# Patient Record
Sex: Male | Born: 1940 | Race: White | Hispanic: No | Marital: Married | Smoking: Never smoker
Health system: Southern US, Community
[De-identification: ages and names within clinical notes are randomized; demographics above are authoritative.]

## PROBLEM LIST (undated history)

## (undated) DIAGNOSIS — I619 Nontraumatic intracerebral hemorrhage, unspecified: Secondary | ICD-10-CM

## (undated) DIAGNOSIS — I639 Cerebral infarction, unspecified: Secondary | ICD-10-CM

## (undated) DIAGNOSIS — F329 Major depressive disorder, single episode, unspecified: Secondary | ICD-10-CM

## (undated) DIAGNOSIS — E119 Type 2 diabetes mellitus without complications: Secondary | ICD-10-CM

## (undated) DIAGNOSIS — F32A Depression, unspecified: Secondary | ICD-10-CM

## (undated) DIAGNOSIS — I10 Essential (primary) hypertension: Secondary | ICD-10-CM

## (undated) DIAGNOSIS — C801 Malignant (primary) neoplasm, unspecified: Secondary | ICD-10-CM

## (undated) DIAGNOSIS — E7800 Pure hypercholesterolemia, unspecified: Secondary | ICD-10-CM

## (undated) HISTORY — PX: PROSTATECTOMY: SHX69

## (undated) HISTORY — DX: Cerebral infarction, unspecified: I63.9

## (undated) HISTORY — PX: COLON SURGERY: SHX602

## (undated) HISTORY — PX: EYE SURGERY: SHX253

---
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
## 1970-02-22 HISTORY — PX: OTHER SURGICAL HISTORY: SHX169

## 1998-12-25 ENCOUNTER — Encounter (INDEPENDENT_AMBULATORY_CARE_PROVIDER_SITE_OTHER): Payer: Self-pay | Admitting: *Deleted

## 1999-01-14 ENCOUNTER — Encounter: Payer: Self-pay | Admitting: Family Medicine

## 1999-01-14 ENCOUNTER — Ambulatory Visit (HOSPITAL_COMMUNITY): Admission: RE | Admit: 1999-01-14 | Discharge: 1999-01-14 | Payer: Self-pay | Admitting: Family Medicine

## 2000-09-03 ENCOUNTER — Emergency Department (HOSPITAL_COMMUNITY): Admission: EM | Admit: 2000-09-03 | Discharge: 2000-09-03 | Payer: Self-pay

## 2002-03-29 ENCOUNTER — Encounter: Payer: Self-pay | Admitting: Internal Medicine

## 2009-06-18 ENCOUNTER — Inpatient Hospital Stay (HOSPITAL_COMMUNITY): Admission: EM | Admit: 2009-06-18 | Discharge: 2009-06-24 | Payer: Self-pay | Admitting: Internal Medicine

## 2009-06-18 ENCOUNTER — Encounter (INDEPENDENT_AMBULATORY_CARE_PROVIDER_SITE_OTHER): Payer: Self-pay | Admitting: *Deleted

## 2009-06-18 ENCOUNTER — Encounter: Payer: Self-pay | Admitting: Emergency Medicine

## 2009-06-18 ENCOUNTER — Ambulatory Visit: Payer: Self-pay | Admitting: Diagnostic Radiology

## 2009-06-23 ENCOUNTER — Encounter (INDEPENDENT_AMBULATORY_CARE_PROVIDER_SITE_OTHER): Payer: Self-pay | Admitting: *Deleted

## 2009-06-24 ENCOUNTER — Encounter (INDEPENDENT_AMBULATORY_CARE_PROVIDER_SITE_OTHER): Payer: Self-pay | Admitting: *Deleted

## 2009-07-09 ENCOUNTER — Encounter (INDEPENDENT_AMBULATORY_CARE_PROVIDER_SITE_OTHER): Payer: Self-pay | Admitting: *Deleted

## 2009-07-09 ENCOUNTER — Encounter: Admission: RE | Admit: 2009-07-09 | Discharge: 2009-07-09 | Payer: Self-pay | Admitting: Surgery

## 2009-07-11 ENCOUNTER — Encounter: Payer: Self-pay | Admitting: Internal Medicine

## 2009-07-28 ENCOUNTER — Telehealth: Payer: Self-pay | Admitting: Internal Medicine

## 2009-07-29 DIAGNOSIS — Z860100 Personal history of colon polyps, unspecified: Secondary | ICD-10-CM | POA: Insufficient documentation

## 2009-07-29 DIAGNOSIS — I10 Essential (primary) hypertension: Secondary | ICD-10-CM | POA: Insufficient documentation

## 2009-07-29 DIAGNOSIS — E785 Hyperlipidemia, unspecified: Secondary | ICD-10-CM | POA: Insufficient documentation

## 2009-07-29 DIAGNOSIS — J90 Pleural effusion, not elsewhere classified: Secondary | ICD-10-CM | POA: Insufficient documentation

## 2009-07-29 DIAGNOSIS — R17 Unspecified jaundice: Secondary | ICD-10-CM | POA: Insufficient documentation

## 2009-07-29 DIAGNOSIS — K5732 Diverticulitis of large intestine without perforation or abscess without bleeding: Secondary | ICD-10-CM | POA: Insufficient documentation

## 2009-07-30 ENCOUNTER — Ambulatory Visit: Payer: Self-pay | Admitting: Internal Medicine

## 2009-08-01 ENCOUNTER — Ambulatory Visit: Payer: Self-pay | Admitting: Internal Medicine

## 2009-08-04 ENCOUNTER — Encounter: Payer: Self-pay | Admitting: Internal Medicine

## 2009-08-04 ENCOUNTER — Telehealth: Payer: Self-pay | Admitting: Internal Medicine

## 2009-08-08 ENCOUNTER — Ambulatory Visit: Payer: Self-pay | Admitting: Cardiology

## 2009-08-29 ENCOUNTER — Encounter: Payer: Self-pay | Admitting: Internal Medicine

## 2010-03-26 NOTE — Progress Notes (Signed)
Summary: CT Scheduled  Phone Note Outgoing Call   Call placed by: Laureen Ochs LPN,  August 04, 2009 10:10 AM Call placed to: Patient Summary of Call: Per Dr.Kamren Heintzelman/colonoscopy report on 08-01-09, pt. needs a CT. CT scheduled at [facility] CT on 08-06-09 at 9am. Message left for patient to callback.  Initial call taken by: Laureen Ochs LPN,  August 04, 2009 10:23 AM  Follow-up for Phone Call        Above appt. information reviewed w/pt. by phone, he has the instruction sheet and contrast that was given to him after colonoscopy on 08-01-09. All instructions reviewed, no questions at this time. Pt. instructed to call back as needed.  Follow-up by: Laureen Ochs LPN,  August 04, 2009 4:19 PM     Appended Document: CT Scheduled Pt's CT has been rescheduled to 08-08-09 at 9:30am, updated w/Mrs.Wiegel by phone. Pt. instructed to call back as needed.

## 2010-03-26 NOTE — Letter (Signed)
Summary: [facility]   Imported By: Lester [facility] 09/12/2009 08:10:23  _____________________________________________________________________  External Attachment:    Type:   Image     Comment:   External Document

## 2010-03-26 NOTE — Assessment & Plan Note (Signed)
Summary: ABD. PAIN/BLACK STOOLS               DEBORAH   History of Present Illness Visit Type: new patient  Primary GI MD: Lina Sar MD Primary Provider: Marjory Lies, MD  Requesting Provider: na Chief Complaint: Right side abd pain, black tarry stools History of Present Illness:   70 yo Wmale with hx of recent acute abd. pain and inflammatory ptocess in the RLQ extending to the periduodenal area. He was hospitalized from4/27-06/24/2009  and was D/C'ed on Augmentin 875 mg by mouth two times a day x 11 days. Suspected diagnosis appendicitis, colitis, ? diverticulitis .On his last colonoscopy in 2005 no diverticuli were mentioned. He is seing Dr Daphine Deutscher in follow up and Dr Daphine Deutscher suggested to proceed with colonoscopy to evaluate the resolcing inflammation. He will also need a repeat CT scan of the abdomen at some point.   GI Review of Systems    Reports abdominal pain.     Location of  Abdominal pain: right side.    Denies acid reflux, belching, bloating, chest pain, dysphagia with liquids, dysphagia with solids, heartburn, loss of appetite, nausea, vomiting, vomiting blood, weight loss, and  weight gain.      Reports black tarry stools.     Denies anal fissure, change in bowel habit, constipation, diarrhea, diverticulosis, fecal incontinence, heme positive stool, hemorrhoids, irritable bowel syndrome, jaundice, light color stool, liver problems, rectal bleeding, and  rectal pain. Visit Type:  new patient  Referring Provider:  na Primary Care Provider:  Marjory Lies, MD   Chief Complaint:  Right side abd pain and black tarry stools.  History of Present Illness: This is a 70 year old male who comes today at the request of Dr Luretha Murphy for consideration of colonoscopy. Patient was hospitalized in late April 2011 for right lower quadrant abdominal pain. It was episodic and colicky in nature. He had a poor appetite; and, there was no relation to food. He also had some vomiting. A CT scan  of the abdomen and pelvis completed on the day of admission showed acute inflammation in the ascending colon region with several extraluminal gas bubbles. It was thought to be most likely related to diverticulitis with a perforated tic. In addition, there was thickening of the second and third portion of the duodenum which was thought to be due to duodenitis or peptic ulcer disease and related to diverticulitis. Labwork completed at the time of admission showed a lactic acid of 1.7. His INR esd 1.2, ssodium was 135, potassium 3.7, chloride 96, bicarb 28, glucose 184, BUN of 17, creatinine of 0.8, bilirubin 4.4 and the rest of LFTs within normal limits.  His lipase was 30. His white count was 16.2. His hemoglobin was 14.9. Patient's platelet count was 184 and his ANC was 14.6. Patient was started on IV Zosyn. A follow up CT scan completed on 06/23/09 showed a continued inflammatory process within the right lower quadrant. It also showed increasing extraluminal gas and fluid collections compatible with developing abscesses. There was questionable extravasated contrast lateral to the cecum and ascending colon. There was also decreasing wall thickness of the duodenum and a small right pleural effusion. Patient was put on oral Augmentin which he was on for a total of 14 days. Another repeat CT scan completed on 07/09/09 after discharge from the hospital showed iterval improvement in the inflammatory process within the right lower quadrant. There were still a few extraluminal air bubbles present within the right lower  quadrant. Patient comes today in follow up and is now doing better with the exception of some residual abdominal discomfort.    Current Medications (verified): 1)  Pravastatin Sodium 20 Mg Tabs (Pravastatin Sodium) .... Take 1 Tablet By Mouth Once A Day  Allergies (verified): No Known Drug Allergies  Past History:  Past Medical History: Reviewed history from 07/29/2009 and no changes  required. Current Problems:  Hx of EFFUSION, PLEURAL (ICD-511.9) HYPERLIPIDEMIA (ICD-272.4) HYPERBILIRUBINEMIA (ICD-782.4) DIVERTICULITIS, COLON (ICD-562.11) HYPERTENSION (ICD-401.9) COLONIC POLYPS, HYPERPLASTIC, HX OF (ICD-V12.72)    Past Surgical History: Reviewed history from 07/29/2009 and no changes required. Left wrist surgery Nasal Surgery  Family History: Reviewed history from 07/29/2009 and no changes required. No FH of Colon Cancer: Family History of Diabetes:   Social History: Self Employed Married One child Alcohol Use - no Illicit Drug Use - no Patient has never smoked.  Daily Caffeine Use: 3 daily  Smoking Status:  never  Review of Systems  The patient denies allergy/sinus, anemia, anxiety-new, arthritis/joint pain, back pain, blood in urine, breast changes/lumps, change in vision, confusion, cough, coughing up blood, depression-new, fainting, fatigue, fever, headaches-new, hearing problems, heart murmur, heart rhythm changes, itching, menstrual pain, muscle pains/cramps, night sweats, nosebleeds, pregnancy symptoms, shortness of breath, skin rash, sleeping problems, sore throat, swelling of feet/legs, swollen lymph glands, thirst - excessive , urination - excessive , urination changes/pain, urine leakage, vision changes, and voice change.         Pertinent positive and negative review of systems were noted in the above HPI. All other ROS was otherwise negative.   Vital Signs:  Patient profile:   70 year old male Height:      72 inches Weight:      240 pounds BMI:     32.67 BSA:     2.30 Pulse rate:   76 / minute Pulse rhythm:   regular BP sitting:   142 / 86  (left arm) Cuff size:   regular  Vitals Entered By: Ok Anis CMA (July 30, 2009 9:14 AM)  Physical Exam  General:  aler,t oriented and overweight. Eyes:  nonicteric. Neck:  jugular veins not distended. Lungs:  Clear throughout to auscultation. Heart:  Regular rate and rhythm; no murmurs,  rubs,  or bruits. Abdomen:  protuberant abdomen with normoactive bowel sounds. Small umbilical hernia. No tenderness. No fullness. No fluid wave. Liver edge not felt. Extremities:  2+ edema bilaterally. Skin:  Intact without significant lesions or rashes. Psych:  Alert and cooperative. Normal mood and affect.   Impression & Recommendations:  Problem # 1:  DIVERTICULITIS, COLON (ICD-562.11) Patient had an acute abdominal infection of unclear etiology; appendicitis versus diverticulitis. There is also the possibility that the infection was related to right-sided colitis. There was an extension of inflammation to the periduodenal area. Transient jaundice might have been due to metabolic causes but gallbladder disease needs to be ruled out as well. His gallbladder appeared contracted on a CT scan. He is currently asymptomatic and off antibiotics. As per Dr. Daphine Deutscher, we will proceed with a screening colonoscopy to assess the right colon. We will also repeat his liver function tests today as well as his CBC. We will consider an upper abdominal ultrasound or HIDA scan for further evaluation as well.  Problem # 2:  HYPERBILIRUBINEMIA (ICD-782.4) We will repeat LFTs today.  Problem # 3:  COLONIC POLYPS, HYPERPLASTIC, HX OF (ICD-V12.72) A followup colonoscopy has been scheduled. Orders: Colonoscopy (Colon)  Patient Instructions: 1)  scheduled colonoscopy  with MiraLax prep. 2)  High-fiber diet. 3)  CBC and metabolic panel today. 4)  Consider upper abdominal ultrasound and HIDA scan. 5)  Copy sent to : Dr M.Martin, Dr Erven Colla 6)  The medication list was reviewed and reconciled.  All changed / newly prescribed medications were explained.  A complete medication list was provided to the patient / caregiver. Prescriptions: DULCOLAX 5 MG  TBEC (BISACODYL) Day before procedure take 2 at 3pm and 2 at 8pm.  #4 x 0   Entered by:   Lamona Curl CMA (AAMA)   Authorized by:   Hart Carwin MD    Signed by:   Lamona Curl CMA (AAMA) on 07/30/2009   Method used:   Electronically to        [facility]  [place] [address] (retail)       [address]       [place], [place]  [address]       Ph: [phone] or [phone]       Fax: [phone]   RxID:   [id number] REGLAN 10 MG  TABS (METOCLOPRAMIDE HCL) As per prep instructions.  #2 x 0   Entered by:   Lamona Curl CMA (AAMA)   Authorized by:   Hart Carwin MD   Signed by:   Lamona Curl CMA (AAMA) on 07/30/2009   Method used:   [facility]ronically to        [facility]  [place] [address] (retail)       [address]       [place], [place]  [address]       Ph: [phone] or [phone]       Fax: [phone]   RxID:   [id number] MIRALAX   POWD (POLYETHYLENE GLYCOL 3350) As per prep  instructions.  #255gm x 0   Entered by:   Lamona Curl CMA (AAMA)   Authorized by:   Hart Carwin MD   Signed by:   Lamona Curl CMA (AAMA) on 07/30/2009   Method used:   [facility]ronically to        [facility]  [place] [address] (retail[facility]       [address]       [place], [place]  [address]       Ph: [phone] or [phone]       Fax: [phone]   RxID:   [id number]

## 2010-03-26 NOTE — Discharge Summary (Signed)
Summary: Acute Diverticulitis with Abscess    NAME:  Justin [name], Justin [name]                 ACCOUNT NO.:  [id number]      MEDICAL RECORD NO.:  [id number]          PATIENT TYPE:  INP      LOCATION:  1525                         FACILITY:  [facility]      PHYSICIAN:  Marinda Elk, M.D.DATE OF BIRTH:  October 07, 1940      DATE OF ADMISSION:  06/18/2009   DATE OF DISCHARGE:  06/24/2009                                  DISCHARGE SUMMARY      PRIMARY CARE DOCTOR:  Dr. Marjory Lies at [facility].      SURGEON:  Dr. Daphine Deutscher.      DISCHARGE DIAGNOSES:   1. Acute diverticulitis with abscess.   2. Hypertension.   3. Hyperbilirubinemia.      DISCHARGE MEDICATIONS:   1. Augmentin 875 mg 1 tablet b.i.d. for 11 more days.   2. Pravastatin 20 mg p.o. daily.      PROCEDURES PERFORMED:   1. On Jun 23, 2009, CT scan of the abdomen showed continued       inflammatory process in the right lower quadrant, increasing       intraluminal gas and fluid collection, compatible with developing       abscess.   2. CT scan of the abdomen and pelvis on June 18, 2009, showed acute       inflammatory ascending colon region that is most likely       diverticulitis.      BRIEF ADMITTING HISTORY AND PHYSICAL:  This is a 70 year old male with   past medical history of hyperlipidemia, who presented to the ER   complaining of pain.  The patient stated that he has had abdominal pain   for the last 4 days, which started Sunday in the right lower quadrant   that is episodic in nature and colicky.  He has had a poor appetite.  He   has only eaten and drank minimally for the past 4 days.  He did throw up   a total of three times, but that has gotten better.  He has had loose   stools for the past 2 days.  He was started yesterday on antibiotic by   his primary care physician and before that he was not on any antibiotic.   In addition, the patient states that his urine has become orange.  So he   comes to the ED.   The ED consultants will further evaluate him.      PHYSICAL EXAMINATION:  VITAL SIGNS:  Blood pressure 160/70, pulse of 99,   respiration 18.  He was saturating 97% on room air.   HEENT:  No pallor.  Dry mucous membranes.  He has cleared jaundice.  No   discharge from his eyes or ears.  Tongue is midline and dry.   CHEST:  Good air movement.  Clear to auscultation.   CARDIOVASCULAR:  Regular rate and rhythm with a positive S1-S2.  No   murmurs, rubs or gallops.   LUNGS:  He has good air movement and  clear to auscultation.   ABDOMEN:  Positive bowel sounds.  He is mildly tender in the right lower   quadrant, right upper quadrant and left upper quadrant.  No guarding or   rigidity.  NEUROLOGICAL:  Complete nonfocal.      LABORATORY DATA:  On admission, showed lactic acid of 1.7.  His INR is   1.2.  His sodium was 135, potassium 3.7, chloride 96, bicarb 28, glucose   of 184, BUN of 17, creatinine of 0.8, bilirubin 4.4 and the rest of LFTs   within normal.  Lipase of 30.  His white count was 16.2.  His hemoglobin   was 14.9.  His platelet count 184.  His ANC is 14.6.  His UA showed 0-2   red blood cells and white blood cells.  CT scan results above.      HOSPITAL COURSE:   1. Acute diverticulitis with developing abscess.  He was admitted to       the floor.  He was started on IV antibiotic Zosyn.  Surgery saw       him.  As the patient was improving with IV antibiotics and       hydration, they recommended to follow closely.  A repeat CT scan       was done with results as above.  He will continue antibiotics for       11 more days.  Surgery recommended to follow up with him as an       outpatient.  The patient was eating and drinking and walking       around, feeling better.  So he was discharged in stable condition.   2. Hypertension, currently slightly high.  The patient will have to       follow-up with his primary care doctor.   3. Hyperbilirubinemia.  This is probably secondary to  vomiting and not       eating.  This decreased with IV hydration and no treatment.      DISPOSITION:  The patient will follow-up with his primary care doctor in   2 weeks.  Here, we will check his blood pressure and start blood   pressure medication if needed.  He will also follow up with Dr. Daphine Deutscher   on Jul 04, 2009, at 1:50 p.m. to follow-up on this episode.      VITALS ON DAY OF DISCHARGE:  Temperature 98, pulse of 89, respiration   20, blood pressure 162/82.  He was saturating 98% on room air.      LABORATORY DATA ON DISCHARGE:  Acute hepatitis panel was negative.               Marinda Elk, M.D.            AF/MEDQ  D:  06/24/2009  T:  06/24/2009  Job:  [id number]      cc:   Marjory Lies, M.D.   Fax: [phone]      Daphine Deutscher, MD      Electronically Signed by Lambert Keto M.D. on 06/24/2009 02:50:59 PM

## 2010-03-26 NOTE — Letter (Signed)
Summary: [facility] Surgery   Imported By: Sherian Rein 07/30/2009 09:24:02  _____________________________________________________________________  External Attachment:    Type:   Image     Comment:   External Document

## 2010-03-26 NOTE — Procedures (Signed)
Summary: Colonoscopy  Patient: Justin [name] Note: All result statuses are Final unless otherwise noted.  Tests: (1) Colonoscopy (COL)   COL Colonoscopy           DONE     [facility]     [address]     [place], [place]  [address]           COLONOSCOPY PROCEDURE REPORT           PATIENT:  Justin [name], Justin [name]  MR#:  [id number]     BIRTHDATE:  Nov 28, 1940, 68 yrs. old  GENDER:  male     ENDOSCOPIST:  Hedwig Morton. Juanda Chance, MD     REF. BY:  Luretha Murphy, M.D.     PROCEDURE DATE:  08/01/2009     PROCEDURE:  Colonoscopy 41324     ASA CLASS:  Class II     INDICATIONS:  s/p acute inflammatory process in RLQ, CT scan with     question of perforated appendix, diverticulum or colitis, s/p IV     and oral antibiotics     last colon 2005 was normal     MEDICATIONS:   Versed 6 mg, Fentanyl 75 mcg           DESCRIPTION OF PROCEDURE:   After the risks benefits and     alternatives of the procedure were thoroughly explained, informed     consent was obtained.  Digital rectal exam was performed and     revealed no rectal masses.   The LB [id number] endoscope     was introduced through the anus and advanced to the ileum, without     limitations.  The quality of the prep was good, using MiraLax.     The instrument was then slowly withdrawn as the colon was fully     examined.     <<PROCEDUREIMAGES>>           FINDINGS:  Nodular mucosa was found in the cecum. small area of     nodularity distal to the ileocecal valve appears fiable and bleeds     easily when biopsies, it consists of granulation tissue,     the ileocecal valve and TI are normal, With standard forceps,     biopsy was obtained and sent to pathology (see image8 and     image10).  This was otherwise a normal examination of the colon     (see image11, image7, image6, image5, image4, image3, image1, and     image2). no diverticuli left or right colon   Retroflexed views in     the rectum revealed no abnormalities.    The scope  was then     withdrawn from the patient and the procedure completed.           COMPLICATIONS:  None     ENDOSCOPIC IMPRESSION:     1) Nodular mucosa in the cecum     2) Otherwise normal examination     probable site of perforation or abcess just distal from the     ileocecal valve, healed ulcerated mucosa, no evidence of colitis,     Ileocecal valve and TI are normal as well as an appendiceal     opening     RECOMMENDATIONS:     1) Await biopsy results     follow up CT scan of the abdomen     follow up with Dr Daphine Deutscher     REPEAT EXAM:  In 10 year(s) for.  ______________________________     Hedwig Morton. Juanda Chance, MD           CC:           n.     eSIGNED:   Hedwig Morton. Shunte Senseney at 08/01/2009 10:06 AM           Page 2 of 3   [place], [place], [id number]  Note: An exclamation mark (!) indicates a result that was not dispersed into the flowsheet. Document Creation Date: 08/01/2009 10:07 AM _______________________________________________________________________  (1) Order result status: Final Collection or observation date-time: 08/01/2009 09:50 Requested date-time:  Receipt date-time:  Reported date-time:  Referring Physician:   Ordering Physician: Lina Sar [phone]) Specimen Source:  Source: Launa Grill Order Number: [id number] Lab site:   Appended Document: Colonoscopy     Procedures Next Due Date:    Colonoscopy: 07/2019

## 2010-03-26 NOTE — Letter (Signed)
Summary: Patient Notice- Polyp Results  [facility]  [address] [place], [place] [address]   Phone: [phone]  Fax: [phone]        August 04, 2009 MRN: [id number]    Justin [name] [address] [place], [place]  [address]    Dear [name],  I am pleased to inform you that the colon polyp(s) removed during your recent colonoscopy was (were) found to be benign (no cancer detected) upon pathologic examination.The polyp consisted of inflamed tissue consistent with a healing process.  I recommend you have a repeat colonoscopy examination in 10_ years to look for recurrent polyps, as having colon polyps increases your risk for having recurrent polyps or even colon cancer in the future.  Should you develop new or worsening symptoms of abdominal pain, bowel habit changes or bleeding from the rectum or bowels, please schedule an evaluation with either your primary care physician or with me.  Additional information/recommendations:  __ No further action with gastroenterology is needed at this time. Please      follow-up with your primary care physician for your other healthcare      needs.  __ Please call [phone] to schedule a return visit to review your      situation.  _x_ Please keep your follow-up visit with Dr Daphine Deutscher Please  keep Your appointment for CT scan. __ Continue treatment plan as outlined the day of your exam.  Please call us if you are having persistent problems or have questions about your condition that have not been fully answered at this time.  Sincerely,  Hart Carwin MD  This letter has been electronically signed by your physician.  Appended Document: Patient Notice- Polyp Results letter mailed 6.16.11

## 2010-03-26 NOTE — Letter (Signed)
Summary: [facility] Instructions  [facility]  [address] [place], [place] [address]   Phone: [phone]  Fax: [phone]       Justin [name]    Dec 04, 1940    MRN: [id number]       Procedure Day /Date: 08/01/09 Friday     Arrival Time: 8:00 am     Procedure Time: 9:00 am     Location of Procedure:                    _x_  [facility] (4th Floor)  PREPARATION FOR COLONOSCOPY WITH MIRALAX  Starting 5 days prior to your procedure (today) do not eat nuts, seeds, popcorn, corn, beans, peas,  salads, or any raw vegetables.  Do not take any fiber supplements (e.g. Metamucil, Citrucel, and Benefiber). ____________________________________________________________________________________________________   THE DAY BEFORE YOUR PROCEDURE         DATE: 07/31/09 DAY: Thursday  1   Drink clear liquids the entire day-NO SOLID FOOD  2   Do not drink anything colored red or purple.  Avoid juices with pulp.  No orange juice.  3   Drink at least 64 oz. (8 glasses) of fluid/clear liquids during the day to prevent dehydration and help the prep work efficiently.  CLEAR LIQUIDS INCLUDE: Water Jello Ice Popsicles Tea (sugar ok, no milk/cream) Powdered fruit flavored drinks Coffee (sugar ok, no milk/cream) Gatorade Juice: apple, white grape, white cranberry  Lemonade Clear bullion, consomm, broth Carbonated beverages (any kind) Strained chicken noodle soup Hard Candy  4   Mix the entire bottle of Miralax with 64 oz. of Gatorade/Powerade in the morning and put in the refrigerator to chill.  5   At 3:00 pm take 2 Dulcolax/Bisacodyl tablets.  6   At 4:30 pm take one Reglan/Metoclopramide tablet.  7  Starting at 5:00 pm drink one 8 oz glass of the Miralax mixture every 15-20 minutes until you have finished drinking the entire 64 oz.  You should finish drinking prep around 7:30 or 8:00 pm.  8   If you are nauseated, you may take the 2nd Reglan/Metoclopramide tablet at  6:30 pm.        9    At 8:00 pm take 2 more DULCOLAX/Bisacodyl tablets.         THE DAY OF YOUR PROCEDURE      DATE:  08/01/09 DAY: Friday  You may drink clear liquids until 7:00 am  (2 HOURS BEFORE PROCEDURE).   MEDICATION INSTRUCTIONS  Unless otherwise instructed, you should take regular prescription medications with a small sip of water as early as possible the morning of your procedure.       OTHER INSTRUCTIONS  You will need a responsible adult at least 68 years of age to accompany you and drive you home.   This person must remain in the waiting room during your procedure.  Wear loose fitting clothing that is easily removed.  Leave jewelry and other valuables at home.  However, you may wish to bring a book to read or an iPod/MP3 player to listen to music as you wait for your procedure to start.  Remove all body piercing jewelry and leave at home.  Total time from sign-in until discharge is approximately 2-3 hours.  You should go home directly after your procedure and rest.  You can resume normal activities the day after your procedure.  The day of your procedure you should not:   Drive   Make legal  decisions   Operate machinery   Drink alcohol   Return to work  You will receive specific instructions about eating, activities and medications before you leave.   The above instructions have been reviewed and explained to me by   _______________________    I fully understand and can verbalize these instructions _____________________________ Date 07/30/09

## 2010-03-26 NOTE — Progress Notes (Signed)
Summary: Appt. Scheduled  Phone Note Outgoing Call   Call placed by: Laureen Ochs LPN,  July 28, 1608 10:14 AM Call placed to: Patient Summary of Call: Per Dr.Mat Martin's request/Dr.Lakendrick Paradis, pt. needs a colonoscopy. Message left for patient to callback.  Initial call taken by: Laureen Ochs LPN,  July 29, 9602 10:15 AM  Follow-up for Phone Call        Pt. was at [facility] last month for black stools/abd. pain.  He will see Dr.Briane Birden on 07-30-09 at 8:45am. Follow-up by: Laureen Ochs LPN,  July 30, 5407 8:13 AM

## 2010-03-26 NOTE — Procedures (Signed)
Summary: COLONOSCOPY   Colonoscopy  Procedure date:  03/29/2002  Findings:      Location:  [facility].    Patient Name: Justin [name], Justin [name] MRN:  Procedure Procedures: Colonoscopy CPT: 21308.    with Hot Biopsy(s)CPT: Z451292.  Personnel: Endoscopist: Dora L. Juanda Chance, MD.  Referred By: Corinna Capra, MD.  Exam Location: Exam performed in Outpatient Clinic. Outpatient  Patient Consent: Procedure, Alternatives, Risks and Benefits discussed, consent obtained, from patient. Consent was obtained by the RN.  Indications  Average Risk Screening Routine.  History  Pre-Exam Physical: Performed Mar 29, 2002. Cardio-pulmonary exam, Rectal exam, HEENT exam , Abdominal exam, Extremity exam, Neurological exam, Mental status exam WNL.  Exam Exam: Extent of exam reached: Cecum, extent intended: Cecum.  The cecum was identified by appendiceal orifice and IC valve. Colon retroflexion performed. Images taken. ASA Classification: I. Tolerance: good.  Monitoring: Pulse and BP monitoring, Oximetry used. Supplemental O2 given.  Sedation Meds: Patient assessed and found to be appropriate for moderate (conscious) sedation. Fentanyl 100 mcg. given IV. Versed 9 mg. given IV.  Findings POLYP: Sigmoid Colon, Maximum size: 4 mm. diminutive, sessile polyp. Distance from Anus 45 cm. Procedure:  hot biopsy, removed, retrieved, Polyp sent to pathology. ICD9: Colon Polyps: 211.3.   Assessment Abnormal examination, see findings above.  Diagnoses: 211.3: Colon Polyps.   Comments: s/p polypectomy Events  Unplanned Interventions: No intervention was required.  Unplanned Events: There were no complications. Plans  Post Exam Instructions: No aspirin or non-steroidal containing medications: 2 weeks.  Medication Plan: Await pathology.  Patient Education: Patient given standard instructions for: Yearly hemoccult testing recommended. Patient instructed to get routine colonoscopy  every 5 years.  Disposition: After procedure patient sent to recovery. After recovery patient sent home.       CC: Kipp Brood A.Burnette,MD  This report was created from the original endoscopy report, which was reviewed and signed by the above listed endoscopist.   Appended Document: COLONOSCOPY Path report : Hyperplastic polyp

## 2010-04-06 ENCOUNTER — Ambulatory Visit (HOSPITAL_BASED_OUTPATIENT_CLINIC_OR_DEPARTMENT_OTHER)
Admission: RE | Admit: 2010-04-06 | Discharge: 2010-04-06 | Disposition: A | Discharge status: Home / Self Care | Payer: Medicare Other | Source: Ambulatory Visit | Attending: Urology | Admitting: Urology

## 2010-04-06 DIAGNOSIS — C61 Malignant neoplasm of prostate: Secondary | ICD-10-CM | POA: Insufficient documentation

## 2010-04-06 DIAGNOSIS — Z01810 Encounter for preprocedural cardiovascular examination: Secondary | ICD-10-CM | POA: Insufficient documentation

## 2010-04-06 DIAGNOSIS — N210 Calculus in bladder: Secondary | ICD-10-CM | POA: Insufficient documentation

## 2010-04-06 DIAGNOSIS — N35919 Unspecified urethral stricture, male, unspecified site: Secondary | ICD-10-CM | POA: Insufficient documentation

## 2010-04-06 DIAGNOSIS — Z01812 Encounter for preprocedural laboratory examination: Secondary | ICD-10-CM | POA: Insufficient documentation

## 2010-04-06 LAB — POCT HEMOGLOBIN-HEMACUE: Hemoglobin: 16.5 g/dL (ref 13.0–17.0)

## 2010-04-13 NOTE — Op Note (Signed)
  NAMEJALEEL, ALLEN NO.:  [id number]  MEDICAL RECORD NO.:  [id number]          PATIENT TYPE:  LOCATION:                                 FACILITY:  PHYSICIAN:  Valetta Fuller, M.D.  DATE OF BIRTH:  03-10-1940  DATE OF PROCEDURE: DATE OF DISCHARGE:                              OPERATIVE REPORT   PREOPERATIVE DIAGNOSES: 1. Urethral stricture disease. 2. Prostate cancer.  POSTOPERATIVE DIAGNOSES: 1. Urethral stricture disease. 2. Prostate cancer. 3. Bladder calculus.  PROCEDURE PERFORMED:  Cystoscopy, balloon dilation of urethral stricture, and removal of bladder stone.  SURGEON:  Valetta Fuller, MD  ANESTHESIA:  General.  INDICATIONS:  Mr. Harker is 70 years of age.  He has a remote history of urethral stricture disease after developing near urinary retention.  At that time, he was felt to have a very tight but short bulbar urethral stricture which was dilated in the office with good results.  The patient recently had an elevated PSA.  He was complaining of increased voiding difficulties.  He underwent a biopsy which unfortunately did reveal adenocarcinoma of the prostate.  The patient subsequently developed near urinary retention and required minimal urethral dilation in the office with Foley catheter placement.  He presents now for reassessment along with more formal dilation of his bulbar urethral stricture and assessment of his prostate and bladder.  TECHNIQUE AND FINDINGS:  The patient was brought to the operating room. His Foley catheter was removed.  He has been taking oral antibiotics starting several days before the procedure.  He had successful induction of general anesthesia, was placed in lithotomy position and prepped and draped in the usual manner.  Appropriate surgical time-out was performed.  Cystoscopy revealed narrowing in his bulbar urethra.  This was approximately 2 cm distal to his external urethral sphincter.  This area  appeared to be quite short in length.  With fluoroscopic guidance, we placed a guidewire through this area to the bladder.  A 24-French fascia dilating balloon was then inflated to 16 atmospheres of pressure for approximately 5 minutes.  The balloon was then removed.  Cystoscopy then revealed good resolution of the area of narrowing and easily allow for passes of the cystoscope.  The prostatic urethra was relatively short measuring 3.5 cm with some mild trilobar hyperplasia but not much in a way of visual obstruction.  The bladder showed moderate trabecular change and approximately a 5-6 mm stone which was removed.  We felt the patient did not require repeat Foley catheter insertion and lidocaine jelly was instilled and he was brought to recovery room in stable condition.     Valetta Fuller, M.D.     DSG/MEDQ  D:  04/06/2010  T:  04/06/2010  Job:  [id number]  Electronically Signed by Barron Alvine M.D. on 04/13/2010 12:02:42 PM

## 2010-05-12 LAB — DIFFERENTIAL
Basophils Absolute: 0 10*3/uL (ref 0.0–0.1)
Basophils Relative: 0 % (ref 0–1)
Eosinophils Absolute: 0 10*3/uL (ref 0.0–0.7)
Eosinophils Relative: 0 % (ref 0–5)
Lymphocytes Relative: 5 % — ABNORMAL LOW (ref 12–46)
Lymphs Abs: 0.8 10*3/uL (ref 0.7–4.0)
Monocytes Absolute: 0.8 10*3/uL (ref 0.1–1.0)
Monocytes Relative: 5 % (ref 3–12)
Neutro Abs: 14.6 10*3/uL — ABNORMAL HIGH (ref 1.7–7.7)
Neutrophils Relative %: 90 % — ABNORMAL HIGH (ref 43–77)

## 2010-05-12 LAB — CBC
HCT: 37 % — ABNORMAL LOW (ref 39.0–52.0)
HCT: 39.3 % (ref 39.0–52.0)
HCT: 40.4 % (ref 39.0–52.0)
HCT: 40.8 % (ref 39.0–52.0)
HCT: 43.5 % (ref 39.0–52.0)
Hemoglobin: 12.7 g/dL — ABNORMAL LOW (ref 13.0–17.0)
Hemoglobin: 13.3 g/dL (ref 13.0–17.0)
Hemoglobin: 13.7 g/dL (ref 13.0–17.0)
Hemoglobin: 14 g/dL (ref 13.0–17.0)
Hemoglobin: 14.9 g/dL (ref 13.0–17.0)
MCHC: 34.2 g/dL (ref 30.0–36.0)
MCHC: 33.9 g/dL (ref 30.0–36.0)
MCHC: 34 g/dL (ref 30.0–36.0)
MCHC: 34.4 g/dL (ref 30.0–36.0)
MCHC: 34.2 g/dL (ref 30.0–36.0)
MCV: 85 fL (ref 78.0–100.0)
MCV: 84.8 fL (ref 78.0–100.0)
MCV: 85.1 fL (ref 78.0–100.0)
MCV: 85.4 fL (ref 78.0–100.0)
MCV: 83.9 fL (ref 78.0–100.0)
Platelets: 300 10*3/uL (ref 150–400)
Platelets: 206 10*3/uL (ref 150–400)
Platelets: 240 10*3/uL (ref 150–400)
Platelets: 356 10*3/uL (ref 150–400)
Platelets: 184 10*3/uL (ref 150–400)
RBC: 4.36 MIL/uL (ref 4.22–5.81)
RBC: 4.6 MIL/uL (ref 4.22–5.81)
RBC: 4.77 MIL/uL (ref 4.22–5.81)
RBC: 4.79 MIL/uL (ref 4.22–5.81)
RBC: 5.19 MIL/uL (ref 4.22–5.81)
RDW: 12.8 % (ref 11.5–15.5)
RDW: 12.8 % (ref 11.5–15.5)
RDW: 12.9 % (ref 11.5–15.5)
RDW: 13.4 % (ref 11.5–15.5)
RDW: 12.3 % (ref 11.5–15.5)
WBC: 14.9 10*3/uL — ABNORMAL HIGH (ref 4.0–10.5)
WBC: 12.7 10*3/uL — ABNORMAL HIGH (ref 4.0–10.5)
WBC: 14.2 10*3/uL — ABNORMAL HIGH (ref 4.0–10.5)
WBC: 17.7 10*3/uL — ABNORMAL HIGH (ref 4.0–10.5)
WBC: 16.2 10*3/uL — ABNORMAL HIGH (ref 4.0–10.5)

## 2010-05-12 LAB — GLUCOSE, CAPILLARY
Glucose-Capillary: 104 mg/dL — ABNORMAL HIGH (ref 70–99)
Glucose-Capillary: 110 mg/dL — ABNORMAL HIGH (ref 70–99)
Glucose-Capillary: 117 mg/dL — ABNORMAL HIGH (ref 70–99)
Glucose-Capillary: 119 mg/dL — ABNORMAL HIGH (ref 70–99)
Glucose-Capillary: 120 mg/dL — ABNORMAL HIGH (ref 70–99)
Glucose-Capillary: 136 mg/dL — ABNORMAL HIGH (ref 70–99)
Glucose-Capillary: 145 mg/dL — ABNORMAL HIGH (ref 70–99)
Glucose-Capillary: 152 mg/dL — ABNORMAL HIGH (ref 70–99)
Glucose-Capillary: 160 mg/dL — ABNORMAL HIGH (ref 70–99)
Glucose-Capillary: 89 mg/dL (ref 70–99)
Glucose-Capillary: 92 mg/dL (ref 70–99)
Glucose-Capillary: 92 mg/dL (ref 70–99)
Glucose-Capillary: 99 mg/dL (ref 70–99)
Glucose-Capillary: 103 mg/dL — ABNORMAL HIGH (ref 70–99)
Glucose-Capillary: 106 mg/dL — ABNORMAL HIGH (ref 70–99)
Glucose-Capillary: 107 mg/dL — ABNORMAL HIGH (ref 70–99)
Glucose-Capillary: 110 mg/dL — ABNORMAL HIGH (ref 70–99)
Glucose-Capillary: 111 mg/dL — ABNORMAL HIGH (ref 70–99)
Glucose-Capillary: 112 mg/dL — ABNORMAL HIGH (ref 70–99)
Glucose-Capillary: 113 mg/dL — ABNORMAL HIGH (ref 70–99)
Glucose-Capillary: 114 mg/dL — ABNORMAL HIGH (ref 70–99)
Glucose-Capillary: 118 mg/dL — ABNORMAL HIGH (ref 70–99)
Glucose-Capillary: 119 mg/dL — ABNORMAL HIGH (ref 70–99)
Glucose-Capillary: 124 mg/dL — ABNORMAL HIGH (ref 70–99)
Glucose-Capillary: 156 mg/dL — ABNORMAL HIGH (ref 70–99)
Glucose-Capillary: 159 mg/dL — ABNORMAL HIGH (ref 70–99)
Glucose-Capillary: 162 mg/dL — ABNORMAL HIGH (ref 70–99)
Glucose-Capillary: 81 mg/dL (ref 70–99)
Glucose-Capillary: 83 mg/dL (ref 70–99)
Glucose-Capillary: 90 mg/dL (ref 70–99)
Glucose-Capillary: 93 mg/dL (ref 70–99)
Glucose-Capillary: 95 mg/dL (ref 70–99)

## 2010-05-12 LAB — HEPATITIS PANEL, ACUTE
HCV Ab: NEGATIVE
Hep A IgM: NEGATIVE
Hep B C IgM: NEGATIVE
Hepatitis B Surface Ag: NEGATIVE

## 2010-05-12 LAB — CULTURE, BLOOD (ROUTINE X 2)
Culture: NO GROWTH
Culture: NO GROWTH

## 2010-05-12 LAB — URINE CULTURE
Colony Count: NO GROWTH
Colony Count: NO GROWTH
Culture: NO GROWTH
Culture: NO GROWTH
Special Requests: NEGATIVE

## 2010-05-12 LAB — LACTIC ACID, PLASMA: Lactic Acid, Venous: 1.7 mmol/L (ref 0.5–2.2)

## 2010-05-12 LAB — URINALYSIS, ROUTINE W REFLEX MICROSCOPIC
Glucose, UA: NEGATIVE mg/dL
Ketones, ur: 15 mg/dL — AB
Nitrite: NEGATIVE
Protein, ur: 30 mg/dL — AB
Specific Gravity, Urine: 1.028 (ref 1.005–1.030)
Urobilinogen, UA: 4 mg/dL — ABNORMAL HIGH (ref 0.0–1.0)
pH: 6 (ref 5.0–8.0)

## 2010-05-12 LAB — COMPREHENSIVE METABOLIC PANEL
ALT: 34 U/L (ref 0–53)
ALT: 36 U/L (ref 0–53)
AST: 30 U/L (ref 0–37)
AST: 26 U/L (ref 0–37)
Albumin: 2.5 g/dL — ABNORMAL LOW (ref 3.5–5.2)
Albumin: 3.5 g/dL (ref 3.5–5.2)
Alkaline Phosphatase: 75 U/L (ref 39–117)
Alkaline Phosphatase: 90 U/L (ref 39–117)
BUN: 20 mg/dL (ref 6–23)
BUN: 17 mg/dL (ref 6–23)
CO2: 28 mEq/L (ref 19–32)
CO2: 28 mEq/L (ref 19–32)
Calcium: 8 mg/dL — ABNORMAL LOW (ref 8.4–10.5)
Calcium: 8.5 mg/dL (ref 8.4–10.5)
Chloride: 106 mEq/L (ref 96–112)
Chloride: 96 mEq/L (ref 96–112)
Creatinine, Ser: 0.79 mg/dL (ref 0.4–1.5)
Creatinine, Ser: 0.8 mg/dL (ref 0.4–1.5)
GFR calc Af Amer: >60 mL/min (ref >60)
GFR calc Af Amer: >60 mL/min (ref >60)
GFR calc non Af Amer: >60 mL/min (ref >60)
GFR calc non Af Amer: >60 mL/min (ref >60)
Glucose, Bld: 125 mg/dL — ABNORMAL HIGH (ref 70–99)
Glucose, Bld: 184 mg/dL — ABNORMAL HIGH (ref 70–99)
Potassium: 4.3 mEq/L (ref 3.5–5.1)
Potassium: 3.7 mEq/L (ref 3.5–5.1)
Sodium: 140 mEq/L (ref 135–145)
Sodium: 135 mEq/L (ref 135–145)
Total Bilirubin: 0.9 mg/dL (ref 0.3–1.2)
Total Bilirubin: 4.4 mg/dL — ABNORMAL HIGH (ref 0.3–1.2)
Total Protein: 6.3 g/dL (ref 6.0–8.3)
Total Protein: 7 g/dL (ref 6.0–8.3)

## 2010-05-12 LAB — STOOL CULTURE

## 2010-05-12 LAB — BASIC METABOLIC PANEL
BUN: 14 mg/dL (ref 6–23)
BUN: 16 mg/dL (ref 6–23)
CO2: 28 mEq/L (ref 19–32)
CO2: 29 mEq/L (ref 19–32)
Calcium: 8.1 mg/dL — ABNORMAL LOW (ref 8.4–10.5)
Calcium: 8.1 mg/dL — ABNORMAL LOW (ref 8.4–10.5)
Chloride: 101 mEq/L (ref 96–112)
Chloride: 105 mEq/L (ref 96–112)
Creatinine, Ser: 0.78 mg/dL (ref 0.4–1.5)
Creatinine, Ser: 0.81 mg/dL (ref 0.4–1.5)
GFR calc Af Amer: >60 mL/min (ref >60)
GFR calc Af Amer: >60 mL/min (ref >60)
GFR calc non Af Amer: >60 mL/min (ref >60)
GFR calc non Af Amer: >60 mL/min (ref >60)
Glucose, Bld: 112 mg/dL — ABNORMAL HIGH (ref 70–99)
Glucose, Bld: 148 mg/dL — ABNORMAL HIGH (ref 70–99)
Potassium: 3.6 mEq/L (ref 3.5–5.1)
Potassium: 3.8 mEq/L (ref 3.5–5.1)
Sodium: 135 mEq/L (ref 135–145)
Sodium: 139 mEq/L (ref 135–145)

## 2010-05-12 LAB — CLOSTRIDIUM DIFFICILE EIA
C difficile Toxins A+B, EIA: NEGATIVE
C difficile Toxins A+B, EIA: NEGATIVE

## 2010-05-12 LAB — OVA AND PARASITE EXAMINATION: Ova and parasites: NONE SEEN

## 2010-05-12 LAB — HEMOGLOBIN A1C
Hgb A1c MFr Bld: 6 % — ABNORMAL HIGH (ref <5.7)
Mean Plasma Glucose: 126 mg/dL — ABNORMAL HIGH (ref <117)

## 2010-05-12 LAB — URINE MICROSCOPIC-ADD ON

## 2010-05-12 LAB — LIPASE, BLOOD: Lipase: 30 U/L (ref 23–300)

## 2010-05-12 LAB — TSH: TSH: 5.221 u[IU]/mL — ABNORMAL HIGH (ref 0.350–4.500)

## 2010-05-12 LAB — PROTIME-INR
INR: 1.21 (ref 0.00–1.49)
Prothrombin Time: 15.2 seconds (ref 11.6–15.2)

## 2010-05-22 ENCOUNTER — Ambulatory Visit (HOSPITAL_COMMUNITY)
Admission: RE | Admit: 2010-05-22 | Discharge: 2010-05-22 | Disposition: A | Discharge status: Home / Self Care | Payer: Medicare Other | Source: Ambulatory Visit | Attending: Urology | Admitting: Urology

## 2010-05-22 ENCOUNTER — Encounter (HOSPITAL_COMMUNITY): Payer: Medicare Other

## 2010-05-22 ENCOUNTER — Other Ambulatory Visit: Payer: Self-pay | Admitting: Urology

## 2010-05-22 ENCOUNTER — Other Ambulatory Visit (HOSPITAL_COMMUNITY): Payer: Self-pay | Admitting: Urology

## 2010-05-22 ENCOUNTER — Other Ambulatory Visit (HOSPITAL_COMMUNITY): Payer: PRIVATE HEALTH INSURANCE

## 2010-05-22 DIAGNOSIS — Z01812 Encounter for preprocedural laboratory examination: Secondary | ICD-10-CM | POA: Insufficient documentation

## 2010-05-22 DIAGNOSIS — Z01811 Encounter for preprocedural respiratory examination: Secondary | ICD-10-CM

## 2010-05-22 DIAGNOSIS — Z01818 Encounter for other preprocedural examination: Secondary | ICD-10-CM | POA: Insufficient documentation

## 2010-05-22 DIAGNOSIS — Z01810 Encounter for preprocedural cardiovascular examination: Secondary | ICD-10-CM | POA: Insufficient documentation

## 2010-05-22 LAB — BASIC METABOLIC PANEL
BUN: 13 mg/dL (ref 6–23)
CO2: 30 mEq/L (ref 19–32)
Calcium: 9.1 mg/dL (ref 8.4–10.5)
Chloride: 104 mEq/L (ref 96–112)
Creatinine, Ser: 0.85 mg/dL (ref 0.4–1.5)
GFR calc Af Amer: >60 mL/min (ref >60)
GFR calc non Af Amer: >60 mL/min (ref >60)
Glucose, Bld: 120 mg/dL — ABNORMAL HIGH (ref 70–99)
Potassium: 4.4 mEq/L (ref 3.5–5.1)
Sodium: 141 mEq/L (ref 135–145)

## 2010-05-22 LAB — SURGICAL PCR SCREEN
MRSA, PCR: NEGATIVE
Staphylococcus aureus: NEGATIVE

## 2010-05-22 LAB — CBC
HCT: 45.6 % (ref 39.0–52.0)
Hemoglobin: 15.3 g/dL (ref 13.0–17.0)
MCH: 27.9 pg (ref 26.0–34.0)
MCHC: 33.6 g/dL (ref 30.0–36.0)
MCV: 83.2 fL (ref 78.0–100.0)
Platelets: 193 10*3/uL (ref 150–400)
RBC: 5.48 MIL/uL (ref 4.22–5.81)
RDW: 12.9 % (ref 11.5–15.5)
WBC: 9.3 10*3/uL (ref 4.0–10.5)

## 2010-06-01 ENCOUNTER — Inpatient Hospital Stay (HOSPITAL_COMMUNITY)
Admission: RE | Admit: 2010-06-01 | Discharge: 2010-06-02 | DRG: 708 | Disposition: A | Discharge status: Home / Self Care | Payer: Medicare Other | Source: Ambulatory Visit | Attending: Urology | Admitting: Urology

## 2010-06-01 ENCOUNTER — Other Ambulatory Visit: Payer: Self-pay | Admitting: Urology

## 2010-06-01 DIAGNOSIS — C61 Malignant neoplasm of prostate: Principal | ICD-10-CM | POA: Diagnosis present

## 2010-06-01 DIAGNOSIS — Z01812 Encounter for preprocedural laboratory examination: Secondary | ICD-10-CM

## 2010-06-01 DIAGNOSIS — N35919 Unspecified urethral stricture, male, unspecified site: Secondary | ICD-10-CM | POA: Diagnosis present

## 2010-06-01 DIAGNOSIS — I10 Essential (primary) hypertension: Secondary | ICD-10-CM | POA: Diagnosis present

## 2010-06-01 DIAGNOSIS — Z01811 Encounter for preprocedural respiratory examination: Secondary | ICD-10-CM

## 2010-06-01 DIAGNOSIS — K429 Umbilical hernia without obstruction or gangrene: Secondary | ICD-10-CM | POA: Diagnosis present

## 2010-06-01 DIAGNOSIS — Z79899 Other long term (current) drug therapy: Secondary | ICD-10-CM

## 2010-06-01 LAB — BASIC METABOLIC PANEL: Glucose, Bld: 148 mg/dL — ABNORMAL HIGH (ref 70–99)

## 2010-06-01 LAB — CBC
HCT: 47.2 % (ref 39.0–52.0)
Hemoglobin: 16.1 g/dL (ref 13.0–17.0)
MCH: 28.4 pg (ref 26.0–34.0)
MCHC: 34.1 g/dL (ref 30.0–36.0)
MCV: 83.2 fL (ref 78.0–100.0)
Platelets: ADEQUATE 10*3/uL (ref 150–400)
RBC: 5.67 MIL/uL (ref 4.22–5.81)
RDW: 12.9 % (ref 11.5–15.5)
WBC: 19.1 10*3/uL — ABNORMAL HIGH (ref 4.0–10.5)

## 2010-06-01 LAB — TYPE AND SCREEN
ABO/RH(D): A POS
Antibody Screen: NEGATIVE

## 2010-06-01 LAB — ABO/RH: ABO/RH(D): A POS

## 2010-06-02 LAB — DIFFERENTIAL
Basophils Absolute: 0 10*3/uL (ref 0.0–0.1)
Basophils Relative: 0 % (ref 0–1)
Eosinophils Absolute: 0.1 10*3/uL (ref 0.0–0.7)
Eosinophils Relative: 1 % (ref 0–5)
Lymphocytes Relative: 26 % (ref 12–46)
Lymphs Abs: 1.9 10*3/uL (ref 0.7–4.0)
Monocytes Absolute: 0.6 10*3/uL (ref 0.1–1.0)
Monocytes Relative: 9 % (ref 3–12)
Neutro Abs: 4.8 10*3/uL (ref 1.7–7.7)
Neutrophils Relative %: 64 % (ref 43–77)

## 2010-06-02 LAB — BASIC METABOLIC PANEL
BUN: 8 mg/dL (ref 6–23)
CO2: 26 mEq/L (ref 19–32)
Calcium: 7.9 mg/dL — ABNORMAL LOW (ref 8.4–10.5)
Chloride: 105 mEq/L (ref 96–112)
Creatinine, Ser: 0.75 mg/dL (ref 0.4–1.5)
GFR calc Af Amer: >60 mL/min (ref >60)
GFR calc non Af Amer: >60 mL/min (ref >60)
Glucose, Bld: 150 mg/dL — ABNORMAL HIGH (ref 70–99)
Potassium: 4.4 mEq/L (ref 3.5–5.1)
Sodium: 136 mEq/L (ref 135–145)

## 2010-06-02 LAB — CBC
HCT: 38.8 % — ABNORMAL LOW (ref 39.0–52.0)
Hemoglobin: 12.9 g/dL — ABNORMAL LOW (ref 13.0–17.0)
MCH: 27.8 pg (ref 26.0–34.0)
MCHC: 33.2 g/dL (ref 30.0–36.0)
MCV: 83.6 fL (ref 78.0–100.0)
Platelets: 37 10*3/uL — ABNORMAL LOW (ref 150–400)
RBC: 4.64 MIL/uL (ref 4.22–5.81)
RDW: 13.1 % (ref 11.5–15.5)
WBC: 7.4 10*3/uL (ref 4.0–10.5)

## 2010-06-17 NOTE — Op Note (Signed)
[name], [name]                 ACCOUNT NO.:  [id number]  MEDICAL RECORD NO.:  [id number]           PATIENT TYPE:  O  LOCATION:  1403                         FACILITY:  [facility]  PHYSICIAN:  Valetta Fuller, M.D.  DATE OF BIRTH:  [birth year]  DATE OF PROCEDURE: DATE OF DISCHARGE:                              OPERATIVE REPORT   PREOPERATIVE DIAGNOSES: 1. Adenocarcinoma of the prostate. 2. Umbilical hernia. 3. Urethral stricture disease.  POSTOPERATIVE DIAGNOSES: 1. Adenocarcinoma of the prostate. 2. Umbilical hernia. 3. Urethral stricture disease.  PROCEDURES PERFORMED: 1. Robotic-assisted laparoscopic radical retropubic prostatectomy. 2. Repair of umbilical hernia.  SURGEON:  Valetta Fuller, M.D.  FIRST ASSISTANT:  Delia Chimes, NP  ANESTHESIA:  General endotracheal.  INDICATIONS:  [name] is 70 years of age.  The patient had a mildly elevated PSA of just over 5.  Rectal exam was unremarkable.  Ultrasound was performed which showed 4/6 positive biopsies on the right and 1/6 on the left.  The patient had Gleason 3+3=6 cancer in all biopsies.  The patient also had a long-standing history of bulbar urethral stricture disease and underwent a balloon dilation within the past several weeks. He has had improvement in his urinary stream, but some slowing since the procedure.  The patient also has a symptomatic umbilical hernia.  The patient underwent extensive consultation with regard to treatment options for his prostate cancer.  He elected to proceed with the surgical approach, appearing to understand the advantages as well as potential disadvantages and risks associated with this procedure.  He also requested concurrent umbilical hernia repair which we felt we could do safely at the completion of his procedure through an incision made for his camera port and subsequent prostate removal.  TECHNIQUE AND FINDINGS:  The patient was brought to the operating room. He did  have placement of PAS compression boots and received perioperative Unasyn.  He had already performed a mechanical bowel prep. The patient had successful induction of general endotracheal anesthesia, was placed in the mid lithotomy position.  All extremities were carefully padded and he was secured to the operative table.  The patient was placed in steep Trendelenburg position and then prepped and draped in the usual manner.  A 16-French Foley catheter was inserted without difficulty on the operative field.  Camera port incision was chosen just to the left of the umbilicus in a periumbilical manner to allow for subsequent umbilical hernia repair.  A standard open Hasson technique was utilized and placement of the 12 mm trocar and initial abdominal insufflation occurred without incident.  The pelvis was endoscoped. There did not appear to be any bowel within the umbilical hernia, but some fat and potentially some small amount of omentum.  All other trocars were placed with direct visual guidance which included a 12 mm and 5 mm assist port and three 8 mm trocars for the robotic instrumentation.  After trocar placement, the surgical cart was docked.  The patient's bladder was filled and the space of Retzius developed with electrocautery scissors as well as blunt dissecting technique. Superficial fat which was fairly pronounced overlying the bladder  neck and endopelvic fascia was taken with hot electrocautery scissors and removed from the abdominal cavity.  Endopelvic fascia was then incised from base to apex, isolating the apex of the prostate and sweeping off levator musculature.  Dorsal venous complex was then isolated and stapled with an ETS stapling device with good hemostasis.  The Foley balloon catheter was used to identify the anterior bladder neck which was then transected with electrocautery scissors.  Indigo carmine was given after entry into the bladder and we were well away from  the ureteral orifices.  There did not appear to be a middle lobe component. The posterior bladder neck was then transected and dissection carried down to the adnexal structures.  Vas deferens and seminal vesicles were individually dissected free, retracted anteriorly, and the posterior plane between the rectum and prostate was then established with blunt dissecting technique.  Attention was then turned towards bilateral nerve spare.  Superficial fascia on the anterior aspect of the prostate was incised and then swept posterolaterally until grooves were established between the prostatic capsule and the neurovascular bundle on both sides.  With the prostate retracted anteriorly, the pedicles of the prostate were taken down with an EnSeal device.  The urethra was isolated, transected with electrocautery scissors, and the Foley catheter was then retracted allowing for complete transection of the urethra.  Rectourethralis muscle was incised sharply and the prostate specimen was removed from the hemipelvis.  Bladder irrigation along with rectal insufflation was then performed and there was no evidence of rectal injury.  Hemostasis remained quite good without any active bleeding.  We elected not to proceed with pelvic lymph node dissection due to a low risk estimated at 1% to 2% for pelvic metastatic adenopathy.  The bladder neck required no reconstruction and again the ureteral orifices were identified.  The posterior bladder neck and posterior urethra were then reapproximated with an interrupted Vicryl suture.  Anastomosis was then completed with a double-armed 3-0 Monocryl suture in a 360-degree running manner with new placement of Foley catheter without incident and insufflation of the bladder and irrigation which revealed no leakage.  A pelvic drain was placed through one of the robotic trocars and secured to the skin and positioned over the vesical anastomosis.  The prostate specimen was  placed in an Endopouch retrieval bag.  The 12 mm trocar site was closed with Vicryl suture with the aid of a suture passer under direct visual guidance.  All trocars were taken down with direct visual guidance and no bleeding was noted.  The camera port incision was extended to allow for removal of specimen. A 3-4 cm umbilical hernia sac was identified.  The sac was completely dissected free.  Palpably, this appeared to contain some fat, but had no bowel.  The hernia sac was opened and the walls which contained some adherent fat were excised.  The ostium of the ventral/umbilical hernia was then opened and then incorporated into the fascial closure which was done with a series of figure-of-8 #1 PDS sutures.  The incision along with all other trocar sites were infiltrated with Marcaine and closed with clips.  The bladder continued to irrigate clear.  Estimated blood loss was 150 cc, and the patient was brought to recovery room in stable condition having had no obvious complications.     Valetta Fuller, M.D.     DSG/MEDQ  D:  06/01/2010  T:  06/02/2010  Job:  [id number]  Electronically Signed by Barron Alvine M.D. on 06/17/2010 11:22:25 AM

## 2010-06-17 NOTE — Discharge Summary (Signed)
NAMEMASTER, TOUCHET                 ACCOUNT NO.:  [id number]  MEDICAL RECORD NO.:  [id number]           PATIENT TYPE:  O  LOCATION:  1403                         FACILITY:  [facility]  PHYSICIAN:  Valetta Fuller, M.D.  DATE OF BIRTH:  02-23-1940  DATE OF ADMISSION:  06/01/2010 DATE OF DISCHARGE:  06/02/2010                              DISCHARGE SUMMARY   ADMISSION DIAGNOSES: 1. Adenocarcinoma of prostate. 2. Umbilical hernia. 3. Urethral stricture disease.  DISCHARGE DIAGNOSES: 1. Adenocarcinoma of prostate. 2. Umbilical hernia. 3. Urethral stricture disease.  HISTORY AND PHYSICAL:  For full details, please see admission history and physical.  Briefly, [name] is a 70 year old gentleman who was found to have clinically localized adenocarcinoma of the prostate. After careful consideration regarding management options for treatment, he did elect to proceed with surgical therapy and a robotic-assisted laparoscopic radical prostatectomy.  During physical exam, he was noted to have umbilical hernia.  After discussing with Dr. Isabel Caprice, Dr. Isabel Caprice suggested repair of umbilical hernia during procedure for removal of prostate.  HOSPITAL COURSE:  On June 01, 2010, he was taken to the operating room where he underwent the above named procedures which he tolerated well and without complications.  Postoperatively, he was able to be transferred to a regular hospital room following recovery from Anesthesia.  He was found to hemodynamically stable as noted by a postoperative hemoglobin of 16.1.  There was some suggestion that this was an inaccurate sample due to fibrin clot on smear.  On postoperative day #1, he was found to be hemodynamically stable as noted by hemoglobin of 12.9.  He was found to have excellent urine output with minimal output from his pelvic drain.  Therefore on postoperative day #1, his pelvic drain was removed.  He was able to begin clear liquid diet and ambulation  postoperatively.  On the afternoon of postoperative day #1, he was ambulating without difficulty, tolerating a clear liquid diet, and his pain was well managed.  He was therefore felt stable for discharge home as he had met all discharge criteria.  DISPOSITION:  Home.  DISCHARGE MEDICATIONS:  He was instructed to resume all home medications including his benazepril.  In addition, he was provided a prescription for Vicodin to take as needed for pain and told to use Colace as a stool softener.  He was also given a prescription for Cipro to begin 2 days prior to return visit for removal of Foley catheter.  DISCHARGE INSTRUCTIONS:  He was instructed to be ambulatory but specifically told to refrain from any heavy lifting, strenuous activity or driving.  He was instructed on routine Foley catheter care and told to gradually advance his diet over the course of the next few days.  FOLLOWUP:  He will follow up in 10 to 14 days for removal of Foley catheter and skin staple as well as further postoperative evaluation.     Delia Chimes, NP   ______________________________ Valetta Fuller, M.D.    MA/MEDQ  D:  06/02/2010  T:  06/03/2010  Job:  [id number]  Electronically Signed by Delia Chimes NP on 06/09/2010 11:33:01 AM  Electronically Signed by Barron Alvine M.D. on 06/17/2010 11:22:17 AM

## 2011-02-28 IMAGING — CT CT ABDOMEN W/O CM
2 of 4 series · 17 of 46 positions shown, 19 images · non-contrast
Comparison: 06/18/2009

CLINICAL DATA: Low grade fever, right upper quadrant pain.

CT ABDOMEN WITHOUT CONTRAST
TECHNIQUE: Multidetector CT imaging of the abdomen was performed
following the standard protocol without IV contrast.

[Series 2: rtn ap without · axial · non-contrast · 0.90mm/px · z∈[-336,+18]mm · 14 of 79 slices shown, 16 images]
[im 4/79  soft-tissue]
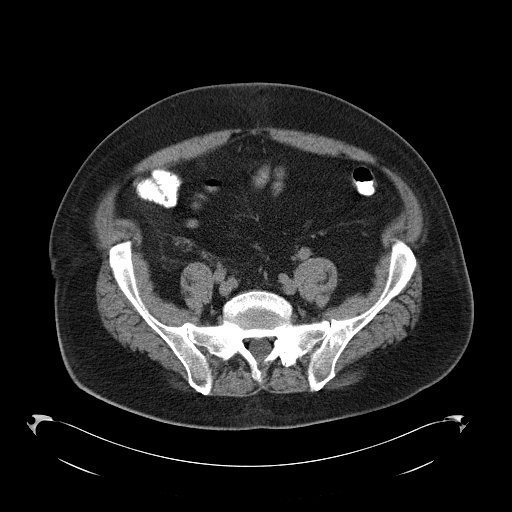
[im 4/79  bone]
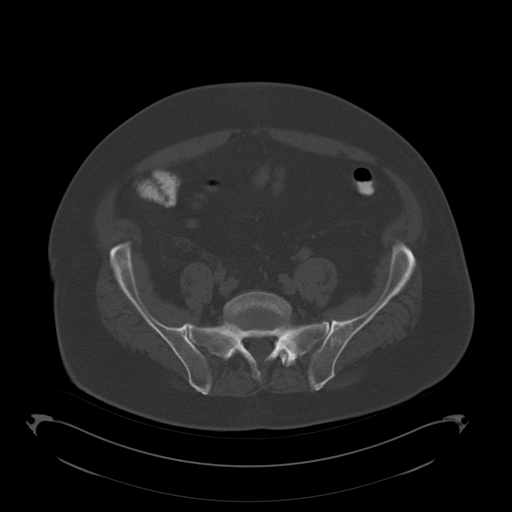
[im 11/79  soft-tissue]
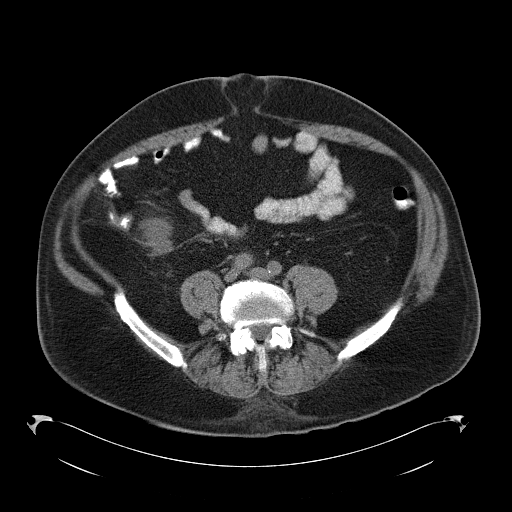
[im 14/79  soft-tissue]
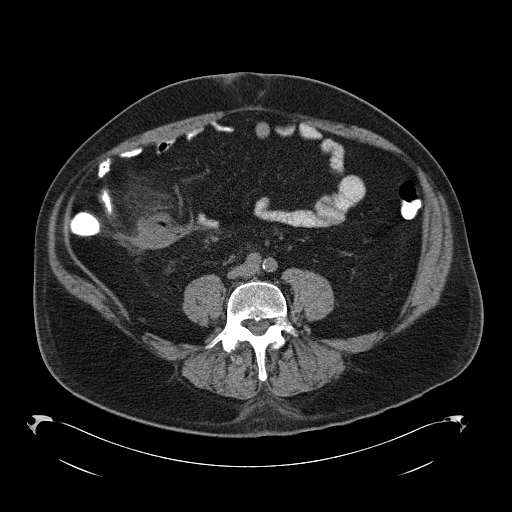
[im 21/79  soft-tissue]
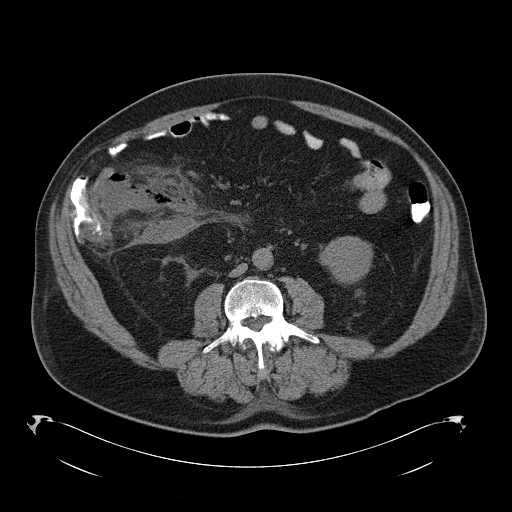
[im 28/79  soft-tissue]
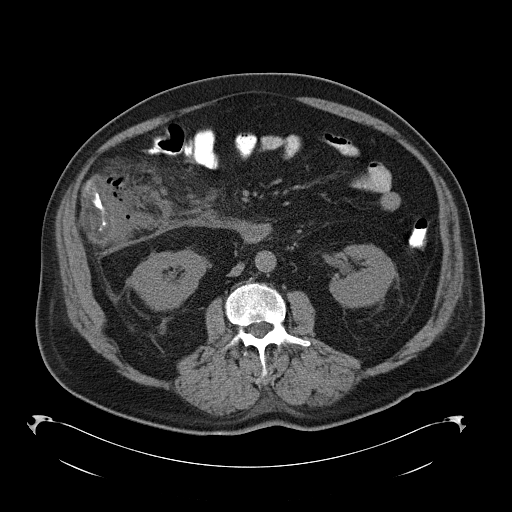
[im 31/79  soft-tissue]
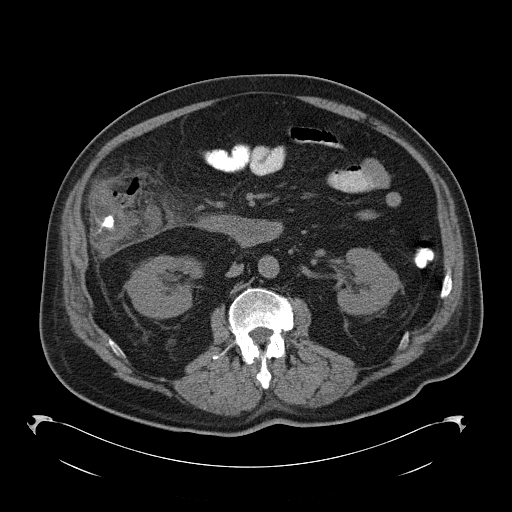
[im 38/79  soft-tissue]
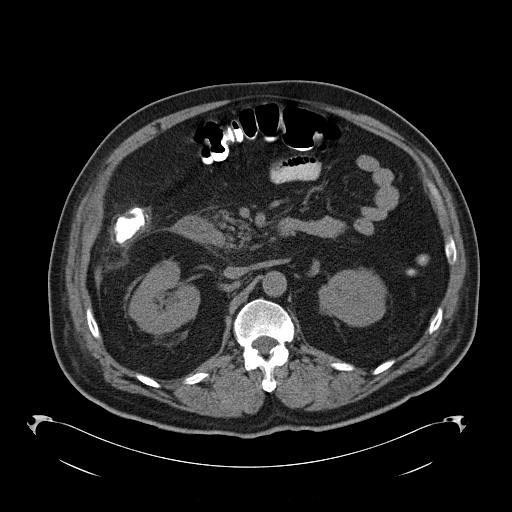
[im 41/79  soft-tissue]
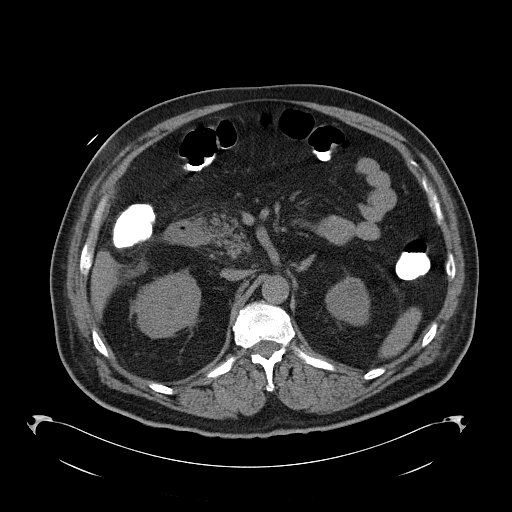
[im 48/79  soft-tissue]
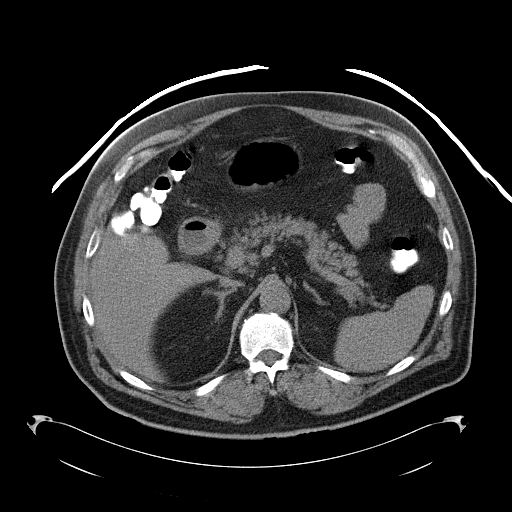
[im 48/79  bone]
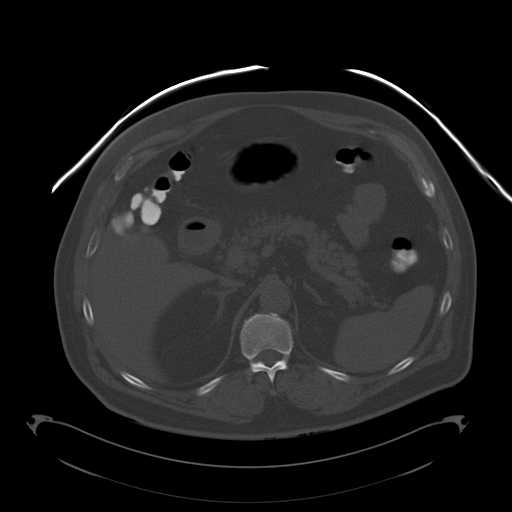
[im 51/79  soft-tissue]
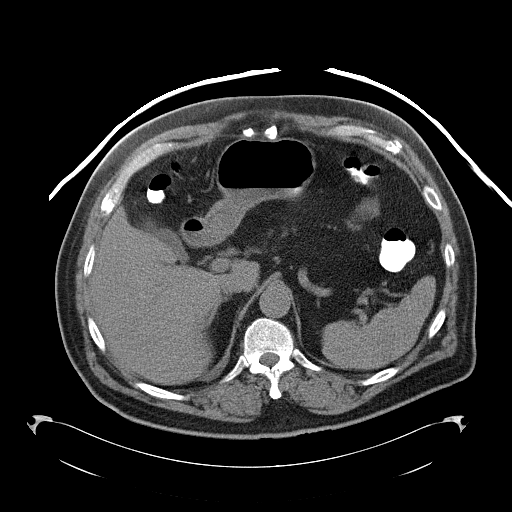
[im 58/79  soft-tissue]
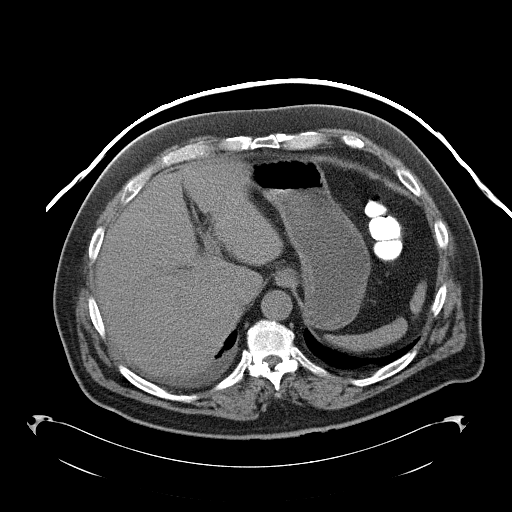
[im 65/79  soft-tissue]
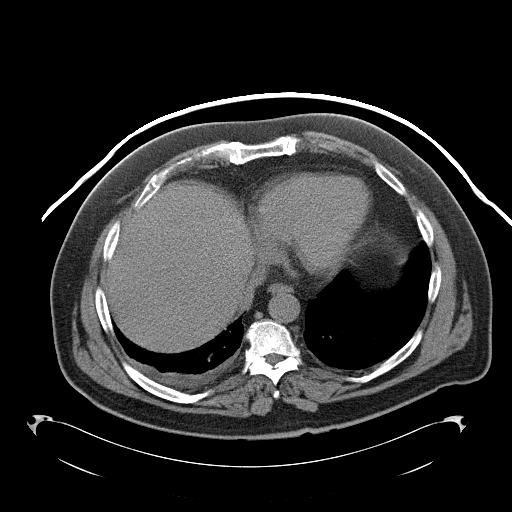
[im 68/79  soft-tissue]
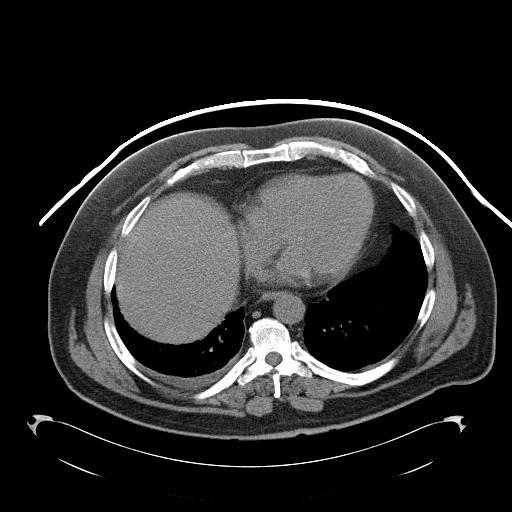
[im 75/79  soft-tissue]
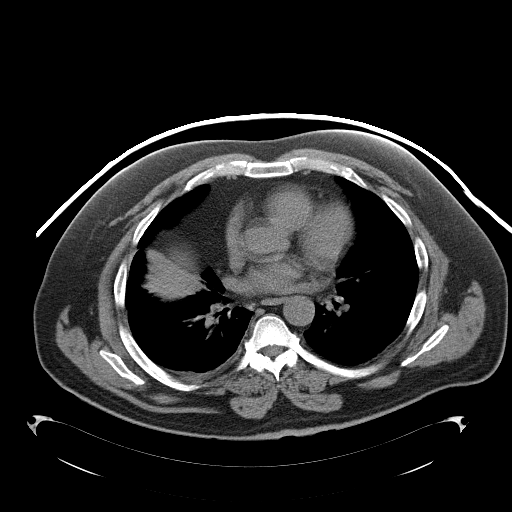

[Series 602: coronal images · coronal · 0.90mm/px · 3 of 109 slices shown]
[im 37/109  soft-tissue]
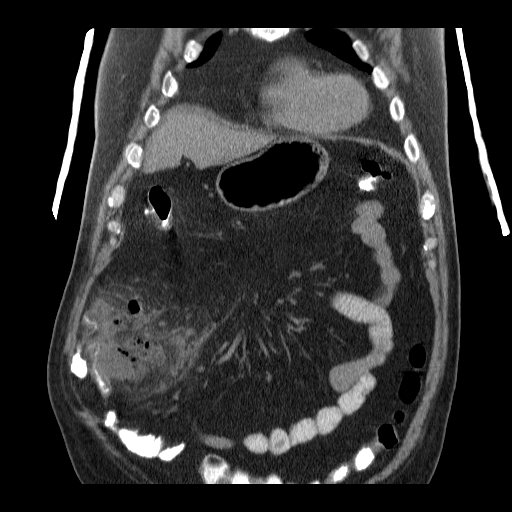
[im 49/109  soft-tissue]
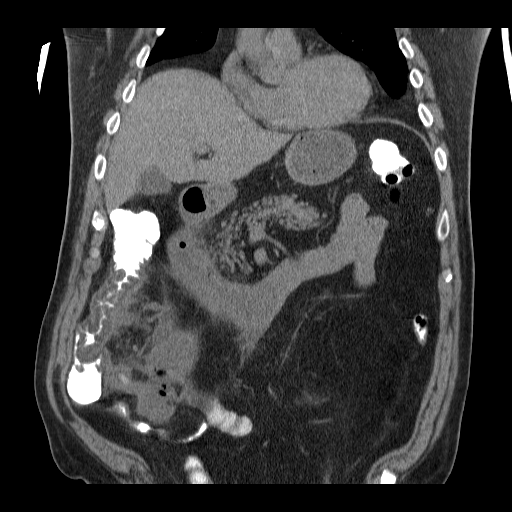
[im 61/109  soft-tissue]
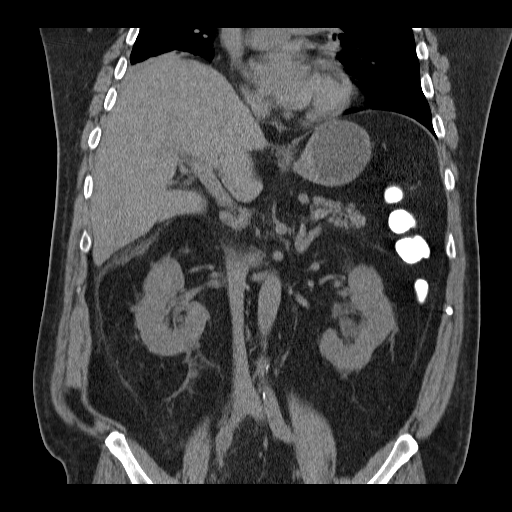

[17 of 46 positions shown; findings below may reference images not displayed]

FINDINGS: There is continued extraluminal gas and fluid
collections within the right lower quadrant.  Increasing fluid
collections compatible with developing abscesses in the right lower
quadrant.  There is contrast present within the colon and terminal
ileum.  There is question of a small amount of extraluminal
contrast lateral to the cecum/ascending colon on images 54-58.  The
largest developing collection in the right lower quadrant measures
approximately 8.7 x 3.3 cm.  Other smaller adjacent fluid
collections are noted.  Enlarged right lower quadrant mesenteric
lymph nodes.

Previously seen thickening of the second and third portions of the
duodenum are less pronounced on today's study.  Liver, pancreas,
adrenals, spleen are unremarkable.  There is mild bilateral
perinephric stranding.  No hydronephrosis.  Small nonobstructing
bilateral renal calculi present.

Gallbladder is contracted.  Small bowel is grossly unremarkable.

Small right pleural effusion.  There is right base atelectasis.
IMPRESSION: Continued inflammatory process within the right lower quadrant.
Increasing extraluminal gas and fluid collections compatible with
developing abscesses.  There is question of extravasated contrast
lateral to the cecum and ascending colon.

Decreasing wall thickness of the duodenum.

Small right pleural effusion.

## 2013-04-22 ENCOUNTER — Observation Stay (HOSPITAL_BASED_OUTPATIENT_CLINIC_OR_DEPARTMENT_OTHER)
Admission: EM | Admit: 2013-04-22 | Discharge: 2013-04-23 | Disposition: A | Discharge status: Home / Self Care | Payer: Medicare Other | Attending: Neurosurgery | Admitting: Neurosurgery

## 2013-04-22 ENCOUNTER — Emergency Department (HOSPITAL_BASED_OUTPATIENT_CLINIC_OR_DEPARTMENT_OTHER): Payer: Medicare Other

## 2013-04-22 ENCOUNTER — Encounter (HOSPITAL_BASED_OUTPATIENT_CLINIC_OR_DEPARTMENT_OTHER): Payer: Self-pay | Admitting: Emergency Medicine

## 2013-04-22 DIAGNOSIS — S064XAA Epidural hemorrhage with loss of consciousness status unknown, initial encounter: Secondary | ICD-10-CM

## 2013-04-22 DIAGNOSIS — S0990XA Unspecified injury of head, initial encounter: Secondary | ICD-10-CM | POA: Diagnosis present

## 2013-04-22 DIAGNOSIS — S0101XA Laceration without foreign body of scalp, initial encounter: Secondary | ICD-10-CM

## 2013-04-22 DIAGNOSIS — I10 Essential (primary) hypertension: Secondary | ICD-10-CM | POA: Insufficient documentation

## 2013-04-22 DIAGNOSIS — S0100XA Unspecified open wound of scalp, initial encounter: Secondary | ICD-10-CM | POA: Insufficient documentation

## 2013-04-22 DIAGNOSIS — S069X9A Unspecified intracranial injury with loss of consciousness of unspecified duration, initial encounter: Principal | ICD-10-CM | POA: Insufficient documentation

## 2013-04-22 DIAGNOSIS — W010XXA Fall on same level from slipping, tripping and stumbling without subsequent striking against object, initial encounter: Secondary | ICD-10-CM | POA: Insufficient documentation

## 2013-04-22 DIAGNOSIS — Z7982 Long term (current) use of aspirin: Secondary | ICD-10-CM | POA: Insufficient documentation

## 2013-04-22 DIAGNOSIS — S065XAA Traumatic subdural hemorrhage with loss of consciousness status unknown, initial encounter: Secondary | ICD-10-CM

## 2013-04-22 DIAGNOSIS — S064X9A Epidural hemorrhage with loss of consciousness of unspecified duration, initial encounter: Secondary | ICD-10-CM

## 2013-04-22 DIAGNOSIS — E119 Type 2 diabetes mellitus without complications: Secondary | ICD-10-CM | POA: Insufficient documentation

## 2013-04-22 DIAGNOSIS — S065X9A Traumatic subdural hemorrhage with loss of consciousness of unspecified duration, initial encounter: Secondary | ICD-10-CM

## 2013-04-22 DIAGNOSIS — E7800 Pure hypercholesterolemia, unspecified: Secondary | ICD-10-CM | POA: Insufficient documentation

## 2013-04-22 HISTORY — DX: Pure hypercholesterolemia, unspecified: E78.00

## 2013-04-22 HISTORY — DX: Type 2 diabetes mellitus without complications: E11.9

## 2013-04-22 HISTORY — DX: Malignant (primary) neoplasm, unspecified: C80.1

## 2013-04-22 HISTORY — DX: Essential (primary) hypertension: I10

## 2013-04-22 HISTORY — DX: Unspecified injury of head, initial encounter: S09.90XA

## 2013-04-22 LAB — COMPREHENSIVE METABOLIC PANEL
ALT: 25 U/L (ref 0–53)
AST: 24 U/L (ref 0–37)
Albumin: 4.1 g/dL (ref 3.5–5.2)
Alkaline Phosphatase: 62 U/L (ref 39–117)
BUN: 16 mg/dL (ref 6–23)
CO2: 28 mEq/L (ref 19–32)
Calcium: 9.8 mg/dL (ref 8.4–10.5)
Chloride: 97 mEq/L (ref 96–112)
Creatinine, Ser: 0.8 mg/dL (ref 0.50–1.35)
GFR calc Af Amer: >90 mL/min (ref >90)
GFR calc non Af Amer: 88 mL/min — ABNORMAL LOW (ref >90)
Glucose, Bld: 218 mg/dL — ABNORMAL HIGH (ref 70–99)
Potassium: 5.1 mEq/L (ref 3.7–5.3)
Sodium: 139 mEq/L (ref 137–147)
Total Bilirubin: 1.1 mg/dL (ref 0.3–1.2)
Total Protein: 7.6 g/dL (ref 6.0–8.3)

## 2013-04-22 LAB — MRSA PCR SCREENING: MRSA by PCR: NEGATIVE

## 2013-04-22 LAB — CBC WITH DIFFERENTIAL/PLATELET
Basophils Absolute: 0 10*3/uL (ref 0.0–0.1)
Basophils Relative: 0 % (ref 0–1)
Eosinophils Absolute: 0.2 10*3/uL (ref 0.0–0.7)
Eosinophils Relative: 1 % (ref 0–5)
HCT: 46.7 % (ref 39.0–52.0)
Hemoglobin: 16.1 g/dL (ref 13.0–17.0)
Lymphocytes Relative: 15 % (ref 12–46)
Lymphs Abs: 1.9 10*3/uL (ref 0.7–4.0)
MCH: 27.7 pg (ref 26.0–34.0)
MCHC: 34.5 g/dL (ref 30.0–36.0)
MCV: 80.2 fL (ref 78.0–100.0)
Monocytes Absolute: 0.8 10*3/uL (ref 0.1–1.0)
Monocytes Relative: 6 % (ref 3–12)
Neutro Abs: 9.9 10*3/uL — ABNORMAL HIGH (ref 1.7–7.7)
Neutrophils Relative %: 77 % (ref 43–77)
Platelets: 221 10*3/uL (ref 150–400)
RBC: 5.82 MIL/uL — ABNORMAL HIGH (ref 4.22–5.81)
RDW: 13.1 % (ref 11.5–15.5)
WBC: 12.8 10*3/uL — ABNORMAL HIGH (ref 4.0–10.5)

## 2013-04-22 LAB — PROTIME-INR
INR: 1.03 (ref 0.00–1.49)
Prothrombin Time: 13.3 seconds (ref 11.6–15.2)

## 2013-04-22 MED ORDER — HYDROCHLOROTHIAZIDE 25 MG PO TABS: 25.0000 mg | ORAL_TABLET | Freq: Every day | ORAL | Status: DC

## 2013-04-22 MED ORDER — METFORMIN HCL 500 MG PO TABS
500.0000 mg | ORAL_TABLET | Freq: Every day | ORAL | Status: DC
  Filled 2013-04-22: qty 1

## 2013-04-22 MED ORDER — METFORMIN HCL ER 500 MG PO TB24
500.0000 mg | ORAL_TABLET | Freq: Every day | ORAL | Status: DC
  Administered 2013-04-23: 500 mg via ORAL
  Filled 2013-04-22 (×2): qty 1

## 2013-04-22 MED ORDER — ONDANSETRON HCL 4 MG PO TABS: 4.0000 mg | ORAL_TABLET | Freq: Four times a day (QID) | ORAL | Status: DC | PRN

## 2013-04-22 MED ORDER — ACETAMINOPHEN 325 MG PO TABS
325.0000 mg | ORAL_TABLET | ORAL | Status: DC | PRN
  Administered 2013-04-22 – 2013-04-23 (×2): 650 mg via ORAL
  Filled 2013-04-22 (×2): qty 2

## 2013-04-22 MED ORDER — HYDROCODONE-ACETAMINOPHEN 5-325 MG PO TABS
1.0000 | ORAL_TABLET | ORAL | Status: DC | PRN
  Administered 2013-04-22: 2 via ORAL
  Filled 2013-04-22: qty 2

## 2013-04-22 MED ORDER — ONDANSETRON HCL 4 MG/2ML IJ SOLN: 4.0000 mg | Freq: Four times a day (QID) | INTRAMUSCULAR | Status: DC | PRN

## 2013-04-22 NOTE — ED Notes (Signed)
Original bed assignment changed, waiting for bed assignment.

## 2013-04-22 NOTE — ED Notes (Signed)
1133 placed called to care link rep Sam stated that he would re page Neurosurgeon.

## 2013-04-22 NOTE — ED Notes (Signed)
Patient here with large posterior scalp laceration. Patient slipped on ice falling backwards striking head. Positive loc. No nausea, no blurred vision, alert and oriented. headache

## 2013-04-22 NOTE — ED Notes (Signed)
Patient returned from CT

## 2013-04-22 NOTE — ED Notes (Signed)
Bed request placed  at [id number] awaiting response. [employer]

## 2013-04-22 NOTE — ED Notes (Signed)
Pt to be admitted to 3M14.  Waiting for bed to be cleaned.

## 2013-04-22 NOTE — ED Notes (Addendum)
Called bed placement, bed is ready.  Attempted to call report, nurse at lunch and will return call when she returns.

## 2013-04-22 NOTE — ED Notes (Signed)
Called bed placement spoke with [facility], she stated that pt will be going to unit 4M awaiting the transfer of current pt there out and then room has to be cleaned and that this process can roughly take up to an hour or more. [employer]

## 2013-04-22 NOTE — ED Provider Notes (Signed)
CSN: [id number]     Arrival date & time 04/22/13  5170 History   First MD Initiated Contact with Patient 04/22/13 [phone]     Chief Complaint  Patient presents with  . Head Injury   HPI Pt was walking on his drive way and slipped on ice, hitting the back of his head. He believes he loss consciousness, woke to pool of blood beneath him. He was able to get up and walk back into house without discomfort. He denies nausea or vomit. He is hungry. Patient reports no dizziness or chest pain prior to fall. Fall was un witnessed. He remained continent of urine and BM during event. No history of seizures. He takes 81 mg ASA daily. No blood disorders. He is a diabetic and takes metformin daily. Wife reports he was oriented and alert when he returned to house. He complains of a headache only. He does not have mud on bilateral knees from fall, but reports they are not bothering him. Denies hip pain.   Past Medical History  Diagnosis Date  . Diabetes mellitus without complication   . Hypertension   . High cholesterol    History reviewed. No pertinent past surgical history. No family history on file. History  Substance Use Topics  . Smoking status: Never Smoker   . Smokeless tobacco: Not on file  . Alcohol Use: Not on file    Review of Systems  Constitutional: Negative for diaphoresis and appetite change.  HENT: Negative for drooling, ear discharge, ear pain, facial swelling, nosebleeds, rhinorrhea, tinnitus and trouble swallowing.   Eyes: Negative for pain, discharge and visual disturbance.  Respiratory: Negative for chest tightness and shortness of breath.   Cardiovascular: Negative for chest pain and palpitations.  Gastrointestinal: Negative for nausea, vomiting and abdominal pain.  Musculoskeletal: Negative for arthralgias, back pain, gait problem, joint swelling, myalgias, neck pain and neck stiffness.  Neurological: Positive for headaches. Negative for dizziness, tremors, seizures, weakness,  light-headedness and numbness.    Allergies  Review of patient's allergies indicates no known allergies.  Home Medications   Current Outpatient Rx  Name  Route  Sig  Dispense  Refill  . aspirin 81 MG tablet   Oral   Take 81 mg by mouth daily.         . hydrochlorothiazide (HYDRODIURIL) 25 MG tablet   Oral   Take 25 mg by mouth daily.         . metFORMIN (GLUCOPHAGE) 500 MG tablet   Oral   Take by mouth 2 (two) times daily with a meal.          BP 162/84  Pulse 73  Temp(Src) 98.5 F (36.9 C) (Oral)  Resp 18  SpO2 98% Physical Exam BP 162/84  Pulse 73  Temp(Src) 98.5 F (36.9 C) (Oral)  Resp 18  SpO2 98% Gen: NAD. Lying in bed.  HEENT:[place]. Laceration to posterior headBilateral TM visualized and normal in appearance. Bilateral eyes without injections or icterus. MMM. Bilateral nares without drainage, blood or trauma. CV: RRR, no murmur aprreciated Chest: CTAB, no wheeze or crackles Abd: Soft. NTND. BS present. No HSM.  Ext: No erythema. No edema. No swelling, bruising, lacerations or other signs of trauma. Not TTP bilateral hip over greater trochanter or bilateral knee over medial/lateral and tibial tuberosity. Negative anterior and posterior drawer signs.  Msk: 5/5 strength bilateral UE/LE.  Skin: No lacerations, other than 6.5 cm horizontal laceration over crown of scalp.  Neuro:  PERLA. EOMi. Alert. Cranial Nerves  Grossly intact.   ED Course  Procedures (including critical care time) Labs Review Labs Reviewed  CBC WITH DIFFERENTIAL - Abnormal; Notable for the following:    WBC 12.8 (*)    RBC 5.82 (*)    Neutro Abs 9.9 (*)    All other components within normal limits  COMPREHENSIVE METABOLIC PANEL - Abnormal; Notable for the following:    Glucose, Bld 218 (*)    GFR calc non Af Amer 88 (*)    All other components within normal limits  PROTIME-INR   Imaging Review Dg Chest 2 View  04/22/2013   CLINICAL DATA:  Fall on ice with subdural hematoma.   Hypertension.  EXAM: CHEST  2 VIEW  COMPARISON:  05/22/2010  FINDINGS: Midline trachea. Borderline cardiomegaly. Mediastinal contours otherwise within normal limits. No pleural effusion or pneumothorax. Clear lungs.  IMPRESSION: No acute cardiopulmonary disease.   Electronically Signed   By: Abigail Miyamoto M.D.   On: 04/22/2013 11:44   Ct Head Wo Contrast  04/22/2013   CLINICAL DATA:  Blunt trauma and laceration post fall.  Headache.  EXAM: CT HEAD WITHOUT CONTRAST  TECHNIQUE: Contiguous axial images were obtained from the base of the skull through the vertex without intravenous contrast.  COMPARISON:  None.  FINDINGS: Left tentorial subdural hematoma. There is a small anterior left parafalcine subarachnoid hemorrhage and a right posterior parietal subarachnoid hemorrhage. None results in any significant mass effect. There is no midline shift. No hydrocephalus. No focal parenchymal edema. There is mild diffuse parenchymal atrophy. There are symmetric patchy areas of hypoattenuation in deep and periventricular white matter. Ventricles and sulci are symmetric. No mass. Acute infarct may be inapparent on non contrast CT. Bone windows reveal no displaced fracture. There is right parietal scalp hematoma.  IMPRESSION: 1. Left tentorial subdural hematoma. 2. Right posterior parietal and anterior left parafalcine subarachnoid hemorrhages. 3. No mass effect,   midline shift,  herniation, or hydrocephalus. Critical Value/emergent results were called by telephone at the time of interpretation on 04/22/2013 at 11:05 AM to Dr. Leonard Schwartz , who verbally acknowledged these results. 4. Atrophy and nonspecific white matter changes, probably chronic.   Electronically Signed   By: Arne Cleveland M.D.   On: 04/22/2013 11:06     EKG Interpretation None      MDM   Final diagnoses:  Epidural hematoma  Subdural hematoma  Scalp laceration   Patient neuro exam normal. 6.5 cm laceration to scalp repaired with 4-0 prolene,  running stitch and re-dressed with pressure dressing. Patient was transferred to [facility] under Neurology via EMS. CT findings consistent with left tentorial subdural hematoma, right posterior parietal and anterior left parafalcine subarachnoid hemorrhages. . CXR,  PT/INR, CBC and CMP obtained prior to transfer.     Ma Hillock, DO 04/22/13 1316

## 2013-04-22 NOTE — H&P (Signed)
Subjective: Patient is a 73 y.o. male who was transferred from med center [facility] emergency room at the request of Dr. Leonard Schwartz, the emergency room physician. He is admitted for treatment of a closed head injury. Patient explains that he was going out to his wood stove and slipped on the ice falling backward striking the back of his head. He believes he suffered loss of consciousness. When he came to he went back into the house and subsequently was transferred to med center [facility] ER for evaluation. Dr. Audie Pinto obtained today CT the brain without contrast it showed a small amount of blood at the anterior edge of the falx in the left interhemispheric fissure, as well as a small amount of blood layering on the left tentorium. Neurosurgical consultation and transfer was requested, the patient was transferred to the neurosurgical intensive care unit at [facility].  Symptomatically the patient is headaches, nausea, vomiting, seizures, weakness, or other neurologic complaints. He does describe a history of treated hypertension, hypercholesterolemia, prostate cancer, and diverticulitis. He is status post prostatectomy by Dr. Rana Snare, status post partial colectomy, and status post a left eye cataract extraction and lens implant. It denies allergies to medications her medications include hydrochlorothiazide unsure of dosage, metformin 500 mg every morning, a cholesterol pill unsure of dosage, and aspirin 81 mg daily.   Patient Active Problem List   Diagnosis Date Noted  . Closed head injury 04/22/2013  . HYPERLIPIDEMIA 07/29/2009  . HYPERTENSION 07/29/2009  . EFFUSION, PLEURAL 07/29/2009  . DIVERTICULITIS, COLON 07/29/2009  . 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 COLONIC POLYPS, HYPERPLASTIC, HX OF 07/29/2009   Past Medical History  Diagnosis Date  . Diabetes mellitus without complication   . Hypertension   . High cholesterol   . Cancer     prostate    Past Surgical History   Procedure Laterality Date  . Colon surgery      partial  . Eye surgery      lens implant and cateract- Left  . Prostatectomy      Prescriptions prior to admission  Medication Sig Dispense Refill  . aspirin 81 MG tablet Take 81 mg by mouth daily.      . hydrochlorothiazide (HYDRODIURIL) 25 MG tablet Take 25 mg by mouth daily.      . metFORMIN (GLUCOPHAGE) 500 MG tablet Take by mouth 2 (two) times daily with a meal.       No Known Allergies  History  Substance Use Topics  . Smoking status: Never Smoker   . Smokeless tobacco: Not on file  . Alcohol Use: Not on file    History reviewed. No pertinent family history.   Review of Systems A comprehensive review of systems was negative.  Objective: Vital signs in last 24 hours: Temp:  [98.5 F (36.9 C)] 98.5 F (36.9 C) (03/01 0942) Pulse Rate:  [73-83] 83 (03/01 1318) Resp:  [18] 18 (03/01 1318) BP: (157-162)/(80-95) 157/95 mmHg (03/01 1549) SpO2:  [98 %-100 %] 100 % (03/01 1318) Weight:  [108.3 kg (238 lb 12.1 oz)] 108.3 kg (238 lb 12.1 oz) (03/01 1549)  EXAM: Patient well-developed well-nourished white male in no acute distress. External examination shows a well sutured laceration in the occiput. Lungs are clear to auscultation , the patient has symmetrical respiratory excursion. Heart has a regular rate and rhythm normal S1 and S2 no murmur.   Abdomen is soft nontender nondistended bowel sounds are present. Extremity examination shows no clubbing cyanosis or edema. Mental status  examination: Patient is awake alert, fully oriented, speech is fluent, he has good comprehension, he follows commands. Cranial nerve examination: Pupils are equal, round, and reactive to light, and approximately 3 mm bilaterally. EOMI. Facial movement symmetrical. Facial sensation intact. Hearing present bilaterally. Palatal movement symmetrical. Shoulder shrug symmetrical. Tongue midline. Motor examination: 5/5 strength in the upper and lower extremities.  No drift of the upper extremities. Sensory examination: Intact to pinprick through the upper and lower extremities. Reflex examination: Symmetrical in the upper and lower extremities. Toes downgoing. Gait and stance: Not tested due to the nature the patient's condition.   Data Review:CBC    Component Value Date/Time   WBC 12.8* 04/22/2013 1125   RBC 5.82* 04/22/2013 1125   HGB 16.1 04/22/2013 1125   HCT 46.7 04/22/2013 1125   PLT 221 04/22/2013 1125   MCV 80.2 04/22/2013 1125   MCH 27.7 04/22/2013 1125   MCHC 34.5 04/22/2013 1125   RDW 13.1 04/22/2013 1125   LYMPHSABS 1.9 04/22/2013 1125   MONOABS 0.8 04/22/2013 1125   EOSABS 0.2 04/22/2013 1125   BASOSABS 0.0 04/22/2013 1125                          BMET    Component Value Date/Time   NA 139 04/22/2013 1125   K 5.1 04/22/2013 1125   CL 97 04/22/2013 1125   CO2 28 04/22/2013 1125   GLUCOSE 218* 04/22/2013 1125   BUN 16 04/22/2013 1125   CREATININE 0.80 04/22/2013 1125   CALCIUM 9.8 04/22/2013 1125   GFRNONAA 88* 04/22/2013 1125   GFRAA >90 04/22/2013 1125     Assessment/Plan: Patient with a closed head injury, with a brief loss of consciousness less than one hour, Glasgow Coma Scale 15/15. Occipital scalp laceration sutured at the med center [facility] ER. CT of the brain without contrast shows small amount of anterior interhemispheric blood on the left side, adjacent to the anterior inferior aspect of the falx, and a small amount of blood layering on the left tentorium.  Patient will be monitored for 24 hours, repeat CT the brain without contrast will be obtained tomorrow morning, and if the patient remains neurologically stable, and taking well by mouth, we will be able to discharge him to home.  For now we'll only ordered his metformin, since the details regarding the remainder of his medications are unclear. He'll be ordered a carb modified diet.  Hosie Spangle, MD 04/22/2013 4:02 PM

## 2013-04-23 ENCOUNTER — Observation Stay (HOSPITAL_COMMUNITY): Payer: Medicare Other

## 2013-04-23 LAB — GLUCOSE, CAPILLARY
Glucose-Capillary: 183 mg/dL — ABNORMAL HIGH (ref 70–99)
Glucose-Capillary: 184 mg/dL — ABNORMAL HIGH (ref 70–99)

## 2013-04-23 MED ORDER — ASPIRIN EC 81 MG PO TBEC: 81.0000 mg | DELAYED_RELEASE_TABLET | Freq: Every day | ORAL | Status: DC

## 2013-04-23 NOTE — Progress Notes (Signed)
AVS reviewed with patient & wife and signed.  Pt discharged to home via wheelchair.

## 2013-04-23 NOTE — Discharge Summary (Signed)
Physician Discharge Summary  Patient ID: Justin [name] MRN: [id number] DOB/AGE: 10-15-1940 73 y.o.  Admit date: 04/22/2013 Discharge date: 04/23/2013  Admission Diagnoses:  Closed head injury, with loss of consciousness less than one hour, with traumatic subarachnoid hemorrhage and scalp laceration  Discharge Diagnoses:  Closed head injury, with loss of consciousness less than one hour, with traumatic subarachnoid hemorrhage and scalp laceration  Active Problems:   Closed head injury  Discharged Condition: good  Hospital Course: Patient was admitted in transfer from med center [facility] after sustaining a head injury yesterday morning. He has remained neurologically stable. Repeat CT scan of the brain without contrast this morning showed no change in the small amount of traumatic subarachnoid hemorrhage. He is up and ambulating in the halls. He is awake, alert, fully oriented. Speech is fluent. He has good comprehension. Strength is intact in the upper and lower extremities. He is eating a carb modified diet, and denies nausea or vomiting. He does have some mild headache, but it responds well to Tylenol.  His scalp laceration, which was sutured at med center [facility], is clean dry, and healing well. He has been instructed not to consume alcohol for the next 2 weeks, not to drive for the next week, to avoid heavy and strenuous activities for 2 weeks, and to return for followup with me in 2 weeks, with a CT the brain without contrast on the day the appointment. I've instructed in to resume his home medications, other than his aspirin 81 mg, which is to resume in 10 days. Instructions were given to both the patient and his wife, and I did have an opportunity discuss his condition and CT scan results with both of them together.  Discharge Exam: Blood pressure 143/76, pulse 67, temperature 98.2 F (36.8 C), temperature source Oral, resp. rate 14, height 6' (1.829 m), weight 108.3 kg (238 lb 12.1 oz),  SpO2 98.00%.  Disposition: Home     Medication List         aspirin EC 81 MG tablet  Take 1 tablet (81 mg total) by mouth daily.  Start taking on:  05/03/2013     atorvastatin 80 MG tablet  Commonly known as:  LIPITOR  Take 40 mg by mouth daily.     glipiZIDE 5 MG tablet  Commonly known as:  GLUCOTROL  Take 2.5 mg by mouth daily before breakfast.     hydrochlorothiazide 25 MG tablet  Commonly known as:  HYDRODIURIL  Take 12.5 mg by mouth daily.     losartan 100 MG tablet  Commonly known as:  COZAAR  Take 100 mg by mouth daily.     metFORMIN 500 MG 24 hr tablet  Commonly known as:  GLUCOPHAGE-XR  Take 500 mg by mouth daily with breakfast.     polyvinyl alcohol 1.4 % ophthalmic solution  Commonly known as:  LIQUIFILM TEARS  Place 1 drop into both eyes 4 (four) times daily as needed for dry eyes.         Signed: Hosie Spangle, MD 04/23/2013, 12:36 PM

## 2013-04-23 NOTE — Discharge Instructions (Addendum)
Concussion, Adult °A concussion, or closed-head injury, is a brain injury caused by a direct blow to the head or by a quick and sudden movement (jolt) of the head or neck. Concussions are usually not life-threatening. Even so, the effects of a concussion can be serious. If you have had a concussion before, you are more likely to experience concussion-like symptoms after a direct blow to the head.  °CAUSES  °· Direct blow to the head, such as from running into another player during a soccer game, being hit in a fight, or hitting your head on a hard surface. °· A jolt of the head or neck that causes the brain to move back and forth inside the skull, such as in a car crash. °SIGNS AND SYMPTOMS  °The signs of a concussion can be hard to notice. Early on, they may be missed by you, family members, and health care providers. You may look fine but act or feel differently. °Symptoms are usually temporary, but they may last for days, weeks, or even longer. Some symptoms may appear right away while others may not show up for hours or days. Every head injury is different. Symptoms include:  °· Mild to moderate headaches that will not go away. °· A feeling of pressure inside your head.  °· Having more trouble than usual:   °· Learning or remembering things you have heard. °· Answering questions.  °· Paying attention or concentrating.   °· Organizing daily tasks.   °· Making decisions and solving problems.   °· Slowness in thinking, acting or reacting, speaking, or reading.   °· Getting lost or being easily confused.   °· Feeling tired all the time or lacking energy (fatigued).   °· Feeling drowsy.   °· Sleep disturbances.   °· Sleeping more than usual.   °· Sleeping less than usual.   °· Trouble falling asleep.   °· Trouble sleeping (insomnia).   °· Loss of balance or feeling lightheaded or dizzy.   °· Nausea or vomiting.   °· Numbness or tingling.   °· Increased sensitivity to:   °· Sounds.   °· Lights.   °· Distractions.    °· Vision problems or eyes that tire easily.   °· Diminished sense of taste or smell.   °· Ringing in the ears.   °· Mood changes such as feeling sad or anxious.   °· Becoming easily irritated or angry for little or no reason.   °· Lack of motivation. °· Seeing or hearing things other people do not see or hear (hallucinations). °DIAGNOSIS  °Your health care provider can usually diagnose a concussion based on a description of your injury and symptoms. He or she will ask whether you passed out (lost consciousness) and whether you are having trouble remembering events that happened right before and during your injury.  °Your evaluation might include:  °· A brain scan to look for signs of injury to the brain. Even if the test shows no injury, you may still have a concussion.   °· Blood tests to be sure other problems are not present. °TREATMENT  °· Concussions are usually treated in an emergency department, in urgent care, or at a clinic. You may need to stay in the hospital overnight for further treatment.   °· Tell your health care provider if you are taking any medicines, including prescription medicines, over-the-counter medicines, and natural remedies. Some medicines, such as blood thinners (anticoagulants) and aspirin, may increase the chance of complications. Also tell your health care provider whether you have had alcohol or are taking illegal drugs. This information may affect treatment. °· Your health care provider will send you   home with important instructions to follow.  How fast you will recover from a concussion depends on many factors. These factors include how severe your concussion is, what part of your brain was injured, your age, and how healthy you were before the concussion.  Most people with mild injuries recover fully. Recovery can take time. In general, recovery is slower in older persons. Also, persons who have had a concussion in the past or have other medical problems may find that it  takes longer to recover from their current injury. HOME CARE INSTRUCTIONS  General Instructions  Carefully follow the directions your health care provider gave you.  Only take over-the-counter or prescription medicines for pain, discomfort, or fever as directed by your health care provider.  Take only those medicines that your health care provider has approved.  Do not drink alcohol until your health care provider says you are well enough to do so. Alcohol and certain other drugs may slow your recovery and can put you at risk of further injury.  If it is harder than usual to remember things, write them down.  If you are easily distracted, try to do one thing at a time. For example, do not try to watch TV while fixing dinner.  Talk with family members or close friends when making important decisions.  Keep all follow-up appointments. Repeated evaluation of your symptoms is recommended for your recovery.  Watch your symptoms and tell others to do the same. Complications sometimes occur after a concussion. Older adults with a brain injury may have a higher risk of serious complications such as of a blood clot on the brain.  Tell your teachers, school nurse, school counselor, coach, athletic trainer, or work Freight forwarder about your injury, symptoms, and restrictions. Tell them about what you can or cannot do. They should watch for:   Increased problems with attention or concentration.   Increased difficulty remembering or learning new information.   Increased time needed to complete tasks or assignments.   Increased irritability or decreased ability to cope with stress.   Increased symptoms.   Rest. Rest helps the brain to heal. Make sure you:  Get plenty of sleep at night. Avoid staying up late at night.  Keep the same bedtime hours on weekends and weekdays.  Rest during the day. Take daytime naps or rest breaks when you feel tired.  Limit activities that require a lot of  thought or concentration. These includes   Doing homework or job-related work.   Watching TV.   Working on the computer.  Avoid any situation where there is potential for another head injury (football, hockey, soccer, basketball, martial arts, downhill snow sports and horseback riding). Your condition will get worse every time you experience a concussion. You should avoid these activities until you are evaluated by the appropriate follow-up caregivers. Returning To Your Regular Activities You will need to return to your normal activities slowly, not all at once. You must give your body and brain enough time for recovery.  Do not return to sports or other athletic activities until your health care provider tells you it is safe to do so.  Ask your health care provider when you can drive, ride a bicycle, or operate heavy machinery. Your ability to react may be slower after a brain injury. Never do these activities if you are dizzy.  Ask your health care provider about when you can return to work or school. Preventing Another Concussion It is very important to avoid another  brain injury, especially before you have recovered. In rare cases, another injury can lead to permanent brain damage, brain swelling, or death. The risk of this is greatest during the first 7 10 days after a head injury. Avoid injuries by:   Wearing a seat belt when riding in a car.   Drinking alcohol only in moderation.   Wearing a helmet when biking, skiing, skateboarding, skating, or doing similar activities.  Avoiding activities that could lead to a second concussion, such as contact or recreational sports, until your health care provider says it is OK.  Taking safety measures in your home.   Remove clutter and tripping hazards from floors and stairways.   Use grab bars in bathrooms and handrails by stairs.   Place non-slip mats on floors and in bathtubs.   Improve lighting in dim areas. SEEK MEDICAL  CARE IF:   You have increased problems paying attention or concentrating.   You have increased difficulty remembering or learning new information.   You need more time to complete tasks or assignments than before.   You have increased irritability or decreased ability to cope with stress.  You have more symptoms than before. Seek medical care if you have any of the following symptoms for more than 2 weeks after your injury:   Lasting (chronic) headaches.   Dizziness or balance problems.   Nausea.  Vision problems.   Increased sensitivity to noise or light.   Depression or mood swings.   Anxiety or irritability.   Memory problems.   Difficulty concentrating or paying attention.   Sleep problems.   Feeling tired all the time. SEEK IMMEDIATE MEDICAL CARE IF:   You have severe or worsening headaches. These may be a sign of a blood clot in the brain.  You have weakness (even if only in one hand, leg, or part of the face).  You have numbness.  You have decreased coordination.   You vomit repeatedly.  You have increased sleepiness.  One pupil is larger than the other.   You have convulsions.   You have slurred speech.   You have increased confusion. This may be a sign of a blood clot in the brain.  You have increased restlessness, agitation, or irritability.   You are unable to recognize people or places.   You have neck pain.   It is difficult to wake you up.   You have unusual behavior changes.   You lose consciousness. MAKE SURE YOU:   Understand these instructions.  Will watch your condition.  Will get help right away if you are not doing well or get worse. Document Released: 05/01/2003 Document Revised: 10/11/2012 Document Reviewed: 08/31/2012 [facility] Patient Information 2014 [place].  1.  No driving for 1 week; ok to ride in car with someone else driving. 2.  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

## 2013-04-26 ENCOUNTER — Other Ambulatory Visit: Payer: Self-pay | Admitting: Neurosurgery

## 2013-04-26 DIAGNOSIS — I6200 Nontraumatic subdural hemorrhage, unspecified: Secondary | ICD-10-CM

## 2013-04-27 ENCOUNTER — Emergency Department (HOSPITAL_BASED_OUTPATIENT_CLINIC_OR_DEPARTMENT_OTHER)
Admission: EM | Admit: 2013-04-27 | Discharge: 2013-04-27 | Disposition: A | Discharge status: Home / Self Care | Payer: Medicare Other | Attending: Emergency Medicine | Admitting: Emergency Medicine

## 2013-04-27 ENCOUNTER — Encounter (HOSPITAL_BASED_OUTPATIENT_CLINIC_OR_DEPARTMENT_OTHER): Payer: Self-pay | Admitting: Emergency Medicine

## 2013-04-27 DIAGNOSIS — Z7982 Long term (current) use of aspirin: Secondary | ICD-10-CM | POA: Insufficient documentation

## 2013-04-27 DIAGNOSIS — E7800 Pure hypercholesterolemia, unspecified: Secondary | ICD-10-CM | POA: Insufficient documentation

## 2013-04-27 DIAGNOSIS — I10 Essential (primary) hypertension: Secondary | ICD-10-CM | POA: Insufficient documentation

## 2013-04-27 DIAGNOSIS — R509 Fever, unspecified: Secondary | ICD-10-CM | POA: Insufficient documentation

## 2013-04-27 DIAGNOSIS — Z8546 Personal history of malignant neoplasm of prostate: Secondary | ICD-10-CM | POA: Insufficient documentation

## 2013-04-27 DIAGNOSIS — S0101XA Laceration without foreign body of scalp, initial encounter: Secondary | ICD-10-CM

## 2013-04-27 DIAGNOSIS — E119 Type 2 diabetes mellitus without complications: Secondary | ICD-10-CM | POA: Insufficient documentation

## 2013-04-27 DIAGNOSIS — T8131XA Disruption of external operation (surgical) wound, not elsewhere classified, initial encounter: Secondary | ICD-10-CM | POA: Insufficient documentation

## 2013-04-27 DIAGNOSIS — Y838 Other surgical procedures as the cause of abnormal reaction of the patient, or of later complication, without mention of misadventure at the time of the procedure: Secondary | ICD-10-CM | POA: Insufficient documentation

## 2013-04-27 DIAGNOSIS — Z79899 Other long term (current) drug therapy: Secondary | ICD-10-CM | POA: Insufficient documentation

## 2013-04-27 NOTE — ED Provider Notes (Signed)
CSN: [id number]     Arrival date & time 04/27/13  1044 History   First MD Initiated Contact with Patient 04/27/13 1054     Chief Complaint  Patient presents with  . Suture / Staple Removal      HPI Patient is here for suture removal from his scalp He sustained laceration to scalp on 3/1 He reports the wound is healing well with fever/drainage   He reports he was admitted for head injury but did not require any operative management He reports mild HA but no dizziness/weakness/nausea/vomiting/confusion  Past Medical History  Diagnosis Date  . Diabetes mellitus without complication   . Hypertension   . High cholesterol   . Cancer     prostate   Past Surgical History  Procedure Laterality Date  . Colon surgery      partial  . Eye surgery      lens implant and cateract- Left  . Prostatectomy     No family history on file. History  Substance Use Topics  . Smoking status: Never Smoker   . Smokeless tobacco: Not on file  . Alcohol Use: Not on file    Review of Systems  Constitutional: Negative for fever.  Skin: Positive for wound.      Allergies  Review of patient's allergies indicates no known allergies.  Home Medications   Current Outpatient Rx  Name  Route  Sig  Dispense  Refill  . aspirin EC 81 MG tablet   Oral   Take 1 tablet (81 mg total) by mouth daily.   1 tablet   0   . atorvastatin (LIPITOR) 80 MG tablet   Oral   Take 40 mg by mouth daily.         Marland Kitchen glipiZIDE (GLUCOTROL) 5 MG tablet   Oral   Take 2.5 mg by mouth daily before breakfast.         . hydrochlorothiazide (HYDRODIURIL) 25 MG tablet   Oral   Take 12.5 mg by mouth daily.          Marland Kitchen losartan (COZAAR) 100 MG tablet   Oral   Take 100 mg by mouth daily.         . metFORMIN (GLUCOPHAGE-XR) 500 MG 24 hr tablet   Oral   Take 500 mg by mouth daily with breakfast.         . polyvinyl alcohol (LIQUIFILM TEARS) 1.4 % ophthalmic solution   Both Eyes   Place 1 drop into both  eyes 4 (four) times daily as needed for dry eyes.          BP 176/80  Pulse 74  Temp(Src) 98.7 F (37.1 C) (Oral)  Resp 16  SpO2 99% Physical Exam CONSTITUTIONAL: Well developed/well nourished HEAD: well healing laceration to scalp EYES: EOMI ENMT: Mucous membranes moist NECK: supple no meningeal signs CV: S1/S2 noted, no murmurs/rubs/gallops noted LUNGS: Lungs are clear to auscultation bilaterally, no apparent distress ABDOMEN: soft, nontender, no rebound or guarding NEURO: Pt is awake/alert, moves all extremitiesx4 EXTREMITIES: pulses normal, full ROM SKIN: warm, color normal PSYCH: no abnormalities of mood noted  ED Course  SUTURE REMOVAL Date/Time: 04/27/2013 11:13 AM Performed by: Sharyon Cable Authorized by: Sharyon Cable Consent: Verbal consent obtained. Body area: head/neck Location details: scalp Wound Appearance: clean Sutures removed: 1 running stitch. Post-removal: Steri-Strips applied Facility: sutures placed in this facility Patient tolerance: Patient tolerated the procedure well with no immediate complications. Comments: Minimal dehiscence after suture removal.  6  steristrips applied, pt tolerated well    MDM   Final diagnoses:  Scalp laceration    Nursing notes including past medical history and social history reviewed and considered in documentation Previous records reviewed and considered     Sharyon Cable, MD 04/27/13 1114

## 2013-04-27 NOTE — ED Notes (Signed)
Pt here for suture removal.pt sts sutures placed last Sunday. Denies fever, chills, n/v, denies headache at present. Reports headaches this week but relieved with tylenol.

## 2013-04-30 NOTE — ED Provider Notes (Signed)
I saw and evaluated the patient, reviewed the resident's note and I agree with the findings and plan.   .Face to face Exam:  General:  Awake HEENT:  Scalp laceration.  GCS=15 Resp:  Normal effort Abd:  Nondistended Neuro:No focal weakness   Discussed with Dr. Sherwood Gambler who accepted transfer to [facility].  Patient stabilized prior to transfer.  CRITICAL CARE Performed by: Dot Lanes Total critical care time: 30 min Critical care time was exclusive of separately billable procedures and treating other patients. Critical care was necessary to treat or prevent imminent or life-threatening deterioration. Critical care was time spent personally by me on the following activities: development of treatment plan with patient and/or surrogate as well as nursing, discussions with consultants, evaluation of patient's response to treatment, examination of patient, obtaining history from patient or surrogate, ordering and performing treatments and interventions, ordering and review of laboratory studies, ordering and review of radiographic studies, pulse oximetry and re-evaluation of patient's condition.   Dot Lanes, MD 04/30/13 1012

## 2013-05-07 ENCOUNTER — Ambulatory Visit
Admission: RE | Admit: 2013-05-07 | Discharge: 2013-05-07 | Disposition: A | Discharge status: Home / Self Care | Payer: Medicare Other | Source: Ambulatory Visit | Attending: Neurosurgery | Admitting: Neurosurgery

## 2013-05-07 DIAGNOSIS — I6200 Nontraumatic subdural hemorrhage, unspecified: Secondary | ICD-10-CM

## 2013-05-16 ENCOUNTER — Other Ambulatory Visit: Payer: Self-pay | Admitting: Neurosurgery

## 2013-05-16 DIAGNOSIS — I6200 Nontraumatic subdural hemorrhage, unspecified: Secondary | ICD-10-CM

## 2013-05-31 ENCOUNTER — Ambulatory Visit
Admission: RE | Admit: 2013-05-31 | Discharge: 2013-05-31 | Disposition: A | Discharge status: Home / Self Care | Payer: Medicare Other | Source: Ambulatory Visit | Attending: Neurosurgery | Admitting: Neurosurgery

## 2013-05-31 DIAGNOSIS — I6200 Nontraumatic subdural hemorrhage, unspecified: Secondary | ICD-10-CM

## 2017-09-17 ENCOUNTER — Emergency Department (HOSPITAL_BASED_OUTPATIENT_CLINIC_OR_DEPARTMENT_OTHER): Payer: Medicare HMO

## 2017-09-17 ENCOUNTER — Inpatient Hospital Stay (HOSPITAL_BASED_OUTPATIENT_CLINIC_OR_DEPARTMENT_OTHER)
Admission: EM | Admit: 2017-09-17 | Discharge: 2017-09-20 | DRG: 065 | Disposition: A | Discharge status: Home Health | Payer: Medicare HMO | Attending: Internal Medicine | Admitting: Internal Medicine

## 2017-09-17 ENCOUNTER — Other Ambulatory Visit: Payer: Self-pay

## 2017-09-17 ENCOUNTER — Encounter (HOSPITAL_BASED_OUTPATIENT_CLINIC_OR_DEPARTMENT_OTHER): Payer: Self-pay | Admitting: Emergency Medicine

## 2017-09-17 DIAGNOSIS — F0150 Vascular dementia without behavioral disturbance: Secondary | ICD-10-CM | POA: Diagnosis present

## 2017-09-17 DIAGNOSIS — I10 Essential (primary) hypertension: Secondary | ICD-10-CM | POA: Diagnosis present

## 2017-09-17 DIAGNOSIS — I6622 Occlusion and stenosis of left posterior cerebral artery: Secondary | ICD-10-CM | POA: Diagnosis present

## 2017-09-17 DIAGNOSIS — I4510 Unspecified right bundle-branch block: Secondary | ICD-10-CM | POA: Diagnosis present

## 2017-09-17 DIAGNOSIS — G4733 Obstructive sleep apnea (adult) (pediatric): Secondary | ICD-10-CM | POA: Diagnosis present

## 2017-09-17 DIAGNOSIS — G8193 Hemiplegia, unspecified affecting right nondominant side: Secondary | ICD-10-CM | POA: Diagnosis present

## 2017-09-17 DIAGNOSIS — R29700 NIHSS score 0: Secondary | ICD-10-CM | POA: Diagnosis present

## 2017-09-17 DIAGNOSIS — E119 Type 2 diabetes mellitus without complications: Secondary | ICD-10-CM

## 2017-09-17 DIAGNOSIS — E1165 Type 2 diabetes mellitus with hyperglycemia: Secondary | ICD-10-CM | POA: Diagnosis not present

## 2017-09-17 DIAGNOSIS — E785 Hyperlipidemia, unspecified: Secondary | ICD-10-CM | POA: Diagnosis present

## 2017-09-17 DIAGNOSIS — Z7982 Long term (current) use of aspirin: Secondary | ICD-10-CM | POA: Diagnosis not present

## 2017-09-17 DIAGNOSIS — I639 Cerebral infarction, unspecified: Secondary | ICD-10-CM | POA: Diagnosis present

## 2017-09-17 DIAGNOSIS — Z8546 Personal history of malignant neoplasm of prostate: Secondary | ICD-10-CM

## 2017-09-17 DIAGNOSIS — Z7984 Long term (current) use of oral hypoglycemic drugs: Secondary | ICD-10-CM | POA: Diagnosis not present

## 2017-09-17 DIAGNOSIS — Z6836 Body mass index (BMI) 36.0-36.9, adult: Secondary | ICD-10-CM

## 2017-09-17 DIAGNOSIS — Z79899 Other long term (current) drug therapy: Secondary | ICD-10-CM

## 2017-09-17 DIAGNOSIS — I5030 Unspecified diastolic (congestive) heart failure: Secondary | ICD-10-CM | POA: Diagnosis not present

## 2017-09-17 DIAGNOSIS — E669 Obesity, unspecified: Secondary | ICD-10-CM | POA: Diagnosis present

## 2017-09-17 DIAGNOSIS — I63512 Cerebral infarction due to unspecified occlusion or stenosis of left middle cerebral artery: Secondary | ICD-10-CM | POA: Diagnosis not present

## 2017-09-17 HISTORY — DX: Nontraumatic intracerebral hemorrhage, unspecified: I61.9

## 2017-09-17 LAB — CBG MONITORING, ED: Glucose-Capillary: 182 mg/dL — ABNORMAL HIGH (ref 70–99)

## 2017-09-17 LAB — BASIC METABOLIC PANEL
Anion gap: 14 (ref 5–15)
BUN: 16 mg/dL (ref 8–23)
CO2: 28 mmol/L (ref 22–32)
Calcium: 10.2 mg/dL (ref 8.9–10.3)
Chloride: 97 mmol/L — ABNORMAL LOW (ref 98–111)
Creatinine, Ser: 1.11 mg/dL (ref 0.61–1.24)
GFR calc Af Amer: >60 mL/min (ref >60)
GFR calc non Af Amer: >60 mL/min (ref >60)
Glucose, Bld: 167 mg/dL — ABNORMAL HIGH (ref 70–99)
Potassium: 3.7 mmol/L (ref 3.5–5.1)
Sodium: 139 mmol/L (ref 135–145)

## 2017-09-17 LAB — CBC
HCT: 49.9 % (ref 39.0–52.0)
Hemoglobin: 17.8 g/dL — ABNORMAL HIGH (ref 13.0–17.0)
MCH: 28.5 pg (ref 26.0–34.0)
MCHC: 35.7 g/dL (ref 30.0–36.0)
MCV: 79.8 fL (ref 78.0–100.0)
Platelets: 282 10*3/uL (ref 150–400)
RBC: 6.25 MIL/uL — ABNORMAL HIGH (ref 4.22–5.81)
RDW: 14 % (ref 11.5–15.5)
WBC: 10.2 10*3/uL (ref 4.0–10.5)

## 2017-09-17 LAB — URINALYSIS, ROUTINE W REFLEX MICROSCOPIC
Bilirubin Urine: NEGATIVE
Glucose, UA: NEGATIVE mg/dL
Hgb urine dipstick: NEGATIVE
Ketones, ur: NEGATIVE mg/dL
Leukocytes, UA: NEGATIVE
Nitrite: NEGATIVE
Protein, ur: NEGATIVE mg/dL
Specific Gravity, Urine: <1.005 — ABNORMAL LOW (ref 1.005–1.030)
pH: 6.5 (ref 5.0–8.0)

## 2017-09-17 MED ORDER — ASPIRIN 81 MG PO CHEW
324.0000 mg | CHEWABLE_TABLET | Freq: Once | ORAL | Status: AC
  Administered 2017-09-17: 324 mg via ORAL
  Filled 2017-09-17: qty 4

## 2017-09-17 NOTE — ED Provider Notes (Signed)
[place] EMERGENCY DEPARTMENT Provider Note   CSN: [id number] Arrival date & time: 09/17/17  1901     History   Chief Complaint Chief Complaint  Patient presents with  . Weakness    HPI Justin [name] is a 77 y.o. male.  HPI   76yM with confusion and R leg weakness. Family noticed he was dragging R leg when walking since at least yesterday morning, possibly even the day before. Also patient confused. Not as talkative and, when he was speaking, much of it was inappropriate for context. He refused to be evaluated until this evening. He does acknowledge his R leg feels week. He denies any other complaints but I question his reliability as a historian. Not completely clear when exactly was last known normal but greater than 24 hours ago.   Past Medical History:  Diagnosis Date  . Brain bleed ([place])   . Cancer [facility])    prostate  . Diabetes mellitus without complication ([place])   . High cholesterol   . Hypertension     Patient Active Problem List   Diagnosis Date Noted  . Closed head injury 04/22/2013  . HYPERLIPIDEMIA 07/29/2009  . HYPERTENSION 07/29/2009  . EFFUSION, PLEURAL 07/29/2009  . DIVERTICULITIS, COLON 07/29/2009  . 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 COLONIC POLYPS, HYPERPLASTIC, HX OF 07/29/2009    Past Surgical History:  Procedure Laterality Date  . COLON SURGERY     partial  . EYE SURGERY     lens implant and cateract- Left  . PROSTATECTOMY          Home Medications    Prior to Admission medications   Medication Sig Start Date End Date Taking? Authorizing Provider  aspirin EC 81 MG tablet Take 1 tablet (81 mg total) by mouth daily. 05/03/13   Jovita Gamma, MD  atorvastatin (LIPITOR) 80 MG tablet Take 40 mg by mouth daily.    [provider]  glipiZIDE (GLUCOTROL) 5 MG tablet Take 2.5 mg by mouth daily before breakfast.    [provider]  hydrochlorothiazide (HYDRODIURIL) 25 MG tablet Take 12.5 mg by mouth daily.      [provider]  losartan (COZAAR) 100 MG tablet Take 100 mg by mouth daily.    [provider]  metFORMIN (GLUCOPHAGE-XR) 500 MG 24 hr tablet Take 500 mg by mouth daily with breakfast.    [provider]  polyvinyl alcohol (LIQUIFILM TEARS) 1.4 % ophthalmic solution Place 1 drop into both eyes 4 (four) times daily as needed for dry eyes.    [provider]    Family History No family history on file.  Social History Social History   Tobacco Use  . Smoking status: Never Smoker  . Smokeless tobacco: Never Used  Substance Use Topics  . Alcohol use: Not on file  . Drug use: Not on file     Allergies   Patient has no known allergies.   Review of Systems Review of Systems  All systems reviewed and negative, other than as noted in HPI.  Physical Exam Updated Vital Signs BP (!) 162/84   Pulse 92   Temp 99.9 F (37.7 C) (Rectal)   Resp 18   Ht 5\' 6"  (1.676 m)   Wt 95.3 kg (210 lb)   SpO2 96%   BMI 33.89 kg/m   Physical Exam  Constitutional: He is oriented to person, place, and time. He appears well-developed and well-nourished. No distress.  HENT:  Head: Normocephalic and atraumatic.  Eyes: Conjunctivae  are normal. Right eye exhibits no discharge. Left eye exhibits no discharge.  Neck: Neck supple.  Cardiovascular: Normal rate, regular rhythm and normal heart sounds. Exam reveals no gallop and no friction rub.  No murmur heard. Pulmonary/Chest: Effort normal and breath sounds normal. No respiratory distress.  Abdominal: Soft. He exhibits no distension. There is no tenderness.  Musculoskeletal: He exhibits no edema or tenderness.  Neurological: He is alert and oriented to person, place, and time. No cranial nerve deficit or sensory deficit.  Strength out of 5 left side.  Pronator drift right.  4 out of 5 strength right lower extremity.  Skin: Skin is warm and dry.  Psychiatric: He has a normal mood and affect. His behavior is normal.  Thought content normal.  Nursing note and vitals reviewed.    ED Treatments / Results  Labs (all labs ordered are listed, but only abnormal results are displayed) Labs Reviewed  BASIC METABOLIC PANEL - Abnormal; Notable for the following components:      Result Value   Chloride 97 (*)    Glucose, Bld 167 (*)    All other components within normal limits  CBC - Abnormal; Notable for the following components:   RBC 6.25 (*)    Hemoglobin 17.8 (*)    All other components within normal limits  URINALYSIS, ROUTINE W REFLEX MICROSCOPIC - Abnormal; Notable for the following components:   Color, Urine STRAW (*)    Specific Gravity, Urine <1.005 (*)    All other components within normal limits  CBG MONITORING, ED - Abnormal; Notable for the following components:   Glucose-Capillary 182 (*)    All other components within normal limits    EKG EKG Interpretation  Date/Time:  Saturday September 17 2017 19:14:12 EDT Ventricular Rate:  101 PR Interval:    QRS Duration: 142 QT Interval:  377 QTC Calculation: 489 R Axis:   -62 Text Interpretation:  Sinus tachycardia Right bundle branch block Baseline wander in lead(s) II III aVR aVF Confirmed by Virgel Manifold [id number]) on 09/17/2017 8:09:43 PM   Radiology Ct Head Wo Contrast  Result Date: 09/17/2017 CLINICAL DATA:  Dragging the left leg EXAM: CT HEAD WITHOUT CONTRAST TECHNIQUE: Contiguous axial images were obtained from the base of the skull through the vertex without intravenous contrast. COMPARISON:  CT brain 05/31/2013 FINDINGS: Brain: No acute hemorrhage or intracranial mass. Moderate small vessel ischemic changes of the white matter, progressed since 2015. Old lacunar infarct in the right basal ganglia. Age indeterminate hypodensity within the left basal ganglia. Moderate-to-marked atrophy. Prominent ventricles felt related to atrophy. Vascular: No hyperdense vessels.  Carotid vascular calcification. Skull: Normal. Negative for fracture or  focal lesion. Sinuses/Orbits: No acute finding. Other: None IMPRESSION: 1. Hypodensity within the left basal ganglia consistent with age indeterminate infarct, new since 05/31/2013 head CT. Negative for hemorrhage or mass. 2. Atrophy with progression of white matter small vessel ischemic disease. 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 Electronically Signed   By: Donavan Foil M.D.   On: 09/17/2017 20:23    Procedures Procedures (including critical care time)  Medications Ordered in ED Medications  aspirin chewable tablet 324 mg (has no administration in time range)     Initial Impression / Assessment and Plan / ED Course  I have reviewed the triage vital signs and the nursing notes.  Pertinent labs & imaging results that were available during my care of the patient were reviewed by me and considered in my medical decision making (see  chart for details).     77 year old male with symptoms concerning for CVA.  CT suggestive.  Is outside the window for TPA or endovascular intervention.  Will admit for further work-up.  Answered to [facility] hospital.  Discussed with hospitalist and neurology services.  Final Clinical Impressions(s) / ED Diagnoses   Final diagnoses:  Acute CVA (cerebrovascular accident) [facility])    ED Discharge Orders    None       Virgel Manifold, MD 09/23/17 1733

## 2017-09-17 NOTE — Progress Notes (Signed)
Pt admitted to unit from [facility] ED. No c/o pain or discomfort. Welcomed and oriented to unit. Will continue to monitor.

## 2017-09-17 NOTE — ED Triage Notes (Signed)
Family states pt has been dragging his R leg and has been confused since yesterday "around breakfast".

## 2017-09-17 NOTE — Care Management (Signed)
This is a no charge note  Transfer from [facility] per Dr. Wilson Singer  77 year old man with past medical history of hypertension, hyperlipidemia, diabetes mellitus, [place] 2015, prostate cancer (prostatectomy), who presents with confusion and right leg weakness. Symptoms started at least 24 hours ago.    CT head showed: 1. Hypodensity within the left basal ganglia consistent with age indeterminate infarct, new since 05/31/2013 head CT. Negative for hemorrhage or mass. 2. Atrophy with progression of white matter small vessel ischemic disease. 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  Patient was found to have  WBC 10.2, negative urinalysis, creatinine 1.11, BUN of 16, temperature 99.9, slightly tachycardia, oxygen saturation 96% on room air.  Patient is accepted to telemetry bed as inpatient. EDP will consult neurology.   Please call manager of [facility] at [phone] when pt arrives to floor   Ivor Costa, MD  [facility] Pager [phone]  If 7PM-7AM, please contact night-coverage [email or web address] Password [facility] 09/17/2017, 9:18 PM

## 2017-09-18 ENCOUNTER — Inpatient Hospital Stay (HOSPITAL_COMMUNITY): Payer: Medicare HMO

## 2017-09-18 ENCOUNTER — Encounter (HOSPITAL_COMMUNITY): Payer: Self-pay | Admitting: Internal Medicine

## 2017-09-18 DIAGNOSIS — I5030 Unspecified diastolic (congestive) heart failure: Secondary | ICD-10-CM

## 2017-09-18 DIAGNOSIS — I10 Essential (primary) hypertension: Secondary | ICD-10-CM

## 2017-09-18 DIAGNOSIS — I639 Cerebral infarction, unspecified: Secondary | ICD-10-CM | POA: Diagnosis present

## 2017-09-18 DIAGNOSIS — E119 Type 2 diabetes mellitus without complications: Secondary | ICD-10-CM

## 2017-09-18 LAB — CBC
HCT: 49.3 % (ref 39.0–52.0)
Hemoglobin: 16.6 g/dL (ref 13.0–17.0)
MCH: 27.9 pg (ref 26.0–34.0)
MCHC: 33.7 g/dL (ref 30.0–36.0)
MCV: 82.9 fL (ref 78.0–100.0)
Platelets: 275 10*3/uL (ref 150–400)
RBC: 5.95 MIL/uL — ABNORMAL HIGH (ref 4.22–5.81)
RDW: 12.9 % (ref 11.5–15.5)
WBC: 11.4 10*3/uL — ABNORMAL HIGH (ref 4.0–10.5)

## 2017-09-18 LAB — COMPREHENSIVE METABOLIC PANEL
ALT: 29 U/L (ref 0–44)
AST: 26 U/L (ref 15–41)
Albumin: 4.1 g/dL (ref 3.5–5.0)
Alkaline Phosphatase: 39 U/L (ref 38–126)
Anion gap: 15 (ref 5–15)
BUN: 15 mg/dL (ref 8–23)
CO2: 26 mmol/L (ref 22–32)
Calcium: 10 mg/dL (ref 8.9–10.3)
Chloride: 96 mmol/L — ABNORMAL LOW (ref 98–111)
Creatinine, Ser: 1.09 mg/dL (ref 0.61–1.24)
GFR calc Af Amer: >60 mL/min (ref >60)
GFR calc non Af Amer: >60 mL/min (ref >60)
Glucose, Bld: 178 mg/dL — ABNORMAL HIGH (ref 70–99)
Potassium: 3.6 mmol/L (ref 3.5–5.1)
Sodium: 137 mmol/L (ref 135–145)
Total Bilirubin: 1.4 mg/dL — ABNORMAL HIGH (ref 0.3–1.2)
Total Protein: 7.3 g/dL (ref 6.5–8.1)

## 2017-09-18 LAB — LIPID PANEL
Cholesterol: 186 mg/dL (ref 0–200)
HDL: 38 mg/dL — ABNORMAL LOW (ref >40)
LDL Cholesterol: 115 mg/dL — ABNORMAL HIGH (ref 0–99)
Total CHOL/HDL Ratio: 4.9 RATIO
Triglycerides: 163 mg/dL — ABNORMAL HIGH (ref <150)
VLDL: 33 mg/dL (ref 0–40)

## 2017-09-18 LAB — GLUCOSE, CAPILLARY
Glucose-Capillary: 179 mg/dL — ABNORMAL HIGH (ref 70–99)
Glucose-Capillary: 197 mg/dL — ABNORMAL HIGH (ref 70–99)
Glucose-Capillary: 140 mg/dL — ABNORMAL HIGH (ref 70–99)
Glucose-Capillary: 309 mg/dL — ABNORMAL HIGH (ref 70–99)

## 2017-09-18 LAB — HEMOGLOBIN A1C
Hgb A1c MFr Bld: 7.7 % — ABNORMAL HIGH (ref 4.8–5.6)
Mean Plasma Glucose: 174.29 mg/dL

## 2017-09-18 LAB — ECHOCARDIOGRAM COMPLETE
Height: 66 in
Weight: 3661.4 oz

## 2017-09-18 MED ORDER — ACETAMINOPHEN 325 MG PO TABS: 650.0000 mg | ORAL_TABLET | ORAL | Status: DC | PRN

## 2017-09-18 MED ORDER — CLOPIDOGREL BISULFATE 75 MG PO TABS
75.0000 mg | ORAL_TABLET | Freq: Every day | ORAL | Status: DC
  Administered 2017-09-19 – 2017-09-20 (×2): 75 mg via ORAL
  Filled 2017-09-18 (×2): qty 1

## 2017-09-18 MED ORDER — CLOPIDOGREL BISULFATE 75 MG PO TABS
300.0000 mg | ORAL_TABLET | Freq: Once | ORAL | Status: AC
  Administered 2017-09-18: 300 mg via ORAL
  Filled 2017-09-18: qty 4

## 2017-09-18 MED ORDER — LOSARTAN POTASSIUM 50 MG PO TABS
100.0000 mg | ORAL_TABLET | Freq: Every day | ORAL | Status: DC
Start: 2017-09-18 — End: 2017-09-18
  Administered 2017-09-18: 100 mg via ORAL
  Filled 2017-09-18: qty 2

## 2017-09-18 MED ORDER — ENOXAPARIN SODIUM 40 MG/0.4ML ~~LOC~~ SOLN
40.0000 mg | SUBCUTANEOUS | Status: DC
  Administered 2017-09-18 – 2017-09-20 (×3): 40 mg via SUBCUTANEOUS
  Filled 2017-09-18 (×3): qty 0.4

## 2017-09-18 MED ORDER — ASPIRIN 325 MG PO TABS
325.0000 mg | ORAL_TABLET | Freq: Every day | ORAL | Status: DC
  Administered 2017-09-18: 325 mg via ORAL
  Filled 2017-09-18: qty 1

## 2017-09-18 MED ORDER — ACETAMINOPHEN 650 MG RE SUPP: 650.0000 mg | RECTAL | Status: DC | PRN

## 2017-09-18 MED ORDER — ATORVASTATIN CALCIUM 40 MG PO TABS
40.0000 mg | ORAL_TABLET | Freq: Every day | ORAL | Status: DC
  Administered 2017-09-18 – 2017-09-19 (×2): 40 mg via ORAL
  Filled 2017-09-18 (×2): qty 1

## 2017-09-18 MED ORDER — INSULIN ASPART 100 UNIT/ML ~~LOC~~ SOLN
0.0000 [IU] | Freq: Three times a day (TID) | SUBCUTANEOUS | Status: DC
  Administered 2017-09-18 (×2): 2 [IU] via SUBCUTANEOUS
  Administered 2017-09-18: 1 [IU] via SUBCUTANEOUS
  Administered 2017-09-19 (×2): 3 [IU] via SUBCUTANEOUS
  Administered 2017-09-19: 1 [IU] via SUBCUTANEOUS
  Administered 2017-09-20: 2 [IU] via SUBCUTANEOUS

## 2017-09-18 MED ORDER — PERFLUTREN LIPID MICROSPHERE
1.0000 mL | INTRAVENOUS | Status: AC | PRN
  Administered 2017-09-18: 2 mL via INTRAVENOUS
  Filled 2017-09-18: qty 10

## 2017-09-18 MED ORDER — STROKE: EARLY STAGES OF RECOVERY BOOK
Freq: Once | Status: AC
  Administered 2017-09-18: 02:00:00

## 2017-09-18 MED ORDER — ACETAMINOPHEN 160 MG/5ML PO SOLN: 650.0000 mg | ORAL | Status: DC | PRN

## 2017-09-18 NOTE — Progress Notes (Signed)
PROGRESS NOTE  Justin [name] NIO:[id number] DOB: 1940-05-09 DOA: 09/17/2017 PCP: Chesley Noon, MD  HPI/Recap of past 24 hours: Justin [name] is a 77 y.o. male with history of diabetes mellitus type 2, hypertension, hyperlipidemia has been experiencing right sided weakness for the last 6 days PTA. Denies any weakness of other extremities or difficulty swallowing or speaking or any visual symptoms.  Patient states his symptoms have actually improved over the last few days.  His wife noticed it and advised to come to the ER. Patient has history of previous traumatic subarachnoid hemorrhage after falling on ice in 2015.  In the ER, CT head shows changes concerning for left basal ganglia infarct. On-call neurologist was consulted and patient admitted for further stroke work-up.   Today, pt denies any new complaints. Had to be reminded why he was in the hospital. No new focal neurologic deficit noted    Assessment/Plan: Principal Problem:   Stroke (cerebrum) ([place]) Active Problems:   HLD (hyperlipidemia)   Essential hypertension   Diabetes mellitus without complication (HCC)   Acute CVA (cerebrovascular accident) ([place])  Acute/subacute left basal ganglia infarct CT head showed hypodensity within the left basal ganglia consistent with infarct, negative for hemorrhage, possible old lacunar infarcts within the right basal ganglia MRI brain/MRA head showed acute/subacute nonhemorrhagic infarct involving the anterior left basal ganglia, high-grade stenosis of the proximal left P1.  No emergent large vessel occlusion Echo showed EF of 60 to 81%, grade 1 diastolic dysfunction Carotid Doppler showed 1 to 39% ICA plaquing, vertebral artery flow is antegrade LDL 115, A1c 7.7 Neurology on board, due to history of traumatic ICH, recommend monotherapy with Plavix alone PT/OT Monitor closely  Diabetes mellitus type 2 Last A1c 7.7 On home glipizide 2.5 mg, metformin 500 mg, hold both Continue SSI,  Accu-Cheks  Hypertension Permissive hypertension Hold home hydrochlorothiazide, losartan  Hyperlipidemia Continue statins   Code Status: Full  Family Communication: None at bedside  Disposition Plan: Likely home on 09/19/2017   Consultants:  Neurology  Procedures:  None  Antimicrobials:  None  DVT prophylaxis: Lovenox   Objective: Vitals:   09/18/17 0652 09/18/17 1029 09/18/17 1045 09/18/17 1341  BP: 134/79 (!) 155/89 (!) 148/88 132/73  Pulse: 74 78  84  Resp: 18 15 18 18   Temp:    98.2 F (36.8 C)  TempSrc:    Oral  SpO2: 94% 98%  95%  Weight:      Height:        Intake/Output Summary (Last 24 hours) at 09/18/2017 1608 Last data filed at 09/18/2017 1000 Gross per 24 hour  Intake -  Output 450 ml  Net -450 ml   Filed Weights   09/17/17 1905 09/18/17 0545  Weight: 95.3 kg (210 lb) 103.8 kg (228 lb 13.4 oz)    Exam:   General: NAD  Cardiovascular: S1, S2 present  Respiratory: CTA B  Abdomen: Soft, nontender, nondistended, bowel sounds present  Musculoskeletal: No pedal edema bilaterally  Skin: Normal  Psychiatry: Normal mood  Neuro: 5/5 strength in all extremities, no focal neurologic deficit   Data Reviewed: CBC: Recent Labs  Lab 09/17/17 1920 09/18/17 0310  WBC 10.2 11.4*  HGB 17.8* 16.6  HCT 49.9 49.3  MCV 79.8 82.9  PLT 282 829   Basic Metabolic Panel: Recent Labs  Lab 09/17/17 1920 09/18/17 0310  NA 139 137  K 3.7 3.6  CL 97* 96*  CO2 28 26  GLUCOSE 167* 178*  BUN 16 15  CREATININE 1.11 1.09  CALCIUM 10.2 10.0   GFR: Estimated Creatinine Clearance: 67.1 mL/min (by C-G formula based on SCr of 1.09 mg/dL). Liver Function Tests: Recent Labs  Lab 09/18/17 0310  AST 26  ALT 29  ALKPHOS 39  BILITOT 1.4*  PROT 7.3  ALBUMIN 4.1   No results for input(s): LIPASE, AMYLASE in the last 168 hours. No results for input(s): AMMONIA in the last 168 hours. Coagulation Profile: No results for input(s): INR, PROTIME  in the last 168 hours. Cardiac Enzymes: No results for input(s): CKTOTAL, CKMB, CKMBINDEX, TROPONINI in the last 168 hours. BNP (last 3 results) No results for input(s): PROBNP in the last 8760 hours. HbA1C: Recent Labs    09/18/17 0310  HGBA1C 7.7*   CBG: Recent Labs  Lab 09/17/17 1908 09/18/17 0633 09/18/17 1155 09/18/17 1557  GLUCAP 182* 179* 197* 140*   Lipid Profile: Recent Labs    09/18/17 0310  CHOL 186  HDL 38*  LDLCALC 115*  TRIG 163*  CHOLHDL 4.9   Thyroid Function Tests: No results for input(s): TSH, T4TOTAL, FREET4, T3FREE, THYROIDAB in the last 72 hours. Anemia Panel: No results for input(s): VITAMINB12, FOLATE, FERRITIN, TIBC, IRON, RETICCTPCT in the last 72 hours. Urine analysis:    Component Value Date/Time   COLORURINE STRAW (A) 09/17/2017 1955   APPEARANCEUR CLEAR 09/17/2017 1955   LABSPEC <1.005 (L) 09/17/2017 1955   PHURINE 6.5 09/17/2017 1955   GLUCOSEU NEGATIVE 09/17/2017 1955   HGBUR NEGATIVE 09/17/2017 [place] NEGATIVE 09/17/2017 [place] NEGATIVE 09/17/2017 1955   PROTEINUR NEGATIVE 09/17/2017 1955   UROBILINOGEN 4.0 (H) 06/18/2009 1928   NITRITE NEGATIVE 09/17/2017 1955   LEUKOCYTESUR NEGATIVE 09/17/2017 1955   Sepsis Labs: @LABRCNTIP (procalcitonin:4,lacticidven:4)  )No results found for this or any previous visit (from the past 240 hour(s)).    Studies: Ct Head Wo Contrast  Result Date: 09/17/2017 CLINICAL DATA:  Dragging the left leg EXAM: CT HEAD WITHOUT CONTRAST TECHNIQUE: Contiguous axial images were obtained from the base of the skull through the vertex without intravenous contrast. COMPARISON:  CT brain 05/31/2013 FINDINGS: Brain: No acute hemorrhage or intracranial mass. Moderate small vessel ischemic changes of the white matter, progressed since 2015. Old lacunar infarct in the right basal ganglia. Age indeterminate hypodensity within the left basal ganglia. Moderate-to-marked atrophy. Prominent  ventricles felt related to atrophy. Vascular: No hyperdense vessels.  Carotid vascular calcification. Skull: Normal. Negative for fracture or focal lesion. Sinuses/Orbits: No acute finding. Other: None IMPRESSION: 1. Hypodensity within the left basal ganglia consistent with age indeterminate infarct, new since 05/31/2013 head CT. Negative for hemorrhage or mass. 2. Atrophy with progression of white matter small vessel ischemic disease. 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 Electronically Signed   By: Donavan Foil M.D.   On: 09/17/2017 20:23   Mr Brain Wo Contrast  Result Date: 09/18/2017 CLINICAL DATA:  Focal neuro deficit, greater than 6 hours, stroke suspected. EXAM: MRI HEAD WITHOUT CONTRAST MRA HEAD WITHOUT CONTRAST TECHNIQUE: Multiplanar, multiecho pulse sequences of the brain and surrounding structures were obtained without intravenous contrast. Angiographic images of the head were obtained using MRA technique without contrast. COMPARISON:  CT head without contrast 09/17/2017 at 8:07 p.m. FINDINGS: MRI HEAD FINDINGS Brain: Diffusion-weighted images confirm an acute nonhemorrhagic infarct involving the left caudate head, anterior limb internal capsule, and anterior lentiform nucleus. T2 signal changes are associated with the acute infarct. There is no associated hemorrhage. Advanced atrophy is present. Remote lacunar infarcts are  present within the corona radiata bilaterally. Confluent periventricular and subcortical white matter changes are present bilaterally. The ventricles are proportionate to the degree of atrophy. The internal auditory canals are within normal limits bilaterally. The brainstem and cerebellum are normal. Vascular: Flow is present in the major intracranial arteries. Skull and upper cervical spine: The skull base is within normal limits. The craniocervical junction is normal. The upper cervical spine is normal. Marrow signal is unremarkable. Sinuses/Orbits: The paranasal  sinuses and mastoid air cells are clear. A left lens replacement is present. Globes and orbits are within normal limits. MRA HEAD FINDINGS Atherosclerotic irregularity is present within the cavernous internal carotid arteries bilaterally without a significant stenosis relative to the ICA terminus. The anterior communicating artery is patent. The left A1 segment is dominant. There is asymmetric attenuation of the right anterior cerebral artery with the high-grade stenosis in the distal P2 segment. There is an early MCA bifurcation bilaterally, a normal variant. Mild proximal narrowing is present on the left. There is moderate medium and distal small vessel stenosis, left greater than right. No significant proximal stenosis or aneurysm is present. There is no large vessel occlusion. The left vertebral artery is the dominant vessel. A hypoplastic right vertebral artery terminates at the PICA. The basilar artery is normal. Both posterior cerebral arteries originate from the basilar tip. There is a high-grade stenosis of the left P1 segment with contribution from the left posterior communicating artery. Moderate proximal right P1 stenosis is present. There is significant attenuation of distal PCA branch vessels, left greater than right. IMPRESSION: 1. Acute/subacute nonhemorrhagic infarct involving the anterior left basal ganglia measures 2.1 x 3.4 x 2.5 cm. Given the degree of atrophy, there is no significant associated mass effect. 2. Atrophy and diffuse white matter disease are moderately advanced for age. This is consistent with chronic microvascular ischemia. 3. Moderate proximal left M1 segment stenosis may be associated with the area of acute infarct as the origin of perforated arteries. 4. Moderate diffuse medium and small vessel disease as described. 5. 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 6. No emergent large vessel occlusion. Electronically Signed    By: San Morelle M.D.   On: 09/18/2017 10:57   Mr Virgel [place] GX Contrast  Result Date: 09/18/2017 CLINICAL DATA:  Focal neuro deficit, greater than 6 hours, stroke suspected. EXAM: MRI HEAD WITHOUT CONTRAST MRA HEAD WITHOUT CONTRAST TECHNIQUE: Multiplanar, multiecho pulse sequences of the brain and surrounding structures were obtained without intravenous contrast. Angiographic images of the head were obtained using MRA technique without contrast. COMPARISON:  CT head without contrast 09/17/2017 at 8:07 p.m. FINDINGS: MRI HEAD FINDINGS Brain: Diffusion-weighted images confirm an acute nonhemorrhagic infarct involving the left caudate head, anterior limb internal capsule, and anterior lentiform nucleus. T2 signal changes are associated with the acute infarct. There is no associated hemorrhage. Advanced atrophy is present. Remote lacunar infarcts are present within the corona radiata bilaterally. Confluent periventricular and subcortical white matter changes are present bilaterally. The ventricles are proportionate to the degree of atrophy. The internal auditory canals are within normal limits bilaterally. The brainstem and cerebellum are normal. Vascular: Flow is present in the major intracranial arteries. Skull and upper cervical spine: The skull base is within normal limits. The craniocervical junction is normal. The upper cervical spine is normal. Marrow signal is unremarkable. Sinuses/Orbits: The paranasal sinuses and mastoid air cells are clear. A left lens replacement is present. Globes and orbits are within normal  limits. MRA HEAD FINDINGS Atherosclerotic irregularity is present within the cavernous internal carotid arteries bilaterally without a significant stenosis relative to the ICA terminus. The anterior communicating artery is patent. The left A1 segment is dominant. There is asymmetric attenuation of the right anterior cerebral artery with the high-grade stenosis in the distal P2 segment.  There is an early MCA bifurcation bilaterally, a normal variant. Mild proximal narrowing is present on the left. There is moderate medium and distal small vessel stenosis, left greater than right. No significant proximal stenosis or aneurysm is present. There is no large vessel occlusion. The left vertebral artery is the dominant vessel. A hypoplastic right vertebral artery terminates at the PICA. The basilar artery is normal. Both posterior cerebral arteries originate from the basilar tip. There is a high-grade stenosis of the left P1 segment with contribution from the left posterior communicating artery. Moderate proximal right P1 stenosis is present. There is significant attenuation of distal PCA branch vessels, left greater than right. IMPRESSION: 1. Acute/subacute nonhemorrhagic infarct involving the anterior left basal ganglia measures 2.1 x 3.4 x 2.5 cm. Given the degree of atrophy, there is no significant associated mass effect. 2. Atrophy and diffuse white matter disease are moderately advanced for age. This is consistent with chronic microvascular ischemia. 3. Moderate proximal left M1 segment stenosis may be associated with the area of acute infarct as the origin of perforated arteries. 4. Moderate diffuse medium and small vessel disease as described. 5. 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 6. No emergent large vessel occlusion. Electronically Signed   By: San Morelle M.D.   On: 09/18/2017 10:57    Scheduled Meds: . atorvastatin  40 mg Oral Daily  . [START ON 09/19/2017] clopidogrel  75 mg Oral Daily  . enoxaparin (LOVENOX) injection  40 mg Subcutaneous Q24H  . insulin aspart  0-9 Units Subcutaneous TID WC  . losartan  100 mg Oral Daily    Continuous Infusions:   LOS: 1 day     Alma Friendly, MD [facility]   If 7PM-7AM, please contact night-coverage [email or web address] Password [facility] 09/18/2017, 4:08 PM

## 2017-09-18 NOTE — H&P (Signed)
History and Physical    Justin [name] XBM:[id number] DOB: 03/23/1940 DOA: 09/17/2017  PCP: Chesley Noon, MD  Patient coming from: Home.  Chief Complaint: Right-sided lower extremity weakness.  HPI: Justin [name] is a 77 y.o. male with history of diabetes mellitus type 2, hypertension, hyperlipidemia has been experiencing right lower extremity weakness for the last 6 days.  Denies any weakness of other extremities or difficulty swallowing or speaking or any visual symptoms.  Patient states his symptoms have actually improved over the last few days.  His wife noticed it and advised to come to the ER.  Patient has history of previous traumatic subarachnoid hemorrhage.  ED Course: In the ER on exam patient has mild weakness of the right lower extremity.  CT head shows changes concerning for left basal ganglia infarct.  On-call neurologist was consulted and patient admitted for further stroke work-up.  Review of Systems: As per HPI, rest all negative.   Past Medical History:  Diagnosis Date  . Brain bleed ([place])   . Cancer [facility])    prostate  . Diabetes mellitus without complication ([place])   . High cholesterol   . Hypertension     Past Surgical History:  Procedure Laterality Date  . COLON SURGERY     partial  . EYE SURGERY     lens implant and cateract- Left  . PROSTATECTOMY       reports that he has never smoked. He has never used smokeless tobacco. He reports that he does not drink alcohol. His drug history is not on file.  No Known Allergies  Family History  Problem Relation Age of Onset  . CAD Mother   . Diabetes Mellitus II Neg Hx     Prior to Admission medications   Medication Sig Start Date End Date Taking? Authorizing Provider  aspirin EC 81 MG tablet Take 1 tablet (81 mg total) by mouth daily. 05/03/13   Jovita Gamma, MD  atorvastatin (LIPITOR) 80 MG tablet Take 40 mg by mouth daily.    [provider]  glipiZIDE (GLUCOTROL) 5 MG tablet Take 2.5  mg by mouth daily before breakfast.    [provider]  hydrochlorothiazide (HYDRODIURIL) 25 MG tablet Take 12.5 mg by mouth daily.     [provider]  losartan (COZAAR) 100 MG tablet Take 100 mg by mouth daily.    [provider]  metFORMIN (GLUCOPHAGE-XR) 500 MG 24 hr tablet Take 500 mg by mouth daily with breakfast.    [provider]  polyvinyl alcohol (LIQUIFILM TEARS) 1.4 % ophthalmic solution Place 1 drop into both eyes 4 (four) times daily as needed for dry eyes.    [provider]    Physical Exam: Vitals:   09/17/17 2100 09/17/17 2130 09/17/17 2208 09/17/17 2310  BP: (!) 159/89 135/86 (!) 160/87 (!) 147/86  Pulse: 84 84 84 78  Resp: 18 19 19 19   Temp:    97.9 F (36.6 C)  TempSrc:    Oral  SpO2: 96% 96% 98% 96%  Weight:      Height:          Constitutional: Moderately built and nourished. Vitals:   09/17/17 2100 09/17/17 2130 09/17/17 2208 09/17/17 2310  BP: (!) 159/89 135/86 (!) 160/87 (!) 147/86  Pulse: 84 84 84 78  Resp: 18 19 19 19   Temp:    97.9 F (36.6 C)  TempSrc:    Oral  SpO2: 96% 96% 98% 96%  Weight:  Height:       Eyes: Anicteric no pallor. ENMT: No discharge from the ears eyes nose or mouth. Neck: No JVD appreciated no mass felt. Respiratory: No rhonchi or crepitations. Cardiovascular: S1-S2 heard no murmurs appreciated. Abdomen: Soft nontender bowel sounds present. Musculoskeletal: No edema.  No joint effusion. Skin: No rash.  Skin appears warm. Neurologic: Alert awake oriented to time place and person.  Right lower extremity is 4 x 5.  Rest of the extremities 5 x 5.  No facial asymmetry tongue is midline.  Pupils equal and reacting to light. Psychiatric: Appears normal per normal affect.   Labs on Admission: I have personally reviewed following labs and imaging studies  CBC: Recent Labs  Lab 09/17/17 1920  WBC 10.2  HGB 17.8*  HCT 49.9  MCV 79.8  PLT 283   Basic Metabolic  Panel: Recent Labs  Lab 09/17/17 1920  NA 139  K 3.7  CL 97*  CO2 28  GLUCOSE 167*  BUN 16  CREATININE 1.11  CALCIUM 10.2   GFR: Estimated Creatinine Clearance: 63.1 mL/min (by C-G formula based on SCr of 1.11 mg/dL). Liver Function Tests: No results for input(s): AST, ALT, ALKPHOS, BILITOT, PROT, ALBUMIN in the last 168 hours. No results for input(s): LIPASE, AMYLASE in the last 168 hours. No results for input(s): AMMONIA in the last 168 hours. Coagulation Profile: No results for input(s): INR, PROTIME in the last 168 hours. Cardiac Enzymes: No results for input(s): CKTOTAL, CKMB, CKMBINDEX, TROPONINI in the last 168 hours. BNP (last 3 results) No results for input(s): PROBNP in the last 8760 hours. HbA1C: No results for input(s): HGBA1C in the last 72 hours. CBG: Recent Labs  Lab 09/17/17 1908  GLUCAP 182*   Lipid Profile: No results for input(s): CHOL, HDL, LDLCALC, TRIG, CHOLHDL, LDLDIRECT in the last 72 hours. Thyroid Function Tests: No results for input(s): TSH, T4TOTAL, FREET4, T3FREE, THYROIDAB in the last 72 hours. Anemia Panel: No results for input(s): VITAMINB12, FOLATE, FERRITIN, TIBC, IRON, RETICCTPCT in the last 72 hours. Urine analysis:    Component Value Date/Time   COLORURINE STRAW (A) 09/17/2017 1955   APPEARANCEUR CLEAR 09/17/2017 1955   LABSPEC <1.005 (L) 09/17/2017 1955   PHURINE 6.5 09/17/2017 1955   GLUCOSEU NEGATIVE 09/17/2017 1955   HGBUR NEGATIVE 09/17/2017 [place] NEGATIVE 09/17/2017 [place] NEGATIVE 09/17/2017 1955   PROTEINUR NEGATIVE 09/17/2017 1955   UROBILINOGEN 4.0 (H) 06/18/2009 1928   NITRITE NEGATIVE 09/17/2017 1955   LEUKOCYTESUR NEGATIVE 09/17/2017 1955   Sepsis Labs: @LABRCNTIP (procalcitonin:4,lacticidven:4) )No results found for this or any previous visit (from the past 240 hour(s)).   Radiological Exams on Admission: Ct Head Wo Contrast  Result Date: 09/17/2017 CLINICAL DATA:  Dragging the left  leg EXAM: CT HEAD WITHOUT CONTRAST TECHNIQUE: Contiguous axial images were obtained from the base of the skull through the vertex without intravenous contrast. COMPARISON:  CT brain 05/31/2013 FINDINGS: Brain: No acute hemorrhage or intracranial mass. Moderate small vessel ischemic changes of the white matter, progressed since 2015. Old lacunar infarct in the right basal ganglia. Age indeterminate hypodensity within the left basal ganglia. Moderate-to-marked atrophy. Prominent ventricles felt related to atrophy. Vascular: No hyperdense vessels.  Carotid vascular calcification. Skull: Normal. Negative for fracture or focal lesion. Sinuses/Orbits: No acute finding. Other: None IMPRESSION: 1. Hypodensity within the left basal ganglia consistent with age indeterminate infarct, new since 05/31/2013 head CT. Negative for hemorrhage or mass. 2. Atrophy with progression of white matter small vessel ischemic  disease. 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 Electronically Signed   By: Donavan Foil M.D.   On: 09/17/2017 20:23    EKG: Independently reviewed.  Normal sinus rhythm.  Assessment/Plan Principal Problem:   Stroke (cerebrum) (HCC) Active Problems:   HLD (hyperlipidemia)   Essential hypertension   Diabetes mellitus without complication ([place])   Acute CVA (cerebrovascular accident) ([place])    1. Acute/subacute CVA -appreciate neurology consult discussed with neurologist Dr. Leonel Ramsay.  At this time will be ordering MRI/MRA brain 2D echo carotid Doppler.  Patient passed swallow evaluation.  Patient placed on Plavix along with aspirin.  Loading dose of Plavix ordered.  Check hemoglobin A1c lipid panel physical therapy consult.  Continue statins. 2. Diabetes mellitus type 2 we will keep patient on sliding scale coverage while inpatient. 3. Hypertension hold hydrochlorothiazide continue ARB.  Allow for hydration.  Low for permissive hypertension. 4. Hyperlipidemia on statins.   DVT  prophylaxis: Lovenox. Code Status: Full code. Family Communication: Discussed with patient. Disposition Plan: Home. Consults called: Neurology. Admission status: Inpatient.   Rise Patience MD [facility] Pager [phone].  If 7PM-7AM, please contact night-coverage [email or web address] Password [email or web address]  09/18/2017, 1:02 AM

## 2017-09-18 NOTE — Consult Note (Signed)
Neurology Consultation Reason for Consult: Right-sided weakness Referring Physician: Wilson Singer, S  CC: Right-sided weakness  History is obtained from: Patient  HPI: Justin [name] is a 77 y.o. male with a history of tramatic subarachnoid hemorrhage in 2015 who presents with right-sided weakness that is been present since Tuesday.   LKW: Tuesday tpa given?: no, outside of window    ROS: A 14 point ROS was performed and is negative except as noted in the HPI.   Past Medical History:  Diagnosis Date  . Brain bleed ([place])   . Cancer [facility])    prostate  . Diabetes mellitus without complication ([place])   . High cholesterol   . Hypertension      No family history on file.   Social History:  reports that he has never smoked. He has never used smokeless tobacco. His alcohol and drug histories are not on file.   Exam: Current vital signs: BP (!) 147/86 (BP Location: Right Arm)   Pulse 78   Temp 97.9 F (36.6 C) (Oral)   Resp 19   Ht 5\' 6"  (1.676 m)   Wt 95.3 kg (210 lb)   SpO2 96%   BMI 33.89 kg/m  Vital signs in last 24 hours: Temp:  [97.9 F (36.6 C)-99.9 F (37.7 C)] 97.9 F (36.6 C) (07/27 2310) Pulse Rate:  [78-102] 78 (07/27 2310) Resp:  [18-23] 19 (07/27 2310) BP: (135-162)/(84-91) 147/86 (07/27 2310) SpO2:  [96 %-100 %] 96 % (07/27 2310) Weight:  [95.3 kg (210 lb)] 95.3 kg (210 lb) (07/27 1905)   Physical Exam  Constitutional: Appears well-developed and well-nourished.  Psych: Affect appropriate to situation Eyes: No scleral injection HENT: No OP obstrucion Head: Normocephalic.  Cardiovascular: Normal rate and regular rhythm.  Respiratory: Effort normal, non-labored breathing GI: Soft.  No distension. There is no tenderness.  Skin: WDI  Neuro: Mental Status: Patient is awake, alert, oriented to person, place, month, gives year is 1997 Patient is able to give a clear and coherent history. No signs of aphasia or neglect World backwards is  "D-L-O-W" Cranial Nerves: II: Visual Fields are full. Pupils are equal, round, and reactive to light.   III,IV, VI: EOMI without ptosis or diploplia.  V: Facial sensation is symmetric to temperature VII: Facial movement with very mild lag on right face with smile VIII: hearing is intact to voice X: Uvula elevates symmetrically XI: Shoulder shrug is symmetric. XII: tongue is midline without atrophy or fasciculations.  Motor: Tone is normal. Bulk is normal. 5/5 strength was present in bilateral upper extremities, though there may be a very mild pronator drift on the right this is not definite.  He has 4+/5 strength of the right leg, 5/5 left leg Sensory: Sensation is symmetric to light touch and temperature in the arms and legs. Cerebellar: FNF and HKS are intact bilaterally  I have reviewed labs in epic and the results pertinent to this consultation are: BMP-unremarkable  I have reviewed the images obtained: CT head-atrophy  Impression: 77 year old male with a history of hypertension, hyperlipidemia, cancer, diabetes. right-sided weakness since Tuesday. I suspect he had a small subcortical infarct  Recommendations: - HgbA1c, fasting lipid panel - MRI, MRA  of the brain without contrast - Frequent neuro checks - Echocardiogram - Carotid dopplers - Prophylactic therapy-Antiplatelet med: Aspirin - dose 325mg  PO or 300mg  PR as well as Plavix 75 mg daily following 200 mg loading - Risk factor modification - Telemetry monitoring - PT consult, OT consult, Speech consult -  Stroke team to follow    Roland Rack, MD [facility] [phone]  If 7pm- 7am, please page neurology on call as listed in [place].

## 2017-09-18 NOTE — Progress Notes (Signed)
PT Cancellation Note  Patient Details Name: Justin [name] MRN: [id number] DOB: 25-Jul-1940   Cancelled Treatment:    Reason Eval/Treat Not Completed: Patient at procedure or test/unavailable. Will follow-up for PT evaluation as schedule permits.  Mabeline Caras, PT, DPT Acute Rehab Services  Pager: [place] 09/18/2017, 8:23 AM

## 2017-09-18 NOTE — Progress Notes (Signed)
VASCULAR LAB PRELIMINARY  PRELIMINARY  PRELIMINARY  PRELIMINARY  Carotid duplex completed.    Preliminary report:  1-39% ICA plaquing.  Vertebral artery flow is antegrade.   Adnan Vanvoorhis, RVT 09/18/2017, 8:45 AM

## 2017-09-18 NOTE — Progress Notes (Signed)
STROKE TEAM PROGRESS NOTE   HISTORY OF PRESENT ILLNESS (per record) Justin [name] is a 77 y.o. male with a history of tramatic subarachnoid hemorrhage in 2015 who presents with right-sided weakness that is been present since Tuesday.   LKW: Tuesday tpa given?: no, outside of window     SUBJECTIVE (INTERVAL HISTORY) Right arm weakness.  No other issues.  MRI - acute left caudate/putamen ischemic infarct.; global atrophy.  MRA - essentially negative, except left P1 stenosis.  CDUS, TTE, A1c pending.  LDL 115, TG 163.  He was taking ASA daily when this happened, but admits to 1-2 days per week of non-compliance.  He has had a previous ICH after falling on ice.      OBJECTIVE Temp:  [97.6 F (36.4 C)-99.9 F (37.7 C)] 97.8 F (36.6 C) (07/28 0452) Pulse Rate:  [74-102] 78 (07/28 1029) Cardiac Rhythm: Normal sinus rhythm (07/28 0800) Resp:  [13-23] 18 (07/28 1045) BP: (118-162)/(77-91) 148/88 (07/28 1045) SpO2:  [91 %-100 %] 98 % (07/28 1029) Weight:  [210 lb (95.3 kg)-228 lb 13.4 oz (103.8 kg)] 228 lb 13.4 oz (103.8 kg) (07/28 0545)  CBC:  Recent Labs  Lab 09/17/17 1920 09/18/17 0310  WBC 10.2 11.4*  HGB 17.8* 16.6  HCT 49.9 49.3  MCV 79.8 82.9  PLT 282 831    Basic Metabolic Panel:  Recent Labs  Lab 09/17/17 1920 09/18/17 0310  NA 139 137  K 3.7 3.6  CL 97* 96*  CO2 28 26  GLUCOSE 167* 178*  BUN 16 15  CREATININE 1.11 1.09  CALCIUM 10.2 10.0    Lipid Panel:     Component Value Date/Time   CHOL 186 09/18/2017 0310   TRIG 163 (H) 09/18/2017 0310   HDL 38 (L) 09/18/2017 0310   CHOLHDL 4.9 09/18/2017 0310   VLDL 33 09/18/2017 0310   LDLCALC 115 (H) 09/18/2017 0310   HgbA1c:  Lab Results  Component Value Date   HGBA1C 7.7 (H) 09/18/2017   Urine Drug Screen: No results found for: LABOPIA, COCAINSCRNUR, LABBENZ, AMPHETMU, THCU, LABBARB  Alcohol Level No results found for: ETH  IMAGING   Ct Head Wo Contrast 09/17/2017 IMPRESSION:  1.  Hypodensity within the left basal ganglia consistent with age indeterminate infarct, new since 05/31/2013 head CT. Negative for hemorrhage or mass.  2. Atrophy with progression of white matter small vessel ischemic disease. 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    Mr Justin [name] Head Wo Contrast 09/18/2017 IMPRESSION:  1. Acute/subacute nonhemorrhagic infarct involving the anterior left basal ganglia measures 2.1 x 3.4 x 2.5 cm. Given the degree of atrophy, there is no significant associated mass effect.  2. Atrophy and diffuse white matter disease are moderately advanced for age. This is consistent with chronic microvascular ischemia.  3. Moderate proximal left M1 segment stenosis may be associated with the area of acute infarct as the origin of perforated arteries.  4. Moderate diffuse medium and small vessel disease as described.  5. 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  6. No emergent large vessel occlusion.      Transthoracic Echocardiogram - pending 00/00/00    Bilateral Carotid Dopplers - pending 00/00/00     PHYSICAL EXAM Vitals:   09/18/17 0545 09/18/17 0652 09/18/17 1029 09/18/17 1045  BP:  134/79 (!) 155/89 (!) 148/88  Pulse:  74 78   Resp:  18 15 18   Temp:      TempSrc:  SpO2:  94% 98%   Weight: 228 lb 13.4 oz (103.8 kg)     Height:         Awake, alert, fully oriented. Language - fluent. C/N/R -intact. Face symmetrical.  Tongue midline.  PERL.  EOMI. Right pronator drift, but otherwise 5/5 BUE and BLE. Sensation - intact. Coord - FTN intact bil.           ASSESSMENT/PLAN [name] is a 77 y.o. male with history of hypertension, hyperlipidemia, diabetes mellitus, prostate cancer, and previous cerebral hemorrhage presenting with right-sided weakness. He did not receive IV t-PA due to late presentation.  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  CT head - Hypodensity within the left basal ganglia consistent with age indeterminate infarct,  MRI head - Acute/subacute nonhemorrhagic infarct involving the anterior left basal ganglia measures 2.1 x 3.4 x 2.5 cm.  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   Carotid Doppler - pending  2D Echo - pending  LDL - 115  HgbA1c - 7.7  VTE prophylaxis -Lovenox Diet Order           Diet heart healthy/carb modified Room service appropriate? Yes; Fluid consistency: Thin  Diet effective now          aspirin 81 mg daily prior to admission, now on aspirin 325 mg daily and clopidogrel 75 mg daily  Patient counseled to be compliant with his antithrombotic medications  Ongoing aggressive stroke risk factor management  Therapy recommendations:  pending  Disposition:  Pending  Hypertension  Stable . Permissive hypertension (OK if < 220/120) but gradually normalize in 5-7 days . Long-term BP goal normotensive  Hyperlipidemia  Lipid lowering medication PTA: Lipitor 40 mg daily  LDL 115, goal < 70  Current lipid lowering medication: Lipitor 40 mg daily  Continue statin at discharge  Diabetes  HgbA1c 7.7, goal < 7.0  Uncontrolled  Other Stroke Risk Factors  Advanced age  Obesity, Body mass index is 36.94 kg/m., recommend weight loss, diet and exercise as appropriate   Hx stroke/TIA (by imaging)   Other Active Problems  Previous traumatic cerebral hemorrhage (may consider changing aspirin to 81 mg daily along with Plavix )      Hospital day # 1  Acute striatal ischemic infarct with minimal residual of right hand weakness.  There might be some cognitive impairment based on my interactions with him.  He is on dual antiplatelet therapy, but with prior ICH  It might be higher risk of ICH even though it was post-traumatic ICH related.  I will switch him to  Plavix monotherapy.  Awaiting further studies.    Rogue Jury, MS, MD  To contact Stroke Continuity provider, please refer to [email or web address] After hours, contact General Neurology

## 2017-09-18 NOTE — Progress Notes (Signed)
  Echocardiogram 2D Echocardiogram has been performed.  Jennette Dubin 09/18/2017, 9:23 AM

## 2017-09-18 NOTE — Evaluation (Addendum)
Physical Therapy Evaluation Patient Details Name: Justin [name] MRN: [id number] DOB: [birth year] Today's Date: 09/18/2017   History of Present Illness  Pt is a 77 y.o. male admitted with c/o RLE weakness. 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 PMH includes HTN, DM2, HLD, traumatic SDH (2015).     Clinical Impression  Pt presents with an overall decrease in functional mobility secondary to above. PTA, pt indep and mows lawns; lives with wife available for 24/7 support. Today, pt ambulating with intermittent min guard for balance. Educ on stroke symptoms, fall risk reduction, and energy conservation. Pt would benefit from continued acute PT services to maximize functional mobility and independence prior to d/c home.     Follow Up Recommendations No PT follow up;Supervision for mobility/OOB    Equipment Recommendations  None recommended by PT    Recommendations for Other Services       Precautions / Restrictions Precautions Precautions: Fall Restrictions Weight Bearing Restrictions: No      Mobility  Bed Mobility Overal bed mobility: Needs Assistance Bed Mobility: Supine to Sit     Supine to sit: Supervision     General bed mobility comments: Heavy reliance on BUE support  Transfers Overall transfer level: Needs assistance Equipment used: Rolling walker (2 wheeled);None Transfers: Sit to/from Stand Sit to Stand: Min guard            Ambulation/Gait Ambulation/Gait assistance: Min guard Gait Distance (Feet): 300 Feet Assistive device: Rolling walker (2 wheeled);None Gait Pattern/deviations: Step-through pattern;Decreased stride length;Trunk flexed Gait velocity: Decreased Gait velocity interpretation: <1.8 ft/sec, indicate of risk for recurrent falls General Gait Details: Initial ambulation with RW due to instability. Able to ambulate further with no DME and min guard for  balance  Stairs            Wheelchair Mobility    Modified Rankin (Stroke Patients Only) Modified Rankin (Stroke Patients Only) Pre-Morbid Rankin Score: No symptoms Modified Rankin: No significant disability     Balance Overall balance assessment: Needs assistance   Sitting balance-Leahy Scale: Fair       Standing balance-Leahy Scale: Fair                               Pertinent Vitals/Pain Pain Assessment: No/denies pain    Home Living Family/patient expects to be discharged to:: Private residence Living Arrangements: Spouse/significant other;Children Available Help at Discharge: Family Type of Home: House Home Access: Stairs to enter Entrance Stairs-Rails: Right Entrance Stairs-Number of Steps: 2 Home Layout: One level Home Equipment: None      Prior Function Level of Independence: Independent         Comments: Works Land        Extremity/Trunk Assessment   Upper Extremity Assessment Upper Extremity Assessment: Overall WFL for tasks assessed    Lower Extremity Assessment Lower Extremity Assessment: RLE deficits/detail RLE Deficits / Details: Grossly 4/5 throughout    Cervical / Trunk Assessment Cervical / Trunk Assessment: Kyphotic  Communication   Communication: HOH  Cognition Arousal/Alertness: Awake/alert Behavior During Therapy: WFL for tasks assessed/performed Overall Cognitive Status: Impaired/Different from baseline Area of Impairment: Attention;Problem solving                   Current Attention Level: Selective         Problem Solving: Slow processing;Requires verbal cues  General Comments General comments (skin integrity, edema, etc.): Wife and son present    Exercises     Assessment/Plan    PT Assessment Patient needs continued PT services  PT Problem List Decreased strength;Decreased activity tolerance;Decreased balance       PT Treatment Interventions  DME instruction;Gait training;Stair training;Functional mobility training;Therapeutic activities;Therapeutic exercise;Balance training;Patient/family education    PT Goals (Current goals can be found in the Care Plan section)  Acute Rehab PT Goals 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 : A Little Difficulty sitting down on and standing up from a chair with arms (e.g., wheelchair, bedside commode, etc,.)?: A Little Help needed moving to and from a bed to chair (including a wheelchair)?: A Little Help needed walking in hospital room?: A Little Help needed climbing 3-5 steps with a railing? : A Little 6 Click Score: 19    End of Session Equipment Utilized During Treatment: Gait belt Activity Tolerance: Patient tolerated treatment well Patient left: in chair;with call bell/phone within reach;with family/visitor present Nurse Communication: Mobility status PT Visit Diagnosis: Other abnormalities of gait and mobility (R26.89)    Time: 1610-9604 PT Time Calculation (min) (ACUTE ONLY): 27 min   Charges:   PT Evaluation $PT Eval Moderate Complexity: 1 Mod PT Treatments $Gait Training: 8-22 mins        Mabeline Caras, PT, DPT Acute Rehab Services  Pager: [place] 09/18/2017, 12:58 PM

## 2017-09-18 NOTE — Progress Notes (Signed)
OT Cancellation Note  Patient Details Name: Justin [name] MRN: [id number] DOB: 1940/12/15   Cancelled Treatment:    Reason Eval/Treat Not Completed: Patient at procedure or test/ unavailable(MRI)  [name] 09/18/2017, 11:47 AM  [name] OTR/L [phone]

## 2017-09-18 NOTE — Evaluation (Signed)
Occupational Therapy Evaluation Patient Details Name: Justin [name] MRN: [id number] DOB: [birth year] Today's Date: 09/18/2017    History of Present Illness Pt is a 77 y.o. male admitted with c/o RLE weakness. 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 PMH includes HTN, DM2, HLD, traumatic SDH (2015).    Clinical Impression   PTA Pt independent in ADL and mobility. Pt is currently min guard for transfers, min guard for standing balance at sink for grooming activities. Able to don socks min guard EOB. Pt will benefit from skilled OT in the acute setting to maximize safety and independence in ADL and functional transfers. Next session to practice tub transfer and finalize DME needs. Wife able to provide 24 hour support, and they also have son who is available to assist.    Follow Up Recommendations  No OT follow up;Supervision/Assistance - 24 hour(initially)    Equipment Recommendations  3 in 1 bedside commode    Recommendations for Other Services       Precautions / Restrictions Precautions Precautions: Fall Restrictions Weight Bearing Restrictions: No      Mobility Bed Mobility Overal bed mobility: Needs Assistance Bed Mobility: Supine to Sit     Supine to sit: Supervision     General bed mobility comments: Pt sitting OOB in recliner at beginning and end of session  Transfers Overall transfer level: Needs assistance Equipment used: Rolling walker (2 wheeled) Transfers: Sit to/from Stand Sit to Stand: Min guard         General transfer comment: vc for safe hand placement with RW    Balance Overall balance assessment: Needs assistance Sitting-balance support: No upper extremity supported;Feet supported Sitting balance-Leahy Scale: Fair Sitting balance - Comments: close min guard during donning socks   Standing balance support: Bilateral upper extremity supported;No upper extremity  supported;During functional activity Standing balance-Leahy Scale: Fair                             ADL either performed or assessed with clinical judgement   ADL Overall ADL's : Needs assistance/impaired Eating/Feeding: Modified independent   Grooming: Min guard;Oral care;Wash/dry face;Standing Grooming Details (indicate cue type and reason): leans heavily against sink to manage standing balance - able to manage all fine motor tasks Upper Body Bathing: Supervision/ safety;Sitting   Lower Body Bathing: Min guard;Sitting/lateral leans   Upper Body Dressing : Set up;Sitting   Lower Body Dressing: Min guard;Sitting/lateral leans Lower Body Dressing Details (indicate cue type and reason): able to don socks with min guard - wife also willing to assist Toilet Transfer: Min guard;Ambulation;RW   Toileting- Water quality scientist and Hygiene: Min guard;Sit to/from stand Toileting - Clothing Manipulation Details (indicate cue type and reason): use of grab bars for safety with transfers     Functional mobility during ADLs: Min guard;Rolling walker       Vision Patient Visual Report: No change from baseline       Perception     Praxis      Pertinent Vitals/Pain Pain Assessment: No/denies pain     Hand Dominance     Extremity/Trunk Assessment Upper Extremity Assessment Upper Extremity Assessment: Overall WFL for tasks assessed   Lower Extremity Assessment Lower Extremity Assessment: Defer to PT evaluation RLE Deficits / Details: Grossly 4/5 throughout   Cervical / Trunk Assessment Cervical / Trunk Assessment: Kyphotic   Communication Communication Communication: HOH   Cognition Arousal/Alertness: Awake/alert  Behavior During Therapy: WFL for tasks assessed/performed Overall Cognitive Status: Impaired/Different from baseline Area of Impairment: Attention;Problem solving                   Current Attention Level: Selective         Problem  Solving: Slow processing;Requires verbal cues     General Comments  wife and son present    Exercises     Shoulder Instructions      Home Living Family/patient expects to be discharged to:: Private residence Living Arrangements: Spouse/significant other;Children Available Help at Discharge: Family Type of Home: House Home Access: Stairs to enter Technical brewer of Steps: 2 Entrance Stairs-Rails: Right Home Layout: One level     Bathroom Shower/Tub: Teacher, early years/pre: Standard     Home Equipment: None          Prior Functioning/Environment Level of Independence: Independent        Comments: Works Lawyer Problem List: Decreased strength;Decreased activity tolerance;Impaired balance (sitting and/or standing);Decreased knowledge of use of DME or AE      OT Treatment/Interventions: Self-care/ADL training;DME and/or AE instruction;Therapeutic activities;Patient/family education;Balance training    OT Goals(Current goals can be found in the care plan section) Acute Rehab OT Goals 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 OT Goal Formulation: With patient/family Time For Goal Achievement: 10/02/17 Potential to Achieve Goals: Good ADL Goals Pt Will Perform Grooming: with modified independence;standing Pt Will Transfer to Toilet: with modified independence;ambulating;regular height toilet Pt Will Perform Toileting - Clothing Manipulation and hygiene: with modified independence;sit to/from stand Pt Will Perform Tub/Shower Transfer: Tub transfer;with supervision;ambulating;3 in 1  OT Frequency: Min 2X/week   Barriers to D/C:            Co-evaluation              AM-PAC PT "6 Clicks" Daily Activity     Outcome Measure Help from another person eating meals?: None Help from another person taking care of personal grooming?: A Little Help from another person toileting, which includes using toliet, bedpan, or urinal?:  A Little Help from another person bathing (including washing, rinsing, drying)?: A Little Help from another person to put on and taking off regular upper body clothing?: None Help from another person to put on and taking off regular lower body clothing?: A Little 6 Click Score: 20   End of Session Equipment Utilized During Treatment: Gait belt;Rolling walker Nurse Communication: Mobility status  Activity Tolerance: Patient tolerated treatment well Patient left: in chair;with call bell/phone within reach;with family/visitor present  OT Visit Diagnosis: Unsteadiness on feet (R26.81);Other abnormalities of gait and mobility (R26.89)                Time: 5993-5701 OT Time Calculation (min): 21 min Charges:  OT General Charges $OT Visit: 1 Visit OT Evaluation $OT Eval Moderate Complexity: 1 Mod [name] OTR/L [place] 09/18/2017, 2:59 PM

## 2017-09-19 DIAGNOSIS — E785 Hyperlipidemia, unspecified: Secondary | ICD-10-CM

## 2017-09-19 DIAGNOSIS — I63512 Cerebral infarction due to unspecified occlusion or stenosis of left middle cerebral artery: Secondary | ICD-10-CM

## 2017-09-19 LAB — CBC WITH DIFFERENTIAL/PLATELET
Abs Immature Granulocytes: 0.1 10*3/uL (ref 0.0–0.1)
Basophils Absolute: 0.1 10*3/uL (ref 0.0–0.1)
Basophils Relative: 1 %
Eosinophils Absolute: 0.3 10*3/uL (ref 0.0–0.7)
Eosinophils Relative: 3 %
HCT: 50.9 % (ref 39.0–52.0)
Hemoglobin: 16.8 g/dL (ref 13.0–17.0)
Immature Granulocytes: 1 %
Lymphocytes Relative: 30 %
Lymphs Abs: 3.1 10*3/uL (ref 0.7–4.0)
MCH: 27.6 pg (ref 26.0–34.0)
MCHC: 33 g/dL (ref 30.0–36.0)
MCV: 83.6 fL (ref 78.0–100.0)
Monocytes Absolute: 1 10*3/uL (ref 0.1–1.0)
Monocytes Relative: 9 %
Neutro Abs: 5.9 10*3/uL (ref 1.7–7.7)
Neutrophils Relative %: 56 %
Platelets: 256 10*3/uL (ref 150–400)
RBC: 6.09 MIL/uL — ABNORMAL HIGH (ref 4.22–5.81)
RDW: 12.6 % (ref 11.5–15.5)
WBC: 10.4 10*3/uL (ref 4.0–10.5)

## 2017-09-19 LAB — BASIC METABOLIC PANEL
Anion gap: 13 (ref 5–15)
BUN: 24 mg/dL — ABNORMAL HIGH (ref 8–23)
CO2: 27 mmol/L (ref 22–32)
Calcium: 9.6 mg/dL (ref 8.9–10.3)
Chloride: 99 mmol/L (ref 98–111)
Creatinine, Ser: 1.07 mg/dL (ref 0.61–1.24)
GFR calc Af Amer: >60 mL/min (ref >60)
GFR calc non Af Amer: >60 mL/min (ref >60)
Glucose, Bld: 173 mg/dL — ABNORMAL HIGH (ref 70–99)
Potassium: 3.6 mmol/L (ref 3.5–5.1)
Sodium: 139 mmol/L (ref 135–145)

## 2017-09-19 LAB — GLUCOSE, CAPILLARY
Glucose-Capillary: 204 mg/dL — ABNORMAL HIGH (ref 70–99)
Glucose-Capillary: 239 mg/dL — ABNORMAL HIGH (ref 70–99)
Glucose-Capillary: 130 mg/dL — ABNORMAL HIGH (ref 70–99)
Glucose-Capillary: 165 mg/dL — ABNORMAL HIGH (ref 70–99)

## 2017-09-19 MED ORDER — INSULIN GLARGINE 100 UNIT/ML ~~LOC~~ SOLN
5.0000 [IU] | Freq: Every day | SUBCUTANEOUS | Status: DC
  Administered 2017-09-19 – 2017-09-20 (×2): 5 [IU] via SUBCUTANEOUS
  Filled 2017-09-19 (×2): qty 0.05

## 2017-09-19 MED ORDER — ASPIRIN EC 325 MG PO TBEC
325.0000 mg | DELAYED_RELEASE_TABLET | Freq: Every day | ORAL | Status: DC
  Administered 2017-09-19 – 2017-09-20 (×2): 325 mg via ORAL
  Filled 2017-09-19 (×2): qty 1

## 2017-09-19 MED ORDER — ATORVASTATIN CALCIUM 80 MG PO TABS: 80.0000 mg | ORAL_TABLET | Freq: Every day | ORAL | 0 refills | Status: DC

## 2017-09-19 MED ORDER — CLOPIDOGREL BISULFATE 75 MG PO TABS: 75.0000 mg | ORAL_TABLET | Freq: Every day | ORAL | 0 refills | Status: DC

## 2017-09-19 MED ORDER — ASPIRIN 325 MG PO TBEC: 325.0000 mg | DELAYED_RELEASE_TABLET | Freq: Every day | ORAL | 0 refills | Status: DC

## 2017-09-19 MED ORDER — ATORVASTATIN CALCIUM 80 MG PO TABS
80.0000 mg | ORAL_TABLET | Freq: Every day | ORAL | Status: DC
  Administered 2017-09-20: 80 mg via ORAL
  Filled 2017-09-19: qty 1

## 2017-09-19 NOTE — Discharge Summary (Addendum)
Discharge Summary  Justin [name] CWC:[id number] DOB: 1940-11-13  PCP: Justin Noon, MD  Admit date: 09/17/2017 Discharge date: 09/20/2017  Time spent: 40 mins  Recommendations for Outpatient Follow-up:  1. PCP 2. Neurology  Discharge Diagnoses:  Active Hospital Problems   Diagnosis Date Noted  . 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 Acute CVA (cerebrovascular accident) ([place]) 09/18/2017  . Diabetes mellitus without complication ([place]) 76/16/0737  . Essential hypertension 07/29/2009  . HLD (hyperlipidemia) 07/29/2009    Resolved Hospital Problems  No resolved problems to display.    Discharge Condition: Stable   Diet recommendation: Heart healthy  Vitals:   09/19/17 0818 09/19/17 1208  BP: (!) 150/84 (!) 150/80  Pulse: 81 80  Resp: 17 (!) 21  Temp: 98.3 F (36.8 C) 98.5 F (36.9 C)  SpO2: 96% 95%    History of present illness:  Justin Vanwagner Mooreis a 76 y.o.malewithhistory of diabetes mellitus type 2, hypertension, hyperlipidemia has been experiencing right sided weakness for the last 6 days PTA. Denies any weakness of other extremities or difficulty swallowing or speaking or any visual symptoms. Patient states his symptoms have actually improved over the last few days. His wife noticed it and advised to come to the ER. Patient has history of previous traumatic subarachnoid hemorrhage after falling on ice in 2015.  In the ER, CT head shows changes concerning for left basal ganglia infarct. On-call neurologist was consulted and patient admitted for further stroke work-up.   Discussed extensively with the wife, pt noted to have some cognitive impairment which has worsened over time, ??likely due to recent CVA as well as ??vascular dementia. Wife also noted pt sleeping frequently during the day (??OSA). Advised sleep study as an outpt. Neurologist also discussed overall medical plan with myself and patients wife. Pt will be discharged with [facility] PT/OT/SLP. 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 Wife verbalized understanding.  Patient has no new complaints. Wife unable to take patient yesterday. Plan to d/c this am.   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  No emergent large vessel occlusion Echo showed EF of 60 to 10%, grade 1 diastolic dysfunction Carotid Doppler showed 1 to 39% ICA plaquing, vertebral artery flow is antegrade LDL 115, A1c 7.7 Neurology on board, rec ASA 325mg , plavix 75 mg daily for 3 months, then plavix alone Sleep study as an outpt to screen pt for OSA HH SLP/PT/OT Follow up with PCP, neurology  Diabetes mellitus type 2 Last A1c 7.7 Continue home glipizide 2.5 mg, metformin 500 mg PCP to follow up  Hypertension Continue hydrochlorothiazide, losartan  Hyperlipidemia Continue statins     Procedures:  None  Consultations:  Neurology  Discharge Exam: BP (!) 150/80 (BP Location: Right Arm)   Pulse 80   Temp 98.5 F (36.9 C) (Oral)   Resp (!) 21   Ht 5\' 6"  (1.676 m)   Wt 103.8 kg (228  lb 13.4 oz)   SpO2 95%   BMI 36.94 kg/m   General: NAD  Cardiovascular: S1, S2 present Respiratory: CTAB  Discharge Instructions You were cared for by a hospitalist during your hospital stay. If you have any questions about your discharge medications or  the care you received while you were in the hospital after you are discharged, you can call the unit and asked to speak with the hospitalist on call if the hospitalist that took care of you is not available. Once you are discharged, your primary care physician will handle any further medical issues. Please note that NO REFILLS for any discharge medications will be authorized once you are discharged, as it is imperative that you return to your primary care physician (or establish a relationship with a primary care physician if you do not have one) for your aftercare needs so that they can reassess your need for medications and monitor your lab values.   Allergies as of 09/19/2017   No Known Allergies     Medication List    TAKE these medications   aspirin 325 MG EC tablet Take 1 tablet (325 mg total) by mouth daily. Start taking on:  09/20/2017 What changed:    medication strength  how much to take   atorvastatin 80 MG tablet Commonly known as:  LIPITOR Take 1 tablet (80 mg total) by mouth daily. What changed:  how much to take   clopidogrel 75 MG tablet Commonly known as:  PLAVIX Take 1 tablet (75 mg total) by mouth daily. Start taking on:  09/20/2017   [employer] VITAMIN B12 1000 MCG tablet Generic drug:  cyanocobalamin Take 12 Units by mouth daily.   fenofibrate 160 MG tablet Take 160 mg by mouth daily.   furosemide 20 MG tablet Commonly known as:  LASIX Take 20 mg by mouth daily.   glipiZIDE 5 MG tablet Commonly known as:  GLUCOTROL Take 5 mg by mouth daily before breakfast.   hydrochlorothiazide 25 MG tablet Commonly known as:  HYDRODIURIL Take 25 mg by mouth daily.   losartan 100 MG tablet Commonly known as:  COZAAR Take 100 mg by mouth daily.   metFORMIN 500 MG 24 hr tablet Commonly known as:  GLUCOPHAGE-XR Take 1,000 mg by mouth 2 (two) times daily.   OZEMPIC 0.25 or 0.5 MG/DOSE Sopn Generic drug:  Semaglutide Inject 0.25 mg into the skin once a week.     polyvinyl alcohol 1.4 % ophthalmic solution Commonly known as:  LIQUIFILM TEARS Place 1 drop into both eyes 4 (four) times daily as needed for dry eyes.   VITAMIN D-1000 MAX ST 1000 units tablet Generic drug:  Cholecalciferol Take 1,000 Units by mouth daily.            Durable Medical Equipment  (From admission, onward)        Start     Ordered   09/19/17 1357  For home use only DME 3 n 1  Once     09/19/17 1356     No Known Allergies Follow-up Information    Justin Noon, MD. Schedule an appointment as soon as possible for a visit in 1 week(s).   Specialty:  Family Medicine Contact information: [place] [address] [phone]        Neurology. Schedule an appointment as soon as possible for a visit in 1 week(s).   Why:  Make appointment with your neurologist in 1 week  The results of significant diagnostics from this hospitalization (including imaging, microbiology, ancillary and laboratory) are listed below for reference.    Significant Diagnostic Studies: Ct Head Wo Contrast  Result Date: 09/17/2017 CLINICAL DATA:  Dragging the left leg EXAM: CT HEAD WITHOUT CONTRAST TECHNIQUE: Contiguous axial images were obtained from the base of the skull through the vertex without intravenous contrast. COMPARISON:  CT brain 05/31/2013 FINDINGS: Brain: No acute hemorrhage or intracranial mass. Moderate small vessel ischemic changes of the white matter, progressed since 2015. Old lacunar infarct in the right basal ganglia. Age indeterminate hypodensity within the left basal ganglia. Moderate-to-marked atrophy. Prominent ventricles felt related to atrophy. Vascular: No hyperdense vessels.  Carotid vascular calcification. Skull: Normal. Negative for fracture or focal lesion. Sinuses/Orbits: No acute finding. Other: None IMPRESSION: 1. Hypodensity within the left basal ganglia consistent with age indeterminate infarct, new since 05/31/2013 head  CT. Negative for hemorrhage or mass. 2. Atrophy with progression of white matter small vessel ischemic disease. 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 Electronically Signed   By: Donavan Foil M.D.   On: 09/17/2017 20:23   Mr Brain Wo Contrast  Result Date: 09/18/2017 CLINICAL DATA:  Focal neuro deficit, greater than 6 hours, stroke suspected. EXAM: MRI HEAD WITHOUT CONTRAST MRA HEAD WITHOUT CONTRAST TECHNIQUE: Multiplanar, multiecho pulse sequences of the brain and surrounding structures were obtained without intravenous contrast. Angiographic images of the head were obtained using MRA technique without contrast. COMPARISON:  CT head without contrast 09/17/2017 at 8:07 p.m. FINDINGS: MRI HEAD FINDINGS Brain: Diffusion-weighted images confirm an acute nonhemorrhagic infarct involving the left caudate head, anterior limb internal capsule, and anterior lentiform nucleus. T2 signal changes are associated with the acute infarct. There is no associated hemorrhage. Advanced atrophy is present. Remote lacunar infarcts are present within the corona radiata bilaterally. Confluent periventricular and subcortical white matter changes are present bilaterally. The ventricles are proportionate to the degree of atrophy. The internal auditory canals are within normal limits bilaterally. The brainstem and cerebellum are normal. Vascular: Flow is present in the major intracranial arteries. Skull and upper cervical spine: The skull base is within normal limits. The craniocervical junction is normal. The upper cervical spine is normal. Marrow signal is unremarkable. Sinuses/Orbits: The paranasal sinuses and mastoid air cells are clear. A left lens replacement is present. Globes and orbits are within normal limits. MRA HEAD FINDINGS Atherosclerotic irregularity is present within the cavernous internal carotid arteries bilaterally without a significant stenosis relative to the ICA terminus. The anterior communicating  artery is patent. The left A1 segment is dominant. There is asymmetric attenuation of the right anterior cerebral artery with the high-grade stenosis in the distal P2 segment. There is an early MCA bifurcation bilaterally, a normal variant. Mild proximal narrowing is present on the left. There is moderate medium and distal small vessel stenosis, left greater than right. No significant proximal stenosis or aneurysm is present. There is no large vessel occlusion. The left vertebral artery is the dominant vessel. A hypoplastic right vertebral artery terminates at the PICA. The basilar artery is normal. Both posterior cerebral arteries originate from the basilar tip. There is a high-grade stenosis of the left P1 segment with contribution from the left posterior communicating artery. Moderate proximal right P1 stenosis is present. There is significant attenuation of distal PCA branch vessels, left greater than right. IMPRESSION: 1. Acute/subacute nonhemorrhagic infarct involving the anterior left basal ganglia measures 2.1 x 3.4 x 2.5 cm. Given the degree of atrophy, there is  no significant associated mass effect. 2. Atrophy and diffuse white matter disease are moderately advanced for age. This is consistent with chronic microvascular ischemia. 3. Moderate proximal left M1 segment stenosis may be associated with the area of acute infarct as the origin of perforated arteries. 4. Moderate diffuse medium and small vessel disease as described. 5. 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 6. No emergent large vessel occlusion. Electronically Signed   By: San Morelle M.D.   On: 09/18/2017 10:57   Mr Virgel [place] OI Contrast  Result Date: 09/18/2017 CLINICAL DATA:  Focal neuro deficit, greater than 6 hours, stroke suspected. EXAM: MRI HEAD WITHOUT CONTRAST MRA HEAD WITHOUT CONTRAST TECHNIQUE: Multiplanar, multiecho pulse sequences of the brain and surrounding  structures were obtained without intravenous contrast. Angiographic images of the head were obtained using MRA technique without contrast. COMPARISON:  CT head without contrast 09/17/2017 at 8:07 p.m. FINDINGS: MRI HEAD FINDINGS Brain: Diffusion-weighted images confirm an acute nonhemorrhagic infarct involving the left caudate head, anterior limb internal capsule, and anterior lentiform nucleus. T2 signal changes are associated with the acute infarct. There is no associated hemorrhage. Advanced atrophy is present. Remote lacunar infarcts are present within the corona radiata bilaterally. Confluent periventricular and subcortical white matter changes are present bilaterally. The ventricles are proportionate to the degree of atrophy. The internal auditory canals are within normal limits bilaterally. The brainstem and cerebellum are normal. Vascular: Flow is present in the major intracranial arteries. Skull and upper cervical spine: The skull base is within normal limits. The craniocervical junction is normal. The upper cervical spine is normal. Marrow signal is unremarkable. Sinuses/Orbits: The paranasal sinuses and mastoid air cells are clear. A left lens replacement is present. Globes and orbits are within normal limits. MRA HEAD FINDINGS Atherosclerotic irregularity is present within the cavernous internal carotid arteries bilaterally without a significant stenosis relative to the ICA terminus. The anterior communicating artery is patent. The left A1 segment is dominant. There is asymmetric attenuation of the right anterior cerebral artery with the high-grade stenosis in the distal P2 segment. There is an early MCA bifurcation bilaterally, a normal variant. Mild proximal narrowing is present on the left. There is moderate medium and distal small vessel stenosis, left greater than right. No significant proximal stenosis or aneurysm is present. There is no large vessel occlusion. The left vertebral artery is the  dominant vessel. A hypoplastic right vertebral artery terminates at the PICA. The basilar artery is normal. Both posterior cerebral arteries originate from the basilar tip. There is a high-grade stenosis of the left P1 segment with contribution from the left posterior communicating artery. Moderate proximal right P1 stenosis is present. There is significant attenuation of distal PCA branch vessels, left greater than right. IMPRESSION: 1. Acute/subacute nonhemorrhagic infarct involving the anterior left basal ganglia measures 2.1 x 3.4 x 2.5 cm. Given the degree of atrophy, there is no significant associated mass effect. 2. Atrophy and diffuse white matter disease are moderately advanced for age. This is consistent with chronic microvascular ischemia. 3. Moderate proximal left M1 segment stenosis may be associated with the area of acute infarct as the origin of perforated arteries. 4. Moderate diffuse medium and small vessel disease as described. 5. 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 6. No emergent large vessel occlusion. Electronically Signed   By: San Morelle M.D.   On: 09/18/2017 10:57    Microbiology: No results found  for this or any previous visit (from the past 240 hour(s)).   Labs: Basic Metabolic Panel: Recent Labs  Lab 09/17/17 1920 09/18/17 0310 09/19/17 0706  NA 139 137 139  K 3.7 3.6 3.6  CL 97* 96* 99  CO2 28 26 27   GLUCOSE 167* 178* 173*  BUN 16 15 24*  CREATININE 1.11 1.09 1.07  CALCIUM 10.2 10.0 9.6   Liver Function Tests: Recent Labs  Lab 09/18/17 0310  AST 26  ALT 29  ALKPHOS 39  BILITOT 1.4*  PROT 7.3  ALBUMIN 4.1   No results for input(s): LIPASE, AMYLASE in the last 168 hours. No results for input(s): AMMONIA in the last 168 hours. CBC: Recent Labs  Lab 09/17/17 1920 09/18/17 0310 09/19/17 0706  WBC 10.2 11.4* 10.4  NEUTROABS  --   --  5.9  HGB 17.8* 16.6 16.8  HCT 49.9 49.3 50.9   MCV 79.8 82.9 83.6  PLT 282 275 256   Cardiac Enzymes: No results for input(s): CKTOTAL, CKMB, CKMBINDEX, TROPONINI in the last 168 hours. BNP: BNP (last 3 results) No results for input(s): BNP in the last 8760 hours.  ProBNP (last 3 results) No results for input(s): PROBNP in the last 8760 hours.  CBG: Recent Labs  Lab 09/18/17 1155 09/18/17 1557 09/18/17 2115 09/19/17 0638 09/19/17 1141  GLUCAP 197* 140* 309* 204* 239*       Signed:  Alma Friendly, MD [facility] 09/19/2017, 4:06 PM

## 2017-09-19 NOTE — Care Management Note (Signed)
Case Management Note  Patient Details  Name: Justin [name] MRN: [id number] Date of Birth: 07-23-1940  Subjective/Objective:     Pt admitted with CVA. He is from home with spouse.                Action/Plan: No f/u per PT/OT.  Pts wife is able to provide supervision at home.  Pt with orders for 3 in 1. James with [facility] DME notified and will deliver to the room. Wife to provide transport home.   Expected Discharge Date:                  Expected Discharge Plan:  Home/Self Care  In-House Referral:     Discharge planning Services  CM Consult, Medication Assistance  Post Acute Care Choice:    Choice offered to:  Patient, Spouse  DME Arranged:  3-N-1 DME Agency:  [place]:    [place]:     Status of Service:  Completed, signed off  If discussed at [place] of Stay Meetings, dates discussed:    Additional Comments:  Pollie Friar, RN 09/19/2017, 3:00 PM

## 2017-09-19 NOTE — Progress Notes (Addendum)
Physical Therapy Treatment Patient Details Name: Justin [name] MRN: [id number] DOB: [birth year] Today's Date: 09/19/2017    History of Present Illness Pt is a 77 y.o. male admitted with c/o RLE weakness. 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 PMH includes HTN, DM2, HLD, traumatic SDH (2015).     PT Comments    Patient is making progress toward PT goals. Pt presents with impaired balance and cognition and will benefit from HHPT for further skilled PT services to maximize independence and safety with mobility.   Follow Up Recommendations  HHPT;24 hour supervision/assistance      Equipment Recommendations  None recommended by PT    Recommendations for Other Services       Precautions / Restrictions Precautions Precautions: Fall Restrictions Weight Bearing Restrictions: No    Mobility  Bed Mobility Overal bed mobility: Modified Independent Bed Mobility: Supine to Sit           General bed mobility comments: increased time and effort  Transfers Overall transfer level: Needs assistance Equipment used: None Transfers: Sit to/from Stand Sit to Stand: Supervision         General transfer comment: supervision for safety  Ambulation/Gait Ambulation/Gait assistance: Min guard Gait Distance (Feet): 300 Feet Assistive device: None Gait Pattern/deviations: Step-through pattern;Decreased stride length;Wide base of support(lateral sway) Gait velocity: Decreased   General Gait Details: min guard for safety but no physical assistance required; pt with unsteady gait but no overt LOB; cues for increased bilat step lengths   Stairs Stairs: Yes Stairs assistance: Min guard Stair Management: One rail Right;Alternating pattern;Step to pattern;Forwards Number of Stairs: 2 General stair comments: cues for sequencing and safety   Wheelchair Mobility    Modified Rankin (Stroke Patients  Only) Modified Rankin (Stroke Patients Only) Pre-Morbid Rankin Score: No symptoms Modified Rankin: Slight disability     Balance Overall balance assessment: Needs assistance Sitting-balance support: No upper extremity supported;Feet supported Sitting balance-Leahy Scale: Fair     Standing balance support: No upper extremity supported;During functional activity Standing balance-Leahy Scale: Fair                              Cognition Arousal/Alertness: Awake/alert Behavior During Therapy: WFL for tasks assessed/performed Overall Cognitive Status: No family/caregiver present to determine baseline cognitive functioning Area of Impairment: Problem solving;Attention                   Current Attention Level: Selective         Problem Solving: Slow processing;Requires verbal cues General Comments: pt easily distracted by environment      Exercises      General Comments        Pertinent Vitals/Pain Pain Assessment: No/denies pain    Home Living                      Prior Function            PT Goals (current goals can now be found in the care plan section) Acute Rehab PT Goals PT Goal Formulation: With patient Time For Goal Achievement: 10/02/17 Potential to Achieve Goals: Good Progress towards PT goals: Progressing toward goals    Frequency    Min 4X/week      PT Plan Current plan remains appropriate    Co-evaluation              AM-PAC  PT "6 Clicks" Daily Activity  Outcome Measure  Difficulty turning over in bed (including adjusting bedclothes, sheets and blankets)?: None Difficulty moving from lying on back to sitting on the side of the bed? : A Little Difficulty sitting down on and standing up from a chair with arms (e.g., wheelchair, bedside commode, etc,.)?: A Little Help needed moving to and from a bed to chair (including a wheelchair)?: A Little Help needed walking in hospital room?: A Little Help needed  climbing 3-5 steps with a railing? : A Little 6 Click Score: 19    End of Session Equipment Utilized During Treatment: Gait belt Activity Tolerance: Patient tolerated treatment well Patient left: in chair;with call bell/phone within reach Nurse Communication: Mobility status PT Visit Diagnosis: Other abnormalities of gait and mobility (R26.89)     Time: 4975-3005 PT Time Calculation (min) (ACUTE ONLY): 18 min  Charges:  $Gait Training: 8-22 mins                     Earney Navy, PTA Pager: [phone]     Darliss Cheney 09/19/2017, 9:14 AM

## 2017-09-19 NOTE — Progress Notes (Signed)
Occupational Therapy Treatment Patient Details Name: Justin [name] MRN: [id number] DOB: 04-11-1940 Today's Date: 09/19/2017    History of present illness Pt is a 77 y.o. male admitted with c/o RLE weakness. 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 PMH includes HTN, DM2, HLD, traumatic SDH (2015).    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 PLEASE ADDRESS DRIVING! Pt demonstrates cognitive deficits that make patient unsafe to drive at this time from OT standpoint. Pt demonstrates balance deficits with adls.    Follow Up Recommendations  Home health OT    Equipment Recommendations  3 in 1 bedside commode    Recommendations for Other Services      Precautions / Restrictions Precautions Precautions: Fall       Mobility Bed Mobility Overal bed mobility: Needs Assistance Bed Mobility: Supine to Sit     Supine to sit: Mod assist     General bed mobility comments: bed flat and requires wife to pull him up into sitting. when asked by OT 'what happen there" he states "she lost her balance thats why she got closer" and wife did get closer but due to patient pulling on her  Transfers Overall transfer level: Needs assistance   Transfers: Sit to/from Stand Sit to Stand: Min guard         General transfer comment: pt furniture walking in session    Balance Overall balance assessment: Needs assistance           Standing balance-Leahy Scale: Fair                             ADL either performed or assessed with clinical judgement   ADL Overall ADL's : Needs assistance/impaired                                 Tub/ Shower Transfer: Walk-in shower;Minimal assistance;Grab bars Clinical cytogeneticist Details (indicate cue type and reason): pt requires use of grab bars and needs steady (A) to step over Functional mobility during  ADLs: Min guard General ADL Comments: pt furniture walking to the bathroom with heavy dependence on environment. Pt making comments that the wife appears embarrassed by. Pts wife in near tears during session stating "i dont know what to do when he is mean to me. he is bigger than me" wife reports that all "mean to me" has been verbal only . OT offers to allow him to be verbal as long as he is safe then it is just words     Vision       Perception     Praxis      Cognition Arousal/Alertness: Awake/alert Behavior During Therapy: WFL for tasks assessed/performed Overall Cognitive Status: Impaired/Different from baseline Area of Impairment: Problem solving;Attention;Memory;Safety/judgement;Awareness                   Current Attention Level: Selective Memory: Decreased recall of precautions;Decreased short-term memory   Safety/Judgement: Decreased awareness of safety;Decreased awareness of deficits Awareness: Intellectual Problem Solving: Slow processing;Requires verbal cues;Requires tactile cues;Decreased initiation;Difficulty sequencing General Comments: pt unable to verbalize what meal was delivered ( breakfast dinner vs supper) pt requires additional cues from wife. Pt unable to verbalize how to give therapist 13 dollars . pt states "8 ones" then states "  10 and 5 there is fifteen" pt seems shocked to learn that he can not drive        Exercises     Shoulder Instructions       General Comments wife expressed concerns. 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 OT Goal Formulation: With patient/family Time For Goal Achievement: 10/02/17 Potential to Achieve Goals: Good ADL Goals Pt Will Perform Grooming: with modified independence;standing Pt Will Transfer to Toilet: with modified independence;ambulating;regular height toilet Pt Will Perform Toileting - Clothing Manipulation and hygiene: with modified independence;sit to/from stand Pt Will Perform Tub/Shower Transfer: with supervision;ambulating;3 in 1;Shower transfer  Plan Discharge plan needs to be updated    Co-evaluation                 AM-PAC PT "6 Clicks" Daily Activity     Outcome Measure   Help from another person eating meals?: None Help from another person taking care of personal grooming?: A Little Help from another person toileting, which includes using toliet, bedpan, or urinal?: A Little Help from another person bathing (including washing, rinsing, drying)?: A Little Help from another person to put on and taking off regular upper body clothing?: None Help from another person to put on and taking off regular lower body clothing?: A Little 6 Click Score: 20    End of Session Equipment Utilized During Treatment: Gait belt;Rolling walker  OT Visit Diagnosis: Unsteadiness on feet (R26.81);Other abnormalities of gait and mobility (R26.89)   Activity Tolerance Patient tolerated treatment well   Patient Left with call bell/phone within reach;with family/visitor present;in bed;with bed alarm set   Nurse Communication Mobility status;Precautions        Time: 9528-4132 OT Time Calculation (min): 19 min  Charges: OT General Charges $OT Visit: 1 Visit OT Treatments $Self Care/Home Management : 8-22 mins   Jeri Modena   OTR/L Pager: [phone] Office: [phone] .    Parke Poisson B 09/19/2017, 3:14 PM

## 2017-09-19 NOTE — Care Management Note (Signed)
Case Management Note  Patient Details  Name: Justin [name] MRN: [id number] Date of Birth: [birth year]  Subjective/Objective:                    Action/Plan: Pt with orders for [facility] services. CM met with the patient and his spouse and provide choice. They selected [facility]. Cory with [facility] notified and accepted the referral. Wife to provide transportation home.   Expected Discharge Date:  09/19/17               Expected Discharge Plan:  Home/Self Care  In-House Referral:     Discharge planning Services  CM Consult, Medication Assistance  Post Acute Care Choice:    Choice offered to:  Patient, Spouse  DME Arranged:  3-N-1 DME Agency:  [place]:  PT, OT, Speech Therapy HH Agency:  [facility]  Status of Service:  Completed, signed off  If discussed at [place] of Stay Meetings, dates discussed:    Additional Comments:  Pollie Friar, RN 09/19/2017, 4:22 PM

## 2017-09-19 NOTE — Progress Notes (Signed)
RN went to D/C pt. Wife reports that she is unable to take husband home because she has a doctors appointment in the morning; and pt cant be left alone. RN page notified MD Horris Latino).

## 2017-09-19 NOTE — Progress Notes (Signed)
Inpatient Diabetes Program Recommendations  AACE/ADA: New Consensus Statement on Inpatient Glycemic Control (2019)  Target Ranges:  Prepandial:   less than 140 mg/dL      Peak postprandial:   less than 180 mg/dL (1-2 hours)      Critically ill patients:  140 - 180 mg/dL   Results for JAG, LENZ (MRN [id number]) as of 09/19/2017 10:56  Ref. Range 09/18/2017 06:33 09/18/2017 11:55 09/18/2017 15:57 09/18/2017 21:15 09/19/2017 06:38  Glucose-Capillary Latest Ref Range: 70 - 99 mg/dL 179 (H) 197 (H) 140 (H) 309 (H) 204 (H)   Results for JERRIE, GULLO (MRN [id number]) as of 09/19/2017 10:56  Ref. Range 09/18/2017 03:10  Hemoglobin A1C Latest Ref Range: 4.8 - 5.6 % 7.7 (H)   Review of Glycemic Control  Diabetes history: DM2 Outpatient Diabetes medications: Glipizide 5 mg QAM, Metformin 1000 mg BID, Ozempic 0.25 mg Qweek Current orders for Inpatient glycemic control: Novolog 0-9 units TID with meals  Inpatient Diabetes Program Recommendations: Insulin - Basal: Please consider ordering Lantus 5 units daily. Correction (SSI): Please consider adding Novolog 0-5 units QHS for bedtime correction scale.  Thanks, Barnie Alderman, RN, MSN, CDE Diabetes Coordinator Inpatient Diabetes Program [phone] (Team Pager from 8am to 5pm)

## 2017-09-19 NOTE — Progress Notes (Signed)
STROKE TEAM PROGRESS NOTE   HISTORY OF PRESENT ILLNESS (per record) Justin [name] is a 77 y.o. male with a history of tramatic subarachnoid hemorrhage in 2015 who presents with right-sided weakness that is been present since Tuesday.   LKW: Tuesday tpa given?: no, outside of window     SUBJECTIVE (INTERVAL HISTORY) Wife at bedside.  Patient lying in bed, mildly sleepy.  Right-sided weakness much improved.  PT/OT recommend home health.  OBJECTIVE Temp:  [98.2 F (36.8 C)-98.5 F (36.9 C)] 98.3 F (36.8 C) (07/29 0818) Pulse Rate:  [78-93] 81 (07/29 0818) Cardiac Rhythm: Normal sinus rhythm;Bundle branch block (07/29 0700) Resp:  [15-23] 17 (07/29 0818) BP: (130-155)/(73-118) 150/84 (07/29 0818) SpO2:  [91 %-98 %] 96 % (07/29 0818)  CBC:  Recent Labs  Lab 09/18/17 0310 09/19/17 0706  WBC 11.4* 10.4  NEUTROABS  --  5.9  HGB 16.6 16.8  HCT 49.3 50.9  MCV 82.9 83.6  PLT 275 734    Basic Metabolic Panel:  Recent Labs  Lab 09/18/17 0310 09/19/17 0706  NA 137 139  K 3.6 3.6  CL 96* 99  CO2 26 27  GLUCOSE 178* 173*  BUN 15 24*  CREATININE 1.09 1.07  CALCIUM 10.0 9.6    Lipid Panel:     Component Value Date/Time   CHOL 186 09/18/2017 0310   TRIG 163 (H) 09/18/2017 0310   HDL 38 (L) 09/18/2017 0310   CHOLHDL 4.9 09/18/2017 0310   VLDL 33 09/18/2017 0310   LDLCALC 115 (H) 09/18/2017 0310   HgbA1c:  Lab Results  Component Value Date   HGBA1C 7.7 (H) 09/18/2017   Urine Drug Screen: No results found for: LABOPIA, COCAINSCRNUR, LABBENZ, AMPHETMU, THCU, LABBARB  Alcohol Level No results found for: ETH  IMAGING   Ct Head Wo Contrast 09/17/2017 IMPRESSION:  1. Hypodensity within the left basal ganglia consistent with age indeterminate infarct, new since 05/31/2013 head CT. Negative for hemorrhage or mass.  2. Atrophy with progression of white matter small vessel ischemic disease. 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    Mr Justin [name] Head Wo  Contrast 09/18/2017 IMPRESSION:  1. Acute/subacute nonhemorrhagic infarct involving the anterior left basal ganglia measures 2.1 x 3.4 x 2.5 cm. Given the degree of atrophy, there is no significant associated mass effect.  2. Atrophy and diffuse white matter disease are moderately advanced for age. This is consistent with chronic microvascular ischemia.  3. Moderate proximal left M1 segment stenosis may be associated with the area of acute infarct as the origin of perforated arteries.  4. Moderate diffuse medium and small vessel disease as described.  5. 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  6. No emergent large vessel occlusion.    Transthoracic Echocardiogram  09/18/2017 Study Conclusions - Left ventricle: The cavity size was normal. Wall thickness was   increased in a pattern of mild LVH. Systolic function was normal.   The estimated ejection fraction was in the range of 60% to 65%.   Wall motion was normal; there were no regional wall motion   abnormalities. Doppler parameters are consistent with abnormal   left ventricular relaxation (grade 1 diastolic dysfunction). The   E/e&' ratio is <8, suggesting normal LV filling pressure. - Aortic valve: Sclerosis without stenosis. There was trivial   regurgitation. - Mitral valve: Calcified annulus. Mildly thickened leaflets .   There was trivial regurgitation. - Left atrium: The atrium was normal in size. -  Inferior vena cava: The vessel was normal in size. The   respirophasic diameter changes were in the normal range (>= 50%),   consistent with normal central venous pressure. Impressions: - LVEF 60-65%, mild LVH, normal wall motion, grade 1 DD, normal LV   filling pressure, trivial AI, MR, normal LA size, normal IVC.   Bilateral Carotid Dopplers  09/18/2017 Preliminary report:  1-39% ICA plaquing.  Vertebral artery flow is antegrade.        PHYSICAL EXAM Vitals:    09/18/17 2053 09/19/17 0054 09/19/17 0454 09/19/17 0818  BP: (!) 142/84 (!) 136/92 (!) 131/118 (!) 150/84  Pulse: 89 93 80 81  Resp: (!) 23 16 16 17   Temp: 98.4 F (36.9 C) 98.2 F (36.8 C)  98.3 F (36.8 C)  TempSrc: Oral Oral  Oral  SpO2: 91% 92% 92% 96%  Weight:      Height:         Awake, alert, oriented to place and people, not orientated to time. Language - fluent. C/N/R -intact. Face symmetrical.  Tongue midline.  PERL.  EOMI. Right pronator drift resolved, otherwise 5/5 BUE and BLE. Sensation - intact. Coord - FTN intact bil.   ASSESSMENT/PLAN [name] is a 77 y.o. male with history of hypertension, hyperlipidemia, diabetes mellitus, prostate cancer, and previous cerebral hemorrhage presenting with right-sided weakness. He did not receive IV t-PA due to late presentation.  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  CT head - Hypodensity within the left basal ganglia consistent with age indeterminate infarct,  MRI head - Acute/subacute nonhemorrhagic infarct involving the anterior left basal ganglia measures 2.1 x 3.4 x 2.5 cm.  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   Carotid Doppler - Preliminary report:  1-39% ICA plaquing.  Vertebral artery flow is antegrade.   2D Echo - LVEF 60-65%, No cardiac source of emboli identified.  LDL - 115  HgbA1c - 7.7  VTE prophylaxis -Lovenox Diet Order           Diet heart healthy/carb modified Room service appropriate? Yes; Fluid consistency: Thin  Diet effective now          aspirin 81 mg daily prior to admission, now on aspirin 325 mg daily and clopidogrel 75 mg daily  Patient counseled to be compliant with his antithrombotic medications  Ongoing aggressive stroke risk factor management  Therapy recommendations:  HH PT ; No OT f/u  Disposition:   Pending  Hypertension  Stable . Permissive hypertension (OK if < 220/120) but gradually normalize in 5-7 days . Long-term BP goal normotensive  Hyperlipidemia  Lipid lowering medication PTA: Lipitor 40 mg daily  LDL 115, goal < 70  Current lipid lowering medication: Lipitor 40 mg daily  Continue statin at discharge  Diabetes  HgbA1c 7.7, goal < 7.0  Uncontrolled  Other Stroke Risk Factors  Advanced age  Obesity, Body mass index is 36.94 kg/m., recommend weight loss, diet and exercise as appropriate   Hx stroke/TIA (by imaging)   Other Active Problems  Previous traumatic cerebral hemorrhage (may consider changing aspirin to 81 mg daily along with Plavix )    Hospital day # 2  ATTENDING NOTE: I reviewed above note and agree with the assessment and plan. I have made any additions or clarifications directly to the above note. Pt was seen and examined.   77 year old male with history of  diabetes, hypertension, hyperlipidemia, prostate cancer, obesity, traumatic ICH in 2015 admitted for right-sided weakness.  CT and MRI showed left caudate and BG infarct, as well as old right BG/CR infarct.  MRI showed right P1/P2 stenosis, bilateral siphon and left M1 moderate stenosis.  EF 60 to 65% carotid Doppler negative.  LDL 115, A1c 7.7.  Patient stroke most likely due to large and small vessel atherosclerosis including left M1 moderate stenosis.  Recommend aspirin 325 and Plavix 75 DAPT for 3 months and then Plavix alone.  Increase Lipitor from 40-80 due to LDL not under control.  A1c still high, emphasized on stroke risk factor modification.  PT/OT recommend home health PT/OT.  Further recommendation from stroke service.  Neurology will sign off. Please call with questions. Pt will follow up with stroke clinic NP at [facility] in about 4 weeks. Thanks for the consult.   Rosalin Hawking, MD PhD Stroke Neurology 09/19/2017 9:51 PM       To contact Stroke Continuity provider, please  refer to [email or web address] After hours, contact General Neurology

## 2017-09-19 NOTE — Plan of Care (Signed)
  Problem: Activity: Goal: Risk for activity intolerance will decrease Outcome: Progressing   

## 2017-09-19 NOTE — Evaluation (Signed)
Speech Language Pathology Evaluation Patient Details Name: Justin [name] MRN: [id number] DOB: November 15, 1940 Today's Date: 09/19/2017 Time: 8786-7672 SLP Time Calculation (min) (ACUTE ONLY): 17 min  Problem List:  Patient Active Problem List   Diagnosis Date Noted  . Acute CVA (cerebrovascular accident) ([place]) 09/18/2017  . Stroke (cerebrum) ([place]) 09/17/2017  . Diabetes mellitus without complication ([place]) 09/47/0962  . Closed head injury 04/22/2013  . HLD (hyperlipidemia) 07/29/2009  . Essential hypertension 07/29/2009  . EFFUSION, PLEURAL 07/29/2009  . DIVERTICULITIS, COLON 07/29/2009  . 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 COLONIC POLYPS, HYPERPLASTIC, HX OF 07/29/2009   Past Medical History:  Past Medical History:  Diagnosis Date  . Brain bleed ([place])   . Cancer [facility])    prostate  . Diabetes mellitus without complication ([place])   . High cholesterol   . Hypertension    Past Surgical History:  Past Surgical History:  Procedure Laterality Date  . COLON SURGERY     partial  . EYE SURGERY     lens implant and cateract- Left  . PROSTATECTOMY     HPI:  Justin Esty Mooreis a 77 y.o.malewithhistory of diabetes mellitus type 2, hypertension, hyperlipidemia has been experiencing rightsidedweakness for the last 6 daysPTA.Denies any weakness of other extremities or difficulty swallowing or speaking or any visual symptoms. Patient states his symptoms have actually improved over the last few days. His wife noticed it and advised to come to the ER. Patient has history of previous traumatic subarachnoid hemorrhageafter falling on icein 2015. CT head shows changes concerning for left basal ganglia infarct with MRI revealing Acute/subacute nonhemorrhagic infarct involving the anterior left basal ganglia measures 2.1 x 3.4 x 2.5 cm. Given the degree of atrophy, there is no significant associated mass effect as well as atrophy and diffuse white matter disease are moderately advanced for age.  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 Pt with several off-topic inappropriate comments that were insulting of SLP. Wife was very embarressed and shocked by behavior. Despite Max A information/education pt is not able to demonstrate understanding of information/deficits identified during evaluation. Wife present during evaluation and states that his baseline was functional. Wife is fearful of taking him home with the above mentioned deficits. ST recommend HHST to target the deficits and 24 hour supervision within home. Wife voiced understanding but remains concerned about pt safety in light of these cognitive deficits. Petra Kuba of cognitive deficits provided to MD as well.     SLP Assessment  SLP Recommendation/Assessment: Patient needs continued Speech Lanaguage Pathology Services SLP Visit Diagnosis: Cognitive communication deficit (R41.841)    Follow Up Recommendations  Home health SLP    Frequency and Duration min 2x/week  2 weeks      SLP Evaluation Cognition  Overall Cognitive Status: Impaired/Different from baseline Arousal/Alertness: Awake/alert Orientation Level: Oriented to person;Oriented to place;Disoriented to time;Disoriented to situation Attention: Selective;Sustained Sustained Attention: Impaired Sustained Attention Impairment: Verbal basic;Functional basic Selective Attention: Impaired Selective Attention Impairment:  Verbal basic;Functional basic Memory: Impaired Memory Impairment: Storage deficit;Retrieval deficit;Decreased recall of new information;Decreased short  term memory Decreased Short Term Memory: Verbal basic;Functional basic Awareness: Impaired Awareness Impairment: Intellectual impairment Problem Solving: Impaired Problem Solving Impairment: Verbal basic;Functional basic Executive Function: (all impaired by lower level deficits) Safety/Judgment: Impaired Comments: no awareness of deficits, 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       Comprehension  Auditory Comprehension Overall Auditory Comprehension: Impaired(d/t cognitive deficits, increased processing times) Visual Recognition/Discrimination Discrimination: Not tested Reading Comprehension Reading Status: Not tested    Expression Expression Primary Mode of Expression: Verbal Verbal Expression Overall Verbal Expression: Appears within functional limits for tasks assessed   Oral / Motor  Oral Motor/Sensory Function Overall Oral Motor/Sensory Function: Within functional limits Motor Speech Overall Motor Speech: Appears within functional limits for tasks assessed   GO                    [name] 09/19/2017, 11:48 AM

## 2017-09-20 LAB — BASIC METABOLIC PANEL
Anion gap: 9 (ref 5–15)
BUN: 23 mg/dL (ref 8–23)
CO2: 29 mmol/L (ref 22–32)
Calcium: 9.4 mg/dL (ref 8.9–10.3)
Chloride: 101 mmol/L (ref 98–111)
Creatinine, Ser: 0.96 mg/dL (ref 0.61–1.24)
GFR calc Af Amer: >60 mL/min (ref >60)
GFR calc non Af Amer: >60 mL/min (ref >60)
Glucose, Bld: 147 mg/dL — ABNORMAL HIGH (ref 70–99)
Potassium: 3.4 mmol/L — ABNORMAL LOW (ref 3.5–5.1)
Sodium: 139 mmol/L (ref 135–145)

## 2017-09-20 LAB — GLUCOSE, CAPILLARY: Glucose-Capillary: 158 mg/dL — ABNORMAL HIGH (ref 70–99)

## 2017-09-20 NOTE — Progress Notes (Signed)
Occupational Therapy Treatment Patient Details Name: Justin [name] MRN: [id number] DOB: May 24, 1940 Today's Date: 09/20/2017    History of present illness Pt is a 77 y.o. male admitted with c/o RLE weakness. 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 PMH includes HTN, DM2, HLD, traumatic SDH (2015).    OT comments  Pt demonstrates severe memory deficits with "blessed test" . Pt reports the years as "22 something of 81" . Pt and wife educated on the need for adl task and medications. Pt does report "no" when asked if he should drive. Pt educated that he should not drive at this time per MD recommendations. Pt is high risk for injury to himself or others if driving based on cognition testing. Wife states "now there has to be two of me" OT asking "what do you mean?" wife reports "I already do so much and I have to do it all."    Follow Up Recommendations  Home health OT    Equipment Recommendations  3 in 1 bedside commode    Recommendations for Other Services      Precautions / Restrictions Precautions Precautions: Fall Restrictions Weight Bearing Restrictions: No       Mobility Bed Mobility               General bed mobility comments: pt OOB in chair upon arrival   Transfers Overall transfer level: Needs assistance   Transfers: Sit to/from Stand Sit to Stand: Min guard         General transfer comment: pt furniture walking in session    Balance Overall balance assessment: Needs assistance           Standing balance-Leahy Scale: Fair Standing balance comment: requires environmental supports. recommend use of RW                           ADL either performed or assessed with clinical judgement   ADL Overall ADL's : Needs assistance/impaired                 Upper Body Dressing : Set up   Lower Body Dressing: Set up Lower Body Dressing Details (indicate cue type and  reason): pt don shoes and socks. pt was unable to button pants and wife unable to (A)  due to poor fit.              Functional mobility during ADLs: Minimal assistance General ADL Comments: pt noted to have a R lean and favor R LE during transfer with PTA Kellyn back to room . pt with a limp to L LE      Vision       Perception     Praxis      Cognition Arousal/Alertness: Awake/alert Behavior During Therapy: WFL for tasks assessed/performed Overall Cognitive Status: Impaired/Different from baseline Area of Impairment: Problem solving;Awareness;Safety/judgement                   Current Attention Level: Selective Memory: Decreased recall of precautions;Decreased short-term memory   Safety/Judgement: Decreased awareness of safety;Decreased awareness of deficits Awareness: Intellectual Problem Solving: Slow processing;Decreased initiation General Comments: pt provided the "Blessed test with a score of 26 out 28 showing severe impairments in memory" Pt was unable to recall information provided to him in 1 second space and recall it back to therapist. Pt reports the year as 2079.  Exercises     Shoulder Instructions       General Comments      Pertinent Vitals/ Pain       Pain Assessment: No/denies pain  Home Living                                          Prior Functioning/Environment              Frequency  Min 2X/week        Progress Toward Goals  OT Goals(current goals can now be found in the care plan section)  Progress towards OT goals: Progressing toward goals  Acute Rehab OT Goals 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 OT Goal Formulation: With patient/family Time For Goal Achievement: 10/02/17 Potential to Achieve Goals: Good ADL Goals Pt Will Perform Grooming: with modified independence;standing Pt Will Transfer to Toilet: with modified independence;ambulating;regular height toilet Pt Will Perform  Toileting - Clothing Manipulation and hygiene: with modified independence;sit to/from stand Pt Will Perform Tub/Shower Transfer: with supervision;ambulating;3 in 1;Shower transfer  Plan Discharge plan remains appropriate    Co-evaluation                 AM-PAC PT "6 Clicks" Daily Activity     Outcome Measure   Help from another person eating meals?: None Help from another person taking care of personal grooming?: A Little Help from another person toileting, which includes using toliet, bedpan, or urinal?: A Little Help from another person bathing (including washing, rinsing, drying)?: A Little Help from another person to put on and taking off regular upper body clothing?: None Help from another person to put on and taking off regular lower body clothing?: A Little 6 Click Score: 20    End of Session Equipment Utilized During Treatment: Gait belt  OT Visit Diagnosis: Unsteadiness on feet (R26.81);Other abnormalities of gait and mobility (R26.89)   Activity Tolerance Patient tolerated treatment well   Patient Left in chair;with call bell/phone within reach;with family/visitor present   Nurse Communication Mobility status;Precautions        Time: 7824-2353 OT Time Calculation (min): 19 min  Charges: OT General Charges $OT Visit: 1 Visit OT Treatments $Cognitive Skills Development: 8-22 mins   Jeri Modena   OTR/L Pager: [phone] Office: [phone] .    Parke Poisson B 09/20/2017, 11:14 AM

## 2017-09-20 NOTE — Progress Notes (Signed)
Physical Therapy Treatment Patient Details Name: Justin [name] MRN: [id number] DOB: 12/10/1940 Today's Date: 09/20/2017    History of Present Illness Pt is a 77 y.o. male admitted with c/o RLE weakness. 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 PMH includes HTN, DM2, HLD, traumatic SDH (2015).     PT Comments    Patient continues to demonstrate impaired balance and cognition. Pt will benefit from further skilled PT services to maximize independence and safety with mobility.    Follow Up Recommendations  Home health PT;Supervision/Assistance - 24 hour     Equipment Recommendations  Rolling walker with 5" wheels    Recommendations for Other Services       Precautions / Restrictions Precautions Precautions: Fall    Mobility  Bed Mobility               General bed mobility comments: pt OOB in chair upon arrival   Transfers Overall transfer level: Needs assistance   Transfers: Sit to/from Stand Sit to Stand: Min guard         General transfer comment: min guard for safety  Ambulation/Gait Ambulation/Gait assistance: Min guard Gait Distance (Feet): 200 Feet Assistive device: None Gait Pattern/deviations: Step-through pattern;Decreased step length - right;Decreased stance time - left;Decreased stride length Gait velocity: Decreased   General Gait Details: cues for increased stride length and cadence; pt easily distracted by environment and needs redirection to task   Stairs Stairs: Yes Stairs assistance: Min guard Stair Management: One rail Right;Alternating pattern;Forwards Number of Stairs: 2 General stair comments: cues for safety   Wheelchair Mobility    Modified Rankin (Stroke Patients Only)       Balance Overall balance assessment: Needs assistance Sitting-balance support: No upper extremity supported;Feet supported Sitting balance-Leahy Scale: Fair       Standing  balance-Leahy Scale: Fair Standing balance comment: requires environmental supports. recommend use of RW                            Cognition Arousal/Alertness: Awake/alert Behavior During Therapy: WFL for tasks assessed/performed Overall Cognitive Status: Impaired/Different from baseline Area of Impairment: Problem solving;Awareness;Safety/judgement                   Current Attention Level: Selective Memory: Decreased recall of precautions;Decreased short-term memory   Safety/Judgement: Decreased awareness of safety;Decreased awareness of deficits Awareness: Intellectual Problem Solving: Slow processing;Decreased initiation General Comments: pt provided the "Blessed test with a score of 26 out 28 showing severe impairments in memory" Pt was unable to recall information provided to him in 1 second space and recall it back to therapist. Pt reports the year as 2079.       Exercises      General Comments General comments (skin integrity, edema, etc.): wife present      Pertinent Vitals/Pain Pain Assessment: No/denies pain    Home Living                      Prior Function            PT Goals (current goals can now be found in the care plan section) Acute Rehab PT Goals 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 Progress towards PT goals: Progressing toward goals    Frequency    Min 4X/week      PT Plan Current plan remains appropriate    Co-evaluation  AM-PAC PT "6 Clicks" Daily Activity  Outcome Measure  Difficulty turning over in bed (including adjusting bedclothes, sheets and blankets)?: None Difficulty moving from lying on back to sitting on the side of the bed? : A Little Difficulty sitting down on and standing up from a chair with arms (e.g., wheelchair, bedside commode, etc,.)?: A Little Help needed moving to and from a bed to chair (including a wheelchair)?: A Little Help needed walking in hospital  room?: A Little Help needed climbing 3-5 steps with a railing? : A Little 6 Click Score: 19    End of Session Equipment Utilized During Treatment: Gait belt Activity Tolerance: Patient tolerated treatment well Patient left: in chair;with call bell/phone within reach;with family/visitor present;Other (comment)(OT present) Nurse Communication: Mobility status PT Visit Diagnosis: Other abnormalities of gait and mobility (R26.89)     Time: 1035-1050 PT Time Calculation (min) (ACUTE ONLY): 15 min  Charges:  $Gait Training: 8-22 mins                     Earney Navy, PTA Pager: [phone]     Darliss Cheney 09/20/2017, 1:54 PM

## 2017-09-20 NOTE — Progress Notes (Signed)
NURSING PROGRESS NOTE  Justin [name] [id number] Discharge Data: 09/20/2017 10:55 AM Attending Provider: Alma Friendly, MD VZC:HYIFOY, Rebeca Alert, MD     Mardene Celeste to be D/C'd Home per MD order.  Discussed with the patient the After Visit Summary and all questions fully answered. All IV's discontinued with no bleeding noted. All belongings returned to patient for patient to take home.   Last Vital Signs:  Blood pressure 140/71, pulse 70, temperature 98.4 F (36.9 C), temperature source Oral, resp. rate 16, height 5\' 6"  (1.676 m), weight 103.8 kg (228 lb 13.4 oz), SpO2 97 %.  Discharge Medication List Allergies as of 09/20/2017   No Known Allergies     Medication List    TAKE these medications   aspirin 325 MG EC tablet Take 1 tablet (325 mg total) by mouth daily. What changed:    medication strength  how much to take   atorvastatin 80 MG tablet Commonly known as:  LIPITOR Take 1 tablet (80 mg total) by mouth daily. What changed:  how much to take   clopidogrel 75 MG tablet Commonly known as:  PLAVIX Take 1 tablet (75 mg total) by mouth daily.   [facility] VITAMIN B12 1000 MCG tablet Generic drug:  cyanocobalamin Take 12 Units by mouth daily.   fenofibrate 160 MG tablet Take 160 mg by mouth daily.   furosemide 20 MG tablet Commonly known as:  LASIX Take 20 mg by mouth daily.   glipiZIDE 5 MG tablet Commonly known as:  GLUCOTROL Take 5 mg by mouth daily before breakfast.   hydrochlorothiazide 25 MG tablet Commonly known as:  HYDRODIURIL Take 25 mg by mouth daily.   losartan 100 MG tablet Commonly known as:  COZAAR Take 100 mg by mouth daily.   metFORMIN 500 MG 24 hr tablet Commonly known as:  GLUCOPHAGE-XR Take 1,000 mg by mouth 2 (two) times daily.   OZEMPIC 0.25 or 0.5 MG/DOSE Sopn Generic drug:  Semaglutide Inject 0.25 mg into the skin once a week.   polyvinyl alcohol 1.4 % ophthalmic solution Commonly known as:  LIQUIFILM TEARS Place 1 drop  into both eyes 4 (four) times daily as needed for dry eyes.   VITAMIN D-1000 MAX ST 1000 units tablet Generic drug:  Cholecalciferol Take 1,000 Units by mouth daily.            Durable Medical Equipment  (From admission, onward)        Start     Ordered   09/19/17 1357  For home use only DME 3 n 1  Once     09/19/17 1356

## 2017-10-14 ENCOUNTER — Other Ambulatory Visit (INDEPENDENT_AMBULATORY_CARE_PROVIDER_SITE_OTHER): Payer: Self-pay | Admitting: Orthopaedic Surgery

## 2017-10-19 NOTE — Progress Notes (Signed)
[facility] [address] [place]. [place] [address] [phone]       OFFICE FOLLOW UP NOTE  Mr. [name] Date of Birth:  May 12, 1940 Medical Record Number:  [id number]   Reason for Referral:  hospital stroke follow up  CHIEF COMPLAINT:  Chief Complaint  Patient presents with  . New Patient (Initial Visit)    hospital f/u stroke; no worsening of symptom, on aspirin  . 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 History obtained from patient, wife and chart review. Reviewed all radiology images and labs personally.  Justin [name] is a 77 y.o. male with history of hypertension, hyperlipidemia, diabetes mellitus, prostate cancer, and previous cerebral hemorrhage who presented with right-sided weakness. He did not receive IV t-PA due to late presentation.  CT head reviewed and showed hypodensity within the left BG consistent with age-indeterminate infarct.  MRI head reviewed and showed acute/subacute nonhemorrhagic infarct involving the anterior left basal ganglia.  MRA head showed high-grade stenosis of the proximal left P1 segment with a prominent posterior communicating artery.  Carotid Doppler showed bilateral ICA stenosis of 1 to 39%.  2D echo showed an EF of 60 to 65% without cardiac source of embolus.  LDL 115 and recommend increase of Lipitor 40 mg daily to Lipitor 80 mg daily.  A1c 7.7 and recommended tight glycemic control with close PCP follow-up.  HTN stable during admission and recommended long-term BP goal normotensive range.  Patient was on aspirin 81 mg PTA and recommended aspirin 325 mg and Plavix for 3 months and then Plavix alone due to large and small vessel atherosclerosis including left M1 moderate stenosis.  Patient was discharged home  with recommendations of home health PT.  Patient is being seen today for hospital follow-up and is accompanied by his wife.  He no longer has right-sided deficits and they have completely resolved but still has complaints of cognitive issues.  He does continue to work with home PT/OT/ST.  He continues taking both aspirin and Plavix without side effects of bleeding or bruising.  Continues to take Lipitor without side effects myalgias.  Blood pressure today 142/83 but per patient typically 120s/80s.  He also monitors glucose levels at home which have been stable.  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  He denies doing additional activities such as mind exercises or continued speech therapies for continued cognitive concerns.  Wife states that he mainly sits and watches TV most of the day.  Highly encouraged and educated patient on importance of mind exercises and continued activity for improved cognition and prevent deconditioning with possible worsening right-sided weakness.  It was discussed that patient does snore at night and does experience daytime fatigue where it is easy for him to nap during the day despite adequate sleep at night prior. Denies new or worsening stroke/TIA symptoms.   ROS:   14 system review of systems performed and negative with exception of cough, snoring, memory loss, sleepiness and runny nose  PMH:  Past Medical History:  Diagnosis Date  . Brain bleed ([place])   . Cancer [facility])    prostate  . Diabetes mellitus without complication ([place])   .  High cholesterol   . Hypertension     PSH:  Past Surgical History:  Procedure Laterality Date  . arm & hand surgery Left 1972   skill saw accident  . COLON SURGERY     partial  . EYE SURGERY     lens implant and cateract- Left  . PROSTATECTOMY      Social History:  Social History   Socioeconomic History  . Marital status: Married    Spouse name: Not on file  .  Number of children: 1  . Years of education: Not on file  . Highest education level: High school graduate  Occupational History  . Not on file  Social Needs  . Financial resource strain: Not on file  . Food insecurity:    Worry: Not on file    Inability: Not on file  . Transportation needs:    Medical: Not on file    Non-medical: Not on file  Tobacco Use  . Smoking status: Never Smoker  . Smokeless tobacco: Never Used  Substance and Sexual Activity  . Alcohol use: Never    Frequency: Never  . Drug use: Never  . Sexual activity: Not on file  Lifestyle  . Physical activity:    Days per week: Not on file    Minutes per session: Not on file  . Stress: Not on file  Relationships  . Social connections:    Talks on phone: Not on file    Gets together: Not on file    Attends religious service: Not on file    Active member of club or organization: Not on file    Attends meetings of clubs or organizations: Not on file    Relationship status: Not on file  . Intimate partner violence:    Fear of current or ex partner: Not on file    Emotionally abused: Not on file    Physically abused: Not on file    Forced sexual activity: Not on file  Other Topics Concern  . Not on file  Social History Narrative   Lives at home with his wife   Right handed   Caffeine: soft drinks 2-3 cups daily    Family History:  Family History  Problem Relation Age of Onset  . CAD Mother   . Diabetes Mother   . Emphysema Father   . Diabetes Sister   . Diabetes Brother   . Diabetes Sister   . Diabetes Brother   . Diabetes Mellitus II Neg Hx     Medications:   Current Outpatient Medications on File Prior to Visit  Medication Sig Dispense Refill  . aspirin EC 325 MG EC tablet Take 1 tablet (325 mg total) by mouth daily. 30 tablet 0  . Cholecalciferol (VITAMIN D-1000 MAX ST) 1000 units tablet Take 1,000 Units by mouth daily.    . cyanocobalamin ([employer] VITAMIN B12) 1000 MCG tablet Take 1,000 mcg by  mouth daily.     . fenofibrate 160 MG tablet Take 160 mg by mouth daily.    . furosemide (LASIX) 20 MG tablet Take 20 mg by mouth daily.    Marland Kitchen glipiZIDE (GLUCOTROL XL) 10 MG 24 hr tablet Take 10 mg by mouth daily with breakfast.    . hydrochlorothiazide (HYDRODIURIL) 25 MG tablet Take 25 mg by mouth daily.     Marland Kitchen losartan (COZAAR) 100 MG tablet Take 100 mg by mouth daily.    . metFORMIN (GLUCOPHAGE-XR) 500 MG 24 hr tablet Take 1,000 mg by mouth  2 (two) times daily.     . polyvinyl alcohol (LIQUIFILM TEARS) 1.4 % ophthalmic solution Place 1 drop into both eyes 4 (four) times daily as needed for dry eyes.    . Semaglutide (OZEMPIC) 0.25 or 0.5 MG/DOSE SOPN Inject 0.25 mg into the skin once a week.     No current facility-administered medications on file prior to visit.     Allergies:  No Known Allergies   Physical Exam  Vitals:   10/20/17 1330  BP: (!) 142/83  Pulse: 78  Weight: 225 lb (102.1 kg)  Height: 5\' 10"  (1.778 m)   Body mass index is 32.28 kg/m. No exam data present  General: Obese pleasant elderly Caucasian male, seated, in no evident distress Head: head normocephalic and atraumatic.   Neck: supple with no carotid or supraclavicular bruits Cardiovascular: regular rate and rhythm, no murmurs Musculoskeletal: no deformity Skin:  no rash/petichiae Vascular:  Normal pulses all extremities  Neurologic Exam Mental Status: Awake and fully alert. Oriented to place and time. Remote memory intact. Attention span, concentration and fund of knowledge appropriate. Mood and affect appropriate.  Recall 2/3, clock drawing 4/4, animal naming 10 and slight difficulty with serial additions. Cranial Nerves: Fundoscopic exam reveals sharp disc margins. Pupils equal, briskly reactive to light. Extraocular movements full without nystagmus. Visual fields full to confrontation. Hearing intact. Facial sensation intact. Face, tongue, palate moves normally and symmetrically.  Motor: Normal bulk and  tone. Normal strength in all tested extremity muscles. Sensory.: intact to touch , pinprick , position and vibratory sensation.  Coordination: Rapid alternating movements normal in all extremities. Finger-to-nose and heel-to-shin performed accurately bilaterally. Gait and Station: Arises from chair without difficulty. Stance is normal. Gait demonstrates normal stride length and balance . Able to heel, toe and tandem walk without difficulty.  Reflexes: 1+ and symmetric. Toes downgoing.    NIHSS  0 Modified Rankin  2   Diagnostic Data (Labs, Imaging, Testing)  Ct Head Wo Contrast 09/17/2017 IMPRESSION:  1. Hypodensity within the left basal ganglia consistent with age indeterminate infarct, new since 05/31/2013 head CT. Negative for hemorrhage or mass.  2. Atrophy with progression of white matter small vessel ischemic disease. 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   Mr Brain wo contrast Mr Virgel Paling Wo Contrast 09/18/2017 IMPRESSION:  1. Acute/subacute nonhemorrhagic infarct involving the anterior left basal ganglia measures 2.1 x 3.4 x 2.5 cm. Given the degree of atrophy, there is no significant associated mass effect.  2. Atrophy and diffuse white matter disease are moderately advanced for age. This is consistent with chronic microvascular ischemia.  3. Moderate proximal left M1 segment stenosis may be associated with the area of acute infarct as the origin of perforated arteries.  4. Moderate diffuse medium and small vessel disease as described.  5. 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  6. No emergent large vessel occlusion.    Transthoracic Echocardiogram  09/18/2017 Study Conclusions - Left ventricle: The cavity size was normal. Wall thickness was increased in a pattern of mild LVH. Systolic function was normal. The estimated ejection fraction was in the range of 60% to 65%. Wall motion was  normal; there were no regional wall motion abnormalities. Doppler parameters are consistent with abnormal left ventricular relaxation (grade 1 diastolic dysfunction). The E/e&' ratio is <8, suggesting normal LV filling pressure. - Aortic valve: Sclerosis without stenosis. There was trivial regurgitation. - Mitral valve: Calcified annulus. Mildly thickened leaflets .  There was trivial regurgitation. - Left atrium: The atrium was normal in size. - Inferior vena cava: The vessel was normal in size. The respirophasic diameter changes were in the normal range (>= 50%), consistent with normal central venous pressure. Impressions: - LVEF 60-65%, mild LVH, normal wall motion, grade 1 DD, normal LV filling pressure, trivial AI, MR, normal LA size, normal IVC.   Bilateral Carotid Dopplers  09/18/2017 Preliminary report:1-39% ICA plaquing. Vertebral artery flow is antegrade.    ASSESSMENT: Justin [name] is a 77 y.o. 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 Vascular risk factors include DM, HTN, HLD and obesity.  Patient returns today for hospital follow-up and has been stable from a stroke standpoint with only mild residual cognitive deficits.    PLAN: -Continue aspirin 325 mg daily and clopidogrel 75 mg daily  and lipitor 80mg   for secondary stroke prevention - refills provided but request PCP to continue to prescribe in future -Advised to stop aspirin and continue Plavix only after 12/19/2017 as 17-month DAPT completed at this time -F/u with PCP regarding your HLD, HTN and DM management -referral placed for [facility] for OSA work up -continue current therapies of home PT/OT/ST -advised wife that if additional ST therapies are needed for continued cognitive deficits to advise [place] so orders can be placed -Highly encouraged mind exercises such as crossword games, word search, card games, reading  and other exercises provided by ST. -continue to monitor BP at home -advised to continue to stay active and maintain a healthy diet and promote weight loss -Maintain strict control of hypertension with blood pressure goal below 130/90, diabetes with hemoglobin A1c goal below 6.5% and cholesterol with LDL cholesterol (bad cholesterol) goal below 70 mg/dL. I also advised the patient to eat a healthy diet with plenty of whole grains, cereals, fruits and vegetables, exercise regularly and maintain ideal body weight.  Follow up in 3 months or call earlier if needed   Greater than 50% of time during this 25 minute visit was spent on counseling,explanation of diagnosis of left basal ganglia infarct, reviewing risk factor management of DM, HTN and HLD, planning of further management, discussion with patient and family and coordination of care    Venancio Poisson, AGNP-BC  [facility] [address] [place], [place] [address]  Phone [phone] Fax [phone] Note: This document was prepared with digital dictation and possible smart phrase technology. Any transcriptional errors that result from this process are unintentional.

## 2017-10-20 ENCOUNTER — Encounter: Payer: Self-pay | Admitting: Adult Health

## 2017-10-20 ENCOUNTER — Ambulatory Visit (INDEPENDENT_AMBULATORY_CARE_PROVIDER_SITE_OTHER): Payer: Medicare HMO | Admitting: Adult Health

## 2017-10-20 VITALS — BP 142/83 | HR 78 | Ht 70.0 in | Wt 225.0 lb

## 2017-10-20 DIAGNOSIS — E785 Hyperlipidemia, unspecified: Secondary | ICD-10-CM | POA: Diagnosis not present

## 2017-10-20 DIAGNOSIS — I63512 Cerebral infarction due to unspecified occlusion or stenosis of left middle cerebral artery: Secondary | ICD-10-CM | POA: Diagnosis not present

## 2017-10-20 DIAGNOSIS — I10 Essential (primary) hypertension: Secondary | ICD-10-CM

## 2017-10-20 DIAGNOSIS — E119 Type 2 diabetes mellitus without complications: Secondary | ICD-10-CM | POA: Diagnosis not present

## 2017-10-20 MED ORDER — ATORVASTATIN CALCIUM 80 MG PO TABS: 80.0000 mg | ORAL_TABLET | Freq: Every day | ORAL | 4 refills | Status: DC

## 2017-10-20 MED ORDER — CLOPIDOGREL BISULFATE 75 MG PO TABS: 75.0000 mg | ORAL_TABLET | Freq: Every day | ORAL | 4 refills | Status: DC

## 2017-10-20 NOTE — Patient Instructions (Addendum)
Continue aspirin 325 mg daily and clopidogrel 75 mg daily  and lipitor 80mg   for secondary stroke prevention - refills provided - request PCP to continue to prescribe in the future  Stop aspirin end of October and continue plavix only   Continue to follow up with PCP regarding cholesterol, blood pressure and diabetes management   Do mind exercises such as suduku, cross word, word search, card games and reading - [place]!!   Continue speech, occupation and physical therapy  Referral placed for sleep apnea work up - you will be called to schedule appointment   Continue to monitor blood pressure at home  Maintain strict control of hypertension with blood pressure goal below 130/90, diabetes with hemoglobin A1c goal below 6.5% and cholesterol with LDL cholesterol (bad cholesterol) goal below 70 mg/dL. I also advised the patient to eat a healthy diet with plenty of whole grains, cereals, fruits and vegetables, exercise regularly and maintain ideal body weight.  Followup in the future with me in 3 months or call earlier if needed       Thank you for coming to see [place] at [facility]. I hope we have been able to provide you high quality care today.  You may receive a patient satisfaction survey over the next few weeks. We would appreciate your feedback and comments so that we may continue to improve ourselves and the health of our patients.

## 2017-10-21 NOTE — Progress Notes (Signed)
I agree with the above plan 

## 2017-11-09 ENCOUNTER — Encounter: Payer: Self-pay | Admitting: Neurology

## 2017-11-09 ENCOUNTER — Ambulatory Visit (INDEPENDENT_AMBULATORY_CARE_PROVIDER_SITE_OTHER): Payer: Medicare HMO | Admitting: Neurology

## 2017-11-09 VITALS — BP 144/79 | HR 81 | Ht 70.0 in | Wt 226.0 lb

## 2017-11-09 DIAGNOSIS — E669 Obesity, unspecified: Secondary | ICD-10-CM | POA: Diagnosis not present

## 2017-11-09 DIAGNOSIS — R0683 Snoring: Secondary | ICD-10-CM | POA: Diagnosis not present

## 2017-11-09 DIAGNOSIS — Z8673 Personal history of transient ischemic attack (TIA), and cerebral infarction without residual deficits: Secondary | ICD-10-CM

## 2017-11-09 DIAGNOSIS — M7989 Other specified soft tissue disorders: Secondary | ICD-10-CM

## 2017-11-09 DIAGNOSIS — R400 Somnolence: Secondary | ICD-10-CM

## 2017-11-09 NOTE — Patient Instructions (Signed)

## 2017-11-09 NOTE — Progress Notes (Signed)
Subjective:    Patient ID: Justin [name] is a 77 y.o. male.  HPI     Star Age, MD, PhD [facility] [address], [address] [place], [place] [address]  Dear Janett Billow,   I saw your patient, [name], upon your kind request, in my sleep clinic today for initial consultation of her sleep disorder, concern for underlying obstructive sleep apnea. The patient is accompanied by his wife today. As you know, [name] is a 77 year old right-handed gentleman with an underlying medical history of hypertension, prostate cancer, diabetes, remote history of hand and arm injury due to saw accident, hyperlipidemia, left basal ganglia stroke in July 2019, and obesity, who reports snoring and excessive daytime somnolence. I reviewed your office note from 10/20/2017. His Epworth sleepiness score is 24 out of 24, fatigue score is 36 out of 63. He is retired, lives with his wife, they have 1 child. He is a nonsmoker and does not utilize alcohol, drinks caffeine in the form of diet soda. He is trying to hydrate well. He is minimally verbal, not very forthcoming with information but is answering questions appropriately. His bedtime is around 11 PM by his report, his wife adds that he falls asleep in his recliner in the family room on most evenings and may not go to bed at all or eventually go to bed in the early morning hours. He has nocturia once per average night. Some answers he only gives by hand gestures. He denies morning headaches, rise time between 6 and 7 AM typically. He is retired from [employer]. He never smoked, does not report a family history of sleep apnea, has been trying to lose weight, and the past 2 years he has lost about 40 pounds. He has never had a tonsillectomy. His wife has noted pauses in his breathing while he is asleep. He has finished outpatient therapy but may be restarting occupational therapy and speech therapy.  His Past Medical History Is Significant  For: Past Medical History:  Diagnosis Date  . Brain bleed ([place])   . Cancer [facility])    prostate  . Diabetes mellitus without complication ([place])   . High cholesterol   . Hypertension     His Past Surgical History Is Significant For: Past Surgical History:  Procedure Laterality Date  . arm & hand surgery Left 1972   skill saw accident  . COLON SURGERY     partial  . EYE SURGERY     lens implant and cateract- Left  . PROSTATECTOMY      His Family History Is Significant For: Family History  Problem Relation Age of Onset  . CAD Mother   . Diabetes Mother   . Emphysema Father   . Diabetes Sister   . Diabetes Brother   . Diabetes Sister   . Diabetes Brother   . Diabetes Mellitus II Neg Hx     His Social History Is Significant For: Social History   Socioeconomic History  . Marital status: Married    Spouse name: Not on file  . Number of children: 1  . Years of education: Not on file  . Highest education level: High school graduate  Occupational History  . Not on file  Social Needs  . Financial resource strain: Not on file  . Food insecurity:    Worry: Not on file    Inability: Not on file  . Transportation needs:    Medical: Not on file  Non-medical: Not on file  Tobacco Use  . Smoking status: Never Smoker  . Smokeless tobacco: Never Used  Substance and Sexual Activity  . Alcohol use: Never    Frequency: Never  . Drug use: Never  . Sexual activity: Not on file  Lifestyle  . Physical activity:    Days per week: Not on file    Minutes per session: Not on file  . Stress: Not on file  Relationships  . Social connections:    Talks on phone: Not on file    Gets together: Not on file    Attends religious service: Not on file    Active member of club or organization: Not on file    Attends meetings of clubs or organizations: Not on file    Relationship status: Not on file  Other Topics Concern  . Not on file  Social History Narrative   Lives at home with  his wife   Right handed   Caffeine: soft drinks 2-3 cups daily    His Allergies Are:  No Known Allergies:   His Current Medications Are:  Outpatient Encounter Medications as of 11/09/2017  Medication Sig  . aspirin EC 325 MG EC tablet Take 1 tablet (325 mg total) by mouth daily.  Marland Kitchen atorvastatin (LIPITOR) 80 MG tablet Take 1 tablet (80 mg total) by mouth daily.  . Cholecalciferol (VITAMIN D-1000 MAX ST) 1000 units tablet Take 1,000 Units by mouth daily.  . clopidogrel (PLAVIX) 75 MG tablet Take 1 tablet (75 mg total) by mouth daily.  . cyanocobalamin ([employer] VITAMIN B12) 1000 MCG tablet Take 1,000 mcg by mouth daily.   . furosemide (LASIX) 20 MG tablet Take 20 mg by mouth daily.  Marland Kitchen glipiZIDE (GLUCOTROL XL) 10 MG 24 hr tablet Take 10 mg by mouth daily with breakfast.  . hydrochlorothiazide (HYDRODIURIL) 25 MG tablet Take 25 mg by mouth daily.   Marland Kitchen losartan (COZAAR) 100 MG tablet Take 100 mg by mouth daily.  . metFORMIN (GLUCOPHAGE-XR) 500 MG 24 hr tablet Take 1,000 mg by mouth 2 (two) times daily.   . polyvinyl alcohol (LIQUIFILM TEARS) 1.4 % ophthalmic solution Place 1 drop into both eyes 4 (four) times daily as needed for dry eyes.  . Semaglutide (OZEMPIC) 0.25 or 0.5 MG/DOSE SOPN Inject 0.25 mg into the skin once a week.  . fenofibrate 160 MG tablet Take 160 mg by mouth daily.   No facility-administered encounter medications on file as of 11/09/2017.   :  Review of Systems:  Out of a complete 14 point review of systems, all are reviewed and negative with the exception of these symptoms as listed below: Review of Systems  Neurological:       Pt presents today to discuss his sleep. Pt has never had a sleep study but does endorse snoring.  Epworth Sleepiness Scale 0= would never doze 1= slight chance of dozing 2= moderate chance of dozing 3= high chance of dozing  Sitting and reading: 3 Watching TV: 3 Sitting inactive in a public place (ex. Theater or meeting): 3 As a passenger in  a car for an hour without a break: 3 Lying down to rest in the afternoon: 3 Sitting and talking to someone: 3 Sitting quietly after lunch (no alcohol): 3 In a car, while stopped in traffic: 3 Total: 24     Objective:  Neurological Exam  Physical Exam Physical Examination:   Vitals:   11/09/17 0837  BP: (!) 144/79  Pulse: 81  General Examination: The patient is a very pleasant 77 y.o. male in no acute distress. He appears well-developed and well-nourished and well groomed.   HEENT: Normocephalic, atraumatic, pupils are equal, round and reactive to light and accommodation. Extraocular tracking is good without limitation to gaze excursion or nystagmus noted. Normal smooth pursuit is noted. Hearing appear slightly impaired. Face is symmetric, no obvious facial asymmetry. Speech is not dysarthric but scant. He has no tremor in the head and neck area. Airway examination reveals mild mouth dryness, a few missing teeth, moderate airway crowding secondary to smaller airway entry and redundant soft palate, larger appearing uvula, tonsils are on the small side, Mallampati is class III. Neck circumference is 18-3/8 inches. He has a mild overbite. No carotid bruits.  Chest: Clear to auscultation without wheezing, rhonchi or crackles noted.  Heart: S1+S2+0, regular and normal without murmurs, rubs or gallops noted.   Abdomen: Soft, non-tender and non-distended with normal bowel sounds appreciated on auscultation.  Extremities: There is 2+ pitting edema in the distal lower extremities bilaterally.   Skin: Warm and dry without trophic changes noted.  Musculoskeletal: exam reveals no obvious joint deformities, tenderness or joint swelling or erythema.   Neurologically:  Mental status: The patient is awake, alert and oriented in all 4 spheres. His immediate and remote memory, attention, language skills and fund of knowledge are appropriate. There is no evidence of aphasia, agnosia, apraxia or  anomia. Mood is normal and affect is normal.  Cranial nerves II - XII are as described above under HEENT exam. In addition: shoulder shrug is normal with equal shoulder height noted. Motor exam: Normal bulk, strength and tone is noted , minimal right hip flexor weakness noted. There is no drift, tremor or rebound. Romberg is negative. Fine motor skills and coordination: grossly intact with normal finger taps, normal hand movements, normal rapid alternating patting, normal foot taps and normal foot agility.  Cerebellar testing: No dysmetria or intention tremor. There is no truncal or gait ataxia.  Sensory exam: intact to light touch.  Gait, station and balance: He stands easily. No veering to one side is noted. No leaning to one side is noted. Posture is age-appropriate and stance is narrow based. Gait shows normal stride length and normal pace. No problems turning are noted.   Assessment and Plan:  In summary, TASHON CAPP is a very pleasant 77 y.o.-year old male with an underlying medical history of hypertension, prostate cancer, diabetes, remote history of hand and arm injury due to saw accident, hyperlipidemia, left basal ganglia stroke in July 2019, and obesity, whose history and physical exam are indeed concerning for obstructive sleep apnea (OSA). I had a long chat with the patient and his wife about my findings and the diagnosis of OSA, its prognosis and treatment options. We talked about medical treatments, surgical interventions and non-pharmacological approaches. I explained in particular the risks and ramifications of untreated moderate to severe OSA, especially with respect to developing cardiovascular disease down the Road, including congestive heart failure, difficult to treat hypertension, cardiac arrhythmias, or stroke. Even type 2 diabetes has, in part, been linked to untreated OSA. Symptoms of untreated OSA include daytime sleepiness, memory problems, mood irritability and mood disorder  such as depression and anxiety, lack of energy, as well as recurrent headaches, especially morning headaches. We talked about trying to maintain a healthy lifestyle in general, as well as the importance of weight control. I encouraged the patient to eat healthy, exercise daily and keep well  hydrated, to keep a scheduled bedtime and wake time routine, to not skip any meals and eat healthy snacks in between meals. I advised the patient not to drive when feeling sleepy. I recommended the following at this time: sleep study with potential positive airway pressure titration. (We will score hypopneas at 4%).  We talked about the importance of secondary stroke prevention. I explained the sleep test procedure to the patient and also outlined possible surgical and non-surgical treatment options of OSA, including the use of a custom-made dental device (which would require a referral to a specialist dentist or oral surgeon), upper airway surgical options, such as pillar implants, radiofrequency surgery, tongue base surgery, and UPPP (which would involve a referral to an ENT surgeon). Rarely, jaw surgery such as mandibular advancement may be considered.  I also explained the CPAP treatment option to the patient, who indicated that he would be willing to try CPAP if the need arises. I explained the importance of being compliant with PAP treatment, not only for insurance purposes but primarily to improve His symptoms, and for the patient's long term health benefit, including to reduce His cardiovascular risks. I answered all their questions today and the patient and his wife were in agreement. I would like to see him back after the sleep study is completed and encouraged them to call with any interim questions, concerns, problems or updates.   Thank you very much for allowing me to participate in the care of this nice patient. If I can be of any further assistance to you please do not hesitate to talk to  me.  Sincerely,   Star Age, MD, PhD

## 2017-11-11 ENCOUNTER — Ambulatory Visit: Payer: Medicare HMO | Admitting: Physical Therapy

## 2017-11-23 ENCOUNTER — Ambulatory Visit: Payer: Medicare HMO

## 2017-11-23 ENCOUNTER — Ambulatory Visit: Payer: Medicare HMO | Admitting: Physical Therapy

## 2017-11-28 ENCOUNTER — Telehealth: Payer: Self-pay | Admitting: Neurology

## 2017-11-28 NOTE — Telephone Encounter (Signed)
We have attempted to call the patient 2 times to schedule sleep study. Patient has been unavailable at the phone numbers we have on file and has not returned our calls. At this point we will send a letter asking pt to please contact the sleep lab to schedule their sleep study. If patient calls back we will schedule them for their sleep study. ° °

## 2017-12-07 ENCOUNTER — Other Ambulatory Visit: Payer: Self-pay

## 2017-12-07 ENCOUNTER — Emergency Department (HOSPITAL_BASED_OUTPATIENT_CLINIC_OR_DEPARTMENT_OTHER): Payer: Medicare HMO

## 2017-12-07 ENCOUNTER — Emergency Department (HOSPITAL_BASED_OUTPATIENT_CLINIC_OR_DEPARTMENT_OTHER)
Admission: EM | Admit: 2017-12-07 | Discharge: 2017-12-07 | Disposition: A | Discharge status: Home / Self Care | Payer: Medicare HMO | Attending: Emergency Medicine | Admitting: Emergency Medicine

## 2017-12-07 ENCOUNTER — Encounter (HOSPITAL_BASED_OUTPATIENT_CLINIC_OR_DEPARTMENT_OTHER): Payer: Self-pay | Admitting: *Deleted

## 2017-12-07 DIAGNOSIS — Z7982 Long term (current) use of aspirin: Secondary | ICD-10-CM | POA: Insufficient documentation

## 2017-12-07 DIAGNOSIS — E119 Type 2 diabetes mellitus without complications: Secondary | ICD-10-CM | POA: Insufficient documentation

## 2017-12-07 DIAGNOSIS — M25561 Pain in right knee: Secondary | ICD-10-CM | POA: Insufficient documentation

## 2017-12-07 DIAGNOSIS — Z79899 Other long term (current) drug therapy: Secondary | ICD-10-CM | POA: Insufficient documentation

## 2017-12-07 DIAGNOSIS — R531 Weakness: Secondary | ICD-10-CM | POA: Diagnosis not present

## 2017-12-07 DIAGNOSIS — I10 Essential (primary) hypertension: Secondary | ICD-10-CM | POA: Diagnosis not present

## 2017-12-07 DIAGNOSIS — Z7984 Long term (current) use of oral hypoglycemic drugs: Secondary | ICD-10-CM | POA: Diagnosis not present

## 2017-12-07 LAB — COMPREHENSIVE METABOLIC PANEL
ALT: 24 U/L (ref 0–44)
AST: 22 U/L (ref 15–41)
Albumin: 4.3 g/dL (ref 3.5–5.0)
Alkaline Phosphatase: 41 U/L (ref 38–126)
Anion gap: 12 (ref 5–15)
BUN: 16 mg/dL (ref 8–23)
CO2: 28 mmol/L (ref 22–32)
Calcium: 9.7 mg/dL (ref 8.9–10.3)
Chloride: 98 mmol/L (ref 98–111)
Creatinine, Ser: 0.82 mg/dL (ref 0.61–1.24)
GFR calc Af Amer: >60 mL/min (ref >60)
GFR calc non Af Amer: >60 mL/min (ref >60)
Glucose, Bld: 157 mg/dL — ABNORMAL HIGH (ref 70–99)
Potassium: 3.5 mmol/L (ref 3.5–5.1)
Sodium: 138 mmol/L (ref 135–145)
Total Bilirubin: 1.6 mg/dL — ABNORMAL HIGH (ref 0.3–1.2)
Total Protein: 7.9 g/dL (ref 6.5–8.1)

## 2017-12-07 LAB — CBC WITH DIFFERENTIAL/PLATELET
Abs Immature Granulocytes: 0.05 10*3/uL (ref 0.00–0.07)
Basophils Absolute: 0.1 10*3/uL (ref 0.0–0.1)
Basophils Relative: 1 %
Eosinophils Absolute: 0.1 10*3/uL (ref 0.0–0.5)
Eosinophils Relative: 1 %
HCT: 50.1 % (ref 39.0–52.0)
Hemoglobin: 16.7 g/dL (ref 13.0–17.0)
Immature Granulocytes: 1 %
Lymphocytes Relative: 24 %
Lymphs Abs: 2.6 10*3/uL (ref 0.7–4.0)
MCH: 28.1 pg (ref 26.0–34.0)
MCHC: 33.3 g/dL (ref 30.0–36.0)
MCV: 84.2 fL (ref 80.0–100.0)
Monocytes Absolute: 0.9 10*3/uL (ref 0.1–1.0)
Monocytes Relative: 8 %
Neutro Abs: 7.3 10*3/uL (ref 1.7–7.7)
Neutrophils Relative %: 65 %
Platelets: 298 10*3/uL (ref 150–400)
RBC: 5.95 MIL/uL — ABNORMAL HIGH (ref 4.22–5.81)
RDW: 13.2 % (ref 11.5–15.5)
WBC: 11.1 10*3/uL — ABNORMAL HIGH (ref 4.0–10.5)
nRBC: 0 % (ref 0.0–0.2)

## 2017-12-07 LAB — URINALYSIS, ROUTINE W REFLEX MICROSCOPIC
Bilirubin Urine: NEGATIVE
Glucose, UA: 100 mg/dL — AB
Hgb urine dipstick: NEGATIVE
Ketones, ur: NEGATIVE mg/dL
Leukocytes, UA: NEGATIVE
Nitrite: NEGATIVE
Protein, ur: NEGATIVE mg/dL
Specific Gravity, Urine: >1.03 — ABNORMAL HIGH (ref 1.005–1.030)
pH: 6 (ref 5.0–8.0)

## 2017-12-07 MED ORDER — LACTATED RINGERS IV BOLUS
1000.0000 mL | Freq: Once | INTRAVENOUS | Status: AC
  Administered 2017-12-07: 1000 mL via INTRAVENOUS

## 2017-12-07 NOTE — ED Provider Notes (Signed)
[facility] EMERGENCY DEPARTMENT Provider Note   CSN: [id number] Arrival date & time: 12/07/17  1434     History   Chief Complaint Chief Complaint  Patient presents with  . Weakness    HPI Justin [name] is a 77 y.o. male.  Patient with history of stroke who presents the ED after mechanical fall.  Patient with right knee pain.  Patient states that his right lower leg gave out on him while walking this morning.  Patient denies any has had.  Has had right-sided weakness for last several months since his stroke.  Has a walker that he ambulates with intermittently.  Has had difficulty with walking since the fall this morning.  He denies any chest pain, shortness of breath, urinary symptoms.  Denies any infectious symptoms.  Denies hitting his head or any loss of consciousness.  The history is provided by the patient.  Fall  This is a new problem. The current episode started 6 to 12 hours ago. The problem has been resolved. Pertinent negatives include no chest pain, no abdominal pain, no headaches and no shortness of breath. Nothing aggravates the symptoms. Nothing relieves the symptoms. He has tried nothing for the symptoms. The treatment provided no relief.    Past Medical History:  Diagnosis Date  . Brain bleed ([place])   . Cancer [facility])    prostate  . Diabetes mellitus without complication ([place])   . High cholesterol   . Hypertension     Patient Active Problem List   Diagnosis Date Noted  . Acute CVA (cerebrovascular accident) ([place]) 09/18/2017  . Stroke (cerebrum) ([place]) 09/17/2017  . Diabetes mellitus without complication ([place]) 40/97/3532  . Closed head injury 04/22/2013  . HLD (hyperlipidemia) 07/29/2009  . Essential hypertension 07/29/2009  . EFFUSION, PLEURAL 07/29/2009  . DIVERTICULITIS, COLON 07/29/2009  . 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 COLONIC POLYPS, HYPERPLASTIC, HX OF 07/29/2009    Past Surgical History:  Procedure Laterality Date  . arm & hand  surgery Left 1972   skill saw accident  . COLON SURGERY     partial  . EYE SURGERY     lens implant and cateract- Left  . PROSTATECTOMY          Home Medications    Prior to Admission medications   Medication Sig Start Date End Date Taking? Authorizing Provider  aspirin EC 325 MG EC tablet Take 1 tablet (325 mg total) by mouth daily. 09/20/17   Alma Friendly, MD  atorvastatin (LIPITOR) 80 MG tablet Take 1 tablet (80 mg total) by mouth daily. 10/20/17   Venancio Poisson, NP  Cholecalciferol (VITAMIN D-1000 MAX ST) 1000 units tablet Take 1,000 Units by mouth daily.    [provider]  clopidogrel (PLAVIX) 75 MG tablet Take 1 tablet (75 mg total) by mouth daily. 10/20/17   Venancio Poisson, NP  cyanocobalamin (CVS VITAMIN B12) 1000 MCG tablet Take 1,000 mcg by mouth daily.     [provider]  fenofibrate 160 MG tablet Take 160 mg by mouth daily. 10/08/16 10/20/17  [provider]  furosemide (LASIX) 20 MG tablet Take 20 mg by mouth daily. 10/04/16 11/12/17  [provider]  glipiZIDE (GLUCOTROL XL) 10 MG 24 hr tablet Take 10 mg by mouth daily with breakfast.    [provider]  hydrochlorothiazide (HYDRODIURIL) 25 MG tablet Take 25 mg by mouth daily.     [provider]  losartan (COZAAR) 100 MG tablet Take 100 mg by mouth daily.  [provider]  metFORMIN (GLUCOPHAGE-XR) 500 MG 24 hr tablet Take 1,000 mg by mouth 2 (two) times daily.     [provider]  polyvinyl alcohol (LIQUIFILM TEARS) 1.4 % ophthalmic solution Place 1 drop into both eyes 4 (four) times daily as needed for dry eyes.    [provider]  Semaglutide (OZEMPIC) 0.25 or 0.5 MG/DOSE SOPN Inject 0.25 mg into the skin once a week.    [provider]    Family History Family History  Problem Relation Age of Onset  . CAD Mother   . Diabetes Mother   . Emphysema Father   . Diabetes Sister   . Diabetes Brother   . Diabetes  Sister   . Diabetes Brother   . Diabetes Mellitus II Neg Hx     Social History Social History   Tobacco Use  . Smoking status: Never Smoker  . Smokeless tobacco: Never Used  Substance Use Topics  . Alcohol use: Never    Frequency: Never  . Drug use: Never     Allergies   Patient has no known allergies.   Review of Systems Review of Systems  Constitutional: Negative for chills and fever.  HENT: Negative for ear pain and sore throat.   Eyes: Negative for pain and visual disturbance.  Respiratory: Negative for cough and shortness of breath.   Cardiovascular: Negative for chest pain and palpitations.  Gastrointestinal: Negative for abdominal pain and vomiting.  Genitourinary: Negative for dysuria and hematuria.  Musculoskeletal: Positive for arthralgias and gait problem. Negative for back pain.  Skin: Negative for color change and rash.  Neurological: Negative for seizures, syncope and headaches.  All other systems reviewed and are negative.    Physical Exam Updated Vital Signs  ED Triage Vitals  Enc Vitals Group     BP 12/07/17 1438 (!) 156/89     Pulse Rate 12/07/17 1438 91     Resp 12/07/17 1438 18     Temp 12/07/17 1438 98.3 F (36.8 C)     Temp Source 12/07/17 1438 Oral     SpO2 12/07/17 1438 99 %     Weight 12/07/17 1439 225 lb (102.1 kg)     Height 12/07/17 1439 5\' 11"  (1.803 m)     Head Circumference --      Peak Flow --      Pain Score 12/07/17 1438 0     Pain Loc --      Pain Edu? --      Excl. in [place]? --    \  Physical Exam  Constitutional: He is oriented to person, place, and time. He appears well-developed and well-nourished.  HENT:  Head: Normocephalic and atraumatic.  Eyes: Pupils are equal, round, and reactive to light. Conjunctivae and EOM are normal.  Neck: Normal range of motion. Neck supple.  Cardiovascular: Normal rate, regular rhythm, normal heart sounds and intact distal pulses.  No murmur heard. Pulmonary/Chest: Effort normal and  breath sounds normal. No respiratory distress.  Abdominal: Soft. There is no tenderness.  Musculoskeletal: Normal range of motion. He exhibits tenderness (TTP to right knee). He exhibits no edema.  Neurological: He is alert and oriented to person, place, and time. No cranial nerve deficit or sensory deficit. He exhibits normal muscle tone. Coordination normal.  5+ out of 5 strength throughout, normal sensation throughout, no drift, normal finger-to-nose finger.  Skin: Skin is warm and dry.  Psychiatric: He has a normal mood and affect.  Nursing note and  vitals reviewed.    ED Treatments / Results  Labs (all labs ordered are listed, but only abnormal results are displayed) Labs Reviewed  CBC WITH DIFFERENTIAL/PLATELET - Abnormal; Notable for the following components:      Result Value   WBC 11.1 (*)    RBC 5.95 (*)    All other components within normal limits  COMPREHENSIVE METABOLIC PANEL - Abnormal; Notable for the following components:   Glucose, Bld 157 (*)    Total Bilirubin 1.6 (*)    All other components within normal limits  URINALYSIS, ROUTINE W REFLEX MICROSCOPIC - Abnormal; Notable for the following components:   Color, Urine AMBER (*)    Specific Gravity, Urine >1.030 (*)    Glucose, UA 100 (*)    All other components within normal limits    EKG EKG Interpretation  Date/Time:  Wednesday December 07 2017 14:48:20 EDT Ventricular Rate:  91 PR Interval:    QRS Duration: 144 QT Interval:  386 QTC Calculation: 475 R Axis:   -12 Text Interpretation:  Sinus rhythm Right bundle branch block Baseline wander in lead(s) V4 Confirmed by Lennice Sites [phone]) on 12/07/2017 2:54:24 PM   Radiology Ct Head Wo Contrast  Result Date: 12/07/2017 CLINICAL DATA:  [place] 7 hours ago, RIGHT leg weakness, history prostate cancer, diabetes mellitus, hypertension, prior intracranial hemorrhage and stroke EXAM: CT HEAD WITHOUT CONTRAST TECHNIQUE: Contiguous axial images were obtained  from the base of the skull through the vertex without intravenous contrast. Sagittal and coronal MPR images reconstructed from axial data set. COMPARISON:  09/17/2017 FINDINGS: Brain: Generalized atrophy. Normal ventricular morphology. No midline shift or mass effect. Small vessel chronic ischemic changes of deep cerebral white matter. Old lacunar infarct RIGHT basal ganglia. Large old LEFT basal ganglia infarct. No intracranial hemorrhage, mass lesion, evidence of acute infarction, or extra-axial fluid collection. Vascular: Atherosclerotic calcification within internal carotid arteries bilaterally at skull base Skull: Intact Sinuses/Orbits: Clear Other: N/A IMPRESSION: Atrophy with small vessel chronic ischemic changes of deep cerebral white matter. Old LEFT dorsal ganglia infarct and RIGHT basal ganglia lacunar infarct. No acute intracranial abnormalities. Electronically Signed   By: Lavonia Dana M.D.   On: 12/07/2017 16:08   Dg Knee Complete 4 Views Right  Result Date: 12/07/2017 CLINICAL DATA:  Right medial knee pain, no acute trauma EXAM: RIGHT KNEE - COMPLETE 4+ VIEW COMPARISON:  None. FINDINGS: There is bicompartmental degenerative joint disease of the right knee mainly involving the medial and patellofemoral compartments. The lateral compartment is relatively well preserved. No fracture is seen and no joint effusion is noted. There may be small loose bodies in the joint space. IMPRESSION: 1. 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 2. Cannot exclude small loose bodies in the joint space. Electronically Signed   By: Ivar Drape M.D.   On: 12/07/2017 16:00    Procedures Procedures (including critical care time)  Medications Ordered in ED Medications  lactated ringers bolus 1,000 mL ( Intravenous Stopped 12/07/17 1922)     Initial Impression / Assessment and Plan / ED Course  I have reviewed the triage vital  signs and the nursing notes.  Pertinent labs & imaging results that were available during my care of the patient were reviewed by me and considered in my medical decision making (see chart for details).     Justin [name] is a 76 year old male with history of stroke on Plavix who presents the ED after  mechanical fall.  Patient with unremarkable vitals.  No fever.  Patient states that his right leg gave out on him today while he was walking.  Had a recent stroke and has right-sided deficits at baseline.  Typically ambulates with a walker but was not using it today.  Patient denies hitting his head.  Fall happened earlier this morning.  Patient has normal neurological exam.  Some tenderness around his right knee.  No obvious laxity of his knee.  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  EKG performed in triage is unremarkable.  Patient with no chest pain.  No syncope symptoms.  Basic blood work was obtained that showed no significant leukocytosis, anemia, electrolyte abnormality.  Urinalysis was unremarkable.  Right knee x-ray shows arthritic changes.  Possibly bone spurs.  Head CT was performed that was overall unremarkable.  Patient is able to bear weight without much issues.  Recommend that he follow-up with his primary care doctor.  May need more physical therapy.  Recommend possible follow-up with orthopedics to discuss possible intervention.  Possible knee sprain.  Discharged form ED in good condition.  Told to return to ED if symptoms worsen.  This chart was dictated using voice recognition software.  Despite best efforts to proofread,  errors can occur which can change the documentation meaning.   Final Clinical Impressions(s) / ED Diagnoses   Final diagnoses:  Acute pain of right knee    ED Discharge Orders    None       Lennice Sites, DO 12/07/17 2027

## 2017-12-07 NOTE — ED Notes (Signed)
At bedside to perform stroke swallow screen- pt drinking water family provided after EDP voiced he was okay to have something to drink. Pt denies coughing, choking or difficulties swallowing.

## 2017-12-07 NOTE — ED Triage Notes (Signed)
Pt c/o fall x 7 hrs ago with right leg weakness. HX stroke

## 2017-12-15 ENCOUNTER — Encounter: Payer: Self-pay | Admitting: Physical Therapy

## 2017-12-15 ENCOUNTER — Ambulatory Visit: Payer: Medicare HMO | Attending: Physician Assistant | Admitting: Physical Therapy

## 2017-12-15 ENCOUNTER — Ambulatory Visit: Payer: Medicare HMO | Admitting: Speech Pathology

## 2017-12-15 ENCOUNTER — Other Ambulatory Visit: Payer: Self-pay

## 2017-12-15 DIAGNOSIS — R2681 Unsteadiness on feet: Secondary | ICD-10-CM | POA: Diagnosis present

## 2017-12-15 DIAGNOSIS — R4701 Aphasia: Secondary | ICD-10-CM | POA: Diagnosis present

## 2017-12-15 DIAGNOSIS — M6281 Muscle weakness (generalized): Secondary | ICD-10-CM | POA: Insufficient documentation

## 2017-12-15 DIAGNOSIS — R41841 Cognitive communication deficit: Secondary | ICD-10-CM

## 2017-12-15 DIAGNOSIS — R2689 Other abnormalities of gait and mobility: Secondary | ICD-10-CM | POA: Insufficient documentation

## 2017-12-15 NOTE — Therapy (Signed)
[facility] [address] [place], [place], [address] Phone: [phone]   Fax:  [phone]  Physical Therapy Evaluation  Patient Details  Name: Justin [name] MRN: [id number] Date of Birth: 10/11/1940 Referring Provider (PT): Roe Coombs   Encounter Date: 12/15/2017  PT End of Session - 12/15/17 0923    Visit Number  1    Number of Visits  18    Date for PT Re-Evaluation  03/15/18    Authorization Type  [employer] Medicare-will need 10th visit progress note    Authorization Time Period  Medicare cert period:  67/34/19-3/79/02    PT Start Time  0820    PT Stop Time  0910    PT Time Calculation (min)  50 min    Activity Tolerance  Patient tolerated treatment well    Behavior During Therapy  [facility] for tasks assessed/performed       Past Medical History:  Diagnosis Date  . Brain bleed ([place])   . Cancer [facility])    prostate  . Diabetes mellitus without complication ([place])   . High cholesterol   . Hypertension     Past Surgical History:  Procedure Laterality Date  . arm & hand surgery Left 1972   skill saw accident  . COLON SURGERY     partial  . EYE SURGERY     lens implant and cateract- Left  . PROSTATECTOMY      There were no vitals filed for this visit.   Subjective Assessment - 12/15/17 0826    Subjective  Pt reports having HHPT after having stroke at home.  He reports pain in R hip, R knee, R ankle and back.  R knee gives out faster than anything else.  Not using an assistive device.  Pt was in ED last week due to R knee pain, after a fall.  He reports has had 3 falls since the CVA, due to R leg giving way.    Patient is accompained by:  Family member   wife, [name]   Patient Stated Goals  Pt's goal for therapy is to be able to work around the house more (with less fatigue).  To improve walking and stop dragging R foot.    Currently in Pain?  No/denies   No pain at eval        [facility] PT Assessment -  12/15/17 0833      Assessment   Medical Diagnosis  CVA    Referring Provider (PT)  Roe Coombs    Onset Date/Surgical Date  09/17/17   CVA; MD order 11/08/17   Hand Dominance  Right      Precautions   Precautions  Fall   No driving     Balance Screen   Has the patient fallen in the past 6 months  Yes    How many times?  3   since CVA   Has the patient had a decrease in activity level because of a fear of falling?   Yes    Is the patient reluctant to leave their home because of a fear of falling?   Yes      [place]  Private residence    Living Arrangements  Spouse/significant other    Available Help at Discharge  Family    Type of [place] to enter    Entrance Stairs-Number of Steps  2    Entrance Stairs-Rails  Left    Home Layout  One level    Home Equipment  None      Prior Function   Level of Independence  Independent    Vocation  Retired    Leisure  Enjoyed Haematologist, had a Manufacturing systems engineer business; Location manager      Observation/Other Assessments   Focus on Therapeutic Outcomes (FOTO)   Staff did not capture- pt info not input by front office staff      Tone   Assessment Location  Right Lower Extremity      ROM / Strength   AROM / PROM / Strength  Strength      Strength   Overall Strength  Deficits    Strength Assessment Site  Hip;Knee;Ankle    Right/Left Hip  Right;Left    Right Hip Flexion  4/5    Left Hip Flexion  5/5    Right/Left Knee  Right;Left    Right Knee Flexion  5/5    Right Knee Extension  4/5    Left Knee Flexion  5/5    Left Knee Extension  5/5    Right/Left Ankle  Right;Left    Right Ankle Dorsiflexion  4/5    Left Ankle Dorsiflexion  4/5      Transfers   Transfers  Sit to Stand;Stand to Sit    Sit to Stand  6: Modified independent (Device/Increase time);With upper extremity assist;Without upper extremity assist;From chair/3-in-1    Five time sit to stand comments   22.65    Stand to Sit  6:  Modified independent (Device/Increase time);With upper extremity assist;Without upper extremity assist;To chair/3-in-1      Ambulation/Gait   Ambulation/Gait  Yes    Ambulation/Gait Assistance  5: Supervision    Ambulation Distance (Feet)  200 Feet    Assistive device  None    Gait Pattern  Step-through pattern;Decreased step length - right;Decreased dorsiflexion - right;Trendelenburg;Lateral trunk lean to right;Poor foot clearance - right    Ambulation Surface  Level;Indoor    Gait velocity  14.03 sec = 2.34 ft/sec      Standardized Balance Assessment   Standardized Balance Assessment  Timed Up and Go Test;Dynamic Gait Index;Berg Balance Test      Berg Balance Test   Sit to Stand  Able to stand without using hands and stabilize independently    Standing Unsupported  Able to stand safely 2 minutes    Sitting with Back Unsupported but Feet Supported on Floor or Stool  Able to sit safely and securely 2 minutes    Stand to Sit  Sits safely with minimal use of hands    Transfers  Able to transfer safely, minor use of hands    Standing Unsupported with Eyes Closed  Able to stand 10 seconds with supervision    Standing Ubsupported with Feet Together  Able to place feet together independently and stand 1 minute safely    From Standing, Reach Forward with Outstretched Arm  Can reach forward >12 cm safely (5")    From Standing Position, Pick up Object from Floor  Able to pick up shoe, needs supervision    From Standing Position, Turn to Look Behind Over each Shoulder  Looks behind from both sides and weight shifts well    Turn 360 Degrees  Able to turn 360 degrees safely but slowly    Standing Unsupported, Alternately Place Feet on Step/Stool  Able to stand independently and safely and complete 8 steps in 20 seconds  Standing Unsupported, One Foot in Front  Able to plae foot ahead of the other independently and hold 30 seconds    Standing on One Leg  Tries to lift leg/unable to hold 3 seconds  but remains standing independently    Total Score  47    Berg comment:  Scores <45/56 indicate increased fall ris      Dynamic Gait Index   Level Surface  Moderate Impairment   9.75   Change in Gait Speed  Mild Impairment    Gait with Horizontal Head Turns  Mild Impairment    Gait with Vertical Head Turns  Mild Impairment    Gait and Pivot Turn  Mild Impairment    Step Over Obstacle  Mild Impairment    Step Around Obstacles  Mild Impairment    Steps  Mild Impairment    Total Score  15    DGI comment:  Scores <19/24 indicate increased fall risk      Timed Up and Go Test   Normal TUG (seconds)  16.75   18.37   TUG Comments  Scores >13.5 seconds indicates increased fall risk      RLE Tone   RLE Tone  Mild                Objective measurements completed on examination: See above findings.              PT Education - 12/15/17 0921    Education Details  Educated in tests, measures, fall risk per TUG and DGI; discussed importance of performance of HEP and therapy recommendations at home, to bring HEP from HHPT to next therapy session; cues for increased effort for swing through/heelstrike on RLE with gait.    Person(s) Educated  Patient;Spouse    Methods  Explanation;Demonstration    Comprehension  Verbalized understanding;Returned demonstration;Verbal cues required       PT Short Term Goals - 12/15/17 1159      PT SHORT TERM GOAL #1   Title  Pt will perform HEP with wife's supervision for improved strength, balance, gait.  TARGET 01/20/18    Time  5    Period  Weeks    Status  New    Target Date  01/20/18      PT SHORT TERM GOAL #2   Title  Pt will improve 5x sit<>stand to less than or equal to 18 seconds for improved transfer efficiency and safety.    Time  5    Period  Weeks    Status  New    Target Date  01/20/18      PT SHORT TERM GOAL #3   Title  Pt will improve TUG score to less than or equal to 13.5 seconds for decreased fall risk.    Time   5    Period  Weeks    Status  New    Target Date  01/20/18      PT SHORT TERM GOAL #4   Title  Pt will improve DGI score to at least 18/24 for decreased fall risk.    Time  5    Period  Weeks    Status  New    Target Date  01/20/18      PT SHORT TERM GOAL #5   Title  Pt/wife will verbalize understanding of fall prevention in home environment.    Time  5    Period  Weeks    Status  New    Target Date  01/20/18        PT Long Term Goals - 12/15/17 1203      PT LONG TERM GOAL #1   Title  Pt will be independent with progression of HEP for improved strength, balance, gait.  TARGET 02/17/18    Time  9    Period  Weeks    Status  New    Target Date  02/17/18      PT LONG TERM GOAL #2   Title  Pt will improve gait velocity to at least 2.62 ft/sec for decreased fall risk, improved gait efficiency.    Time  9    Period  Weeks    Status  New    Target Date  02/17/18      PT LONG TERM GOAL #3   Title  Pt will improve DGI 20/24 for decreased fall risk.    Time  9    Period  Weeks    Status  New    Target Date  02/17/18      PT LONG TERM GOAL #4   Title  Pt will ambulate at least 1000 ft, indoors and outdoor surfaces, independently for improved outdoor gait efficiency and safety.    Time  9    Period  Weeks    Status  New    Target Date  02/17/18      PT LONG TERM GOAL #5   Title  Pt will perform floor>stand transfer with UE support, modified independently, for improved fall recovery.    Time  9    Period  Weeks    Status  New    Target Date  02/17/18             Plan - 12/15/17 0924    Clinical Impression Statement  Pt is a 77 year old male who presents to OP PT status post CVA with R sided weakness in July 2019.  He has had HHPT, which ended approximately 1 month ago.  He presents to PT today with decreased RLE strength, decreased balance, decreased timing and coordination with gait, hx of 3 falls since CVA, difficulty (per report) getting up from floor after  falls, fall risk per DGI and TUG scores.  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  He will benefit from skilled PT to address the above stated deficits to decrease fall risk and improve functional mobility.    History and Personal Factors relevant to plan of care:  PMH >3 co-morbidities, independent PTA, decreased balance, strength, gait    Clinical Presentation  Evolving    Clinical Presentation due to:  fall risk per DGI and TUG, 3 falls since CVA    Clinical Decision Making  Moderate    Rehab Potential  Good    PT Frequency  2x / week    PT Duration  Other (comment)   9 weeks   PT Treatment/Interventions  ADLs/Self Care Home Management;DME Instruction;Therapeutic exercise;Balance training;Therapeutic activities;Functional mobility training;Stair training;Gait training;Neuromuscular re-education;Patient/family education    PT Next Visit Plan  Initiate HEP (for R hip strengthening, RLE strengthening, standing balance/gait activities); gait for improved R foot clearance/heelstrike    Consulted and Agree with Plan of Care  Patient;Family member/caregiver       Patient will benefit from skilled therapeutic intervention in order to improve the following deficits and impairments:  Abnormal gait, Decreased activity tolerance, Decreased balance, Decreased coordination, Decreased mobility, Decreased strength, Difficulty walking  Visit Diagnosis: Other abnormalities  of gait and mobility  Muscle weakness (generalized)  Unsteadiness on feet     Problem List Patient Active Problem List   Diagnosis Date Noted  . Acute CVA (cerebrovascular accident) ([place]) 09/18/2017  . 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 Diabetes mellitus without complication ([place]) 88/32/5498  . Closed head injury 04/22/2013  . HLD (hyperlipidemia) 07/29/2009  . Essential hypertension 07/29/2009  . EFFUSION, PLEURAL 07/29/2009  . DIVERTICULITIS, COLON 07/29/2009  . HYPERBILIRUBINEMIA  07/29/2009  . COLONIC POLYPS, HYPERPLASTIC, HX OF 07/29/2009    Denielle Bayard W. 12/15/2017, 12:14 PM  Frazier Butt., PT   [facility] [address] [place], [place], [address] Phone: [phone]   Fax:  [phone]  Name: Justin [name] MRN: [id number] Date of Birth: Oct 23, 1940

## 2017-12-15 NOTE — Therapy (Signed)
[facility] [address] [place], [place], [address] Phone: [phone]   Fax:  [phone]  Speech Language Pathology Evaluation  Patient Details  Name: Justin [name] MRN: [id number] Date of Birth: 07/06/1940 Referring Provider (SLP): Aura Dials, [place]   Encounter Date: 12/15/2017  End of Session - 12/15/17 1017    Visit Number  1    Number of Visits  17    Date for SLP Re-Evaluation  03/15/18    SLP Start Time  0930    SLP Stop Time   5366    SLP Time Calculation (min)  49 min    Activity Tolerance  Patient tolerated treatment well       Past Medical History:  Diagnosis Date  . Brain bleed ([place])   . Cancer [facility])    prostate  . Diabetes mellitus without complication ([place])   . High cholesterol   . Hypertension     Past Surgical History:  Procedure Laterality Date  . arm & hand surgery Left 1972   skill saw accident  . COLON SURGERY     partial  . EYE SURGERY     lens implant and cateract- Left  . PROSTATECTOMY      There were no vitals filed for this visit.  Subjective Assessment - 12/15/17 1111    Subjective  "I had PT this morning and they worked me hard"    Patient is accompained by:  Family member   wife   Currently in Pain?  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 He has had reported cognitive impairment since this acute event.     Behavioral/Cognition  Pleasant and cooperative    Mobility Status  Ambulated without assistive device to ST eval      Balance Screen   Has the patient fallen in the past 6  months  --   See PT eval     Prior Functional Status   Cognitive/Linguistic Baseline  Within functional limits   Prior to CVA reportedly Pt had no deficits   Type of Home  House     Lives With  Spouse    Education  12th grade    Vocation  Retired      Associate Professor   Overall Cognitive Status  Impaired/Different from baseline    Area of Impairment  Attention;Memory;Safety/judgement;Awareness;Problem solving    Current Attention Level  Sustained    Attention Comments  Impulsivity negatively impacts pt's accuracy    Memory  Decreased short-term memory    Memory Comments  achieved 11/18 on Story Retelling; answered basic y/n questions pertaining to story with 50% accuracy    Safety/Judgement  Decreased awareness of safety;Decreased awareness of deficits    Safety and Judgement Comments  Pt often answered "no difficulty" with correction by wife: "Yes he has problems with that"    Awareness  Intellectual    Awareness Comments  Poor awareness of impairments    Problem Solving  Slow processing;Requires verbal  cues   Impulsive   Attention  Sustained    Sustained Attention  Impaired    Sustained Attention Impairment  Verbal basic    Memory  Impaired    Memory Impairment  Decreased recall of new information;Decreased short term memory    Decreased Short Term Memory  Verbal basic    Awareness  Impaired    Awareness Impairment  Intellectual impairment   No awareness of impairment    Problem Solving  Impaired    Problem Solving Impairment  Verbal basic    Executive Function  Reasoning;Organizing;Decision Making;Self Monitoring;Self Correcting    Reasoning  Impaired    Reasoning Impairment  Verbal basic    Organizing  Impaired    Organizing Impairment  Verbal basic    Decision Making  Impaired    Decision Making Impairment  Verbal basic    Self Monitoring  Impaired    Self Monitoring Impairment  Verbal basic    Self Correcting  Impaired    Self Correcting Impairment  Verbal basic       Auditory Comprehension   Overall Auditory Comprehension  Impaired   secondary to attention impulsivity vs linguistic     Visual Recognition/Discrimination   Discrimination  Not tested      Reading Comprehension   Reading Status  Not tested   will assess further     Expression   Primary Mode of Expression  Verbal      Verbal Expression   Overall Verbal Expression  Appears within functional limits for tasks assessed    Initiation  No impairment    Level of Generative/Spontaneous Verbalization  Conversation      Written Expression   Dominant Hand  Right      Oral Motor/Sensory Function   Overall Oral Motor/Sensory Function  Appears within functional limits for tasks assessed      Standardized Assessments   Standardized Assessments   Cognitive Linguistic Quick Test   Impairment (Mild) scored in: Memory, Language, Visuospatial     Cognitive Linguistic Quick Test (Ages 18-69)   Attention  WNL    Memory  Mild    Executive Function  WNL    Language  Mild    Visuospatial Skills  Mild    Severity Rating Total  17    Composite Severity Rating  14.6                      SLP Education - 12/15/17 1257    Education Details  Reviewed deficit areas and course of ST    Person(s) Educated  Patient;Spouse    Methods  Explanation    Comprehension  Verbalized understanding       SLP Short Term Goals - 12/15/17 1207      SLP SHORT TERM GOAL #1   Title  Complete objective reading comprehension assessment within first 3 ST therapy sessions     Time  4    Period  Weeks    Status  New    Target Date  --      SLP SHORT TERM GOAL #2   Title  Pt will tell SLP 2 deficits over 3 sessions    Time  4    Period  Weeks    Status  New    Target Date  --      SLP SHORT TERM GOAL #3   Title  Pt will demo sustained attention with a simple cognitive linguistic therapy task for 20 minutes with occasional  min A back to task over 3 sessions    Time  4    Period  Weeks    Status   New    Target Date  --      SLP SHORT TERM GOAL #4   Title  Will establish memory system and will bring to therapy 3 sessions    Time  4    Period  Weeks    Status  New    Target Date  --       SLP Long Term Goals - 12/15/17 1219      SLP LONG TERM GOAL #1   Title  Pt will demo emergent awareness with mod complex task by identifying 70% of errors over 3 sessions with min occasional A     Time  8    Period  Weeks    Status  New    Target Date  --      SLP LONG TERM GOAL #2   Title  Pt will demo sustained/selective attention necessary for 40 minutes therapy tasks over 5 sessions     Time  8    Period  Weeks    Status  New    Target Date  --      SLP LONG TERM GOAL #3   Title  Pt will demo adequate alternating attention between two mod complex cognitive tasks to achieve 80% success on each task over two sessions     Time  8    Period  Weeks    Status  New    Target Date  --      SLP LONG TERM GOAL #4   Title  Pt will demo use of memory system outside of therapy over 4 therapy sessions per patient/spouse report    Time  8    Period  Weeks    Status  New       Plan - 12/15/17 1200    Clinical Impression Statement  Pt was assessed by the CLQT and presents today with mild/moderate cognitive impairments in memory, sustained attention, awareness and executive functioning. Pt also demonstrates generalized conversation lacking specific information; he reports "I want to be able to talk normal and not have to go around the tree to get to my point". Furthermore, Wife reports Pt has difficulty with reading comprehension (even with a menu when out to eat) Plan to further assess reading comprehension. Skilled ST is recommended to address the above cognitive deficits to facilitate independence, functional safety and decrease carryover burden and improve Pt's overall QOL.    Speech Therapy Frequency  2x / week    Duration  --   8 weeks or 17 visits   Treatment/Interventions  Language  facilitation;Environmental controls;SLP instruction and feedback;Compensatory techniques;Cognitive reorganization;Functional tasks;Patient/family education;Compensatory strategies;Multimodal communcation approach    Potential to Achieve Goals  Good    Potential Considerations  Other (comment)   poor awareness of deficits   Consulted and Agree with Plan of Care  Patient;Family member/caregiver       Patient will benefit from skilled therapeutic intervention in order to improve the following deficits and impairments:   Cognitive communication deficit  Aphasia    Problem List Patient Active Problem List   Diagnosis Date Noted  . Acute CVA (cerebrovascular accident) ([place]) 09/18/2017  . 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 Diabetes mellitus without complication ([place]) 73/71/0626  . Closed head injury 04/22/2013  . HLD (hyperlipidemia) 07/29/2009  . Essential hypertension 07/29/2009  . EFFUSION, PLEURAL  07/29/2009  . DIVERTICULITIS, COLON 07/29/2009  . 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 COLONIC POLYPS, HYPERPLASTIC, HX OF 07/29/2009   Janathan Bribiesca H. Roddie Mc, CCC-SLP Speech Language Pathologist  Wende Bushy 12/15/2017, 1:17 PM  [facility] [address] [place], [place], [address] Phone: [phone]   Fax:  [phone]  Name: MAXIMILIEN HAYASHI MRN: [id number] Date of Birth: 14-Aug-1940

## 2017-12-20 ENCOUNTER — Ambulatory Visit: Payer: Medicare HMO

## 2017-12-20 ENCOUNTER — Ambulatory Visit: Payer: Medicare HMO | Admitting: Physical Therapy

## 2017-12-20 DIAGNOSIS — R2689 Other abnormalities of gait and mobility: Secondary | ICD-10-CM

## 2017-12-20 DIAGNOSIS — R4701 Aphasia: Secondary | ICD-10-CM

## 2017-12-20 DIAGNOSIS — M6281 Muscle weakness (generalized): Secondary | ICD-10-CM

## 2017-12-20 DIAGNOSIS — R41841 Cognitive communication deficit: Secondary | ICD-10-CM

## 2017-12-20 DIAGNOSIS — R2681 Unsteadiness on feet: Secondary | ICD-10-CM

## 2017-12-20 NOTE — Patient Instructions (Signed)
HEP created today and he showed good understanding and return demonstration. Access Code: [email or web address]  URL: [email or web address]  Date: 12/20/2017  Prepared by: Elsie Ra   Exercises  Supine Bridge - 10 reps - 3 sets - 2x daily - 6x weekly  Sidelying Hip Abduction - 10 reps - 1-3 sets - 2x daily - 6x weekly  Seated Long Arc Quad - 10 reps - 2-3 sets - 2x daily - 6x weekly  Sit to Stand without Arm Support - 10 reps - 1 sets - 2x daily - 6x weekly  Step Up - 10 reps - 3 sets - 2x daily - 6x weekly  Lateral Step Up - 10 reps - 3 sets - 2x daily - 6x weekly  Side Stepping with Counter Support - 3-5 reps - 1 sets - 2x daily - 6x weekly

## 2017-12-20 NOTE — Therapy (Signed)
[facility] [address] [place], [place], [address] Phone: [phone]   Fax:  [phone]  Speech Language Pathology Treatment  Patient Details  Name: Justin [name] MRN: [id number] Date of Birth: [birth year] Referring Provider (SLP): Aura Dials, [place]   Encounter Date: 12/20/2017  End of Session - 12/20/17 1105    Visit Number  2    Number of Visits  17    Date for SLP Re-Evaluation  03/15/18    SLP Start Time  1018    SLP Stop Time   1100    SLP Time Calculation (min)  42 min    Activity Tolerance  Patient tolerated treatment well       Past Medical History:  Diagnosis Date  . Brain bleed ([place])   . Cancer [facility])    prostate  . Diabetes mellitus without complication ([place])   . High cholesterol   . Hypertension     Past Surgical History:  Procedure Laterality Date  . arm & hand surgery Left 1972   skill saw accident  . COLON SURGERY     partial  . EYE SURGERY     lens implant and cateract- Left  . PROSTATECTOMY      There were no vitals filed for this visit.  Subjective Assessment - 12/20/17 1042    Subjective  Pt greeted SLP upon entering ST room    Currently in Pain?  No/denies            ADULT SLP TREATMENT - 12/20/17 1047      General Information   Behavior/Cognition  Alert;Cooperative;Pleasant mood      Treatment Provided   Treatment provided  Cognitive-Linquistic      Cognitive-Linquistic Treatment   Treatment focused on  Aphasia    Skilled Treatment  SLP adminstered the Reading Comprenension Battery for Aphasia. Pt success at word level was notably better than at sentence and paragraph level. Goal/s added.       Assessment / Recommendations / Plan   Plan  Goals updated;Continue with current plan of care      Progression Toward Goals   Progression toward goals  Progressing toward goals       SLP Education - 12/20/17 1104    Education Details  adding goal/s for reading  comprehension    Person(s) Educated  Patient    Methods  Explanation    Comprehension  Verbalized understanding;Need further instruction       SLP Short Term Goals - 12/20/17 1412      SLP SHORT TERM GOAL #1   Title  Complete objective reading comprehension assessment within first 3 ST therapy sessions     Time  4    Period  Weeks    Status  On-going      SLP SHORT TERM GOAL #2   Title  Pt will tell SLP 2 deficits over 3 sessions    Time  4    Period  Weeks    Status  On-going      SLP SHORT TERM GOAL #3   Title  Pt will demo sustained attention with a simple cognitive linguistic therapy task for 20 minutes with occasional min A back to task over 3 sessions    Time  4    Period  Weeks    Status  On-going      SLP SHORT TERM GOAL #4   Title  Will establish memory system and will bring to therapy 3  sessions    Time  4    Period  Weeks    Status  On-going      SLP SHORT TERM GOAL #5   Title  pt will indicate understanding of written sentence level material 90% success over three sessions    Time  4    Period  Weeks    Status  New       SLP Long Term Goals - 12/20/17 1415      SLP LONG TERM GOAL #1   Title  Pt will demo emergent awareness with mod complex task by identifying 70% of errors over 3 sessions with min occasional A     Time  8    Period  Weeks    Status  On-going      SLP LONG TERM GOAL #2   Title  Pt will demo sustained/selective attention necessary for 40 minutes therapy tasks over 5 sessions     Time  8    Period  Weeks    Status  On-going      SLP LONG TERM GOAL #3   Title  Pt will demo adequate alternating attention between two mod complex cognitive tasks to achieve 80% success on each task over two sessions     Time  8    Period  Weeks    Status  On-going      SLP LONG TERM GOAL #4   Title  Pt will demo use of memory system outside of therapy over 4 therapy sessions per patient/spouse report    Time  8    Period  Weeks    Status  On-going       SLP LONG TERM GOAL #5   Title  Pt will demo understanding of functional written langugae (ariticles, multi-sentence texts, etc) with 100% sucess with copmensations    Time  8    Period  Weeks    Status  New       Plan - 12/20/17 1410    Clinical Impression Statement  Pt was assessed today using Reading Comprehenstion Battery for Aphasia and noted incr'd difficulty with stimuli > word length. Possible rare instance of impulsivity and/or inattention affecting pt's responses. SLP repeated directions moreso than usual for this patient in effort to keep pt responses valid. Skilled ST is cont'd recommended to address the above cognitive deficits and reading comprehension to facilitate independence, functional safety and decrease carryover burden and improve Pt's overall QOL.    Speech Therapy Frequency  2x / week    Duration  --   8 weeks or 17 visits   Treatment/Interventions  Language facilitation;Environmental controls;SLP instruction and feedback;Compensatory techniques;Cognitive reorganization;Functional tasks;Patient/family education;Compensatory strategies;Multimodal communcation approach    Potential to Achieve Goals  Good    Potential Considerations  Other (comment)   poor awareness of deficits   Consulted and Agree with Plan of Care  Patient;Family member/caregiver       Patient will benefit from skilled therapeutic intervention in order to improve the following deficits and impairments:   Aphasia  Cognitive communication deficit    Problem List Patient Active Problem List   Diagnosis Date Noted  . Acute CVA (cerebrovascular accident) ([place]) 09/18/2017  . 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 Diabetes mellitus without complication ([place]) 16/11/9602  . Closed head injury 04/22/2013  . HLD (hyperlipidemia) 07/29/2009  . Essential hypertension 07/29/2009  . EFFUSION, PLEURAL 07/29/2009  . DIVERTICULITIS, COLON 07/29/2009  . 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 COLONIC POLYPS,  HYPERPLASTIC, HX  OF 07/29/2009    [facility] ,[place], [place]  12/20/2017, 2:24 PM  [facility] [address] [place], [place], [address] Phone: [phone]   Fax:  [phone]   Name: Justin [name] MRN: [id number] Date of Birth: 1940/06/23

## 2017-12-20 NOTE — Therapy (Signed)
[facility] [address] [place], [place], [address] Phone: [phone]   Fax:  [phone]  Physical Therapy Treatment  Patient Details  Name: Justin [name] MRN: [id number] Date of Birth: 03/10/1940 Referring Provider (PT): Roe Coombs   Encounter Date: 12/20/2017  PT End of Session - 12/20/17 1019    Visit Number  2    Number of Visits  18    Date for PT Re-Evaluation  03/15/18    Authorization Type  [employer] Medicare-will need 10th visit progress note    Authorization Time Period  Medicare cert period:  16/01/09-05/14/53    PT Start Time  0935    PT Stop Time  1015    PT Time Calculation (min)  40 min    Activity Tolerance  Patient tolerated treatment well    Behavior During Therapy  [facility] for tasks assessed/performed       Past Medical History:  Diagnosis Date  . Brain bleed ([place])   . Cancer [facility])    prostate  . Diabetes mellitus without complication ([place])   . High cholesterol   . Hypertension     Past Surgical History:  Procedure Laterality Date  . arm & hand surgery Left 1972   skill saw accident  . COLON SURGERY     partial  . EYE SURGERY     lens implant and cateract- Left  . PROSTATECTOMY      There were no vitals filed for this visit.  Subjective Assessment - 12/20/17 1015    Subjective  Pt reports no complaints today, he denies any pain.    Patient Stated Goals  Pt's goal for therapy is to be able to work around the house more (with less fatigue).  To improve walking and stop dragging R foot.    Currently in Pain?  No/denies                       [facility] Adult PT Treatment/Exercise - 12/20/17 0001      High Level Balance   High Level Balance Activities  Side stepping;Backward walking;Tandem walking    High Level Balance Comments  in // bars down/back X 3 ea      Exercises   Exercises  Knee/Hip      Knee/Hip Exercises: Aerobic   Nustep  L5 X 5 min       Knee/Hip  Exercises: Standing   Other Standing Knee Exercises  heel toe raises X 15 ea, cues for posture and inc height with toe raises      Exercises for LE strength performed today Supine Bridge - 10 reps, cues to keep pelvis level Sidelying Hip Abduction - 10 reps  Cues for technique to not move into flexion Seated Long Arc Quad - 20 reps bilat  Sit to Stand without Arm Support - 2 sets of 5  Step Up - Rt 15 reps  Lateral Step Up - Rt 15 reps           PT Education - 12/20/17 1018    Education Details  hep    Person(s) Educated  Patient    Methods  Explanation;Demonstration;Verbal cues;Handout    Comprehension  Verbalized understanding;Returned demonstration       PT Short Term Goals - 12/15/17 1159      PT SHORT TERM GOAL #1   Title  Pt will perform HEP with wife's supervision for improved strength, balance, gait.  TARGET 01/20/18  Time  5    Period  Weeks    Status  New    Target Date  01/20/18      PT SHORT TERM GOAL #2   Title  Pt will improve 5x sit<>stand to less than or equal to 18 seconds for improved transfer efficiency and safety.    Time  5    Period  Weeks    Status  New    Target Date  01/20/18      PT SHORT TERM GOAL #3   Title  Pt will improve TUG score to less than or equal to 13.5 seconds for decreased fall risk.    Time  5    Period  Weeks    Status  New    Target Date  01/20/18      PT SHORT TERM GOAL #4   Title  Pt will improve DGI score to at least 18/24 for decreased fall risk.    Time  5    Period  Weeks    Status  New    Target Date  01/20/18      PT SHORT TERM GOAL #5   Title  Pt/wife will verbalize understanding of fall prevention in home environment.    Time  5    Period  Weeks    Status  New    Target Date  01/20/18        PT Long Term Goals - 12/15/17 1203      PT LONG TERM GOAL #1   Title  Pt will be independent with progression of HEP for improved strength, balance, gait.  TARGET 02/17/18    Time  9    Period  Weeks     Status  New    Target Date  02/17/18      PT LONG TERM GOAL #2   Title  Pt will improve gait velocity to at least 2.62 ft/sec for decreased fall risk, improved gait efficiency.    Time  9    Period  Weeks    Status  New    Target Date  02/17/18      PT LONG TERM GOAL #3   Title  Pt will improve DGI 20/24 for decreased fall risk.    Time  9    Period  Weeks    Status  New    Target Date  02/17/18      PT LONG TERM GOAL #4   Title  Pt will ambulate at least 1000 ft, indoors and outdoor surfaces, independently for improved outdoor gait efficiency and safety.    Time  9    Period  Weeks    Status  New    Target Date  02/17/18      PT LONG TERM GOAL #5   Title  Pt will perform floor>stand transfer with UE support, modified independently, for improved fall recovery.    Time  9    Period  Weeks    Status  New    Target Date  02/17/18            Plan - 12/20/17 1019    Clinical Impression Statement  Session focused on Rt LE strengthening and dynamic balance today. He was issued HEP to incorporate these at home and he showed good return demonstration with these today. He was overall supervsion with balance training in // bars and he is able to catch himself when he needs to but had overall good performance with balance  today. He does require 3 short rest breaks during sesison due to fatique. PT will continue to progress strength, balance, and endurace as able.     Rehab Potential  Good    PT Frequency  2x / week    PT Duration  Other (comment)    PT Treatment/Interventions  ADLs/Self Care Home Management;DME Instruction;Therapeutic exercise;Balance training;Therapeutic activities;Functional mobility training;Stair training;Gait training;Neuromuscular re-education;Patient/family education    PT Next Visit Plan   R hip strengthening, RLE strengthening, standing balance/gait activities); gait for improved R foot clearance/heelstrike    Consulted and Agree with Plan of Care   Patient       Patient will benefit from skilled therapeutic intervention in order to improve the following deficits and impairments:  Abnormal gait, Decreased activity tolerance, Decreased balance, Decreased coordination, Decreased mobility, Decreased strength, Difficulty walking  Visit Diagnosis: Other abnormalities of gait and mobility  Muscle weakness (generalized)  Unsteadiness on feet     Problem List Patient Active Problem List   Diagnosis Date Noted  . Acute CVA (cerebrovascular accident) ([place]) 09/18/2017  . 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 Diabetes mellitus without complication ([place]) 29/19/1660  . Closed head injury 04/22/2013  . HLD (hyperlipidemia) 07/29/2009  . Essential hypertension 07/29/2009  . EFFUSION, PLEURAL 07/29/2009  . DIVERTICULITIS, COLON 07/29/2009  . 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 COLONIC POLYPS, HYPERPLASTIC, HX OF 07/29/2009    Debbe Odea 12/20/2017, 10:23 AM  [facility] [address] [place], [place], [address] Phone: [phone]   Fax:  [phone]  Name: Justin [name] MRN: [id number] Date of Birth: 1940-04-13

## 2017-12-23 ENCOUNTER — Ambulatory Visit: Payer: Medicare HMO | Admitting: Speech Pathology

## 2017-12-23 ENCOUNTER — Ambulatory Visit: Payer: Medicare HMO | Attending: Physician Assistant

## 2017-12-23 ENCOUNTER — Encounter: Payer: Self-pay | Admitting: Speech Pathology

## 2017-12-23 DIAGNOSIS — R4701 Aphasia: Secondary | ICD-10-CM | POA: Insufficient documentation

## 2017-12-23 DIAGNOSIS — R2689 Other abnormalities of gait and mobility: Secondary | ICD-10-CM | POA: Diagnosis present

## 2017-12-23 DIAGNOSIS — M6281 Muscle weakness (generalized): Secondary | ICD-10-CM | POA: Diagnosis present

## 2017-12-23 DIAGNOSIS — R2681 Unsteadiness on feet: Secondary | ICD-10-CM | POA: Diagnosis present

## 2017-12-23 DIAGNOSIS — R41841 Cognitive communication deficit: Secondary | ICD-10-CM

## 2017-12-23 NOTE — Therapy (Signed)
[facility] [address] [place], [place], [address] Phone: [phone]   Fax:  [phone]  Physical Therapy Treatment  Patient Details  Name: Justin [name] MRN: [id number] Date of Birth: [birth year] Referring Provider (PT): Roe Coombs   Encounter Date: 12/23/2017  PT End of Session - 12/23/17 0940    Visit Number  3    Number of Visits  18    Date for PT Re-Evaluation  03/15/18    Authorization Type  [employer] Medicare-will need 10th visit progress note    Authorization Time Period  Medicare cert period:  09/98/33-10/17/03    PT Start Time  0930    PT Stop Time  1016    PT Time Calculation (min)  46 min    Activity Tolerance  Patient tolerated treatment well    Behavior During Therapy  [facility] for tasks assessed/performed       Past Medical History:  Diagnosis Date  . Brain bleed ([place])   . Cancer [facility])    prostate  . Diabetes mellitus without complication ([place])   . High cholesterol   . Hypertension     Past Surgical History:  Procedure Laterality Date  . arm & hand surgery Left 1972   skill saw accident  . COLON SURGERY     partial  . EYE SURGERY     lens implant and cateract- Left  . PROSTATECTOMY      There were no vitals filed for this visit.  Subjective Assessment - 12/23/17 0937    Subjective  HEP is going well, wife states pt isn't doing them as often as he should. No falls to report.     Patient is accompained by:  Family member   [name])   Patient Stated Goals  Pt's goal for therapy is to be able to work around the house more (with less fatigue).  To improve walking and stop dragging R foot.    Currently in Pain?  No/denies        [facility] Adult PT Treatment/Exercise - 12/23/17 0941      Ambulation/Gait   Ambulation/Gait  Yes    Ambulation/Gait Assistance  5: Supervision    Ambulation/Gait Assistance Details  Ambulating in hallway scanning, head turns/nods, stop and turn, change in speed  with no LOB noted, minimal deviation from path.     Ambulation Distance (Feet)  600 Feet    Assistive device  None    Gait Pattern  Step-through pattern;Decreased step length - right;Decreased dorsiflexion - right;Trendelenburg;Lateral trunk lean to right;Poor foot clearance - right    Ambulation Surface  Level;Indoor      High Level Balance   High Level Balance Activities  Side stepping;Backward walking;Marching forwards;Marching backwards;Tandem walking;Negotiating over obstacles    High Level Balance Comments  at counter on blue mat with intermittent SUE use, including toe tap to cones, side stepping over cones as well. x5 laps each, mod cues for proper technique.       Knee/Hip Exercises: Standing   Other Standing Knee Exercises  At counter for support performing BLE strengthening of Hip abduction, hip extension, marches, heel raises, toe raises with 2# ankle weight 10reps/2sets with VC's for proper form, posture and slow controlled descent.         PT Short Term Goals - 12/15/17 1159      PT SHORT TERM GOAL #1   Title  Pt will perform HEP with wife's supervision for improved strength, balance, gait.  TARGET  01/20/18    Time  5    Period  Weeks    Status  New    Target Date  01/20/18      PT SHORT TERM GOAL #2   Title  Pt will improve 5x sit<>stand to less than or equal to 18 seconds for improved transfer efficiency and safety.    Time  5    Period  Weeks    Status  New    Target Date  01/20/18      PT SHORT TERM GOAL #3   Title  Pt will improve TUG score to less than or equal to 13.5 seconds for decreased fall risk.    Time  5    Period  Weeks    Status  New    Target Date  01/20/18      PT SHORT TERM GOAL #4   Title  Pt will improve DGI score to at least 18/24 for decreased fall risk.    Time  5    Period  Weeks    Status  New    Target Date  01/20/18      PT SHORT TERM GOAL #5   Title  Pt/wife will verbalize understanding of fall prevention in home environment.     Time  5    Period  Weeks    Status  New    Target Date  01/20/18        PT Long Term Goals - 12/15/17 1203      PT LONG TERM GOAL #1   Title  Pt will be independent with progression of HEP for improved strength, balance, gait.  TARGET 02/17/18    Time  9    Period  Weeks    Status  New    Target Date  02/17/18      PT LONG TERM GOAL #2   Title  Pt will improve gait velocity to at least 2.62 ft/sec for decreased fall risk, improved gait efficiency.    Time  9    Period  Weeks    Status  New    Target Date  02/17/18      PT LONG TERM GOAL #3   Title  Pt will improve DGI 20/24 for decreased fall risk.    Time  9    Period  Weeks    Status  New    Target Date  02/17/18      PT LONG TERM GOAL #4   Title  Pt will ambulate at least 1000 ft, indoors and outdoor surfaces, independently for improved outdoor gait efficiency and safety.    Time  9    Period  Weeks    Status  New    Target Date  02/17/18      PT LONG TERM GOAL #5   Title  Pt will perform floor>stand transfer with UE support, modified independently, for improved fall recovery.    Time  9    Period  Weeks    Status  New    Target Date  02/17/18        Plan - 12/23/17 1105    Clinical Impression Statement  Todays skilled session focused on gait training with no AD while scanning, performing head movements and sudden change in speed and direction, high level balance on compliant surfaces with decreased UE reliance, and BLE strengtheining in standing. Pt is making steady progress and should benefit from continued PT sessions to progress towards unmet goals.  Rehab Potential  Good    PT Frequency  2x / week    PT Duration  Other (comment)    PT Treatment/Interventions  ADLs/Self Care Home Management;DME Instruction;Therapeutic exercise;Balance training;Therapeutic activities;Functional mobility training;Stair training;Gait training;Neuromuscular re-education;Patient/family education    PT Next Visit Plan   Continue R hip strengthening, RLE strengthening, standing balance/gait activities); gait for improved R foot clearance/heelstrike, gait with obstacles/hurdles/compliant surfaces.     PT Home Exercise Plan  Access Code: [email or web address]     Consulted and Agree with Plan of Care  Patient       Patient will benefit from skilled therapeutic intervention in order to improve the following deficits and impairments:  Abnormal gait, Decreased activity tolerance, Decreased balance, Decreased coordination, Decreased mobility, Decreased strength, Difficulty walking  Visit Diagnosis: Other abnormalities of gait and mobility  Muscle weakness (generalized)  Unsteadiness on feet     Problem List Patient Active Problem List   Diagnosis Date Noted  . Acute CVA (cerebrovascular accident) ([place]) 09/18/2017  . 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 Diabetes mellitus without complication ([place]) 58/10/9831  . Closed head injury 04/22/2013  . HLD (hyperlipidemia) 07/29/2009  . Essential hypertension 07/29/2009  . EFFUSION, PLEURAL 07/29/2009  . DIVERTICULITIS, COLON 07/29/2009  . 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 COLONIC POLYPS, HYPERPLASTIC, HX OF 07/29/2009   Chassity Felts, PTA  Chassity A Felts 12/23/2017, 11:19 AM  [facility] [address] [place], [place], [address] Phone: [phone]   Fax:  [phone]  Name: Justin [name] MRN: [id number] Date of Birth: October 20, 1940

## 2017-12-23 NOTE — Therapy (Signed)
[facility] [address] [place], [place], [address] Phone: [phone]   Fax:  [phone]  Speech Language Pathology Treatment  Patient Details  Name: Justin [name] MRN: [id number] Date of Birth: 03-May-1940 Referring Provider (SLP): Aura Dials, [place]   Encounter Date: 12/23/2017  End of Session - 12/23/17 1211    Visit Number  3    Number of Visits  17    Date for SLP Re-Evaluation  03/15/18    SLP Start Time  1016    SLP Stop Time   1058    SLP Time Calculation (min)  42 min    Activity Tolerance  Patient tolerated treatment well       Past Medical History:  Diagnosis Date  . Brain bleed ([place])   . Cancer [facility])    prostate  . Diabetes mellitus without complication ([place])   . High cholesterol   . Hypertension     Past Surgical History:  Procedure Laterality Date  . arm & hand surgery Left 1972   skill saw accident  . COLON SURGERY     partial  . EYE SURGERY     lens implant and cateract- Left  . PROSTATECTOMY      There were no vitals filed for this visit.  Subjective Assessment - 12/23/17 1007    Subjective  "I can't remember what"     Currently in Pain?  No/denies            ADULT SLP TREATMENT - 12/23/17 1203      General Information   Behavior/Cognition  Alert;Cooperative;Pleasant mood      Treatment Provided   Treatment provided  Cognitive-Linquistic      Cognitive-Linquistic Treatment   Treatment focused on  Cognition    Skilled Treatment  Pt forgetting his am meds, forgetting to check his blood sugar in the am and has been inconsistent with prior HHPT exercises. Trained pt and spouse in use of notebook for ST/PT , using a daily check list for PT and ST exercises daily. Pt, spouse and ST generated environmental compensations of keeping med box out on the counter where pt gets his breakfast and putting blood sugar log in sight on the fridge. Also instructed pt  to use a calendar  to cross off the days and put in all appts, lunch/dinner dates, birthdays etc. Spouse is aware she will have to assist with this.       Assessment / Recommendations / Plan   Plan  Continue with current plan of care      Progression Toward Goals   Progression toward goals  Progressing toward goals       SLP Education - 12/23/17 1207    Education Details  daily cognitive activities; memory notebook; external aids for med management, blood sugar and daily schedule    Person(s) Educated  Patient    Methods  Explanation    Comprehension  Verbalized understanding;Need further instruction;Verbal cues required       SLP Short Term Goals - 12/23/17 1211      SLP SHORT TERM GOAL #1   Title  Complete objective reading comprehension assessment within first 3 ST therapy sessions     Time  4    Period  Weeks    Status  On-going      SLP SHORT TERM GOAL #2   Title  Pt will tell SLP 2 deficits over 3 sessions    Time  4  Period  Weeks    Status  On-going      SLP SHORT TERM GOAL #3   Title  Pt will demo sustained attention with a simple cognitive linguistic therapy task for 20 minutes with occasional min A back to task over 3 sessions    Time  4    Period  Weeks    Status  On-going      SLP SHORT TERM GOAL #4   Title  Will establish memory system and will bring to therapy 3 sessions    Time  4    Period  Weeks    Status  On-going      SLP SHORT TERM GOAL #5   Title  pt will indicate understanding of written sentence level material 90% success over three sessions    Time  4    Period  Weeks    Status  New       SLP Long Term Goals - 12/23/17 1211      SLP LONG TERM GOAL #1   Title  Pt will demo emergent awareness with mod complex task by identifying 70% of errors over 3 sessions with min occasional A     Time  8    Period  Weeks    Status  On-going      SLP LONG TERM GOAL #2   Title  Pt will demo sustained/selective attention necessary for 40 minutes therapy tasks over 5  sessions     Time  8    Period  Weeks    Status  On-going      SLP LONG TERM GOAL #3   Title  Pt will demo adequate alternating attention between two mod complex cognitive tasks to achieve 80% success on each task over two sessions     Time  8    Period  Weeks    Status  On-going      SLP LONG TERM GOAL #4   Title  Pt will demo use of memory system outside of therapy over 4 therapy sessions per patient/spouse report    Time  8    Period  Weeks    Status  On-going      SLP LONG TERM GOAL #5   Title  Pt will demo understanding of functional written langugae (ariticles, multi-sentence texts, etc) with 100% sucess with copmensations    Time  8    Period  Weeks    Status  New       Plan - 12/23/17 1208    Clinical Impression Statement  Initiated external aids for medication management, HEP completion, and schedule management. Pt to bring in memory notebook with excercise check list when HEP for PT and cognitive activity for ST complete and witnessed by spouse. (Spouse stated concern that pt would put check even if he didn't complete the exercise. Instructed pt to bring back notebook to each session. Continue skilled ST to maximize cognition, langauge and carryover of compensations for cognitive impairments to improve pt safety and reduce caregiver burden    Speech Therapy Frequency  2x / week    Duration  --   8 weeks or 17 vists   Treatment/Interventions  Language facilitation;Environmental controls;SLP instruction and feedback;Compensatory techniques;Cognitive reorganization;Functional tasks;Patient/family education;Compensatory strategies;Multimodal communcation approach    Potential to Achieve Goals  Good    Potential Considerations  --   poor awareness of deficits   SLP Home Exercise Plan  daily cognitive activity;     Consulted and Agree  with Plan of Care  Patient;Family member/caregiver       Patient will benefit from skilled therapeutic intervention in order to improve the  following deficits and impairments:   Cognitive communication deficit    Problem List Patient Active Problem List   Diagnosis Date Noted  . Acute CVA (cerebrovascular accident) ([place]) 09/18/2017  . 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 Diabetes mellitus without complication ([place]) 47/01/5270  . Closed head injury 04/22/2013  . HLD (hyperlipidemia) 07/29/2009  . Essential hypertension 07/29/2009  . EFFUSION, PLEURAL 07/29/2009  . DIVERTICULITIS, COLON 07/29/2009  . 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 COLONIC POLYPS, HYPERPLASTIC, HX OF 07/29/2009    Carolynne Schuchard, Annye Rusk MS, CCC-SLP 12/23/2017, 12:12 PM  [facility] [address] [place], [place], [address] Phone: [phone]   Fax:  [phone]   Name: Justin [name] MRN: [id number] Date of Birth: 18-Feb-1941

## 2017-12-23 NOTE — Patient Instructions (Signed)
  3 Ring binder with sections for PT and ST - bring it back each session  Put you weekly check list in the binder - check off when you complete your exercises that Justin [name] has witnessed  Put blood sugar log on the fridge   Put calendar on the fridge after you have filled in other appointments, birthdays, dinners, lunches etc   Cognitive Activities you can do at home:   - [place] (easy level)  - [place] saw puzzles

## 2017-12-28 ENCOUNTER — Ambulatory Visit: Payer: Medicare HMO | Admitting: Physical Therapy

## 2017-12-28 ENCOUNTER — Ambulatory Visit: Payer: Medicare HMO

## 2017-12-30 ENCOUNTER — Ambulatory Visit: Payer: Medicare HMO | Admitting: Physical Therapy

## 2017-12-30 ENCOUNTER — Ambulatory Visit: Payer: Medicare HMO

## 2017-12-30 ENCOUNTER — Encounter: Payer: Self-pay | Admitting: Physical Therapy

## 2017-12-30 DIAGNOSIS — R2689 Other abnormalities of gait and mobility: Secondary | ICD-10-CM

## 2017-12-30 DIAGNOSIS — R41841 Cognitive communication deficit: Secondary | ICD-10-CM

## 2017-12-30 DIAGNOSIS — R2681 Unsteadiness on feet: Secondary | ICD-10-CM

## 2017-12-30 DIAGNOSIS — R4701 Aphasia: Secondary | ICD-10-CM

## 2017-12-30 DIAGNOSIS — M6281 Muscle weakness (generalized): Secondary | ICD-10-CM

## 2017-12-30 NOTE — Patient Instructions (Addendum)
  If your Metformin continues to give you diarrhea, contact your doctor and tell him. He may ask you to continue, as this side effects tends to resolve quickly in most patients.    Please complete the assigned speech therapy homework prior to your next session and return it to the speech therapist at your next visit.

## 2017-12-30 NOTE — Therapy (Signed)
[facility] [address] [place], [place], [address] Phone: [phone]   Fax:  [phone]  Speech Language Pathology Treatment  Patient Details  Name: Justin [name] MRN: [id number] Date of Birth: 10/23/1940 Referring Provider (SLP): Aura Dials, [place]   Encounter Date: 12/30/2017  End of Session - 12/30/17 1227    Visit Number  4  (Pended)     Number of Visits  69  (Pended)     Date for SLP Re-Evaluation  03/15/18  (Pended)     SLP Start Time  3  (Pended)        Past Medical History:  Diagnosis Date  . Brain bleed ([place])   . Cancer [facility])    prostate  . Diabetes mellitus without complication ([place])   . High cholesterol   . Hypertension     Past Surgical History:  Procedure Laterality Date  . arm & hand surgery Left 1972   skill saw accident  . COLON SURGERY     partial  . EYE SURGERY     lens implant and cateract- Left  . PROSTATECTOMY      There were no vitals filed for this visit.  Subjective Assessment - 12/30/17 1214    Subjective  "I didn't take it because sometimes it gives me diarrhea." (reason why pt didn't take metformin). Pt plans to take it later.    Currently in Pain?  No/denies            ADULT SLP TREATMENT - 12/30/17 1215      General Information   Behavior/Cognition  Alert;Cooperative;Pleasant mood      Treatment Provided   Treatment provided  Cognitive-Linquistic      Cognitive-Linquistic Treatment   Treatment focused on  Cognition    Skilled Treatment  Pt did not take his Metformin this AM due to reporting it gives him diarrhea. SLP asked pt why he needed to take it and he said he would take it when he returned home today. SLP told pt if he cont'd to have diarrhea with that med he should contact his MD and see if there is a med without that side effect. SLP shared with pt that side effect was reported as one that resolves as pt cont to take med. With simple calendar  and orientation tasks today pt req'd mod-max cues usually. Visual cues were of greatest assistance to pt. SLP shared with pt's wife re: metformin information in a handout and highlighted this information with wife. She stated "all he wants to do is watch TV." SLP cautioned against just watching TV at this time and encouraged more brain-exercising tasks. Pt stated, "Homework?!?" when SLP brought handouts in for pt to work on at home.       Assessment / Recommendations / Plan   Plan  Continue with current plan of care      Progression Toward Goals   Progression toward goals  Progressing toward goals       SLP Education - 12/30/17 1223    Education Details  MEtformin and side effects - diarrhea is one that resolves over time    Person(s) Educated  Patient    Methods  Explanation;Handout    Comprehension  Verbalized understanding;Need further instruction       SLP Short Term Goals - 12/30/17 1435      SLP SHORT TERM GOAL #1   Title  Complete objective reading comprehension assessment within first 3 ST therapy sessions  Time  3    Period  Weeks    Status  On-going      SLP SHORT TERM GOAL #2   Title  Pt will tell SLP 2 deficits over 3 sessions    Time  3    Period  Weeks    Status  On-going      SLP SHORT TERM GOAL #3   Title  Pt will demo sustained attention with a simple cognitive linguistic therapy task for 20 minutes with occasional min A back to task over 3 sessions    Time  3    Period  Weeks    Status  On-going      SLP SHORT TERM GOAL #4   Title  Will establish memory system and will bring to therapy 3 sessions    Time  3    Period  Weeks    Status  On-going      SLP SHORT TERM GOAL #5   Title  pt will indicate understanding of written sentence level material 90% success over three sessions    Time  3    Period  Weeks    Status  New       SLP Long Term Goals - 12/30/17 1436      SLP LONG TERM GOAL #1   Title  Pt will demo emergent awareness with mod  complex task by identifying 70% of errors over 3 sessions with min occasional A     Time  7    Period  Weeks    Status  On-going      SLP LONG TERM GOAL #2   Title  Pt will demo sustained/selective attention necessary for 40 minutes therapy tasks over 5 sessions     Time  7    Period  Weeks    Status  On-going      SLP LONG TERM GOAL #3   Title  Pt will demo adequate alternating attention between two mod complex cognitive tasks to achieve 80% success on each task over two sessions     Time  7    Period  Weeks    Status  On-going      SLP LONG TERM GOAL #4   Title  Pt will demo use of memory system outside of therapy over 4 therapy sessions per patient/spouse report    Time  7    Period  Weeks    Status  On-going      SLP LONG TERM GOAL #5   Title  Pt will demo understanding of functional written langugae (ariticles, multi-sentence texts, etc) with 100% sucess with copmensations    Time  7    Period  Weeks    Status  New       Plan - 12/30/17 1433    Clinical Impression Statement  Pt did not have notebook for today's session, so he did not bring excercise check list for PT and  ST. Pt's PT said pt told her he did not do any exercises at home. Continue skilled ST to maximize cognition, langauge and carryover of compensations for cognitive impairments to improve pt safety and reduce caregiver burden. If pt continues with low participation rate at home for therapy tasks, d/c may be considered.    Speech Therapy Frequency  2x / week    Duration  --   8 weeks or 17 vists   Treatment/Interventions  Language facilitation;Environmental controls;SLP instruction and feedback;Compensatory techniques;Cognitive reorganization;Functional tasks;Patient/family education;Compensatory strategies;Multimodal communcation approach  Potential to Achieve Goals  Good    Potential Considerations  --   poor awareness of deficits   SLP Home Exercise Plan  daily cognitive activity;     Consulted and  Agree with Plan of Care  Patient;Family member/caregiver       Patient will benefit from skilled therapeutic intervention in order to improve the following deficits and impairments:   Cognitive communication deficit  Aphasia    Problem List Patient Active Problem List   Diagnosis Date Noted  . Acute CVA (cerebrovascular accident) ([place]) 09/18/2017  . 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 Diabetes mellitus without complication ([place]) 48/02/6551  . Closed head injury 04/22/2013  . HLD (hyperlipidemia) 07/29/2009  . Essential hypertension 07/29/2009  . EFFUSION, PLEURAL 07/29/2009  . DIVERTICULITIS, COLON 07/29/2009  . 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 COLONIC POLYPS, HYPERPLASTIC, HX OF 07/29/2009    [facility] ,MS, CCC-SLP  12/30/2017, 2:37 PM  [facility] [address] [place], [place], [address] Phone: [phone]   Fax:  [phone]   Name: Justin [name] MRN: [id number] Date of Birth: Sep 21, 1940

## 2017-12-30 NOTE — Therapy (Signed)
[facility] [address] [place], [place], [address] Phone: [phone]   Fax:  [phone]  Physical Therapy Treatment  Patient Details  Name: Justin [name] MRN: [id number] Date of Birth: [birth year] Referring Provider (PT): Roe Coombs   Encounter Date: 12/30/2017  PT End of Session - 12/30/17 1100    Visit Number  4    Number of Visits  18    Date for PT Re-Evaluation  03/15/18    Authorization Type  [employer] Medicare-will need 10th visit progress note    Authorization Time Period  Medicare cert period:  38/18/29-9/37/16    PT Start Time  1057    PT Stop Time  1140    PT Time Calculation (min)  43 min    Equipment Utilized During Treatment  --   min guard throughout for safety   Activity Tolerance  Patient tolerated treatment well    Behavior During Therapy  [facility] for tasks assessed/performed       Past Medical History:  Diagnosis Date  . Brain bleed ([place])   . Cancer [facility])    prostate  . Diabetes mellitus without complication ([place])   . High cholesterol   . Hypertension     Past Surgical History:  Procedure Laterality Date  . arm & hand surgery Left 1972   skill saw accident  . COLON SURGERY     partial  . EYE SURGERY     lens implant and cateract- Left  . PROSTATECTOMY      There were no vitals filed for this visit.  Subjective Assessment - 12/30/17 1059    Subjective  feeling well; no falls; limited compliance with HEP    Patient is accompained by:  Family member   wife - stayed in waiting room   Patient Stated Goals  Pt's goal for therapy is to be able to work around the house more (with less fatigue).  To improve walking and stop dragging R foot.    Currently in Pain?  No/denies    Multiple Pain Sites  No                            Balance Exercises - 12/30/17 1302      Balance Exercises: Standing   Standing Eyes Opened  Narrow base of support (BOS);Head turns;2  reps;30 secs   horizontal and vertical   Standing Eyes Closed  Narrow base of support (BOS);2 reps;30 secs    Step Ups  Forward;Lateral;6 inch;UE support 1;UE support 2   forward and lateral with AirEx on ground    Tandem Gait  Forward;Upper extremity support   6 trials at counter   Retro Gait  --   6 trials at counter - 1 UE support   Sidestepping  --   6 trials at counter  -UE support   Other Standing Exercises  mini squat at counter 2 x 10; Standing on blue disc with 3 forward cone taps x 5 each LE;           PT Short Term Goals - 12/15/17 1159      PT SHORT TERM GOAL #1   Title  Pt will perform HEP with wife's supervision for improved strength, balance, gait.  TARGET 01/20/18    Time  5    Period  Weeks    Status  New    Target Date  01/20/18      PT SHORT  TERM GOAL #2   Title  Pt will improve 5x sit<>stand to less than or equal to 18 seconds for improved transfer efficiency and safety.    Time  5    Period  Weeks    Status  New    Target Date  01/20/18      PT SHORT TERM GOAL #3   Title  Pt will improve TUG score to less than or equal to 13.5 seconds for decreased fall risk.    Time  5    Period  Weeks    Status  New    Target Date  01/20/18      PT SHORT TERM GOAL #4   Title  Pt will improve DGI score to at least 18/24 for decreased fall risk.    Time  5    Period  Weeks    Status  New    Target Date  01/20/18      PT SHORT TERM GOAL #5   Title  Pt/wife will verbalize understanding of fall prevention in home environment.    Time  5    Period  Weeks    Status  New    Target Date  01/20/18        PT Long Term Goals - 12/15/17 1203      PT LONG TERM GOAL #1   Title  Pt will be independent with progression of HEP for improved strength, balance, gait.  TARGET 02/17/18    Time  9    Period  Weeks    Status  New    Target Date  02/17/18      PT LONG TERM GOAL #2   Title  Pt will improve gait velocity to at least 2.62 ft/sec for decreased fall risk,  improved gait efficiency.    Time  9    Period  Weeks    Status  New    Target Date  02/17/18      PT LONG TERM GOAL #3   Title  Pt will improve DGI 20/24 for decreased fall risk.    Time  9    Period  Weeks    Status  New    Target Date  02/17/18      PT LONG TERM GOAL #4   Title  Pt will ambulate at least 1000 ft, indoors and outdoor surfaces, independently for improved outdoor gait efficiency and safety.    Time  9    Period  Weeks    Status  New    Target Date  02/17/18      PT LONG TERM GOAL #5   Title  Pt will perform floor>stand transfer with UE support, modified independently, for improved fall recovery.    Time  9    Period  Weeks    Status  New    Target Date  02/17/18            Plan - 12/30/17 1300    Clinical Impression Statement  Patient reporting limited compliance with HEP thus far - emphasis on need fr compliance for full benefit and carryover. Session spent on balance activities with use of 1 UE counter and min guard from PT throughout for safety. Did not update HEP this visit as patient has not demonstrated ability to perform what has already been given on a consistent basis. Making good progress.     Rehab Potential  Good    PT Frequency  2x / week    PT  Duration  Other (comment)    PT Treatment/Interventions  ADLs/Self Care Home Management;DME Instruction;Therapeutic exercise;Balance training;Therapeutic activities;Functional mobility training;Stair training;Gait training;Neuromuscular re-education;Patient/family education    PT Next Visit Plan  Continue R hip strengthening, RLE strengthening, standing balance/gait activities); gait for improved R foot clearance/heelstrike, gait with obstacles/hurdles/compliant surfaces.     PT Home Exercise Plan  Access Code: [email or web address]     Consulted and Agree with Plan of Care  Patient       Patient will benefit from skilled therapeutic intervention in order to improve the following deficits and impairments:   Abnormal gait, Decreased activity tolerance, Decreased balance, Decreased coordination, Decreased mobility, Decreased strength, Difficulty walking  Visit Diagnosis: Other abnormalities of gait and mobility  Muscle weakness (generalized)  Unsteadiness on feet     Problem List Patient Active Problem List   Diagnosis Date Noted  . Acute CVA (cerebrovascular accident) ([place]) 09/18/2017  . 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 Diabetes mellitus without complication ([place]) 37/16/9678  . Closed head injury 04/22/2013  . HLD (hyperlipidemia) 07/29/2009  . Essential hypertension 07/29/2009  . EFFUSION, PLEURAL 07/29/2009  . DIVERTICULITIS, COLON 07/29/2009  . 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 COLONIC POLYPS, HYPERPLASTIC, HX OF 07/29/2009     Lanney Gins, PT, DPT Supplemental Physical Therapist 12/30/17 1:10 PM Pager: [phone] Office: [facility] [address] [place], [place], [address] Phone: [phone]   Fax:  [phone]  Name: SHEDRICK SARLI MRN: [id number] Date of Birth: 1941-02-01

## 2018-01-03 ENCOUNTER — Ambulatory Visit: Payer: Medicare HMO | Admitting: Speech Pathology

## 2018-01-03 ENCOUNTER — Telehealth: Payer: Self-pay | Admitting: Adult Health

## 2018-01-03 ENCOUNTER — Ambulatory Visit: Payer: Medicare HMO

## 2018-01-03 ENCOUNTER — Encounter: Payer: Self-pay | Admitting: Speech Pathology

## 2018-01-03 DIAGNOSIS — R2689 Other abnormalities of gait and mobility: Secondary | ICD-10-CM | POA: Diagnosis not present

## 2018-01-03 DIAGNOSIS — M6281 Muscle weakness (generalized): Secondary | ICD-10-CM

## 2018-01-03 DIAGNOSIS — R2681 Unsteadiness on feet: Secondary | ICD-10-CM

## 2018-01-03 DIAGNOSIS — R4701 Aphasia: Secondary | ICD-10-CM

## 2018-01-03 DIAGNOSIS — R41841 Cognitive communication deficit: Secondary | ICD-10-CM

## 2018-01-03 NOTE — Telephone Encounter (Signed)
Noted! Thank you

## 2018-01-03 NOTE — Telephone Encounter (Signed)
Pt.'s wife came in wanting to inform you before pt's appt. on 11/13 that pt has been very verbally violent, refuses to take his meds and will not do his exercises. All he wants to do is sit in front of the T.V. Pt.s wife would also like to be advised on how to deal with these incidents. Thank you.

## 2018-01-03 NOTE — Therapy (Signed)
[facility] [address] [place], [place], [address] Phone: [phone]   Fax:  [phone]  Speech Language Pathology Treatment  Patient Details  Name: RAYMIR FROMMELT MRN: [id number] Date of Birth: [birth year] Referring Provider (SLP): Aura Dials, [place]   Encounter Date: 01/03/2018  End of Session - 01/03/18 1210    Visit Number  4    Number of Visits  17    Date for SLP Re-Evaluation  03/15/18    SLP Start Time  1104    SLP Stop Time   1145    SLP Time Calculation (min)  41 min    Activity Tolerance  Patient tolerated treatment well       Past Medical History:  Diagnosis Date  . Brain bleed ([place])   . Cancer [facility])    prostate  . Diabetes mellitus without complication ([place])   . High cholesterol   . Hypertension     Past Surgical History:  Procedure Laterality Date  . arm & hand surgery Left 1972   skill saw accident  . COLON SURGERY     partial  . EYE SURGERY     lens implant and cateract- Left  . PROSTATECTOMY      There were no vitals filed for this visit.  Subjective Assessment - 01/03/18 1158    Subjective  "I walked out without it" re: [facility]            ADULT SLP TREATMENT - 01/03/18 1134      General Information   Behavior/Cognition  Alert;Cooperative;Pleasant mood      Treatment Provided   Treatment provided  Cognitive-Linquistic      Pain Assessment   Pain Assessment  No/denies pain      Cognitive-Linquistic Treatment   Treatment focused on  Cognition;Aphasia    Skilled Treatment  Pt arrives alone today. Hereports he is taking meds with pill organizer and using calendar , crossing off days for orientation, schedule and to cross check for correct day on pill organizer, with supervision of family. Functional word to simple sentence reading tasks with required usual moderate verbal cues, cueing pt to read aloud for auditory input. Pt forgot his therpay notebook - Working memory and  selective attention tasks required frequent verbal and visual cues. Pt benefitted from memory strategy of repetition for simple working memory tasks.       Assessment / Recommendations / Plan   Plan  Continue with current plan of care      Progression Toward Goals   Progression toward goals  Progressing toward goals       SLP Education - 01/03/18 1208    Education Details  reading aloud does not mean you are understanding what you are reading    Person(s) Educated  Patient    Methods  Explanation    Comprehension  Verbalized understanding;Need further instruction       SLP Short Term Goals - 01/03/18 1209      SLP SHORT TERM GOAL #1   Title  Complete objective reading comprehension assessment within first 3 ST therapy sessions     Time  2    Period  Weeks    Status  Achieved      SLP SHORT TERM GOAL #2   Title  Pt will tell SLP 2 deficits over 3 sessions    Time  2    Period  Weeks    Status  On-going      SLP  SHORT TERM GOAL #3   Title  Pt will demo sustained attention with a simple cognitive linguistic therapy task for 20 minutes with occasional min A back to task over 3 sessions    Time  2    Period  Weeks    Status  On-going      SLP SHORT TERM GOAL #4   Title  Will establish memory system and will bring to therapy 3 sessions    Time  2    Period  Weeks    Status  On-going      SLP SHORT TERM GOAL #5   Title  pt will indicate understanding of written sentence level material 90% success over three sessions    Time  2    Period  Weeks    Status  On-going       SLP Long Term Goals - 01/03/18 1210      SLP LONG TERM GOAL #1   Title  Pt will demo emergent awareness with mod complex task by identifying 70% of errors over 3 sessions with min occasional A     Time  6    Period  Weeks    Status  On-going      SLP LONG TERM GOAL #2   Title  Pt will demo sustained/selective attention necessary for 40 minutes therapy tasks over 5 sessions     Time  6    Period   Weeks    Status  On-going      SLP LONG TERM GOAL #3   Title  Pt will demo adequate alternating attention between two mod complex cognitive tasks to achieve 80% success on each task over two sessions     Time  6    Period  Weeks    Status  On-going      SLP LONG TERM GOAL #4   Title  Pt will demo use of memory system outside of therapy over 4 therapy sessions per patient/spouse report    Time  6    Period  Weeks    Status  On-going      SLP LONG TERM GOAL #5   Title  Pt will demo understanding of functional written langugae (ariticles, multi-sentence texts, etc) with 100% sucess with copmensations    Time  6    Period  Weeks    Status  On-going       Plan - 01/03/18 1209    Clinical Impression Statement  Pt did not have notebook for today's session, so he did not bring excercise check list for PT and  ST. Pt's PT said pt told her he did not do any exercises at home. Continue skilled ST to maximize cognition, langauge and carryover of compensations for cognitive impairments to improve pt safety and reduce caregiver burden. If pt continues with low participation rate at home for therapy tasks, d/c may be considered.    Speech Therapy Frequency  2x / week    Treatment/Interventions  Language facilitation;Environmental controls;SLP instruction and feedback;Compensatory techniques;Cognitive reorganization;Functional tasks;Patient/family education;Compensatory strategies;Multimodal communcation approach    Potential to Achieve Goals  Good    SLP Home Exercise Plan  daily cognitive activity;     Consulted and Agree with Plan of Care  Patient;Family member/caregiver       Patient will benefit from skilled therapeutic intervention in order to improve the following deficits and impairments:   Cognitive communication deficit  Aphasia    Problem List Patient Active Problem List  Diagnosis Date Noted  . Acute CVA (cerebrovascular accident) ([place]) 09/18/2017  . 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 Diabetes mellitus without complication ([place]) 34/96/1164  . Closed head injury 04/22/2013  . HLD (hyperlipidemia) 07/29/2009  . Essential hypertension 07/29/2009  . EFFUSION, PLEURAL 07/29/2009  . DIVERTICULITIS, COLON 07/29/2009  . 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 COLONIC POLYPS, HYPERPLASTIC, HX OF 07/29/2009    Marsela Kuan, Annye Rusk MS, CCC-SLP 01/03/2018, 12:11 PM  [facility] [address] [place], [place], [address] Phone: [phone]   Fax:  [phone]   Name: DONZEL ROMACK MRN: [id number] Date of Birth: 1940-09-17

## 2018-01-03 NOTE — Therapy (Cosign Needed)
This entire session was performed under direct supervision and direction of a licensed Brewing technologist . The therapist was present and actively participating. I have personally read, edited and approve of the note as written. Chassity Felts, PTA  [facility] [address] [place], [place], [address] Phone: [phone]   Fax:  [phone]  Physical Therapy Treatment  Patient Details  Name: Justin [name] MRN: [id number] Date of Birth: 22-Dec-1940 Referring Provider (PT): Roe Coombs   Encounter Date: 01/03/2018  PT End of Session - 01/03/18 1119    Visit Number  5    Number of Visits  18    Date for PT Re-Evaluation  03/15/18    Authorization Type  [employer] Medicare-will need 10th visit progress note    Authorization Time Period  Medicare cert period:  16/38/46-6/59/93    PT Start Time  1015    PT Stop Time  1057    PT Time Calculation (min)  42 min    Equipment Utilized During Treatment  Gait belt   min guard throughout for safety   Activity Tolerance  Patient tolerated treatment well    Behavior During Therapy  [facility] for tasks assessed/performed       Past Medical History:  Diagnosis Date  . Brain bleed ([place])   . Cancer [facility])    prostate  . Diabetes mellitus without complication ([place])   . High cholesterol   . Hypertension     Past Surgical History:  Procedure Laterality Date  . arm & hand surgery Left 1972   skill saw accident  . COLON SURGERY     partial  . EYE SURGERY     lens implant and cateract- Left  . PROSTATECTOMY      There were no vitals filed for this visit.  Subjective Assessment - 01/03/18 1018    Subjective  No falls or near falls to report. Pt states he is doing his home exercises twice a day.    Patient is accompained by:  Family member    Patient Stated Goals  Pt's goal for therapy is to be able to work around the house more (with less fatigue).  To improve walking and stop  dragging R foot.    Currently in Pain?  No/denies          [facility] Adult PT Treatment/Exercise - 01/03/18 1058      Ambulation/Gait   Ambulation/Gait  Yes    Ambulation/Gait Assistance  5: Supervision    Ambulation/Gait Assistance Details  Pt ambulates around gym scanning for playing cards and reaching to retrieve with supervision and no LOB noted.      Ambulation Distance (Feet)  500 Feet    Assistive device  None    Gait Pattern  Step-through pattern;Decreased step length - right;Decreased step length - left;Decreased stride length;Poor foot clearance - right    Ambulation Surface  Level;Indoor      High Level Balance   High Level Balance Activities  Side stepping;Marching forwards;Backward walking;Negotitating around obstacles;Negotiating over obstacles    High Level Balance Comments  At counter: pt walks foward on blue mat transitioning to stepping over two small orange hurdles with occasional circumduction of leading foot; marching foward to walking backwards on blue mat; side stepping on blue mat with verbal cues to increase step length; negotiating foward and sideways over small hurdles on blue mat with intermittent UE support and verbal cue to increase foot clearance; toe taps to [place] with  verbal cues to decrease weight bearing on platform; figure 8 around cones in hallway with verbal cues to increase step length. Pt required supervision with all balance exercises.         Knee/Hip Exercises: Standing   Other Standing Knee Exercises  At counter: mini squats with supervision and verbla cues and demo for proper technique.         PT Short Term Goals - 12/15/17 1159      PT SHORT TERM GOAL #1   Title  Pt will perform HEP with wife's supervision for improved strength, balance, gait.  TARGET 01/20/18    Time  5    Period  Weeks    Status  New    Target Date  01/20/18      PT SHORT TERM GOAL #2   Title  Pt will improve 5x sit<>stand to less than or equal to 18 seconds  for improved transfer efficiency and safety.    Time  5    Period  Weeks    Status  New    Target Date  01/20/18      PT SHORT TERM GOAL #3   Title  Pt will improve TUG score to less than or equal to 13.5 seconds for decreased fall risk.    Time  5    Period  Weeks    Status  New    Target Date  01/20/18      PT SHORT TERM GOAL #4   Title  Pt will improve DGI score to at least 18/24 for decreased fall risk.    Time  5    Period  Weeks    Status  New    Target Date  01/20/18      PT SHORT TERM GOAL #5   Title  Pt/wife will verbalize understanding of fall prevention in home environment.    Time  5    Period  Weeks    Status  New    Target Date  01/20/18        PT Long Term Goals - 12/15/17 1203      PT LONG TERM GOAL #1   Title  Pt will be independent with progression of HEP for improved strength, balance, gait.  TARGET 02/17/18    Time  9    Period  Weeks    Status  New    Target Date  02/17/18      PT LONG TERM GOAL #2   Title  Pt will improve gait velocity to at least 2.62 ft/sec for decreased fall risk, improved gait efficiency.    Time  9    Period  Weeks    Status  New    Target Date  02/17/18      PT LONG TERM GOAL #3   Title  Pt will improve DGI 20/24 for decreased fall risk.    Time  9    Period  Weeks    Status  New    Target Date  02/17/18      PT LONG TERM GOAL #4   Title  Pt will ambulate at least 1000 ft, indoors and outdoor surfaces, independently for improved outdoor gait efficiency and safety.    Time  9    Period  Weeks    Status  New    Target Date  02/17/18      PT LONG TERM GOAL #5   Title  Pt will perform floor>stand transfer with UE support, modified independently,  for improved fall recovery.    Time  9    Period  Weeks    Status  New    Target Date  02/17/18            Plan - 01/03/18 1119    Clinical Impression Statement  Today's session focused on gait training with activity and dynamic balance exercises emphasizing  negotiation around and over obstacles and stabilization on compliant surfaces. Pt requires supervision with all balance exercises with intermitent UE support and occcasional circumduction of LE to clear obstacles. Pt would benefit from further PT to continue progressing toward esablished goals.     Rehab Potential  Good    PT Frequency  2x / week    PT Duration  Other (comment)    PT Treatment/Interventions  ADLs/Self Care Home Management;DME Instruction;Therapeutic exercise;Balance training;Therapeutic activities;Functional mobility training;Stair training;Gait training;Neuromuscular re-education;Patient/family education    PT Next Visit Plan  Continue R hip strengthening, RLE strengthening, standing balance/gait activities); gait for improved R foot clearance/heelstrike, gait with obstacles/hurdles/compliant surfaces.     PT Home Exercise Plan  Access Code: [email or web address]     Consulted and Agree with Plan of Care  Patient       Patient will benefit from skilled therapeutic intervention in order to improve the following deficits and impairments:  Abnormal gait, Decreased activity tolerance, Decreased balance, Decreased coordination, Decreased mobility, Decreased strength, Difficulty walking  Visit Diagnosis: Other abnormalities of gait and mobility  Muscle weakness (generalized)  Unsteadiness on feet     Problem List Patient Active Problem List   Diagnosis Date Noted  . Acute CVA (cerebrovascular accident) ([place]) 09/18/2017  . 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 Diabetes mellitus without complication ([place]) 76/19/5093  . Closed head injury 04/22/2013  . HLD (hyperlipidemia) 07/29/2009  . Essential hypertension 07/29/2009  . EFFUSION, PLEURAL 07/29/2009  . DIVERTICULITIS, COLON 07/29/2009  . 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 COLONIC POLYPS, HYPERPLASTIC, HX OF 07/29/2009   Chassity Felts, PTA  Cecile Sheerer, SPTA 01/03/2018, 11:30 AM  [facility] [address] [place], [place], [address] Phone: [phone]   Fax:  [phone]  Name: ANDREU DRUDGE MRN: [id number] Date of Birth: 1940/11/25

## 2018-01-04 ENCOUNTER — Encounter: Payer: Self-pay | Admitting: Adult Health

## 2018-01-04 ENCOUNTER — Ambulatory Visit (INDEPENDENT_AMBULATORY_CARE_PROVIDER_SITE_OTHER): Payer: Medicare HMO | Admitting: Adult Health

## 2018-01-04 VITALS — BP 135/83 | HR 74 | Ht 70.0 in | Wt 226.0 lb

## 2018-01-04 DIAGNOSIS — I63512 Cerebral infarction due to unspecified occlusion or stenosis of left middle cerebral artery: Secondary | ICD-10-CM

## 2018-01-04 DIAGNOSIS — E785 Hyperlipidemia, unspecified: Secondary | ICD-10-CM

## 2018-01-04 DIAGNOSIS — I10 Essential (primary) hypertension: Secondary | ICD-10-CM

## 2018-01-04 DIAGNOSIS — I69319 Unspecified symptoms and signs involving cognitive functions following cerebral infarction: Secondary | ICD-10-CM

## 2018-01-04 DIAGNOSIS — E119 Type 2 diabetes mellitus without complications: Secondary | ICD-10-CM

## 2018-01-04 MED ORDER — CITALOPRAM HYDROBROMIDE 10 MG PO TABS: 10.0000 mg | ORAL_TABLET | Freq: Every day | ORAL | 3 refills | Status: DC

## 2018-01-04 NOTE — Patient Instructions (Signed)
Continue clopidogrel 75 mg daily  and Lipitor  for secondary stroke prevention  Stop aspirin at this time and continue plavix only  Continue to follow up with PCP regarding cholesterol, blood pressure and diabetes management   Start Celexa 10 mg due to possible depression and anxiety - please give this about 4-6 weeks to take full affect. After this time, if we need to adjust dosage or change medication, we can talk about this. You can also continue to follow up with your primary provider if you would like regarding depression/anxiety   Schedule appointment today to undergo sleep apnea testing in our clinic  Continue therapies for continued stroke deficits  Continue to monitor blood pressure at home  Maintain strict control of hypertension with blood pressure goal below 130/90, diabetes with hemoglobin A1c goal below 6.5% and cholesterol with LDL cholesterol (bad cholesterol) goal below 70 mg/dL. I also advised the patient to eat a healthy diet with plenty of whole grains, cereals, fruits and vegetables, exercise regularly and maintain ideal body weight.  Followup in the future with me in 3 months or call earlier if needed       Thank you for coming to see [place] at [facility]. I hope we have been able to provide you high quality care today.  You may receive a patient satisfaction survey over the next few weeks. We would appreciate your feedback and comments so that we may continue to improve ourselves and the health of our patients.

## 2018-01-04 NOTE — Progress Notes (Signed)
[facility] [address] [place]. [place] [address] [phone]       OFFICE FOLLOW UP NOTE  Justin [name] Date of Birth:  September 11, 1940 Medical Record Number:  [id number]   Reason for Referral:  hospital stroke follow up  CHIEF COMPLAINT:  Chief Complaint  Patient presents with  . Follow-up    [facility] follow up room 9 pt with wife Justin [name] pt has concerns about pt not taking meds regulary refuse to do anything     HPI: Justin [name] is being seen today for initial visit in the office for left quadrant and BG infarct most likely related to large and small vessel arthrosclerosis including left M1 moderate stenosis on 09/18/2017. History obtained from patient, wife and chart review. Reviewed all radiology images and labs personally.  Justin [name] is a 77 y.o. male with history of hypertension, hyperlipidemia, diabetes mellitus, prostate cancer, and previous cerebral hemorrhage who presented with right-sided weakness. He did not receive IV t-PA due to late presentation.  CT head reviewed and showed hypodensity within the left BG consistent with age-indeterminate infarct.  MRI head reviewed and showed acute/subacute nonhemorrhagic infarct involving the anterior left basal ganglia.  MRA head showed high-grade stenosis of the proximal left P1 segment with a prominent posterior communicating artery.  Carotid Doppler showed bilateral ICA stenosis of 1 to 39%.  2D echo showed an EF of 60 to 65% without cardiac source of embolus.  LDL 115 and recommend increase of Lipitor 40 mg daily to Lipitor 80 mg daily.  A1c 7.7 and recommended tight glycemic control with close PCP follow-up.  HTN stable during admission and recommended long-term BP goal normotensive range.  Patient was on aspirin 81 mg PTA and recommended aspirin 325 mg and Plavix for 3 months and then Plavix alone due to large and small vessel atherosclerosis including left M1 moderate stenosis.  Patient was discharged home with  recommendations of home health PT.  10/20/2017 visit: Patient is being seen today for hospital follow-up and is accompanied by his wife.  He no longer has right-sided deficits and they have completely resolved but still has complaints of cognitive issues.  He does continue to work with home PT/OT/ST.  He continues taking both aspirin and Plavix without side effects of bleeding or bruising.  Continues to take Lipitor without side effects myalgias.  Blood pressure today 142/83 but per patient typically 120s/80s.  He also monitors glucose levels at home which have been stable.  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  He denies doing additional activities such as mind exercises or continued speech therapies for continued cognitive concerns.  Wife states that he mainly sits and watches TV most of the day.  Highly encouraged and educated patient on importance of mind exercises and continued activity for improved cognition and prevent deconditioning with possible worsening right-sided weakness.  It was discussed that patient does snore at night and does experience daytime fatigue where it is easy for him to nap during the day despite adequate sleep at night prior. Denies new or worsening stroke/TIA symptoms.  Interval history 01/04/2018: Patient is being seen today for follow-up visit and is accompanied by his wife.  He continues to participate in speech therapy and physical therapy at our neuro rehab clinic.  He continues to have cognitive deficits which have been stable per wife.  Wife is concerned as he has decreased  energy and lack of motivation to participate in activities.  She states that he typically will sit and watch TV during the day and does exhibit short temperament with her and irritability.  He will be undergoing neurocognitive testing on 01/12/2018.  Discussion resolving possible depression-like symptoms as patient states that he feels as though  he does not have the energy to be productive or to stay active during the day.  Patient denies any prior history of depression.  Patient was evaluated by [facility] sleep clinic Dr. Rexene [name] for possible OSA.  She did recommend sleep study but unfortunately multiple attempts have been made to schedule appointment and unable to contact them.  Patient states that if a number of calls him that he is unaware of, he will not answer phone call.  He is willing to schedule appointment for sleep study after today's appointment.  He has continued on both aspirin and Plavix despite recommendation of stopping after 12/19/2017 as at that time he completed 3 months DAPT therapy but despite continued DAPT denies any bleeding or bruising.  Continues to take atorvastatin 80 mg without side effects of myalgias.  Lipid panel obtained on 12/01/2017 with LDL 77.  Blood pressure today satisfactory 135/83.  No further concerns at this time.  Denies new or worsening stroke/TIA symptoms.     ROS:   14 system review of systems performed and negative with exception of memory loss and agitation  PMH:  Past Medical History:  Diagnosis Date  . Brain bleed ([place])   . Cancer [facility])    prostate  . Diabetes mellitus without complication ([place])   . High cholesterol   . Hypertension   . Stroke [facility])     PSH:  Past Surgical History:  Procedure Laterality Date  . arm & hand surgery Left 1972   skill saw accident  . COLON SURGERY     partial  . EYE SURGERY     lens implant and cateract- Left  . PROSTATECTOMY      Social History:  Social History   Socioeconomic History  . Marital status: Married    Spouse name: Not on file  . Number of children: 1  . Years of education: Not on file  . Highest education level: High school graduate  Occupational History  . Not on file  Social Needs  . Financial resource strain: Not on file  . Food insecurity:    Worry: Not on file    Inability: Not on file  . Transportation needs:     Medical: Not on file    Non-medical: Not on file  Tobacco Use  . Smoking status: Never Smoker  . Smokeless tobacco: Never Used  Substance and Sexual Activity  . Alcohol use: Never    Frequency: Never  . Drug use: Never  . Sexual activity: Not on file  Lifestyle  . Physical activity:    Days per week: Not on file    Minutes per session: Not on file  . Stress: Not on file  Relationships  . Social connections:    Talks on phone: Not on file    Gets together: Not on file    Attends religious service: Not on file    Active member of club or organization: Not on file    Attends meetings of clubs or organizations: Not on file    Relationship status: Not on file  . Intimate partner violence:    Fear of current or ex partner: Not on file  Emotionally abused: Not on file    Physically abused: Not on file    Forced sexual activity: Not on file  Other Topics Concern  . Not on file  Social History Narrative   Lives at home with his wife   Right handed   Caffeine: soft drinks 2-3 cups daily    Family History:  Family History  Problem Relation Age of Onset  . CAD Mother   . Diabetes Mother   . Emphysema Father   . Diabetes Sister   . Diabetes Brother   . Diabetes Sister   . Diabetes Brother   . Diabetes Mellitus II Neg Hx     Medications:   Current Outpatient Medications on File Prior to Visit  Medication Sig Dispense Refill  . atorvastatin (LIPITOR) 80 MG tablet Take 1 tablet (80 mg total) by mouth daily. 90 tablet 4  . Cholecalciferol (VITAMIN D-1000 MAX ST) 1000 units tablet Take 1,000 Units by mouth daily.    . clopidogrel (PLAVIX) 75 MG tablet Take 1 tablet (75 mg total) by mouth daily. 90 tablet 4  . cyanocobalamin ([employer] VITAMIN B12) 1000 MCG tablet Take 1,000 mcg by mouth daily.     . furosemide (LASIX) 20 MG tablet Take by mouth.    Marland Kitchen glipiZIDE (GLUCOTROL XL) 10 MG 24 hr tablet Take 10 mg by mouth daily with breakfast.    . hydrochlorothiazide (HYDRODIURIL) 25 MG  tablet Take 25 mg by mouth daily.     Marland Kitchen losartan (COZAAR) 100 MG tablet Take 100 mg by mouth daily.    . metFORMIN (GLUCOPHAGE-XR) 500 MG 24 hr tablet Take 1,000 mg by mouth 2 (two) times daily.     . polyvinyl alcohol (LIQUIFILM TEARS) 1.4 % ophthalmic solution Place 1 drop into both eyes 4 (four) times daily as needed for dry eyes.    . Semaglutide (OZEMPIC) 0.25 or 0.5 MG/DOSE SOPN Inject 0.25 mg into the skin once a week.    . vitamin B-12 (CYANOCOBALAMIN) 1000 MCG tablet Take by mouth.    . fenofibrate 160 MG tablet Take 160 mg by mouth daily.     No current facility-administered medications on file prior to visit.     Allergies:  No Known Allergies   Physical Exam  Vitals:   01/04/18 1032  BP: 135/83  Pulse: 74  Weight: 226 lb (102.5 kg)  Height: 5\' 10"  (1.778 m)   Body mass index is 32.43 kg/m. No exam data present  General: Obese pleasant elderly Caucasian male, seated, in no evident distress Head: head normocephalic and atraumatic.   Neck: supple with no carotid or supraclavicular bruits Cardiovascular: regular rate and rhythm, no murmurs Musculoskeletal: no deformity Skin:  no rash/petichiae Vascular:  Normal pulses all extremities  Neurologic Exam Mental Status: Awake and fully alert. Oriented to place and time. Remote memory intact. Attention span, concentration and fund of knowledge appropriate. Mood and affect appropriate during appointment. Cranial Nerves: Fundoscopic exam deferred. Pupils equal, briskly reactive to light. Extraocular movements full without nystagmus. Visual fields full to confrontation. Hearing intact. Facial sensation intact. Face, tongue, palate moves normally and symmetrically.  Motor: Normal bulk and tone. Normal strength in all tested extremity muscles. Sensory.: intact to touch , pinprick , position and vibratory sensation.  Coordination: Rapid alternating movements normal in all extremities. Finger-to-nose and heel-to-shin performed  accurately bilaterally. Gait and Station: Arises from chair without difficulty. Stance is normal. Gait demonstrates normal stride length and balance .  Reflexes: 1+ and symmetric. Toes  downgoing.       Diagnostic Data (Labs, Imaging, Testing)  Ct Head Wo Contrast 09/17/2017 IMPRESSION:  1. Hypodensity within the left basal ganglia consistent with age indeterminate infarct, new since 05/31/2013 head CT. Negative for hemorrhage or mass.  2. Atrophy with progression of white matter small vessel ischemic disease. 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   Mr Brain wo contrast Mr Virgel Paling Wo Contrast 09/18/2017 IMPRESSION:  1. Acute/subacute nonhemorrhagic infarct involving the anterior left basal ganglia measures 2.1 x 3.4 x 2.5 cm. Given the degree of atrophy, there is no significant associated mass effect.  2. Atrophy and diffuse white matter disease are moderately advanced for age. This is consistent with chronic microvascular ischemia.  3. Moderate proximal left M1 segment stenosis may be associated with the area of acute infarct as the origin of perforated arteries.  4. Moderate diffuse medium and small vessel disease as described.  5. 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  6. No emergent large vessel occlusion.    Transthoracic Echocardiogram  09/18/2017 Study Conclusions - Left ventricle: The cavity size was normal. Wall thickness was increased in a pattern of mild LVH. Systolic function was normal. The estimated ejection fraction was in the range of 60% to 65%. Wall motion was normal; there were no regional wall motion abnormalities. Doppler parameters are consistent with abnormal left ventricular relaxation (grade 1 diastolic dysfunction). The E/e&' ratio is <8, suggesting normal LV filling pressure. - Aortic valve: Sclerosis without stenosis. There was trivial regurgitation. - Mitral  valve: Calcified annulus. Mildly thickened leaflets . There was trivial regurgitation. - Left atrium: The atrium was normal in size. - Inferior vena cava: The vessel was normal in size. The respirophasic diameter changes were in the normal range (>= 50%), consistent with normal central venous pressure. Impressions: - LVEF 60-65%, mild LVH, normal wall motion, grade 1 DD, normal LV filling pressure, trivial AI, MR, normal LA size, normal IVC.   Bilateral Carotid Dopplers  09/18/2017 Preliminary report:1-39% ICA plaquing. Vertebral artery flow is antegrade.    ASSESSMENT: Justin [name] is a 77 y.o. 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 Vascular risk factors include DM, HTN, HLD and obesity.  Patient is being seen today for follow-up visit and does continue to have cognitive difficulties but have been stable and denies any new or worsening stroke/TIA symptoms.    PLAN: -Continue clopidogrel 75 mg daily  and lipitor 80mg   for secondary stroke prevention -Advised to stop aspirin at this time as greater than 38-month DAPT completed and continue on Plavix alone -F/u with PCP regarding your HLD, HTN and DM management -Appointment will be scheduled after today's appointment to undergo sleep study for possible OSA -Continue PT/ST for continued deficits along with undergoing neurocognitive testing on 01/12/2018 -Due to possible post stroke depression, Celexa 10 mg daily was started.  Patient and wife were advised that this may take approximately 4 to 6 weeks to take full effect.  After that time, if patient unable to tolerate or did not gain benefit on medication, we can consider increasing dose at that time -continue to monitor BP at home -advised to continue to stay active and maintain a healthy diet and promote weight loss -Maintain strict control of hypertension with blood pressure goal below 130/90,  diabetes with hemoglobin A1c goal below 6.5% and cholesterol with LDL cholesterol (bad cholesterol) goal  below 70 mg/dL. I also advised the patient to eat a healthy diet with plenty of whole grains, cereals, fruits and vegetables, exercise regularly and maintain ideal body weight.  Follow up in 3 months or call earlier if needed   Greater than 50% of time during this 25 minute visit was spent on counseling,explanation of diagnosis of left basal ganglia infarct, reviewing risk factor management of DM, HTN and HLD, planning of further management, discussion with patient and family and coordination of care    Venancio Poisson, AGNP-BC  [facility] [address] [place], [place] [address]  Phone [phone] Fax [phone] Note: This document was prepared with digital dictation and possible smart phrase technology. Any transcriptional errors that result from this process are unintentional.

## 2018-01-06 ENCOUNTER — Ambulatory Visit: Payer: Medicare HMO | Admitting: Physical Therapy

## 2018-01-06 ENCOUNTER — Encounter: Payer: Self-pay | Admitting: Physical Therapy

## 2018-01-06 ENCOUNTER — Ambulatory Visit: Payer: Medicare HMO

## 2018-01-06 DIAGNOSIS — M6281 Muscle weakness (generalized): Secondary | ICD-10-CM

## 2018-01-06 DIAGNOSIS — R2689 Other abnormalities of gait and mobility: Secondary | ICD-10-CM

## 2018-01-06 DIAGNOSIS — R41841 Cognitive communication deficit: Secondary | ICD-10-CM

## 2018-01-06 DIAGNOSIS — R2681 Unsteadiness on feet: Secondary | ICD-10-CM

## 2018-01-06 DIAGNOSIS — R4701 Aphasia: Secondary | ICD-10-CM

## 2018-01-06 NOTE — Therapy (Signed)
[name] [address] [place], [place], [address] Phone: [phone]   Fax:  [phone]  Physical Therapy Treatment  Patient Details  Name: Justin [name] MRN: [id number] Date of Birth: 1941/02/10 Referring Provider (PT): Roe Coombs   Encounter Date: 01/06/2018  PT End of Session - 01/06/18 1108    Visit Number  6    Number of Visits  18    Date for PT Re-Evaluation  03/15/18    Authorization Type  [employer] Medicare-will need 10th visit progress note    Authorization Time Period  Medicare cert period:  61/95/09-05/17/69    PT Start Time  1104    PT Stop Time  1144    PT Time Calculation (min)  40 min    Activity Tolerance  Patient tolerated treatment well    Behavior During Therapy  [facility] for tasks assessed/performed       Past Medical History:  Diagnosis Date  . Brain bleed ([place])   . Cancer [facility])    prostate  . Diabetes mellitus without complication ([place])   . High cholesterol   . Hypertension   . Stroke [facility])     Past Surgical History:  Procedure Laterality Date  . arm & hand surgery Left 1972   skill saw accident  . COLON SURGERY     partial  . EYE SURGERY     lens implant and cateract- Left  . PROSTATECTOMY      There were no vitals filed for this visit.  Subjective Assessment - 01/06/18 1107    Subjective  "nothing feels bad, nothing feels good" "today is a downhill day" - reports no falls or stumbles; unsure of HEP compliance    Patient Stated Goals  Pt's goal for therapy is to be able to work around the house more (with less fatigue).  To improve walking and stop dragging R foot.    Currently in Pain?  No/denies    Multiple Pain Sites  No                            Balance Exercises - 01/06/18 1300      Balance Exercises: Standing   Gait with Head Turns  Forward   1 lap around gym; slow speed   Tandem Gait  Forward;Foam/compliant surface;5 reps   length of red  and blue mat - no UE support; attempted retro   Retro Gait  Foam/compliant surface;5 reps   length of red and blue mat - no UE support   Sidestepping  Foam/compliant support;4 reps   length of red and blue mat - no UE support   Sit to Stand Time  STS from mat table with AirEx under feet - no UE support x 10 reps    Other Standing Exercises  karaoke gait length of red and blue mat with 1 UE support from PT and with max cueing for coordination; obstacle navigation with environment scanning for cone pickup x 12 cones - requires cueing for scanning          PT Short Term Goals - 12/15/17 1159      PT SHORT TERM GOAL #1   Title  Pt will perform HEP with wife's supervision for improved strength, balance, gait.  TARGET 01/20/18    Time  5    Period  Weeks    Status  New    Target Date  01/20/18  PT SHORT TERM GOAL #2   Title  Pt will improve 5x sit<>stand to less than or equal to 18 seconds for improved transfer efficiency and safety.    Time  5    Period  Weeks    Status  New    Target Date  01/20/18      PT SHORT TERM GOAL #3   Title  Pt will improve TUG score to less than or equal to 13.5 seconds for decreased fall risk.    Time  5    Period  Weeks    Status  New    Target Date  01/20/18      PT SHORT TERM GOAL #4   Title  Pt will improve DGI score to at least 18/24 for decreased fall risk.    Time  5    Period  Weeks    Status  New    Target Date  01/20/18      PT SHORT TERM GOAL #5   Title  Pt/wife will verbalize understanding of fall prevention in home environment.    Time  5    Period  Weeks    Status  New    Target Date  01/20/18        PT Long Term Goals - 12/15/17 1203      PT LONG TERM GOAL #1   Title  Pt will be independent with progression of HEP for improved strength, balance, gait.  TARGET 02/17/18    Time  9    Period  Weeks    Status  New    Target Date  02/17/18      PT LONG TERM GOAL #2   Title  Pt will improve gait velocity to at least  2.62 ft/sec for decreased fall risk, improved gait efficiency.    Time  9    Period  Weeks    Status  New    Target Date  02/17/18      PT LONG TERM GOAL #3   Title  Pt will improve DGI 20/24 for decreased fall risk.    Time  9    Period  Weeks    Status  New    Target Date  02/17/18      PT LONG TERM GOAL #4   Title  Pt will ambulate at least 1000 ft, indoors and outdoor surfaces, independently for improved outdoor gait efficiency and safety.    Time  9    Period  Weeks    Status  New    Target Date  02/17/18      PT LONG TERM GOAL #5   Title  Pt will perform floor>stand transfer with UE support, modified independently, for improved fall recovery.    Time  9    Period  Weeks    Status  New    Target Date  02/17/18            Plan - 01/06/18 1137    Clinical Impression Statement  Unsure of true compliance with HEP as family member does not come back into treatment with patinet. Session today continuing to focus on high level balance activities on compliant surfaces and with narrow BOS. Emphasis on scanning environment today for obstacle navigation and safety for hopeful carryover into the home and community environment.     Rehab Potential  Good    PT Frequency  2x / week    PT Duration  Other (comment)    PT Treatment/Interventions  ADLs/Self Care Home Management;DME Instruction;Therapeutic exercise;Balance training;Therapeutic activities;Functional mobility training;Stair training;Gait training;Neuromuscular re-education;Patient/family education    PT Next Visit Plan  Continue R hip strengthening, RLE strengthening, standing balance/gait activities); gait for improved R foot clearance/heelstrike, gait with obstacles/hurdles/compliant surfaces.     PT Home Exercise Plan  Access Code: [email or web address]     Consulted and Agree with Plan of Care  Patient       Patient will benefit from skilled therapeutic intervention in order to improve the following deficits and impairments:   Abnormal gait, Decreased activity tolerance, Decreased balance, Decreased coordination, Decreased mobility, Decreased strength, Difficulty walking  Visit Diagnosis: Other abnormalities of gait and mobility  Muscle weakness (generalized)  Unsteadiness on feet     Problem List Patient Active Problem List   Diagnosis Date Noted  . Acute CVA (cerebrovascular accident) ([place]) 09/18/2017  . Stroke (cerebrum) ([place]) 09/17/2017  . Diabetes mellitus without complication ([place]) 23/76/2831  . Closed head injury 04/22/2013  . HLD (hyperlipidemia) 07/29/2009  . Essential hypertension 07/29/2009  . EFFUSION, PLEURAL 07/29/2009  . DIVERTICULITIS, COLON 07/29/2009  . 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 COLONIC POLYPS, HYPERPLASTIC, HX OF 07/29/2009    Lanney Gins, PT, DPT Supplemental Physical Therapist 01/06/18 1:11 PM Pager: [phone] Office: [place] [address] [place], [place], [address] Phone: [phone]   Fax:  [phone]  Name: Justin [name] MRN: [id number] Date of Birth: [birth year]

## 2018-01-06 NOTE — Patient Instructions (Signed)
  Please complete the assigned speech therapy homework prior to your next session and return it to the speech therapist at your next visit.  

## 2018-01-06 NOTE — Therapy (Signed)
[facility] [address] [place], [place], [address] Phone: [phone]   Fax:  [phone]  Speech Language Pathology Treatment  Patient Details  Name: Justin [name] MRN: [id number] Date of Birth: May 10, 1940 Referring Provider (SLP): Aura Dials, [place]   Encounter Date: 01/06/2018  End of Session - 01/06/18 1429    Visit Number  5    Number of Visits  17    Date for SLP Re-Evaluation  03/15/18    SLP Start Time  30    SLP Stop Time   1100    SLP Time Calculation (min)  40 min    Activity Tolerance  Patient tolerated treatment well       Past Medical History:  Diagnosis Date  . Brain bleed ([place])   . Cancer [facility])    prostate  . Diabetes mellitus without complication ([place])   . High cholesterol   . Hypertension   . Stroke [facility])     Past Surgical History:  Procedure Laterality Date  . arm & hand surgery Left 1972   skill saw accident  . COLON SURGERY     partial  . EYE SURGERY     lens implant and cateract- Left  . PROSTATECTOMY      There were no vitals filed for this visit.  Subjective Assessment - 01/06/18 1035    Subjective  "I forgot (therapy notebook) at home today."    Currently in Pain?  No/denies            ADULT SLP TREATMENT - 01/06/18 1036      General Information   Behavior/Cognition  Alert;Cooperative;Pleasant mood      Treatment Provided   Treatment provided  Cognitive-Linquistic      Cognitive-Linquistic Treatment   Treatment focused on  Cognition;Aphasia    Skilled Treatment  SLP asked pt about meds, said NP took him off of aspirin (he was not supposed to still be taking), but did not tell SLP about Celexa. SLP told pt he was prescribed med for "mood and motivation" and provided name. 2 minutes later pt had substituted one for the other - told SLP she had put him ON aspirin and taken him OFF the Celexa (pt called it Celestrum). SLP corrected pt and asked 5 minutes  later and pt could not provide indication of how his meds had changed. SLP again told pt and provided "m and m" to assist in memory. 10 minutes later pt again could not provide specifics of med changes after MD visit Wednesday. SLP printed out the changes and provided to pt. 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 Reasoning, attention, awareness all demo'd as deficit areas during this task. SLP discussed session with wife afterwards and wife agreed pt requires improved memory, awareness (and thus motivation) for succes - SLP gave example of pt with med changes today - pt appeared lassez-faire regarding the significance of his deficits with memory. SLP suggested respite care for wife as she told SLP the extent of her frustration with pt at times.      Assessment / Recommendations / Plan   Plan  Continue with current plan of care      Progression Toward Goals   Progression toward goals  Not progressing toward goals (comment)       SLP Education - 01/06/18 1429    Education Details  med changes    Person(s) Educated  Patient  Methods  Explanation;Verbal cues    Comprehension  Need further instruction       SLP Short Term Goals - 01/06/18 1432      SLP SHORT TERM GOAL #1   Title  Complete objective reading comprehension assessment within first 3 ST therapy sessions     Time  2    Period  Weeks    Status  Achieved      SLP SHORT TERM GOAL #2   Title  Pt will tell SLP 2 deficits over 3 sessions    Time  2    Period  Weeks    Status  On-going      SLP SHORT TERM GOAL #3   Title  Pt will demo sustained attention with a simple cognitive linguistic therapy task for 20 minutes with occasional min A back to task over 3 sessions    Time  2    Period  Weeks    Status  On-going      SLP SHORT TERM GOAL #4   Title  Will establish memory system and will bring to therapy 3 sessions    Time  2    Period  Weeks    Status  On-going       SLP SHORT TERM GOAL #5   Title  pt will indicate understanding of written sentence level material 90% success over three sessions    Time  2    Period  Weeks    Status  On-going       SLP Long Term Goals - 01/06/18 1432      SLP LONG TERM GOAL #1   Title  Pt will demo emergent awareness with mod complex task by identifying 70% of errors over 3 sessions with min occasional A     Time  6    Period  Weeks    Status  On-going      SLP LONG TERM GOAL #2   Title  Pt will demo sustained/selective attention necessary for 40 minutes therapy tasks over 5 sessions     Time  6    Period  Weeks    Status  On-going      SLP LONG TERM GOAL #3   Title  Pt will demo adequate alternating attention between two mod complex cognitive tasks to achieve 80% success on each task over two sessions     Time  6    Period  Weeks    Status  On-going      SLP LONG TERM GOAL #4   Title  Pt will demo use of memory system outside of therapy over 4 therapy sessions per patient/spouse report    Time  6    Period  Weeks    Status  On-going      SLP LONG TERM GOAL #5   Title  Pt will demo understanding of functional written langugae (ariticles, multi-sentence texts, etc) with 100% sucess with copmensations    Time  6    Period  Weeks    Status  On-going       Plan - 01/06/18 1429    Clinical Impression Statement  Pt again without notebook for today's session, so he did not bring excercise check list for PT and  ST. PT with significant memory deficits, confirmed by wife with example of questions in car (pt did not recall simple spatial/temporal orientation after 5 minutes). Pt appeared unaffected and/or unaware of deficits today in ST. SLP told pt's wife ST  to cont for approx 2 weeks in hopes pt awareness and memory will improve. If not, putting ST on hold may be necessary. She agrees. Continue skilled ST to maximize cognition, langauge and carryover of compensations for cognitive impairments to improve pt  safety and reduce caregiver burden. If pt continues with low participation rate at home for therapy tasks, d/c may be considered.    Speech Therapy Frequency  2x / week    Treatment/Interventions  Language facilitation;Environmental controls;SLP instruction and feedback;Compensatory techniques;Cognitive reorganization;Functional tasks;Patient/family education;Compensatory strategies;Multimodal communcation approach    Potential to Achieve Goals  Good    SLP Home Exercise Plan  daily cognitive activity;     Consulted and Agree with Plan of Care  Patient;Family member/caregiver       Patient will benefit from skilled therapeutic intervention in order to improve the following deficits and impairments:   Cognitive communication deficit  Aphasia    Problem List Patient Active Problem List   Diagnosis Date Noted  . Acute CVA (cerebrovascular accident) ([place]) 09/18/2017  . 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 Diabetes mellitus without complication ([place]) 38/93/7342  . Closed head injury 04/22/2013  . HLD (hyperlipidemia) 07/29/2009  . Essential hypertension 07/29/2009  . EFFUSION, PLEURAL 07/29/2009  . DIVERTICULITIS, COLON 07/29/2009  . 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 COLONIC POLYPS, HYPERPLASTIC, HX OF 07/29/2009    [facility] ,MS, CCC-SLP  01/06/2018, 2:33 PM  [facility] [address] [place], [place], [address] Phone: [phone]   Fax:  [phone]   Name: ROHAAN DURNIL MRN: [id number] Date of Birth: 05-24-1940

## 2018-01-07 NOTE — Progress Notes (Signed)
I agree with the above plan 

## 2018-01-10 ENCOUNTER — Ambulatory Visit: Payer: Medicare HMO | Admitting: Physical Therapy

## 2018-01-10 ENCOUNTER — Ambulatory Visit: Payer: Medicare HMO

## 2018-01-10 DIAGNOSIS — R2681 Unsteadiness on feet: Secondary | ICD-10-CM

## 2018-01-10 DIAGNOSIS — R2689 Other abnormalities of gait and mobility: Secondary | ICD-10-CM | POA: Diagnosis not present

## 2018-01-10 DIAGNOSIS — R4701 Aphasia: Secondary | ICD-10-CM

## 2018-01-10 DIAGNOSIS — M6281 Muscle weakness (generalized): Secondary | ICD-10-CM

## 2018-01-10 DIAGNOSIS — R41841 Cognitive communication deficit: Secondary | ICD-10-CM

## 2018-01-10 NOTE — Patient Instructions (Signed)
  Please complete the assigned speech therapy homework prior to your next session and return it to the speech therapist at your next visit.  

## 2018-01-10 NOTE — Therapy (Signed)
[facility] [address] [place], [place], [address] Phone: [phone]   Fax:  [phone]  Speech Language Pathology Treatment  Patient Details  Name: Justin [name] MRN: [id number] Date of Birth: 23-Nov-1940 Referring Provider (SLP): Aura Dials, [place]   Encounter Date: 01/10/2018  End of Session - 01/10/18 1209    Visit Number  6    Number of Visits  17    Date for SLP Re-Evaluation  03/15/18    SLP Start Time  1105    SLP Stop Time   1145    SLP Time Calculation (min)  40 min    Activity Tolerance  Patient tolerated treatment well       Past Medical History:  Diagnosis Date  . Brain bleed ([place])   . Cancer [facility])    prostate  . Diabetes mellitus without complication ([place])   . High cholesterol   . Hypertension   . Stroke [facility])     Past Surgical History:  Procedure Laterality Date  . arm & hand surgery Left 1972   skill saw accident  . COLON SURGERY     partial  . EYE SURGERY     lens implant and cateract- Left  . PROSTATECTOMY      There were no vitals filed for this visit.  Subjective Assessment - 01/10/18 1106    Subjective  "They tell me I can't drive." (SLP asked pt why) "Because I might be thinking about something else."    Currently in Pain?  No/denies            ADULT SLP TREATMENT - 01/10/18 1106      General Information   Behavior/Cognition  Alert;Cooperative;Pleasant mood      Treatment Provided   Treatment provided  Cognitive-Linquistic      Cognitive-Linquistic Treatment   Treatment focused on  Cognition    Skilled Treatment  Pt did not express any disappointment about not driving, just stated people told him he couldn't. Pt's homework was incomplete. He req'd consistent cues for sustained attention after 20 seconds in a simple 4-5 word written fill-in task. SLP questioning cues usually needed for emergent awareness. With three choices of words as options pt req'd max  cues consistently to read the sentence to fill in and not guess - pt was putting forth little effort /was unable to put in effort into looking critically at the problem.      Assessment / Recommendations / Plan   Plan  Continue with current plan of care      Progression Toward Goals   Progression toward goals  Progressing toward goals   slightly better attention      SLP Education - 01/10/18 1209    Education Details  (slightly) better attention today    Person(s) Educated  Spouse    Methods  Explanation    Comprehension  Verbalized understanding       SLP Short Term Goals - 01/10/18 1215      SLP SHORT TERM GOAL #1   Title  Complete objective reading comprehension assessment within first 3 ST therapy sessions     Status  Achieved      SLP SHORT TERM GOAL #2   Title  Pt will tell SLP 2 deficits over 3 sessions    Time  1    Period  Weeks    Status  On-going      SLP SHORT TERM GOAL #3   Title  Pt  will demo sustained attention with a simple cognitive linguistic therapy task for 20 minutes with occasional min A back to task over 3 sessions    Time  1    Period  Weeks    Status  On-going      SLP SHORT TERM GOAL #4   Title  Will establish memory system and will bring to therapy 3 sessions    Baseline  01-10-18    Time  1    Period  Weeks    Status  On-going      SLP SHORT TERM GOAL #5   Title  pt will indicate understanding of written sentence level material 90% success over three sessions    Time  1    Period  Weeks    Status  On-going       SLP Long Term Goals - 01/10/18 1219      SLP LONG TERM GOAL #1   Title  Pt will demo emergent awareness with mod complex task by identifying 70% of errors over 3 sessions with min occasional A     Time  5    Period  Weeks    Status  On-going      SLP LONG TERM GOAL #2   Title  Pt will demo sustained/selective attention necessary for 40 minutes therapy tasks over 5 sessions     Time  5    Period  Weeks    Status   On-going      SLP LONG TERM GOAL #3   Title  Pt will demo adequate alternating attention between two mod complex cognitive tasks to achieve 80% success on each task over two sessions     Time  5    Period  Weeks    Status  On-going      SLP LONG TERM GOAL #4   Title  Pt will demo use of memory system outside of therapy over 4 therapy sessions per patient/spouse report    Time  5    Period  Weeks    Status  On-going      SLP LONG TERM GOAL #5   Title  Pt will demo understanding of functional written langugae (ariticles, multi-sentence texts, etc) with 100% sucess with copmensations    Time  5    Period  Weeks    Status  On-going       Plan - 01/10/18 1210    Clinical Impression Statement  Pt  brought notebook for today's session, with exercise sheet blank except for one square for blood pressure. Pt cont with significant memory and basic attention deficits, albeit with SLIGHT improvement on sustained attention today. Pt again appeared unaffected and/or unaware of deficits today in ST despite SLP pointing out pt difficulty with attention and abilty to solve very simple solutions. ST to cont for this week and next in hopes pt awareness and memory will improve. If not, putting ST on hold may be necessary. She agrees. Continue skilled ST to maximize cognition, langauge and carryover of compensations for cognitive impairments to improve pt safety and reduce caregiver burden. If pt continues with low participation rate at home for therapy tasks, d/c may be considered.    Speech Therapy Frequency  2x / week    Treatment/Interventions  Language facilitation;Environmental controls;SLP instruction and feedback;Compensatory techniques;Cognitive reorganization;Functional tasks;Patient/family education;Compensatory strategies;Multimodal communcation approach    Potential to Achieve Goals  Good    SLP Home Exercise Plan  daily cognitive activity;  Consulted and Agree with Plan of Care  Patient;Family  member/caregiver       Patient will benefit from skilled therapeutic intervention in order to improve the following deficits and impairments:   Cognitive communication deficit  Aphasia    Problem List Patient Active Problem List   Diagnosis Date Noted  . Acute CVA (cerebrovascular accident) ([place]) 09/18/2017  . 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 Diabetes mellitus without complication ([place]) 49/17/9150  . Closed head injury 04/22/2013  . HLD (hyperlipidemia) 07/29/2009  . Essential hypertension 07/29/2009  . EFFUSION, PLEURAL 07/29/2009  . DIVERTICULITIS, COLON 07/29/2009  . 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 COLONIC POLYPS, HYPERPLASTIC, HX OF 07/29/2009    [facility] ,MS, CCC-SLP  01/10/2018, 12:20 PM  [facility] [address] [place], [place], [address] Phone: [phone]   Fax:  [phone]   Name: Justin [name] MRN: [id number] Date of Birth: February 09, 1941

## 2018-01-10 NOTE — Therapy (Signed)
[facility] [address] [place], [place], [address] Phone: [phone]   Fax:  [phone]  Physical Therapy Treatment  Patient Details  Name: Justin [name] MRN: [id number] Date of Birth: [birth year] Referring Provider (PT): Roe Coombs   Encounter Date: 01/10/2018  PT End of Session - 01/10/18 1103    Visit Number  7    Number of Visits  18    Date for PT Re-Evaluation  03/15/18    Authorization Type  [employer] Medicare-will need 10th visit progress note    Authorization Time Period  Medicare cert period:  12/22/57-4/58/59    PT Start Time  1017    PT Stop Time  1100    PT Time Calculation (min)  43 min    Activity Tolerance  Patient tolerated treatment well    Behavior During Therapy  [facility] for tasks assessed/performed       Past Medical History:  Diagnosis Date  . Brain bleed ([place])   . Cancer [facility])    prostate  . Diabetes mellitus without complication ([place])   . High cholesterol   . Hypertension   . Stroke [facility])     Past Surgical History:  Procedure Laterality Date  . arm & hand surgery Left 1972   skill saw accident  . COLON SURGERY     partial  . EYE SURGERY     lens implant and cateract- Left  . PROSTATECTOMY      There were no vitals filed for this visit.  Subjective Assessment - 01/10/18 1058    Subjective  Pt relays no complaints today but his wife still gets on to him about dragging his Rt foot sometimes    Currently in Pain?  No/denies                       [facility] Adult PT Treatment/Exercise - 01/10/18 0001      Ambulation/Gait   Ambulation/Gait  Yes    Ambulation/Gait Assistance  5: Supervision    Ambulation Distance (Feet)  500 Feet    Gait Comments  cues for increased step length and heelstike for Rt foot, able to better correct when he is made aware.      High Level Balance   High Level Balance Comments  standing on foam with perturbations all directions, heel taps  at step without UE support X 20 bilat, H/T raises on foam with one finger tip support each arm, stepping over hurdles fwd and lateral at counter without UE support up/down X 5 with 4 hurdles      Exercises   Exercises  Knee/Hip      Knee/Hip Exercises: Stretches   Active Hamstring Stretch  Right;Left;2 reps;30 seconds      Knee/Hip Exercises: Standing   Lateral Step Up  Step Height: 6";20 reps;Right;Hand Hold: 1    Forward Step Up  Step Height: 6";20 reps;Right;Hand Hold: 1      Knee/Hip Exercises: Seated   Sit to Sand  2 sets;10 reps;without UE support               PT Short Term Goals - 12/15/17 1159      PT SHORT TERM GOAL #1   Title  Pt will perform HEP with wife's supervision for improved strength, balance, gait.  TARGET 01/20/18    Time  5    Period  Weeks    Status  New    Target Date  01/20/18  PT SHORT TERM GOAL #2   Title  Pt will improve 5x sit<>stand to less than or equal to 18 seconds for improved transfer efficiency and safety.    Time  5    Period  Weeks    Status  New    Target Date  01/20/18      PT SHORT TERM GOAL #3   Title  Pt will improve TUG score to less than or equal to 13.5 seconds for decreased fall risk.    Time  5    Period  Weeks    Status  New    Target Date  01/20/18      PT SHORT TERM GOAL #4   Title  Pt will improve DGI score to at least 18/24 for decreased fall risk.    Time  5    Period  Weeks    Status  New    Target Date  01/20/18      PT SHORT TERM GOAL #5   Title  Pt/wife will verbalize understanding of fall prevention in home environment.    Time  5    Period  Weeks    Status  New    Target Date  01/20/18        PT Long Term Goals - 12/15/17 1203      PT LONG TERM GOAL #1   Title  Pt will be independent with progression of HEP for improved strength, balance, gait.  TARGET 02/17/18    Time  9    Period  Weeks    Status  New    Target Date  02/17/18      PT LONG TERM GOAL #2   Title  Pt will improve  gait velocity to at least 2.62 ft/sec for decreased fall risk, improved gait efficiency.    Time  9    Period  Weeks    Status  New    Target Date  02/17/18      PT LONG TERM GOAL #3   Title  Pt will improve DGI 20/24 for decreased fall risk.    Time  9    Period  Weeks    Status  New    Target Date  02/17/18      PT LONG TERM GOAL #4   Title  Pt will ambulate at least 1000 ft, indoors and outdoor surfaces, independently for improved outdoor gait efficiency and safety.    Time  9    Period  Weeks    Status  New    Target Date  02/17/18      PT LONG TERM GOAL #5   Title  Pt will perform floor>stand transfer with UE support, modified independently, for improved fall recovery.    Time  9    Period  Weeks    Status  New    Target Date  02/17/18            Plan - 01/10/18 1103    Clinical Impression Statement  Pt continues to demonstrate steady progress in Rt LE stength, activity tolerance, and balance. He did well with high level balance today and was overall supervision with just one episode of min A with stepping over hurdles. He was able to progress Rt LE strengthening today with good tolerance. PT will continue to progress toward funcitonal goals    Rehab Potential  Good    PT Frequency  2x / week    PT Duration  Other (comment)  PT Treatment/Interventions  ADLs/Self Care Home Management;DME Instruction;Therapeutic exercise;Balance training;Therapeutic activities;Functional mobility training;Stair training;Gait training;Neuromuscular re-education;Patient/family education    PT Next Visit Plan  Continue R hip strengthening, RLE strengthening, standing balance/gait activities); gait for improved R foot clearance/heelstrike, gait with obstacles/hurdles/compliant surfaces.     PT Home Exercise Plan  Access Code: [email or web address]     Consulted and Agree with Plan of Care  Patient       Patient will benefit from skilled therapeutic intervention in order to improve the following  deficits and impairments:  Abnormal gait, Decreased activity tolerance, Decreased balance, Decreased coordination, Decreased mobility, Decreased strength, Difficulty walking  Visit Diagnosis: Other abnormalities of gait and mobility  Muscle weakness (generalized)  Unsteadiness on feet     Problem List Patient Active Problem List   Diagnosis Date Noted  . Acute CVA (cerebrovascular accident) ([place]) 09/18/2017  . 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 Diabetes mellitus without complication ([place]) 58/59/2924  . Closed head injury 04/22/2013  . HLD (hyperlipidemia) 07/29/2009  . Essential hypertension 07/29/2009  . EFFUSION, PLEURAL 07/29/2009  . DIVERTICULITIS, COLON 07/29/2009  . 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 COLONIC POLYPS, HYPERPLASTIC, HX OF 07/29/2009    Debbe Odea, PT,DPT 01/10/2018, 11:06 AM  [facility] [address] [place], [place], [address] Phone: [phone]   Fax:  [phone]  Name: HANI CAMPUSANO MRN: [id number] Date of Birth: [birth year]

## 2018-01-13 ENCOUNTER — Ambulatory Visit: Payer: Medicare HMO

## 2018-01-13 DIAGNOSIS — M6281 Muscle weakness (generalized): Secondary | ICD-10-CM

## 2018-01-13 DIAGNOSIS — R2681 Unsteadiness on feet: Secondary | ICD-10-CM

## 2018-01-13 DIAGNOSIS — R2689 Other abnormalities of gait and mobility: Secondary | ICD-10-CM

## 2018-01-13 DIAGNOSIS — R41841 Cognitive communication deficit: Secondary | ICD-10-CM

## 2018-01-13 DIAGNOSIS — R4701 Aphasia: Secondary | ICD-10-CM

## 2018-01-13 NOTE — Patient Instructions (Signed)

## 2018-01-13 NOTE — Patient Instructions (Signed)
  Please complete the assigned speech therapy homework prior to your next session and return it to the speech therapist at your next visit.  

## 2018-01-13 NOTE — Therapy (Signed)
[facility] [address] [place], [place], [address] Phone: [phone]   Fax:  [phone]  Speech Language Pathology Treatment  Patient Details  Name: Justin [name] MRN: [id number] Date of Birth: 12/22/1940 Referring Provider (SLP): Aura Dials, [place]   Encounter Date: 01/13/2018  End of Session - 01/13/18 1453    Visit Number  7    Number of Visits  17    Date for SLP Re-Evaluation  03/15/18    SLP Start Time  8841    SLP Stop Time   1445    SLP Time Calculation (min)  42 min    Activity Tolerance  Patient tolerated treatment well       Past Medical History:  Diagnosis Date  . Brain bleed ([place])   . Cancer [facility])    prostate  . Diabetes mellitus without complication ([place])   . High cholesterol   . Hypertension   . Stroke [facility])     Past Surgical History:  Procedure Laterality Date  . arm & hand surgery Left 1972   skill saw accident  . COLON SURGERY     partial  . EYE SURGERY     lens implant and cateract- Left  . PROSTATECTOMY      There were no vitals filed for this visit.  Subjective Assessment - 01/13/18 1407    Subjective  "Hey sassy! (to receptionist)"    Currently in Pain?  No/denies            ADULT SLP TREATMENT - 01/13/18 1408      General Information   Behavior/Cognition  Alert;Pleasant mood;Cooperative;Distractible;Requires cueing      Treatment Provided   Treatment provided  Cognitive-Linquistic      Cognitive-Linquistic Treatment   Treatment focused on  Cognition    Skilled Treatment  Pt going through his binder to show SLP homework extra time needed to find homework but pt found correct set of papers- pt completed approx 95% of it, without emergent awareness in this simple categorization task requiring alternating attention and sustained attention to look for correct category and keep stimulus word in mind. SLP req'd to provide max cues for errors, and max A for  problem solving. (Micrographia noted)  With 4-step sequences, pt found 2/2 errors with questioning cues to "look again" with simple sequences (boarding a train, boy waking up).       Assessment / Recommendations / Plan   Plan  Continue with current plan of care      Progression Toward Goals   Progression toward goals  Progressing toward goals   emergent awareness slowly improving in simple tasks        SLP Short Term Goals - 01/13/18 1506      SLP SHORT TERM GOAL #1   Title  Complete objective reading comprehension assessment within first 3 ST therapy sessions     Status  Achieved      SLP SHORT TERM GOAL #2   Title  Pt will tell SLP 2 deficits over 3 sessions    Status  Not Met      SLP SHORT TERM GOAL #3   Title  Pt will demo sustained attention with a simple cognitive linguistic therapy task for 20 minutes with occasional min A back to task over 3 sessions    Status  Not Met      SLP SHORT TERM GOAL #4   Title  Will establish memory system and will bring to  therapy 3 sessions    Status  Partially Met   2/3 sessions     SLP SHORT TERM GOAL #5   Title  pt will indicate understanding of written sentence level material 90% success over three sessions    Status  Partially Met   short sentences (</= 4 words)      SLP Long Term Goals - 01/13/18 1509      SLP LONG TERM GOAL #1   Title  Pt will demo emergent awareness with mod complex task by identifying 70% of errors over 3 sessions with min occasional A     Time  5    Period  Weeks    Status  On-going      SLP LONG TERM GOAL #2   Title  Pt will demo sustained/selective attention necessary for 40 minutes therapy tasks over 5 sessions     Time  5    Period  Weeks    Status  On-going      SLP LONG TERM GOAL #3   Title  Pt will demo adequate alternating attention between two mod complex cognitive tasks to achieve 80% success on each task over two sessions     Time  5    Period  Weeks    Status  On-going      SLP  LONG TERM GOAL #4   Title  Pt will demo use of memory system outside of therapy over 4 therapy sessions per patient/spouse report    Time  5    Period  Weeks    Status  On-going      SLP LONG TERM GOAL #5   Title  Pt will demo understanding of functional written langugae (ariticles, multi-sentence texts, etc) with 100% sucess with copmensations    Time  5    Period  Weeks    Status  On-going       Plan - 01/13/18 1453    Clinical Impression Statement  Pt  brought notebook for today's session, without blanks filled in. Pt cont with significant memory and basic attention deficits, albeit with improvement on emergent awareness in SIMPLE tasks with questioning cues. In more complex tasks (still classified as simple) pt req'd max cues for emergent awareness and for reasoning/problem solving. Pt appeared unaffected by his errors so SLP again pointed out pt difficulty with pt inabilty to solve very simple things. ST to cont for this week and next in hopes pt awareness and memory will improve. If not, putting ST on hold may be necessary. She agrees. Continue skilled ST to maximize cognition, langauge and carryover of compensations for cognitive impairments to improve pt safety and reduce caregiver burden. If pt continues with low participation rate at home for therapy tasks, d/c may be considered.    Speech Therapy Frequency  2x / week    Duration  --   8 weeks/17 visits   Treatment/Interventions  Language facilitation;Environmental controls;SLP instruction and feedback;Compensatory techniques;Cognitive reorganization;Functional tasks;Patient/family education;Compensatory strategies;Multimodal communcation approach    Potential to Achieve Goals  Good    SLP Home Exercise Plan  daily cognitive activity;     Consulted and Agree with Plan of Care  Patient;Family member/caregiver       Patient will benefit from skilled therapeutic intervention in order to improve the following deficits and impairments:    Cognitive communication deficit  Aphasia    Problem List Patient Active Problem List   Diagnosis Date Noted  . Acute CVA (cerebrovascular accident) ([place]) 09/18/2017  .  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 Diabetes mellitus without complication ([place]) 31/10/1454  . Closed head injury 04/22/2013  . HLD (hyperlipidemia) 07/29/2009  . Essential hypertension 07/29/2009  . EFFUSION, PLEURAL 07/29/2009  . DIVERTICULITIS, COLON 07/29/2009  . 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 COLONIC POLYPS, HYPERPLASTIC, HX OF 07/29/2009    [facility] ,MS, CCC-SLP  01/13/2018, 3:10 PM  [facility] [address] [place], [place], [address] Phone: [phone]   Fax:  [phone]   Name: Justin [name] MRN: [id number] Date of Birth: 05/29/1940

## 2018-01-14 NOTE — Therapy (Signed)
[facility] [address] [place], [place], [address] Phone: [phone]   Fax:  [phone]  Physical Therapy Treatment  Patient Details  Name: Justin [name] MRN: [id number] Date of Birth: August 07, 1940 Referring Provider (PT): Roe Coombs   Encounter Date: 01/13/2018  PT End of Session - 01/13/18 1451    Visit Number  8    Number of Visits  18    Date for PT Re-Evaluation  03/15/18    Authorization Type  [employer]-will need 10th visit progress note    Authorization Time Period  Medicare cert period:  32/54/98-2/64/15    PT Start Time  1449    PT Stop Time  1530    PT Time Calculation (min)  41 min    Equipment Utilized During Treatment  Gait belt    Activity Tolerance  Patient tolerated treatment well    Behavior During Therapy  [facility] for tasks assessed/performed       Past Medical History:  Diagnosis Date  . Brain bleed ([place])   . Cancer [facility])    prostate  . Diabetes mellitus without complication ([place])   . High cholesterol   . Hypertension   . Stroke [facility])     Past Surgical History:  Procedure Laterality Date  . arm & hand surgery Left 1972   skill saw accident  . COLON SURGERY     partial  . EYE SURGERY     lens implant and cateract- Left  . PROSTATECTOMY      There were no vitals filed for this visit.  Subjective Assessment - 01/13/18 1450    Subjective  No new falls, HEP is going well "when i do them all".     Patient is accompained by:  Family member    Patient Stated Goals  Pt's goal for therapy is to be able to work around the house more (with less fatigue).  To improve walking and stop dragging R foot.    Currently in Pain?  No/denies         [facility] PT Assessment - 01/14/18 0001      Transfers   Five time sit to stand comments   14.60 with no UE support required      Timed Up and Go Test   Normal TUG (seconds)  12.03    TUG Comments  No AD required.          [facility] Adult PT  Treatment/Exercise - 01/14/18 0001      Ambulation/Gait   Ambulation/Gait  --    Ambulation/Gait Assistance  --    Ambulation Distance (Feet)  --    Gait Comments  --      High Level Balance   High Level Balance Comments  --      Neuro Re-ed    Neuro Re-ed Details   Pt in parallel bars on rockerboard performing ant/post/lat weightshift/static standing with SUE support progressing to no UE support, EO progressing to EO with strong ant lean, verbal and tactile feedback given for pt correction.       Exercises   Exercises  Knee/Hip      Knee/Hip Exercises: Stretches   Active Hamstring Stretch  --      Knee/Hip Exercises: Standing   Lateral Step Up  Both;1 set;10 reps;Hand Hold: 0;Step Height: 6"    Forward Step Up  Both;1 set;10 reps;Hand Hold: 0;Step Height: 6"    Other Standing Knee Exercises  Side stepping with counter for support,  1 set/4reps.      Knee/Hip Exercises: Seated   Long Arc Quad  AROM;Strengthening;Both;1 set;10 reps    Sit to [employer]  1 set;10 reps;without UE support      Knee/Hip Exercises: Supine   Bridges  AROM;Strengthening;Both;1 set;10 reps      Knee/Hip Exercises: Sidelying   Hip ABduction  AROM;Strengthening;Both;1 set;10 reps         PT Education - 01/14/18 1206    Education Details  Fall prevention in home environment    Person(s) Educated  Patient    Methods  Explanation;Handout;Verbal cues    Comprehension  Verbalized understanding;Verbal cues required;Need further instruction       PT Short Term Goals - 01/13/18 1451      PT SHORT TERM GOAL #1   Title  Pt will perform HEP with wife's supervision for improved strength, balance, gait.  TARGET 01/20/18    Baseline  11/22: Pt able to perform HEP independently.     Time  5    Period  Weeks    Status  Achieved      PT SHORT TERM GOAL #2   Title  Pt will improve 5x sit<>stand to less than or equal to 18 seconds for improved transfer efficiency and safety.    Baseline  11/22: 14.60 secs with  no UE support required.     Time  5    Period  Weeks    Status  Achieved      PT SHORT TERM GOAL #3   Title  Pt will improve TUG score to less than or equal to 13.5 seconds for decreased fall risk.    Baseline  11/22: 12.03 secs    Time  5    Period  Weeks    Status  Achieved      PT SHORT TERM GOAL #4   Title  Pt will improve DGI score to at least 18/24 for decreased fall risk.    Time  5    Period  Weeks    Status  New      PT SHORT TERM GOAL #5   Title  Pt/wife will verbalize understanding of fall prevention in home environment.    Baseline  11/22: Pt verbalizes understanding of fall prevention in home.     Time  5    Period  Weeks    Status  Achieved        PT Long Term Goals - 12/15/17 1203      PT LONG TERM GOAL #1   Title  Pt will be independent with progression of HEP for improved strength, balance, gait.  TARGET 02/17/18    Time  9    Period  Weeks    Status  New    Target Date  02/17/18      PT LONG TERM GOAL #2   Title  Pt will improve gait velocity to at least 2.62 ft/sec for decreased fall risk, improved gait efficiency.    Time  9    Period  Weeks    Status  New    Target Date  02/17/18      PT LONG TERM GOAL #3   Title  Pt will improve DGI 20/24 for decreased fall risk.    Time  9    Period  Weeks    Status  New    Target Date  02/17/18      PT LONG TERM GOAL #4   Title  Pt will ambulate at  least 1000 ft, indoors and outdoor surfaces, independently for improved outdoor gait efficiency and safety.    Time  9    Period  Weeks    Status  New    Target Date  02/17/18      PT LONG TERM GOAL #5   Title  Pt will perform floor>stand transfer with UE support, modified independently, for improved fall recovery.    Time  9    Period  Weeks    Status  New    Target Date  02/17/18            Plan - 01/14/18 1207    Clinical Impression Statement  Todays skilled session focused on high level balance on rockerboard while decreasing UE support  required and assessing short term goals. Pt has met 4/5 goals with 1/5 ongoing showing that pt has improved and reduced fall risk. Pt should benefit from continued PT sessions to continue to progress and work towards goals.     Rehab Potential  Good    PT Frequency  2x / week    PT Duration  Other (comment)    PT Treatment/Interventions  ADLs/Self Care Home Management;DME Instruction;Therapeutic exercise;Balance training;Therapeutic activities;Functional mobility training;Stair training;Gait training;Neuromuscular re-education;Patient/family education    PT Next Visit Plan  Assess remaining STG, Continue R hip strengthening, RLE strengthening, standing balance/gait activities); gait for improved R foot clearance/heelstrike, gait with obstacles/hurdles/compliant surfaces.     PT Home Exercise Plan  Access Code: [email or web address]     Consulted and Agree with Plan of Care  Patient       Patient will benefit from skilled therapeutic intervention in order to improve the following deficits and impairments:  Abnormal gait, Decreased activity tolerance, Decreased balance, Decreased coordination, Decreased mobility, Decreased strength, Difficulty walking  Visit Diagnosis: Other abnormalities of gait and mobility  Muscle weakness (generalized)  Unsteadiness on feet     Problem List Patient Active Problem List   Diagnosis Date Noted  . Acute CVA (cerebrovascular accident) ([place]) 09/18/2017  . 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 Diabetes mellitus without complication ([place]) 81/03/5484  . Closed head injury 04/22/2013  . HLD (hyperlipidemia) 07/29/2009  . Essential hypertension 07/29/2009  . EFFUSION, PLEURAL 07/29/2009  . DIVERTICULITIS, COLON 07/29/2009  . 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 COLONIC POLYPS, HYPERPLASTIC, HX OF 07/29/2009   Justin [name], PTA  Justin [name] 01/14/2018, 12:16 PM  [facility] [address] [place], [place], [address] Phone: [phone]   Fax:  [phone]  Name: AZION CENTRELLA MRN: [id number] Date of Birth: [birth year]

## 2018-01-16 ENCOUNTER — Ambulatory Visit: Payer: Medicare HMO | Admitting: Physical Therapy

## 2018-01-16 ENCOUNTER — Encounter: Payer: Self-pay | Admitting: Physical Therapy

## 2018-01-16 ENCOUNTER — Ambulatory Visit: Payer: Medicare HMO | Admitting: Speech Pathology

## 2018-01-16 DIAGNOSIS — R2681 Unsteadiness on feet: Secondary | ICD-10-CM

## 2018-01-16 DIAGNOSIS — R2689 Other abnormalities of gait and mobility: Secondary | ICD-10-CM | POA: Diagnosis not present

## 2018-01-16 DIAGNOSIS — R4701 Aphasia: Secondary | ICD-10-CM

## 2018-01-16 DIAGNOSIS — R41841 Cognitive communication deficit: Secondary | ICD-10-CM

## 2018-01-16 DIAGNOSIS — M6281 Muscle weakness (generalized): Secondary | ICD-10-CM

## 2018-01-16 NOTE — Therapy (Signed)
[facility] [address] [place], [place], [address] Phone: [phone]   Fax:  [phone]  Speech Language Pathology Treatment  Patient Details  Name: Justin [name] MRN: [id number] Date of Birth: 05-15-1940 Referring Provider (SLP): Aura Dials, [place]   Encounter Date: 01/16/2018  End of Session - 01/16/18 1100    Visit Number  8    Number of Visits  17    Date for SLP Re-Evaluation  03/15/18    SLP Start Time  0933    SLP Stop Time   2620    SLP Time Calculation (min)  45 min    Activity Tolerance  Patient tolerated treatment well       Past Medical History:  Diagnosis Date  . Brain bleed ([place])   . Cancer [facility]    prostate  . Diabetes mellitus without complication ([place])   . High cholesterol   . Hypertension   . Stroke [facility])     Past Surgical History:  Procedure Laterality Date  . arm & hand surgery Left 1972   skill saw accident  . COLON SURGERY     partial  . EYE SURGERY     lens implant and cateract- Left  . PROSTATECTOMY      There were no vitals filed for this visit.  Subjective Assessment - 01/16/18 0933    Subjective  "Let me get my booklet."             ADULT SLP TREATMENT - 01/16/18 0933      General Information   Behavior/Cognition  Alert;Pleasant mood;Cooperative;Distractible;Requires cueing      Treatment Provided   Treatment provided  Cognitive-Linquistic      Pain Assessment   Pain Assessment  No/denies pain      Cognitive-Linquistic Treatment   Treatment focused on  Cognition    Skilled Treatment  With extended time pt located homework and showed to SLP. Pt without emergent awareness of items left blank (located with verbal cues to see if complete), and max A required to ID errors in completed work. For unscrambling sentences (3 words), pt impulsive and unaware of erasing and repeatedly writing the same incorrect response. SLP encouraged pt to state his sentence  out loud to see if it "made sense"; pt cont to verbal require cues to do so usually, and was unaware of incorrect response 50% of the time. Micrographia persists. With category fill-ins, pt awareness of errors in spelling was 20% without assistance. Cues for sustained attention required throughout session, with pt stopping himself mid-task at one point to reach over and pull a strand of hair off of SLP's sleeve.       Assessment / Recommendations / Plan   Plan  --   Therapy hold due to lack of progress     Progression Toward Goals   Progression toward goals  Not progressing toward goals (comment)   awareness remains unchanged from previous session        SLP Short Term Goals - 01/16/18 1059      SLP SHORT TERM GOAL #1   Title  Complete objective reading comprehension assessment within first 3 ST therapy sessions     Status  Achieved      SLP SHORT TERM GOAL #2   Title  Pt will tell SLP 2 deficits over 3 sessions    Status  Not Met      SLP SHORT TERM GOAL #3   Title  Pt will  demo sustained attention with a simple cognitive linguistic therapy task for 20 minutes with occasional min A back to task over 3 sessions    Status  Not Met      SLP SHORT TERM GOAL #4   Title  Will establish memory system and will bring to therapy 3 sessions    Status  Partially Met      SLP SHORT TERM GOAL #5   Title  pt will indicate understanding of written sentence level material 90% success over three sessions    Status  Partially Met       SLP Long Term Goals - 01/16/18 1100      SLP LONG TERM GOAL #1   Title  Pt will demo emergent awareness with mod complex task by identifying 70% of errors over 3 sessions with min occasional A     Time  4    Period  Weeks    Status  On-going      SLP LONG TERM GOAL #2   Title  Pt will demo sustained/selective attention necessary for 40 minutes therapy tasks over 5 sessions     Time  4    Period  Weeks    Status  On-going      SLP LONG TERM GOAL #3    Title  Pt will demo adequate alternating attention between two mod complex cognitive tasks to achieve 80% success on each task over two sessions     Time  4    Period  Weeks    Status  On-going      SLP LONG TERM GOAL #4   Title  Pt will demo use of memory system outside of therapy over 4 therapy sessions per patient/spouse report    Time  4    Period  Weeks    Status  On-going      SLP LONG TERM GOAL #5   Title  Pt will demo understanding of functional written langugae (ariticles, multi-sentence texts, etc) with 100% sucess with copmensations    Time  4    Period  Weeks    Status  On-going       Plan - 01/16/18 1143    Clinical Impression Statement  Pt cont with significant memory and basic attention deficits emergent awareness in simple tasks appears unchanged from previous session. Pt continues to appear unaffected by his errors. Pt required cues for sustained attention frequently during session today. Spoke with pt's wife who is in agreement to hold therapy ~5 weeks and reassess when pt returns for appointment on Jan. 9. Provided cognitive-linguistic tasks for home and encouraged spouse to continue using memory compensation systems with pt during this time. Continue skilled ST to maximize cognition, langauge and carryover of compensations for cognitive impairments to improve pt safety and reduce caregiver burden. If pt continues with low participation rate at home for therapy tasks, d/c may be considered.    Speech Therapy Frequency  --   therapy hold until 03/02/18   Treatment/Interventions  Language facilitation;Environmental controls;SLP instruction and feedback;Compensatory techniques;Cognitive reorganization;Functional tasks;Patient/family education;Compensatory strategies;Multimodal communcation approach    Potential to Achieve Goals  Fair    Potential Considerations  --   poor awareness of deficits   Consulted and Agree with Plan of Care  Patient;Family member/caregiver        Patient will benefit from skilled therapeutic intervention in order to improve the following deficits and impairments:   Cognitive communication deficit  Aphasia    Problem List Patient  Active Problem List   Diagnosis Date Noted  . Acute CVA (cerebrovascular accident) ([place]) 09/18/2017  . 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 Diabetes mellitus without complication ([place]) 83/23/4688  . Closed head injury 04/22/2013  . HLD (hyperlipidemia) 07/29/2009  . Essential hypertension 07/29/2009  . EFFUSION, PLEURAL 07/29/2009  . DIVERTICULITIS, COLON 07/29/2009  . 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 COLONIC POLYPS, HYPERPLASTIC, HX OF 07/29/2009   Deneise Lever, [place], CCC-SLP Speech-Language Pathologist  Aliene Altes 01/16/2018, 11:47 AM  [facility] [address] [place], [place], [address] Phone: [phone]   Fax:  [phone]   Name: Justin [name] MRN: [id number] Date of Birth: [birth year]

## 2018-01-16 NOTE — Patient Instructions (Addendum)
Access Code: [email or web address]  URL: [email or web address]  Date: 01/16/2018  Prepared by: Mady Haagensen   Exercises  Supine Bridge - 10 reps - 3 sets - 2x daily - 6x weekly  Sidelying Hip Abduction - 10 reps - 1-3 sets - 2x daily - 6x weekly  Seated Long Arc Quad - 10 reps - 2-3 sets - 2x daily - 6x weekly  Sit to Stand without Arm Support - 10 reps - 1 sets - 2x daily - 6x weekly  Step Up - 10 reps - 3 sets - 2x daily - 6x weekly  Lateral Step Up - 10 reps - 3 sets - 2x daily - 6x weekly  Side Stepping with Counter Support - 3-5 reps - 1 sets - 2x daily - 6x weekly  Added 01/16/18  March in Place - 10 reps - 2-3 sets - 1x daily - 6x weekly  Heel Toe Raises with Counter Support - 10 reps - 2-3 sets - 3 sec hold - 1x daily - 6x weekly

## 2018-01-17 ENCOUNTER — Ambulatory Visit (INDEPENDENT_AMBULATORY_CARE_PROVIDER_SITE_OTHER): Payer: Medicare HMO | Admitting: Neurology

## 2018-01-17 DIAGNOSIS — E669 Obesity, unspecified: Secondary | ICD-10-CM

## 2018-01-17 DIAGNOSIS — M7989 Other specified soft tissue disorders: Secondary | ICD-10-CM

## 2018-01-17 DIAGNOSIS — R0683 Snoring: Secondary | ICD-10-CM

## 2018-01-17 DIAGNOSIS — G4733 Obstructive sleep apnea (adult) (pediatric): Secondary | ICD-10-CM | POA: Diagnosis not present

## 2018-01-17 DIAGNOSIS — Z8673 Personal history of transient ischemic attack (TIA), and cerebral infarction without residual deficits: Secondary | ICD-10-CM

## 2018-01-17 DIAGNOSIS — G4720 Circadian rhythm sleep disorder, unspecified type: Secondary | ICD-10-CM

## 2018-01-17 DIAGNOSIS — R400 Somnolence: Secondary | ICD-10-CM

## 2018-01-17 NOTE — Therapy (Signed)
[facility] [address] [place], [place], [address] Phone: [phone]   Fax:  [phone]  Physical Therapy Treatment  Patient Details  Name: Justin [name] MRN: [id number] Date of Birth: 09-May-1940 Referring Provider (PT): Roe Coombs   Encounter Date: 01/16/2018  PT End of Session - 01/17/18 1603    Visit Number  9    Number of Visits  18    Date for PT Re-Evaluation  03/15/18    Authorization Type  [employer] Medicare-will need 10th visit progress note (PN completed at 9th visit on 01/16/18)   Authorization Time Period  Medicare cert period:  37/29/02-03/04/53    PT Start Time  0852    PT Stop Time  0931    PT Time Calculation (min)  39 min    Equipment Utilized During Treatment  Gait belt    Activity Tolerance  Patient tolerated treatment well    Behavior During Therapy  [facility] for tasks assessed/performed       Past Medical History:  Diagnosis Date  . Brain bleed ([place])   . Cancer [facility])    prostate  . Diabetes mellitus without complication ([place])   . High cholesterol   . Hypertension   . Stroke [facility])     Past Surgical History:  Procedure Laterality Date  . arm & hand surgery Left 1972   skill saw accident  . COLON SURGERY     partial  . EYE SURGERY     lens implant and cateract- Left  . PROSTATECTOMY      There were no vitals filed for this visit.  Subjective Assessment - 01/16/18 0856    Subjective  I feel like I've overall improved.  Still need to work on dragging of my R foot.    Patient is accompained by:  Family member    Patient Stated Goals  Pt's goal for therapy is to be able to work around the house more (with less fatigue).  To improve walking and stop dragging R foot.    Currently in Pain?  No/denies                       [facility] Adult PT Treatment/Exercise - 01/17/18 1552      Ambulation/Gait   Ambulation/Gait  Yes    Ambulation/Gait Assistance  5: Supervision    Ambulation/Gait Assistance Details  Occasional foot drag noted with gait, decreased foot clearance and decreased R dorsiflexion    Ambulation Distance (Feet)  100 Feet   then 200 ft x 2   Assistive device  None    Gait Pattern  Step-through pattern;Decreased step length - right;Decreased step length - left;Decreased stride length;Poor foot clearance - right    Ambulation Surface  Level;Indoor      Standardized Balance Assessment   Standardized Balance Assessment  Dynamic Gait Index      Dynamic Gait Index   Level Surface  Mild Impairment    Change in Gait Speed  Normal    Gait with Horizontal Head Turns  Mild Impairment    Gait with Vertical Head Turns  Normal    Gait and Pivot Turn  Normal    Step Over Obstacle  Mild Impairment    Step Around Obstacles  Normal    Steps  Mild Impairment    Total Score  20      Exercises   Exercises  Knee/Hip      Knee/Hip Exercises: Aerobic   Stepper  seated SciFit Stepper, Level 1.4, 4 extremities x 5 minutes, cues to keep feet fully placed on footrests, for leg strengthening       Knee/Hip Exercises: Standing   Heel Raises  Both;2 sets;10 reps   then toe raises, 2 sets x 10 reps   Hip Flexion  Stengthening;Right;Left;2 sets;10 reps    Hip Flexion Limitations  cues to raise RLE as high as LLE for improved leg clearance and strengthening    Hip Abduction  Stengthening;Right;Left;10 reps;2 sets   cues for positioning to avoid hip external rotation   Lateral Step Up  Both;1 set;10 reps;Hand Hold: 0;Step Height: 6"    Forward Step Up  Both;1 set;10 reps;Hand Hold: 0;Step Height: 6"    Other Standing Knee Exercises  Side stepping with counter for support, 1 set/4reps.   Review of exercises as part of HEP     Knee/Hip Exercises: Seated   Long Arc Quad  AROM;Strengthening;Both;1 set;10 reps    Sit to [employer]  1 set;10 reps;without UE support      Knee/Hip Exercises: Supine   Bridges  AROM;Strengthening;Both;1 set;10 reps   Cues for 3 second hold  each rep     Knee/Hip Exercises: Sidelying   Hip ABduction  AROM;Strengthening;Both;1 set;10 reps   Cues for positioning and slowed pace of exercise            PT Education - 01/17/18 1601    Education Details  Additions to HEP, POC and plans to continue towards LTGs    Person(s) Educated  Patient    Methods  Explanation;Demonstration;Handout    Comprehension  Verbalized understanding;Returned demonstration;Verbal cues required       PT Short Term Goals - 01/17/18 1256      PT SHORT TERM GOAL #1   Title  Pt will perform HEP with wife's supervision for improved strength, balance, gait.  TARGET 01/20/18    Baseline  11/22: Pt able to perform HEP independently.     Time  5    Period  Weeks    Status  Achieved      PT SHORT TERM GOAL #2   Title  Pt will improve 5x sit<>stand to less than or equal to 18 seconds for improved transfer efficiency and safety.    Baseline  11/22: 14.60 secs with no UE support required.     Time  5    Period  Weeks    Status  Achieved      PT SHORT TERM GOAL #3   Title  Pt will improve TUG score to less than or equal to 13.5 seconds for decreased fall risk.    Baseline  11/22: 12.03 secs    Time  5    Period  Weeks    Status  Achieved      PT SHORT TERM GOAL #4   Title  Pt will improve DGI score to at least 18/24 for decreased fall risk.    Baseline  01/16/18:  20/24    Time  5    Period  Weeks    Status  Achieved      PT SHORT TERM GOAL #5   Title  Pt/wife will verbalize understanding of fall prevention in home environment.    Baseline  11/22: Pt verbalizes understanding of fall prevention in home.     Time  5    Period  Weeks    Status  Achieved        PT Long Term Goals - 01/17/18 1606  PT LONG TERM GOAL #1   Title  Pt will be independent with progression of HEP for improved strength, balance, gait.  TARGET 02/17/18    Time  9    Period  Weeks    Status  On-going    Target Date  02/17/18      PT LONG TERM GOAL #2    Title  Pt will improve gait velocity to at least 2.62 ft/sec for decreased fall risk, improved gait efficiency.    Time  9    Period  Weeks    Status  On-going      PT LONG TERM GOAL #3   Title  Pt will improve DGI 20/24 for decreased fall risk.    Time  9    Period  Weeks    Status  Achieved      PT LONG TERM GOAL #4   Title  Pt will ambulate at least 1000 ft, indoors and outdoor surfaces, independently for improved outdoor gait efficiency and safety.    Time  9    Period  Weeks    Status  On-going      PT LONG TERM GOAL #5   Title  Pt will perform floor>stand transfer with UE support, modified independently, for improved fall recovery.    Time  9    Period  Weeks    Status  On-going            Plan - 01/17/18 1603    Clinical Impression Statement  Pt has met DGI goal, with DGI score 20/24, improved from 15/24 at eval.  While pt is improving with gait and mobility, he continues to demo decreased RLE strength with decreased R foot clearance and dorsiflexion with gait.  Pt will benefit from further skilled PT to work towards LTGs, addressing strength, balance, and gait.    Rehab Potential  Good    PT Frequency  2x / week    PT Duration  Other (comment)   9 weeks   PT Treatment/Interventions  ADLs/Self Care Home Management;DME Instruction;Therapeutic exercise;Balance training;Therapeutic activities;Functional mobility training;Stair training;Gait training;Neuromuscular re-education;Patient/family education    PT Next Visit Plan  Review HEP given 01/16/18, R hip strengthening with standing, RLE strengthening, standing balance/gait activities); gait for improved R foot clearance/heelstrike, gait with obstacles/hurdles/compliant surfaces.     PT Home Exercise Plan  Access Code: [email or web address]     Consulted and Agree with Plan of Care  Patient       Patient will benefit from skilled therapeutic intervention in order to improve the following deficits and impairments:  Abnormal gait,  Decreased activity tolerance, Decreased balance, Decreased coordination, Decreased mobility, Decreased strength, Difficulty walking  Visit Diagnosis: Other abnormalities of gait and mobility  Muscle weakness (generalized)  Unsteadiness on feet     Problem List Patient Active Problem List   Diagnosis Date Noted  . Acute CVA (cerebrovascular accident) ([place]) 09/18/2017  . 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 Diabetes mellitus without complication ([place]) 76/73/4193  . Closed head injury 04/22/2013  . HLD (hyperlipidemia) 07/29/2009  . Essential hypertension 07/29/2009  . EFFUSION, PLEURAL 07/29/2009  . DIVERTICULITIS, COLON 07/29/2009  . 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 COLONIC POLYPS, HYPERPLASTIC, HX OF 07/29/2009    Coleen Cardiff W. 01/17/2018, 4:07 PM Frazier Butt., PT  [facility] [address] [place], [place], [address] Phone: [phone]   Fax:  [phone]  Name: Justin [name] MRN: [id number] Date of Birth: 15-Oct-1940  Progress NOTE (performed at 9th visit)   Reporting dates:  Pt seen 12/15/17-01/16/18  Status Update:  Pt has met all 5 LTGs, improving transfers and mobility measures.  He continues to have RLE weakness, which is seen with decreased foot clearance, Trendelenburg gait on RLE.  Pt will benefit from further skilled PT to address strength, balance, transfers, and gait for improved functional mobility and decreased falls.  Plan:  Continue per POC, additional 2x/wk for 4 weeks towards LTGs for improved functional mobility and independence.   Mady Haagensen, PT 01/17/18 4:11 PM Phone: [phone] Fax: [phone]

## 2018-01-24 ENCOUNTER — Encounter: Payer: Self-pay | Admitting: Physical Therapy

## 2018-01-24 ENCOUNTER — Ambulatory Visit: Payer: Medicare HMO | Attending: Physician Assistant | Admitting: Physical Therapy

## 2018-01-24 DIAGNOSIS — R2689 Other abnormalities of gait and mobility: Secondary | ICD-10-CM | POA: Diagnosis present

## 2018-01-24 DIAGNOSIS — M6281 Muscle weakness (generalized): Secondary | ICD-10-CM | POA: Diagnosis present

## 2018-01-24 DIAGNOSIS — R2681 Unsteadiness on feet: Secondary | ICD-10-CM | POA: Insufficient documentation

## 2018-01-24 NOTE — Therapy (Signed)
[facility] [address] [place], [place], [address] Phone: [phone]   Fax:  [phone]  Physical Therapy Treatment  Patient Details  Name: Justin [name] MRN: [id number] Date of Birth: 1941-01-22 Referring Provider (PT): Roe Coombs   Encounter Date: 01/24/2018  PT End of Session - 01/24/18 2002    Visit Number  10    Number of Visits  18    Date for PT Re-Evaluation  03/15/18    Authorization Type  [employer] Medicare   PN completed at 9th visit on 01/16/18   Authorization Time Period  Medicare cert period:  02/58/52-7/78/24    PT Start Time  1148    PT Stop Time  1230    PT Time Calculation (min)  42 min    Equipment Utilized During Treatment  Gait belt    Activity Tolerance  Patient tolerated treatment well    Behavior During Therapy  [facility] for tasks assessed/performed       Past Medical History:  Diagnosis Date  . Brain bleed ([place])   . Cancer [facility])    prostate  . Diabetes mellitus without complication ([place])   . High cholesterol   . Hypertension   . Stroke [facility])     Past Surgical History:  Procedure Laterality Date  . arm & hand surgery Left 1972   skill saw accident  . COLON SURGERY     partial  . EYE SURGERY     lens implant and cateract- Left  . PROSTATECTOMY      There were no vitals filed for this visit.  Subjective Assessment - 01/24/18 1151    Subjective  No falls, no changes since last visit.  Been doing exercises.    Patient is accompained by:  Family member    Patient Stated Goals  Pt's goal for therapy is to be able to work around the house more (with less fatigue).  To improve walking and stop dragging R foot.    Currently in Pain?  No/denies                       [facility] Adult PT Treatment/Exercise - 01/24/18 0001      Ambulation/Gait   Ambulation/Gait  Yes    Ambulation/Gait Assistance  5: Supervision    Ambulation/Gait Assistance Details  Pt needs frequent  cues for increased R foot clearance, R heelstrike, as pt tends to revert to decreased R foot clearance, shuffling type pattern on RLE.  Cues (and trial of walking poles to facilitate arm swing) for increased, even step length, increased RLE stance time.    Ambulation Distance (Feet)  120 Feet   x 2, 200 ft x 2   Assistive device  None    Gait Pattern  Step-through pattern;Decreased step length - right;Decreased step length - left;Decreased stride length;Poor foot clearance - right    Ambulation Surface  Level;Indoor    Gait velocity  12.79 sec = 2.56 ft/sec   11.75 sec = 2.79 ft/sec         Balance Exercises - 01/24/18 1203      Balance Exercises: Standing   Retro Gait  Foam/compliant surface;5 reps   Forward/back walking on compliant mat surface   Marching Limitations  Forward marching along compliant surface, 4 reps   Reviewed marching in place x 10 reps, 2 sets   Heel Raises Limitations  x 10 reps    Toe Raise Limitations  x 10 reps  Other Standing Exercises  Forward step and weightshift x 10, then forward>back step and weightshift x 10, then forward step over hurdles x 10, to address heelstrike, R foot clearance.  Side step and weightshift over hurdles x 10 each with cues for increased step height and length.  Also on foam mat:  forward step downs x 10 reps, then forward>back step and weightshift x 10 reps with cues for increased foot clearance.     Forward step taps to 6", 12" step, 10 reps each leg, then forward step up/up, down/down x 10 reps each leg with bilateral UE support.  Cues for increased foot clearance to and from step.     PT Short Term Goals - 01/17/18 1256      PT SHORT TERM GOAL #1   Title  Pt will perform HEP with wife's supervision for improved strength, balance, gait.  TARGET 01/20/18    Baseline  11/22: Pt able to perform HEP independently.     Time  5    Period  Weeks    Status  Achieved      PT SHORT TERM GOAL #2   Title  Pt will improve 5x  sit<>stand to less than or equal to 18 seconds for improved transfer efficiency and safety.    Baseline  11/22: 14.60 secs with no UE support required.     Time  5    Period  Weeks    Status  Achieved      PT SHORT TERM GOAL #3   Title  Pt will improve TUG score to less than or equal to 13.5 seconds for decreased fall risk.    Baseline  11/22: 12.03 secs    Time  5    Period  Weeks    Status  Achieved      PT SHORT TERM GOAL #4   Title  Pt will improve DGI score to at least 18/24 for decreased fall risk.    Baseline  01/16/18:  20/24    Time  5    Period  Weeks    Status  Achieved      PT SHORT TERM GOAL #5   Title  Pt/wife will verbalize understanding of fall prevention in home environment.    Baseline  11/22: Pt verbalizes understanding of fall prevention in home.     Time  5    Period  Weeks    Status  Achieved        PT Long Term Goals - 01/17/18 1606      PT LONG TERM GOAL #1   Title  Pt will be independent with progression of HEP for improved strength, balance, gait.  TARGET 02/17/18    Time  9    Period  Weeks    Status  On-going    Target Date  02/17/18      PT LONG TERM GOAL #2   Title  Pt will improve gait velocity to at least 2.62 ft/sec for decreased fall risk, improved gait efficiency.    Time  9    Period  Weeks    Status  On-going      PT LONG TERM GOAL #3   Title  Pt will improve DGI 20/24 for decreased fall risk.    Time  9    Period  Weeks    Status  Achieved      PT LONG TERM GOAL #4   Title  Pt will ambulate at least 1000 ft, indoors and outdoor  surfaces, independently for improved outdoor gait efficiency and safety.    Time  9    Period  Weeks    Status  On-going      PT LONG TERM GOAL #5   Title  Pt will perform floor>stand transfer with UE support, modified independently, for improved fall recovery.    Time  9    Period  Weeks    Status  On-going            Plan - 01/24/18 2003    Clinical Impression Statement  Skilled PT  session focused on work to improve hip flexion and ankle dorsiflexion on RLE for improved step length and foot clearance with gait, working on solid and foam surfaces.  With gait, pt needs frequent cues to improve R foot clearance and step length, and little carryover noted from exercises and cues to independent gait with improved foot clearance/step length.  Pt feels this is very close to his baseline and is in agreement with completion of PT at last scheduled appointment, which is later this week.    Rehab Potential  Good    PT Frequency  2x / week    PT Duration  Other (comment)   9 weeks   PT Treatment/Interventions  ADLs/Self Care Home Management;DME Instruction;Therapeutic exercise;Balance training;Therapeutic activities;Functional mobility training;Stair training;Gait training;Neuromuscular re-education;Patient/family education    PT Next Visit Plan  Check LTGs and plan for d/c next visit    PT Home Exercise Plan  Access Code: [email or web address]     Consulted and Agree with Plan of Care  Patient       Patient will benefit from skilled therapeutic intervention in order to improve the following deficits and impairments:  Abnormal gait, Decreased activity tolerance, Decreased balance, Decreased coordination, Decreased mobility, Decreased strength, Difficulty walking  Visit Diagnosis: Other abnormalities of gait and mobility  Muscle weakness (generalized)     Problem List Patient Active Problem List   Diagnosis Date Noted  . Acute CVA (cerebrovascular accident) ([place]) 09/18/2017  . 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 Diabetes mellitus without complication ([place]) 16/11/9602  . Closed head injury 04/22/2013  . HLD (hyperlipidemia) 07/29/2009  . Essential hypertension 07/29/2009  . EFFUSION, PLEURAL 07/29/2009  . DIVERTICULITIS, COLON 07/29/2009  . 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 COLONIC POLYPS, HYPERPLASTIC, HX OF 07/29/2009    Ngina Royer W. 01/24/2018, 8:06 PM  Frazier Butt.,  PT   [facility] [address] [place], [place], [address] Phone: [phone]   Fax:  [phone]  Name: KAMILO OCH MRN: [id number] Date of Birth: [birth year]

## 2018-01-27 ENCOUNTER — Ambulatory Visit: Payer: Medicare HMO | Admitting: Physical Therapy

## 2018-01-27 ENCOUNTER — Encounter: Payer: Self-pay | Admitting: Neurology

## 2018-01-27 DIAGNOSIS — R2689 Other abnormalities of gait and mobility: Secondary | ICD-10-CM | POA: Diagnosis not present

## 2018-01-27 NOTE — Progress Notes (Signed)
Patient referred by Gertie Gowda, NP, seen by me on 11/09/17, diagnostic PSG on 01/17/18.   Please call and notify the patient that the recent sleep study showed mild to moderate obstructive sleep apnea. I recommend treatment for this in the form of CPAP. This will require a repeat sleep study for proper titration and mask fitting and correct monitoring of the oxygen saturations. Please explain to patient. I have placed an order in the chart. Thanks.  Star Age, MD, PhD [facility])

## 2018-01-27 NOTE — Addendum Note (Signed)
Addended by: Star Age on: 01/27/2018 12:41 PM   Modules accepted: Orders

## 2018-01-27 NOTE — Procedures (Signed)
PATIENT'S NAME:  Justin [name], Peed DOB:      02/12/1941      MR#:    [id number]     DATE OF RECORDING: 01/17/2018 REFERRING M.D.:  Venancio Poisson, NP Study Performed:   Baseline Polysomnogram HISTORY: 77 year old man with a history of hypertension, prostate cancer, diabetes, hyperlipidemia, left basal ganglia stroke in July 2019, and obesity, who reports snoring and excessive daytime somnolence. The patient endorsed the Epworth Sleepiness Scale at 24 points. The patient's weight 227 pounds with a height of 70 (inches), resulting in a BMI of 32.5 kg/m2. The patient's neck circumference measured 18.5 inches.  CURRENT MEDICATIONS: Aspirin, Plavix, Lasix, Glipizide, Hydrodiuril, Cozaar, Metformin, Ozempic   PROCEDURE:  This is a multichannel digital polysomnogram utilizing the Somnostar 11.2 system.  Electrodes and sensors were applied and monitored per AASM Specifications.   EEG, EOG, Chin and Limb EMG, were sampled at 200 Hz.  ECG, Snore and Nasal Pressure, Thermal Airflow, Respiratory Effort, CPAP Flow and Pressure, Oximetry was sampled at 50 Hz. Digital video and audio were recorded.      BASELINE STUDY  Lights Out was at 22:50 and Lights On at 04:59.  Total recording time (TRT) was 369 minutes, with a total sleep time (TST) of 140.5 minutes.   The patient's sleep latency was 93.5 minutes, which is delayed. REM latency was 107.5 minutes. The sleep efficiency was 38.1%, which is markedly reduced.     SLEEP ARCHITECTURE: WASO (Wake after sleep onset) was 112.5 minutes and inability to go back to sleep after 02:45 AM. There were 20 minutes in Stage N1, 29 minutes Stage N2, 66 minutes Stage N3 and 25.5 minutes in Stage REM.  The percentage of Stage N1 was 14.2%, which is increased, Stage N2 was 20.6%, Stage N3 was 47% and Stage R (REM sleep) was 18.1%, which is near normal. The arousals were noted as: 40 were spontaneous, 2 were associated with PLMs, 54 were associated with respiratory  events.  RESPIRATORY ANALYSIS:  There were a total of 19 respiratory events:  8 obstructive apneas, 0 central apneas and 0 mixed apneas with a total of 8 apneas and an apnea index (AI) of 3.4 /hour. There were 11 hypopneas with a hypopnea index of 4.7 /hour. The patient also had 42 respiratory event related arousals (RERAs).      The total APNEA/HYPOPNEA INDEX (AHI) was 8.1 /hour and the total RESPIRATORY DISTURBANCE INDEX was 26. /hour.  11 events occurred in REM sleep and 10 events in NREM. The REM AHI was 25.9 /hour, versus a non-REM AHI of 4.2. The patient spent 140.5 minutes of total sleep time in the supine position and 0 minutes in non-supine.. The supine AHI was 8.1 versus a non-supine AHI of 0.0.  OXYGEN SATURATION & C02:  The Wake baseline 02 saturation was 97%, with the lowest being 83%. Time spent below 89% saturation equaled 11 minutes.  PERIODIC LIMB MOVEMENTS:   The patient had a total of 5 Periodic Limb Movements.  The Periodic Limb Movement (PLM) index was 2.1 and the PLM Arousal index was .9/hour.  Audio and video analysis did not show any abnormal or unusual movements, behaviors, phonations or vocalizations. The patient took 1 bathroom break. Moderate snoring was noted. The EKG was in keeping with normal sinus rhythm (NSR).  Post-study, the patient indicated that sleep was worse than usual.   IMPRESSION:  1. Obstructive Sleep Apnea (OSA) 2. Dysfunctions associated with sleep stages or arousal from sleep  RECOMMENDATIONS:  1. This  study demonstrates overall mild obstructive sleep apnea, moderate in REM sleep with a total AHI of 8.1/hour, REM AHI of 25.9/hour, and O2 nadir of 83%. Given the patient's medical history, including recent stroke, and sleep related complaints, treatment with positive airway pressure is recommended; a full-night CPAP titration study is recommended to optimize therapy. Other treatment options may include avoidance of supine sleep position along with  weight loss, upper airway or jaw surgery in selected patients or the use of an oral appliance in certain patients. ENT evaluation and/or consultation with a maxillofacial surgeon or dentist may be feasible in some instances.    2. Please note that untreated obstructive sleep apnea may carry additional perioperative morbidity. Patients with significant obstructive sleep apnea (typically, in the moderate to severe degree) should receive, if possible, perioperative PAP (positive airway pressure) therapy and the surgeons and particularly the anesthesiologists should be informed of the diagnosis and the severity of the sleep disordered breathing. If feasible, the patient may be asked to bring his/her CPAP or BiPAP machine for a planned/elective surgery.  3. This study shows sleep fragmentation, a significantly reduced sleep efficiency and abnormal sleep stage percentages; these are nonspecific findings and per se do not signify an intrinsic sleep disorder or a cause for the patient's sleep-related symptoms. Causes include (but are not limited to) the first night effect of the sleep study, circadian rhythm disturbances, medication effect or an underlying mood disorder or medical problem.  4. The patient should be cautioned not to drive, work at heights, or operate dangerous or heavy equipment when tired or sleepy. Review and reiteration of good sleep hygiene measures should be pursued with any patient. 5. The patient will be seen in follow-up in the sleep clinic at [facility] for discussion of the test results, symptom and treatment compliance review, further management strategies, etc. The referring provider will be notified of the test results.  I certify that I have reviewed the entire raw data recording prior to the issuance of this report in accordance with the Standards of Accreditation of the American Academy of Sleep Medicine (AASM)  Star Age, MD, PhD Diplomat, American Board of Neurology and Sleep Medicine  (Neurology and Sleep Medicine)

## 2018-01-28 ENCOUNTER — Other Ambulatory Visit: Payer: Self-pay | Admitting: Adult Health

## 2018-01-29 ENCOUNTER — Encounter: Payer: Self-pay | Admitting: Physical Therapy

## 2018-01-29 NOTE — Therapy (Signed)
[facility] [address] [place], [place], [address] Phone: [phone]   Fax:  [phone]  Physical Therapy Treatment  Patient Details  Name: Justin [name] MRN: [id number] Date of Birth: 10/09/1940 Referring Provider (PT): Roe Coombs   Encounter Date: 01/27/2018  PT End of Session - 01/29/18 2046    Visit Number  11    Number of Visits  18    Date for PT Re-Evaluation  03/15/18    Authorization Type  [employer]   PN completed at 9th visit on 01/16/18   Authorization Time Period  Medicare cert period:  26/94/85-4/62/70    PT Start Time  0933    PT Stop Time  1015    PT Time Calculation (min)  42 min    Equipment Utilized During Treatment  Gait belt    Activity Tolerance  Patient tolerated treatment well    Behavior During Therapy  [facility] for tasks assessed/performed       Past Medical History:  Diagnosis Date  . Brain bleed ([place])   . Cancer [facility])    prostate  . Diabetes mellitus without complication ([place])   . High cholesterol   . Hypertension   . Stroke [facility])     Past Surgical History:  Procedure Laterality Date  . arm & hand surgery Left 1972   skill saw accident  . COLON SURGERY     partial  . EYE SURGERY     lens implant and cateract- Left  . PROSTATECTOMY      There were no vitals filed for this visit.  Subjective Assessment - 01/29/18 2033    Subjective  Wife present for most of the session.  Reports he is still dragging his leg quite a bit, he is not doing exercises form therapy.  Based on pt's comment last visit about being near baseline, wife reports he did not drag leg before.    Patient is accompained by:  Family member    Patient Stated Goals  Pt's goal for therapy is to be able to work around the house more (with less fatigue).  To improve walking and stop dragging R foot.    Currently in Pain?  No/denies                       [facility] Adult PT Treatment/Exercise -  01/29/18 0001      Ambulation/Gait   Ambulation/Gait  Yes    Ambulation/Gait Assistance  5: Supervision    Ambulation/Gait Assistance Details  Cues for R foot clearance with gait; gait trial with and without foot-up brace.  Gait trial with cane to help with foot clearance.    Ambulation Distance (Feet)  200 Feet   x 2, with foot up brace; then with cane x 200 ft    Assistive device  None;Straight cane    Gait Pattern  Step-through pattern;Decreased step length - right;Decreased step length - left;Decreased stride length;Poor foot clearance - right    Ambulation Surface  Level;Indoor    Gait Comments  Gait trial using foot-up brace on RLE, with pt noting improved R foot clearance (compared to when foot-up disconnected.)  With fatigue and distractions, pt does continue to need occasional cues for R foot clearance, even with foot-up brace.  Initial trial with cane, with cues for placement and sequence.  Pt gets easily distacted and off-sequence.      Self-Care   Self-Care  Other Self-Care Comments  Other Self-Care Comments   Discussed POC, including pt's goals (from subjective portion of eval).  Given wife's description that pt not performing HEP at home consistently, PT discussed benefit of consistent HEP performance, and given wife's report that he was not dragging foot at baseline; discussed continuing PT sessions for complete POC (adding at least 2 more weeks of appts).  Pt in agreement.               PT Education - 01/29/18 2045    Education Details  Importance of HEP performance conssitently, POC and discussion with pt/wife to continue POC towards LTGs; discussed foot-up brace as option for gait for foot clearance    Person(s) Educated  Patient;Spouse    Methods  Explanation;Demonstration;Verbal cues;Handout    Comprehension  Verbalized understanding;Returned demonstration;Verbal cues required       PT Short Term Goals - 01/17/18 1256      PT SHORT TERM GOAL #1   Title  Pt  will perform HEP with wife's supervision for improved strength, balance, gait.  TARGET 01/20/18    Baseline  11/22: Pt able to perform HEP independently.     Time  5    Period  Weeks    Status  Achieved      PT SHORT TERM GOAL #2   Title  Pt will improve 5x sit<>stand to less than or equal to 18 seconds for improved transfer efficiency and safety.    Baseline  11/22: 14.60 secs with no UE support required.     Time  5    Period  Weeks    Status  Achieved      PT SHORT TERM GOAL #3   Title  Pt will improve TUG score to less than or equal to 13.5 seconds for decreased fall risk.    Baseline  11/22: 12.03 secs    Time  5    Period  Weeks    Status  Achieved      PT SHORT TERM GOAL #4   Title  Pt will improve DGI score to at least 18/24 for decreased fall risk.    Baseline  01/16/18:  20/24    Time  5    Period  Weeks    Status  Achieved      PT SHORT TERM GOAL #5   Title  Pt/wife will verbalize understanding of fall prevention in home environment.    Baseline  11/22: Pt verbalizes understanding of fall prevention in home.     Time  5    Period  Weeks    Status  Achieved        PT Long Term Goals - 01/17/18 1606      PT LONG TERM GOAL #1   Title  Pt will be independent with progression of HEP for improved strength, balance, gait.  TARGET 02/17/18    Time  9    Period  Weeks    Status  On-going    Target Date  02/17/18      PT LONG TERM GOAL #2   Title  Pt will improve gait velocity to at least 2.62 ft/sec for decreased fall risk, improved gait efficiency.    Time  9    Period  Weeks    Status  On-going      PT LONG TERM GOAL #3   Title  Pt will improve DGI 20/24 for decreased fall risk.    Time  9    Period  Weeks  Status  Achieved      PT LONG TERM GOAL #4   Title  Pt will ambulate at least 1000 ft, indoors and outdoor surfaces, independently for improved outdoor gait efficiency and safety.    Time  9    Period  Weeks    Status  On-going      PT LONG  TERM GOAL #5   Title  Pt will perform floor>stand transfer with UE support, modified independently, for improved fall recovery.    Time  9    Period  Weeks    Status  On-going            Plan - 01/29/18 2048    Clinical Impression Statement  While pt and PT had previously discussed possible discharge this visit, wife joined pt during PT session and reported that pt has not been doing HEP, is continuing to get fatigued wtih household activities; also she reports that he was not dragging his feet prior to CVA.  Pt and wife agree with PT to continue POC towards LTGs to work ultimately towards pt's goals of improving walking and not dragging R foot.  During session, pt trialed foot-up brace and cane to assist with foot clearance.  Foot-up brace does seem effective at more consistent foot clearance; however, when pt is distracted or becomes fatigued, he reverts back to decreased foot clearance.  Pt will benefit from continued therapy to work towards [place], with plans to check/modify HEP and add walking program for home.    Rehab Potential  Good    PT Frequency  2x / week    PT Duration  Other (comment)   9 weeks   PT Treatment/Interventions  ADLs/Self Care Home Management;DME Instruction;Therapeutic exercise;Balance training;Therapeutic activities;Functional mobility training;Stair training;Gait training;Neuromuscular re-education;Patient/family education    PT Next Visit Plan  Review HEP and modify as needed; add walking program to HEP; discuss foot up/further gait training with foot-up and/or cane    PT Home Exercise Plan  Access Code: [email or web address]     Consulted and Agree with Plan of Care  Patient;Family member/caregiver       Patient will benefit from skilled therapeutic intervention in order to improve the following deficits and impairments:  Abnormal gait, Decreased activity tolerance, Decreased balance, Decreased coordination, Decreased mobility, Decreased strength, Difficulty walking  Visit  Diagnosis: Other abnormalities of gait and mobility     Problem List Patient Active Problem List   Diagnosis Date Noted  . Acute CVA (cerebrovascular accident) ([place]) 09/18/2017  . 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 Diabetes mellitus without complication ([place]) 83/41/9622  . Closed head injury 04/22/2013  . HLD (hyperlipidemia) 07/29/2009  . Essential hypertension 07/29/2009  . EFFUSION, PLEURAL 07/29/2009  . DIVERTICULITIS, COLON 07/29/2009  . 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 COLONIC POLYPS, HYPERPLASTIC, HX OF 07/29/2009    Loriel Diehl W. 01/29/2018, 8:57 PM  Frazier Butt., PT   [facility] [address] [place], [place], [address] Phone: [phone]   Fax:  [phone]  Name: GLENDA KUNST MRN: [id number] Date of Birth: [birth year]

## 2018-01-30 ENCOUNTER — Telehealth: Payer: Self-pay

## 2018-01-30 NOTE — Telephone Encounter (Signed)
-----   Message from Star Age, MD sent at 01/27/2018 12:41 PM EST ----- Patient referred by Gertie Gowda, NP, seen by me on 11/09/17, diagnostic PSG on 01/17/18.   Please call and notify the patient that the recent sleep study showed mild to moderate obstructive sleep apnea. I recommend treatment for this in the form of CPAP. This will require a repeat sleep study for proper titration and mask fitting and correct monitoring of the oxygen saturations. Please explain to patient. I have placed an order in the chart. Thanks.  Star Age, MD, PhD [facility])

## 2018-01-30 NOTE — Telephone Encounter (Signed)
I called pt. I advised pt that Dr. Rexene Alberts reviewed their sleep study results and found that pt has mild to moderate osa and recommends that pt be treated with a cpap. Dr. Rexene Alberts recommends that pt return for a repeat sleep study in order to properly titrate the cpap and ensure a good mask fit. Pt is agreeable to returning for a titration study. I advised pt that our sleep lab will file with pt's insurance and call pt to schedule the sleep study when we hear back from the pt's insurance regarding coverage of this sleep study. Pt verbalized understanding of results. Pt had no questions at this time but was encouraged to call back if questions arise.

## 2018-02-06 ENCOUNTER — Encounter: Payer: Self-pay | Admitting: Physical Therapy

## 2018-02-06 ENCOUNTER — Ambulatory Visit: Payer: Medicare HMO | Admitting: Physical Therapy

## 2018-02-06 DIAGNOSIS — R2689 Other abnormalities of gait and mobility: Secondary | ICD-10-CM | POA: Diagnosis not present

## 2018-02-06 DIAGNOSIS — M6281 Muscle weakness (generalized): Secondary | ICD-10-CM

## 2018-02-06 DIAGNOSIS — R2681 Unsteadiness on feet: Secondary | ICD-10-CM

## 2018-02-06 NOTE — Patient Instructions (Signed)
Access Code: [email or web address]  URL: [email or web address]  Date: 02/06/2018  Prepared by: Mady Haagensen   Exercises  Supine Bridge - 10 reps - 3 sets - 2x daily - 6x weekly  Sidelying Hip Abduction - 10 reps - 1-3 sets - 2x daily - 6x weekly  Seated Long Arc Quad - 10 reps - 2-3 sets - 2x daily - 6x weekly  Sit to Stand without Arm Support - 10 reps - 1 sets - 2x daily - 6x weekly  Step Up - 10 reps - 3 sets - 2x daily - 6x weekly  Lateral Step Up - 10 reps - 3 sets - 2x daily - 6x weekly  Side Stepping with Counter Support - 3-5 reps - 1 sets - 2x daily - 6x weekly  March in Place - 10 reps - 2-3 sets - 1x daily - 6x weekly  Heel Toe Raises with Counter Support - 10 reps - 2-3 sets - 3 sec hold - 1x daily - 6x weekly   Added 02/06/18 Single Leg Stance with Support - 3 reps - 1 sets - 10 sec hold - 1x daily - 6x weekly  Standing Tandem Balance with Counter Support - 3 reps - 1 sets - 10 sec hold - 1x daily - 6x weekly

## 2018-02-07 NOTE — Therapy (Signed)
[facility] [address] [place], [place], [address] Phone: [phone]   Fax:  [phone]  Physical Therapy Treatment  Patient Details  Name: Justin [name] MRN: [id number] Date of Birth: Jul 16, 1940 Referring Provider (PT): Roe Coombs   Encounter Date: 02/06/2018  PT End of Session - 02/07/18 0914    Visit Number  12    Number of Visits  18    Date for PT Re-Evaluation  03/15/18    Authorization Type  [employer]   PN completed at 9th visit on 01/16/18   Authorization Time Period  Medicare cert period:  13/24/40-02/23/70    PT Start Time  1018    PT Stop Time  1059    PT Time Calculation (min)  41 min    Equipment Utilized During Treatment  Gait belt    Activity Tolerance  Patient tolerated treatment well    Behavior During Therapy  [facility] for tasks assessed/performed       Past Medical History:  Diagnosis Date  . Brain bleed ([place])   . Cancer [facility])    prostate  . Diabetes mellitus without complication ([place])   . High cholesterol   . Hypertension   . Stroke [facility])     Past Surgical History:  Procedure Laterality Date  . arm & hand surgery Left 1972   skill saw accident  . COLON SURGERY     partial  . EYE SURGERY     lens implant and cateract- Left  . PROSTATECTOMY      There were no vitals filed for this visit.  Subjective Assessment - 02/06/18 1021    Subjective  No changes; had an x-ray of my back last week.  Probably to get a shot in my back, but no pain today.    Patient is accompained by:  Family member    Patient Stated Goals  Pt's goal for therapy is to be able to work around the house more (with less fatigue).  To improve walking and stop dragging R foot.    Currently in Pain?  No/denies                       [facility] Adult PT Treatment/Exercise - 02/07/18 0001      Ambulation/Gait   Ambulation/Gait  Yes    Ambulation/Gait Assistance  5: Supervision    Ambulation/Gait  Assistance Details  Cues for R foot clearance.  Pt initially improves step length, foot clearance, but reverts to R foot shuffling after approx 50 ft.  PT cues patient to stop and to start again with BIG step length.  Cues to slow pace of gait.    Ambulation Distance (Feet)  200 Feet   x 2; 50 ft x 2   Assistive device  None    Gait Pattern  Step-through pattern;Decreased step length - right;Decreased step length - left;Decreased stride length;Poor foot clearance - right    Ambulation Surface  Level;Indoor        Reviewed full HEP:  Supine Bridge - 10 reps  Sidelying Hip Abduction-10 reps  Seated Long Arc Quad -10 reps Sit to Stand without Arm Support-10 reps Step Up - 10 reps  Lateral Step Up - 10 reps  Side Stepping with Counter Support - 3-5 reps  March in Place - 10 reps  Heel Toe Raises with Counter Support - 10 reps   Pt return demo understanding, but does need cues for slowed pace of exercises,  especially bridging and hip abduction Balance Exercises - 02/06/18 1046      Balance Exercises: Standing   Tandem Stance  Eyes open;Upper extremity support 1;3 reps;10 secs    SLS  Eyes open;Solid surface;Upper extremity support 1;3 reps;10 secs    Marching Limitations  Forward marching along parallel bars 3 reps forward and back    Other Standing Exercises  Heel walking, toe walking, 3 reps each along parallel bars        PT Education - 02/07/18 0914    Education Details  Updates to HEP-see instructions    Person(s) Educated  Patient    Methods  Explanation;Demonstration;Handout    Comprehension  Verbalized understanding;Returned demonstration;Verbal cues required       PT Short Term Goals - 01/17/18 1256      PT SHORT TERM GOAL #1   Title  Pt will perform HEP with wife's supervision for improved strength, balance, gait.  TARGET 01/20/18    Baseline  11/22: Pt able to perform HEP independently.     Time  5    Period  Weeks    Status  Achieved      PT SHORT TERM GOAL #2    Title  Pt will improve 5x sit<>stand to less than or equal to 18 seconds for improved transfer efficiency and safety.    Baseline  11/22: 14.60 secs with no UE support required.     Time  5    Period  Weeks    Status  Achieved      PT SHORT TERM GOAL #3   Title  Pt will improve TUG score to less than or equal to 13.5 seconds for decreased fall risk.    Baseline  11/22: 12.03 secs    Time  5    Period  Weeks    Status  Achieved      PT SHORT TERM GOAL #4   Title  Pt will improve DGI score to at least 18/24 for decreased fall risk.    Baseline  01/16/18:  20/24    Time  5    Period  Weeks    Status  Achieved      PT SHORT TERM GOAL #5   Title  Pt/wife will verbalize understanding of fall prevention in home environment.    Baseline  11/22: Pt verbalizes understanding of fall prevention in home.     Time  5    Period  Weeks    Status  Achieved        PT Long Term Goals - 01/17/18 1606      PT LONG TERM GOAL #1   Title  Pt will be independent with progression of HEP for improved strength, balance, gait.  TARGET 02/17/18    Time  9    Period  Weeks    Status  On-going    Target Date  02/17/18      PT LONG TERM GOAL #2   Title  Pt will improve gait velocity to at least 2.62 ft/sec for decreased fall risk, improved gait efficiency.    Time  9    Period  Weeks    Status  On-going      PT LONG TERM GOAL #3   Title  Pt will improve DGI 20/24 for decreased fall risk.    Time  9    Period  Weeks    Status  Achieved      PT LONG TERM GOAL #4   Title  Pt will ambulate at least 1000 ft, indoors and outdoor surfaces, independently for improved outdoor gait efficiency and safety.    Time  9    Period  Weeks    Status  On-going      PT LONG TERM GOAL #5   Title  Pt will perform floor>stand transfer with UE support, modified independently, for improved fall recovery.    Time  9    Period  Weeks    Status  On-going            Plan - 02/07/18 0915    Clinical  Impression Statement  Thorough review of HEP-pt return demo understanding with min verbal cues for slowed performance/pacing of exercises.  Added SLS and tandem stance to HEP to address hip stability.  Gait training today again, pt needing cues for consistent foot clearance, equal and even step length with gait.    Rehab Potential  Good    PT Frequency  2x / week    PT Duration  Other (comment)   9 weeks   PT Treatment/Interventions  ADLs/Self Care Home Management;DME Instruction;Therapeutic exercise;Balance training;Therapeutic activities;Functional mobility training;Stair training;Gait training;Neuromuscular re-education;Patient/family education    PT Next Visit Plan  Review updates to HEP; add walking program to HEP; further gait training with cane and/or foot-up brace    PT Home Exercise Plan  Access Code: [email or web address]     Consulted and Agree with Plan of Care  Patient;Family member/caregiver       Patient will benefit from skilled therapeutic intervention in order to improve the following deficits and impairments:  Abnormal gait, Decreased activity tolerance, Decreased balance, Decreased coordination, Decreased mobility, Decreased strength, Difficulty walking  Visit Diagnosis: Muscle weakness (generalized)  Unsteadiness on feet  Other abnormalities of gait and mobility     Problem List Patient Active Problem List   Diagnosis Date Noted  . Acute CVA (cerebrovascular accident) ([place]) 09/18/2017  . 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 Diabetes mellitus without complication ([place]) 51/03/5850  . Closed head injury 04/22/2013  . HLD (hyperlipidemia) 07/29/2009  . Essential hypertension 07/29/2009  . EFFUSION, PLEURAL 07/29/2009  . DIVERTICULITIS, COLON 07/29/2009  . 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 COLONIC POLYPS, HYPERPLASTIC, HX OF 07/29/2009    MARRIOTT,AMY W. 02/07/2018, 9:18 AM Mady Haagensen, PT 02/07/18 9:21 AM Phone: [phone] Fax: [facility] [address] [place], [place], [address] Phone: [phone]   Fax:  [phone]  Name: Justin [name] MRN: [id number] Date of Birth: [birth year]

## 2018-02-08 ENCOUNTER — Encounter: Payer: Self-pay | Admitting: Physical Therapy

## 2018-02-08 ENCOUNTER — Ambulatory Visit: Payer: Medicare HMO | Admitting: Physical Therapy

## 2018-02-08 DIAGNOSIS — R2681 Unsteadiness on feet: Secondary | ICD-10-CM

## 2018-02-08 DIAGNOSIS — R2689 Other abnormalities of gait and mobility: Secondary | ICD-10-CM | POA: Diagnosis not present

## 2018-02-08 NOTE — Patient Instructions (Signed)
WALKING  Walking is a great form of exercise to increase your strength, endurance and overall fitness.  A walking program can help you start slowly and gradually build endurance as you go.  Everyone's ability is different, so each person's starting point will be different.  You do not have to follow them exactly.  The are just samples. You should simply find out what's right for you and stick to that program.   In the beginning, you'll start off walking 2-3 times a day for short distances.  As you get stronger, you'll be walking further at just 1-2 times per day.  A. You Can Walk For A Certain Length Of Time Each Day    Walk 4-5 minutes 3 times per day.  Increase 1-2 minutes every 3 days (3 times per day).  Work up to 15-20 minutes (1-2 times per day).   Example:   Day 1-3 4-5 minutes 3 times per day   Day 4-7 8-10 minutes 2-3 times per day   Day 13-14 15-20 minutes 1-2 times per day

## 2018-02-08 NOTE — Progress Notes (Signed)
Erroneous encounter

## 2018-02-08 NOTE — Therapy (Signed)
[facility] [address] [place], [place], [address] Phone: [phone]   Fax:  [phone]  Physical Therapy Treatment  Patient Details  Name: Justin [name] MRN: [id number] Date of Birth: 1940-05-15 Referring Provider (PT): Roe Coombs   Encounter Date: 02/08/2018  PT End of Session - 02/08/18 1130    Visit Number  13    Number of Visits  18    Date for PT Re-Evaluation  03/15/18    Authorization Type  [employer]   PN completed at 9th visit on 01/16/18   Authorization Time Period  Medicare cert period:  60/04/59-9/77/41    PT Start Time  0849    PT Stop Time  0929    PT Time Calculation (min)  40 min    Equipment Utilized During Treatment  Gait belt    Activity Tolerance  Patient tolerated treatment well    Behavior During Therapy  [facility] for tasks assessed/performed       Past Medical History:  Diagnosis Date  . Brain bleed ([place])   . Cancer [facility])    prostate  . Diabetes mellitus without complication ([place])   . High cholesterol   . Hypertension   . Stroke [facility])     Past Surgical History:  Procedure Laterality Date  . arm & hand surgery Left 1972   skill saw accident  . COLON SURGERY     partial  . EYE SURGERY     lens implant and cateract- Left  . PROSTATECTOMY      There were no vitals filed for this visit.  Subjective Assessment - 02/08/18 0854    Subjective  No changes since last visit.  Did the new exercises.    Patient is accompained by:  Family member    Patient Stated Goals  Pt's goal for therapy is to be able to work around the house more (with less fatigue).  To improve walking and stop dragging R foot.    Currently in Pain?  No/denies                       [facility] Adult PT Treatment/Exercise - 02/08/18 0001      Ambulation/Gait   Ambulation/Gait  Yes    Ambulation/Gait Assistance  5: Supervision    Ambulation/Gait Assistance Details  Gait x 4:30 to establish  baseline for walking program at home; had to stop x 2 reps for cues to increase R step length and foot clearance.    Ambulation Distance (Feet)  600 Feet   then additional 315 ft with walking poles   Assistive device  None   bilateral walking poles to facilitate arm swing   Gait Pattern  Step-through pattern;Decreased step length - right;Decreased step length - left;Decreased stride length;Poor foot clearance - right    Ambulation Surface  Level;Indoor    Pre-Gait Activities  Discussed trying gait with cane again today, and pt states he will not use a cane.  Cane not used with gait today.    Gait Comments  Discussed walking program for home          Balance Exercises - 02/08/18 0854      Balance Exercises: Standing   Tandem Stance  Eyes open;Upper extremity support 1;3 reps;10 secs    SLS  Eyes open;Solid surface;Upper extremity support 1;3 reps;10 secs    Tandem Gait  Forward;Retro;Upper extremity support;Foam/compliant surface;3 reps   progress to tandem march along balance beam  Sidestepping  Foam/compliant support;Upper extremity support;3 reps   along counter on balance beam   Other Standing Exercises  Heel walking, toe walking, 3 reps each along counter.  Standing on blue mat surface:  marching in place 2 sets x 10 reps, then forward stepping x 10 reps, then forward/back stepping x 10 reps each leg, cues for increased RLE foot clearance.   Side step and squat x 10 each direction, on balance beam       PT Education - 02/08/18 1130    Education Details  Walking program added to [employer]) Educated  Patient    Methods  Explanation;Demonstration;Handout    Comprehension  Verbalized understanding;Returned demonstration;Verbal cues required       PT Short Term Goals - 01/17/18 1256      PT SHORT TERM GOAL #1   Title  Pt will perform HEP with wife's supervision for improved strength, balance, gait.  TARGET 01/20/18    Baseline  11/22: Pt able to perform HEP  independently.     Time  5    Period  Weeks    Status  Achieved      PT SHORT TERM GOAL #2   Title  Pt will improve 5x sit<>stand to less than or equal to 18 seconds for improved transfer efficiency and safety.    Baseline  11/22: 14.60 secs with no UE support required.     Time  5    Period  Weeks    Status  Achieved      PT SHORT TERM GOAL #3   Title  Pt will improve TUG score to less than or equal to 13.5 seconds for decreased fall risk.    Baseline  11/22: 12.03 secs    Time  5    Period  Weeks    Status  Achieved      PT SHORT TERM GOAL #4   Title  Pt will improve DGI score to at least 18/24 for decreased fall risk.    Baseline  01/16/18:  20/24    Time  5    Period  Weeks    Status  Achieved      PT SHORT TERM GOAL #5   Title  Pt/wife will verbalize understanding of fall prevention in home environment.    Baseline  11/22: Pt verbalizes understanding of fall prevention in home.     Time  5    Period  Weeks    Status  Achieved        PT Long Term Goals - 01/17/18 1606      PT LONG TERM GOAL #1   Title  Pt will be independent with progression of HEP for improved strength, balance, gait.  TARGET 02/17/18    Time  9    Period  Weeks    Status  On-going    Target Date  02/17/18      PT LONG TERM GOAL #2   Title  Pt will improve gait velocity to at least 2.62 ft/sec for decreased fall risk, improved gait efficiency.    Time  9    Period  Weeks    Status  On-going      PT LONG TERM GOAL #3   Title  Pt will improve DGI 20/24 for decreased fall risk.    Time  9    Period  Weeks    Status  Achieved      PT LONG TERM GOAL #4   Title  Pt will ambulate at least 1000 ft, indoors and outdoor surfaces, independently for improved outdoor gait efficiency and safety.    Time  9    Period  Weeks    Status  On-going      PT LONG TERM GOAL #5   Title  Pt will perform floor>stand transfer with UE support, modified independently, for improved fall recovery.    Time  9     Period  Weeks    Status  On-going            Plan - 02/08/18 1131    Clinical Impression Statement  Reviewed additions to HEP and worked on additional dynamic balance and compliant surface exercises.  Also added walking program to HEP.  With exercises, pt does better job with RLE foot clearance; however, with gait, pt continues to need occasional cues for R foot clearance and step length.  Pt will beneift from additional skilled PT to work towards Learned for improved balance and mobiltiy.    Rehab Potential  Good    PT Frequency  2x / week    PT Duration  Other (comment)   9 weeks   PT Treatment/Interventions  ADLs/Self Care Home Management;DME Instruction;Therapeutic exercise;Balance training;Therapeutic activities;Functional mobility training;Stair training;Gait training;Neuromuscular re-education;Patient/family education    PT Next Visit Plan  Review walking program for HEP; work on floor to stand transfer; by week's end, will need to check LTGs (discuss with pt/wife-probable d/c?)    PT Home Exercise Plan  Access Code: [email or web address]     Consulted and Agree with Plan of Care  Patient      Patient will benefit from skilled therapeutic intervention in order to improve the following deficits and impairments:  Abnormal gait, Decreased activity tolerance, Decreased balance, Decreased coordination, Decreased mobility, Decreased strength, Difficulty walking  Visit Diagnosis: Unsteadiness on feet  Other abnormalities of gait and mobility     Problem List Patient Active Problem List   Diagnosis Date Noted  . Acute CVA (cerebrovascular accident) ([place]) 09/18/2017  . 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 Diabetes mellitus without complication ([place]) 23/30/0762  . Closed head injury 04/22/2013  . HLD (hyperlipidemia) 07/29/2009  . Essential hypertension 07/29/2009  . EFFUSION, PLEURAL 07/29/2009  . DIVERTICULITIS, COLON 07/29/2009  . 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 COLONIC POLYPS,  HYPERPLASTIC, HX OF 07/29/2009    Swathi Dauphin W. 02/08/2018, 11:34 AM  Frazier Butt., PT   [facility] [address] [place], [place], [address] Phone: [phone]   Fax:  [phone]  Name: ANDON VILLARD MRN: [id number] Date of Birth: 1941-01-28

## 2018-02-13 ENCOUNTER — Ambulatory Visit: Payer: Medicare HMO | Admitting: Physical Therapy

## 2018-02-13 ENCOUNTER — Encounter: Payer: Self-pay | Admitting: Physical Therapy

## 2018-02-13 DIAGNOSIS — M6281 Muscle weakness (generalized): Secondary | ICD-10-CM

## 2018-02-13 DIAGNOSIS — R2689 Other abnormalities of gait and mobility: Secondary | ICD-10-CM

## 2018-02-13 DIAGNOSIS — R2681 Unsteadiness on feet: Secondary | ICD-10-CM

## 2018-02-13 NOTE — Therapy (Signed)
[facility] [address] [place], [place], [address] Phone: [phone]   Fax:  [phone]  Physical Therapy Treatment  Patient Details  Name: Justin [name] MRN: [id number] Date of Birth: 21-Feb-1941 Referring Provider (PT): Roe Coombs   Encounter Date: 02/13/2018  PT End of Session - 02/13/18 1118    Visit Number  14    Number of Visits  18    Date for PT Re-Evaluation  03/15/18    Authorization Type  [employer]   PN completed at 9th visit on 01/16/18   Authorization Time Period  Medicare cert period:  16/07/37-02/28/24    PT Start Time  1018    PT Stop Time  1100    PT Time Calculation (min)  42 min    Equipment Utilized During Treatment  Gait belt    Activity Tolerance  Patient tolerated treatment well    Behavior During Therapy  [facility] for tasks assessed/performed       Past Medical History:  Diagnosis Date  . Brain bleed ([place])   . Cancer [facility])    prostate  . Diabetes mellitus without complication ([place])   . High cholesterol   . Hypertension   . Stroke [facility])     Past Surgical History:  Procedure Laterality Date  . arm & hand surgery Left 1972   skill saw accident  . COLON SURGERY     partial  . EYE SURGERY     lens implant and cateract- Left  . PROSTATECTOMY      There were no vitals filed for this visit.  Subjective Assessment - 02/13/18 1021    Subjective  Denies falls or changes since last visit.      Patient Stated Goals  Pt's goal for therapy is to be able to work around the house more (with less fatigue).  To improve walking and stop dragging R foot.    Currently in Pain?  No/denies                       [facility] Adult PT Treatment/Exercise - 02/13/18 0001      Transfers   Transfers  Sit to Stand;Stand to Sit    Sit to Stand  6: Modified independent (Device/Increase time)    Stand to Sit  6: Modified independent (Device/Increase time);With upper extremity  assist;Without upper extremity assist;To chair/3-in-1    Number of Reps  10 reps    Comments  lean against mat at times      Ambulation/Gait   Ambulation/Gait  Yes    Ambulation/Gait Assistance  5: Supervision    Ambulation Distance (Feet)  330 Feet   x 1;220 x 1   Assistive device  None    Gait Pattern  Step-through pattern;Decreased step length - right;Decreased step length - left;Decreased stride length;Poor foot clearance - right    Ambulation Surface  Level;Indoor    Gait velocity  10.47 sec= 3.13 ft/sec    Pre-Gait Activities  Discussed walking program at home  Pt reports walking outside for approx 12 minutes.  Unsure if this is accurate as pt states it is approx 600'x2      Gait Comments  Needed frequent cues for R heel strike, armswing and cadence.  Pt easily distracted and quickly reverts back to his prefered pattern.      Dynamic Gait Index   Level Surface  Mild Impairment    Change in Gait Speed  Normal  Gait with Horizontal Head Turns  Mild Impairment    Gait with Vertical Head Turns  Normal    Gait and Pivot Turn  Normal    Step Over Obstacle  Normal    Step Around Obstacles  Normal    Steps  Normal    Total Score  22      High Level Balance   High Level Balance Activities  Side stepping;Other (comment)    High Level Balance Comments  at counter-walking on heels, side stepping, standing marching-intermittent UE support of counter      Knee/Hip Exercises: Seated   Long Arc Quad  Both;1 set;10 reps    Long [employer] Limitations  cues for control and full ROM      Knee/Hip Exercises: Supine   Bridges  AROM;Strengthening;1 set;10 reps    Bridges Limitations  cues for technique and equal weight bearing      Knee/Hip Exercises: Sidelying   Hip ABduction  AROM;Strengthening;Both;1 set;10 reps    Hip ABduction Limitations  cues for technique       Floor<>stand transfers modified I with 1 UE support      PT Education - 02/13/18 1117    Education Details   Discharge next visit;continuing walking and exercises at home    Person(s) Educated  Patient    Methods  Explanation    Comprehension  Verbalized understanding       PT Short Term Goals - 01/17/18 1256      PT SHORT TERM GOAL #1   Title  Pt will perform HEP with wife's supervision for improved strength, balance, gait.  TARGET 01/20/18    Baseline  11/22: Pt able to perform HEP independently.     Time  5    Period  Weeks    Status  Achieved      PT SHORT TERM GOAL #2   Title  Pt will improve 5x sit<>stand to less than or equal to 18 seconds for improved transfer efficiency and safety.    Baseline  11/22: 14.60 secs with no UE support required.     Time  5    Period  Weeks    Status  Achieved      PT SHORT TERM GOAL #3   Title  Pt will improve TUG score to less than or equal to 13.5 seconds for decreased fall risk.    Baseline  11/22: 12.03 secs    Time  5    Period  Weeks    Status  Achieved      PT SHORT TERM GOAL #4   Title  Pt will improve DGI score to at least 18/24 for decreased fall risk.    Baseline  01/16/18:  20/24    Time  5    Period  Weeks    Status  Achieved      PT SHORT TERM GOAL #5   Title  Pt/wife will verbalize understanding of fall prevention in home environment.    Baseline  11/22: Pt verbalizes understanding of fall prevention in home.     Time  5    Period  Weeks    Status  Achieved        PT Long Term Goals - 02/13/18 1122      PT LONG TERM GOAL #1   Title  Pt will be independent with progression of HEP for improved strength, balance, gait.  TARGET 02/17/18    Time  9    Period  Weeks  Status  On-going      PT LONG TERM GOAL #2   Title  Pt will improve gait velocity to at least 2.62 ft/sec for decreased fall risk, improved gait efficiency.    Baseline  3.13 ft/sec on 02/13/18    Time  9    Period  Weeks    Status  Achieved      PT LONG TERM GOAL #3   Title  Pt will improve DGI 20/24 for decreased fall risk.    Baseline  22/24 on  02/13/18    Time  9    Period  Weeks    Status  Achieved      PT LONG TERM GOAL #4   Title  Pt will ambulate at least 1000 ft, indoors and outdoor surfaces, independently for improved outdoor gait efficiency and safety.    Time  9    Period  Weeks    Status  On-going      PT LONG TERM GOAL #5   Title  Pt will perform floor>stand transfer with UE support, modified independently, for improved fall recovery.    Baseline  met 02/13/18    Time  9    Period  Weeks    Status  Achieved            Plan - 02/13/18 1118    Clinical Impression Statement  Pt requires frequent cues to stay on task during session.  Gait improves with foot clearance and cadence with cues but quickly reverts back to his normal pattern.  Pt met LTG's 2,3 and 5.  To check remaining goals at next discharge visit.  Discharge next visit per POC and primary PT.    Rehab Potential  Good    PT Frequency  2x / week    PT Duration  Other (comment)   9 weeks   PT Treatment/Interventions  ADLs/Self Care Home Management;DME Instruction;Therapeutic exercise;Balance training;Therapeutic activities;Functional mobility training;Stair training;Gait training;Neuromuscular re-education;Patient/family education    PT Next Visit Plan  Give written walking program for HEP;gait outdoors if weather permits;finish HEP review/revise.    PT Home Exercise Plan  Access Code: [email or web address]     Consulted and Agree with Plan of Care  Patient;Family member/caregiver       Patient will benefit from skilled therapeutic intervention in order to improve the following deficits and impairments:  Abnormal gait, Decreased activity tolerance, Decreased balance, Decreased coordination, Decreased mobility, Decreased strength, Difficulty walking  Visit Diagnosis: Unsteadiness on feet  Other abnormalities of gait and mobility  Muscle weakness (generalized)     Problem List Patient Active Problem List   Diagnosis Date Noted  . Acute CVA  (cerebrovascular accident) ([place]) 09/18/2017  . 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 Diabetes mellitus without complication ([place]) 62/22/9798  . Closed head injury 04/22/2013  . HLD (hyperlipidemia) 07/29/2009  . Essential hypertension 07/29/2009  . EFFUSION, PLEURAL 07/29/2009  . DIVERTICULITIS, COLON 07/29/2009  . 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 COLONIC POLYPS, HYPERPLASTIC, HX OF 07/29/2009    Narda Bonds, [place] 02/13/18 11:25 AM Phone: [phone] Fax: [facility] [address] [place], [place], [address] Phone: [phone]   Fax:  [phone]  Name: EROL FLANAGIN MRN: [id number] Date of Birth: [birth year]

## 2018-02-14 NOTE — Progress Notes (Signed)
This encounter was created in error - please disregard.

## 2018-02-17 ENCOUNTER — Encounter: Payer: Self-pay | Admitting: Physical Therapy

## 2018-02-17 ENCOUNTER — Ambulatory Visit: Payer: Medicare HMO | Admitting: Physical Therapy

## 2018-02-17 DIAGNOSIS — R2689 Other abnormalities of gait and mobility: Secondary | ICD-10-CM

## 2018-02-17 DIAGNOSIS — M6281 Muscle weakness (generalized): Secondary | ICD-10-CM

## 2018-02-17 DIAGNOSIS — R2681 Unsteadiness on feet: Secondary | ICD-10-CM

## 2018-02-17 NOTE — Patient Instructions (Addendum)
Walking Program:  Begin walking for exercise for 15 minutes, 1 times/day, 5-6 days/week.   Progress your walking program by adding 3 minutes to your routine each week, as tolerated. Goal is to be able to walk for 30 minutes. Be sure to wear good walking shoes, walk in a safe environment and only progress to your tolerance.       Access Code: [email or web address]  URL: [email or web address]  Date: 02/17/2018  Prepared by: Nita Sells   Exercises  Supine Bridge - 10 reps - 3 sets - 2x daily - 6x weekly  Sidelying Hip Abduction - 10 reps - 1-3 sets - 2x daily - 6x weekly  Seated Long Arc Quad - 10 reps - 2-3 sets - 2x daily - 6x weekly  Sit to Stand without Arm Support - 10 reps - 1 sets - 2x daily - 6x weekly  Step Up - 10 reps - 3 sets - 2x daily - 6x weekly  Lateral Step Up - 10 reps - 3 sets - 2x daily - 6x weekly  Side Stepping with Counter Support - 3-5 reps - 1 sets - 2x daily - 6x weekly  March in Place - 10 reps - 2-3 sets - 1x daily - 6x weekly  Heel Toe Raises with Counter Support - 10 reps - 2-3 sets - 3 sec hold - 1x daily - 6x weekly  Single Leg Stance with Support - 3 reps - 1 sets - 10 sec hold - 1x daily - 6x weekly  Standing Tandem Balance with Counter Support - 3 reps - 1 sets - 10 sec hold - 1x daily - 6x weekly  Seated Ankle Eversion with Resistance - 10 reps - 2 sets - 1x daily - 7x weekly  Seated Ankle Dorsiflexion with Resistance - 10 reps - 2 sets - 1x daily - 7x weekly

## 2018-02-17 NOTE — Therapy (Signed)
[place] [address] [place], [place], [address] Phone: [phone]   Fax:  [phone]  Physical Therapy Treatment  Patient Details  Name: Justin [name] MRN: [id number] Date of Birth: 1940-11-30 Referring Provider (PT): Roe Coombs   Encounter Date: 02/17/2018  PT End of Session - 02/17/18 1329    Visit Number  15    Number of Visits  18    Date for PT Re-Evaluation  03/15/18    Authorization Type  [employer]   PN completed at 9th visit on 01/16/18   Authorization Time Period  Medicare cert period:  35/57/32-03/26/52    PT Start Time  1017    PT Stop Time  1058    PT Time Calculation (min)  41 min    Equipment Utilized During Treatment  Gait belt    Activity Tolerance  Patient tolerated treatment well    Behavior During Therapy  [facility] for tasks assessed/performed       Past Medical History:  Diagnosis Date  . Brain bleed ([place])   . Cancer [facility])    prostate  . Diabetes mellitus without complication ([place])   . High cholesterol   . Hypertension   . Stroke [facility])     Past Surgical History:  Procedure Laterality Date  . arm & hand surgery Left 1972   skill saw accident  . COLON SURGERY     partial  . EYE SURGERY     lens implant and cateract- Left  . PROSTATECTOMY      There were no vitals filed for this visit.  Subjective Assessment - 02/17/18 1321    Subjective  Denies falls or changes.  Feels ready for discharge.  Has been walking outdoors at home consistently.    Patient Stated Goals  Pt's goal for therapy is to be able to work around the house more (with less fatigue).  To improve walking and stop dragging R foot.    Currently in Pain?  No/denies       [facility] Adult PT Treatment/Exercise - 02/17/18 0001      Ambulation/Gait   Ambulation/Gait  Yes    Ambulation/Gait Assistance  7: Independent    Ambulation Distance (Feet)  600 Feet   plus   Assistive device  None    Gait Pattern   Step-through pattern;Decreased step length - right;Decreased step length - left;Decreased stride length;Poor foot clearance - right;Decreased arm swing - right;Decreased arm swing - left;Decreased dorsiflexion - right    Ambulation Surface  Level;Unlevel;Indoor;Outdoor    Gait Comments  Continues to "scuff" R foot with gait and is able to correct with cues but quickly reverts back.  Decreased awaremess.      High Level Balance   High Level Balance Activities  Other (comment)    High Level Balance Comments  single limb stance and tandem stance x 10 seconds x 5 reps each-needed intermittent UE support      Knee/Hip Exercises: Standing   Lateral Step Up  Both;1 set;15 reps;Hand Hold: 1;Step Height: 6"    Forward Step Up  Both;1 set;15 reps;Hand Hold: 1;Step Height: 6"      Ankle Exercises: Seated   Other Seated Ankle Exercises  RLE with red theraband for dorsiflexion x 10;Eversion x 10-added to HEP          Balance Exercises - 02/17/18 1325      Balance Exercises: Standing   Standing Eyes Opened  Narrow base of support (BOS);Wide (BOA);Head turns;Foam/compliant  surface;Solid surface;5 reps;10 secs    Standing Eyes Closed  Narrow base of support (BOS);Wide (BOA);Foam/compliant surface;Solid surface;5 reps;10 secs        PT Education - 02/17/18 1111    Education Details  Goals met and discharge, updated HEP and walking program    Person(s) Educated  Patient    Methods  Explanation;Demonstration;Handout    Comprehension  Verbalized understanding;Returned demonstration       PT Short Term Goals - 01/17/18 1256      PT SHORT TERM GOAL #1   Title  Pt will perform HEP with wife's supervision for improved strength, balance, gait.  TARGET 01/20/18    Baseline  11/22: Pt able to perform HEP independently.     Time  5    Period  Weeks    Status  Achieved      PT SHORT TERM GOAL #2   Title  Pt will improve 5x sit<>stand to less than or equal to 18 seconds for improved transfer  efficiency and safety.    Baseline  11/22: 14.60 secs with no UE support required.     Time  5    Period  Weeks    Status  Achieved      PT SHORT TERM GOAL #3   Title  Pt will improve TUG score to less than or equal to 13.5 seconds for decreased fall risk.    Baseline  11/22: 12.03 secs    Time  5    Period  Weeks    Status  Achieved      PT SHORT TERM GOAL #4   Title  Pt will improve DGI score to at least 18/24 for decreased fall risk.    Baseline  01/16/18:  20/24    Time  5    Period  Weeks    Status  Achieved      PT SHORT TERM GOAL #5   Title  Pt/wife will verbalize understanding of fall prevention in home environment.    Baseline  11/22: Pt verbalizes understanding of fall prevention in home.     Time  5    Period  Weeks    Status  Achieved        PT Long Term Goals - 02/17/18 1110      PT LONG TERM GOAL #1   Title  Pt will be independent with progression of HEP for improved strength, balance, gait.  TARGET 02/17/18    Time  9    Period  Weeks    Status  Achieved      PT LONG TERM GOAL #2   Title  Pt will improve gait velocity to at least 2.62 ft/sec for decreased fall risk, improved gait efficiency.    Baseline  3.13 ft/sec on 02/13/18    Time  9    Period  Weeks    Status  Achieved      PT LONG TERM GOAL #3   Title  Pt will improve DGI 20/24 for decreased fall risk.    Baseline  22/24 on 02/13/18    Time  9    Period  Weeks    Status  Achieved      PT LONG TERM GOAL #4   Title  Pt will ambulate at least 1000 ft, indoors and outdoor surfaces, independently for improved outdoor gait efficiency and safety.    Time  9    Period  Weeks    Status  Achieved  PT LONG TERM GOAL #5   Title  Pt will perform floor>stand transfer with UE support, modified independently, for improved fall recovery.    Baseline  met 02/13/18    Time  9    Period  Weeks    Status  Achieved            Plan - 02/17/18 1329    Clinical Impression Statement  Finished  checking pt's goals.  Pt met all goals.  Added R dorsiflexion and eversion to HEP and provided new handout of HEP along with walking program.  Pt agrees to discharge and states no further questions/concerns for PT at this time.  Discharger per Mady Haagensen, PT.    Rehab Potential  Good    PT Duration  --   9 weeks   PT Next Visit Plan  Discharge from PT    PT Home Exercise Plan  Access Code: [email or web address]     Consulted and Agree with Plan of Care  Patient       Patient will benefit from skilled therapeutic intervention in order to improve the following deficits and impairments:     Visit Diagnosis: Unsteadiness on feet  Other abnormalities of gait and mobility  Muscle weakness (generalized)     Problem List Patient Active Problem List   Diagnosis Date Noted  . Acute CVA (cerebrovascular accident) ([place]) 09/18/2017  . 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 Diabetes mellitus without complication ([place]) 18/55/0158  . Closed head injury 04/22/2013  . HLD (hyperlipidemia) 07/29/2009  . Essential hypertension 07/29/2009  . EFFUSION, PLEURAL 07/29/2009  . DIVERTICULITIS, COLON 07/29/2009  . 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 COLONIC POLYPS, HYPERPLASTIC, HX OF 07/29/2009    Justin [name], PTA [facility] 02/17/18 1:34 PM Phone: [phone] Fax: [place] [address] [place], [place], [address] Phone: [phone]   Fax:  [phone]  Name: Justin [name] MRN: [id number] Date of Birth: 1940/12/28

## 2018-02-21 ENCOUNTER — Encounter: Payer: Self-pay | Admitting: Physical Therapy

## 2018-02-21 NOTE — Therapy (Signed)
[place] [address] [place], [place], [address] Phone: [phone]   Fax:  [phone]  Patient Details  Name: Justin [name] MRN: [id number] Date of Birth: September 16, 1940 Referring Provider:  No ref. provider found  Encounter Date: 02/21/2018   PHYSICAL THERAPY DISCHARGE SUMMARY  Visits from Start of Care: 15  Current functional level related to goals / functional outcomes: PT Long Term Goals - 02/17/18 1110      PT LONG TERM GOAL #1   Title  Pt will be independent with progression of HEP for improved strength, balance, gait.  TARGET 02/17/18    Time  9    Period  Weeks    Status  Achieved      PT LONG TERM GOAL #2   Title  Pt will improve gait velocity to at least 2.62 ft/sec for decreased fall risk, improved gait efficiency.    Baseline  3.13 ft/sec on 02/13/18    Time  9    Period  Weeks    Status  Achieved      PT LONG TERM GOAL #3   Title  Pt will improve DGI 20/24 for decreased fall risk.    Baseline  22/24 on 02/13/18    Time  9    Period  Weeks    Status  Achieved      PT LONG TERM GOAL #4   Title  Pt will ambulate at least 1000 ft, indoors and outdoor surfaces, independently for improved outdoor gait efficiency and safety.    Time  9    Period  Weeks    Status  Achieved      PT LONG TERM GOAL #5   Title  Pt will perform floor>stand transfer with UE support, modified independently, for improved fall recovery.    Baseline  met 02/13/18    Time  9    Period  Weeks    Status  Achieved      Pt has met all LTGs.   PT Short Term Goals - 01/17/18 1256      PT SHORT TERM GOAL #1   Title  Pt will perform HEP with wife's supervision for improved strength, balance, gait.  TARGET 01/20/18    Baseline  11/22: Pt able to perform HEP independently.     Time  5    Period  Weeks    Status  Achieved      PT SHORT TERM GOAL #2   Title  Pt will improve 5x sit<>stand to less than or equal to 18 seconds  for improved transfer efficiency and safety.    Baseline  11/22: 14.60 secs with no UE support required.     Time  5    Period  Weeks    Status  Achieved      PT SHORT TERM GOAL #3   Title  Pt will improve TUG score to less than or equal to 13.5 seconds for decreased fall risk.    Baseline  11/22: 12.03 secs    Time  5    Period  Weeks    Status  Achieved      PT SHORT TERM GOAL #4   Title  Pt will improve DGI score to at least 18/24 for decreased fall risk.    Baseline  01/16/18:  20/24    Time  5    Period  Weeks    Status  Achieved      PT SHORT TERM GOAL #5  Title  Pt/wife will verbalize understanding of fall prevention in home environment.    Baseline  11/22: Pt verbalizes understanding of fall prevention in home.     Time  5    Period  Weeks    Status  Achieved      Pt has met all STGs.  Remaining deficits: High level balance, strength   Education / Equipment: Educated in fall prevention education, HEP  Plan: Patient agrees to discharge.  Patient goals were met. Patient is being discharged due to meeting the stated rehab goals.  ?????        Mady Haagensen, PT 02/21/18 7:57 AM Phone: [phone] Fax: [phone]  Frazier Butt. 02/21/2018, 7:55 AM  [facility] [address] [place], [place], [address] Phone: [phone]   Fax:  [phone]

## 2018-02-28 ENCOUNTER — Ambulatory Visit: Payer: No Typology Code available for payment source | Admitting: Neurology

## 2018-03-01 ENCOUNTER — Ambulatory Visit (INDEPENDENT_AMBULATORY_CARE_PROVIDER_SITE_OTHER): Payer: Medicare HMO | Admitting: Neurology

## 2018-03-01 DIAGNOSIS — G4733 Obstructive sleep apnea (adult) (pediatric): Secondary | ICD-10-CM

## 2018-03-01 DIAGNOSIS — G4761 Periodic limb movement disorder: Secondary | ICD-10-CM

## 2018-03-01 DIAGNOSIS — E669 Obesity, unspecified: Secondary | ICD-10-CM

## 2018-03-01 DIAGNOSIS — M7989 Other specified soft tissue disorders: Secondary | ICD-10-CM

## 2018-03-01 DIAGNOSIS — R400 Somnolence: Secondary | ICD-10-CM

## 2018-03-01 DIAGNOSIS — G4720 Circadian rhythm sleep disorder, unspecified type: Secondary | ICD-10-CM

## 2018-03-01 DIAGNOSIS — Z8673 Personal history of transient ischemic attack (TIA), and cerebral infarction without residual deficits: Secondary | ICD-10-CM

## 2018-03-06 ENCOUNTER — Telehealth: Payer: Self-pay

## 2018-03-06 NOTE — Progress Notes (Signed)
Patient referred by Gertie Gowda, NP, seen by me on 11/09/17, diagnostic PSG on 01/17/18. Patient had a CPAP titration study on 03/01/18.  Please call and inform patient that I have entered an order for treatment with positive airway pressure (PAP) treatment for obstructive sleep apnea (OSA). He did well during the latest sleep study with CPAP. We will, therefore, arrange for a machine for home use through a DME (durable medical equipment) company of His choice; and I will see the patient back in follow-up in about 10 weeks. Please also explain to the patient that I will be looking out for compliance data, which can be downloaded from the machine (stored on an SD card, that is inserted in the machine) or via remote access through a modem, that is built into the machine. At the time of the followup appointment we will discuss sleep study results and how it is going with PAP treatment at home. Please advise patient to bring His machine at the time of the first FU visit, even though this is cumbersome. Bringing the machine for every visit after that will likely not be needed, but often helps for the first visit to troubleshoot if needed. Please re-enforce the importance of compliance with treatment and the need for [place] to monitor compliance data - often an insurance requirement and actually good feedback for the patient as far as how they are doing.  Also remind patient, that any interim PAP machine or mask issues should be first addressed with the DME company, as they can often help better with technical and mask fit issues. Please ask if patient has a preference regarding DME company.  Please also make sure, the patient has a follow-up appointment with me in about 10 weeks from the setup date, thanks. May see one of our nurse practitioners if needed for proper timing of the FU appointment.  Please fax or rout report to the referring provider. Thanks,   Star Age, MD, PhD [facility])

## 2018-03-06 NOTE — Addendum Note (Signed)
Addended by: Star Age on: 03/06/2018 07:59 AM   Modules accepted: Orders

## 2018-03-06 NOTE — Telephone Encounter (Signed)
-----   Message from Star Age, MD sent at 03/06/2018  7:59 AM EST ----- Patient referred by Gertie Gowda, NP, seen by me on 11/09/17, diagnostic PSG on 01/17/18. Patient had a CPAP titration study on 03/01/18.  Please call and inform patient that I have entered an order for treatment with positive airway pressure (PAP) treatment for obstructive sleep apnea (OSA). He did well during the latest sleep study with CPAP. We will, therefore, arrange for a machine for home use through a DME (durable medical equipment) company of His choice; and I will see the patient back in follow-up in about 10 weeks. Please also explain to the patient that I will be looking out for compliance data, which can be downloaded from the machine (stored on an SD card, that is inserted in the machine) or via remote access through a modem, that is built into the machine. At the time of the followup appointment we will discuss sleep study results and how it is going with PAP treatment at home. Please advise patient to bring His machine at the time of the first FU visit, even though this is cumbersome. Bringing the machine for every visit after that will likely not be needed, but often helps for the first visit to troubleshoot if needed. Please re-enforce the importance of compliance with treatment and the need for [place] to monitor compliance data - often an insurance requirement and actually good feedback for the patient as far as how they are doing.  Also remind patient, that any interim PAP machine or mask issues should be first addressed with the DME company, as they can often help better with technical and mask fit issues. Please ask if patient has a preference regarding DME company.  Please also make sure, the patient has a follow-up appointment with me in about 10 weeks from the setup date, thanks. May see one of our nurse practitioners if needed for proper timing of the FU appointment.  Please fax or rout report to the referring provider.  Thanks,   Star Age, MD, PhD [facility])

## 2018-03-06 NOTE — Procedures (Signed)
PATIENT'S NAME:  Justin [name], Justin [name] DOB:      Dec 09, 1940      MR#:    [id number]     DATE OF RECORDING: 03/01/2018 REFERRING M.D.:  Venancio Poisson, NP Study Performed:   CPAP  Titration HISTORY:  78 year old man with a history of hypertension, prostate cancer, diabetes, hyperlipidemia, left basal ganglia stroke in July 2019, and obesity, who presents for a CPAP titration study. His baseline sleep study on 01/17/18 showed a total AHI of 8.1/hour and O2 nadir of 83%. The patient's weight 227 pounds with a height of 70 (inches), resulting in a BMI of 32.5 kg/m2.   CURRENT MEDICATIONS: Aspirin, Plavix, Lasix, Glipizide, Hydrodiuril, Cozaar, Metformin, Ozempic, Vitamin D, Vitamin B12  PROCEDURE:  This is a multichannel digital polysomnogram utilizing the SomnoStar 11.2 system.  Electrodes and sensors were applied and monitored per AASM Specifications.   EEG, EOG, Chin and Limb EMG, were sampled at 200 Hz.  ECG, Snore and Nasal Pressure, Thermal Airflow, Respiratory Effort, CPAP Flow and Pressure, Oximetry was sampled at 50 Hz. Digital video and audio were recorded.      The patient was fitted with a large N20 nasal mask. CPAP was initiated at 5.0 cmH20 with heated humidity per AASM split night standards and pressure was advanced to 11cm H20 because of hypopneas, apneas and desaturations.  At a PAP pressure of 11 cmH20, there was a reduction of the AHI to 0/hour with very brief supine REM sleep achieved and O2 nadir or 94%.   Lights Out was at 20:39 and Lights On at 05:00. Total recording time (TRT) was 501.5 minutes, with a total sleep time (TST) of 309 minutes. The patient's sleep latency was 122.5 minutes, which is delayed. REM latency was 355 minutes which is markedly delayed. The sleep efficiency was 61.6% which is reduced.   SLEEP ARCHITECTURE: WASO (Wake after sleep onset) was 49 minutes with moderate sleep fragmentation noted. There were 24.5 minutes in Stage N1, 84.5 minutes Stage N2, 197 minutes Stage  N3 and 3 minutes in Stage REM.  The percentage of Stage N1 was 7.9%, Stage N2 was 27.3%, Stage N3 was 63.8%, which is increased, and Stage R (REM sleep) was near-absent at 1.%.  RESPIRATORY ANALYSIS:  There was a total of 2 respiratory events: 0 obstructive apneas, 0 central apneas and 0 mixed apneas with a total of 0 apneas and an apnea index (AI) of 0 /hour. There were 2 hypopneas with a hypopnea index of .4/hour. The patient also had 0 respiratory event related arousals (RERAs).      The total APNEA/HYPOPNEA INDEX (AHI) was .4 /hour and the total RESPIRATORY DISTURBANCE INDEX was .4 /hour  0 events occurred in REM sleep and 2 events in NREM. The REM AHI was 0 /hour versus a non-REM AHI of .4 /hour.  The patient spent 188.5 minutes of total sleep time in the supine position and 121 minutes in non-supine. The supine AHI was 0.0, versus a non-supine AHI of 1.0.  OXYGEN SATURATION & C02:  The baseline 02 saturation was 97%, with the lowest being 94%. Time spent below 89% saturation equaled 0 minutes.  PERIODIC LIMB MOVEMENTS:  The patient had a total of 248 Periodic Limb Movements. The Periodic Limb Movement (PLM) index was 48.2 and the PLM Arousal index was 5.2 /hour.   Audio and video analysis did not show any abnormal or unusual movements, behaviors, phonations or vocalizations. The patient took 1 bathroom break. The EKG was in keeping with normal  sinus rhythm.  Post-study, the patient indicated that sleep was the same as usual.    IMPRESSION:   1. Obstructive Sleep Apnea (OSA) 2. Dysfunctions associated with sleep stages or arousal from sleep 3. Periodic Limb Movement Disorder   RECOMMENDATIONS:   1. This study demonstrates resolution of the patient's obstructive sleep apnea with CPAP therapy. I will, therefore, start the patient on home CPAP treatment at a pressure of 11 cm via large nasal mask with heated humidity. The patient should be reminded to be fully compliant with PAP therapy to  improve sleep related symptoms and decrease long term cardiovascular risks. The patient should be reminded, that it may take up to 3 months to get fully used to using PAP with all planned sleep. The earlier full compliance is achieved, the better long term compliance tends to be. Please note that untreated obstructive sleep apnea may carry additional perioperative morbidity. Patients with significant obstructive sleep apnea should receive perioperative PAP therapy and the surgeons and particularly the anesthesiologist should be informed of the diagnosis and the severity of the sleep disordered breathing. 2. Moderate PLMs (periodic limb movements of sleep) were noted during this study with minimal arousals; clinical correlation is recommended.  3. This study shows sleep fragmentation, a reduced sleep efficiency and abnormal sleep stage percentages; these are nonspecific findings and per se do not signify an intrinsic sleep disorder or a cause for the patient's sleep-related symptoms. Causes include (but are not limited to) the first night effect of the sleep study, circadian rhythm disturbances, medication effect or an underlying mood disorder or medical problem.  4. The patient should be cautioned not to drive, work at heights, or operate dangerous or heavy equipment when tired or sleepy. Review and reiteration of good sleep hygiene measures should be pursued with any patient. 5. The patient will be seen in follow-up in the sleep clinic at [facility] for discussion of the test results, symptom and treatment compliance review, further management strategies, etc. The referring provider will be notified of the test results.   I certify that I have reviewed the entire raw data recording prior to the issuance of this report in accordance with the Standards of Accreditation of the American Academy of Sleep Medicine (AASM)   Star Age, MD, PhD Diplomat, American Board of Neurology and Sleep Medicine (Neurology and  Sleep Medicine)

## 2018-03-06 NOTE — Telephone Encounter (Signed)
I called pt, spoke to pt's wife, Justin [name], per DPR. I advised pt's wife that Dr. Rexene Alberts reviewed pt's sleep study results and found that pt did well with the cpap during his latest sleep study. Dr. Rexene Alberts recommends that pt start a cpap at home. I reviewed PAP compliance expectations with the pt's wife. [name] is agreeable to pt starting a CPAP. I advised pt's wife that an order will be sent to a DME, [facility], and [facility] will call the pt within about one week after they file with the pt's insurance. [facility] will show the pt how to use the machine, fit for masks, and troubleshoot the CPAP if needed. A follow up appt was made for insurance purposes with Dr. Rexene Alberts on 05/24/18 at 11:30am. The 03/16/18 appt was cancelled because it is too soon. [name] verbalized understanding to arrive 15 minutes early and bring their CPAP. A letter with all of this information in it will be mailed to the pt as a reminder. I verified with the pt's wife that the address we have on file is correct. [name] verbalized understanding of results. [name] had no questions at this time but was encouraged to call back if questions arise. I have sent the order to [employer] and have received confirmation that they have received the order.

## 2018-03-16 ENCOUNTER — Ambulatory Visit: Payer: No Typology Code available for payment source | Admitting: Neurology

## 2018-04-06 ENCOUNTER — Ambulatory Visit (INDEPENDENT_AMBULATORY_CARE_PROVIDER_SITE_OTHER): Payer: Medicare HMO | Admitting: Adult Health

## 2018-04-06 ENCOUNTER — Encounter: Payer: Self-pay | Admitting: Adult Health

## 2018-04-06 VITALS — BP 148/78 | HR 60 | Wt 228.0 lb

## 2018-04-06 DIAGNOSIS — I63512 Cerebral infarction due to unspecified occlusion or stenosis of left middle cerebral artery: Secondary | ICD-10-CM

## 2018-04-06 DIAGNOSIS — E119 Type 2 diabetes mellitus without complications: Secondary | ICD-10-CM

## 2018-04-06 DIAGNOSIS — E785 Hyperlipidemia, unspecified: Secondary | ICD-10-CM | POA: Diagnosis not present

## 2018-04-06 DIAGNOSIS — I69319 Unspecified symptoms and signs involving cognitive functions following cerebral infarction: Secondary | ICD-10-CM

## 2018-04-06 DIAGNOSIS — I10 Essential (primary) hypertension: Secondary | ICD-10-CM

## 2018-04-06 NOTE — Patient Instructions (Signed)
Continue clopidogrel 75 mg daily  and lipitor  for secondary stroke prevention  Continue to follow up with PCP regarding cholesterol, blood pressure and diabetes management   Encourage continued physical therapy and speech therapy exercises at home - ensure you pay attention more to your leg especially when you are tired or fatigued   Continue to monitor blood pressure at home  Maintain strict control of hypertension with blood pressure goal below 130/90, diabetes with hemoglobin A1c goal below 6.5% and cholesterol with LDL cholesterol (bad cholesterol) goal below 70 mg/dL. I also advised the patient to eat a healthy diet with plenty of whole grains, cereals, fruits and vegetables, exercise regularly and maintain ideal body weight.  Followup in the future with me in 6 months or call earlier if needed       Thank you for coming to see [place] at [facility]. I hope we have been able to provide you high quality care today.  You may receive a patient satisfaction survey over the next few weeks. We would appreciate your feedback and comments so that we may continue to improve ourselves and the health of our patients.

## 2018-04-06 NOTE — Progress Notes (Signed)
[facility] [address] [place]. [place] [address] [phone]       OFFICE FOLLOW UP NOTE  Justin. Justin [name] Date of Birth:  Apr 29, 1940 Medical Record Number:  [id number]   Reason for Referral:  hospital stroke follow up  CHIEF COMPLAINT:  Chief Complaint  Patient presents with  . Follow-up    Follow up for Stroke  with [name] room 9    HPI: 04/06/18 VISIT  Justin [name] is being seen today for routine follow-up after left quadrant and BG infarct in 08/2017 and is accompanied by his wife.  He continues to have residual deficits of cognitive difficulties.  He was evaluated by neuropsychology on 01/12/2018.  Per report, it was recommended that he does not have capacity for medical or financial decision making and due to slow processing speed and impairment on tasks of executive functioning is recommended for him to abstain from driving.  It was determined as though his results were not consistent with Alzheimer's disease but more major vascular neurocognitive disorder.  He showed deficits in attention/working memory/processing speed, language, visuospatial/constructional, memory/learning, executive functioning and adaptive functioning.  Minimal level of depression symptoms.  Wife does continue to endorse difficulty at home especially as he refuses to do any type of memory exercises.  He was diagnosed with OSA and started on CPAP and does feel as though his energy has improved with ongoing compliance.  He continues on Plavix without side effects of bleeding or bruising.  Continues on atorvastatin without side effects myalgias.  Blood pressure today 148/78.  He has been experiencing lumbar region and right hip pain and recently received injection.  No further concerns at this time.  Denies new or worsening stroke/TIA symptoms.     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 History obtained from patient, wife and chart review. Reviewed all radiology images and labs personally.  Justin [name] is a 78 y.o. male with history of hypertension, hyperlipidemia, diabetes mellitus, prostate cancer, and previous cerebral hemorrhage who presented with right-sided weakness. He did not receive IV t-PA due to late presentation.  CT head reviewed and showed hypodensity within the left BG consistent with age-indeterminate infarct.  MRI head reviewed and showed acute/subacute nonhemorrhagic infarct involving the anterior left basal ganglia.  MRA head showed high-grade stenosis of the proximal left P1 segment with a prominent posterior communicating artery.  Carotid Doppler showed bilateral ICA stenosis of 1 to 39%.  2D echo showed an EF of 60 to 65% without cardiac source of embolus.  LDL 115 and recommend increase of Lipitor 40 mg daily to Lipitor 80 mg daily.  A1c 7.7 and recommended tight glycemic control with close PCP follow-up.  HTN stable during admission and recommended long-term BP goal normotensive range.  Patient was on aspirin 81 mg PTA and recommended aspirin 325 mg and Plavix for 3 months and then Plavix alone due to large and small vessel atherosclerosis including left M1 moderate stenosis.  Patient was discharged home with recommendations of home health PT.  10/20/2017 visit: Patient is being seen today for hospital follow-up and is accompanied by his wife.  He no longer has right-sided deficits and they have completely resolved but still has complaints of cognitive issues.  He does continue to work with home PT/OT/ST.  He continues taking  both aspirin and Plavix without side effects of bleeding or bruising.  Continues to take Lipitor without side effects myalgias.  Blood pressure today 142/83 but per patient typically 120s/80s.  He also monitors glucose levels at home which have been stable.  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  He denies doing additional activities such as mind exercises or continued speech therapies for continued cognitive concerns.  Wife states that he mainly sits and watches TV most of the day.  Highly encouraged and educated patient on importance of mind exercises and continued activity for improved cognition and prevent deconditioning with possible worsening right-sided weakness.  It was discussed that patient does snore at night and does experience daytime fatigue where it is easy for him to nap during the day despite adequate sleep at night prior. Denies new or worsening stroke/TIA symptoms.  01/04/2018 visit: Patient is being seen today for follow-up visit and is accompanied by his wife.  He continues to participate in speech therapy and physical therapy at our neuro rehab clinic.  He continues to have cognitive deficits which have been stable per wife.  Wife is concerned as he has decreased energy and lack of motivation to participate in activities.  She states that he typically will sit and watch TV during the day and does exhibit short temperament with her and irritability.  He will be undergoing neurocognitive testing on 01/12/2018.  Discussion resolving possible depression-like symptoms as patient states that he feels as though he does not have the energy to be productive or to stay active during the day.  Patient denies any prior history of depression.  Patient was evaluated by [facility] sleep clinic Dr. Rexene Alberts for possible OSA.  She did recommend sleep study but unfortunately multiple attempts have been made to schedule appointment and unable to contact them.  Patient states that if a number of calls him that he is unaware of, he will not answer phone call.  He is willing to schedule appointment for sleep study after today's appointment.  He has continued on both aspirin and Plavix despite recommendation of stopping  after 12/19/2017 as at that time he completed 3 months DAPT therapy but despite continued DAPT denies any bleeding or bruising.  Continues to take atorvastatin 80 mg without side effects of myalgias.  Lipid panel obtained on 12/01/2017 with LDL 77.  Blood pressure today satisfactory 135/83.  No further concerns at this time.  Denies new or worsening stroke/TIA symptoms.   ROS:   14 system review of systems performed and negative with exception of memory loss  PMH:  Past Medical History:  Diagnosis Date  . Brain bleed ([place])   . Cancer [facility])    prostate  . Diabetes mellitus without complication ([place])   . High cholesterol   . Hypertension   . Stroke [facility])     PSH:  Past Surgical History:  Procedure Laterality Date  . arm & hand surgery Left 1972   skill saw accident  . COLON SURGERY     partial  . EYE SURGERY     lens implant and cateract- Left  . PROSTATECTOMY      Social History:  Social History   Socioeconomic History  . Marital status: Married    Spouse name: Not on file  . Number of children: 1  . Years of education: Not on file  . Highest education level: High school graduate  Occupational History  . Not on file  Social Needs  . Financial resource strain: Not on file  . Food insecurity:    Worry: Not on file    Inability: Not on file  . Transportation needs:    Medical: Not on file    Non-medical: Not on file  Tobacco Use  . Smoking status: Never Smoker  . Smokeless tobacco: Never Used  Substance and Sexual Activity  . Alcohol use: Never    Frequency: Never  . Drug use: Never  . Sexual activity: Not on file  Lifestyle  . Physical activity:    Days per week: Not on file    Minutes per session: Not on file  . Stress: Not on file  Relationships  . Social connections:    Talks on phone: Not on file    Gets together: Not on file    Attends religious service: Not on file    Active member of club or organization: Not on file    Attends meetings of clubs  or organizations: Not on file    Relationship status: Not on file  . Intimate partner violence:    Fear of current or ex partner: Not on file    Emotionally abused: Not on file    Physically abused: Not on file    Forced sexual activity: Not on file  Other Topics Concern  . Not on file  Social History Narrative   Lives at home with his wife   Right handed   Caffeine: soft drinks 2-3 cups daily    Family History:  Family History  Problem Relation Age of Onset  . CAD Mother   . Diabetes Mother   . Emphysema Father   . Diabetes Sister   . Diabetes Brother   . Diabetes Sister   . Diabetes Brother   . Diabetes Mellitus II Neg Hx     Medications:   Current Outpatient Medications on File Prior to Visit  Medication Sig Dispense Refill  . atorvastatin (LIPITOR) 80 MG tablet Take 1 tablet (80 mg total) by mouth daily. 90 tablet 4  . Cholecalciferol (VITAMIN D-1000 MAX ST) 1000 units tablet Take 1,000 Units by mouth daily.    . citalopram (CELEXA) 10 MG tablet TAKE 1 TABLET BY MOUTH EVERY DAY 90 tablet 2  . clopidogrel (PLAVIX) 75 MG tablet Take 1 tablet (75 mg total) by mouth daily. 90 tablet 4  . cyanocobalamin ([facility] VITAMIN B12) 1000 MCG tablet Take 1,000 mcg by mouth daily.     . fenofibrate 160 MG tablet Take 160 mg by mouth daily.    . furosemide (LASIX) 20 MG tablet Take by mouth.    Marland Kitchen glipiZIDE (GLUCOTROL XL) 10 MG 24 hr tablet Take 10 mg by mouth daily with breakfast.    . hydrochlorothiazide (HYDRODIURIL) 25 MG tablet Take 25 mg by mouth daily.     Marland Kitchen losartan (COZAAR) 100 MG tablet Take 100 mg by mouth daily.    . metFORMIN (GLUCOPHAGE-XR) 500 MG 24 hr tablet Take 1,000 mg by mouth 2 (two) times daily.     . polyvinyl alcohol (LIQUIFILM TEARS) 1.4 % ophthalmic solution Place 1 drop into both eyes 4 (four) times daily as needed for dry eyes.    . Semaglutide (OZEMPIC) 0.25 or 0.5 MG/DOSE SOPN Inject 0.25 mg into the skin once a week.     No current facility-administered  medications on file prior to visit.     Allergies:  No Known Allergies  Physical Exam  Vitals:   04/06/18 1243  BP: (!) 148/78  Pulse: 60  Weight: 228 lb (103.4 kg)   Body mass index is 32.71 kg/m. No exam data present  General: Obese pleasant elderly Caucasian male, seated, in no evident distress Head: head normocephalic and atraumatic.   Neck: supple with no carotid or supraclavicular bruits Cardiovascular: regular rate and rhythm, no murmurs Musculoskeletal: no deformity Skin:  no rash/petichiae Vascular:  Normal pulses all extremities  Neurologic Exam Mental Status: Awake and fully alert.  Recall 1/3.  Animal naming 10 with prompting.  Disoriented to place and time.  Mood and affect appropriate during appointment and cooperative with exam. Cranial Nerves: Pupils equal, briskly reactive to light. Extraocular movements full without nystagmus. Visual fields full to confrontation. Hearing intact. Facial sensation intact. Face, tongue, palate moves normally and symmetrically.  Motor: Normal bulk and tone. Normal strength in all tested extremity muscles except for right hip flexor weakness from reported back pain Sensory.: intact to touch , pinprick , position and vibratory sensation.  Coordination: Rapid alternating movements normal in all extremities. Finger-to-nose and heel-to-shin performed accurately bilaterally. Gait and Station: Arises from chair without difficulty. Stance is normal. Gait demonstrates favoring of right leg due to back pain Reflexes: 1+ and symmetric. Toes downgoing.       Diagnostic Data (Labs, Imaging, Testing)  Ct Head Wo Contrast 09/17/2017 IMPRESSION:  1. Hypodensity within the left basal ganglia consistent with age indeterminate infarct, new since 05/31/2013 head CT. Negative for hemorrhage or mass.  2. Atrophy with progression of white matter small vessel ischemic disease. 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   Justin Brain wo  contrast Justin Virgel [name] Wo Contrast 09/18/2017 IMPRESSION:  1. Acute/subacute nonhemorrhagic infarct involving the anterior left basal ganglia measures 2.1 x 3.4 x 2.5 cm. Given the degree of atrophy, there is no significant associated mass effect.  2. Atrophy and diffuse white matter disease are moderately advanced for age. This is consistent with chronic microvascular ischemia.  3. Moderate proximal left M1 segment stenosis may be associated with the area of acute infarct as the origin of perforated arteries.  4. Moderate diffuse medium and small vessel disease as described.  5. 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  6. No emergent large vessel occlusion.    Transthoracic Echocardiogram  09/18/2017 Study Conclusions - Left ventricle: The cavity size was normal. Wall thickness was increased in a pattern of mild LVH. Systolic function was normal. The estimated ejection fraction was in the range of 60% to 65%. Wall motion was normal; there were no regional wall motion abnormalities. Doppler parameters are consistent with abnormal left ventricular relaxation (grade 1 diastolic dysfunction). The E/e&' ratio is <8, suggesting normal LV filling pressure. - Aortic valve: Sclerosis without stenosis. There was trivial regurgitation. - Mitral valve: Calcified annulus. Mildly thickened leaflets . There was trivial regurgitation. - Left atrium: The atrium was normal in size. - Inferior vena cava: The vessel was normal in size. The respirophasic diameter changes were in the normal range (>= 50%), consistent with normal central venous pressure. Impressions: - LVEF 60-65%, mild LVH, normal wall motion, grade 1 DD, normal LV filling pressure, trivial AI, Justin, normal LA size, normal IVC.   Bilateral Carotid Dopplers  09/18/2017 Preliminary report:1-39% ICA plaquing. Vertebral artery flow is antegrade.     ASSESSMENT: Justin [name] is a 78 y.o. 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 Vascular risk factors include DM, HTN, HLD and obesity.  He is being seen today for follow-up visit with residual cognitive deficits.    PLAN: -Continue clopidogrel 75 mg daily  and lipitor 80mg   for secondary stroke prevention -F/u with PCP regarding your HLD, HTN and DM management -Continue compliance with CPAP for OSA management -Highly encouraged memory exercises to assist with ongoing cognitive deficits -continue to monitor BP at home -advised to continue to stay active and maintain a healthy diet and promote weight loss -Maintain strict control of hypertension with blood pressure goal below 130/90, diabetes with hemoglobin A1c goal below 6.5% and cholesterol with LDL cholesterol (bad cholesterol) goal below 70 mg/dL. I also advised the patient to eat a healthy diet with plenty of whole grains, cereals, fruits and vegetables, exercise regularly and maintain ideal body weight.  Follow up in 6 months or call earlier if needed   Greater than 50% of time during this 25 minute visit was spent on counseling,explanation of diagnosis of left basal ganglia infarct, reviewing risk factor management of DM, HTN and HLD, planning of further management, discussion with patient and family and coordination of care    Venancio Poisson, AGNP-BC  [facility] [address] [place], [place] [address]  Phone [phone] Fax [phone] Note: This document was prepared with digital dictation and possible smart phrase technology. Any transcriptional errors that result from this process are unintentional.

## 2018-04-07 ENCOUNTER — Encounter: Payer: Self-pay | Admitting: Adult Health

## 2018-04-07 NOTE — Progress Notes (Signed)
I agree with the above plan 

## 2018-05-24 ENCOUNTER — Ambulatory Visit: Payer: Medicare HMO | Admitting: Neurology

## 2018-06-27 ENCOUNTER — Telehealth: Payer: Self-pay | Admitting: Adult Health

## 2018-06-27 NOTE — Telephone Encounter (Signed)
06-27-2018 Pt wife has called and gave verbal consent to file insurance for vv [email or web address] pt wife email is: [email or web address]  Pt understands that although there may be some limitations with this type of visit, we will take all precautions to reduce any security or privacy concerns.  Pt understands that this will be treated like an in office visit and we will file with pt's insurance, and there may be a patient responsible charge related to this service.

## 2018-06-28 NOTE — Telephone Encounter (Signed)
Due to current COVID 19 pandemic, our office is severely reducing in office visits until further notice, in order to minimize the risk to our patients and healthcare providers.   Called patient and spoke with patient's [name] to go over the [email or web address] process. She is aware of the e-mail that I have sent to her with the link for connection as well as directions. She is aware that she will receive a call from RN and front office staff.  Pt understands that although there may be some limitations with this type of visit, we will take all precautions to reduce any security or privacy concerns.  Pt understands that this will be treated like an in office visit and we will file with pt's insurance, and there may be a patient responsible charge related to this service.

## 2018-07-03 NOTE — Telephone Encounter (Signed)
I called pt. Pt's meds, allergies, and PMH were updated.  Pt will be using [email or web address] and asked that the link be texted to them.  Pt reports that his cpap is going well and that he feels better.

## 2018-07-04 ENCOUNTER — Encounter: Payer: Self-pay | Admitting: Neurology

## 2018-07-04 ENCOUNTER — Ambulatory Visit (INDEPENDENT_AMBULATORY_CARE_PROVIDER_SITE_OTHER): Payer: Medicare HMO | Admitting: Neurology

## 2018-07-04 ENCOUNTER — Other Ambulatory Visit: Payer: Self-pay

## 2018-07-04 DIAGNOSIS — Z9989 Dependence on other enabling machines and devices: Secondary | ICD-10-CM | POA: Diagnosis not present

## 2018-07-04 DIAGNOSIS — G4733 Obstructive sleep apnea (adult) (pediatric): Secondary | ICD-10-CM | POA: Diagnosis not present

## 2018-07-04 NOTE — Progress Notes (Signed)
Interim history:   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  The patient is accompanied by his wife today and joins from his cell phone at home, I am located in my office.  I first met him on 11/09/2016 at the request of Venancio Poisson, at which time he reported snoring and excessive daytime somnolence.  He was advised to proceed with sleep study testing.  He had a baseline sleep study, followed by a CPAP titration study.  His baseline sleep study from 01/17/2018 showed a decreased sleep efficiency and prolonged latency to sleep, AHI was 8.1/h, O2 nadir 83%, CPAP titration study in January 2020 showed reasonably good results with CPAP of 11 cm, sleep efficiency was somewhat improved, sleep latency delayed at 122.5 minutes.  O2 nadir was 94% for the night.  Today, 07/04/2018: Please also see below for virtual visit documentation.  I reviewed his CPAP compliance data from 05/29/2018 through 06/27/2018 which is a total of 30 days, during which time he used his machine only 14 days with percent used days greater than 4 hours at 43.3%, indicating slightly suboptimal compliance with an average usage of 7 hours and 8 minutes for days on treatment, 3 hours and 19 minutes for all days, average AHI at goal at 1.7/h, treatment pressure at 11 cm.  His best compliance was right after set up, set up date was 03/08/2018.  His compliance in the month of mid January through mid February was 96.7% for more than 4 hours.   Previously:   11/09/2017: (He) reports snoring and excessive daytime somnolence. I reviewed your office note from  10/20/2017. His Epworth sleepiness score is 24 out of 24, fatigue score is 36 out of 63. He is retired, lives with his wife, they have 1 child. He is a nonsmoker and does not utilize alcohol, drinks caffeine in the form of diet soda. He is trying to hydrate well. He is minimally verbal, not very forthcoming with information but is answering questions appropriately. His bedtime is around 11 PM by his report, his wife adds that he falls asleep in his recliner in the family room on most evenings and may not go to bed at all or eventually go to bed in the early morning hours. He has nocturia once per average night. Some answers he only gives by hand gestures. He denies morning headaches, rise time between 6 and 7 AM typically. He is retired from [employer]. He never smoked, does not report a family history of sleep apnea, has been trying to lose weight, and the past 2 years he has lost about 40 pounds. He has never had a tonsillectomy. His wife has noted pauses in his breathing while he is asleep. He has finished outpatient therapy but may be restarting occupational therapy and speech therapy. His Past Medical History Is Significant For: Past Medical History:  Diagnosis Date  . Brain bleed ([place])   . Cancer [facility])    prostate  . Diabetes mellitus without complication ([place])   . High cholesterol   . Hypertension   . Stroke [facility])     His Past Surgical History Is Significant For: Past Surgical History:  Procedure Laterality Date  . arm & hand surgery Left 1972   skill saw accident  . COLON SURGERY     partial  . EYE SURGERY     lens implant and cateract- Left  . PROSTATECTOMY      His Family History Is Significant For: Family History  Problem Relation Age of Onset  . CAD Mother   . Diabetes Mother   . Emphysema Father   . Diabetes Sister   . Diabetes Brother   . Diabetes Sister   . Diabetes Brother   . Diabetes Mellitus II Neg Hx     His Social History Is Significant For: Social History    Socioeconomic History  . Marital status: Married    Spouse name: Not on file  . Number of children: 1  . Years of education: Not on file  . Highest education level: High school graduate  Occupational History  . Not on file  Social Needs  . Financial resource strain: Not on file  . Food insecurity:    Worry: Not on file    Inability: Not on file  . Transportation needs:    Medical: Not on file    Non-medical: Not on file  Tobacco Use  . Smoking status: Never Smoker  . Smokeless tobacco: Never Used  Substance and Sexual Activity  . Alcohol use: Never    Frequency: Never  . Drug use: Never  . Sexual activity: Not on file  Lifestyle  . Physical activity:    Days per week: Not on file    Minutes per session: Not on file  . Stress: Not on file  Relationships  . Social connections:    Talks on phone: Not on file    Gets together: Not on file    Attends religious service: Not on file    Active member of club or organization: Not on file    Attends meetings of clubs or organizations: Not on file    Relationship status: Not on file  Other Topics Concern  . Not on file  Social History Narrative   Lives at home with his wife   Right handed   Caffeine: soft drinks 2-3 cups daily    His Allergies Are:  No Known Allergies:   His Current Medications Are:  Outpatient Encounter Medications as of 07/04/2018  Medication Sig  . atorvastatin (LIPITOR) 80 MG tablet Take 1 tablet (80 mg total) by mouth daily.  . Cholecalciferol (VITAMIN D-1000 MAX ST) 1000 units tablet Take 1,000 Units by mouth daily.  . citalopram (CELEXA) 10 MG tablet TAKE 1 TABLET BY MOUTH EVERY DAY  . clopidogrel (PLAVIX) 75 MG tablet Take 1 tablet (75 mg total) by mouth daily.  . cyanocobalamin ([employer]S VITAMIN B12) 1000 MCG tablet Take 1,000 mcg by mouth daily.   . fenofibrate 160 MG tablet Take 160 mg by mouth daily.  . furosemide (LASIX) 20 MG tablet Take by mouth.  Marland Kitchen glipiZIDE (GLUCOTROL XL) 10 MG 24 hr  tablet Take 10 mg by mouth daily with breakfast.  . hydrochlorothiazide (HYDRODIURIL) 25 MG tablet Take 25 mg by mouth daily.   Marland Kitchen losartan (COZAAR) 100 MG tablet Take 100 mg by mouth daily.  . metFORMIN (GLUCOPHAGE-XR) 500 MG 24 hr tablet Take 1,000 mg by mouth 2 (two) times daily.   . polyvinyl alcohol (LIQUIFILM TEARS) 1.4 % ophthalmic solution Place 1 drop into both eyes 4 (four) times daily as needed for dry eyes.  . Semaglutide (OZEMPIC) 0.25 or  0.5 MG/DOSE SOPN Inject 0.25 mg into the skin once a week.   No facility-administered encounter medications on file as of 07/04/2018.   :  Review of Systems:  Out of a complete 14 point review of systems, all are reviewed and negative with the exception of these symptoms as listed below:  Virtual Visit via Video Note on 07/04/2018:  I connected with Mr and Mrs Anderle on 07/04/18 at  1:00 PM EDT by a video enabled telemedicine application and verified that I am speaking with the correct person using two identifiers.   I discussed the limitations of evaluation and management by telemedicine and the availability of in person appointments. The patient expressed understanding and agreed to proceed.  History of Present Illness:  He reports difficulty with the machine, felt it was not working right, he had more leak and it felt it was louder than it used to be.  He went into his DME company office today and had a mask adjustment.  He will start using it again.  His wife reports that he sleeps in a separate bedroom and sometimes she can hear the leakage and his snoring.  He feels that he has slept a little better with using the CPAP and is motivated to continue with treatment.  He has no acute issues to discuss, he is quite hard of hearing and his wife is facilitating this appointment.    Observations/Objective: On examination, he is pleasant, conversant but very hard of hearing, his wife is helping with the visit.  He is in no acute distress.  Speech is  minimal, no obvious dysarthria, hypophonia or voice tremor noted.  Face is symmetric, extraocular movements well-preserved.  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  He has established treatment with CPAP, he is currently not fully compliant with it, has had some problems with the machine, making louder noises, some air leakage from the mask, had a appointment with the DME provider today and hopefully will be able to get back on track with treatment.  He indicates better sleep consolidation and restful sleep, less daytime tiredness when he is able to use his CPAP, he was quite compliant with in the first month of treatment with a compliance percentage of nearly 97% at the time.  He is encouraged to continue with full compliance with his CPAP and follow-up routinely as previously scheduled with the nurse practitioner in August 2020.  I answered all their questions today and the patient and his wife were in agreement  Follow Up Instructions:    I discussed the assessment and treatment plan with the patient. The patient was provided an opportunity to ask questions and all were answered. The patient agreed with the plan and demonstrated an understanding of the instructions.   The patient was advised to call back or seek an in-person evaluation if the symptoms worsen or if the condition fails to improve  as  anticipated.   I provided 15 minutes of non-face-to-face time during this encounter.   Star Age, MD

## 2018-07-04 NOTE — Patient Instructions (Signed)
Given verbally, during today's virtual video-based encounter, with verbal feedback received.   

## 2018-10-03 ENCOUNTER — Observation Stay (HOSPITAL_COMMUNITY): Payer: Medicare HMO

## 2018-10-03 ENCOUNTER — Inpatient Hospital Stay (HOSPITAL_COMMUNITY)
Admission: EM | Admit: 2018-10-03 | Discharge: 2018-10-04 | DRG: 066 | Disposition: A | Discharge status: Home Health | Payer: Medicare HMO | Attending: Internal Medicine | Admitting: Internal Medicine

## 2018-10-03 ENCOUNTER — Other Ambulatory Visit: Payer: Self-pay

## 2018-10-03 ENCOUNTER — Encounter (HOSPITAL_COMMUNITY): Payer: Self-pay

## 2018-10-03 ENCOUNTER — Emergency Department (HOSPITAL_COMMUNITY): Payer: Medicare HMO

## 2018-10-03 DIAGNOSIS — Z7984 Long term (current) use of oral hypoglycemic drugs: Secondary | ICD-10-CM

## 2018-10-03 DIAGNOSIS — I672 Cerebral atherosclerosis: Secondary | ICD-10-CM | POA: Diagnosis present

## 2018-10-03 DIAGNOSIS — G4733 Obstructive sleep apnea (adult) (pediatric): Secondary | ICD-10-CM | POA: Diagnosis present

## 2018-10-03 DIAGNOSIS — Z8546 Personal history of malignant neoplasm of prostate: Secondary | ICD-10-CM

## 2018-10-03 DIAGNOSIS — R471 Dysarthria and anarthria: Secondary | ICD-10-CM | POA: Diagnosis present

## 2018-10-03 DIAGNOSIS — I4510 Unspecified right bundle-branch block: Secondary | ICD-10-CM | POA: Diagnosis present

## 2018-10-03 DIAGNOSIS — Z8249 Family history of ischemic heart disease and other diseases of the circulatory system: Secondary | ICD-10-CM

## 2018-10-03 DIAGNOSIS — Z9079 Acquired absence of other genital organ(s): Secondary | ICD-10-CM

## 2018-10-03 DIAGNOSIS — R29700 NIHSS score 0: Secondary | ICD-10-CM | POA: Diagnosis present

## 2018-10-03 DIAGNOSIS — I639 Cerebral infarction, unspecified: Secondary | ICD-10-CM | POA: Diagnosis present

## 2018-10-03 DIAGNOSIS — I10 Essential (primary) hypertension: Secondary | ICD-10-CM | POA: Diagnosis not present

## 2018-10-03 DIAGNOSIS — R410 Disorientation, unspecified: Secondary | ICD-10-CM

## 2018-10-03 DIAGNOSIS — Z20828 Contact with and (suspected) exposure to other viral communicable diseases: Secondary | ICD-10-CM | POA: Diagnosis present

## 2018-10-03 DIAGNOSIS — F32A Depression, unspecified: Secondary | ICD-10-CM

## 2018-10-03 DIAGNOSIS — E1151 Type 2 diabetes mellitus with diabetic peripheral angiopathy without gangrene: Secondary | ICD-10-CM | POA: Diagnosis present

## 2018-10-03 DIAGNOSIS — I6389 Other cerebral infarction: Secondary | ICD-10-CM | POA: Diagnosis not present

## 2018-10-03 DIAGNOSIS — Z961 Presence of intraocular lens: Secondary | ICD-10-CM | POA: Diagnosis present

## 2018-10-03 DIAGNOSIS — F329 Major depressive disorder, single episode, unspecified: Secondary | ICD-10-CM

## 2018-10-03 DIAGNOSIS — Z8673 Personal history of transient ischemic attack (TIA), and cerebral infarction without residual deficits: Secondary | ICD-10-CM

## 2018-10-03 DIAGNOSIS — Z9842 Cataract extraction status, left eye: Secondary | ICD-10-CM

## 2018-10-03 DIAGNOSIS — I77810 Thoracic aortic ectasia: Secondary | ICD-10-CM | POA: Diagnosis present

## 2018-10-03 DIAGNOSIS — R4701 Aphasia: Secondary | ICD-10-CM | POA: Diagnosis present

## 2018-10-03 DIAGNOSIS — Z833 Family history of diabetes mellitus: Secondary | ICD-10-CM

## 2018-10-03 DIAGNOSIS — E785 Hyperlipidemia, unspecified: Secondary | ICD-10-CM | POA: Diagnosis present

## 2018-10-03 DIAGNOSIS — T45526A Underdosing of antithrombotic drugs, initial encounter: Secondary | ICD-10-CM | POA: Diagnosis present

## 2018-10-03 DIAGNOSIS — G9389 Other specified disorders of brain: Secondary | ICD-10-CM | POA: Diagnosis present

## 2018-10-03 DIAGNOSIS — Z825 Family history of asthma and other chronic lower respiratory diseases: Secondary | ICD-10-CM

## 2018-10-03 DIAGNOSIS — E119 Type 2 diabetes mellitus without complications: Secondary | ICD-10-CM

## 2018-10-03 DIAGNOSIS — Z79899 Other long term (current) drug therapy: Secondary | ICD-10-CM

## 2018-10-03 DIAGNOSIS — Z7902 Long term (current) use of antithrombotics/antiplatelets: Secondary | ICD-10-CM

## 2018-10-03 DIAGNOSIS — E7800 Pure hypercholesterolemia, unspecified: Secondary | ICD-10-CM | POA: Diagnosis present

## 2018-10-03 DIAGNOSIS — Y929 Unspecified place or not applicable: Secondary | ICD-10-CM

## 2018-10-03 DIAGNOSIS — R4781 Slurred speech: Secondary | ICD-10-CM

## 2018-10-03 HISTORY — DX: Depression, unspecified: F32.A

## 2018-10-03 LAB — URINALYSIS, ROUTINE W REFLEX MICROSCOPIC
Bilirubin Urine: NEGATIVE
Glucose, UA: NEGATIVE mg/dL
Hgb urine dipstick: NEGATIVE
Ketones, ur: NEGATIVE mg/dL
Leukocytes,Ua: NEGATIVE
Nitrite: NEGATIVE
Protein, ur: NEGATIVE mg/dL
Specific Gravity, Urine: 1.015 (ref 1.005–1.030)
pH: 7 (ref 5.0–8.0)

## 2018-10-03 LAB — COMPREHENSIVE METABOLIC PANEL
ALT: 16 U/L (ref 0–44)
AST: 18 U/L (ref 15–41)
Albumin: 3.9 g/dL (ref 3.5–5.0)
Alkaline Phosphatase: 32 U/L — ABNORMAL LOW (ref 38–126)
Anion gap: 12 (ref 5–15)
BUN: 17 mg/dL (ref 8–23)
CO2: 24 mmol/L (ref 22–32)
Calcium: 9.8 mg/dL (ref 8.9–10.3)
Chloride: 102 mmol/L (ref 98–111)
Creatinine, Ser: 1.13 mg/dL (ref 0.61–1.24)
GFR calc Af Amer: >60 mL/min (ref >60)
GFR calc non Af Amer: >60 mL/min (ref >60)
Glucose, Bld: 90 mg/dL (ref 70–99)
Potassium: 3.8 mmol/L (ref 3.5–5.1)
Sodium: 138 mmol/L (ref 135–145)
Total Bilirubin: 1.1 mg/dL (ref 0.3–1.2)
Total Protein: 6.6 g/dL (ref 6.5–8.1)

## 2018-10-03 LAB — I-STAT CHEM 8, ED
BUN: 18 mg/dL (ref 8–23)
Calcium, Ion: 1.13 mmol/L — ABNORMAL LOW (ref 1.15–1.40)
Chloride: 100 mmol/L (ref 98–111)
Creatinine, Ser: 1 mg/dL (ref 0.61–1.24)
Glucose, Bld: 85 mg/dL (ref 70–99)
HCT: 46 % (ref 39.0–52.0)
Hemoglobin: 15.6 g/dL (ref 13.0–17.0)
Potassium: 3.7 mmol/L (ref 3.5–5.1)
Sodium: 137 mmol/L (ref 135–145)
TCO2: 25 mmol/L (ref 22–32)

## 2018-10-03 LAB — DIFFERENTIAL
Abs Immature Granulocytes: 0.05 10*3/uL (ref 0.00–0.07)
Basophils Absolute: 0.1 10*3/uL (ref 0.0–0.1)
Basophils Relative: 1 %
Eosinophils Absolute: 0.3 10*3/uL (ref 0.0–0.5)
Eosinophils Relative: 3 %
Immature Granulocytes: 1 %
Lymphocytes Relative: 28 %
Lymphs Abs: 2.6 10*3/uL (ref 0.7–4.0)
Monocytes Absolute: 0.7 10*3/uL (ref 0.1–1.0)
Monocytes Relative: 7 %
Neutro Abs: 5.5 10*3/uL (ref 1.7–7.7)
Neutrophils Relative %: 60 %

## 2018-10-03 LAB — CBC
HCT: 46.2 % (ref 39.0–52.0)
Hemoglobin: 15.7 g/dL (ref 13.0–17.0)
MCH: 28.2 pg (ref 26.0–34.0)
MCHC: 34 g/dL (ref 30.0–36.0)
MCV: 83.1 fL (ref 80.0–100.0)
Platelets: 285 10*3/uL (ref 150–400)
RBC: 5.56 MIL/uL (ref 4.22–5.81)
RDW: 12.2 % (ref 11.5–15.5)
WBC: 9.1 10*3/uL (ref 4.0–10.5)
nRBC: 0 % (ref 0.0–0.2)

## 2018-10-03 LAB — RAPID URINE DRUG SCREEN, HOSP PERFORMED
Amphetamines: NOT DETECTED
Barbiturates: NOT DETECTED
Benzodiazepines: NOT DETECTED
Cocaine: NOT DETECTED
Opiates: NOT DETECTED
Tetrahydrocannabinol: NOT DETECTED

## 2018-10-03 LAB — PROTIME-INR
INR: 1.1 (ref 0.8–1.2)
Prothrombin Time: 14 seconds (ref 11.4–15.2)

## 2018-10-03 LAB — SARS CORONAVIRUS 2 BY RT PCR (HOSPITAL ORDER, PERFORMED IN ~~LOC~~ HOSPITAL LAB): SARS Coronavirus 2: NEGATIVE

## 2018-10-03 LAB — APTT: aPTT: 26 seconds (ref 24–36)

## 2018-10-03 LAB — CBG MONITORING, ED
Glucose-Capillary: 77 mg/dL (ref 70–99)
Glucose-Capillary: 73 mg/dL (ref 70–99)

## 2018-10-03 LAB — ETHANOL: Alcohol, Ethyl (B): <10 mg/dL (ref <10)

## 2018-10-03 MED ORDER — INSULIN ASPART 100 UNIT/ML ~~LOC~~ SOLN
0.0000 [IU] | Freq: Three times a day (TID) | SUBCUTANEOUS | Status: DC
  Administered 2018-10-04 (×2): 1 [IU] via SUBCUTANEOUS

## 2018-10-03 MED ORDER — INSULIN ASPART 100 UNIT/ML ~~LOC~~ SOLN: 0.0000 [IU] | Freq: Every day | SUBCUTANEOUS | Status: DC

## 2018-10-03 MED ORDER — ACETAMINOPHEN 325 MG PO TABS: 650.0000 mg | ORAL_TABLET | Freq: Four times a day (QID) | ORAL | Status: DC | PRN

## 2018-10-03 MED ORDER — ENOXAPARIN SODIUM 40 MG/0.4ML ~~LOC~~ SOLN
40.0000 mg | SUBCUTANEOUS | Status: DC
  Administered 2018-10-03: 40 mg via SUBCUTANEOUS
  Filled 2018-10-03: qty 0.4

## 2018-10-03 MED ORDER — ACETAMINOPHEN 650 MG RE SUPP: 650.0000 mg | Freq: Four times a day (QID) | RECTAL | Status: DC | PRN

## 2018-10-03 NOTE — ED Triage Notes (Signed)
Pt brought by [facility] per ems pt started having aphasia today. LKW 0800. Per EMS pt does not have any other neuro deficits.  Pt is A&Ox4. Vs wnl. NSR w/BBB on monitor. NIH 0

## 2018-10-03 NOTE — ED Provider Notes (Signed)
[place] EMERGENCY DEPARTMENT Provider Note   CSN: [id number] Arrival date & time: 10/03/18  1614     History   Chief Complaint Chief Complaint  Patient presents with  . Aphasia    HPI Justin [name] is a 78 y.o. male.     HPI The patient's wife reports that at about 10 AM his speech sounded thick to her.  It was not his normal.  He was having a hard time answering certain questions.  She reports he did not have any evident weakness that she could identify.  He did not have a problem with his gait or using his extremities.  She reports this is not normal for him.  She reports typically his cognitive function and speech are without deficits.  He reports he does try to minimize symptoms.  She also reports that he has been noncompliant with his medications for probably up to 3 weeks.  He has not been taking his Plavix or other medications.  She reports she took him to the family doctor about 3 weeks ago and he said he would restart his medications but he did for about a week and then quit again.  Patient is denying he has any pain.  He denies that he is aware of any symptoms. Past Medical History:  Diagnosis Date  . Brain bleed ([place])   . Cancer [facility])    prostate  . Diabetes mellitus without complication ([place])   . High cholesterol   . Hypertension   . Stroke [facility])     Patient Active Problem List   Diagnosis Date Noted  . Acute CVA (cerebrovascular accident) ([place]) 09/18/2017  . Stroke (cerebrum) ([place]) 09/17/2017  . Diabetes mellitus without complication ([place]) 79/89/2119  . Closed head injury 04/22/2013  . HLD (hyperlipidemia) 07/29/2009  . Essential hypertension 07/29/2009  . EFFUSION, PLEURAL 07/29/2009  . DIVERTICULITIS, COLON 07/29/2009  . 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 COLONIC POLYPS, HYPERPLASTIC, HX OF 07/29/2009    Past Surgical History:  Procedure Laterality Date  . arm & hand surgery Left 1972   skill saw accident  . COLON SURGERY     partial  . EYE SURGERY     lens implant and cateract- Left  . PROSTATECTOMY          Home Medications    Prior to Admission medications   Medication Sig Start Date End Date Taking? Authorizing Provider  atorvastatin (LIPITOR) 80 MG tablet Take 1 tablet (80 mg total) by mouth daily. 10/20/17   Claris Gower, NP  Cholecalciferol (VITAMIN D-1000 MAX ST) 1000 units tablet Take 1,000 Units by mouth daily.    [provider]  citalopram (CELEXA) 10 MG tablet TAKE 1 TABLET BY MOUTH EVERY DAY 01/30/18   Claris Gower, NP  clopidogrel (PLAVIX) 75 MG tablet Take 1 tablet (75 mg total) by mouth daily. 10/20/17   Claris Gower, NP  cyanocobalamin ([employer]S VITAMIN B12) 1000 MCG tablet Take 1,000 mcg by mouth daily.     [provider]  fenofibrate 160 MG tablet Take 160 mg by mouth daily. 10/08/16 04/06/18  [provider]  furosemide (LASIX) 20 MG tablet Take by mouth. 10/04/16 12/29/18  [provider]  glipiZIDE (GLUCOTROL XL) 10 MG 24 hr tablet Take 10 mg by mouth daily with breakfast.    [provider]  hydrochlorothiazide (HYDRODIURIL) 25 MG tablet Take 25 mg by mouth daily.     [provider]  losartan (COZAAR) 100 MG tablet Take 100 mg  by mouth daily.    [provider]  metFORMIN (GLUCOPHAGE-XR) 500 MG 24 hr tablet Take 1,000 mg by mouth 2 (two) times daily.     [provider]  polyvinyl alcohol (LIQUIFILM TEARS) 1.4 % ophthalmic solution Place 1 drop into both eyes 4 (four) times daily as needed for dry eyes.    [provider]  Semaglutide (OZEMPIC) 0.25 or 0.5 MG/DOSE SOPN Inject 0.25 mg into the skin once a week.    [provider]    Family History Family History  Problem Relation Age of Onset  . CAD Mother   . Diabetes Mother   . Emphysema Father   . Diabetes Sister   . Diabetes Brother   . Diabetes Sister   . Diabetes Brother   . Diabetes Mellitus II Neg Hx     Social History Social  History   Tobacco Use  . Smoking status: Never Smoker  . Smokeless tobacco: Never Used  Substance Use Topics  . Alcohol use: Never    Frequency: Never  . Drug use: Never     Allergies   Patient has no known allergies.   Review of Systems Review of Systems 10 Systems reviewed and are negative for acute change except as noted in the HPI.  Physical Exam Updated Vital Signs BP (!) 144/80   Pulse 68   Temp 98.7 F (37.1 C) (Oral)   Resp 18   Ht 6\' 4"  (1.93 m)   Wt 98.9 kg   SpO2 95%   BMI 26.54 kg/m   Physical Exam Constitutional:      Comments: Patient is alert and in no acute distress.  Nontoxic.  HENT:     Head: Normocephalic and atraumatic.     Mouth/Throat:     Mouth: Mucous membranes are moist.     Pharynx: Oropharynx is clear.  Eyes:     Extraocular Movements: Extraocular movements intact.     Conjunctiva/sclera: Conjunctivae normal.     Pupils: Pupils are equal, round, and reactive to light.  Cardiovascular:     Rate and Rhythm: Normal rate and regular rhythm.  Pulmonary:     Effort: Pulmonary effort is normal.     Breath sounds: Normal breath sounds.  Abdominal:     General: There is no distension.     Palpations: Abdomen is soft.     Tenderness: There is no abdominal tenderness. There is no guarding.  Musculoskeletal: Normal range of motion.        General: No swelling or tenderness.  Skin:    General: Skin is warm and dry.  Neurological:     Comments: Patient is alert and answering questions.  He is sometimes slow to respond.  Patient was able to accurately give the year but not the month.  He follows all commands.  There is no focal motor deficit.  He is correct in all object naming exercise.  Psychiatric:        Mood and Affect: Mood normal.      ED Treatments / Results  Labs (all labs ordered are listed, but only abnormal results are displayed) Labs Reviewed  COMPREHENSIVE METABOLIC PANEL - Abnormal; Notable for the following components:       Result Value   Alkaline Phosphatase 32 (*)    All other components within normal limits  I-STAT CHEM 8, ED - Abnormal; Notable for the following components:   Calcium, Ion 1.13 (*)    All other components within normal limits  ETHANOL  PROTIME-INR  APTT  CBC  DIFFERENTIAL  RAPID URINE DRUG SCREEN, HOSP PERFORMED  URINALYSIS, ROUTINE W REFLEX MICROSCOPIC    EKG EKG Interpretation  Date/Time:  Tuesday October 03 2018 16:18:36 EDT Ventricular Rate:  66 PR Interval:    QRS Duration: 149 QT Interval:  404 QTC Calculation: 424 R Axis:   -41 Text Interpretation:  Sinus rhythm Consider left atrial enlargement Right bundle branch block nno change Confirmed by Charlesetta Shanks [phone]) on 10/03/2018 4:24:01 PM   Radiology Ct Head Wo Contrast  Result Date: 10/03/2018 CLINICAL DATA:  Aphasia. EXAM: CT HEAD WITHOUT CONTRAST TECHNIQUE: Contiguous axial images were obtained from the base of the skull through the vertex without intravenous contrast. COMPARISON:  CT head dated December 07, 2017. FINDINGS: Brain: No evidence of acute infarction, hemorrhage, hydrocephalus, extra-axial collection or mass lesion/mass effect. Stable atrophy and chronic microvascular ischemic changes. Old bilateral basal ganglia infarcts. New small lacunar infarct in the right thalamus, likely chronic. Vascular: Atherosclerotic vascular calcification of the carotid siphons. No hyperdense vessel or unexpected calcification. Skull: Normal. Negative for fracture or focal lesion. Sinuses/Orbits: No acute finding. Other: None. IMPRESSION: 1.  No acute intracranial abnormality. 2. Small lacunar infarct in the right thalamus, new since the prior study, but likely chronic. 3. Old bilateral basal ganglia infarcts. 4. Stable atrophy and chronic microvascular ischemic changes. Electronically Signed   By: Titus Dubin M.D.   On: 10/03/2018 19:09    Procedures Procedures (including critical care time)  Medications Ordered in ED  Medications - No data to display   Initial Impression / Assessment and Plan / ED Course  I have reviewed the triage vital signs and the nursing notes.  Pertinent labs & imaging results that were available during my care of the patient were reviewed by me and considered in my medical decision making (see chart for details).  Clinical Course as of Oct 02 2104  Tue Oct 03, 2018  2010 Consult: Reviewed with Dr. Cheral Marker of neurology.  Advises that symptoms sound less like strokes and other potential mental start change etiologies.  Patient however did have abrupt change per his wife at 10 AM.  Will plan to admit for mental status change and get MRI to rule out stroke or other etiology.  Dr. Cheral Marker advises may be the sequelae of old stroke and cerebral atrophy.   [MP]  2030 Patient has been updated of plan and results.   [MP]    Clinical Course User Index [MP] Charlesetta Shanks, MD      [name] noted him to have slowed and slurred speech.  Objectively, patient seems slightly cognitively delayed.  He does interact appropriately but has some recall problems.  He however he recognizes location and he has home address.  No focal motor deficits present.  I did have a conversation his wife and she reports this is not typical for him cognitively speaking.  Neurology has been consulted.  We will plan for admission for further diagnostic evaluation of mental status\cognitive change.  Differential diagnosis includes CVA or other encephalopathic process.  Patient does not appear to have infectious etiology.  Consult: Tried hospitalist for admission.  Final Clinical Impressions(s) / ED Diagnoses   Final diagnoses:  Slurred speech    ED Discharge Orders    None       Charlesetta Shanks, MD 10/10/18 [id number]

## 2018-10-03 NOTE — ED Notes (Signed)
Patient transported to MRI 

## 2018-10-03 NOTE — ED Notes (Signed)
Please call pt's wife Tydus Sanmiguel at [phone] to give update on pt.

## 2018-10-03 NOTE — ED Notes (Signed)
Pt moved to hospital bed for comfort.

## 2018-10-03 NOTE — Consult Note (Signed)
NEURO HOSPITALIST CONSULT NOTE   Requestig physician: Dr. Johnney Killian  Reason for Consult: Confusion with speech deficit  History obtained from:   Chart   HPI:                                                                                                                                          Justin [name] is an 78 y.o. male with a PMHx of ICH, DM, prostate CA, stroke and HTN who presents with new onset confusion beginning this morning at about 10 AM. His wife noticed speech deficit, but patient states that he is unaware of any trouble speaking or understanding. Wife endorsed that the patient's speech sounded "thick" and he was having difficulty answering certain questions. She denied gait difficulty or visible weakness. At baseline his speech and cognition are normal. He has a tendency to minimize symptoms per wife. He has not been compliant with his medications for about the past 3 weeks, including his Plavix.   The patient states that he has no symptoms of any kind currently, including no headache, vision loss, speech deficit, neck pain, CP, SOB, cough, fever, incontinence, belly pain, limb weakness or limb numbness.   Past Medical History:  Diagnosis Date  . Brain bleed ([place])   . Cancer [facility])    prostate  . Diabetes mellitus without complication ([place])   . High cholesterol   . Hypertension   . Stroke [facility])     Past Surgical History:  Procedure Laterality Date  . arm & hand surgery Left 1972   skill saw accident  . COLON SURGERY     partial  . EYE SURGERY     lens implant and cateract- Left  . PROSTATECTOMY      Family History  Problem Relation Age of Onset  . CAD Mother   . Diabetes Mother   . Emphysema Father   . Diabetes Sister   . Diabetes Brother   . Diabetes Sister   . Diabetes Brother   . Diabetes Mellitus II Neg Hx               Social History:  reports that he has never smoked. He has never used smokeless tobacco. He reports that he does  not drink alcohol or use drugs.  No Known Allergies  HOME MEDICATIONS:  ROS:                                                                                                                                       As per HPI. Comprehensive ROS is otherwise negative.    Blood pressure (!) 144/80, pulse 68, temperature 98.7 F (37.1 C), temperature source Oral, resp. rate 18, height '6\' 4"'  (1.93 m), weight 98.9 kg, SpO2 95 %.   General Examination:                                                                                                       Physical Exam  HEENT-  [place]/AT   Lungs- Respirations unlabored  Extremities- No edema  Neurological Examination Mental Status: Awake and alert. Poorly oriented to time and place. Abulic/bradyphrenic with increased latencies of verbal responses to questions. Has good eye contact and mood is euthymic. Naming intact for common objects. Repetition intact. Makes one error on a 3 step directional command. Speech fluent with no errors of grammar or syntax. Mild/subtle pharyngeal dysarthria. Cranial Nerves: II:  Visual fields intact to DSS without extinction. Fixates and tracks normally. PERRL. III,IV, VI: No ptosis. EOMI with saccadic pursuits noted. No nystagmus.  V,VII: Smile symmetric. Facial temp sensation equal bilaterally VIII: hearing intact to voice IX,X: Palate elevates symmetrically  XI: Head is midline XII: midline tongue extension Motor: Right : Upper extremity   5/5    Left:     Upper extremity   5/5  Lower extremity   5/5     Lower extremity   5/5 No pronator drift.  Mild bradykinesia diffusely.  Sensory: Temp and light touch intact x 4 with no extinction to DSS.  Deep Tendon Reflexes: 2+ and symmetric brachioradialis, biceps and patellae. 0 achilles bilaterally.  Cerebellar: Action tremor bilaterally with FNF,  slightly more prominent on the left. No ataxia noted.  Gait: Deferred   Lab Results: Basic Metabolic Panel: Recent Labs  Lab 10/03/18 1623 10/03/18 1635  NA 138 137  K 3.8 3.7  CL 102 100  CO2 24  --   GLUCOSE 90 85  BUN 17 18  CREATININE 1.13 1.00  CALCIUM 9.8  --     CBC: Recent Labs  Lab 10/03/18 1623 10/03/18 1635  WBC 9.1  --   NEUTROABS 5.5  --   HGB 15.7 15.6  HCT 46.2 46.0  MCV 83.1  --   PLT 285  --     Cardiac Enzymes: No  results for input(s): CKTOTAL, CKMB, CKMBINDEX, TROPONINI in the last 168 hours.  Lipid Panel: No results for input(s): CHOL, TRIG, HDL, CHOLHDL, VLDL, LDLCALC in the last 168 hours.  Imaging: Ct Head Wo Contrast  Result Date: 10/03/2018 CLINICAL DATA:  Aphasia. EXAM: CT HEAD WITHOUT CONTRAST TECHNIQUE: Contiguous axial images were obtained from the base of the skull through the vertex without intravenous contrast. COMPARISON:  CT head dated December 07, 2017. FINDINGS: Brain: No evidence of acute infarction, hemorrhage, hydrocephalus, extra-axial collection or mass lesion/mass effect. Stable atrophy and chronic microvascular ischemic changes. Old bilateral basal ganglia infarcts. New small lacunar infarct in the right thalamus, likely chronic. Vascular: Atherosclerotic vascular calcification of the carotid siphons. No hyperdense vessel or unexpected calcification. Skull: Normal. Negative for fracture or focal lesion. Sinuses/Orbits: No acute finding. Other: None. IMPRESSION: 1.  No acute intracranial abnormality. 2. Small lacunar infarct in the right thalamus, new since the prior study, but likely chronic. 3. Old bilateral basal ganglia infarcts. 4. Stable atrophy and chronic microvascular ischemic changes. Electronically Signed   By: Titus Dubin M.D.   On: 10/03/2018 19:09    Assessment: 78 year old male with new onset of speech deficit and confusion 1. Exam shows no lateralized deficit and best localizes as diffuse cortical/subcortical  dysfunction. DDx includes cognitive fluctuation in setting of possible incipient dementia (prominent atrophy is present on CT head), subclinical or unwitnessed seizure with postictal state, and atypical presentation for stroke. His bradykinesia and action tremor suggest possible underlying Parkinsonism - of note, this can present with cognitive fluctuations.  2. CT head shows no acute intracranial abnormality. A  small lacunar infarct in the right thalamus, new since the prior study, but likely chronic is noted, in addition to old bilateral basal ganglia infarcts, stable atrophy and chronic microvascular ischemic changes. 3. Medication noncompliance.  4. AST and ALT are normal. U/A is unremarkable. UTox negative. EtOH < 10 with no clinical evidence for withdrawal.   Recommendations: 1. MRI brain with MRA head 2. TSH, RPR, ESR, ammonia, B12 level 3. EEG in AM.  Electronically signed: Dr. Kerney Elbe 10/03/2018, 8:10 PM

## 2018-10-03 NOTE — H&P (Signed)
History and Physical    Justin [name] YQM:[id number] DOB: [birth year] DOA: 10/03/2018  PCP: Chesley Noon, MD Patient coming from: Home  Chief Complaint: Slurred speech  HPI: Justin [name] is a 78 y.o. male with medical history significant of brain bleed, prostate cancer, type 2 diabetes, hypertension, hyperlipidemia, CVA in 2019 presenting to the hospital via EMS for evaluation of slurred speech which started today.  Last known well at 0800.  Per EMS, no other neuro deficits.  Patient was AAO x4.  Patient is oriented to person place only.  Appears slightly confused and slow to respond to questions.  States last night he started having "thick speech" and his wife thinks it has been getting worse.  Denies any focal weakness/numbness, headaches, or blurry vision.  He is not sure if he has had a stroke before.  He is not sure if he takes aspirin at home.  He has no other complaints.  Denies any fevers, chills, chest pain, shortness of breath, nausea, vomiting, abdominal pain, or diarrhea.  No family available at this time.    Per ED provider conversation with patient's wife: Around 4 AM his speech sounded thick to her.  He was having a hard time answering certain questions.  She did not identify any weakness.  He did not have a problem with his gait or using his extremities.  She reported that this is not normal for him.  Typically patient's cognitive function and speech are without deficits.  Wife reported that patient has been noncompliant with his medications for probably up to 3 weeks.  He has not been taking his Plavix or other medications.  ED Course: Afebrile and hemodynamically stable.  CBC unremarkable.  CMP without significant abnormalities.  INR 1.1.  Blood ethanol level negative.  UDS negative.  UA negative for infection.  Head CT showing no acute intracranial abnormality.  Small lacunar infarct in the right thalamus, new since the prior study but thought to be chronic.  Old bilateral  basal ganglia infarcts.  Stable atrophy and chronic microvascular ischemic changes. Neurology was consulted and did not feel that the symptoms were secondary to acute stroke.  Felt patient symptoms may be the sequelae of old stroke and cerebral atrophy.  Neurology recommended admission for evaluation of AMS and getting MRI to rule out stroke or other etiology.  Review of Systems:  All systems reviewed and apart from history of presenting illness, are negative.  Past Medical History:  Diagnosis Date   Brain bleed ([place])    Cancer ([place])    prostate   Diabetes mellitus without complication ([place])    High cholesterol    Hypertension    Stroke [facility])     Past Surgical History:  Procedure Laterality Date   arm & hand surgery Left 1972   skill saw accident   COLON SURGERY     partial   EYE SURGERY     lens implant and cateract- Left   PROSTATECTOMY       reports that he has never smoked. He has never used smokeless tobacco. He reports that he does not drink alcohol or use drugs.  No Known Allergies  Family History  Problem Relation Age of Onset   CAD Mother    Diabetes Mother    Emphysema Father    Diabetes Sister    Diabetes Brother    Diabetes Sister    Diabetes Brother    Diabetes Mellitus II Neg Hx     Prior to  Admission medications   Medication Sig Start Date End Date Taking? Authorizing Provider  atorvastatin (LIPITOR) 80 MG tablet Take 1 tablet (80 mg total) by mouth daily. 10/20/17   Claris Gower, NP  Cholecalciferol (VITAMIN D-1000 MAX ST) 1000 units tablet Take 1,000 Units by mouth daily.    [provider]  citalopram (CELEXA) 10 MG tablet TAKE 1 TABLET BY MOUTH EVERY DAY 01/30/18   Claris Gower, NP  clopidogrel (PLAVIX) 75 MG tablet Take 1 tablet (75 mg total) by mouth daily. 10/20/17   Claris Gower, NP  cyanocobalamin ([employer] VITAMIN B12) 1000 MCG tablet Take 1,000 mcg by mouth daily.     [provider]  fenofibrate 160 MG  tablet Take 160 mg by mouth daily. 10/08/16 04/06/18  [provider]  furosemide (LASIX) 20 MG tablet Take by mouth. 10/04/16 12/29/18  [provider]  glipiZIDE (GLUCOTROL XL) 10 MG 24 hr tablet Take 10 mg by mouth daily with breakfast.    [provider]  hydrochlorothiazide (HYDRODIURIL) 25 MG tablet Take 25 mg by mouth daily.     [provider]  losartan (COZAAR) 100 MG tablet Take 100 mg by mouth daily.    [provider]  metFORMIN (GLUCOPHAGE-XR) 500 MG 24 hr tablet Take 1,000 mg by mouth 2 (two) times daily.     [provider]  polyvinyl alcohol (LIQUIFILM TEARS) 1.4 % ophthalmic solution Place 1 drop into both eyes 4 (four) times daily as needed for dry eyes.    [provider]  Semaglutide (OZEMPIC) 0.25 or 0.5 MG/DOSE SOPN Inject 0.25 mg into the skin once a week.    [provider]    Physical Exam: Vitals:   10/04/18 0130 10/04/18 0145 10/04/18 0200 10/04/18 0215  BP: 137/72 (!) 106/58 (!) 129/52 134/62  Pulse: (!) 55 (!) 57 (!) 57 (!) 58  Resp:      Temp:      TempSrc:      SpO2: 96% 98% 97% 97%  Weight:      Height:        Physical Exam  Constitutional: He appears well-developed and well-nourished. No distress.  HENT:  Head: Normocephalic.  Mouth/Throat: Oropharynx is clear and moist.  Eyes: Pupils are equal, round, and reactive to light. EOM are normal.  Neck: Neck supple.  Cardiovascular: Normal rate, regular rhythm and intact distal pulses.  Pulmonary/Chest: Effort normal and breath sounds normal. No respiratory distress. He has no wheezes. He has no rales.  Abdominal: Soft. Bowel sounds are normal. He exhibits no distension. There is no abdominal tenderness. There is no guarding.  Musculoskeletal: Normal range of motion.  Neurological: Coordination normal.  Awake and alert Oriented to person place only Slow to respond to questions Appears slightly confused Speech slurred No facial  droop Strength 5 out of 5 in bilateral upper and lower extremities. Sensation to light touch intact throughout.  Skin: Skin is warm and dry. He is not diaphoretic.     Labs on Admission: I have personally reviewed following labs and imaging studies  CBC: Recent Labs  Lab 10/03/18 1623 10/03/18 1635  WBC 9.1  --   NEUTROABS 5.5  --   HGB 15.7 15.6  HCT 46.2 46.0  MCV 83.1  --   PLT 285  --    Basic Metabolic Panel: Recent Labs  Lab 10/03/18 1623 10/03/18 1635  NA 138 137  K 3.8 3.7  CL 102 100  CO2 24  --   GLUCOSE  90 85  BUN 17 18  CREATININE 1.13 1.00  CALCIUM 9.8  --    GFR: Estimated Creatinine Clearance: 78.4 mL/min (by C-G formula based on SCr of 1 mg/dL). Liver Function Tests: Recent Labs  Lab 10/03/18 1623  AST 18  ALT 16  ALKPHOS 32*  BILITOT 1.1  PROT 6.6  ALBUMIN 3.9   No results for input(s): LIPASE, AMYLASE in the last 168 hours. No results for input(s): AMMONIA in the last 168 hours. Coagulation Profile: Recent Labs  Lab 10/03/18 1623  INR 1.1   Cardiac Enzymes: No results for input(s): CKTOTAL, CKMB, CKMBINDEX, TROPONINI in the last 168 hours. BNP (last 3 results) No results for input(s): PROBNP in the last 8760 hours. HbA1C: No results for input(s): HGBA1C in the last 72 hours. CBG: Recent Labs  Lab 10/03/18 2141 10/03/18 2314  GLUCAP 77 73   Lipid Profile: No results for input(s): CHOL, HDL, LDLCALC, TRIG, CHOLHDL, LDLDIRECT in the last 72 hours. Thyroid Function Tests: No results for input(s): TSH, T4TOTAL, FREET4, T3FREE, THYROIDAB in the last 72 hours. Anemia Panel: No results for input(s): VITAMINB12, FOLATE, FERRITIN, TIBC, IRON, RETICCTPCT in the last 72 hours. Urine analysis:    Component Value Date/Time   COLORURINE YELLOW 10/03/2018 1800   APPEARANCEUR CLEAR 10/03/2018 1800   LABSPEC 1.015 10/03/2018 1800   PHURINE 7.0 10/03/2018 1800   GLUCOSEU NEGATIVE 10/03/2018 1800   HGBUR NEGATIVE 10/03/2018 1800    BILIRUBINUR NEGATIVE 10/03/2018 1800   KETONESUR NEGATIVE 10/03/2018 1800   PROTEINUR NEGATIVE 10/03/2018 1800   UROBILINOGEN 4.0 (H) 06/18/2009 1928   NITRITE NEGATIVE 10/03/2018 1800   LEUKOCYTESUR NEGATIVE 10/03/2018 1800    Radiological Exams on Admission: Ct Head Wo Contrast  Result Date: 10/03/2018 CLINICAL DATA:  Aphasia. EXAM: CT HEAD WITHOUT CONTRAST TECHNIQUE: Contiguous axial images were obtained from the base of the skull through the vertex without intravenous contrast. COMPARISON:  CT head dated December 07, 2017. FINDINGS: Brain: No evidence of acute infarction, hemorrhage, hydrocephalus, extra-axial collection or mass lesion/mass effect. Stable atrophy and chronic microvascular ischemic changes. Old bilateral basal ganglia infarcts. New small lacunar infarct in the right thalamus, likely chronic. Vascular: Atherosclerotic vascular calcification of the carotid siphons. No hyperdense vessel or unexpected calcification. Skull: Normal. Negative for fracture or focal lesion. Sinuses/Orbits: No acute finding. Other: None. IMPRESSION: 1.  No acute intracranial abnormality. 2. Small lacunar infarct in the right thalamus, new since the prior study, but likely chronic. 3. Old bilateral basal ganglia infarcts. 4. Stable atrophy and chronic microvascular ischemic changes. Electronically Signed   By: Titus Dubin M.D.   On: 10/03/2018 19:09   Mr Angio Head Wo Contrast  Result Date: 10/04/2018 CLINICAL DATA:  78 year old male with small acute white matter infarct in the left corona radiata on brain MRI tonight. EXAM: MRA HEAD WITHOUT CONTRAST MRA NECK WITHOUT AND WITH CONTRAST TECHNIQUE: Multiplanar, multiecho pulse sequences of the brain and surrounding structures were obtained without and with intravenous contrast. Angiographic images of the Circle of Willis were obtained using MRA technique without intravenous contrast. Angiographic images of the neck were obtained using MRA technique without  and with intravenous contrast. Carotid stenosis measurements (when applicable) are obtained utilizing NASCET criteria, using the distal internal carotid diameter as the denominator. CONTRAST:  10 milliliters Gadavist COMPARISON:  Brain MRI earlier tonight. FINDINGS: MRA NECK FINDINGS Precontrast time-of-flight imaging reveals antegrade flow in both cervical carotid and vertebral arteries. The left vertebral artery is dominant. There is a partially retropharyngeal course  of the left carotid. Antegrade flow continues to the skull base. Post-contrast neck MRA images reveal a 3 vessel arch configuration. Normal great vessel origins. Tortuous proximal right CCA. Mild irregularity and stenosis at the right ICA origin which does not appear hemodynamically significant. Otherwise negative cervical right ICA. Tortuous proximal left CCA. Mild irregularity at the left ICA origin without stenosis. Otherwise negative cervical left ICA. Tortuous proximal subclavian arteries more so the right. Non dominant right vertebral artery origin and cervical right vertebral arteries are within normal limits. Dominant left vertebral artery origin is not well visualized. The left vertebral artery is otherwise patent to the skull base without stenosis. Negative visible postcontrast ICA siphons and basilar artery. The non dominant right vertebral appears to functionally terminates in PICA. MRA HEAD FINDINGS Antegrade flow in the posterior circulation with dominant left vertebral artery supplying the basilar. The non dominant right vertebral is diminutive beyond the PICA. The left AICA appears dominant. No distal left vertebral or basilar stenosis, there is mild basilar irregularity. The vertebrobasilar junction is mildly fenestrated (normal variant). Patent AICA and SCA origins. Fetal type left PCA origin. There is mild to moderate right P1 irregularity and stenosis beyond the right PCA origin (series 1050, image 11). There is mild to moderate  bilateral PCA branch irregularity and stenosis with preserved distal flow signal. Antegrade flow in both ICA siphons. No siphon stenosis. Normal ophthalmic and left posterior communicating artery origins. Patent carotid termini. Patent MCA and ACA origins. The left A1 is mildly dominant. There is mild irregularity and stenosis at the left MCA origin. The right MCA origin is normal. Anterior communicating artery is normal. Mild right and mild-to-moderate left ACA A2 irregularity and stenosis (series 1046, image 7). The right M1 bifurcates early and is normal. There is mild right MCA branch irregularity. The left MCA also bifurcates early, and there is no proximal MCA stenosis but there is a severe stenosis of a left M2 branch best demonstrated on series 1046, image 5). IMPRESSION: 1. Neck MRA reveals mild extracranial atherosclerosis with no significant cervical carotid or vertebral artery stenosis. 2. Head MRA reveals moderate intracranial atherosclerosis. - up to moderate stenosis of the right PCA P1, both ACA A2 segments. - severe stenosis of a left MCA M2 branch. Electronically Signed   By: Genevie Ann M.D.   On: 10/04/2018 03:36   Mr Angio Neck W Wo Contrast  Result Date: 10/04/2018 CLINICAL DATA:  78 year old male with small acute white matter infarct in the left corona radiata on brain MRI tonight. EXAM: MRA HEAD WITHOUT CONTRAST MRA NECK WITHOUT AND WITH CONTRAST TECHNIQUE: Multiplanar, multiecho pulse sequences of the brain and surrounding structures were obtained without and with intravenous contrast. Angiographic images of the Circle of Willis were obtained using MRA technique without intravenous contrast. Angiographic images of the neck were obtained using MRA technique without and with intravenous contrast. Carotid stenosis measurements (when applicable) are obtained utilizing NASCET criteria, using the distal internal carotid diameter as the denominator. CONTRAST:  10 milliliters Gadavist COMPARISON:   Brain MRI earlier tonight. FINDINGS: MRA NECK FINDINGS Precontrast time-of-flight imaging reveals antegrade flow in both cervical carotid and vertebral arteries. The left vertebral artery is dominant. There is a partially retropharyngeal course of the left carotid. Antegrade flow continues to the skull base. Post-contrast neck MRA images reveal a 3 vessel arch configuration. Normal great vessel origins. Tortuous proximal right CCA. Mild irregularity and stenosis at the right ICA origin which does not appear hemodynamically significant. Otherwise negative  cervical right ICA. Tortuous proximal left CCA. Mild irregularity at the left ICA origin without stenosis. Otherwise negative cervical left ICA. Tortuous proximal subclavian arteries more so the right. Non dominant right vertebral artery origin and cervical right vertebral arteries are within normal limits. Dominant left vertebral artery origin is not well visualized. The left vertebral artery is otherwise patent to the skull base without stenosis. Negative visible postcontrast ICA siphons and basilar artery. The non dominant right vertebral appears to functionally terminates in PICA. MRA HEAD FINDINGS Antegrade flow in the posterior circulation with dominant left vertebral artery supplying the basilar. The non dominant right vertebral is diminutive beyond the PICA. The left AICA appears dominant. No distal left vertebral or basilar stenosis, there is mild basilar irregularity. The vertebrobasilar junction is mildly fenestrated (normal variant). Patent AICA and SCA origins. Fetal type left PCA origin. There is mild to moderate right P1 irregularity and stenosis beyond the right PCA origin (series 1050, image 11). There is mild to moderate bilateral PCA branch irregularity and stenosis with preserved distal flow signal. Antegrade flow in both ICA siphons. No siphon stenosis. Normal ophthalmic and left posterior communicating artery origins. Patent carotid termini.  Patent MCA and ACA origins. The left A1 is mildly dominant. There is mild irregularity and stenosis at the left MCA origin. The right MCA origin is normal. Anterior communicating artery is normal. Mild right and mild-to-moderate left ACA A2 irregularity and stenosis (series 1046, image 7). The right M1 bifurcates early and is normal. There is mild right MCA branch irregularity. The left MCA also bifurcates early, and there is no proximal MCA stenosis but there is a severe stenosis of a left M2 branch best demonstrated on series 1046, image 5). IMPRESSION: 1. Neck MRA reveals mild extracranial atherosclerosis with no significant cervical carotid or vertebral artery stenosis. 2. Head MRA reveals moderate intracranial atherosclerosis. - up to moderate stenosis of the right PCA P1, both ACA A2 segments. - severe stenosis of a left MCA M2 branch. Electronically Signed   By: Genevie Ann M.D.   On: 10/04/2018 03:36   Mr Brain Wo Contrast (neuro Protocol)  Result Date: 10/04/2018 CLINICAL DATA:  78 year old male with altered mental status, aphasia. Age indeterminate right thalamic lacune on head CT earlier today. EXAM: MRI HEAD WITHOUT CONTRAST TECHNIQUE: Multiplanar, multiecho pulse sequences of the brain and surrounding structures were obtained without intravenous contrast. COMPARISON:  Head CT 10/03/2018. Brain MRI and intracranial MRA 09/18/2017. FINDINGS: Brain: Small 6-7 millimeter focus of restricted diffusion in the left corona radiata on series 5, image 80. Background widespread cerebral white matter T2 and FLAIR hyperintensity. No associated hemorrhage or mass effect. No other No restricted diffusion or evidence of acute infarction. Expected evolution of the 2019 left basal ganglia infarct now with encephalomalacia. Chronic right basal ganglia and right thalamic lacunar infarcts. Bilateral cerebral white matter T2 and FLAIR hyperintensity has not significantly changed. Comparatively mild T2 heterogeneity in the  pons is stable. No midline shift, mass effect, evidence of mass lesion, ventriculomegaly, extra-axial collection or acute intracranial hemorrhage. Cervicomedullary junction and pituitary are within normal limits. 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 Mild hemosiderin associated with the left basal ganglia infarct. Vascular: Major intracranial vascular flow voids stable since 2019. Dominant left vertebral artery. Skull and upper cervical spine: Negative visible cervical spine. Visualized bone marrow signal is within normal limits. Sinuses/Orbits: Stable, negative. Other: Mastoids remain clear. Visible internal auditory structures appear normal.  Scalp and face soft tissues appear stable. IMPRESSION: 1. Small acute white matter infarct in the left corona radiata. No associated hemorrhage or mass effect. 2. Underlying advanced chronic small vessel disease with expected evolution of the 2019 left basal ganglia infarct. Electronically Signed   By: Genevie Ann M.D.   On: 10/04/2018 00:21    EKG: Independently reviewed.  Sinus rhythm, RBBB.  No significant change since prior tracing.  Assessment/Plan Principal Problem:   Acute CVA (cerebrovascular accident) ([place]) Active Problems:   HLD (hyperlipidemia)   Essential hypertension   Type 2 diabetes mellitus ([place])   Depression  Acute CVA Patient is here for evaluation of dysarthria and altered mental status.  Head CT negative for acute finding.  Outside TPA window.  MRI showing small acute white matter infarct in the left corona radiata.  No associated hemorrhage or mass-effect.  He has been noncompliant with his home medications including Plavix for the past 3 weeks. -Neurology following -Telemetry monitoring -Allow for permissive hypertension-treat only if systolic blood pressures are greater than 220. -MRA head and neck pending -2D echocardiogram -Hemoglobin A1c, fasting lipid panel -Resume Plavix  75 mg daily -Presumed home Lipitor 80 mg daily -Frequent neurochecks -PT, OT, speech therapy -N.p.o. until cleared by bedside swallow evaluation or formal speech evaluation  Non-insulin-dependent diabetes mellitus -Check A1c.  Sliding scale insulin and CBG checks.  Hypertension -Currently normotensive.  Allow permissive hypertension in the setting of acute CVA.  Hyperlipidemia -Continue statin  Depression -Continue home Celexa  DVT prophylaxis: Lovenox Code Status: Patient wishes to be full code Family Communication: No family available at this time. Disposition Plan: Anticipate discharge after clinical improvement. Consults called: Neurology (Dr. Cheral Marker) Admission status: It is my clinical opinion that admission to INPATIENT is reasonable and necessary in this 78 y.o. male  presenting with dysarthria and altered mental status concerning for acute CVA.  MRI showing small acute white matter infarct in the left corona radiata.  Will need stroke work-up.  Given the aforementioned, the predictability of an adverse outcome is felt to be significant. I expect that the patient will require at least 2 midnights in the hospital to treat this condition.   The medical decision making on this patient was of high complexity and the patient is at high risk for clinical deterioration, therefore this is a level 3 visit.  Shela Leff MD [facility] Pager [phone]  If 7PM-7AM, please contact night-coverage [email or web address] Password [email or web address]  10/04/2018, 3:43 AM

## 2018-10-04 ENCOUNTER — Encounter (HOSPITAL_COMMUNITY): Payer: Self-pay | Admitting: General Practice

## 2018-10-04 ENCOUNTER — Observation Stay (HOSPITAL_COMMUNITY): Payer: Medicare HMO

## 2018-10-04 ENCOUNTER — Telehealth: Payer: Self-pay | Admitting: Adult Health

## 2018-10-04 ENCOUNTER — Inpatient Hospital Stay (HOSPITAL_COMMUNITY): Payer: Medicare HMO

## 2018-10-04 DIAGNOSIS — E1159 Type 2 diabetes mellitus with other circulatory complications: Secondary | ICD-10-CM | POA: Diagnosis not present

## 2018-10-04 DIAGNOSIS — Y929 Unspecified place or not applicable: Secondary | ICD-10-CM | POA: Diagnosis not present

## 2018-10-04 DIAGNOSIS — I639 Cerebral infarction, unspecified: Secondary | ICD-10-CM | POA: Diagnosis not present

## 2018-10-04 DIAGNOSIS — R471 Dysarthria and anarthria: Secondary | ICD-10-CM | POA: Diagnosis present

## 2018-10-04 DIAGNOSIS — Z8249 Family history of ischemic heart disease and other diseases of the circulatory system: Secondary | ICD-10-CM | POA: Diagnosis not present

## 2018-10-04 DIAGNOSIS — I6389 Other cerebral infarction: Secondary | ICD-10-CM | POA: Diagnosis present

## 2018-10-04 DIAGNOSIS — G9389 Other specified disorders of brain: Secondary | ICD-10-CM | POA: Diagnosis present

## 2018-10-04 DIAGNOSIS — E119 Type 2 diabetes mellitus without complications: Secondary | ICD-10-CM

## 2018-10-04 DIAGNOSIS — E785 Hyperlipidemia, unspecified: Secondary | ICD-10-CM | POA: Diagnosis present

## 2018-10-04 DIAGNOSIS — Z961 Presence of intraocular lens: Secondary | ICD-10-CM | POA: Diagnosis present

## 2018-10-04 DIAGNOSIS — I4510 Unspecified right bundle-branch block: Secondary | ICD-10-CM | POA: Diagnosis present

## 2018-10-04 DIAGNOSIS — Z8673 Personal history of transient ischemic attack (TIA), and cerebral infarction without residual deficits: Secondary | ICD-10-CM

## 2018-10-04 DIAGNOSIS — Z9842 Cataract extraction status, left eye: Secondary | ICD-10-CM | POA: Diagnosis not present

## 2018-10-04 DIAGNOSIS — Z7902 Long term (current) use of antithrombotics/antiplatelets: Secondary | ICD-10-CM | POA: Diagnosis not present

## 2018-10-04 DIAGNOSIS — I10 Essential (primary) hypertension: Secondary | ICD-10-CM | POA: Diagnosis present

## 2018-10-04 DIAGNOSIS — Z20828 Contact with and (suspected) exposure to other viral communicable diseases: Secondary | ICD-10-CM | POA: Diagnosis present

## 2018-10-04 DIAGNOSIS — Z833 Family history of diabetes mellitus: Secondary | ICD-10-CM | POA: Diagnosis not present

## 2018-10-04 DIAGNOSIS — Z79899 Other long term (current) drug therapy: Secondary | ICD-10-CM | POA: Diagnosis not present

## 2018-10-04 DIAGNOSIS — F329 Major depressive disorder, single episode, unspecified: Secondary | ICD-10-CM

## 2018-10-04 DIAGNOSIS — I77810 Thoracic aortic ectasia: Secondary | ICD-10-CM | POA: Diagnosis present

## 2018-10-04 DIAGNOSIS — E7800 Pure hypercholesterolemia, unspecified: Secondary | ICD-10-CM | POA: Diagnosis present

## 2018-10-04 DIAGNOSIS — I672 Cerebral atherosclerosis: Secondary | ICD-10-CM | POA: Diagnosis present

## 2018-10-04 DIAGNOSIS — T45526A Underdosing of antithrombotic drugs, initial encounter: Secondary | ICD-10-CM | POA: Diagnosis present

## 2018-10-04 DIAGNOSIS — E1151 Type 2 diabetes mellitus with diabetic peripheral angiopathy without gangrene: Secondary | ICD-10-CM | POA: Diagnosis present

## 2018-10-04 DIAGNOSIS — F32A Depression, unspecified: Secondary | ICD-10-CM

## 2018-10-04 DIAGNOSIS — Z8546 Personal history of malignant neoplasm of prostate: Secondary | ICD-10-CM | POA: Diagnosis not present

## 2018-10-04 DIAGNOSIS — Z825 Family history of asthma and other chronic lower respiratory diseases: Secondary | ICD-10-CM | POA: Diagnosis not present

## 2018-10-04 DIAGNOSIS — G4733 Obstructive sleep apnea (adult) (pediatric): Secondary | ICD-10-CM | POA: Diagnosis present

## 2018-10-04 DIAGNOSIS — Z7984 Long term (current) use of oral hypoglycemic drugs: Secondary | ICD-10-CM | POA: Diagnosis not present

## 2018-10-04 LAB — CBC WITH DIFFERENTIAL/PLATELET
Abs Immature Granulocytes: 0.03 10*3/uL (ref 0.00–0.07)
Basophils Absolute: 0.1 10*3/uL (ref 0.0–0.1)
Basophils Relative: 1 %
Eosinophils Absolute: 0.3 10*3/uL (ref 0.0–0.5)
Eosinophils Relative: 4 %
HCT: 44.4 % (ref 39.0–52.0)
Hemoglobin: 14.9 g/dL (ref 13.0–17.0)
Immature Granulocytes: 0 %
Lymphocytes Relative: 29 %
Lymphs Abs: 2.1 10*3/uL (ref 0.7–4.0)
MCH: 28 pg (ref 26.0–34.0)
MCHC: 33.6 g/dL (ref 30.0–36.0)
MCV: 83.5 fL (ref 80.0–100.0)
Monocytes Absolute: 0.6 10*3/uL (ref 0.1–1.0)
Monocytes Relative: 8 %
Neutro Abs: 4.1 10*3/uL (ref 1.7–7.7)
Neutrophils Relative %: 58 %
Platelets: 245 10*3/uL (ref 150–400)
RBC: 5.32 MIL/uL (ref 4.22–5.81)
RDW: 12.1 % (ref 11.5–15.5)
WBC: 7.2 10*3/uL (ref 4.0–10.5)
nRBC: 0 % (ref 0.0–0.2)

## 2018-10-04 LAB — LIPID PANEL
Cholesterol: 108 mg/dL (ref 0–200)
HDL: 30 mg/dL — ABNORMAL LOW (ref >40)
LDL Cholesterol: 48 mg/dL (ref 0–99)
Total CHOL/HDL Ratio: 3.6 RATIO
Triglycerides: 149 mg/dL (ref <150)
VLDL: 30 mg/dL (ref 0–40)

## 2018-10-04 LAB — COMPREHENSIVE METABOLIC PANEL
ALT: 15 U/L (ref 0–44)
AST: 18 U/L (ref 15–41)
Albumin: 3.5 g/dL (ref 3.5–5.0)
Alkaline Phosphatase: 29 U/L — ABNORMAL LOW (ref 38–126)
Anion gap: 10 (ref 5–15)
BUN: 16 mg/dL (ref 8–23)
CO2: 27 mmol/L (ref 22–32)
Calcium: 8.8 mg/dL — ABNORMAL LOW (ref 8.9–10.3)
Chloride: 98 mmol/L (ref 98–111)
Creatinine, Ser: 1.02 mg/dL (ref 0.61–1.24)
GFR calc Af Amer: >60 mL/min (ref >60)
GFR calc non Af Amer: >60 mL/min (ref >60)
Glucose, Bld: 196 mg/dL — ABNORMAL HIGH (ref 70–99)
Potassium: 3.7 mmol/L (ref 3.5–5.1)
Sodium: 135 mmol/L (ref 135–145)
Total Bilirubin: 1.1 mg/dL (ref 0.3–1.2)
Total Protein: 5.8 g/dL — ABNORMAL LOW (ref 6.5–8.1)

## 2018-10-04 LAB — MAGNESIUM: Magnesium: 1.8 mg/dL (ref 1.7–2.4)

## 2018-10-04 LAB — PHOSPHORUS: Phosphorus: 2.6 mg/dL (ref 2.5–4.6)

## 2018-10-04 LAB — ECHOCARDIOGRAM COMPLETE
Height: 76 in
Weight: 3488 oz

## 2018-10-04 LAB — GLUCOSE, CAPILLARY
Glucose-Capillary: 139 mg/dL — ABNORMAL HIGH (ref 70–99)
Glucose-Capillary: 125 mg/dL — ABNORMAL HIGH (ref 70–99)
Glucose-Capillary: 140 mg/dL — ABNORMAL HIGH (ref 70–99)

## 2018-10-04 LAB — HEMOGLOBIN A1C
Hgb A1c MFr Bld: 6.9 % — ABNORMAL HIGH (ref 4.8–5.6)
Mean Plasma Glucose: 151.33 mg/dL

## 2018-10-04 MED ORDER — VITAMIN B-12 1000 MCG PO TABS
1000.0000 ug | ORAL_TABLET | Freq: Every day | ORAL | Status: DC
  Administered 2018-10-04: 1000 ug via ORAL
  Filled 2018-10-04: qty 1

## 2018-10-04 MED ORDER — ATORVASTATIN CALCIUM 80 MG PO TABS
80.0000 mg | ORAL_TABLET | Freq: Every day | ORAL | Status: DC
  Administered 2018-10-04: 80 mg via ORAL
  Filled 2018-10-04: qty 1

## 2018-10-04 MED ORDER — ASPIRIN 325 MG PO TABS: 325.0000 mg | ORAL_TABLET | Freq: Every day | ORAL | Status: DC

## 2018-10-04 MED ORDER — CLOPIDOGREL BISULFATE 75 MG PO TABS
75.0000 mg | ORAL_TABLET | Freq: Every day | ORAL | Status: DC
  Administered 2018-10-04: 75 mg via ORAL
  Filled 2018-10-04: qty 1

## 2018-10-04 MED ORDER — VITAMIN D 25 MCG (1000 UNIT) PO TABS
1000.0000 [IU] | ORAL_TABLET | Freq: Every day | ORAL | Status: DC
  Administered 2018-10-04: 1000 [IU] via ORAL
  Filled 2018-10-04: qty 1

## 2018-10-04 MED ORDER — GADOBUTROL 1 MMOL/ML IV SOLN
10.0000 mL | Freq: Once | INTRAVENOUS | Status: AC | PRN
  Administered 2018-10-04: 10 mL via INTRAVENOUS

## 2018-10-04 MED ORDER — CITALOPRAM HYDROBROMIDE 10 MG PO TABS
10.0000 mg | ORAL_TABLET | Freq: Every day | ORAL | Status: DC
  Administered 2018-10-04: 10 mg via ORAL
  Filled 2018-10-04: qty 1

## 2018-10-04 MED ORDER — ATORVASTATIN CALCIUM 40 MG PO TABS: 40.0000 mg | ORAL_TABLET | Freq: Every day | ORAL | Status: DC

## 2018-10-04 MED ORDER — ASPIRIN EC 325 MG PO TBEC
325.0000 mg | DELAYED_RELEASE_TABLET | Freq: Every day | ORAL | Status: DC
  Administered 2018-10-04: 325 mg via ORAL
  Filled 2018-10-04: qty 1

## 2018-10-04 MED ORDER — FENOFIBRATE 160 MG PO TABS
160.0000 mg | ORAL_TABLET | Freq: Every day | ORAL | Status: DC
  Administered 2018-10-04: 160 mg via ORAL
  Filled 2018-10-04: qty 1

## 2018-10-04 MED ORDER — ASPIRIN 325 MG PO TBEC: 325.0000 mg | DELAYED_RELEASE_TABLET | Freq: Every day | ORAL | 0 refills | Status: DC

## 2018-10-04 NOTE — Discharge Summary (Signed)
Physician Discharge Summary  Justin [name]:[id number] DOB: 1940-03-28 DOA: 10/03/2018  PCP: Chesley Noon, MD  Admit date: 10/03/2018 Discharge date: 10/04/2018  Admitted From: Home Disposition: Home with [place] PT/OT/SLP  Recommendations for Outpatient Follow-up:  1. Follow up with PCP in 1-2 weeks 2. Follow-up with neurology within 4 weeks 3. Please obtain CMP/CBC, Mag, Phos in one week 4. 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 Twanna Hy on 10/03/2018  Justin [name] is a 78 y.o. male with medical history significant of brain bleed, prostate cancer, type 2 diabetes, hypertension, hyperlipidemia, CVA in 2019 presenting to the hospital via EMS for evaluation of slurred speech which started today.  Last known well at 0800.  Per EMS, no other neuro deficits.  Patient was AAO x4.  Patient is oriented to person place only.  Appears slightly confused and slow to respond to questions.  States last night he started having "thick speech" and his wife thinks it has been getting worse.  Denies any focal weakness/numbness, headaches, or blurry vision.  He is not sure if he has had a stroke before.  He is not sure if he takes aspirin at home.  He has no other complaints.  Denies any fevers, chills, chest pain, shortness of breath, nausea, vomiting, abdominal pain, or diarrhea.  No family available at this time.    Per ED provider conversation with patient's wife: Around 60 AM his speech sounded thick to her.  He was having a hard time answering certain questions.  She did not identify any weakness.  He did not have a problem with his gait or using his extremities.  She reported that this is not normal for him.  Typically patient's cognitive function and speech are without  deficits.  Wife reported that patient has been noncompliant with his medications for probably up to 3 weeks.  He has not been taking his Plavix or other medications.  ED Course: Afebrile and hemodynamically stable.  CBC unremarkable.  CMP without significant abnormalities.  INR 1.1.  Blood ethanol level negative.  UDS negative.  UA negative for infection.  Head CT showing no acute intracranial abnormality.  Small lacunar infarct in the right thalamus, new since the prior study but thought to be chronic.  Old bilateral basal ganglia infarcts.  Stable atrophy and chronic microvascular ischemic changes. Neurology was consulted and did not feel that the symptoms were secondary to acute stroke.  Felt patient symptoms may be the sequelae of old stroke and cerebral atrophy.  Neurology recommended admission for evaluation of AMS and getting MRI to rule out stroke or other etiology.  **Interim History  Patient was worked up for a CVA and improved quicker than expected.  Neurology evaluated and placed the patient on aspirin 325 mg p.o. daily along with Plavix 75 mg p.o. daily.  Patient will continue a statin at discharge.  PT OT evaluated as well as SLP and they recommend home health therapies.  Patient was deemed stable for discharge after his complete work-up for CVA.  He will need to follow-up with PCP within 1 week and with neurology within 4 weeks.  Discharge Diagnoses:  Principal Problem:   Acute CVA (cerebrovascular accident) ([place]) Active Problems:   HLD (hyperlipidemia)   Essential hypertension   Type 2  diabetes mellitus ([place])   Depression  Acute CVA -Patient is here for evaluation of dysarthriaandaltered mental status. -Head CT negative for acute finding. Outside TPA window.  -MRI showing small acute white matter infarct in the left corona radiata. No associated hemorrhage or mass-effect.  -He has been noncompliant with his home medications including Plavix for the past 3  weeks. -Neurology following recommending aspirin 325 mg along with Plavix for 3 months and then just Plavix alone -Continued telemetry monitoring -Allow for permissive hypertension-treat only if systolic blood pressures are greater than 220. -MRA head and neck showed "Neck MRA reveals mild extracranial atherosclerosis with no significant cervical carotid or vertebral artery stenosis. Head MRA reveals moderate intracranial atherosclerosis. Up to moderate stenosis of the right PCA P1, both ACA A2 segments. 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  Will need to follow-up with neurology in outpatient setting within 4 weeks as patient is stable for discharge.    Non-insulin-dependent diabetes mellitus -Checked A1c and was 6.9. Sliding scale insulin and CBG checks while hospitalized. -CBGs range from 125-140 today   Hypertension -Currently normotensive. Allow permissive hypertension in the setting of acute CVA. -Resume home blood pressure medications as an outpatient slowly and have discontinued hydrochlorothiazide altogether discharge  Hyperlipidemia -Patient's lipid panel done and showed a total cholesterol/HDL ratio of 3.6, cholesterol level of 108, HDL 30, LDL of 48, triglycerides 149, VLDL 30 -Continue statin and fenofibrate at discharge  Depression -Continue home Celexa  OSA -Continue with home CPAP  Discharge Instructions  Discharge Instructions    Ambulatory referral to Neurology   Complete by: As directed    Follow up with stroke clinic NP (Jessica Vanschaick or Cecille Rubin, if both not available,  consider Zachery Dauer, or [facility]) at [facility] in about 4 weeks. Thanks.   Diet - low sodium heart healthy   Complete by: As directed    Increase activity slowly   Complete by: As directed      Allergies as of 10/04/2018   No Known Allergies     Medication List    STOP taking these medications   hydrochlorothiazide 25 MG tablet Commonly known as: HYDRODIURIL   naproxen sodium 220 MG tablet Commonly known as: ALEVE     TAKE these medications   aspirin 325 MG EC tablet Take 1 tablet (325 mg total) by mouth daily. Start taking on: October 05, 2018   atorvastatin 80 MG tablet Commonly known as: LIPITOR Take 1 tablet (80 mg total) by mouth daily.   citalopram 10 MG tablet Commonly known as: CELEXA TAKE 1 TABLET BY MOUTH EVERY DAY   clopidogrel 75 MG tablet Commonly known as: PLAVIX Take 1 tablet (75 mg total) by mouth daily.   [employer] VITAMIN B12 1000 MCG tablet Generic drug: cyanocobalamin Take 1,000 mcg by mouth daily.   fenofibrate 160 MG tablet Take 160 mg by mouth daily.   Fish Oil 1000 MG Caps Take 1,000 mg by mouth daily with breakfast.   furosemide 20 MG tablet Commonly known as: LASIX Take 20 mg by mouth daily.   glipiZIDE 10 MG tablet Commonly known as: GLUCOTROL Take 10 mg by mouth  2 (two) times daily before a meal.   losartan 100 MG tablet Commonly known as: COZAAR Take 100 mg by mouth daily.   metFORMIN 500 MG tablet Commonly known as: GLUCOPHAGE Take 1,000 mg by mouth 2 (two) times daily with a meal.   mirabegron ER 50 MG Tb24 tablet Commonly known as: MYRBETRIQ Take 50 mg by mouth daily.   Ozempic (0.25 or 0.5 MG/DOSE) 2 MG/1.5ML Sopn Generic drug: Semaglutide(0.25 or 0.5MG /DOS) Inject 0.25 mg into the skin once a week.   polyvinyl alcohol 1.4 % ophthalmic solution Commonly known as: LIQUIFILM TEARS Place 1 drop into both eyes 4 (four) times daily as needed for dry eyes.   Vitamin D-1000 Max St 25 MCG (1000 UT) tablet Generic drug:  Cholecalciferol Take 1,000 Units by mouth daily.            Durable Medical Equipment  (From admission, onward)         Start     Ordered   10/04/18 1613  For home use only DME Walker rolling  [facility])  Once    Question Answer Comment  Patient needs a walker to treat with the following condition Generalized weakness   Patient needs a walker to treat with the following condition Stroke [facility])      10/04/18 1615         Follow-up Information    Claris Gower, NP. Schedule an appointment as soon as possible for a visit in 4 week(s).   Specialty: Neurology Contact information: [address] [place] [address] [phone]        Chesley Noon, MD. Call.   Specialty: Family Medicine Why: Follow up within 1-2 weeks  Contact information: [place] [address] [phone]          No Known Allergies  Consultations:  Neurology Dr. Erlinda Hong  Procedures/Studies: Ct Head Wo Contrast  Result Date: 10/03/2018 CLINICAL DATA:  Aphasia. EXAM: CT HEAD WITHOUT CONTRAST TECHNIQUE: Contiguous axial images were obtained from the base of the skull through the vertex without intravenous contrast. COMPARISON:  CT head dated December 07, 2017. FINDINGS: Brain: No evidence of acute infarction, hemorrhage, hydrocephalus, extra-axial collection or mass lesion/mass effect. Stable atrophy and chronic microvascular ischemic changes. Old bilateral basal ganglia infarcts. New small lacunar infarct in the right thalamus, likely chronic. Vascular: Atherosclerotic vascular calcification of the carotid siphons. No hyperdense vessel or unexpected calcification. Skull: Normal. Negative for fracture or focal lesion. Sinuses/Orbits: No acute finding. Other: None. IMPRESSION: 1.  No acute intracranial abnormality. 2. Small lacunar infarct in the right thalamus, new since the prior study, but likely chronic. 3. Old bilateral basal ganglia infarcts. 4. Stable atrophy and chronic  microvascular ischemic changes. Electronically Signed   By: Titus Dubin M.D.   On: 10/03/2018 19:09   Mr Angio Head Wo Contrast  Result Date: 10/04/2018 CLINICAL DATA:  78 year old male with small acute white matter infarct in the left corona radiata on brain MRI tonight. EXAM: MRA HEAD WITHOUT CONTRAST MRA NECK WITHOUT AND WITH CONTRAST TECHNIQUE: Multiplanar, multiecho pulse sequences of the brain and surrounding structures were obtained without and with intravenous contrast. Angiographic images of the Circle of Willis were obtained using MRA technique without intravenous contrast. Angiographic images of the neck were obtained using MRA technique without and with intravenous contrast. Carotid stenosis measurements (when applicable) are obtained utilizing NASCET criteria, using the distal internal carotid diameter as the denominator. CONTRAST:  10 milliliters Gadavist COMPARISON:  Brain MRI earlier  tonight. FINDINGS: MRA NECK FINDINGS Precontrast time-of-flight imaging reveals antegrade flow in both cervical carotid and vertebral arteries. The left vertebral artery is dominant. There is a partially retropharyngeal course of the left carotid. Antegrade flow continues to the skull base. Post-contrast neck MRA images reveal a 3 vessel arch configuration. Normal great vessel origins. Tortuous proximal right CCA. Mild irregularity and stenosis at the right ICA origin which does not appear hemodynamically significant. Otherwise negative cervical right ICA. Tortuous proximal left CCA. Mild irregularity at the left ICA origin without stenosis. Otherwise negative cervical left ICA. Tortuous proximal subclavian arteries more so the right. Non dominant right vertebral artery origin and cervical right vertebral arteries are within normal limits. Dominant left vertebral artery origin is not well visualized. The left vertebral artery is otherwise patent to the skull base without stenosis. Negative visible postcontrast  ICA siphons and basilar artery. The non dominant right vertebral appears to functionally terminates in PICA. MRA HEAD FINDINGS Antegrade flow in the posterior circulation with dominant left vertebral artery supplying the basilar. The non dominant right vertebral is diminutive beyond the PICA. The left AICA appears dominant. No distal left vertebral or basilar stenosis, there is mild basilar irregularity. The vertebrobasilar junction is mildly fenestrated (normal variant). Patent AICA and SCA origins. Fetal type left PCA origin. There is mild to moderate right P1 irregularity and stenosis beyond the right PCA origin (series 1050, image 11). There is mild to moderate bilateral PCA branch irregularity and stenosis with preserved distal flow signal. Antegrade flow in both ICA siphons. No siphon stenosis. Normal ophthalmic and left posterior communicating artery origins. Patent carotid termini. Patent MCA and ACA origins. The left A1 is mildly dominant. There is mild irregularity and stenosis at the left MCA origin. The right MCA origin is normal. Anterior communicating artery is normal. Mild right and mild-to-moderate left ACA A2 irregularity and stenosis (series 1046, image 7). The right M1 bifurcates early and is normal. There is mild right MCA branch irregularity. The left MCA also bifurcates early, and there is no proximal MCA stenosis but there is a severe stenosis of a left M2 branch best demonstrated on series 1046, image 5). IMPRESSION: 1. Neck MRA reveals mild extracranial atherosclerosis with no significant cervical carotid or vertebral artery stenosis. 2. Head MRA reveals moderate intracranial atherosclerosis. - up to moderate stenosis of the right PCA P1, both ACA A2 segments. - severe stenosis of a left MCA M2 branch. Electronically Signed   By: Genevie Ann M.D.   On: 10/04/2018 03:36   Mr Angio Neck W Wo Contrast  Result Date: 10/04/2018 CLINICAL DATA:  78 year old male with small acute white matter  infarct in the left corona radiata on brain MRI tonight. EXAM: MRA HEAD WITHOUT CONTRAST MRA NECK WITHOUT AND WITH CONTRAST TECHNIQUE: Multiplanar, multiecho pulse sequences of the brain and surrounding structures were obtained without and with intravenous contrast. Angiographic images of the Circle of Willis were obtained using MRA technique without intravenous contrast. Angiographic images of the neck were obtained using MRA technique without and with intravenous contrast. Carotid stenosis measurements (when applicable) are obtained utilizing NASCET criteria, using the distal internal carotid diameter as the denominator. CONTRAST:  10 milliliters Gadavist COMPARISON:  Brain MRI earlier tonight. FINDINGS: MRA NECK FINDINGS Precontrast time-of-flight imaging reveals antegrade flow in both cervical carotid and vertebral arteries. The left vertebral artery is dominant. There is a partially retropharyngeal course of the left carotid. Antegrade flow continues to the skull base. Post-contrast neck MRA images reveal  a 3 vessel arch configuration. Normal great vessel origins. Tortuous proximal right CCA. Mild irregularity and stenosis at the right ICA origin which does not appear hemodynamically significant. Otherwise negative cervical right ICA. Tortuous proximal left CCA. Mild irregularity at the left ICA origin without stenosis. Otherwise negative cervical left ICA. Tortuous proximal subclavian arteries more so the right. Non dominant right vertebral artery origin and cervical right vertebral arteries are within normal limits. Dominant left vertebral artery origin is not well visualized. The left vertebral artery is otherwise patent to the skull base without stenosis. Negative visible postcontrast ICA siphons and basilar artery. The non dominant right vertebral appears to functionally terminates in PICA. MRA HEAD FINDINGS Antegrade flow in the posterior circulation with dominant left vertebral artery supplying the  basilar. The non dominant right vertebral is diminutive beyond the PICA. The left AICA appears dominant. No distal left vertebral or basilar stenosis, there is mild basilar irregularity. The vertebrobasilar junction is mildly fenestrated (normal variant). Patent AICA and SCA origins. Fetal type left PCA origin. There is mild to moderate right P1 irregularity and stenosis beyond the right PCA origin (series 1050, image 11). There is mild to moderate bilateral PCA branch irregularity and stenosis with preserved distal flow signal. Antegrade flow in both ICA siphons. No siphon stenosis. Normal ophthalmic and left posterior communicating artery origins. Patent carotid termini. Patent MCA and ACA origins. The left A1 is mildly dominant. There is mild irregularity and stenosis at the left MCA origin. The right MCA origin is normal. Anterior communicating artery is normal. Mild right and mild-to-moderate left ACA A2 irregularity and stenosis (series 1046, image 7). The right M1 bifurcates early and is normal. There is mild right MCA branch irregularity. The left MCA also bifurcates early, and there is no proximal MCA stenosis but there is a severe stenosis of a left M2 branch best demonstrated on series 1046, image 5). IMPRESSION: 1. Neck MRA reveals mild extracranial atherosclerosis with no significant cervical carotid or vertebral artery stenosis. 2. Head MRA reveals moderate intracranial atherosclerosis. - up to moderate stenosis of the right PCA P1, both ACA A2 segments. - severe stenosis of a left MCA M2 branch. Electronically Signed   By: Genevie Ann M.D.   On: 10/04/2018 03:36   Mr Brain Wo Contrast (neuro Protocol)  Result Date: 10/04/2018 CLINICAL DATA:  78 year old male with altered mental status, aphasia. Age indeterminate right thalamic lacune on head CT earlier today. EXAM: MRI HEAD WITHOUT CONTRAST TECHNIQUE: Multiplanar, multiecho pulse sequences of the brain and surrounding structures were obtained without  intravenous contrast. COMPARISON:  Head CT 10/03/2018. Brain MRI and intracranial MRA 09/18/2017. FINDINGS: Brain: Small 6-7 millimeter focus of restricted diffusion in the left corona radiata on series 5, image 80. Background widespread cerebral white matter T2 and FLAIR hyperintensity. No associated hemorrhage or mass effect. No other No restricted diffusion or evidence of acute infarction. Expected evolution of the 2019 left basal ganglia infarct now with encephalomalacia. Chronic right basal ganglia and right thalamic lacunar infarcts. Bilateral cerebral white matter T2 and FLAIR hyperintensity has not significantly changed. Comparatively mild T2 heterogeneity in the pons is stable. No midline shift, mass effect, evidence of mass lesion, ventriculomegaly, extra-axial collection or acute intracranial hemorrhage. Cervicomedullary junction and pituitary are within normal limits. 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 Mild hemosiderin associated with the left basal ganglia infarct. Vascular: Major intracranial vascular flow voids stable since 2019. Dominant left vertebral artery.  Skull and upper cervical spine: Negative visible cervical spine. Visualized bone marrow signal is within normal limits. Sinuses/Orbits: Stable, negative. Other: Mastoids remain clear. Visible internal auditory structures appear normal. Scalp and face soft tissues appear stable. IMPRESSION: 1. Small acute white matter infarct in the left corona radiata. No associated hemorrhage or mass effect. 2. Underlying advanced chronic small vessel disease with expected evolution of the 2019 left basal ganglia infarct. Electronically Signed   By: Genevie Ann M.D.   On: 10/04/2018 00:21   ECHOCARDIOGRAM  IMPRESSIONS    1. The left ventricle has normal systolic function with an ejection fraction of 60-65%. The cavity size was normal. Left ventricular diastolic Doppler parameters are  consistent with impaired relaxation. Elevated mean left atrial pressure.  2. The right ventricle has normal systolic function. The cavity was normal.  3. The mitral valve is abnormal. Mild thickening of the mitral valve leaflet. Mild calcification of the mitral valve leaflet.  4. The tricuspid valve is grossly normal.  5. The aorta is abnormal in size and structure.  6. There is mild dilatation of the ascending aorta measuring 38 mm.  7. Normal LV systolic function; mild diastolic dysfunction; mildly dilated ascending aorta; trace AI.  FINDINGS  Left Ventricle: The left ventricle has normal systolic function, with an ejection fraction of 60-65%. The cavity size was normal. There is no increase in left ventricular wall thickness. Left ventricular diastolic Doppler parameters are consistent with  impaired relaxation. Elevated mean left atrial pressure  Right Ventricle: The right ventricle has normal systolic function. The cavity was normal.  Left Atrium: Left atrial size was normal in size.  Right Atrium: Right atrial size was normal in size.  Interatrial Septum: No atrial level shunt detected by color flow Doppler.  Pericardium: There is no evidence of pericardial effusion.  Mitral Valve: The mitral valve is abnormal. Mild thickening of the mitral valve leaflet. Mild calcification of the mitral valve leaflet. Mitral valve regurgitation is trivial by color flow Doppler.  Tricuspid Valve: The tricuspid valve is grossly normal. Tricuspid valve regurgitation is trivial by color flow Doppler.  Aortic Valve: The aortic valve is tricuspid Mild thickening of the aortic valve. Aortic valve regurgitation is trivial by color flow Doppler. There is No stenosis of the aortic .  Pulmonic Valve: The pulmonic valve was grossly normal. Pulmonic valve regurgitation is not visualized by color flow Doppler.  Aorta: The aorta is abnormal in size and structure. There is mild dilatation of the  ascending aorta measuring 38 mm.  Venous: The inferior vena cava is normal in size with greater than 50% respiratory variability.  Additional Comments: Normal LV systolic function; mild diastolic dysfunction; mildly dilated ascending aorta; trace AI.    +--------------+--------++ LEFT VENTRICLE              +----------------+---------++ +--------------+--------++      Diastology                PLAX 2D                     +----------------+---------++ +--------------+--------++      LV e' lateral:  5.87 cm/s LVIDd:        3.43 cm       +----------------+---------++ +--------------+--------++      LV E/e' lateral:12.3      LVIDs:        2.16 cm       +----------------+---------++ +--------------+--------++      LV e' medial:  4.57 cm/s LV PW:        0.94 cm       +----------------+---------++ +--------------+--------++      LV E/e' medial: 15.8      LV IVS:       1.12 cm       +----------------+---------++ +--------------+--------++ LVOT diam:    1.90 cm  +--------------+--------++ LV SV:        33 ml    +--------------+--------++ LV SV Index:  14.27    +--------------+--------++ LVOT Area:    2.84 cm +--------------+--------++                        +--------------+--------++   +------------------+---------++ LV Volumes (MOD)            +------------------+---------++ LV area d, A2C:   35.70 cm +------------------+---------++ LV area d, A4C:   33.80 cm +------------------+---------++ LV area s, A2C:   19.10 cm +------------------+---------++ LV area s, A4C:   21.20 cm +------------------+---------++ LV major d, A2C:  8.99 cm   +------------------+---------++ LV major d, A4C:  8.52 cm   +------------------+---------++ LV major s, A2C:  7.29 cm   +------------------+---------++ LV major s, A4C:  7.80 cm   +------------------+---------++ LV vol d, MOD A2C:117.0  ml  +------------------+---------++ LV vol d, MOD A4C:108.0 ml  +------------------+---------++ LV vol s, MOD A2C:43.6 ml   +------------------+---------++ LV vol s, MOD A4C:47.8 ml   +------------------+---------++ LV SV MOD A2C:    73.4 ml   +------------------+---------++ LV SV MOD A4C:    108.0 ml  +------------------+---------++ LV SV MOD BP:     68.5 ml   +------------------+---------++  +---------------+----------++ RIGHT VENTRICLE           +---------------+----------++ RV Basal diam: 3.40 cm    +---------------+----------++ RV Mid diam:   2.34 cm    +---------------+----------++ RV S prime:    11.70 cm/s +---------------+----------++ TAPSE (M-mode):2.4 cm     +---------------+----------++  +-------------+-------++-----------++ LEFT ATRIUM         Index       +-------------+-------++-----------++ LA diam:     4.00 cm1.74 cm/m  +-------------+-------++-----------++ LA Vol (A4C):37.3 ml16.23 ml/m +-------------+-------++-----------++ +------------+---------++----------++ RIGHT ATRIUM         Index      +------------+---------++----------++ RA Area:    10.20 cm           +------------+---------++----------++ RA Volume:  19.80 ml 8.62 ml/m +------------+---------++----------++  +------------------+------------++ +--------------+--------++ AORTIC VALVE                   PULMONIC VALVE         +------------------+------------++ +--------------+--------++ AV Area (Vmax):   1.88 cm     PV Vmax:      1.13 m/s +------------------+------------++ +--------------+--------++ AV Area (Vmean):  1.53 cm     PV Peak grad: 5.1 mmHg +------------------+------------++ +--------------+--------++ AV Area (VTI):    1.64 cm     +------------------+------------++ AV Vmax:          154.00 cm/s  +------------------+------------++ AV Vmean:         118.000  cm/s +------------------+------------++ AV VTI:           0.352 m      +------------------+------------++ AV Peak Grad:     9.5 mmHg     +------------------+------------++ AV Mean Grad:     6.0 mmHg     +------------------+------------++ LVOT Vmax:        102.00 cm/s  +------------------+------------++ LVOT Vmean:  63.700 cm/s  +------------------+------------++ LVOT VTI:         0.204 m      +------------------+------------++ LVOT/AV VTI ratio:0.58         +------------------+------------++   +-------------+-------++ AORTA                +-------------+-------++ Ao Root diam:2.80 cm +-------------+-------++ Ao Asc diam: 3.80 cm +-------------+-------++  +--------------+----------++ MITRAL VALVE              +--------------+-------+ +--------------+----------++  SHUNTS                MV Area (PHT):2.79 cm    +--------------+-------+ +--------------+----------++  Systemic VTI: 0.20 m  MV PHT:       78.88 msec  +--------------+-------+ +--------------+----------++  Systemic Diam:1.90 cm MV Decel Time:272 msec    +--------------+-------+ +--------------+----------++ +--------------+-----------++ MV E velocity:72.40 cm/s  +--------------+-----------++ MV A velocity:113.00 cm/s +--------------+-----------++ MV E/A ratio: 0.64        +--------------+-----------++  Subjective: Seen and examined at bedside and he improved rapidly and quicker than expected.  Still has some dysarthria and some aphasia but overall improved.  No nausea or vomiting.  Denies chest pain colitis or dizziness.  No other concerns or plans at this time and neurology cleared patient for discharge he will need to follow-up with PCP as well as neurology in the outpatient setting patient understands agrees with the plan of care.  Discharge Exam: Vitals:   10/04/18 1156 10/04/18 1703  BP: (!) 153/74 125/74  Pulse: 60 69   Resp: 18 18  Temp: 98 F (36.7 C) 98.5 F (36.9 C)  SpO2: 98% 96%   Vitals:   10/04/18 0558 10/04/18 0756 10/04/18 1156 10/04/18 1703  BP: (!) 148/74 (!) 155/75 (!) 153/74 125/74  Pulse: 70 65 60 69  Resp: 18 18 18 18   Temp: 98.1 F (36.7 C) 98 F (36.7 C) 98 F (36.7 C) 98.5 F (36.9 C)  TempSrc: Oral Oral Oral Oral  SpO2: 98% 97% 98% 96%  Weight:      Height:       General: Pt is alert, awake, not in acute distress Cardiovascular: RRR, S1/S2 +, no rubs, no gallops Respiratory: Diminished bilaterally, no wheezing, no rhonchi Abdominal: Soft, NT, Distended due to body habiuts, bowel sounds + Extremities: no appreciable edema, no cyanosis  The results of significant diagnostics from this hospitalization (including imaging, microbiology, ancillary and laboratory) are listed below for reference.    Microbiology: Recent Results (from the past 240 hour(s))  SARS Coronavirus 2 [facility] order, Performed in [facility] hospital lab) Nasopharyngeal Nasopharyngeal Swab     Status: None   Collection Time: 10/03/18  8:50 PM   Specimen: Nasopharyngeal Swab  Result Value Ref Range Status   SARS Coronavirus 2 NEGATIVE NEGATIVE Final    Comment: (NOTE) If result is NEGATIVE SARS-CoV-2 target nucleic acids are NOT DETECTED. The SARS-CoV-2 RNA is generally detectable in upper and lower  respiratory specimens during the acute phase of infection. The lowest  concentration of SARS-CoV-2 viral copies this assay can detect is 250  copies / mL. A negative result does not preclude SARS-CoV-2 infection  and should not be used as the sole basis for treatment or other  patient management decisions.  A negative result may occur with  improper specimen collection / handling, submission of specimen other  than nasopharyngeal swab, presence of viral mutation(s) within the  areas targeted by this assay, and inadequate number of viral copies  (<250 copies /  mL). A negative result must be combined  with clinical  observations, patient history, and epidemiological information. If result is POSITIVE SARS-CoV-2 target nucleic acids are DETECTED. The SARS-CoV-2 RNA is generally detectable in upper and lower  respiratory specimens dur ing the acute phase of infection.  Positive  results are indicative of active infection with SARS-CoV-2.  Clinical  correlation with patient history and other diagnostic information is  necessary to determine patient infection status.  Positive results do  not rule out bacterial infection or co-infection with other viruses. If result is PRESUMPTIVE POSTIVE SARS-CoV-2 nucleic acids MAY BE PRESENT.   A presumptive positive result was obtained on the submitted specimen  and confirmed on repeat testing.  While 2019 novel coronavirus  (SARS-CoV-2) nucleic acids may be present in the submitted sample  additional confirmatory testing may be necessary for epidemiological  and / or clinical management purposes  to differentiate between  SARS-CoV-2 and other Sarbecovirus currently known to infect humans.  If clinically indicated additional testing with an alternate test  methodology [phone]) is advised. The SARS-CoV-2 RNA is generally  detectable in upper and lower respiratory sp ecimens during the acute  phase of infection. The expected result is Negative. Fact Sheet for Patients:  StrictlyIdeas.no Fact Sheet for Healthcare Providers: BankingDealers.co.za This test is not yet approved or cleared by the [place] FDA and has been authorized for detection and/or diagnosis of SARS-CoV-2 by FDA under an Emergency Use Authorization (EUA).  This EUA will remain in effect (meaning this test can be used) for the duration of the COVID-19 declaration under Section 564(b)(1) of the Act, 21 U.S.C. section 360bbb-3(b)(1), unless the authorization is terminated or revoked sooner. Performed at [facility], [place] [address], [place],  [address]      Labs: BNP (last 3 results) No results for input(s): BNP in the last 8760 hours. Basic Metabolic Panel: Recent Labs  Lab 10/03/18 1623 10/03/18 1635 10/04/18 0950  NA 138 137 135  K 3.8 3.7 3.7  CL 102 100 98  CO2 24  --  27  GLUCOSE 90 85 196*  BUN 17 18 16   CREATININE 1.13 1.00 1.02  CALCIUM 9.8  --  8.8*  MG  --   --  1.8  PHOS  --   --  2.6   Liver Function Tests: Recent Labs  Lab 10/03/18 1623 10/04/18 0950  AST 18 18  ALT 16 15  ALKPHOS 32* 29*  BILITOT 1.1 1.1  PROT 6.6 5.8*  ALBUMIN 3.9 3.5   No results for input(s): LIPASE, AMYLASE in the last 168 hours. No results for input(s): AMMONIA in the last 168 hours. CBC: Recent Labs  Lab 10/03/18 1623 10/03/18 1635 10/04/18 0950  WBC 9.1  --  7.2  NEUTROABS 5.5  --  4.1  HGB 15.7 15.6 14.9  HCT 46.2 46.0 44.4  MCV 83.1  --  83.5  PLT 285  --  245   Cardiac Enzymes: No results for input(s): CKTOTAL, CKMB, CKMBINDEX, TROPONINI in the last 168 hours. BNP: Invalid input(s): POCBNP CBG: Recent Labs  Lab 10/03/18 2141 10/03/18 2314 10/04/18 0614 10/04/18 1153 10/04/18 1658  GLUCAP 77 73 139* 125* 140*   D-Dimer No results for input(s): DDIMER in the last 72 hours. Hgb A1c Recent Labs    10/04/18 0401  HGBA1C 6.9*   Lipid Profile Recent Labs    10/04/18 0401  CHOL 108  HDL 30*  LDLCALC 48  TRIG 149  CHOLHDL  3.6   Thyroid function studies No results for input(s): TSH, T4TOTAL, T3FREE, THYROIDAB in the last 72 hours.  Invalid input(s): FREET3 Anemia work up No results for input(s): VITAMINB12, FOLATE, FERRITIN, TIBC, IRON, RETICCTPCT in the last 72 hours. Urinalysis    Component Value Date/Time   COLORURINE YELLOW 10/03/2018 1800   APPEARANCEUR CLEAR 10/03/2018 1800   LABSPEC 1.015 10/03/2018 1800   PHURINE 7.0 10/03/2018 1800   GLUCOSEU NEGATIVE 10/03/2018 1800   HGBUR NEGATIVE 10/03/2018 1800   BILIRUBINUR NEGATIVE 10/03/2018 1800    KETONESUR NEGATIVE 10/03/2018 1800   PROTEINUR NEGATIVE 10/03/2018 1800   UROBILINOGEN 4.0 (H) 06/18/2009 1928   NITRITE NEGATIVE 10/03/2018 1800   LEUKOCYTESUR NEGATIVE 10/03/2018 1800   Sepsis Labs Invalid input(s): PROCALCITONIN,  WBC,  LACTICIDVEN Microbiology Recent Results (from the past 240 hour(s))  SARS Coronavirus 2 [facility] order, Performed in [facility] hospital lab) Nasopharyngeal Nasopharyngeal Swab     Status: None   Collection Time: 10/03/18  8:50 PM   Specimen: Nasopharyngeal Swab  Result Value Ref Range Status   SARS Coronavirus 2 NEGATIVE NEGATIVE Final    Comment: (NOTE) If result is NEGATIVE SARS-CoV-2 target nucleic acids are NOT DETECTED. The SARS-CoV-2 RNA is generally detectable in upper and lower  respiratory specimens during the acute phase of infection. The lowest  concentration of SARS-CoV-2 viral copies this assay can detect is 250  copies / mL. A negative result does not preclude SARS-CoV-2 infection  and should not be used as the sole basis for treatment or other  patient management decisions.  A negative result may occur with  improper specimen collection / handling, submission of specimen other  than nasopharyngeal swab, presence of viral mutation(s) within the  areas targeted by this assay, and inadequate number of viral copies  (<250 copies / mL). A negative result must be combined with clinical  observations, patient history, and epidemiological information. If result is POSITIVE SARS-CoV-2 target nucleic acids are DETECTED. The SARS-CoV-2 RNA is generally detectable in upper and lower  respiratory specimens dur ing the acute phase of infection.  Positive  results are indicative of active infection with SARS-CoV-2.  Clinical  correlation with patient history and other diagnostic information is  necessary to determine patient infection status.  Positive results do  not rule out bacterial infection or co-infection with other viruses. If  result is PRESUMPTIVE POSTIVE SARS-CoV-2 nucleic acids MAY BE PRESENT.   A presumptive positive result was obtained on the submitted specimen  and confirmed on repeat testing.  While 2019 novel coronavirus  (SARS-CoV-2) nucleic acids may be present in the submitted sample  additional confirmatory testing may be necessary for epidemiological  and / or clinical management purposes  to differentiate between  SARS-CoV-2 and other Sarbecovirus currently known to infect humans.  If clinically indicated additional testing with an alternate test  methodology [phone]) is advised. The SARS-CoV-2 RNA is generally  detectable in upper and lower respiratory sp ecimens during the acute  phase of infection. The expected result is Negative. Fact Sheet for Patients:  StrictlyIdeas.no Fact Sheet for Healthcare Providers: BankingDealers.co.za This test is not yet approved or cleared by the [place] FDA and has been authorized for detection and/or diagnosis of SARS-CoV-2 by FDA under an Emergency Use Authorization (EUA).  This EUA will remain in effect (meaning this test can be used) for the duration of the COVID-19 declaration under Section 564(b)(1) of the Act, 21 U.S.C. section 360bbb-3(b)(1), unless the authorization is terminated or revoked sooner.  Performed at [facility], [place] [address], [place], [place] [address]    Time coordinating discharge: 35 minutes  SIGNED:  Kerney Elbe, DO [facility] 10/04/2018, 7:46 PM Pager is on [place]  If 7PM-7AM, please contact night-coverage [email or web address] Password [email or web address]

## 2018-10-04 NOTE — Evaluation (Signed)
Occupational Therapy Evaluation Patient Details Name: Justin [name] MRN: [id number] DOB: 1940-12-15 Today's Date: 10/04/2018    History of Present Illness 78 y.o. male with medical history significant of brain bleed, prostate cancer, type 2 diabetes, hypertension, hyperlipidemia, CVA in 2019 presenting to the hospital via EMS for evaluation of slurred speech which started 8/11. Admitted for r/o of CVA MRI revealed small acute white matter infarct in L corona radiata.    Clinical Impression   Pt PTA: living with spouse and reports independence with ADL and mobility mostly furniture walking with 2 recent fall sin 3 months. Pt currently limited by expressive difficulties and ability to problem solve and follow commands. Pt performing ADL tasks with supervisionA with minimal verbal cues for problem solving. Pt using RW for stability with minguardA in room. Pt's spouse reports mobility looks similar to home setting. Pt's sensation and vision are intact.Pt would benefit from continued OT skilled services for ADL, mobility and higher level cognitive tasks. OT following acutely.    Follow Up Recommendations  Home health OT;Supervision/Assistance - 24 hour    Equipment Recommendations  None recommended by OT    Recommendations for Other Services       Precautions / Restrictions Precautions Precautions: Fall Precaution Comments: recent fall stepping over divider in garden Restrictions Weight Bearing Restrictions: No      Mobility Bed Mobility Overal bed mobility: Needs Assistance Bed Mobility: Supine to Sit;Sit to Supine     Supine to sit: Min guard Sit to supine: Min guard   General bed mobility comments: min guard for safety, increased effort for managing LE out of/into bed  Transfers Overall transfer level: Needs assistance Equipment used: None Transfers: Sit to/from Stand Sit to Stand: Min assist;Min guard;From elevated surface         General transfer comment: Pt leaning  to R side requiring maxA for power up for first trial. Then with RW in front, pt minA for power up.    Balance Overall balance assessment: Needs assistance Sitting-balance support: Feet supported;No upper extremity supported Sitting balance-Leahy Scale: Fair     Standing balance support: During functional activity;Single extremity supported;No upper extremity supported Standing balance-Leahy Scale: Fair Standing balance comment: initially requires UE support for balance once in bathroom able to urinate standing up with no outside assist                           ADL either performed or assessed with clinical judgement   ADL Overall ADL's : At baseline                                       General ADL Comments: Pt requires supervision to minguardA for ADL, but pt able to follow commands to perform each ADL with increased time     Vision Baseline Vision/History: No visual deficits Vision Assessment?: Yes Eye Alignment: Within Functional Limits Ocular Range of Motion: Within Functional Limits Alignment/Gaze Preference: Within Defined Limits Tracking/Visual Pursuits: Able to track stimulus in all quads without difficulty     Perception     Praxis      Pertinent Vitals/Pain Pain Assessment: No/denies pain     Hand Dominance Right   Extremity/Trunk Assessment Upper Extremity Assessment Upper Extremity Assessment: Overall WFL for tasks assessed;RUE deficits/detail;LUE deficits/detail RUE Coordination: decreased fine motor LUE Coordination: decreased fine motor   Lower Extremity Assessment  Lower Extremity Assessment: Defer to PT evaluation RLE Deficits / Details: ROM WFL, strength grossly 3+/5 RLE Sensation: WNL RLE Coordination: decreased fine motor LLE Deficits / Details: ROM WFL, strength grossly 3+/5 LLE Sensation: WNL LLE Coordination: decreased fine motor   Cervical / Trunk Assessment Cervical / Trunk Assessment: Normal    Communication Communication Communication: Expressive difficulties   Cognition Arousal/Alertness: Awake/alert Behavior During Therapy: Impulsive Overall Cognitive Status: Impaired/Different from baseline Area of Impairment: Memory;Safety/judgement;Following commands;Awareness;Problem solving;Attention                   Current Attention Level: Selective Memory: Decreased short-term memory Following Commands: Follows multi-step commands inconsistently;Follows one step commands with increased time;Follows multi-step commands with increased time Safety/Judgement: Decreased awareness of safety;Decreased awareness of deficits Awareness: Emergent Problem Solving: Slow processing;Decreased initiation;Difficulty sequencing;Requires verbal cues;Requires tactile cues General Comments: Pt impulsive, but listening to cues to perform tasks   General Comments  Spouse present.    Exercises     Shoulder Instructions      Home Living Family/patient expects to be discharged to:: Private residence Living Arrangements: Spouse/significant other Available Help at Discharge: Family;Available 24 hours/day Type of Home: House Home Access: Stairs to enter [employer] of Steps: 3 Entrance Stairs-Rails: Can reach both Home Layout: One level     Bathroom Shower/Tub: Occupational psychologist: Standard Bathroom Accessibility: Yes   Home Equipment: Grab bars - tub/shower;Hand held shower head;Shower seat          Prior Functioning/Environment Level of Independence: Independent        Comments: reports ambulation without AD, however noted to reach out for objects in room to steady, reports his furniture is placed so that he can get around at home. reports use of grocery cart to steady at grocery store, independent in ADLs wife assist for most iADLS        OT Problem List: Decreased strength;Decreased activity tolerance;Impaired balance (sitting and/or  standing);Decreased coordination;Decreased cognition;Decreased safety awareness      OT Treatment/Interventions: Self-care/ADL training;Therapeutic exercise;Neuromuscular education;DME and/or AE instruction;Energy conservation;Therapeutic activities;Patient/family education;Balance training    OT Goals(Current goals can be found in the care plan section) Acute Rehab OT Goals Patient Stated Goal: go home OT Goal Formulation: With patient Time For Goal Achievement: 10/18/18 Potential to Achieve Goals: Good ADL Goals Pt Will Perform Lower Body Dressing: with modified independence;sitting/lateral leans;sit to/from stand Pt Will Transfer to Toilet: with modified independence;ambulating Additional ADL Goal #1: Pt will perform OOB ADL with supervision level assist with no verbal cues for proper technique  OT Frequency: Min 2X/week   Barriers to D/C:            Co-evaluation              AM-PAC OT "6 Clicks" Daily Activity     Outcome Measure Help from another person eating meals?: None Help from another person taking care of personal grooming?: A Little Help from another person toileting, which includes using toliet, bedpan, or urinal?: A Little Help from another person bathing (including washing, rinsing, drying)?: A Little Help from another person to put on and taking off regular upper body clothing?: A Little Help from another person to put on and taking off regular lower body clothing?: A Little 6 Click Score: 19   End of Session Equipment Utilized During Treatment: Gait belt;Rolling walker Nurse Communication: Mobility status  Activity Tolerance: Patient tolerated treatment well Patient left: in bed;with call bell/phone within reach;with bed alarm set  OT Visit Diagnosis: Unsteadiness on feet (R26.81);Muscle weakness (generalized) (M62.81);Other symptoms and signs involving cognitive function                Time: 6815-9470 OT Time Calculation (min): 25 min Charges:  OT  General Charges $OT Visit: 1 Visit OT Evaluation $OT Eval Moderate Complexity: 1 Mod OT Treatments $Self Care/Home Management : 8-22 mins  Ebony Hail Harold Hedge) Marsa Aris OTR/L Acute Rehabilitation Services Pager: [phone] Office: [place] 10/04/2018, 4:39 PM

## 2018-10-04 NOTE — Evaluation (Signed)
Physical Therapy Evaluation Patient Details Name: Justin [name] MRN: [id number] DOB: [birth year] Today's Date: 10/04/2018   History of Present Illness  78 y.o. male with medical history significant of brain bleed, prostate cancer, type 2 diabetes, hypertension, hyperlipidemia, CVA in 2019 presenting to the hospital via EMS for evaluation of slurred speech which started 8/11. Admitted for r/o of CVA MRI revealed small acute white matter infarct in L corona radiata.   Clinical Impression  PTA pt living with wife in single story home with 3 steps to enter. Pt reports independence with ambulation, however admits that furniture is placed in his house so that he can use it for support. Pt is driving and independent in ADLs, wife assists with iADLs. Pt exhibits slow processing and speech throughout session, admitting that if two people are talking at the same time he has trouble following the conversation. Pt is limited in his safe mobility by decreased safety awareness in the presence of decreased balance. PT recommending HHPT to work on balance and safety at d/c. PT will continue to follow acutely, however hopeful for d/c today.     Follow Up Recommendations Home health PT;Supervision/Assistance - 24 hour    Equipment Recommendations  Rolling walker with 5" wheels    Recommendations for Other Services       Precautions / Restrictions Precautions Precautions: Fall Precaution Comments: recent fall stepping over divider in garden Restrictions Weight Bearing Restrictions: No      Mobility  Bed Mobility Overal bed mobility: Needs Assistance Bed Mobility: Supine to Sit;Sit to Supine     Supine to sit: Min guard Sit to supine: Min guard   General bed mobility comments: min guard for safety, increased effort for managing LE out of/into bed  Transfers Overall transfer level: Needs assistance Equipment used: None Transfers: Sit to/from Stand Sit to Stand: Min assist;Min guard;From elevated  surface         General transfer comment: min guard for powerup, intially attempted from low bed surface, pt reaching out for nearby straight chair, increased bed height and able to come to standing with increased effort but requires min A for steadying, pt refused use of RW for steadying  Ambulation/Gait Ambulation/Gait assistance: Min assist Gait Distance (Feet): 20 Feet Assistive device: 1 person hand held assist Gait Pattern/deviations: Step-through pattern;Decreased step length - left;Decreased step length - right;Shuffle;Narrow base of support;Trunk flexed Gait velocity: slowed Gait velocity interpretation: <1.8 ft/sec, indicate of risk for recurrent falls General Gait Details: minA for steadying with gait <>bathroom, pt reaching out for sink to assist with steadying      Modified Rankin (Stroke Patients Only) Modified Rankin (Stroke Patients Only) Pre-Morbid Rankin Score: No symptoms Modified Rankin: No significant disability     Balance Overall balance assessment: Needs assistance Sitting-balance support: Feet supported;No upper extremity supported Sitting balance-Leahy Scale: Fair     Standing balance support: During functional activity;Single extremity supported;No upper extremity supported Standing balance-Leahy Scale: Fair Standing balance comment: initially requires UE support for balance once in bathroom able to urinate standing up with no outside assist                             Pertinent Vitals/Pain Pain Assessment: No/denies pain    Home Living Family/patient expects to be discharged to:: Private residence Living Arrangements: Spouse/significant other Available Help at Discharge: Family;Available 24 hours/day Type of Home: House Home Access: Stairs to enter Entrance Stairs-Rails: Can reach both Entrance  Stairs-Number of Steps: 3 Home Layout: One level Home Equipment: Grab bars - tub/shower;Hand held shower head;Shower seat      Prior  Function Level of Independence: Independent         Comments: reports ambulation without AD, however noted to reach out for objects in room to steady, reports his furniture is placed so that he can get around at home. reports use of grocery cart to steady at grocery store, independent in ADLs wife assist for most iADLS     Hand Dominance   Dominant Hand: Right    Extremity/Trunk Assessment   Upper Extremity Assessment Upper Extremity Assessment: Overall WFL for tasks assessed    Lower Extremity Assessment Lower Extremity Assessment: RLE deficits/detail;LLE deficits/detail RLE Deficits / Details: ROM WFL, strength grossly 3+/5 RLE Sensation: WNL RLE Coordination: decreased fine motor LLE Deficits / Details: ROM WFL, strength grossly 3+/5 LLE Sensation: WNL LLE Coordination: decreased fine motor       Communication   Communication: Expressive difficulties(speech slowed)  Cognition Arousal/Alertness: Awake/alert Behavior During Therapy: Impulsive Overall Cognitive Status: Impaired/Different from baseline Area of Impairment: Memory;Safety/judgement;Following commands;Awareness;Problem solving;Attention                   Current Attention Level: Selective Memory: Decreased short-term memory Following Commands: Follows multi-step commands inconsistently;Follows one step commands with increased time;Follows multi-step commands with increased time Safety/Judgement: Decreased awareness of safety;Decreased awareness of deficits Awareness: Emergent Problem Solving: Slow processing;Decreased initiation;Difficulty sequencing;Requires verbal cues;Requires tactile cues General Comments: overall processing slowed, speech slow, impulsive in his movements and not paying attention to bed alarm getting up without assistance      General Comments General comments (skin integrity, edema, etc.): VSS, Wife present throughout session, and clarified hx of independence     Exercises      Assessment/Plan    PT Assessment Patient needs continued PT services  PT Problem List Decreased balance;Decreased safety awareness;Decreased cognition       PT Treatment Interventions DME instruction;Gait training;Stair training;Functional mobility training;Therapeutic activities;Therapeutic exercise;Balance training;Cognitive remediation;Patient/family education    PT Goals (Current goals can be found in the Care Plan section)  Acute Rehab PT Goals Patient Stated Goal: go home PT Goal Formulation: With patient Time For Goal Achievement: 10/18/18 Potential to Achieve Goals: Good    Frequency Min 4X/week    AM-PAC PT "6 Clicks" Mobility  Outcome Measure Help needed turning from your back to your side while in a flat bed without using bedrails?: None Help needed moving from lying on your back to sitting on the side of a flat bed without using bedrails?: None Help needed moving to and from a bed to a chair (including a wheelchair)?: None Help needed standing up from a chair using your arms (e.g., wheelchair or bedside chair)?: A Little Help needed to walk in hospital room?: A Little Help needed climbing 3-5 steps with a railing? : A Little 6 Click Score: 21    End of Session   Activity Tolerance: Patient tolerated treatment well Patient left: in bed;with call bell/phone within reach;with bed alarm set;with family/visitor present Nurse Communication: Mobility status PT Visit Diagnosis: Unsteadiness on feet (R26.81);Other abnormalities of gait and mobility (R26.89);History of falling (Z91.81);Muscle weakness (generalized) (M62.81);Difficulty in walking, not elsewhere classified (R26.2)    Time: 5009-3818 PT Time Calculation (min) (ACUTE ONLY): 18 min   Charges:   PT Evaluation $PT Eval Moderate Complexity: 1 Mod          Laelyn Blumenthal B. Migdalia Dk PT, DPT  Acute Rehabilitation Services Pager [phone] Office [phone]   [place] 10/04/2018, 4:03  PM

## 2018-10-04 NOTE — Evaluation (Signed)
Speech Language Pathology Evaluation Patient Details Name: Justin [name] MRN: [id number] DOB: 06/20/1940 Today's Date: 10/04/2018 Time: 8527-7824 SLP Time Calculation (min) (ACUTE ONLY): 41 min  Problem List:  Patient Active Problem List   Diagnosis Date Noted  . Type 2 diabetes mellitus ([place]) 10/04/2018  . Depression 10/04/2018  . Acute CVA (cerebrovascular accident) ([place]) 09/18/2017  . Stroke (cerebrum) ([place]) 09/17/2017  . Diabetes mellitus without complication ([place]) 23/53/6144  . Closed head injury 04/22/2013  . HLD (hyperlipidemia) 07/29/2009  . Essential hypertension 07/29/2009  . EFFUSION, PLEURAL 07/29/2009  . DIVERTICULITIS, COLON 07/29/2009  . 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 COLONIC POLYPS, HYPERPLASTIC, HX OF 07/29/2009   Past Medical History:  Past Medical History:  Diagnosis Date  . Brain bleed ([place])   . Cancer [facility])    prostate  . Depression   . Diabetes mellitus without complication ([place])   . High cholesterol   . Hypertension   . Stroke [facility])    Past Surgical History:  Past Surgical History:  Procedure Laterality Date  . arm & hand surgery Left 1972   skill saw accident  . COLON SURGERY     partial  . EYE SURGERY     lens implant and cateract- Left  . PROSTATECTOMY     HPI:  Pt is a 78 y.o. male with medical history significant of brain bleed, prostate cancer, type 2 diabetes, hypertension, hyperlipidemia, CVA in 2019 who presented to the hospital via EMS for evaluation of slurred speech. MRI of the brain revealed small acute white matter infarct in the left corona radiata. Underlying advanced chronic small vessel disease with expected evolution of the 2019 left basal ganglia infarct.    Assessment / Plan / Recommendation Clinical Impression  Pt was seen for evaluation with his wife present. [name] reported that the pt had been demonstrating increased difficulty with memory prior to admission but his short term memory appears worse today. [name]  also cited acute motor speech deficits and difficulty with word retrieval. He demonstrated moderate dysarthria characterized by reduced articulatory precision and a reduced vocal intensity which together negatively impacted speech intelligibility at the sentence level. He also presented with mild aphasia characterized by impairments in receptive and expressive language. Receptively he was able to answer complex yes/no questions and inconsistently follow 2-step commands but he exhibited increased difficulty with auditory comprehension beyond this level (e.g., paragraph level and 3-step commands). Expressively, he communicated at the sentence level with additional processing time. Additional processing time was also needed for word retrieval during structured tasks and semantic paraphasias were intermittently noted.   With regards to cognition, pt exhibited difficulty with attention as well as memory and the impact of this on his performance during auditory comprehension tasks is considered. Skilled SLP services are clinically indicated at this time to improve the pt's motor speech and language skills.      SLP Assessment  SLP Recommendation/Assessment: Patient needs continued Speech Lanaguage Pathology Services SLP Visit Diagnosis: Aphasia (R47.01);Dysarthria and anarthria (R47.1)    Follow Up Recommendations  Home health SLP;24 hour supervision/assistance    Frequency and Duration min 2x/week  2 weeks      SLP Evaluation Cognition  Overall Cognitive Status: Impaired/Different from baseline Arousal/Alertness: Awake/alert Orientation Level: Oriented to person;Oriented to place;Oriented to situation(Oriented to month, year, day not date (stated 13th)) Attention: Focused;Sustained Focused Attention: Impaired Focused Attention Impairment: Verbal complex Sustained Attention: Impaired Sustained Attention Impairment: Verbal complex Memory: Impaired Memory Impairment: Storage deficit;Decreased recall  of new  information;Retrieval deficit(Immediate: 2/3; delayed: 1/3; with cues: 2/2) Awareness: Impaired Awareness Impairment: Intellectual impairment Problem Solving: Impaired Problem Solving Impairment: Verbal complex       Comprehension  Auditory Comprehension Overall Auditory Comprehension: Impaired Yes/No Questions: Impaired Basic Biographical Questions: (5/5) Complex Questions: (5/5) Paragraph Comprehension (via yes/no questions): (2/4) Commands: Impaired Two Step Basic Commands: (2/4) Multistep Basic Commands: (0/4) Conversation: Simple Reading Comprehension Reading Status: Not tested    Expression Expression Primary Mode of Expression: Verbal Verbal Expression Overall Verbal Expression: Impaired Initiation: Impaired Automatic Speech: Counting;Day of week;Month of year(Counting: 10/10; Days: 6/7; Months: 12/12) Level of Generative/Spontaneous Verbalization: Conversation Repetition: No impairment(5/5) Naming: Impairment Responsive: (4/5) Confrontation: Within functional limits(10/10) Convergent: (5/55) Divergent: (0/1) Pragmatics: No impairment Written Expression Dominant Hand: Right   Oral / Motor  Oral Motor/Sensory Function Overall Oral Motor/Sensory Function: Mild impairment Motor Speech Overall Motor Speech: Impaired Respiration: Impaired Level of Impairment: Sentence Phonation: Low vocal intensity Resonance: Within functional limits Articulation: Impaired Level of Impairment: Sentence Intelligibility: Intelligibility reduced Word: 75-100% accurate Phrase: 75-100% accurate Sentence: 50-74% accurate Conversation: 50-74% accurate Motor Planning: Witnin functional limits Motor Speech Errors: Aware;Consistent   Dayani Winbush I. Hardin Negus, [place], [place] Office number [phone] Pager [place] 10/04/2018, 5:51 PM

## 2018-10-04 NOTE — Progress Notes (Signed)
STROKE TEAM PROGRESS NOTE   SUBJECTIVE (INTERVAL HISTORY) No family is at the bedside.  Overall his condition is rapidly improving. Pt stated that he is doing well and speech near normal now and not confused. He admitted that he did not take plavix for several days.   OBJECTIVE Temp:  [98 F (36.7 C)-98.7 F (37.1 C)] 98 F (36.7 C) (08/12 1156) Pulse Rate:  [55-75] 60 (08/12 1156) Cardiac Rhythm: Normal sinus rhythm;Bundle branch block (08/12 0700) Resp:  [13-23] 18 (08/12 1156) BP: (92-155)/(52-97) 153/74 (08/12 1156) SpO2:  [92 %-98 %] 98 % (08/12 1156) Weight:  [98.9 kg] 98.9 kg (08/11 1623)  Recent Labs  Lab 10/03/18 2141 10/03/18 2314 10/04/18 0614 10/04/18 1153  GLUCAP 77 73 139* 125*   Recent Labs  Lab 10/03/18 1623 10/03/18 1635 10/04/18 0950  NA 138 137 135  K 3.8 3.7 3.7  CL 102 100 98  CO2 24  --  27  GLUCOSE 90 85 196*  BUN 17 18 16   CREATININE 1.13 1.00 1.02  CALCIUM 9.8  --  8.8*  MG  --   --  1.8  PHOS  --   --  2.6   Recent Labs  Lab 10/03/18 1623 10/04/18 0950  AST 18 18  ALT 16 15  ALKPHOS 32* 29*  BILITOT 1.1 1.1  PROT 6.6 5.8*  ALBUMIN 3.9 3.5   Recent Labs  Lab 10/03/18 1623 10/03/18 1635 10/04/18 0950  WBC 9.1  --  7.2  NEUTROABS 5.5  --  4.1  HGB 15.7 15.6 14.9  HCT 46.2 46.0 44.4  MCV 83.1  --  83.5  PLT 285  --  245   No results for input(s): CKTOTAL, CKMB, CKMBINDEX, TROPONINI in the last 168 hours. Recent Labs    10/03/18 1623  LABPROT 14.0  INR 1.1   Recent Labs    10/03/18 1800  COLORURINE YELLOW  LABSPEC 1.015  PHURINE 7.0  GLUCOSEU NEGATIVE  HGBUR NEGATIVE  BILIRUBINUR NEGATIVE  KETONESUR NEGATIVE  PROTEINUR NEGATIVE  NITRITE NEGATIVE  LEUKOCYTESUR NEGATIVE       Component Value Date/Time   CHOL 108 10/04/2018 0401   TRIG 149 10/04/2018 0401   HDL 30 (L) 10/04/2018 0401   CHOLHDL 3.6 10/04/2018 0401   VLDL 30 10/04/2018 0401   LDLCALC 48 10/04/2018 0401   Lab Results  Component Value Date    HGBA1C 6.9 (H) 10/04/2018      Component Value Date/Time   LABOPIA NONE DETECTED 10/03/2018 1800   COCAINSCRNUR NONE DETECTED 10/03/2018 1800   LABBENZ NONE DETECTED 10/03/2018 1800   AMPHETMU NONE DETECTED 10/03/2018 1800   THCU NONE DETECTED 10/03/2018 1800   LABBARB NONE DETECTED 10/03/2018 1800    Recent Labs  Lab 10/03/18 1623  ETH <10    I have personally reviewed the radiological images below and agree with the radiology interpretations.  Ct Head Wo Contrast  Result Date: 10/03/2018 CLINICAL DATA:  Aphasia. EXAM: CT HEAD WITHOUT CONTRAST TECHNIQUE: Contiguous axial images were obtained from the base of the skull through the vertex without intravenous contrast. COMPARISON:  CT head dated December 07, 2017. FINDINGS: Brain: No evidence of acute infarction, hemorrhage, hydrocephalus, extra-axial collection or mass lesion/mass effect. Stable atrophy and chronic microvascular ischemic changes. Old bilateral basal ganglia infarcts. New small lacunar infarct in the right thalamus, likely chronic. Vascular: Atherosclerotic vascular calcification of the carotid siphons. No hyperdense vessel or unexpected calcification. Skull: Normal. Negative for fracture or focal lesion. Sinuses/Orbits: No acute  finding. Other: None. IMPRESSION: 1.  No acute intracranial abnormality. 2. Small lacunar infarct in the right thalamus, new since the prior study, but likely chronic. 3. Old bilateral basal ganglia infarcts. 4. Stable atrophy and chronic microvascular ischemic changes. Electronically Signed   By: Titus Dubin M.D.   On: 10/03/2018 19:09   Mr Angio Head Wo Contrast  Result Date: 10/04/2018 CLINICAL DATA:  78 year old male with small acute white matter infarct in the left corona radiata on brain MRI tonight. EXAM: MRA HEAD WITHOUT CONTRAST MRA NECK WITHOUT AND WITH CONTRAST TECHNIQUE: Multiplanar, multiecho pulse sequences of the brain and surrounding structures were obtained without and with  intravenous contrast. Angiographic images of the Circle of Willis were obtained using MRA technique without intravenous contrast. Angiographic images of the neck were obtained using MRA technique without and with intravenous contrast. Carotid stenosis measurements (when applicable) are obtained utilizing NASCET criteria, using the distal internal carotid diameter as the denominator. CONTRAST:  10 milliliters Gadavist COMPARISON:  Brain MRI earlier tonight. FINDINGS: MRA NECK FINDINGS Precontrast time-of-flight imaging reveals antegrade flow in both cervical carotid and vertebral arteries. The left vertebral artery is dominant. There is a partially retropharyngeal course of the left carotid. Antegrade flow continues to the skull base. Post-contrast neck MRA images reveal a 3 vessel arch configuration. Normal great vessel origins. Tortuous proximal right CCA. Mild irregularity and stenosis at the right ICA origin which does not appear hemodynamically significant. Otherwise negative cervical right ICA. Tortuous proximal left CCA. Mild irregularity at the left ICA origin without stenosis. Otherwise negative cervical left ICA. Tortuous proximal subclavian arteries more so the right. Non dominant right vertebral artery origin and cervical right vertebral arteries are within normal limits. Dominant left vertebral artery origin is not well visualized. The left vertebral artery is otherwise patent to the skull base without stenosis. Negative visible postcontrast ICA siphons and basilar artery. The non dominant right vertebral appears to functionally terminates in PICA. MRA HEAD FINDINGS Antegrade flow in the posterior circulation with dominant left vertebral artery supplying the basilar. The non dominant right vertebral is diminutive beyond the PICA. The left AICA appears dominant. No distal left vertebral or basilar stenosis, there is mild basilar irregularity. The vertebrobasilar junction is mildly fenestrated (normal  variant). Patent AICA and SCA origins. Fetal type left PCA origin. There is mild to moderate right P1 irregularity and stenosis beyond the right PCA origin (series 1050, image 11). There is mild to moderate bilateral PCA branch irregularity and stenosis with preserved distal flow signal. Antegrade flow in both ICA siphons. No siphon stenosis. Normal ophthalmic and left posterior communicating artery origins. Patent carotid termini. Patent MCA and ACA origins. The left A1 is mildly dominant. There is mild irregularity and stenosis at the left MCA origin. The right MCA origin is normal. Anterior communicating artery is normal. Mild right and mild-to-moderate left ACA A2 irregularity and stenosis (series 1046, image 7). The right M1 bifurcates early and is normal. There is mild right MCA branch irregularity. The left MCA also bifurcates early, and there is no proximal MCA stenosis but there is a severe stenosis of a left M2 branch best demonstrated on series 1046, image 5). IMPRESSION: 1. Neck MRA reveals mild extracranial atherosclerosis with no significant cervical carotid or vertebral artery stenosis. 2. Head MRA reveals moderate intracranial atherosclerosis. - up to moderate stenosis of the right PCA P1, both ACA A2 segments. - severe stenosis of a left MCA M2 branch. Electronically Signed   By: Lemmie Evens  [place] Crane M.D.   On: 10/04/2018 03:36   Mr Angio Neck W Wo Contrast  Result Date: 10/04/2018 CLINICAL DATA:  78 year old male with small acute white matter infarct in the left corona radiata on brain MRI tonight. EXAM: MRA HEAD WITHOUT CONTRAST MRA NECK WITHOUT AND WITH CONTRAST TECHNIQUE: Multiplanar, multiecho pulse sequences of the brain and surrounding structures were obtained without and with intravenous contrast. Angiographic images of the Circle of Willis were obtained using MRA technique without intravenous contrast. Angiographic images of the neck were obtained using MRA technique without and with intravenous  contrast. Carotid stenosis measurements (when applicable) are obtained utilizing NASCET criteria, using the distal internal carotid diameter as the denominator. CONTRAST:  10 milliliters Gadavist COMPARISON:  Brain MRI earlier tonight. FINDINGS: MRA NECK FINDINGS Precontrast time-of-flight imaging reveals antegrade flow in both cervical carotid and vertebral arteries. The left vertebral artery is dominant. There is a partially retropharyngeal course of the left carotid. Antegrade flow continues to the skull base. Post-contrast neck MRA images reveal a 3 vessel arch configuration. Normal great vessel origins. Tortuous proximal right CCA. Mild irregularity and stenosis at the right ICA origin which does not appear hemodynamically significant. Otherwise negative cervical right ICA. Tortuous proximal left CCA. Mild irregularity at the left ICA origin without stenosis. Otherwise negative cervical left ICA. Tortuous proximal subclavian arteries more so the right. Non dominant right vertebral artery origin and cervical right vertebral arteries are within normal limits. Dominant left vertebral artery origin is not well visualized. The left vertebral artery is otherwise patent to the skull base without stenosis. Negative visible postcontrast ICA siphons and basilar artery. The non dominant right vertebral appears to functionally terminates in PICA. MRA HEAD FINDINGS Antegrade flow in the posterior circulation with dominant left vertebral artery supplying the basilar. The non dominant right vertebral is diminutive beyond the PICA. The left AICA appears dominant. No distal left vertebral or basilar stenosis, there is mild basilar irregularity. The vertebrobasilar junction is mildly fenestrated (normal variant). Patent AICA and SCA origins. Fetal type left PCA origin. There is mild to moderate right P1 irregularity and stenosis beyond the right PCA origin (series 1050, image 11). There is mild to moderate bilateral PCA branch  irregularity and stenosis with preserved distal flow signal. Antegrade flow in both ICA siphons. No siphon stenosis. Normal ophthalmic and left posterior communicating artery origins. Patent carotid termini. Patent MCA and ACA origins. The left A1 is mildly dominant. There is mild irregularity and stenosis at the left MCA origin. The right MCA origin is normal. Anterior communicating artery is normal. Mild right and mild-to-moderate left ACA A2 irregularity and stenosis (series 1046, image 7). The right M1 bifurcates early and is normal. There is mild right MCA branch irregularity. The left MCA also bifurcates early, and there is no proximal MCA stenosis but there is a severe stenosis of a left M2 branch best demonstrated on series 1046, image 5). IMPRESSION: 1. Neck MRA reveals mild extracranial atherosclerosis with no significant cervical carotid or vertebral artery stenosis. 2. Head MRA reveals moderate intracranial atherosclerosis. - up to moderate stenosis of the right PCA P1, both ACA A2 segments. - severe stenosis of a left MCA M2 branch. Electronically Signed   By: Genevie Ann M.D.   On: 10/04/2018 03:36   Mr Brain Wo Contrast (neuro Protocol)  Result Date: 10/04/2018 CLINICAL DATA:  78 year old male with altered mental status, aphasia. Age indeterminate right thalamic lacune on head CT earlier today. EXAM: MRI HEAD WITHOUT CONTRAST TECHNIQUE: Multiplanar,  multiecho pulse sequences of the brain and surrounding structures were obtained without intravenous contrast. COMPARISON:  Head CT 10/03/2018. Brain MRI and intracranial MRA 09/18/2017. FINDINGS: Brain: Small 6-7 millimeter focus of restricted diffusion in the left corona radiata on series 5, image 80. Background widespread cerebral white matter T2 and FLAIR hyperintensity. No associated hemorrhage or mass effect. No other No restricted diffusion or evidence of acute infarction. Expected evolution of the 2019 left basal ganglia infarct now with  encephalomalacia. Chronic right basal ganglia and right thalamic lacunar infarcts. Bilateral cerebral white matter T2 and FLAIR hyperintensity has not significantly changed. Comparatively mild T2 heterogeneity in the pons is stable. No midline shift, mass effect, evidence of mass lesion, ventriculomegaly, extra-axial collection or acute intracranial hemorrhage. Cervicomedullary junction and pituitary are within normal limits. 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 Mild hemosiderin associated with the left basal ganglia infarct. Vascular: Major intracranial vascular flow voids stable since 2019. Dominant left vertebral artery. Skull and upper cervical spine: Negative visible cervical spine. Visualized bone marrow signal is within normal limits. Sinuses/Orbits: Stable, negative. Other: Mastoids remain clear. Visible internal auditory structures appear normal. Scalp and face soft tissues appear stable. IMPRESSION: 1. Small acute white matter infarct in the left corona radiata. No associated hemorrhage or mass effect. 2. Underlying advanced chronic small vessel disease with expected evolution of the 2019 left basal ganglia infarct. Electronically Signed   By: Genevie Ann M.D.   On: 10/04/2018 00:21    PHYSICAL EXAM  Temp:  [98 F (36.7 C)-98.7 F (37.1 C)] 98 F (36.7 C) (08/12 1156) Pulse Rate:  [55-75] 60 (08/12 1156) Resp:  [13-23] 18 (08/12 1156) BP: (92-155)/(52-97) 153/74 (08/12 1156) SpO2:  [92 %-98 %] 98 % (08/12 1156) Weight:  [98.9 kg] 98.9 kg (08/11 1623)  General - Well nourished, well developed, in no apparent distress.  Ophthalmologic - fundi not visualized due to noncooperation.  Cardiovascular - Regular rate and rhythm.  Mental Status -  Level of arousal and orientation to time, place, and person were intact. Language including expression, naming, repetition, comprehension was assessed and found intact. Slight dysarthria Fund  of Knowledge was assessed and was impaired with current and past presidents.  Cranial Nerves II - XII - II - Visual field intact OU. III, IV, VI - Extraocular movements intact. V - Facial sensation intact bilaterally. VII - Facial movement intact bilaterally. VIII - Hearing & vestibular intact bilaterally. X - Palate elevates symmetrically. XI - Chin turning & shoulder shrug intact bilaterally. XII - Tongue protrusion intact.  Motor Strength - The patient's strength was normal in all extremities and pronator drift was absent.  Bulk was normal and fasciculations were absent.   Motor Tone - Muscle tone was assessed at the neck and appendages and was normal.  Reflexes - The patient's reflexes were symmetrical in all extremities and he had no pathological reflexes.  Sensory - Light touch, temperature/pinprick were assessed and were symmetrical.    Coordination - The patient had normal movements in the hands with no ataxia or dysmetria.  Tremor was absent.  Gait and Station - deferred.   ASSESSMENT/PLAN Justin [name] is a 77 y.o. male with history of hypertension, hyperlipidemia, diabetes, prostate cancer, TBI, OSA on CPAP and stroke 1 year ago admitted for confusion, slurred speech. No tPA given due to outside window.    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  Chronic left basal ganglia encephalomalacia  MRA head and neck mild to moderate stenosis of right P1, bilateral A2 and high-grade left M2 stenosis  2D Echo EF 60 to 65%  LDL 48  HgbA1c 6.9  Lovenox for VTE prophylaxis  clopidogrel 75 mg daily prior to admission, now on aspirin 325 mg daily and clopidogrel 75 mg daily.  Continue DAPT for 3 months and then Plavix alone given left M2 high-grade stenosis  Patient counseled to be compliant with his antithrombotic medications  Ongoing aggressive stroke risk factor management  Therapy  recommendations: Pending  Disposition: Pending  History of stroke  08/2017 admitted for right arm weakness.  MRI showed left BG infarct.  MRA showed left P1 high-grade stenosis with robust Pcom.  Carotid Doppler negative.  EF 60 to 65%.  LDL 115 and A1c 7.7.  Patient discharged with aspirin 325 and Plavix 75 and Lipitor 80.  Diabetes  HgbA1c 6.9 goal < 7.0  Controlled  CBG monitoring  SSI  DM education and close PCP follow up  Hypertension . Stable . Permissive hypertension (OK if <220/120) for 24-48 hours post stroke and then gradually normalized within 3-5 days.  Long term BP goal normotensive  Hyperlipidemia  Home meds: Lipitor 80 and fenofibrate  LDL 48, goal < 70  Now on Lipitor 80 and fenofibrate  Continue statin and fenofibrate at discharge  Other Stroke Risk Factors  Advanced age  Obstructive sleep apnea, on CPAP at home  Other [facility] day # 0  Neurology will sign off. Please call with questions. Pt will follow up with stroke clinic NP at [facility] in about 4 weeks. Thanks for the consult.   Rosalin Hawking, MD PhD Stroke Neurology 10/04/2018 2:06 PM    To contact Stroke Continuity provider, please refer to [email or web address] After hours, contact General Neurology

## 2018-10-04 NOTE — Telephone Encounter (Signed)
I called patient today to confirm his 8/13 appointment. I spoke with pt wife who states that pt was admitted to the hospital yesterday and is believed to have had another stroke. Pt wife that she has not received a call from a doctor or nurse from hospital since admission. I advised her that I would cancel the appt at [facility] tomorrow and I requested that she call back once patient has returned home or if she needs any assistance from our office. FYI

## 2018-10-04 NOTE — TOC Transition Note (Addendum)
Transition of Care [facility]) - CM/SW Discharge Note   Patient Details  Name: Justin [name] MRN: [id number] Date of Birth: 1940-09-25  Transition of Care [facility]) CM/SW Contact:  Pollie Friar, RN Phone Number: 10/04/2018, 4:34 PM   Clinical Narrative:    Pt discharging home. Wife states they will get a walker from the church.  Tiffany with [facility] accepted [facility] referral. Wife providing transport home.  Addendum: ST added to [facility] and Tiffany aware.    Final next level of care: Home w Home Health Services Barriers to Discharge: No Barriers Identified   Patient Goals and CMS Choice   CMS Medicare.gov Compare Post Acute Care list provided to:: Patient Represenative (must comment) Choice offered to / list presented to : Spouse  Discharge Placement                       Discharge Plan and Services   Discharge Planning Services: CM Consult Post Acute Care Choice: Home Health, Durable Medical Equipment                    HH Arranged: PT, OT [place] Agency: [facility] Date [place]: 10/04/18   Representative spoke with at [place]: [place] ([place]) Interventions     Readmission Risk Interventions No flowsheet data found.

## 2018-10-04 NOTE — Progress Notes (Signed)
SLP Cancellation Note  Patient Details Name: Justin [name] MRN: [id number] DOB: [birth year]   Cancelled treatment:          Pt passed [facility] swallow screen and nursing as well as family have denied observance of symptoms of oropharyngeal dysphagia; therefore, no formalized SLP swallow eval is needed per protocol.     Casimer Russett I. [place] Negus, [place], [place] Office number [phone] Pager [place] 10/04/2018, 5:51 PM

## 2018-10-04 NOTE — TOC Initial Note (Signed)
Transition of Care [facility]) - Initial/Assessment Note    Patient Details  Name: Justin [name] MRN: [id number] Date of Birth: Dec 31, 1940  [facility]) CM/SW Contact:    Pollie Friar, RN Phone Number: 10/04/2018, 4:33 PM  Clinical Narrative:                 Pt verifies medication noncompliance and plans to do better after d/c. Wife is able to provide needed transportation.  Expected Discharge Plan: [place] Barriers to Discharge: No Barriers Identified   Patient Goals and CMS Choice   CMS Medicare.gov Compare Post Acute Care list provided to:: Patient Represenative (must comment) Choice offered to / list presented to : Spouse  Expected Discharge Plan and Services Expected Discharge Plan: [place]   Discharge Planning Services: CM Consult Post Acute Care Choice: Home Health, Durable Medical Equipment Living arrangements for the past 2 months: Single Family Home Expected Discharge Date: 10/04/18                         HH Arranged: PT, OT [place] [facility] [employer] Date [place]: 10/04/18   Representative spoke with at [place]: [place] Arrangements/Services Living arrangements for the past 2 months: [place] with:: Spouse Patient language and need for interpreter reviewed:: Yes Do you feel safe going back to the place where you live?: Yes      Need for Family Participation in Patient Care: Yes (Comment) Care giver support system in place?: Yes (comment)(wife able to provide needed supervision) Current home services: DME(handicap shower) Criminal Activity/Legal Involvement Pertinent to Current Situation/Hospitalization: No - Comment as needed  Activities of Daily Living Home Assistive Devices/Equipment: None ADL Screening (condition at time of admission) Patient's cognitive ability adequate to safely complete daily activities?: No Is the patient  deaf or have difficulty hearing?: Yes Does the patient have difficulty seeing, even when wearing glasses/contacts?: No Does the patient have difficulty concentrating, remembering, or making decisions?: Yes Patient able to express need for assistance with ADLs?: Yes Does the patient have difficulty dressing or bathing?: No Independently performs ADLs?: Yes (appropriate for developmental age) Does the patient have difficulty walking or climbing stairs?: Yes Weakness of Legs: Both Weakness of Arms/Hands: None  Permission Sought/Granted                  Emotional Assessment Appearance:: Appears stated age Attitude/Demeanor/Rapport: Engaged Affect (typically observed): Accepting Orientation: : Oriented to Self, Oriented to Place, Oriented to  Time, Oriented to Situation   Psych Involvement: No (comment)  Admission diagnosis:  Acute CVA (cerebrovascular accident) [facility]) [I63.9] Patient Active Problem List   Diagnosis Date Noted  . Type 2 diabetes mellitus ([place]) 10/04/2018  . Depression 10/04/2018  . Acute CVA (cerebrovascular accident) ([place]) 09/18/2017  . Stroke (cerebrum) ([place]) 09/17/2017  . Diabetes mellitus without complication ([place]) 95/62/1308  . Closed head injury 04/22/2013  . HLD (hyperlipidemia) 07/29/2009  . Essential hypertension 07/29/2009  . EFFUSION, PLEURAL 07/29/2009  . DIVERTICULITIS, COLON 07/29/2009  . 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 COLONIC POLYPS, HYPERPLASTIC, HX OF 07/29/2009   PCP:  Chesley Noon, MD Pharmacy:   [id number] [place], [place] - [address] [place] [address] Phone: [phone] Fax: [place]  [address] [place] [address] Phone: [phone] Fax: [phone]  [facility] [place], [place] [address] Phone: [phone] Fax: [phone]     Social Determinants of Health (SDOH) Interventions    Readmission Risk Interventions No flowsheet data found.

## 2018-10-05 ENCOUNTER — Ambulatory Visit: Payer: Medicare HMO | Admitting: Adult Health

## 2018-10-19 ENCOUNTER — Other Ambulatory Visit: Payer: Self-pay | Admitting: *Deleted

## 2018-10-19 MED ORDER — CITALOPRAM HYDROBROMIDE 10 MG PO TABS: 10.0000 mg | ORAL_TABLET | Freq: Every day | ORAL | 0 refills | Status: DC

## 2018-11-07 ENCOUNTER — Encounter: Payer: Self-pay | Admitting: Adult Health

## 2018-11-07 ENCOUNTER — Other Ambulatory Visit: Payer: Self-pay

## 2018-11-07 ENCOUNTER — Ambulatory Visit (INDEPENDENT_AMBULATORY_CARE_PROVIDER_SITE_OTHER): Payer: Medicare HMO | Admitting: Adult Health

## 2018-11-07 VITALS — BP 139/73 | HR 60 | Temp 97.8°F | Ht 72.0 in | Wt 224.8 lb

## 2018-11-07 DIAGNOSIS — G4733 Obstructive sleep apnea (adult) (pediatric): Secondary | ICD-10-CM

## 2018-11-07 DIAGNOSIS — I10 Essential (primary) hypertension: Secondary | ICD-10-CM | POA: Diagnosis not present

## 2018-11-07 DIAGNOSIS — I63512 Cerebral infarction due to unspecified occlusion or stenosis of left middle cerebral artery: Secondary | ICD-10-CM

## 2018-11-07 DIAGNOSIS — Z9989 Dependence on other enabling machines and devices: Secondary | ICD-10-CM

## 2018-11-07 DIAGNOSIS — I69319 Unspecified symptoms and signs involving cognitive functions following cerebral infarction: Secondary | ICD-10-CM

## 2018-11-07 DIAGNOSIS — E119 Type 2 diabetes mellitus without complications: Secondary | ICD-10-CM

## 2018-11-07 DIAGNOSIS — E785 Hyperlipidemia, unspecified: Secondary | ICD-10-CM | POA: Diagnosis not present

## 2018-11-07 NOTE — Progress Notes (Signed)
[facility] [address] [place]. [place] [address] [phone]       [place]  [name] Date of Birth:  26-Jan-1941 Medical Record Number:  [id number]   Reason for Referral:  hospital stroke follow up    CHIEF COMPLAINT:  Chief Complaint  Patient presents with  . Hospitalization Follow-up    Stroke f/u. Wife present. Rm 9. No new concerns at this time.     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  History obtained from patient, wife and chart review. Reviewed all radiology images and labs personally.  [name] is a 78 y.o. male with history of hypertension, hyperlipidemia, diabetes, prostate cancer, TBI, OSA on CPAP and stroke 1 year ago  presented to [facility] ED on 10/03/2018 for confusion, slurred speech.  Dr. Erlinda Hong and stroke team consulted with stroke work-up showing left CR infarct as evidenced on MRI likely due to small vessel disease source with resultant slight dysarthria.  No tPA given due to outside window.  MRI also showed evidence of chronic left basal ganglia encephalomalacia.  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  2D echo unremarkable.  LDL 48 and A1c 6.9.  Recommended DAPT for 3 months and Plavix alone given left M2 high-grade stenosis.  Previously on Plavix but admitted noncompliance for several days prior.  HTN stable.  Controlled DM with satisfactory A1c.  HLD stable.  Other stroke risk factors include advanced age and OSA on CPAP (new diagnosis in 02/2018).  Discharged home in stable condition with recommendation of outpatient therapies.  Justin [name] is being seen today for stroke follow-up.  He was last seen in this office for stroke follow-up on 04/06/2018 for follow-up regarding prior stroke with residual cognitive deficits.  Since recent stroke, residual  deficits of ongoing cognitive deficits but wife denies worsening. No residual speech difficulties.  He has also been having ongoing difficulties with ambulation and dragging of right foot which has been present since prior stroke.  Home Speech therapy will be completed at the end of this week. He will also be completing home PT/OT soon. Per wife, noncompliance of medications for 2 months prior to recent stroke.  He does endorse compliance with current medication regimen.  He continues on aspirin 325mg  daily and Plavix without bleeding or bruising. Continues on atorvastatin without myalgias. .  Blood pressure today 139/73.  He subjectively endorses compliance with CPAP for OSA management but wife states he has not been great with ongoing compliance.  He is initially having difficulties with worsening mood and irritability but PCP increased citalopram dose from 10 mg to 20 mg with improvement.  No further concerns at this time.   ROS:   14 system review of systems performed and negative with exception of weakness, memory loss, gait difficulty  PMH:  Past Medical History:  Diagnosis Date  . Brain bleed ([place])   . Cancer [facility])    prostate  . Depression   . Diabetes mellitus without complication ([place])   . High cholesterol   . Hypertension   . Stroke [facility]     PSH:  Past Surgical History:  Procedure Laterality Date  . arm & hand surgery Left 1972   skill saw accident  . COLON SURGERY     partial  . EYE SURGERY  lens implant and cateract- Left  . PROSTATECTOMY      Social History:  Social History   Socioeconomic History  . Marital status: Married    Spouse name: Not on file  . Number of children: 1  . Years of education: Not on file  . Highest education level: High school graduate  Occupational History  . Not on file  Social Needs  . Financial resource strain: Not on file  . Food insecurity    Worry: Not on file    Inability: Not on file  . Transportation needs    Medical:  Not on file    Non-medical: Not on file  Tobacco Use  . Smoking status: Never Smoker  . Smokeless tobacco: Never Used  Substance and Sexual Activity  . Alcohol use: Never    Frequency: Never  . Drug use: Never  . Sexual activity: Not on file  Lifestyle  . Physical activity    Days per week: Not on file    Minutes per session: Not on file  . Stress: Not on file  Relationships  . Social Herbalist on phone: Not on file    Gets together: Not on file    Attends religious service: Not on file    Active member of club or organization: Not on file    Attends meetings of clubs or organizations: Not on file    Relationship status: Not on file  . Intimate partner violence    Fear of current or ex partner: Not on file    Emotionally abused: Not on file    Physically abused: Not on file    Forced sexual activity: Not on file  Other Topics Concern  . Not on file  Social History Narrative   Lives at home with his wife   Right handed   Caffeine: soft drinks 2-3 cups daily    Family History:  Family History  Problem Relation Age of Onset  . CAD Mother   . Diabetes Mother   . Emphysema Father   . Diabetes Sister   . Diabetes Brother   . Diabetes Sister   . Diabetes Brother   . Diabetes Mellitus II Neg Hx     Medications:   Current Outpatient Medications on File Prior to Visit  Medication Sig Dispense Refill  . aspirin EC 325 MG EC tablet Take 1 tablet (325 mg total) by mouth daily. 30 tablet 0  . atorvastatin (LIPITOR) 80 MG tablet Take 1 tablet (80 mg total) by mouth daily. 90 tablet 4  . Cholecalciferol (VITAMIN D-1000 MAX ST) 1000 units tablet Take 1,000 Units by mouth daily.    . citalopram (CELEXA) 10 MG tablet Take 1 tablet (10 mg total) by mouth daily. 90 tablet 0  . clopidogrel (PLAVIX) 75 MG tablet Take 1 tablet (75 mg total) by mouth daily. 90 tablet 4  . cyanocobalamin ([employer] VITAMIN B12) 1000 MCG tablet Take 1,000 mcg by mouth daily.     . furosemide  (LASIX) 20 MG tablet Take 20 mg by mouth daily.     Marland Kitchen glipiZIDE (GLUCOTROL) 10 MG tablet Take 10 mg by mouth 2 (two) times daily before a meal.    . losartan (COZAAR) 100 MG tablet Take 100 mg by mouth daily.    . metFORMIN (GLUCOPHAGE) 500 MG tablet Take 1,000 mg by mouth 2 (two) times daily with a meal.    . mirabegron ER (MYRBETRIQ) 50 MG TB24 tablet Take 50 mg by  mouth daily.    . Omega-3 Fatty Acids (FISH OIL) 1000 MG CAPS Take 1,000 mg by mouth daily with breakfast.    . polyvinyl alcohol (LIQUIFILM TEARS) 1.4 % ophthalmic solution Place 1 drop into both eyes 4 (four) times daily as needed for dry eyes.    . Semaglutide (OZEMPIC) 0.25 or 0.5 MG/DOSE SOPN Inject 0.25 mg into the skin once a week.    . fenofibrate 160 MG tablet Take 160 mg by mouth daily.     No current facility-administered medications on file prior to visit.     Allergies:  No Known Allergies   Physical Exam  Vitals:   11/07/18 1303  BP: 139/73  Pulse: 60  Temp: 97.8 F (36.6 C)  TempSrc: Oral  Weight: 224 lb 12.8 oz (102 kg)  Height: 6' (1.829 m)   Body mass index is 30.49 kg/m. No exam data present  General: well developed, well nourished,  pleasant elderly Caucasian male, seated, in no evident distress Head: head normocephalic and atraumatic.   Neck: supple with no carotid or supraclavicular bruits Cardiovascular: regular rate and rhythm, no murmurs Musculoskeletal: no deformity Skin:  no rash/petichiae Vascular:  Normal pulses all extremities   Neurologic Exam Mental Status: Awake and fully alert. Oriented to time but disoriented to location. Recent and remote memory diminished. Attention span, concentration and fund of knowledge diminished. Mood and affect appropriate.  Cranial Nerves: Fundoscopic exam reveals sharp disc margins. Pupils equal, briskly reactive to light. Extraocular movements full without nystagmus. Visual fields full to confrontation. Hearing intact. Facial sensation intact. Face,  tongue, palate moves normally and symmetrically.  Motor: Normal bulk and tone. Normal strength in all tested extremity muscles. Sensory.: intact to touch , pinprick , position and vibratory sensation.  Coordination: Rapid alternating movements normal in all extremities. Finger-to-nose performed accurately bilaterally and heel-to-shin difficulty performing but overall accurate. Gait and Station: Arises from chair without difficulty. Stance is normal. Gait demonstrates  abnormal gait with shuffling of right foot and increased difficulty with turns but able to ambulate without assistive device Reflexes: 1+ and symmetric. Toes downgoing.     NIHSS  0 Modified Rankin  2   Diagnostic Data (Labs, Imaging, Testing)   MRI left CR infarct.  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 year old male presented with slurred speech and confusion on 10/03/2018 with stroke work-up revealing left CR infarct secondary to small vessel disease source. Vascular risk factors include prior stroke, HTN, HLD, DM, OSA on CPAP and recent medication noncompliance.  He recovered well from recent stroke but has ongoing cognitive deficits and gait abnormality from prior stroke    PLAN:  1. Left CR infarct: Continue aspirin 81 mg daily and clopidogrel 75 mg daily  and atorvastatin for secondary stroke prevention.  Recommend hospital discharge 37-month DAPT therefore will continue for additional 2 months and then discontinue aspirin continue Plavix alone.  Maintain strict control of hypertension with blood pressure goal below 130/90, diabetes with hemoglobin A1c goal below 6.5% and cholesterol with LDL cholesterol (bad cholesterol) goal below 70 mg/dL.  I also advised the patient to eat a healthy diet with plenty of whole grains,  cereals, fruits and vegetables, exercise regularly with at least 30 minutes of continuous activity daily  and maintain ideal body weight. 2. Cognitive deficit: Continue home speech therapy with potential of extending sessions.  Discussion regarding outpatient therapy but patient would like to hold off at this time.  Discussion regarding importance of performing exercises at home as recommended by therapy 3. Gait abnormality: No evidence of weakness but does have dragging of right foot.  Discussion with wife potential use of assistive device for fall prevention and she will discuss this further with therapy 4. HTN: Advised to continue current treatment regimen.  Today's BP 139/73.  Advised to continue to monitor at home along with continued follow-up with PCP for management 5. HLD: Advised to continue current treatment regimen along with continued follow-up with PCP for future prescribing and monitoring of lipid panel 6. DMII: Advised to continue to monitor glucose levels at home along with continued follow-up with PCP for management and monitoring 7. OSA on CPAP: Discussion regarding importance of compliance for overall health and insurance coverage    Follow up in 3 months for stroke follow-up and OSA compliance follow-up or call earlier if needed   Greater than 50% of time during this 45 minute visit was spent on counseling, explanation of diagnosis of left CR infarct, reviewing risk factor management of prior stroke, HTN, HLD, DM and OSA on CPAP, discussion regarding importance of medication compliance, planning of further management along with potential future management, and discussion with patient and family answering all questions.    Frann Rider, AGNP-BC  [facility] [address] [place], [place] [address]  Phone [phone] Fax [phone] Note: This document was prepared with digital dictation and possible smart phrase technology. Any  transcriptional errors that result from this process are unintentional.

## 2018-11-07 NOTE — Patient Instructions (Addendum)
Continue clopidogrel 75 mg daily and aspirin  and lipitor/fenofibrate  for secondary stroke prevention  Continue to follow up with PCP regarding cholesterol, blood pressure and diabetes management   Continue to participate in home health therapy with potentially increasing number of visits.  Please let me know if interested in outpatient therapy.  Increase compliance with CPAP for OSA management  Continue to monitor blood pressure at home  Maintain strict control of hypertension with blood pressure goal below 130/90, diabetes with hemoglobin A1c goal below 6.5% and cholesterol with LDL cholesterol (bad cholesterol) goal below 70 mg/dL. I also advised the patient to eat a healthy diet with plenty of whole grains, cereals, fruits and vegetables, exercise regularly and maintain ideal body weight.  Followup in the future with me in 3 months for stroke and CPAP follow up or call earlier if needed       Thank you for coming to see [place] at [facility]. I hope we have been able to provide you high quality care today.  You may receive a patient satisfaction survey over the next few weeks. We would appreciate your feedback and comments so that we may continue to improve ourselves and the health of our patients.

## 2018-11-08 NOTE — Progress Notes (Signed)
I agree with the above plan 

## 2018-11-28 ENCOUNTER — Other Ambulatory Visit: Payer: Self-pay | Admitting: Adult Health

## 2018-12-08 ENCOUNTER — Other Ambulatory Visit: Payer: Self-pay | Admitting: Adult Health

## 2018-12-29 ENCOUNTER — Other Ambulatory Visit: Payer: Self-pay | Admitting: Adult Health

## 2019-02-07 ENCOUNTER — Other Ambulatory Visit: Payer: Self-pay

## 2019-02-07 ENCOUNTER — Encounter: Payer: Self-pay | Admitting: Adult Health

## 2019-02-07 ENCOUNTER — Ambulatory Visit (INDEPENDENT_AMBULATORY_CARE_PROVIDER_SITE_OTHER): Payer: Medicare HMO | Admitting: Adult Health

## 2019-02-07 VITALS — BP 158/78 | HR 82 | Temp 95.0°F | Ht 72.0 in | Wt 238.6 lb

## 2019-02-07 DIAGNOSIS — F329 Major depressive disorder, single episode, unspecified: Secondary | ICD-10-CM

## 2019-02-07 DIAGNOSIS — R4189 Other symptoms and signs involving cognitive functions and awareness: Secondary | ICD-10-CM

## 2019-02-07 DIAGNOSIS — Z9989 Dependence on other enabling machines and devices: Secondary | ICD-10-CM

## 2019-02-07 DIAGNOSIS — G4733 Obstructive sleep apnea (adult) (pediatric): Secondary | ICD-10-CM

## 2019-02-07 DIAGNOSIS — R4689 Other symptoms and signs involving appearance and behavior: Secondary | ICD-10-CM

## 2019-02-07 DIAGNOSIS — E119 Type 2 diabetes mellitus without complications: Secondary | ICD-10-CM

## 2019-02-07 DIAGNOSIS — E785 Hyperlipidemia, unspecified: Secondary | ICD-10-CM | POA: Diagnosis not present

## 2019-02-07 DIAGNOSIS — Z8673 Personal history of transient ischemic attack (TIA), and cerebral infarction without residual deficits: Secondary | ICD-10-CM | POA: Diagnosis not present

## 2019-02-07 DIAGNOSIS — I10 Essential (primary) hypertension: Secondary | ICD-10-CM | POA: Diagnosis not present

## 2019-02-07 MED ORDER — SERTRALINE HCL 25 MG PO TABS: 25.0000 mg | ORAL_TABLET | Freq: Every day | ORAL | 2 refills | Status: DC

## 2019-02-07 MED ORDER — CITALOPRAM HYDROBROMIDE 10 MG PO TABS: 5.0000 mg | ORAL_TABLET | Freq: Every day | ORAL | 0 refills | Status: DC

## 2019-02-07 NOTE — Patient Instructions (Addendum)
Initiate Zoloft 25 mg daily for ongoing depression.  Upon starting Zoloft, decrease Celexa dose to half a tab (10 mg) daily for 1 week and then discontinue.  Continue Zoloft dose for 3 to 4 weeks and please call our office for follow-up with PCP if increase is needed  Continue clopidogrel 75 mg daily  and Lipitor for secondary stroke prevention  Discontinue aspirin  Continue to follow up with PCP regarding cholesterol, blood pressure and diabetes management   Ongoing use of CPAP for sleep apnea management  Continue to monitor blood pressure at home  Maintain strict control of hypertension with blood pressure goal below 130/90, diabetes with hemoglobin A1c goal below 6.5% and cholesterol with LDL cholesterol (bad cholesterol) goal below 70 mg/dL. I also advised the patient to eat a healthy diet with plenty of whole grains, cereals, fruits and vegetables, exercise regularly and maintain ideal body weight.  Followup in the future with me in 6 months or call earlier if needed       Thank you for coming to see [place] at [facility]. I hope we have been able to provide you high quality care today.  You may receive a patient satisfaction survey over the next few weeks. We would appreciate your feedback and comments so that we may continue to improve ourselves and the health of our patients.

## 2019-02-07 NOTE — Progress Notes (Addendum)
[facility] [address] [place]. [place] [address] [phone]       OFFICE FOLLOW UP NOTE  [name] Date of Birth:  07-23-1940 Medical Record Number:  [id number]   [place] stroke provider: Dr. Arvin Collard [name] Reason for Referral: stroke follow up and CPAP compliance visit    CHIEF COMPLAINT:  Chief Complaint  Patient presents with  . Follow-up    TxRM, with wife. Unsure if he needs to stop the aspirin or plavix. States still taking both because they are unsure.CPAP is working well.    HPI: Stroke admission 10/03/2018: Justin [name] is a 78 y.o. male with history of hypertension, hyperlipidemia, diabetes, prostate cancer, TBI, OSA on CPAP and stroke 1 year ago  presented to [facility] ED on 10/03/2018 for confusion, slurred speech.  Dr. Erlinda Hong and stroke team consulted with stroke work-up showing left CR infarct as evidenced on MRI likely due to small vessel disease source with resultant slight dysarthria.  No tPA given due to outside window.  MRI also showed evidence of chronic left basal ganglia encephalomalacia.  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  2D echo unremarkable.  LDL 48 and A1c 6.9.  Recommended DAPT for 3 months and Plavix alone given left M2 high-grade stenosis.  Previously on Plavix but admitted noncompliance for several days prior.  HTN stable.  Controlled DM with satisfactory A1c.  HLD stable.  Other stroke risk factors include advanced age and OSA on CPAP (new diagnosis in 02/2018).  Discharged home in stable condition with recommendation of outpatient therapies.  Initial visit 11/07/2018: Justin [name] is being seen today for stroke follow-up.  He was last seen in this office for stroke follow-up on 04/06/2018 for follow-up regarding prior stroke with residual cognitive deficits.  Since recent stroke, residual deficits of ongoing cognitive deficits but wife denies worsening.  No residual speech difficulties.  He has also been having ongoing difficulties with ambulation and dragging of right foot which has been present since prior stroke.  Home Speech therapy will be completed at the end of this week. He will also be completing home PT/OT soon. Per wife, noncompliance of medications for 2 months prior to recent stroke.  He does endorse compliance with current medication regimen.  He continues on aspirin 325mg  daily and Plavix without bleeding or bruising. Continues on atorvastatin without myalgias. .  Blood pressure today 139/73.  He subjectively endorses compliance with CPAP for OSA management but wife states he has not been great with ongoing compliance.  He is initially having difficulties with worsening mood and irritability but PCP increased citalopram dose from 10 mg to 20 mg with improvement.  No further concerns at this time.  Update 02/07/2019: Justin [name] is a 78 year old male who is being seen today for stroke and CPAP follow-up. Stroke - residual deficits include cognitive impairment which has been stable without worsening.  MMSE today 21/30.  Wife does endorse increased emotional outbursts or irritability/agitation over the past couple months.  He is currently on Celexa 10 mg daily initially working well for her overall mood and irritability but wife is questioning possible change in therapy.  Continues on Plavix and aspirin despite 3 months recommendation without bleeding or bruising.  Continues on atorvastatin without myalgias.  Blood pressure today 158/78 but does monitor at home and typically lower.  Denies new or worsening stroke/TIA symptoms.  OSA on CPAP -compliance report from  01/07/2019 -02/05/2019 shows 27 out of 30 usage days for 90% compliance and 76.7% of days with usage greater than 4 hours with average usage 5 hours and 28 minutes.  Residual AHI 2.3 on a set pressure of 11 cm H2O with EPR level 2.  He has been tolerating CPAP well without difficulty.   Continues to receive supplies and follow-up with aero care as needed.  He does endorse improvement of overall fatigue and energy level.      ROS:   14 system review of systems performed and negative with exception of memory loss, depression and anxiety  PMH:  Past Medical History:  Diagnosis Date  . Brain bleed ([place])   . Cancer [facility])    prostate  . Depression   . Diabetes mellitus without complication ([place])   . High cholesterol   . Hypertension   . Stroke [facility])     PSH:  Past Surgical History:  Procedure Laterality Date  . arm & hand surgery Left 1972   skill saw accident  . COLON SURGERY     partial  . EYE SURGERY     lens implant and cateract- Left  . PROSTATECTOMY      Social History:  Social History   Socioeconomic History  . Marital status: Married    Spouse name: Not on file  . Number of children: 1  . Years of education: Not on file  . Highest education level: High school graduate  Occupational History  . Not on file  Tobacco Use  . Smoking status: Never Smoker  . Smokeless tobacco: Never Used  Substance and Sexual Activity  . Alcohol use: Never  . Drug use: Never  . Sexual activity: Not on file  Other Topics Concern  . Not on file  Social History Narrative   Lives at home with his wife   Right handed   Caffeine: soft drinks 2-3 cups daily   Social Determinants of Health   Financial Resource Strain:   . Difficulty of Paying Living Expenses: Not on file  Food Insecurity:   . Worried About Charity fundraiser in the Last Year: Not on file  . Ran Out of Food in the Last Year: Not on file  Transportation Needs:   . Lack of Transportation (Medical): Not on file  . Lack of Transportation (Non-Medical): Not on file  Physical Activity:   . Days of Exercise per Week: Not on file  . Minutes of Exercise per Session: Not on file  Stress:   . Feeling of Stress : Not on file  Social Connections:   . Frequency of Communication with Friends and Family:  Not on file  . Frequency of Social Gatherings with Friends and Family: Not on file  . Attends Religious Services: Not on file  . Active Member of Clubs or Organizations: Not on file  . Attends Archivist Meetings: Not on file  . Marital Status: Not on file  Intimate Partner Violence:   . Fear of Current or Ex-Partner: Not on file  . Emotionally Abused: Not on file  . Physically Abused: Not on file  . Sexually Abused: Not on file    Family History:  Family History  Problem Relation Age of Onset  . CAD Mother   . Diabetes Mother   . Emphysema Father   . Diabetes Sister   . Diabetes Brother   . Diabetes Sister   . Diabetes Brother   . Diabetes Mellitus II Neg Hx  Medications:   Current Outpatient Medications on File Prior to Visit  Medication Sig Dispense Refill  . atorvastatin (LIPITOR) 80 MG tablet TAKE 1 TABLET EVERY DAY 90 tablet 0  . Cholecalciferol (VITAMIN D-1000 MAX ST) 1000 units tablet Take 1,000 Units by mouth daily.    . clopidogrel (PLAVIX) 75 MG tablet Take 1 tablet (75 mg total) by mouth daily. 90 tablet 4  . cyanocobalamin ([facility] VITAMIN B12) 1000 MCG tablet Take 1,000 mcg by mouth daily.     Marland Kitchen glipiZIDE (GLUCOTROL) 10 MG tablet Take 10 mg by mouth 2 (two) times daily before a meal.    . losartan (COZAAR) 100 MG tablet Take 100 mg by mouth daily.    . metFORMIN (GLUCOPHAGE) 500 MG tablet Take 1,000 mg by mouth 2 (two) times daily with a meal.    . mirabegron ER (MYRBETRIQ) 50 MG TB24 tablet Take 50 mg by mouth daily.    . Omega-3 Fatty Acids (FISH OIL) 1000 MG CAPS Take 1,000 mg by mouth daily with breakfast.    . polyvinyl alcohol (LIQUIFILM TEARS) 1.4 % ophthalmic solution Place 1 drop into both eyes 4 (four) times daily as needed for dry eyes.    . Semaglutide (OZEMPIC) 0.25 or 0.5 MG/DOSE SOPN Inject 0.25 mg into the skin once a week.    . fenofibrate 160 MG tablet Take 160 mg by mouth daily.    . furosemide (LASIX) 20 MG tablet Take 20 mg by mouth  daily.      No current facility-administered medications on file prior to visit.    Allergies:  No Known Allergies   Physical Exam  Vitals:   02/07/19 1508  BP: (!) 158/78  Pulse: 82  Temp: (!) 95 F (35 C)  Weight: 238 lb 9.6 oz (108.2 kg)  Height: 6' (1.829 m)   Body mass index is 32.36 kg/m. No exam data present  General: well developed, well nourished,  pleasant elderly Caucasian male, seated, in no evident distress Head: head normocephalic and atraumatic.   Neck: supple with no carotid or supraclavicular bruits Cardiovascular: regular rate and rhythm, no murmurs Musculoskeletal: no deformity Skin:  no rash/petichiae Vascular:  Normal pulses all extremities   Neurologic Exam Mental Status: Awake and fully alert. Oriented to time but disoriented to location. Recent and remote memory diminished. Attention span, concentration and fund of knowledge diminished. Mood and affect appropriate.  MMSE - Mini Mental State Exam 02/07/2019  Orientation to time 3  Orientation to Place 4  Registration 3  Attention/ Calculation 1  Recall 1  Language- name 2 objects 2  Language- repeat 1  Language- follow 3 step command 3  Language- read & follow direction 1  Write a sentence 1  Copy design 1  Copy design-comments named 6 animals  Total score 21   Cranial Nerves: Pupils equal, briskly reactive to light. Extraocular movements full without nystagmus. Visual fields full to confrontation. Hearing intact. Facial sensation intact. Face, tongue, palate moves normally and symmetrically.  Motor: Normal bulk and tone. Normal strength in all tested extremity muscles. Sensory.: intact to touch , pinprick , position and vibratory sensation.  Coordination: Rapid alternating movements normal in all extremities. Finger-to-nose performed accurately bilaterally and heel-to-shin difficulty performing but overall accurate. Gait and Station: Arises from chair without difficulty. Stance is normal.  Gait demonstrates  abnormal gait with shuffling of right foot and increased difficulty with turns but able to ambulate without assistive device Reflexes: 1+ and symmetric. Toes downgoing.  ASSESSMENT: Justin [name] is a 78 y.o. year old male presented with slurred speech and confusion on 10/03/2018 with stroke work-up revealing left CR infarct secondary to small vessel disease source. Vascular risk factors include prior stroke, HTN, HLD, DM, OSA on CPAP and recent medication noncompliance.  Recovered well from recent stroke but continues to have residual deficits of cognitive impairment from prior stroke in 2019    PLAN:  1. Left CR infarct: Continue clopidogrel 75 mg daily  and atorvastatin for secondary stroke prevention.  Advised to discontinue aspirin at this time and continue Plavix alone as 91-month DAPT completed and no indication for prolonged therapy.  Maintain strict control of hypertension with blood pressure goal below 130/90, diabetes with hemoglobin A1c goal below 6.5% and cholesterol with LDL cholesterol (bad cholesterol) goal below 70 mg/dL.  I also advised the patient to eat a healthy diet with plenty of whole grains, cereals, fruits and vegetables, exercise regularly with at least 30 minutes of continuous activity daily and maintain ideal body weight. 2. Cognitive deficit: Cognitive deficit present with prior stroke in 08/2017 which has been stable without worsening 3. Depression/anxiety: Discussion regarding switching antidepressants from Celexa to Zoloft for possible improvement of emotional outbursts and irritability.  Advised to initiate Zoloft 25 mg daily and decrease Celexa dosage to half a tab daily (5 mg) for 1 week and then discontinue.  May consider increasing if needed after 3 to 4 weeks 4. HTN: Advised to continue current treatment regimen.  Advised to continue to monitor at home along with continued follow-up with PCP for management 5. HLD: Advised to continue  current treatment regimen along with continued follow-up with PCP for future prescribing and monitoring of lipid panel 6. DMII: Advised to continue to monitor glucose levels at home along with continued follow-up with PCP for management and monitoring 7. OSA on CPAP: CPAP compliance shows adequate treatment of sleep apnea with optimal residual AHI.  Discussion regarding importance of ongoing compliance for secondary stroke prevention and cardiovascular risk factors 8. Intracranial stenosis: Completed 3 months DAPT and continues on Plavix alone along with ongoing use of statin.  Discussion regarding importance of risk factor management and ongoing follow-up with PCP.     Follow-up in 6 months or call earlier if needed   Greater than 50% of time during this 25 minute visit was spent on counseling, explanation of diagnosis of left CR infarct, reviewing CPAP compliance data and discussing ongoing use of CPAP, reviewing risk factor management of prior stroke, HTN, HLD, DM and OSA on CPAP, discussion regarding importance of medication compliance, planning of further management along with potential future management, and discussion with patient and family answering all questions.    Frann Rider, AGNP-BC  [facility] [address] [place], [place] [address]  Phone [phone] Fax [phone] Note: This document was prepared with digital dictation and possible smart phrase technology. Any transcriptional errors that result from this process are unintentional.    I reviewed the above note and documentation by the Nurse Practitioner and agree with the history, exam, assessment and plan as outlined above. I was available for consultation. Star Age, MD, PhD [facility])

## 2019-02-08 NOTE — Progress Notes (Signed)
I agree with the above plan 

## 2019-04-10 ENCOUNTER — Telehealth: Payer: Self-pay

## 2019-04-10 MED ORDER — SERTRALINE HCL 25 MG PO TABS: 25.0000 mg | ORAL_TABLET | Freq: Every day | ORAL | 1 refills | Status: DC

## 2019-04-10 NOTE — Telephone Encounter (Signed)
Received a fax from [facility] where the patient is requesting a refill on sertraline (ZOLOFT) 25 MG tablet.

## 2019-04-23 ENCOUNTER — Telehealth: Payer: Self-pay | Admitting: Adult Health

## 2019-04-23 NOTE — Telephone Encounter (Signed)
I called home and could not LM, I call mobile wife, and could not LM. (n/a).

## 2019-04-23 NOTE — Telephone Encounter (Signed)
[name] called wanting to know if she can discuss the pt's memory with the RN or Provider. Please advise.

## 2019-04-24 NOTE — Telephone Encounter (Signed)
Wireless caller not available mobile.  Mailbox full (cannot LM on home #.

## 2019-05-09 ENCOUNTER — Telehealth: Payer: Self-pay | Admitting: Adult Health

## 2019-05-09 NOTE — Telephone Encounter (Signed)
I called wife and LMVM for her to return call.  Who has HCPOA and POA?

## 2019-05-09 NOTE — Telephone Encounter (Signed)
[name] Justin [name], Justin K) has called to request a letter re: pt's competency be faxed to the attention of [name](office manager)this is the office of [name] [phone] ph [phone]

## 2019-05-10 NOTE — Telephone Encounter (Signed)
We are able to provide a letter stating post stroke cognitive impairment but unable to deem incompetent to make decisions as this will need to be done through psychiatry.

## 2019-05-10 NOTE — Telephone Encounter (Signed)
Wife called back. Both she and pt were at lawyers office to update and she asked question about living will (limiting of funds in account).  Due to his medical hx strokes ahd some ST memory loss, the attorney needed to have a letter of competency for the patient.  Please advise.  Thanks

## 2019-05-10 NOTE — Telephone Encounter (Signed)
Any psych provider can deem competency

## 2019-05-14 NOTE — Telephone Encounter (Signed)
I called wife/pt LMVM for her JM/NP note relating to competency.  She is to call back if questions.

## 2019-06-24 ENCOUNTER — Emergency Department (HOSPITAL_COMMUNITY): Payer: No Typology Code available for payment source

## 2019-06-24 ENCOUNTER — Emergency Department (HOSPITAL_BASED_OUTPATIENT_CLINIC_OR_DEPARTMENT_OTHER): Payer: No Typology Code available for payment source

## 2019-06-24 ENCOUNTER — Encounter (HOSPITAL_BASED_OUTPATIENT_CLINIC_OR_DEPARTMENT_OTHER): Payer: Self-pay | Admitting: Emergency Medicine

## 2019-06-24 ENCOUNTER — Inpatient Hospital Stay (HOSPITAL_COMMUNITY): Payer: No Typology Code available for payment source

## 2019-06-24 ENCOUNTER — Other Ambulatory Visit: Payer: Self-pay

## 2019-06-24 ENCOUNTER — Inpatient Hospital Stay (HOSPITAL_BASED_OUTPATIENT_CLINIC_OR_DEPARTMENT_OTHER)
Admission: EM | Admit: 2019-06-24 | Discharge: 2019-06-28 | DRG: 065 | Disposition: A | Discharge status: Skilled Nursing Facility | Payer: No Typology Code available for payment source | Attending: Internal Medicine | Admitting: Internal Medicine

## 2019-06-24 DIAGNOSIS — Z8673 Personal history of transient ischemic attack (TIA), and cerebral infarction without residual deficits: Secondary | ICD-10-CM

## 2019-06-24 DIAGNOSIS — I6389 Other cerebral infarction: Secondary | ICD-10-CM | POA: Diagnosis not present

## 2019-06-24 DIAGNOSIS — R531 Weakness: Secondary | ICD-10-CM | POA: Diagnosis not present

## 2019-06-24 DIAGNOSIS — I679 Cerebrovascular disease, unspecified: Secondary | ICD-10-CM | POA: Diagnosis not present

## 2019-06-24 DIAGNOSIS — G4733 Obstructive sleep apnea (adult) (pediatric): Secondary | ICD-10-CM | POA: Diagnosis present

## 2019-06-24 DIAGNOSIS — M7989 Other specified soft tissue disorders: Secondary | ICD-10-CM | POA: Diagnosis present

## 2019-06-24 DIAGNOSIS — Z825 Family history of asthma and other chronic lower respiratory diseases: Secondary | ICD-10-CM

## 2019-06-24 DIAGNOSIS — I10 Essential (primary) hypertension: Secondary | ICD-10-CM | POA: Diagnosis present

## 2019-06-24 DIAGNOSIS — I63511 Cerebral infarction due to unspecified occlusion or stenosis of right middle cerebral artery: Principal | ICD-10-CM | POA: Diagnosis present

## 2019-06-24 DIAGNOSIS — E1151 Type 2 diabetes mellitus with diabetic peripheral angiopathy without gangrene: Secondary | ICD-10-CM | POA: Diagnosis present

## 2019-06-24 DIAGNOSIS — Z20822 Contact with and (suspected) exposure to covid-19: Secondary | ICD-10-CM | POA: Diagnosis present

## 2019-06-24 DIAGNOSIS — Z833 Family history of diabetes mellitus: Secondary | ICD-10-CM | POA: Diagnosis not present

## 2019-06-24 DIAGNOSIS — N179 Acute kidney failure, unspecified: Secondary | ICD-10-CM | POA: Diagnosis present

## 2019-06-24 DIAGNOSIS — R269 Unspecified abnormalities of gait and mobility: Secondary | ICD-10-CM | POA: Diagnosis not present

## 2019-06-24 DIAGNOSIS — Z9181 History of falling: Secondary | ICD-10-CM | POA: Diagnosis not present

## 2019-06-24 DIAGNOSIS — E785 Hyperlipidemia, unspecified: Secondary | ICD-10-CM | POA: Diagnosis present

## 2019-06-24 DIAGNOSIS — E119 Type 2 diabetes mellitus without complications: Secondary | ICD-10-CM | POA: Diagnosis present

## 2019-06-24 DIAGNOSIS — Z8249 Family history of ischemic heart disease and other diseases of the circulatory system: Secondary | ICD-10-CM

## 2019-06-24 DIAGNOSIS — R609 Edema, unspecified: Secondary | ICD-10-CM | POA: Diagnosis present

## 2019-06-24 DIAGNOSIS — R262 Difficulty in walking, not elsewhere classified: Secondary | ICD-10-CM | POA: Diagnosis not present

## 2019-06-24 DIAGNOSIS — I639 Cerebral infarction, unspecified: Secondary | ICD-10-CM | POA: Diagnosis present

## 2019-06-24 DIAGNOSIS — Z7984 Long term (current) use of oral hypoglycemic drugs: Secondary | ICD-10-CM

## 2019-06-24 DIAGNOSIS — Z7902 Long term (current) use of antithrombotics/antiplatelets: Secondary | ICD-10-CM | POA: Diagnosis not present

## 2019-06-24 DIAGNOSIS — Z9079 Acquired absence of other genital organ(s): Secondary | ICD-10-CM | POA: Diagnosis not present

## 2019-06-24 DIAGNOSIS — E7800 Pure hypercholesterolemia, unspecified: Secondary | ICD-10-CM | POA: Diagnosis present

## 2019-06-24 DIAGNOSIS — Z7982 Long term (current) use of aspirin: Secondary | ICD-10-CM | POA: Diagnosis not present

## 2019-06-24 DIAGNOSIS — I160 Hypertensive urgency: Secondary | ICD-10-CM | POA: Diagnosis not present

## 2019-06-24 DIAGNOSIS — Z79899 Other long term (current) drug therapy: Secondary | ICD-10-CM | POA: Diagnosis not present

## 2019-06-24 DIAGNOSIS — I63311 Cerebral infarction due to thrombosis of right middle cerebral artery: Secondary | ICD-10-CM | POA: Diagnosis not present

## 2019-06-24 DIAGNOSIS — Z8546 Personal history of malignant neoplasm of prostate: Secondary | ICD-10-CM | POA: Diagnosis not present

## 2019-06-24 DIAGNOSIS — F331 Major depressive disorder, recurrent, moderate: Secondary | ICD-10-CM | POA: Diagnosis not present

## 2019-06-24 DIAGNOSIS — R296 Repeated falls: Secondary | ICD-10-CM | POA: Diagnosis present

## 2019-06-24 DIAGNOSIS — E1165 Type 2 diabetes mellitus with hyperglycemia: Secondary | ICD-10-CM | POA: Diagnosis present

## 2019-06-24 DIAGNOSIS — I69351 Hemiplegia and hemiparesis following cerebral infarction affecting right dominant side: Secondary | ICD-10-CM | POA: Diagnosis not present

## 2019-06-24 DIAGNOSIS — E1169 Type 2 diabetes mellitus with other specified complication: Secondary | ICD-10-CM | POA: Diagnosis not present

## 2019-06-24 DIAGNOSIS — E669 Obesity, unspecified: Secondary | ICD-10-CM | POA: Diagnosis not present

## 2019-06-24 LAB — RAPID URINE DRUG SCREEN, HOSP PERFORMED
Amphetamines: NOT DETECTED
Barbiturates: NOT DETECTED
Benzodiazepines: NOT DETECTED
Cocaine: NOT DETECTED
Opiates: NOT DETECTED
Tetrahydrocannabinol: NOT DETECTED

## 2019-06-24 LAB — COMPREHENSIVE METABOLIC PANEL
ALT: 21 U/L (ref 0–44)
AST: 24 U/L (ref 15–41)
Albumin: 4 g/dL (ref 3.5–5.0)
Alkaline Phosphatase: 33 U/L — ABNORMAL LOW (ref 38–126)
Anion gap: 10 (ref 5–15)
BUN: 17 mg/dL (ref 8–23)
CO2: 27 mmol/L (ref 22–32)
Calcium: 8.9 mg/dL (ref 8.9–10.3)
Chloride: 101 mmol/L (ref 98–111)
Creatinine, Ser: 0.93 mg/dL (ref 0.61–1.24)
GFR calc Af Amer: >60 mL/min (ref >60)
GFR calc non Af Amer: >60 mL/min (ref >60)
Glucose, Bld: 158 mg/dL — ABNORMAL HIGH (ref 70–99)
Potassium: 3.8 mmol/L (ref 3.5–5.1)
Sodium: 138 mmol/L (ref 135–145)
Total Bilirubin: 0.7 mg/dL (ref 0.3–1.2)
Total Protein: 6.8 g/dL (ref 6.5–8.1)

## 2019-06-24 LAB — DIFFERENTIAL
Abs Immature Granulocytes: 0.03 10*3/uL (ref 0.00–0.07)
Basophils Absolute: 0 10*3/uL (ref 0.0–0.1)
Basophils Relative: 1 %
Eosinophils Absolute: 0.1 10*3/uL (ref 0.0–0.5)
Eosinophils Relative: 2 %
Immature Granulocytes: 0 %
Lymphocytes Relative: 21 %
Lymphs Abs: 1.5 10*3/uL (ref 0.7–4.0)
Monocytes Absolute: 0.5 10*3/uL (ref 0.1–1.0)
Monocytes Relative: 6 %
Neutro Abs: 5.1 10*3/uL (ref 1.7–7.7)
Neutrophils Relative %: 70 %

## 2019-06-24 LAB — PROTIME-INR
INR: 1.1 (ref 0.8–1.2)
Prothrombin Time: 13.6 seconds (ref 11.4–15.2)

## 2019-06-24 LAB — URINALYSIS, ROUTINE W REFLEX MICROSCOPIC
Bilirubin Urine: NEGATIVE
Glucose, UA: 250 mg/dL — AB
Hgb urine dipstick: NEGATIVE
Ketones, ur: NEGATIVE mg/dL
Leukocytes,Ua: NEGATIVE
Nitrite: NEGATIVE
Protein, ur: NEGATIVE mg/dL
Specific Gravity, Urine: 1.025 (ref 1.005–1.030)
pH: 6.5 (ref 5.0–8.0)

## 2019-06-24 LAB — CBC
HCT: 44.5 % (ref 39.0–52.0)
Hemoglobin: 14.8 g/dL (ref 13.0–17.0)
MCH: 27.5 pg (ref 26.0–34.0)
MCHC: 33.3 g/dL (ref 30.0–36.0)
MCV: 82.7 fL (ref 80.0–100.0)
Platelets: 245 10*3/uL (ref 150–400)
RBC: 5.38 MIL/uL (ref 4.22–5.81)
RDW: 12.8 % (ref 11.5–15.5)
WBC: 7.3 10*3/uL (ref 4.0–10.5)
nRBC: 0 % (ref 0.0–0.2)

## 2019-06-24 LAB — RESPIRATORY PANEL BY RT PCR (FLU A&B, COVID)
Influenza A by PCR: NEGATIVE
Influenza B by PCR: NEGATIVE
SARS Coronavirus 2 by RT PCR: NEGATIVE

## 2019-06-24 LAB — ETHANOL: Alcohol, Ethyl (B): <10 mg/dL (ref <10)

## 2019-06-24 LAB — APTT: aPTT: 26 seconds (ref 24–36)

## 2019-06-24 LAB — CBG MONITORING, ED: Glucose-Capillary: 82 mg/dL (ref 70–99)

## 2019-06-24 MED ORDER — ASPIRIN 325 MG PO TABS
325.0000 mg | ORAL_TABLET | Freq: Every day | ORAL | Status: DC
  Administered 2019-06-24: 22:00:00 325 mg via ORAL
  Filled 2019-06-24: qty 1

## 2019-06-24 MED ORDER — SENNOSIDES-DOCUSATE SODIUM 8.6-50 MG PO TABS: 1.0000 | ORAL_TABLET | Freq: Every evening | ORAL | Status: DC | PRN

## 2019-06-24 MED ORDER — INSULIN ASPART 100 UNIT/ML ~~LOC~~ SOLN
0.0000 [IU] | Freq: Three times a day (TID) | SUBCUTANEOUS | Status: DC
  Administered 2019-06-25 – 2019-06-26 (×2): 2 [IU] via SUBCUTANEOUS
  Administered 2019-06-26: 1 [IU] via SUBCUTANEOUS
  Administered 2019-06-27 – 2019-06-28 (×3): 2 [IU] via SUBCUTANEOUS
  Administered 2019-06-28: 3 [IU] via SUBCUTANEOUS

## 2019-06-24 MED ORDER — ENOXAPARIN SODIUM 40 MG/0.4ML ~~LOC~~ SOLN
40.0000 mg | SUBCUTANEOUS | Status: DC
  Administered 2019-06-24: 40 mg via SUBCUTANEOUS
  Filled 2019-06-24: qty 0.4

## 2019-06-24 MED ORDER — ACETAMINOPHEN 325 MG PO TABS: 650.0000 mg | ORAL_TABLET | ORAL | Status: DC | PRN

## 2019-06-24 MED ORDER — ACETAMINOPHEN 160 MG/5ML PO SOLN: 650.0000 mg | ORAL | Status: DC | PRN

## 2019-06-24 MED ORDER — SODIUM CHLORIDE 0.9 % IV BOLUS
500.0000 mL | Freq: Once | INTRAVENOUS | Status: AC
  Administered 2019-06-24: 500 mL via INTRAVENOUS

## 2019-06-24 MED ORDER — SODIUM CHLORIDE 0.9 % IV SOLN: 100.0000 mL/h | INTRAVENOUS | Status: DC

## 2019-06-24 MED ORDER — FUROSEMIDE 20 MG PO TABS
20.0000 mg | ORAL_TABLET | Freq: Every day | ORAL | Status: DC
  Administered 2019-06-24 – 2019-06-28 (×5): 20 mg via ORAL
  Filled 2019-06-24 (×5): qty 1

## 2019-06-24 MED ORDER — ACETAMINOPHEN 650 MG RE SUPP: 650.0000 mg | RECTAL | Status: DC | PRN

## 2019-06-24 MED ORDER — ATORVASTATIN CALCIUM 80 MG PO TABS
80.0000 mg | ORAL_TABLET | Freq: Every day | ORAL | Status: DC
  Administered 2019-06-24 – 2019-06-28 (×5): 80 mg via ORAL
  Filled 2019-06-24 (×5): qty 1

## 2019-06-24 MED ORDER — ASPIRIN 300 MG RE SUPP: 300.0000 mg | Freq: Every day | RECTAL | Status: DC

## 2019-06-24 MED ORDER — CLOPIDOGREL BISULFATE 75 MG PO TABS
75.0000 mg | ORAL_TABLET | Freq: Every day | ORAL | Status: DC
  Administered 2019-06-24 – 2019-06-28 (×5): 75 mg via ORAL
  Filled 2019-06-24 (×5): qty 1

## 2019-06-24 MED ORDER — IOHEXOL 350 MG/ML SOLN
75.0000 mL | Freq: Once | INTRAVENOUS | Status: AC | PRN
  Administered 2019-06-24: 75 mL via INTRAVENOUS

## 2019-06-24 MED ORDER — STROKE: EARLY STAGES OF RECOVERY BOOK: Freq: Once | Status: AC

## 2019-06-24 NOTE — ED Notes (Signed)
PT reports he fell 5 times in the past week.

## 2019-06-24 NOTE — ED Notes (Signed)
Pt transported to CT ?

## 2019-06-24 NOTE — ED Provider Notes (Addendum)
Patient seen by Dr Gilford Raid at [facility] - see her note from today, and pt sent here for MRI, as not available there.   Patient w multiple falls in past week. Pt states mechanical falls, trips, no faintness or dizziness prior to fall. Pt denies head injury or headache. Pt denies neck or back pain or injury. Denies numbness/weakness.   No fever/chills. No cp or discomfort. No sob or unusual doe. No nvd. No gu c/o.   CTLS spine, non tender, aligned, no step off.  Chest cta. abd soft nt.   Motor intact bil, stre 5/5. sens grossly intact bil.   Earlier imaging and lab results reviewed. MRI ordered.   MRI resulted - c/w 2 areas of small, acute cva. Medicine service consulted for admission.  Discussed w [facility] - they will admit, request neurology consult.   Neurology consulted-  Discussed w Dr Leonel Ramsay - he will see in consult.   Recheck pt, no change from earlier. Discussed mri and plan.       Lajean Saver, MD 06/24/19 2039

## 2019-06-24 NOTE — ED Triage Notes (Addendum)
Pt has had multiple falls in the last few days. Pt wife is struggling to care for him since the last 2 weeks, he has been unable to walk safely. Case management and additional testing has been requested by wife due to acute change in condition. States left hip pain/bruising and knee pain. Pt is on blood thinners.

## 2019-06-24 NOTE — ED Notes (Signed)
Report attempted x 3. Once to Network engineer, twice to charge phone with no answer. [facility] on the way to [facility] to transfer to [facility] ED

## 2019-06-24 NOTE — ED Notes (Signed)
PT transported to MRI

## 2019-06-24 NOTE — ED Notes (Signed)
PT transferred from [facility] for MRI. PT NIH score 2 . Pt was brought to [place]  By Wife because of falls. Wife reported PT had a base line of speech slow to find words.

## 2019-06-24 NOTE — Hospital Course (Addendum)
Admitted 06/24/2019  Allergies: Patient has no known allergies. Pertinent Hx: Stroke, DM2, Depression, HTN, HLD  79 y.o. male p/w recurrent falls, LLE and unsteadiness. Sent from [facility] MRI, concerning for stroke.   * Acute stroke: LLE weakness, unsteadiness and falls since 2 days ago. On Plavix at home. MRI w 2 small acute infarcts at R MCA territory. No deficit on arrival. Neuro consulted.  *HTN: Holding home losartan for permissive HTN.  Lipitor, Plavix, citalopram, fenofibrate, Lasix, glipizide, losartan, Metformin, Ozempic, sertraline  Consults: Neuro  Meds: ASA, Plavix, Lipitor, SSI, Lasix, VTE ppx: Lovenox IVF: None diet: NPO until passes swallow eval.

## 2019-06-24 NOTE — Consult Note (Signed)
Neurology Consultation Reason for Consult: Stroke Referring Physician: Spinal, K  CC: Falls  History is obtained from: Patient  HPI: Justin [name] is a 79 y.o. male who reports an increased number of falls over the past few weeks.  He has a history of strokes, and drags his right foot some, but was not having any falls until the past few weeks.  Especially over the past week, he notes worsening problems with falling.  He denies numbness, weakness, vertigo, or any other symptoms.   LKW: Unclear, likely at least a week ago tpa given?: no, outside of window   ROS: A 14 point ROS was performed and is negative except as noted in the HPI.   Past Medical History:  Diagnosis Date  . Brain bleed ([place])   . Cancer [facility])    prostate  . Depression   . Diabetes mellitus without complication ([place])   . High cholesterol   . Hypertension   . Stroke [facility])      Family History  Problem Relation Age of Onset  . CAD Mother   . Diabetes Mother   . Emphysema Father   . Diabetes Sister   . Diabetes Brother   . Diabetes Sister   . Diabetes Brother   . Diabetes Mellitus II Neg Hx      Social History:  reports that he has never smoked. He has never used smokeless tobacco. He reports that he does not drink alcohol or use drugs.   Exam: Current vital signs: BP (!) 179/90   Pulse 69   Temp 98.2 F (36.8 C) (Oral)   Resp 16   SpO2 97%  Vital signs in last 24 hours: Temp:  [98.2 F (36.8 C)] 98.2 F (36.8 C) (05/02 1016) Pulse Rate:  [69-77] 69 (05/02 2045) Resp:  [16-21] 16 (05/02 2045) BP: (155-179)/(77-129) 179/90 (05/02 2045) SpO2:  [94 %-100 %] 97 % (05/02 2045)   Physical Exam  Constitutional: Appears well-developed and well-nourished.  Psych: Affect appropriate to situation Eyes: No scleral injection HENT: No OP obstrucion MSK: no joint deformities.  Cardiovascular: Normal rate and regular rhythm.  Respiratory: Effort normal, non-labored breathing GI: Soft.  No  distension. There is no tenderness.  Skin: WDI  Neuro: Mental Status: Patient is awake, alert, oriented to person, place, month, year, and situation. Patient is able to give a clear and coherent history. No signs of aphasia or neglect Cranial Nerves: II: Visual Fields are full. Pupils are equal, round, and reactive to light.   III,IV, VI: EOMI without ptosis or diploplia.  V: Facial sensation is symmetric to temperature VII: Facial movement is symmetric.  VIII: hearing is intact to voice X: Uvula elevates symmetrically XI: Shoulder shrug is symmetric. XII: tongue is midline without atrophy or fasciculations.  Motor: Tone is normal. Bulk is normal. 5/5 strength was present in all four extremities.  Sensory: Sensation is symmetric to light touch and temperature in the arms and legs. Cerebellar: He has mild intentional tremor bilaterally.    I have reviewed labs in epic and the results pertinent to this consultation are: CMP-unremarkable CBC-negative  I have reviewed the images obtained: MRI brain to subcortical infarcts on the right  Impression: 79 year old male with a history of multiple strokes, hypertension, hyperlipidemia, diabetes who presents with recurrent infarcts most consistent with concurrent small vessel disease.  He had been on dual antiplatelet therapy for several months, but is currently on monotherapy.  He is being admitted for PT evaluation and risk factor  modification.  Recommendations: - HgbA1c, fasting lipid panel - Frequent neuro checks - Echocardiogram - Carotid dopplers - Prophylactic therapy-Antiplatelet med: Aspirin -81 mg daily, Plavix 75 mg daily - Risk factor modification - Telemetry monitoring - PT consult, OT consult, Speech consult - Stroke team to follow   Roland Rack, MD [facility] [phone]  If 7pm- 7am, please page neurology on call as listed in [place].

## 2019-06-24 NOTE — H&P (Signed)
Date: 06/24/2019               Patient Name:  Justin [name] MRN: [id number]  DOB: 12-02-1940 Age / Sex: 79 y.o., male   PCP: Chesley Noon, MD         Medical Service: Internal Medicine Teaching Service         Attending Physician: Dr. Sid Falcon, MD    First Contact: Dr. Jose Persia  Pager: [phone]  Second Contact: Dr. Lonia Skinner  Pager: [phone]       After Hours (After 5p/  First Contact Pager: [phone]  weekends / holidays): Second Contact Pager: [phone]   Chief Complaint: falls at home  History of Present Illness:  Justin [name] is a 79 year old M with significant PMH of strokes (left basal ganglia in 08/2017 and left corona radiata in 09/2018) with residual deficits of cognitive impairment, hypertension, type II diabetes, hyperlipidemia, OSA on CPAP, and prostate cancer s/p prostatectomy in 2012, who initially presented to [facility] for recurrent falls over the last four days.   Pt states he gradually began having unsteadiness and falls at home on Thursday 4/29. Pt attributed his imbalance to LLE weakness and L knee + hip pain. Says the fire department had to come to his house 4 times during that period to help him up. Endorses falls were mechanical and trips because of L leg weakness. Denies head trauma, headache, numbness or sensory change. No dysarthria, dizziness, vision changes, loss of consciousness, chest pain, palpitations, shortness of breath, or nausea/vomiting. Pt endorses being on a blood thinner, but does not know a name. Says his wife manages all his medications. Was seen by his PCP 2 weeks ago, and notes his diabetes and hypertension were well controlled at that time.   Initially, pt was brought to [facility] for inability to walk without assistance and altered mental status. CT head showed no acute changes or infarcts. Pt was transferred to the [facility] ED for MRI given high suspicion for recurrent stroke. At [facility], MRI  resulted with 2 small acute infarcts. IMTS was called for admission and neurology was consulted.  Meds:  Current Meds  Medication Sig  . atorvastatin (LIPITOR) 80 MG tablet TAKE 1 TABLET EVERY DAY (Patient taking differently: Take 80 mg by mouth daily. )  . Cholecalciferol (VITAMIN D-1000 MAX ST) 1000 units tablet Take 1,000 Units by mouth daily.  . citalopram (CELEXA) 10 MG tablet Take 0.5 tablets (5 mg total) by mouth daily for 8 days. (Patient taking differently: Take 20 mg by mouth in the morning and at bedtime. )  . clopidogrel (PLAVIX) 75 MG tablet Take 1 tablet (75 mg total) by mouth daily.  . cyanocobalamin ([employer] VITAMIN B12) 1000 MCG tablet Take 1,000 mcg by mouth daily.   . fenofibrate 160 MG tablet Take 160 mg by mouth daily.  . furosemide (LASIX) 20 MG tablet Take 20 mg by mouth daily.   Marland Kitchen glipiZIDE (GLUCOTROL) 10 MG tablet Take 10 mg by mouth daily.   Marland Kitchen losartan (COZAAR) 100 MG tablet Take 100 mg by mouth daily.  . metFORMIN (GLUCOPHAGE) 500 MG tablet Take 1,000 mg by mouth 2 (two) times daily with a meal.  . mirabegron ER (MYRBETRIQ) 50 MG TB24 tablet Take 50 mg by mouth daily.  . Omega-3 Fatty Acids (FISH OIL) 1000 MG CAPS Take 1,000 mg by mouth daily with breakfast.  . polyvinyl alcohol (LIQUIFILM TEARS) 1.4 %  ophthalmic solution Place 1 drop into both eyes 4 (four) times daily as needed for dry eyes.  Marland Kitchen PRESCRIPTION MEDICATION Inject 3-13 Units into the skin daily. Takes 13 units in the morning and 3 units at night. Patient not sure about the name of insulin.  . Semaglutide (OZEMPIC) 0.25 or 0.5 MG/DOSE SOPN Inject 0.25 mg into the skin once a week.  . sertraline (ZOLOFT) 25 MG tablet Take 1 tablet (25 mg total) by mouth daily.   Allergies: Allergies as of 06/24/2019  . (No Known Allergies)   Past Medical History:  Diagnosis Date  . Brain bleed ([place])   . Cancer [facility])    prostate  . Depression   . Diabetes mellitus without complication ([place])   . High cholesterol   .  Hypertension   . Stroke [facility])    Family History:  Multiple family members with diabetes and hypertension including mother, 2 sisters, and brother. No family history of strokes.   Social History:  Pt lives at home in [place] with his wife. Ambulates with a cane and occasionally a walker. His wife, [name], manages his medications using a pill box, and the pt takes them on his own. Pt states he is able to perform his own ADLs, and wife helps with other IADLs.  Never smoker. Denies EtOH or other substance use.  Review of Systems: Review of Systems  Constitutional: Negative for chills, fever and malaise/fatigue.  HENT: Negative.   Eyes: Negative for blurred vision and double vision.  Respiratory: Negative for cough and shortness of breath.   Cardiovascular: Negative for chest pain and palpitations.  Gastrointestinal: Negative for abdominal pain, nausea and vomiting.  Musculoskeletal: Positive for falls and joint pain (L knee and hip). Negative for back pain and myalgias.  Skin: Negative.   Neurological: Positive for focal weakness (LLE). Negative for dizziness, sensory change, speech change and headaches.  Psychiatric/Behavioral: Positive for memory loss.   Physical Exam: Blood pressure (!) 170/89, pulse 70, temperature 98.2 F (36.8 C), temperature source Oral, resp. rate 17, SpO2 94 %. Physical Exam Vitals and nursing note reviewed.  Constitutional:      General: He is not in acute distress.    Appearance: Normal appearance. He is obese. He is not ill-appearing.  HENT:     Head: Normocephalic and atraumatic.     Mouth/Throat:     Mouth: Mucous membranes are moist.     Pharynx: No oropharyngeal exudate.  Eyes:     Extraocular Movements: Extraocular movements intact.     Conjunctiva/sclera: Conjunctivae normal.     Pupils: Pupils are equal, round, and reactive to light.  Cardiovascular:     Rate and Rhythm: Normal rate and regular rhythm.     Heart sounds: Normal heart sounds.  No murmur. No friction rub. No gallop.   Pulmonary:     Effort: Pulmonary effort is normal. No respiratory distress.     Breath sounds: Normal breath sounds. No wheezing, rhonchi or rales.  Abdominal:     General: Bowel sounds are normal.     Palpations: Abdomen is soft.     Tenderness: There is no abdominal tenderness.     Comments: Distension secondary to body habitus  Musculoskeletal:        General: Tenderness (L greater trochanter ) present. No swelling, deformity or signs of injury. Normal range of motion.     Cervical back: Normal range of motion and neck supple.     Comments: Bilaterally lower extremity pitting edema, trace  on L and +1 on R (pt states this is stable).  Skin:    General: Skin is warm and dry.     Comments: No obvious bruising over L hip or knee. Small linear abrasion over L knee.   Neurological:     Mental Status: He is alert.     Comments: Mental Status: Patient is awake, alert, oriented x2 (person and time) No signs of aphasia or neglect Cranial Nerves: II: Pupils equal, round, and reactive to light.  III,IV, VI: EOMI without ptosis or diploplia.  V: Facial sensation is symmetric tolight touch VII: Facial movement is symmetric.  VIII: Hearing is intact to voice X: Uvula elevates symmetrically XI: Shoulder shrug is symmetric. XII: Tongue is midline without atrophy or fasciculations.  Motor: Good effort thorughout, 5/5 bilateral UE, 5/5 bilateral LE Sensory: Sensation is grossly intact in bilateral UEs & LEs Cerebellar: Finger-Nose and Heel-Shin intact bilaterally   Psychiatric:        Speech: Speech normal.        Cognition and Memory: Memory is impaired.    EKG: personally reviewed my interpretation is sinus rhythm, right bundle branch block, stable from prior in 09/2018  CXR: personally reviewed my interpretation is no cardiomegaly, focal consolidations, pleural effusions, or pneumothorax. Cardiomediastinal silhouette stable from prior in  04/2013.  MRI Brain IMPRESSION: 1. There are 2 small acute infarcts within the right MCA territory, at the centrum semiovale and posterior corona radiata. No acute hemorrhage or mass effect. 2. Findings of advanced chronic ischemic microangiopathy and widespread atrophy greater than expected for age.  Assessment & Plan by Problem: Active Problems:   Stroke [facility])  Mr. Kuznia is a 79 year old M with significant PMH of strokes with residual deficits of cognitive impairment, hypertension, type II diabetes, hyperlipidemia, OSA on CPAP, and prostate cancer s/p prostatectomy in 2012, who initially presented for recurrent falls and was found to have 2 acute R MCA infarcts.  Stroke Last known normal on Thursday 4/29. MRI with 2 acute infarcts in the right MCA territory at the centrum semiovale and posterior corona radiata. No focal deficits on exam - full strength and sensation. Possible impaired memory though unclear how deviated from baseline. Pt with history of prior strokes, initially left basal ganglia in 08/2017 attributed to large/small vessel arthrosclerotic disease and then left corona radiata in 09/2018 thought to be secondary to medication non-adherence. Last seen by [facility] in Dec 2020, pt only with residual deficits of cognitive impairment at that time. Neuro recommended continuing clopidogrel 75mg  alone at that visit. However, PCP note from 2 weeks ago notes ASA 81mg  in assessment & plan though ASA 325mg  on med list in PCP note.  - both prior strokes attributed to vessel arthrosclerotic disease - unclear stroke etiology at this time - clopidogrel failure vs medication non adherence vs small vessel disease vs other  Neurology consulted, appreciate recommendations. - ASA 325mg  + clopidogrel 75mg  - atorvastatin 80mg  - echo - carotid ultrasound - SLP/PT/OT - tele - frequent neuro checks  Type II diabetes Last A1c 7.2 on 4/15. On semaglutide weekly, glipizide 10mg  BID, linagliptin  5mg  daily, and metformin 1000mg  BID.  - SSI  HTN BP well controlled at last PCP visit 125/74. On losartan 100mg , amlodipine 5mg , furosemide 20mg , and HCTZ 25mg  daily. Blood pressure elevated on admission to 160s/80s. - permissive HTN - holding home medications  Diet - NPO Fluids - none DVT ppx - lovenox 40mg  subQ daily CODE STATUS - FULL CODE  Dispo: Admit patient to Inpatient with expected length of stay greater than 2 midnights.  Signed: Ladona Horns, MD 06/24/2019, 9:00 PM  Pager: [phone]

## 2019-06-24 NOTE — ED Provider Notes (Signed)
[facility] EMERGENCY DEPARTMENT Provider Note   CSN: [id number] Arrival date & time: 06/24/19  0946     History Chief Complaint  Patient presents with   Fall   Hip Pain   Knee Pain    Justin [name] is a 79 y.o. male.  Pt presents to the ED today with a possible stroke.  Pt has a hx of stroke, but had been able to walk without any assistance.  His wife said he's fallen 5 times since Friday.  His ms has been off.  She also said it looks like his brain is not telling him how to walk.  The pt has been having to use a walker this weekend.  [name] said they had to call EMS out several times to get him off the floor.  She does not think he hit his head.  He is on Plavix.  He was c/o left hip and left knee pain after falling.  History is mainly obtained by pt's wife as he is unable to tell me what happened.        Past Medical History:  Diagnosis Date   Brain bleed ([place])    Cancer ([place])    prostate   Depression    Diabetes mellitus without complication ([place])    High cholesterol    Hypertension    Stroke [facility])     Patient Active Problem List   Diagnosis Date Noted   Type 2 diabetes mellitus ([place]) 10/04/2018   Depression 10/04/2018   Acute CVA (cerebrovascular accident) ([place]) 09/18/2017   Stroke (cerebrum) ([place]) 09/17/2017   Diabetes mellitus without complication ([place]) [id number]   Closed head injury 04/22/2013   HLD (hyperlipidemia) 07/29/2009   Essential hypertension 07/29/2009   EFFUSION, PLEURAL 07/29/2009   DIVERTICULITIS, COLON 07/29/2009   HYPERBILIRUBINEMIA 07/29/2009   COLONIC POLYPS, HYPERPLASTIC, HX OF 07/29/2009    Past Surgical History:  Procedure Laterality Date   arm & hand surgery Left 1972   skill saw accident   COLON SURGERY     partial   EYE SURGERY     lens implant and cateract- Left   PROSTATECTOMY         Family History  Problem Relation Age of Onset   CAD Mother    Diabetes Mother    Emphysema  Father    Diabetes Sister    Diabetes Brother    Diabetes Sister    Diabetes Brother    Diabetes Mellitus II Neg Hx     Social History   Tobacco Use   Smoking status: Never Smoker   Smokeless tobacco: Never Used  Substance Use Topics   Alcohol use: Never   Drug use: Never    Home Medications Prior to Admission medications   Medication Sig Start Date End Date Taking? Authorizing Provider  atorvastatin (LIPITOR) 80 MG tablet TAKE 1 TABLET EVERY DAY 12/11/18   Frann Rider, NP  Cholecalciferol (VITAMIN D-1000 MAX ST) 1000 units tablet Take 1,000 Units by mouth daily.    [provider]  citalopram (CELEXA) 10 MG tablet Take 0.5 tablets (5 mg total) by mouth daily for 8 days. 02/07/19 02/15/19  Frann Rider, NP  clopidogrel (PLAVIX) 75 MG tablet Take 1 tablet (75 mg total) by mouth daily. 10/20/17   Frann Rider, NP  cyanocobalamin (CVS VITAMIN B12) 1000 MCG tablet Take 1,000 mcg by mouth daily.     [provider]  fenofibrate 160 MG tablet Take 160 mg by mouth daily. 10/08/16 10/03/18  [provider]  furosemide (LASIX) 20 MG tablet Take 20 mg by mouth daily.  10/04/16 12/29/18  [provider]  glipiZIDE (GLUCOTROL) 10 MG tablet Take 10 mg by mouth 2 (two) times daily before a meal.    [provider]  losartan (COZAAR) 100 MG tablet Take 100 mg by mouth daily.    [provider]  metFORMIN (GLUCOPHAGE) 500 MG tablet Take 1,000 mg by mouth 2 (two) times daily with a meal.    [provider]  mirabegron ER (MYRBETRIQ) 50 MG TB24 tablet Take 50 mg by mouth daily.    [provider]  Omega-3 Fatty Acids (FISH OIL) 1000 MG CAPS Take 1,000 mg by mouth daily with breakfast.    [provider]  polyvinyl alcohol (LIQUIFILM TEARS) 1.4 % ophthalmic solution Place 1 drop into both eyes 4 (four) times daily as needed for dry eyes.    [provider]  Semaglutide (OZEMPIC) 0.25 or 0.5 MG/DOSE  SOPN Inject 0.25 mg into the skin once a week.    [provider]  sertraline (ZOLOFT) 25 MG tablet Take 1 tablet (25 mg total) by mouth daily. 04/10/19   Frann Rider, NP    Allergies    Patient has no known allergies.  Review of Systems   Review of Systems  Musculoskeletal:       Left knee/left hip pain  Neurological: Positive for weakness.  All other systems reviewed and are negative.   Physical Exam Updated Vital Signs BP (!) 162/87 (BP Location: Right Arm)    Pulse 72    Temp 98.2 F (36.8 C) (Oral)    Resp 16    SpO2 100%   Physical Exam Vitals and nursing note reviewed.  Constitutional:      Appearance: Normal appearance.  HENT:     Head: Normocephalic and atraumatic.     Right Ear: External ear normal.     Left Ear: External ear normal.     Nose: Nose normal.     Mouth/Throat:     Mouth: Mucous membranes are moist.     Pharynx: Oropharynx is clear.  Eyes:     Extraocular Movements: Extraocular movements intact.     Conjunctiva/sclera: Conjunctivae normal.     Pupils: Pupils are equal, round, and reactive to light.  Cardiovascular:     Rate and Rhythm: Normal rate and regular rhythm.     Pulses: Normal pulses.     Heart sounds: Normal heart sounds.  Pulmonary:     Effort: Pulmonary effort is normal.     Breath sounds: Normal breath sounds.  Abdominal:     General: Abdomen is flat. Bowel sounds are normal.     Palpations: Abdomen is soft.  Musculoskeletal:        General: Normal range of motion.     Cervical back: Normal range of motion and neck supple.       Legs:  Skin:    General: Skin is warm.     Capillary Refill: Capillary refill takes less than 2 seconds.  Neurological:     Mental Status: He is alert. He is disoriented and confused.     Comments: Oriented to person and place.  Not to time.    Psychiatric:        Mood and Affect: Mood normal.        Behavior: Behavior normal.        Thought Content: Thought content normal.  Judgment: Judgment normal.     ED Results / Procedures / Treatments   Labs (all labs ordered are listed, but only abnormal results are displayed) Labs Reviewed  COMPREHENSIVE METABOLIC PANEL - Abnormal; Notable for the following components:      Result Value   Glucose, Bld 158 (*)    Alkaline Phosphatase 33 (*)    All other components within normal limits  URINALYSIS, ROUTINE W REFLEX MICROSCOPIC - Abnormal; Notable for the following components:   Glucose, UA 250 (*)    All other components within normal limits  ETHANOL  PROTIME-INR  APTT  CBC  DIFFERENTIAL  RAPID URINE DRUG SCREEN, HOSP PERFORMED    EKG EKG Interpretation  Date/Time:  Sunday Jun 24 2019 10:57:23 EDT Ventricular Rate:  71 PR Interval:    QRS Duration: 154 QT Interval:  418 QTC Calculation: 455 R Axis:   -55 Text Interpretation: Sinus rhythm Right bundle branch block Inferior infarct, old No significant change since last tracing Confirmed by Isla Pence [phone]) on 06/24/2019 11:31:35 AM   Radiology DG Chest 2 View  Result Date: 06/24/2019 CLINICAL DATA:  Multiple falls EXAM: CHEST - 2 VIEW COMPARISON:  04/22/2013 chest radiograph. FINDINGS: Stable cardiomediastinal silhouette with normal heart size. No pneumothorax. No pleural effusion. Lungs appear clear, with no acute consolidative airspace disease and no pulmonary edema. No displaced fractures in the visualized chest. IMPRESSION: No active cardiopulmonary disease. Electronically Signed   By: Ilona Sorrel M.D.   On: 06/24/2019 10:55   CT HEAD WO CONTRAST  Result Date: 06/24/2019 CLINICAL DATA:  Multiple falls in past few days, unable to walk safely, LEFT hip pain and bruising, knee pain, on blood thinners, question stroke, history prostate cancer, hypertension, diabetes mellitus, stroke EXAM: CT HEAD WITHOUT CONTRAST TECHNIQUE: Contiguous axial images were obtained from the base of the skull through the vertex without intravenous contrast. Sagittal and  coronal MPR images reconstructed from axial data set. COMPARISON:  10/03/2018 FINDINGS: Brain: Generalized atrophy. Ex vacuo dilatation of the LEFT lateral ventricle. No midline shift or mass effect. Small vessel chronic ischemic changes of deep cerebral white matter. Old infarct anterior LEFT basal ganglia and periventricular white matter. Small old RIGHT basal ganglia and RIGHT thalamic infarcts. No intracranial hemorrhage, mass lesion, or evidence of acute infarction. No extra-axial fluid collections. Vascular: No hyperdense vessels. Mild atherosclerotic calcification of internal carotid arteries bilaterally at skull base Skull: Intact Sinuses/Orbits: Clear Other: N/A IMPRESSION: Atrophy with small vessel chronic ischemic changes of deep cerebral white matter. Old BILATERAL basal ganglia and RIGHT thalamic lacunar infarcts as well as old LEFT frontal periventricular white matter infarct. No acute intracranial abnormalities. Electronically Signed   By: Lavonia Dana M.D.   On: 06/24/2019 10:49   DG Knee Complete 4 Views Left  Result Date: 06/24/2019 CLINICAL DATA:  Multiple falls. Left knee pain. EXAM: LEFT KNEE - COMPLETE 4+ VIEW COMPARISON:  None. FINDINGS: No fracture, joint effusion or dislocation. No focal osseous lesions. Mild tricompartmental osteoarthritis, most prominent in the patellofemoral compartment. No radiopaque foreign bodies. IMPRESSION: No left knee fracture, joint effusion or dislocation. Mild tricompartmental left knee osteoarthritis, most prominent in the patellofemoral compartment. Electronically Signed   By: Ilona Sorrel M.D.   On: 06/24/2019 10:59   DG Hip Unilat W or Wo Pelvis 2-3 Views Left  Result Date: 06/24/2019 CLINICAL DATA:  Multiple falls, left hip pain EXAM: DG HIP (WITH OR WITHOUT PELVIS) 2-3V LEFT COMPARISON:  None. FINDINGS: No left hip fracture or dislocation. No  pelvic fracture or diastasis. No focal osseous lesions. Degenerative changes in the visualized lower lumbar  spine. No radiopaque foreign bodies. No significant left hip arthropathy. IMPRESSION: No fracture. No left hip malalignment. Electronically Signed   By: Ilona Sorrel M.D.   On: 06/24/2019 10:58    Procedures Procedures (including critical care time)  Medications Ordered in ED Medications  sodium chloride 0.9 % bolus 500 mL (0 mLs Intravenous Stopped 06/24/19 1240)    Followed by  0.9 %  sodium chloride infusion (has no administration in time range)    ED Course  I have reviewed the triage vital signs and the nursing notes.  Pertinent labs & imaging results that were available during my care of the patient were reviewed by me and considered in my medical decision making (see chart for details).    MDM Rules/Calculators/A&P                      Labs unremarkable.  CT shows nothing acute.  Due to concern for stroke, pt will need a MRI.  This is currently unavailable at [facility].  Pt d/w Dr. Gustavus Messing for transfer to [facility].   Final Clinical Impression(s) / ED Diagnoses Final diagnoses:  Ambulatory dysfunction    Rx / DC Orders ED Discharge Orders    None       Isla Pence, MD 06/24/19 1246

## 2019-06-25 ENCOUNTER — Inpatient Hospital Stay (HOSPITAL_COMMUNITY): Payer: No Typology Code available for payment source

## 2019-06-25 ENCOUNTER — Inpatient Hospital Stay (HOSPITAL_COMMUNITY)
Admit: 2019-06-25 | Discharge: 2019-06-25 | Disposition: A | Discharge status: Home / Self Care | Payer: No Typology Code available for payment source | Attending: Internal Medicine | Admitting: Internal Medicine

## 2019-06-25 DIAGNOSIS — I639 Cerebral infarction, unspecified: Secondary | ICD-10-CM

## 2019-06-25 DIAGNOSIS — I6389 Other cerebral infarction: Secondary | ICD-10-CM

## 2019-06-25 DIAGNOSIS — E7800 Pure hypercholesterolemia, unspecified: Secondary | ICD-10-CM

## 2019-06-25 DIAGNOSIS — I160 Hypertensive urgency: Secondary | ICD-10-CM

## 2019-06-25 DIAGNOSIS — R609 Edema, unspecified: Secondary | ICD-10-CM | POA: Diagnosis not present

## 2019-06-25 DIAGNOSIS — Z8673 Personal history of transient ischemic attack (TIA), and cerebral infarction without residual deficits: Secondary | ICD-10-CM

## 2019-06-25 DIAGNOSIS — I63311 Cerebral infarction due to thrombosis of right middle cerebral artery: Secondary | ICD-10-CM

## 2019-06-25 DIAGNOSIS — I679 Cerebrovascular disease, unspecified: Secondary | ICD-10-CM

## 2019-06-25 DIAGNOSIS — R262 Difficulty in walking, not elsewhere classified: Secondary | ICD-10-CM

## 2019-06-25 LAB — GLUCOSE, CAPILLARY
Glucose-Capillary: 86 mg/dL (ref 70–99)
Glucose-Capillary: 188 mg/dL — ABNORMAL HIGH (ref 70–99)
Glucose-Capillary: 168 mg/dL — ABNORMAL HIGH (ref 70–99)

## 2019-06-25 LAB — BASIC METABOLIC PANEL
Anion gap: 6 (ref 5–15)
BUN: 11 mg/dL (ref 8–23)
CO2: 26 mmol/L (ref 22–32)
Calcium: 8.5 mg/dL — ABNORMAL LOW (ref 8.9–10.3)
Chloride: 106 mmol/L (ref 98–111)
Creatinine, Ser: 0.97 mg/dL (ref 0.61–1.24)
GFR calc Af Amer: >60 mL/min (ref >60)
GFR calc non Af Amer: >60 mL/min (ref >60)
Glucose, Bld: 166 mg/dL — ABNORMAL HIGH (ref 70–99)
Potassium: 3.6 mmol/L (ref 3.5–5.1)
Sodium: 138 mmol/L (ref 135–145)

## 2019-06-25 LAB — HEMOGLOBIN A1C
Hgb A1c MFr Bld: 7.2 % — ABNORMAL HIGH (ref 4.8–5.6)
Mean Plasma Glucose: 159.94 mg/dL

## 2019-06-25 LAB — LIPID PANEL
Cholesterol: 103 mg/dL (ref 0–200)
HDL: 28 mg/dL — ABNORMAL LOW (ref >40)
LDL Cholesterol: 48 mg/dL (ref 0–99)
Total CHOL/HDL Ratio: 3.7 RATIO
Triglycerides: 136 mg/dL (ref <150)
VLDL: 27 mg/dL (ref 0–40)

## 2019-06-25 LAB — CBG MONITORING, ED: Glucose-Capillary: 91 mg/dL (ref 70–99)

## 2019-06-25 LAB — ECHOCARDIOGRAM COMPLETE

## 2019-06-25 LAB — CBC
HCT: 43.2 % (ref 39.0–52.0)
Hemoglobin: 14.3 g/dL (ref 13.0–17.0)
MCH: 27.4 pg (ref 26.0–34.0)
MCHC: 33.1 g/dL (ref 30.0–36.0)
MCV: 82.8 fL (ref 80.0–100.0)
Platelets: 217 10*3/uL (ref 150–400)
RBC: 5.22 MIL/uL (ref 4.22–5.81)
RDW: 12.5 % (ref 11.5–15.5)
WBC: 6.7 10*3/uL (ref 4.0–10.5)
nRBC: 0 % (ref 0.0–0.2)

## 2019-06-25 MED ORDER — LOSARTAN POTASSIUM 50 MG PO TABS
100.0000 mg | ORAL_TABLET | Freq: Every day | ORAL | Status: DC
  Administered 2019-06-25 – 2019-06-28 (×4): 100 mg via ORAL
  Filled 2019-06-25 (×4): qty 2

## 2019-06-25 MED ORDER — ASPIRIN EC 81 MG PO TBEC: 81.0000 mg | DELAYED_RELEASE_TABLET | Freq: Every day | ORAL | Status: DC

## 2019-06-25 MED ORDER — AMLODIPINE BESYLATE 5 MG PO TABS
5.0000 mg | ORAL_TABLET | Freq: Every day | ORAL | Status: DC
  Administered 2019-06-25 – 2019-06-28 (×4): 5 mg via ORAL
  Filled 2019-06-25 (×4): qty 1

## 2019-06-25 MED ORDER — PERFLUTREN LIPID MICROSPHERE
1.0000 mL | INTRAVENOUS | Status: AC | PRN
  Administered 2019-06-25: 12:00:00 2 mL via INTRAVENOUS
  Filled 2019-06-25: qty 10

## 2019-06-25 MED ORDER — ASPIRIN EC 325 MG PO TBEC
325.0000 mg | DELAYED_RELEASE_TABLET | Freq: Every day | ORAL | Status: DC
  Administered 2019-06-25 – 2019-06-27 (×3): 325 mg via ORAL
  Filled 2019-06-25 (×3): qty 1

## 2019-06-25 MED ORDER — HYDROCHLOROTHIAZIDE 25 MG PO TABS
25.0000 mg | ORAL_TABLET | Freq: Every day | ORAL | Status: DC
  Administered 2019-06-25 – 2019-06-28 (×4): 25 mg via ORAL
  Filled 2019-06-25 (×4): qty 1

## 2019-06-25 NOTE — Consult Note (Signed)
Physical Medicine and Rehabilitation Consult   Reason for Consult: Functional decline due to stroke/history of multiple strokes.  Referring Physician: Dr. Daryll Drown   HPI: Justin [name] is a 79 y.o. 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 He was admitted on 06/24/19 with multiple falls in the past week requiring walker, speech difficulty and weakness.  MRI brain done revealing 2 small acute infracts in R-MCA territory and advanced chronic ischemic changes with widespread atrophy advanced for age.  CTA head neck was negative for high grade occlusion or stenosis but multifocal areas of atherosclerotic irregularity. Carotid dopplers was negative for ICA stenosis. BLE dopplers negative for DVT. 2 D echo showed EF 70-75%, hyperdynamic function with grade I diastolic dysfunction and no shunt. Dr. Erlinda Hong felt that stroke was due to small vessel disease but recommended 30 day event monitor to rule out A fib. Therapy evaluations completed yesterday revealing balance deficits with right lateral lean, impaired cognition with decreased activity tolerance. CIR recommended due to functional decline.    Pt reports LBM yesterday but still feels constipated Has condom catheter currently-  Denies feeling tired or being in pain.    Review of Systems  Constitutional: Negative for chills and fever.  HENT: Negative for hearing loss.   Eyes: Negative for blurred vision.  Respiratory: Negative for cough and shortness of breath.   Cardiovascular: Negative for chest pain and palpitations.  Gastrointestinal: Negative for heartburn and nausea.  Musculoskeletal: Positive for back pain and joint pain.  Skin: Negative for rash.  Neurological: Positive for speech change, focal weakness and weakness. Negative for dizziness and headaches.  Psychiatric/Behavioral: Positive for memory loss.  All other  systems reviewed and are negative.   Past Medical History:  Diagnosis Date  . Brain bleed ([place])   . Cancer [facility])    prostate  . 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 Depression   . Diabetes mellitus without complication ([place])   . High cholesterol   . Hypertension   . Stroke [facility])     Past Surgical History:  Procedure Laterality Date  . arm & hand surgery Left 1972   skill saw accident  . COLON SURGERY     partial  . EYE SURGERY     lens implant and cateract- Left  . PROSTATECTOMY      Family History  Problem Relation Age of Onset  . CAD Mother   . Diabetes Mother   . Emphysema Father   . Diabetes Sister   . Diabetes Brother   . Diabetes Sister   . Diabetes Brother   . Diabetes Mellitus II Neg Hx     Social History:  Married. 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 Has been very sedentary for years-- watches TV most of the day.   Was independent with cane PTA. He reports that he has never smoked. He has never used smokeless tobacco. He reports that he does not drink alcohol or use drugs.    Allergies: No Known Allergies    Medications Prior to Admission  Medication Sig Dispense Refill  . atorvastatin (LIPITOR) 80 MG tablet TAKE 1 TABLET EVERY DAY (Patient taking differently: Take 80 mg by mouth daily. ) 90 tablet 0  . Cholecalciferol (VITAMIN D-1000 MAX ST) 1000 units tablet Take 1,000 Units by mouth daily.    . citalopram (CELEXA)  10 MG tablet Take 0.5 tablets (5 mg total) by mouth daily for 8 days. (Patient taking differently: Take 20 mg by mouth in the morning and at bedtime. ) 4 tablet 0  . clopidogrel (PLAVIX) 75 MG tablet Take 1 tablet (75 mg total) by mouth daily. 90 tablet 4  . cyanocobalamin ([employer] VITAMIN B12) 1000 MCG tablet Take 1,000 mcg by mouth daily.     . fenofibrate 160 MG tablet Take 160 mg by mouth daily.    . furosemide (LASIX) 20 MG tablet Take 20 mg by mouth daily.     Marland Kitchen glipiZIDE (GLUCOTROL) 10 MG tablet Take  10 mg by mouth daily.     Marland Kitchen losartan (COZAAR) 100 MG tablet Take 100 mg by mouth daily.    . metFORMIN (GLUCOPHAGE) 500 MG tablet Take 1,000 mg by mouth 2 (two) times daily with a meal.    . mirabegron ER (MYRBETRIQ) 50 MG TB24 tablet Take 50 mg by mouth daily.    . Omega-3 Fatty Acids (FISH OIL) 1000 MG CAPS Take 1,000 mg by mouth daily with breakfast.    . polyvinyl alcohol (LIQUIFILM TEARS) 1.4 % ophthalmic solution Place 1 drop into both eyes 4 (four) times daily as needed for dry eyes.    Marland Kitchen PRESCRIPTION MEDICATION Inject 3-13 Units into the skin daily. Takes 13 units in the morning and 3 units at night. Patient not sure about the name of insulin.    . Semaglutide (OZEMPIC) 0.25 or 0.5 MG/DOSE SOPN Inject 0.25 mg into the skin once a week.    . sertraline (ZOLOFT) 25 MG tablet Take 1 tablet (25 mg total) by mouth daily. 90 tablet 1    Home: Home Living Family/patient expects to be discharged to:: Private residence Living Arrangements: Spouse/significant other Available Help at Discharge: Family, Available PRN/intermittently Type of Home: House Home Access: Stairs to enter Technical brewer of Steps: 3 Entrance Stairs-Rails: Right Home Layout: One level Bathroom Shower/Tub: Multimedia programmer: Handicapped height Bathroom Accessibility: Yes Home Equipment: Engineer, manufacturing held shower head, Shower seat, Radio producer - quad, Environmental consultant - 2 wheels Additional Comments: Pt reports he lives with wife who is able to assist some   Functional History: Prior Function Level of Independence: Needs assistance Gait / Transfers Assistance Needed: Pts wife reports he just started using RW more since falling. Has had difficulty getting up out of a chair the past few days.  ADL's / Homemaking Assistance Needed: Pt reports he is able to perform ADLs mod I.  wife assists with IADLs and with medication management  Functional Status:  Mobility: Bed Mobility Overal bed mobility: Needs Assistance Bed  Mobility: Supine to Sit, Sit to Supine Supine to sit: Mod assist Sit to supine: Mod assist General bed mobility comments: Mod A for trunk assist and LE assist. REquired assist to scoot R hip towards EOB. Pt with R lateral lean in sitting and holding to bed rails.  Transfers Overall transfer level: Needs assistance Equipment used: Rolling walker (2 wheeled) Transfers: Sit to/from Stand Sit to Stand: Max assist General transfer comment: Max A to perform partial stand. Pt feet sliding forward and pt with posterior lean. Unable to come to full upright posture.       ADL: ADL Overall ADL's : Needs assistance/impaired Eating/Feeding: Set up, Bed level Grooming: Wash/dry hands, Wash/dry face, Oral care, Brushing hair, Minimal assistance, Bed level Upper Body Bathing: Sitting, Moderate assistance Lower Body Bathing: Maximal assistance, Bed level Upper Body Dressing : Maximal assistance, Sitting  Lower Body Dressing: Total assistance, Bed level Toilet Transfer: Total assistance Toilet Transfer Details (indicate cue type and reason): unable to safely perform  Toileting- Clothing Manipulation and Hygiene: Total assistance, Bed level Toileting - Clothing Manipulation Details (indicate cue type and reason): per RN, pt was in diaper and saturated with urine this am, and required total A for peri care and clean up  Functional mobility during ADLs: Maximal assistance, Moderate assistance  Cognition: Cognition Overall Cognitive Status: Impaired/Different from baseline Orientation Level: Oriented X4 Cognition Arousal/Alertness: Awake/alert Behavior During Therapy: WFL for tasks assessed/performed Overall Cognitive Status: Impaired/Different from baseline Area of Impairment: Problem solving, Following commands Following Commands: Follows one step commands with increased time Problem Solving: Slow processing, Decreased initiation, Difficulty sequencing, Requires verbal cues, Requires tactile  cues General Comments: Short Blessed Test administered.  He scored 15/28 which is indicative of significant cognitive deficit.  He demonstrated deficits with attention, organization, sequencing, and memory    Blood pressure 133/71, pulse 76, temperature 97.9 F (36.6 C), temperature source Oral, resp. rate 19, SpO2 99 %. Physical Exam  Nursing note and vitals reviewed. Constitutional: He appears well-developed and well-nourished.  Pt is an elderly male who appears stated age, sitting up in bedside chair, eating last of lunch at 12:45 pm, NAD No one else in room  HENT:  Head: Normocephalic and atraumatic.  Nose: Nose normal.  Mouth/Throat: Oropharynx is clear and moist. No oropharyngeal exudate.  Facial sensation intact B/L No facial droop seen Tongue midline  Eyes:  EOMI B/L No nystagmus   Neck: No tracheal deviation present.  Cardiovascular:  RRR- no JVD  Respiratory: No stridor.  CTA B/L- slightly coarse, but no W/R/R  GI:  Soft, NT, slightly distended, (+)BS  Genitourinary:    Genitourinary Comments: Condom catheter intact   Musculoskeletal:     Cervical back: Normal range of motion and neck supple.     Comments: RUE- biceps 4/5, triceps 4/5, WE/grip 4/5, finger abd 4-/5 LUE_ 5-/5 RLE- HF 2+/5, KE 4-/5, KF 4-/5, DF and PF 4/5 LLE- HF 3+/5, KE 4+/5, KF 4+/5, DF and PF 5-/5   Neurological: He is alert.  Oriented to self and situation--unable to state place. Has delayed processing with cognitive deficits--needed max cues from wife to answer basic questions.  Flat affect with expressive>receptive deficits. LUE weakness noted.   Was able to answer questions, but no spontaneous speech,  sensaiton intact to light touch in all 4 extremities, except stocking to mid calves B/L  Skin: Skin is warm and dry.  R elbow skinned slightly- nickel sized- dressing on it B/L IVs in arms- look OK- no infiltrate.   Psychiatric:  Vague, a little confused, Ox2; pleasant; low energy     Results for orders placed or performed during the hospital encounter of 06/24/19 (from the past 24 hour(s))  Hemoglobin A1c     Status: Abnormal   Collection Time: 06/25/19  9:52 AM  Result Value Ref Range   Hgb A1c MFr Bld 7.2 (H) 4.8 - 5.6 %   Mean Plasma Glucose 159.94 mg/dL  Glucose, capillary     Status: None   Collection Time: 06/25/19  1:24 PM  Result Value Ref Range   Glucose-Capillary 86 70 - 99 mg/dL   Comment 1 Notify RN    Comment 2 Document in Chart   Glucose, capillary     Status: Abnormal   Collection Time: 06/25/19  4:10 PM  Result Value Ref Range   Glucose-Capillary 188 (H) 70 -  99 mg/dL   Comment 1 Notify RN    Comment 2 Document in Chart   Glucose, capillary     Status: Abnormal   Collection Time: 06/25/19  9:25 PM  Result Value Ref Range   Glucose-Capillary 168 (H) 70 - 99 mg/dL   Comment 1 Notify RN    Comment 2 Document in Chart   CBC     Status: None   Collection Time: 06/26/19  2:31 AM  Result Value Ref Range   WBC 9.1 4.0 - 10.5 K/uL   RBC 5.39 4.22 - 5.81 MIL/uL   Hemoglobin 15.2 13.0 - 17.0 g/dL   HCT 45.1 39.0 - 52.0 %   MCV 83.7 80.0 - 100.0 fL   MCH 28.2 26.0 - 34.0 pg   MCHC 33.7 30.0 - 36.0 g/dL   RDW 12.7 11.5 - 15.5 %   Platelets 252 150 - 400 K/uL   nRBC 0.0 0.0 - 0.2 %  Basic metabolic panel     Status: Abnormal   Collection Time: 06/26/19  2:31 AM  Result Value Ref Range   Sodium 138 135 - 145 mmol/L   Potassium 3.7 3.5 - 5.1 mmol/L   Chloride 102 98 - 111 mmol/L   CO2 26 22 - 32 mmol/L   Glucose, Bld 128 (H) 70 - 99 mg/dL   BUN 15 8 - 23 mg/dL   Creatinine, Ser 1.28 (H) 0.61 - 1.24 mg/dL   Calcium 8.9 8.9 - 10.3 mg/dL   GFR calc non Af Amer 54 (L) >60 mL/min   GFR calc Af Amer >60 >60 mL/min   Anion gap 10 5 - 15  Glucose, capillary     Status: Abnormal   Collection Time: 06/26/19  6:05 AM  Result Value Ref Range   Glucose-Capillary 125 (H) 70 - 99 mg/dL   Comment 1 Notify RN    Comment 2 Document in Chart    CT  ANGIO HEAD W OR WO CONTRAST  Result Date: 06/24/2019 CLINICAL DATA:  Stroke follow-up EXAM: CT ANGIOGRAPHY HEAD TECHNIQUE: Multidetector CT imaging of the head was performed using the standard protocol during bolus administration of intravenous contrast. Multiplanar CT image reconstructions and MIPs were obtained to evaluate the vascular anatomy. CONTRAST:  32mL OMNIPAQUE IOHEXOL 350 MG/ML SOLN COMPARISON:  None. FINDINGS: POSTERIOR CIRCULATION: --Vertebral arteries: Normal V4 segments. --Posterior inferior cerebellar arteries (PICA): Patent origins from the vertebral arteries. --Anterior inferior cerebellar arteries (AICA): Patent origins from the basilar artery. --Basilar artery: Normal. --Superior cerebellar arteries: Normal. --Posterior cerebral arteries: Multifocal irregularity without occlusion. ANTERIOR CIRCULATION: --Intracranial internal carotid arteries: Atherosclerotic calcification without high-grade stenosis. --Anterior cerebral arteries (ACA): Patent with multiple areas of atherosclerotic irregularity. --Middle cerebral arteries (MCA): Patent multifocal atherosclerotic irregularity. Venous sinuses: As permitted by contrast timing, patent. Anatomic variants: Left P-comm IMPRESSION: 1. No intracranial arterial occlusion or high-grade stenosis. 2. Multifocal atherosclerotic irregularity of the intracranial arteries. Electronically Signed   By: Ulyses Jarred M.D.   On: 06/24/2019 21:28   DG Chest 2 View  Result Date: 06/24/2019 CLINICAL DATA:  Multiple falls EXAM: CHEST - 2 VIEW COMPARISON:  04/22/2013 chest radiograph. FINDINGS: Stable cardiomediastinal silhouette with normal heart size. No pneumothorax. No pleural effusion. Lungs appear clear, with no acute consolidative airspace disease and no pulmonary edema. No displaced fractures in the visualized chest. IMPRESSION: No active cardiopulmonary disease. Electronically Signed   By: Ilona Sorrel M.D.   On: 06/24/2019 10:55   CT HEAD WO  CONTRAST  Result Date: 06/24/2019  CLINICAL DATA:  Multiple falls in past few days, unable to walk safely, LEFT hip pain and bruising, knee pain, on blood thinners, question stroke, history prostate cancer, hypertension, diabetes mellitus, stroke EXAM: CT HEAD WITHOUT CONTRAST TECHNIQUE: Contiguous axial images were obtained from the base of the skull through the vertex without intravenous contrast. Sagittal and coronal MPR images reconstructed from axial data set. COMPARISON:  10/03/2018 FINDINGS: Brain: Generalized atrophy. Ex vacuo dilatation of the LEFT lateral ventricle. No midline shift or mass effect. Small vessel chronic ischemic changes of deep cerebral white matter. Old infarct anterior LEFT basal ganglia and periventricular white matter. Small old RIGHT basal ganglia and RIGHT thalamic infarcts. No intracranial hemorrhage, mass lesion, or evidence of acute infarction. No extra-axial fluid collections. Vascular: No hyperdense vessels. Mild atherosclerotic calcification of internal carotid arteries bilaterally at skull base Skull: Intact Sinuses/Orbits: Clear Other: N/A IMPRESSION: Atrophy with small vessel chronic ischemic changes of deep cerebral white matter. Old BILATERAL basal ganglia and RIGHT thalamic lacunar infarcts as well as old LEFT frontal periventricular white matter infarct. No acute intracranial abnormalities. Electronically Signed   By: Lavonia Dana M.D.   On: 06/24/2019 10:49   MR BRAIN WO CONTRAST  Result Date: 06/24/2019 CLINICAL DATA:  Suspected stroke. Multiple recent falls. History of prostate carcinoma. EXAM: MRI HEAD WITHOUT CONTRAST TECHNIQUE: Multiplanar, multiecho pulse sequences of the brain and surrounding structures were obtained without intravenous contrast. COMPARISON:  None. FINDINGS: Brain: Multifocal abnormal diffusion restriction within the right MCA territory, at the centrum semiovale and posterior corona radiata. No contralateral diffusion abnormality. No acute  hemorrhage. There are multiple old small vessel infarcts and white matter changes of advanced chronic ischemic microangiopathy. There is widespread atrophy greater than expected for age. The midline structures are normal. There are some areas of magnetic susceptibility effect associated with the older infarcts consistent with remote hemorrhage. Vascular: Normal flow voids Skull and upper cervical spine: Normal marrow signal Sinuses/Orbits: Paranasal sinuses and mastoids are clear. Normal orbits. Other: None IMPRESSION: 1. There are 2 small acute infarcts within the right MCA territory, at the centrum semiovale and posterior corona radiata. No acute hemorrhage or mass effect. 2. Findings of advanced chronic ischemic microangiopathy and widespread atrophy greater than expected for age. Electronically Signed   By: Ulyses Jarred M.D.   On: 06/24/2019 19:44   DG Knee Complete 4 Views Left  Result Date: 06/24/2019 CLINICAL DATA:  Multiple falls. Left knee pain. EXAM: LEFT KNEE - COMPLETE 4+ VIEW COMPARISON:  None. FINDINGS: No fracture, joint effusion or dislocation. No focal osseous lesions. Mild tricompartmental osteoarthritis, most prominent in the patellofemoral compartment. No radiopaque foreign bodies. IMPRESSION: No left knee fracture, joint effusion or dislocation. Mild tricompartmental left knee osteoarthritis, most prominent in the patellofemoral compartment. Electronically Signed   By: Ilona Sorrel M.D.   On: 06/24/2019 10:59   ECHOCARDIOGRAM COMPLETE  Result Date: 06/25/2019    ECHOCARDIOGRAM REPORT   Patient Name:   Justin [name] Date of Exam: 06/25/2019 Medical Rec #:  [id number]     Height:       72.0 in Accession #:    [id number]    Weight:       238.6 lb Date of Birth:  November 17, 1940    BSA:          2.296 m Patient Age:    81 years      BP:           162/76 mmHg Patient Gender: M  HR:           60 bpm. Exam Location:  Inpatient Procedure: 2D Echo and Intracardiac Opacification Agent  Indications:    Stroke 434.91 / I163.9  History:        Patient has prior history of Echocardiogram examinations, most                 recent 10/04/2018. Risk Factors:Hypertension, Diabetes and                 Dyslipidemia. History of cancer.  Sonographer:    Darlina Sicilian RDCS Referring Phys: [place]  1. Left ventricular ejection fraction, by estimation, is 70 to 75%. The left ventricle has hyperdynamic function. The left ventricle has no regional wall motion abnormalities. Left ventricular diastolic parameters are consistent with Grade I diastolic dysfunction (impaired relaxation).  2. Right ventricular systolic function is normal. The right ventricular size is normal.  3. The mitral valve is normal in structure. No evidence of mitral valve regurgitation. No evidence of mitral stenosis.  4. The aortic valve is normal in structure. Aortic valve regurgitation is not visualized. No aortic stenosis is present.  5. The inferior vena cava is normal in size with greater than 50% respiratory variability, suggesting right atrial pressure of 3 mmHg. Conclusion(s)/Recommendation(s): No intracardiac source of embolism detected on this transthoracic study. A transesophageal echocardiogram is recommended to exclude cardiac source of embolism if clinically indicated. FINDINGS  Left Ventricle: Left ventricular ejection fraction, by estimation, is 70 to 75%. The left ventricle has hyperdynamic function. The left ventricle has no regional wall motion abnormalities. Definity contrast agent was given IV to delineate the left ventricular endocardial borders. The left ventricular internal cavity size was normal in size. There is no left ventricular hypertrophy. Left ventricular diastolic parameters are consistent with Grade I diastolic dysfunction (impaired relaxation). Right Ventricle: The right ventricular size is normal. No increase in right ventricular wall thickness. Right ventricular systolic function is  normal. Left Atrium: Left atrial size was normal in size. Right Atrium: Right atrial size was normal in size. Pericardium: There is no evidence of pericardial effusion. Mitral Valve: The mitral valve is normal in structure. Normal mobility of the mitral valve leaflets. No evidence of mitral valve regurgitation. No evidence of mitral valve stenosis. Tricuspid Valve: The tricuspid valve is normal in structure. Tricuspid valve regurgitation is not demonstrated. No evidence of tricuspid stenosis. Aortic Valve: The aortic valve is normal in structure. Aortic valve regurgitation is not visualized. No aortic stenosis is present. Pulmonic Valve: The pulmonic valve was normal in structure. Pulmonic valve regurgitation is not visualized. No evidence of pulmonic stenosis. Aorta: The aortic root is normal in size and structure. Venous: The inferior vena cava is normal in size with greater than 50% respiratory variability, suggesting right atrial pressure of 3 mmHg. IAS/Shunts: No atrial level shunt detected by color flow Doppler.  LEFT VENTRICLE PLAX 2D LVIDd:         4.78 cm      Diastology LVIDs:         2.91 cm      LV e' lateral:   3.26 cm/s LV PW:         0.86 cm      LV E/e' lateral: 17.5 LV IVS:        1.14 cm      LV e' medial:    4.68 cm/s LVOT diam:     1.70 cm  LV E/e' medial:  12.2 LV SV:         33 LV SV Index:   14 LVOT Area:     2.27 cm  LV Volumes (MOD) LV vol d, MOD A2C: 117.0 ml LV vol d, MOD A4C: 109.0 ml LV vol s, MOD A2C: 42.3 ml LV vol s, MOD A4C: 31.3 ml LV SV MOD A2C:     74.7 ml LV SV MOD A4C:     109.0 ml LV SV MOD BP:      79.0 ml RIGHT VENTRICLE RV S prime:     11.20 cm/s TAPSE (M-mode): 1.6 cm LEFT ATRIUM             Index LA diam:        2.70 cm 1.18 cm/m LA Vol (A2C):   42.1 ml 18.33 ml/m LA Vol (A4C):   45.4 ml 19.77 ml/m LA Biplane Vol: 43.6 ml 18.99 ml/m  AORTIC VALVE LVOT Vmax:   82.90 cm/s LVOT Vmean:  46.200 cm/s LVOT VTI:    0.146 m  AORTA Ao Root diam: 3.30 cm MITRAL VALVE MV Area  (PHT): 2.95 cm    SHUNTS MV Decel Time: 257 msec    Systemic VTI:  0.15 m MV E velocity: 57.00 cm/s  Systemic Diam: 1.70 cm MV A velocity: 66.40 cm/s MV E/A ratio:  0.86 Candee Furbish MD Electronically signed by Candee Furbish MD Signature Date/Time: 06/25/2019/12:59:19 PM    Final    DG Hip Unilat W or Wo Pelvis 2-3 Views Left  Result Date: 06/24/2019 CLINICAL DATA:  Multiple falls, left hip pain EXAM: DG HIP (WITH OR WITHOUT PELVIS) 2-3V LEFT COMPARISON:  None. FINDINGS: No left hip fracture or dislocation. No pelvic fracture or diastasis. No focal osseous lesions. Degenerative changes in the visualized lower lumbar spine. No radiopaque foreign bodies. No significant left hip arthropathy. IMPRESSION: No fracture. No left hip malalignment. Electronically Signed   By: Ilona Sorrel M.D.   On: 06/24/2019 10:58   VAS US CAROTID (at [facility] and [facility] only)  Result Date: 06/26/2019 Carotid Arterial Duplex Study Indications:       Weakness and unsteadiness and falls at home. Risk Factors:      Hypertension, Diabetes, prior CVA. Comparison Study:  Carotid study on 09/18/2017 - 1-39% stenosis. Performing Technologist: Oda Cogan RDMS, RVT  Examination Guidelines: A complete evaluation includes B-mode imaging, spectral Doppler, color Doppler, and power Doppler as needed of all accessible portions of each vessel. Bilateral testing is considered an integral part of a complete examination. Limited examinations for reoccurring indications may be performed as noted.  Right Carotid Findings: +----------+--------+--------+--------+------------------+------------------+           PSV cm/sEDV cm/sStenosisPlaque DescriptionComments           +----------+--------+--------+--------+------------------+------------------+ CCA Prox  58      14                                                   +----------+--------+--------+--------+------------------+------------------+ CCA Distal56      14                                 intimal thickening +----------+--------+--------+--------+------------------+------------------+ ICA Prox  59      17      1-39%  intimal thickening +----------+--------+--------+--------+------------------+------------------+ ICA Distal56      21                                                   +----------+--------+--------+--------+------------------+------------------+ ECA       112     19                                                   +----------+--------+--------+--------+------------------+------------------+ +----------+--------+-------+----------------+-------------------+           PSV cm/sEDV cmsDescribe        Arm Pressure (mmHG) +----------+--------+-------+----------------+-------------------+ [id number]             Multiphasic, WNL                    +----------+--------+-------+----------------+-------------------+ +---------+--------+--+--------+--+---------+ VertebralPSV cm/s56EDV cm/s16Antegrade +---------+--------+--+--------+--+---------+  Left Carotid Findings: +----------+--------+--------+--------+------------------+------------------+           PSV cm/sEDV cm/sStenosisPlaque DescriptionComments           +----------+--------+--------+--------+------------------+------------------+ CCA Prox  97      21                                                   +----------+--------+--------+--------+------------------+------------------+ CCA Distal72      17                                intimal thickening +----------+--------+--------+--------+------------------+------------------+ ICA Prox  86      20      1-39%   calcific                             +----------+--------+--------+--------+------------------+------------------+ ICA Distal53      18                                                   +----------+--------+--------+--------+------------------+------------------+ ECA       87      11                                                    +----------+--------+--------+--------+------------------+------------------+ +----------+--------+--------+----------------+-------------------+           PSV cm/sEDV cm/sDescribe        Arm Pressure (mmHG) +----------+--------+--------+----------------+-------------------+ Subclavian110             Multiphasic, WNL                    +----------+--------+--------+----------------+-------------------+ +---------+--------+--+--------+--+---------+ VertebralPSV cm/s41EDV cm/s15Antegrade +---------+--------+--+--------+--+---------+   Summary: Right Carotid: Velocities in the right ICA are consistent with a 1-39% stenosis. Left Carotid: Velocities in the left ICA are consistent with a 1-39% stenosis. Vertebrals:  Bilateral vertebral arteries demonstrate antegrade flow. Subclavians:  Normal flow hemodynamics were seen in bilateral subclavian              arteries. *See table(s) above for measurements and observations.  Electronically signed by Antony Contras MD on 06/26/2019 at 8:52:19 AM.    Final    VAS [place] LOWER EXTREMITY VENOUS (DVT)  Result Date: 06/25/2019  Lower Venous DVTStudy Indications: Edema. Other Indications: LLE weakness and left knee and hip pain. Performing Technologist: Oda Cogan RDMS, RVT  Examination Guidelines: A complete evaluation includes B-mode imaging, spectral Doppler, color Doppler, and power Doppler as needed of all accessible portions of each vessel. Bilateral testing is considered an integral part of a complete examination. Limited examinations for reoccurring indications may be performed as noted. The reflux portion of the exam is performed with the patient in reverse Trendelenburg.  +---------+---------------+---------+-----------+----------+--------------+ RIGHT    CompressibilityPhasicitySpontaneityPropertiesThrombus Aging +---------+---------------+---------+-----------+----------+--------------+ CFV       Full           Yes      Yes                                 +---------+---------------+---------+-----------+----------+--------------+ SFJ      Full                                                        +---------+---------------+---------+-----------+----------+--------------+ FV Prox  Full                                                        +---------+---------------+---------+-----------+----------+--------------+ FV Mid   Full                                                        +---------+---------------+---------+-----------+----------+--------------+ FV DistalFull                                                        +---------+---------------+---------+-----------+----------+--------------+ PFV      Full                                                        +---------+---------------+---------+-----------+----------+--------------+ POP      Full           Yes      Yes                                 +---------+---------------+---------+-----------+----------+--------------+ PTV      Full                                                        +---------+---------------+---------+-----------+----------+--------------+  PERO     Full                                                        +---------+---------------+---------+-----------+----------+--------------+   +----+---------------+---------+-----------+----------+--------------+ LEFTCompressibilityPhasicitySpontaneityPropertiesThrombus Aging +----+---------------+---------+-----------+----------+--------------+ CFV Full           Yes      Yes                                 +----+---------------+---------+-----------+----------+--------------+ SFJ Full                                                        +----+---------------+---------+-----------+----------+--------------+     Summary: RIGHT: - There is no evidence of deep vein thrombosis in the lower extremity.   - No cystic structure found in the popliteal fossa.  LEFT: - No evidence of common femoral vein obstruction.  *See table(s) above for measurements and observations. Electronically signed by Ruta Hinds MD on 06/25/2019 at 3:44:56 PM.    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 Does the need for close, 24 hr/day medical supervision in concert with the patient's rehab needs make it unreasonable for this patient to be served in a less intensive setting? Potentially 2. 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 Due to bladder management, bowel management, safety, skin/wound care, disease management, medication administration, pain management and patient education, does the patient require 24 hr/day rehab nursing? Yes 4. Does the patient require coordinated care of a physician, rehab nurse, therapy disciplines of PT, OT and SLP to address physical and functional deficits in the context of the above medical diagnosis(es)? Yes Addressing deficits in the following areas: balance, endurance, locomotion, strength, transferring, bathing, dressing, feeding, grooming, toileting, cognition, speech and language 5. Can the patient actively participate in an intensive therapy program of at least 3 hrs of therapy per day at least 5 days per week? Yes 6. The potential for patient to make measurable gains while on inpatient rehab is fair 7. Anticipated functional outcomes upon discharge from inpatient rehab are supervision and min assist  with PT, supervision and min assist with OT, supervision and min assist with SLP. 8. Estimated rehab length of stay to reach the above functional goals is: 7-10 days 9. Anticipated discharge destination: Home 10. Overall Rehab/Functional Prognosis: fair  RECOMMENDATIONS: This patient's condition is appropriate  for continued rehabilitative care in the following setting: CIR Patient has agreed to participate in recommended program. Potentially Note that insurance prior authorization may be required for reimbursement for recommended care.  Comment:  1. Patient cognitively is unable ot decide, I think if appropriate for CIR- but he did know CIR was "upstairs". 2. Wife stated per PA pt is low level at home- mainly sits in recliner and watching TV- he is participating well with therapy, but Cognitive issues are impairing his ability to participate somewhat- will need SLP, but knowing has previous TBI based on chart.  3. Suggest  working on constipation- increase bowel meds- suggest senokot 4. Pt is an appropriate candidate for short term CIR to get back closer to baseline- wife said would take him home.  5. Thank you for this consult- will have admission coordinators see pt.       Bary Leriche, PA-C 06/26/2019   I have personally performed a face to face diagnostic evaluation of this patient and formulated the key components of the plan.  Additionally, I have personally reviewed laboratory data, imaging studies, as well as relevant notes and concur with the physician assistant's documentation above.

## 2019-06-25 NOTE — Progress Notes (Signed)
  Echocardiogram 2D Echocardiogram with definity has been performed.  Darlina Sicilian M 06/25/2019, 11:46 AM

## 2019-06-25 NOTE — Evaluation (Signed)
Physical Therapy Evaluation Patient Details Name: Justin [name] MRN: [id number] DOB: Apr 10, 1940 Today's Date: 06/25/2019   History of Present Illness  This 79 y.o. male admitted with increased number of falls over the past few weeks.  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  PMH includes:  HTN, DM, depression, CA, h/o brain bleed   Clinical Impression  Pt admitted secondary to problem above with deficits below. Pt with posterior and R lateral lean in sitting. Attempted to stand, however, unable to achieve full upright secondary to posterior lean. Required mod to max A for all mobility. Pt has had multiple falls at home. Feel pt would benefit from CIR level therapies at d/c to increase independence and safety with functional mobility. Will continue to follow acutely.     Follow Up Recommendations CIR    Equipment Recommendations  Other (comment)(TBD)    Recommendations for Other Services Rehab consult     Precautions / Restrictions Precautions Precautions: Fall Precaution Comments: posterior  and R lateral lean and h/o falls Restrictions Weight Bearing Restrictions: No      Mobility  Bed Mobility Overal bed mobility: Needs Assistance Bed Mobility: Supine to Sit;Sit to Supine     Supine to sit: Mod assist Sit to supine: Mod assist   General bed mobility comments: Mod A for trunk assist and LE assist. REquired assist to scoot R hip towards EOB. Pt with R lateral lean in sitting and holding to bed rails.   Transfers Overall transfer level: Needs assistance Equipment used: Rolling walker (2 wheeled) Transfers: Sit to/from Stand Sit to Stand: Max assist         General transfer comment: Max A to perform partial stand. Pt feet sliding forward and pt with posterior lean. Unable to come to  full upright posture.   Ambulation/Gait                Stairs            Wheelchair Mobility    Modified Rankin (Stroke Patients Only) Modified Rankin (Stroke Patients Only) Pre-Morbid Rankin Score: No symptoms Modified Rankin: Moderately severe disability     Balance Overall balance assessment: Needs assistance Sitting-balance support: Feet unsupported;Bilateral upper extremity supported Sitting balance-Leahy Scale: Poor Sitting balance - Comments: Reliant on BUE support; R lateral and posterior lean.  Postural control: Posterior lean;Right lateral lean Standing balance support: Bilateral upper extremity supported Standing balance-Leahy Scale: Zero Standing balance comment: Unable to come up to full upright. Posterior lean in standing                             Pertinent Vitals/Pain Pain Assessment: No/denies pain    Home Living Family/patient expects to be discharged to:: Private residence Living Arrangements: Spouse/significant other Available Help at Discharge: Family;Available PRN/intermittently Type of Home: House Home Access: Stairs to enter Entrance Stairs-Rails: Right Entrance Stairs-Number of Steps: 3 Home Layout: One level Home Equipment: Hand held shower head;Shower seat;Cane - quad;Walker - 2 wheels      Prior Function Level of Independence: Needs assistance   Gait / Transfers Assistance Needed: Pts wife reports he just started using RW more since falling. Has had difficulty getting up out of a chair the past few days.   ADL's / Homemaking Assistance Needed: Pt reports he is  able to perform ADLs mod I.  wife assists with IADLs and with medication management         Hand Dominance   Dominant Hand: Right    Extremity/Trunk Assessment   Upper Extremity Assessment Upper Extremity Assessment: Defer to OT evaluation    Lower Extremity Assessment Lower Extremity Assessment: Generalized weakness    Cervical / Trunk  Assessment Cervical / Trunk Assessment: Normal  Communication   Communication: Expressive difficulties(mild dysarthria and low volume)  Cognition Arousal/Alertness: Awake/alert Behavior During Therapy: WFL for tasks assessed/performed Overall Cognitive Status: Impaired/Different from baseline Area of Impairment: Problem solving;Following commands                       Following Commands: Follows one step commands with increased time     Problem Solving: Slow processing;Decreased initiation;Difficulty sequencing;Requires verbal cues;Requires tactile cues        General Comments General comments (skin integrity, edema, etc.): [name] present during session    Exercises     Assessment/Plan    PT Assessment Patient needs continued PT services  PT Problem List Decreased strength;Decreased balance;Decreased mobility;Decreased knowledge of use of DME;Decreased cognition;Decreased safety awareness;Decreased knowledge of precautions       PT Treatment Interventions Stair training;Gait training;DME instruction;Functional mobility training;Therapeutic activities;Therapeutic exercise;Balance training;Patient/family education;Cognitive remediation;Neuromuscular re-education    PT Goals (Current goals can be found in the Care Plan section)  Acute Rehab PT Goals Patient Stated Goal: to not fall  PT Goal Formulation: With patient Time For Goal Achievement: 07/09/19 Potential to Achieve Goals: Good    Frequency Min 4X/week   Barriers to discharge        Co-evaluation               AM-PAC PT "6 Clicks" Mobility  Outcome Measure Help needed turning from your back to your side while in a flat bed without using bedrails?: A Lot Help needed moving from lying on your back to sitting on the side of a flat bed without using bedrails?: A Lot Help needed moving to and from a bed to a chair (including a wheelchair)?: Total Help needed standing up from a chair using your arms  (e.g., wheelchair or bedside chair)?: Total Help needed to walk in hospital room?: Total Help needed climbing 3-5 steps with a railing? : Total 6 Click Score: 8    End of Session Equipment Utilized During Treatment: Gait belt Activity Tolerance: Patient tolerated treatment well Patient left: in bed;with call bell/phone within reach;with bed alarm set;with family/visitor present Nurse Communication: Mobility status PT Visit Diagnosis: Unsteadiness on feet (R26.81);History of falling (Z91.81);Repeated falls (R29.6);Muscle weakness (generalized) (M62.81)    Time: [id number] PT Time Calculation (min) (ACUTE ONLY): 27 min   Charges:   PT Evaluation $PT Eval Moderate Complexity: 1 Mod PT Treatments $Therapeutic Activity: 8-22 mins        Lou Miner, DPT  Acute Rehabilitation Services  Pager: [phone] Office: [phone]   Rudean Hitt 06/25/2019, 6:12 PM

## 2019-06-25 NOTE — ED Notes (Signed)
Pt saturated in urine. Full linen change and condom cath applied

## 2019-06-25 NOTE — Progress Notes (Signed)
STROKE TEAM PROGRESS NOTE   INTERVAL HISTORY No family is at the bedside.  Pt lying in bed comfortably. He denies arm leg numbness or weakness, but he did endorse he had multiple falls recently.  MRI showed right semiovale and MCA/ACA border zone 2 separate infarcts.  Carotid Doppler unremarkable.  No DVT.  EF 70 to 75%.  BP stable.  A1c 7.2.  Recommended 30-day CardioNet monitoring and patient is in agreement.  He denies any heart palpitation or racing heart.  Vitals:   06/25/19 0930 06/25/19 1225 06/25/19 1322 06/25/19 1721  BP: (!) 162/76 (!) 153/81 (!) 162/85 (!) 142/78  Pulse: 60 68 63 70  Resp: 16 16 19 16   Temp:  98.6 F (37 C) 98.2 F (36.8 C) 98.2 F (36.8 C)  TempSrc:  Oral Oral Oral  SpO2: 100% 100% 100% 98%    CBC:  Recent Labs  Lab 06/24/19 1130 06/25/19 0415  WBC 7.3 6.7  NEUTROABS 5.1  --   HGB 14.8 14.3  HCT 44.5 43.2  MCV 82.7 82.8  PLT 245 [id number]    Basic Metabolic Panel:  Recent Labs  Lab 06/24/19 1130 06/25/19 0415  NA 138 138  K 3.8 3.6  CL 101 106  CO2 27 26  GLUCOSE 158* 166*  BUN 17 11  CREATININE 0.93 0.97  CALCIUM 8.9 8.5*   Lipid Panel:     Component Value Date/Time   CHOL 103 06/25/2019 0415   TRIG 136 06/25/2019 0415   HDL 28 (L) 06/25/2019 0415   CHOLHDL 3.7 06/25/2019 0415   VLDL 27 06/25/2019 0415   LDLCALC 48 06/25/2019 0415   HgbA1c:  Lab Results  Component Value Date   HGBA1C 7.2 (H) 06/25/2019   Urine Drug Screen:     Component Value Date/Time   LABOPIA NONE DETECTED 06/24/2019 1131   COCAINSCRNUR NONE DETECTED 06/24/2019 1131   LABBENZ NONE DETECTED 06/24/2019 1131   AMPHETMU NONE DETECTED 06/24/2019 1131   THCU NONE DETECTED 06/24/2019 1131   LABBARB NONE DETECTED 06/24/2019 1131    Alcohol Level     Component Value Date/Time   ETH <10 06/24/2019 1130    IMAGING past 24 hours CT ANGIO HEAD W OR WO CONTRAST  Result Date: 06/24/2019 CLINICAL DATA:  Stroke follow-up EXAM: CT ANGIOGRAPHY HEAD TECHNIQUE:  Multidetector CT imaging of the head was performed using the standard protocol during bolus administration of intravenous contrast. Multiplanar CT image reconstructions and MIPs were obtained to evaluate the vascular anatomy. CONTRAST:  65mL OMNIPAQUE IOHEXOL 350 MG/ML SOLN COMPARISON:  None. FINDINGS: POSTERIOR CIRCULATION: --Vertebral arteries: Normal V4 segments. --Posterior inferior cerebellar arteries (PICA): Patent origins from the vertebral arteries. --Anterior inferior cerebellar arteries (AICA): Patent origins from the basilar artery. --Basilar artery: Normal. --Superior cerebellar arteries: Normal. --Posterior cerebral arteries: Multifocal irregularity without occlusion. ANTERIOR CIRCULATION: --Intracranial internal carotid arteries: Atherosclerotic calcification without high-grade stenosis. --Anterior cerebral arteries (ACA): Patent with multiple areas of atherosclerotic irregularity. --Middle cerebral arteries (MCA): Patent multifocal atherosclerotic irregularity. Venous sinuses: As permitted by contrast timing, patent. Anatomic variants: Left P-comm IMPRESSION: 1. No intracranial arterial occlusion or high-grade stenosis. 2. Multifocal atherosclerotic irregularity of the intracranial arteries. Electronically Signed   By: Ulyses Jarred M.D.   On: 06/24/2019 21:28   MR BRAIN WO CONTRAST  Result Date: 06/24/2019 CLINICAL DATA:  Suspected stroke. Multiple recent falls. History of prostate carcinoma. EXAM: MRI HEAD WITHOUT CONTRAST TECHNIQUE: Multiplanar, multiecho pulse sequences of the brain and surrounding structures were obtained without intravenous contrast. COMPARISON:  None. FINDINGS: Brain: Multifocal abnormal diffusion restriction within the right MCA territory, at the centrum semiovale and posterior corona radiata. No contralateral diffusion abnormality. No acute hemorrhage. There are multiple old small vessel infarcts and white matter changes of advanced chronic ischemic microangiopathy. There  is widespread atrophy greater than expected for age. The midline structures are normal. There are some areas of magnetic susceptibility effect associated with the older infarcts consistent with remote hemorrhage. Vascular: Normal flow voids Skull and upper cervical spine: Normal marrow signal Sinuses/Orbits: Paranasal sinuses and mastoids are clear. Normal orbits. Other: None IMPRESSION: 1. There are 2 small acute infarcts within the right MCA territory, at the centrum semiovale and posterior corona radiata. No acute hemorrhage or mass effect. 2. Findings of advanced chronic ischemic microangiopathy and widespread atrophy greater than expected for age. Electronically Signed   By: Ulyses Jarred M.D.   On: 06/24/2019 19:44   ECHOCARDIOGRAM COMPLETE  Result Date: 06/25/2019    ECHOCARDIOGRAM REPORT   Patient Name:   Justin [name] Date of Exam: 06/25/2019 Medical Rec #:  [id number]     Height:       72.0 in Accession #:    [id number]    Weight:       238.6 lb Date of Birth:  19-Jan-1941    BSA:          2.296 m Patient Age:    79 years      BP:           162/76 mmHg Patient Gender: M             HR:           60 bpm. Exam Location:  Inpatient Procedure: 2D Echo and Intracardiac Opacification Agent Indications:    Stroke 434.91 / I163.9  History:        Patient has prior history of Echocardiogram examinations, most                 recent 10/04/2018. Risk Factors:Hypertension, Diabetes and                 Dyslipidemia. History of cancer.  Sonographer:    Darlina Sicilian RDCS Referring Phys: [place]  1. Left ventricular ejection fraction, by estimation, is 70 to 75%. The left ventricle has hyperdynamic function. The left ventricle has no regional wall motion abnormalities. Left ventricular diastolic parameters are consistent with Grade I diastolic dysfunction (impaired relaxation).  2. Right ventricular systolic function is normal. The right ventricular size is normal.  3. The mitral valve is normal in  structure. No evidence of mitral valve regurgitation. No evidence of mitral stenosis.  4. The aortic valve is normal in structure. Aortic valve regurgitation is not visualized. No aortic stenosis is present.  5. The inferior vena cava is normal in size with greater than 50% respiratory variability, suggesting right atrial pressure of 3 mmHg. Conclusion(s)/Recommendation(s): No intracardiac source of embolism detected on this transthoracic study. A transesophageal echocardiogram is recommended to exclude cardiac source of embolism if clinically indicated. FINDINGS  Left Ventricle: Left ventricular ejection fraction, by estimation, is 70 to 75%. The left ventricle has hyperdynamic function. The left ventricle has no regional wall motion abnormalities. Definity contrast agent was given IV to delineate the left ventricular endocardial borders. The left ventricular internal cavity size was normal in size. There is no left ventricular hypertrophy. Left ventricular diastolic parameters are consistent with Grade I diastolic dysfunction (impaired relaxation). Right Ventricle: The right ventricular  size is normal. No increase in right ventricular wall thickness. Right ventricular systolic function is normal. Left Atrium: Left atrial size was normal in size. Right Atrium: Right atrial size was normal in size. Pericardium: There is no evidence of pericardial effusion. Mitral Valve: The mitral valve is normal in structure. Normal mobility of the mitral valve leaflets. No evidence of mitral valve regurgitation. No evidence of mitral valve stenosis. Tricuspid Valve: The tricuspid valve is normal in structure. Tricuspid valve regurgitation is not demonstrated. No evidence of tricuspid stenosis. Aortic Valve: The aortic valve is normal in structure. Aortic valve regurgitation is not visualized. No aortic stenosis is present. Pulmonic Valve: The pulmonic valve was normal in structure. Pulmonic valve regurgitation is not visualized.  No evidence of pulmonic stenosis. Aorta: The aortic root is normal in size and structure. Venous: The inferior vena cava is normal in size with greater than 50% respiratory variability, suggesting right atrial pressure of 3 mmHg. IAS/Shunts: No atrial level shunt detected by color flow Doppler.  LEFT VENTRICLE PLAX 2D LVIDd:         4.78 cm      Diastology LVIDs:         2.91 cm      LV e' lateral:   3.26 cm/s LV PW:         0.86 cm      LV E/e' lateral: 17.5 LV IVS:        1.14 cm      LV e' medial:    4.68 cm/s LVOT diam:     1.70 cm      LV E/e' medial:  12.2 LV SV:         33 LV SV Index:   14 LVOT Area:     2.27 cm  LV Volumes (MOD) LV vol d, MOD A2C: 117.0 ml LV vol d, MOD A4C: 109.0 ml LV vol s, MOD A2C: 42.3 ml LV vol s, MOD A4C: 31.3 ml LV SV MOD A2C:     74.7 ml LV SV MOD A4C:     109.0 ml LV SV MOD BP:      79.0 ml RIGHT VENTRICLE RV S prime:     11.20 cm/s TAPSE (M-mode): 1.6 cm LEFT ATRIUM             Index LA diam:        2.70 cm 1.18 cm/m LA Vol (A2C):   42.1 ml 18.33 ml/m LA Vol (A4C):   45.4 ml 19.77 ml/m LA Biplane Vol: 43.6 ml 18.99 ml/m  AORTIC VALVE LVOT Vmax:   82.90 cm/s LVOT Vmean:  46.200 cm/s LVOT VTI:    0.146 m  AORTA Ao Root diam: 3.30 cm MITRAL VALVE MV Area (PHT): 2.95 cm    SHUNTS MV Decel Time: 257 msec    Systemic VTI:  0.15 m MV E velocity: 57.00 cm/s  Systemic Diam: 1.70 cm MV A velocity: 66.40 cm/s MV E/A ratio:  0.86 Candee Furbish MD Electronically signed by Candee Furbish MD Signature Date/Time: 06/25/2019/12:59:19 PM    Final    VAS US CAROTID (at [facility] and [facility] only)  Result Date: 06/25/2019 Carotid Arterial Duplex Study Indications:       Weakness and unsteadiness and falls at home. Risk Factors:      Hypertension, Diabetes, prior CVA. Comparison Study:  Carotid study on 09/18/2017 - 1-39% stenosis. Performing Technologist: Oda Cogan RDMS, RVT  Examination Guidelines: A complete evaluation includes B-mode imaging, spectral Doppler, color Doppler, and  power Doppler as  needed of all accessible portions of each vessel. Bilateral testing is considered an integral part of a complete examination. Limited examinations for reoccurring indications may be performed as noted.  Right Carotid Findings: +----------+--------+--------+--------+------------------+------------------+           PSV cm/sEDV cm/sStenosisPlaque DescriptionComments           +----------+--------+--------+--------+------------------+------------------+ CCA Prox  58      14                                                   +----------+--------+--------+--------+------------------+------------------+ CCA Distal56      14                                intimal thickening +----------+--------+--------+--------+------------------+------------------+ ICA Prox  59      17      1-39%                     intimal thickening +----------+--------+--------+--------+------------------+------------------+ ICA Distal56      21                                                   +----------+--------+--------+--------+------------------+------------------+ ECA       112     19                                                   +----------+--------+--------+--------+------------------+------------------+ +----------+--------+-------+----------------+-------------------+           PSV cm/sEDV cmsDescribe        Arm Pressure (mmHG) +----------+--------+-------+----------------+-------------------+ [id number]             Multiphasic, WNL                    +----------+--------+-------+----------------+-------------------+ +---------+--------+--+--------+--+---------+ VertebralPSV cm/s56EDV cm/s16Antegrade +---------+--------+--+--------+--+---------+  Left Carotid Findings: +----------+--------+--------+--------+------------------+------------------+           PSV cm/sEDV cm/sStenosisPlaque DescriptionComments            +----------+--------+--------+--------+------------------+------------------+ CCA Prox  97      21                                                   +----------+--------+--------+--------+------------------+------------------+ CCA Distal72      17                                intimal thickening +----------+--------+--------+--------+------------------+------------------+ ICA Prox  86      20      1-39%   calcific                             +----------+--------+--------+--------+------------------+------------------+ ICA Distal53      18                                                   +----------+--------+--------+--------+------------------+------------------+  ECA       87      11                                                   +----------+--------+--------+--------+------------------+------------------+ +----------+--------+--------+----------------+-------------------+           PSV cm/sEDV cm/sDescribe        Arm Pressure (mmHG) +----------+--------+--------+----------------+-------------------+ Subclavian110             Multiphasic, WNL                    +----------+--------+--------+----------------+-------------------+ +---------+--------+--+--------+--+---------+ VertebralPSV cm/s41EDV cm/s15Antegrade +---------+--------+--+--------+--+---------+   Summary: Right Carotid: Velocities in the right ICA are consistent with a 1-39% stenosis. Left Carotid: Velocities in the left ICA are consistent with a 1-39% stenosis. Vertebrals:  Bilateral vertebral arteries demonstrate antegrade flow. Subclavians: Normal flow hemodynamics were seen in bilateral subclavian              arteries. *See table(s) above for measurements and observations.     Preliminary    VAS [place] LOWER EXTREMITY VENOUS (DVT)  Result Date: 06/25/2019  Lower Venous DVTStudy Indications: Edema. Other Indications: LLE weakness and left knee and hip pain. Performing Technologist: Oda Cogan RDMS, RVT  Examination Guidelines: A complete evaluation includes B-mode imaging, spectral Doppler, color Doppler, and power Doppler as needed of all accessible portions of each vessel. Bilateral testing is considered an integral part of a complete examination. Limited examinations for reoccurring indications may be performed as noted. The reflux portion of the exam is performed with the patient in reverse Trendelenburg.  +---------+---------------+---------+-----------+----------+--------------+ RIGHT    CompressibilityPhasicitySpontaneityPropertiesThrombus Aging +---------+---------------+---------+-----------+----------+--------------+ CFV      Full           Yes      Yes                                 +---------+---------------+---------+-----------+----------+--------------+ SFJ      Full                                                        +---------+---------------+---------+-----------+----------+--------------+ FV Prox  Full                                                        +---------+---------------+---------+-----------+----------+--------------+ FV Mid   Full                                                        +---------+---------------+---------+-----------+----------+--------------+ FV DistalFull                                                        +---------+---------------+---------+-----------+----------+--------------+  PFV      Full                                                        +---------+---------------+---------+-----------+----------+--------------+ POP      Full           Yes      Yes                                 +---------+---------------+---------+-----------+----------+--------------+ PTV      Full                                                        +---------+---------------+---------+-----------+----------+--------------+ PERO     Full                                                         +---------+---------------+---------+-----------+----------+--------------+   +----+---------------+---------+-----------+----------+--------------+ LEFTCompressibilityPhasicitySpontaneityPropertiesThrombus Aging +----+---------------+---------+-----------+----------+--------------+ CFV Full           Yes      Yes                                 +----+---------------+---------+-----------+----------+--------------+ SFJ Full                                                        +----+---------------+---------+-----------+----------+--------------+     Summary: RIGHT: - There is no evidence of deep vein thrombosis in the lower extremity.  - No cystic structure found in the popliteal fossa.  LEFT: - No evidence of common femoral vein obstruction.  *See table(s) above for measurements and observations. Electronically signed by Ruta Hinds MD on 06/25/2019 at 3:44:56 PM.    Final     PHYSICAL EXAM  Temp:  [98 F (36.7 C)-98.6 F (37 C)] 98.2 F (36.8 C) (05/03 1721) Pulse Rate:  [60-77] 70 (05/03 1721) Resp:  [16-21] 16 (05/03 1721) BP: (142-179)/(76-90) 142/78 (05/03 1721) SpO2:  [94 %-100 %] 98 % (05/03 1721)  General - Well nourished, well developed, in no apparent distress.  Ophthalmologic - fundi not visualized due to noncooperation.  Cardiovascular - Regular rhythm and rate.  Mental Status -  Level of arousal and orientation to time, place, and person were intact. Language including expression, naming, repetition, comprehension was assessed and found intact.  Cranial Nerves II - XII - II - Visual field intact OU. III, IV, VI - Extraocular movements intact. V - Facial sensation intact bilaterally. VII - Facial movement intact bilaterally. VIII - Hearing & vestibular intact bilaterally. X - Palate elevates symmetrically. XI - Chin turning & shoulder shrug intact bilaterally. XII - Tongue protrusion intact.  Motor Strength - The patient's strength was  symmetrical in all extremities and  pronator drift was absent.  Bulk was normal and fasciculations were absent.   Motor Tone - Muscle tone was assessed at the neck and appendages and was normal.  Reflexes - The patient's reflexes were symmetrical in all extremities and he had no pathological reflexes.  Sensory - Light touch, temperature/pinprick were assessed and were symmetrical.    Coordination - The patient had normal movements in the hands with no ataxia or dysmetria.  Tremor was absent.  Gait and Station - deferred.   ASSESSMENT/PLAN [name] is a 79 y.o. male with history of HTN, HLD, DB and prior stroke w/ R foot weakness with increasing number of falls past few weeks.   Stroke:  2 small R semiovale and MCA/ACA border zone small infarcts likely secondary to small vessel disease source.  CT head No acute abnormality. Small vessel disease. Atrophy. Old B basal ganglia and R thalamic lacunes and L frontal periventricular white matter infarct.     MRI  2 small R MCA territory infarcts (centrum semiovale and posterior corona radiata). Small vessel disease. Atrophy.   CTA head multifocal vascular stenosis including basal artery, right A2, left P1, and left M2  Carotid Doppler  B ICA 1-39% stenosis, VAs antegrade   LE doppler neg DVT  2D Echo EF 70 to 75%  Recommend 30-day cardiac event monitoring to rule out A. fib given 2 separate stroke locations  LDL 48  HgbA1c 7.2  SCDs for DVT prophylaxis.   clopidogrel 75 mg daily prior to admission (recently on DAPT), now on aspirin 325 mg daily and clopidogrel 75 mg daily.  Continue DAPT for 3 months and then Plavix alone given intracranial stenosis.  Therapy recommendations:  CIR  Disposition:  pending   Hx stroke/TIA  09/2018 -admitted for confusion and slurry speech, MRI showed left corona radiata infarct likely due to small vessel disease source in setting of intracranial atherosclerosis (L M2 high-grade stenosis).   MRA head and neck showed right P1, bilateral A2, left M2 stenosis.  EF 60 to 65%.  LDL 48 and A1c 6.9.  Put on DAPT x 3 mos then plavix alone.  Also Lipitor 80.  08/2017 admitted for right arm weakness.  MRI showed left BG infarct.  MRA showed left P1 high-grade stenosis with robust Pcom.  Carotid Doppler negative.  EF 60 to 65%.  LDL 115 and A1c 7.7.  Patient discharged with aspirin 325 and Plavix 75 and Lipitor 80.  Hypertension  Stable . Permissive hypertension (OK if < 220/120) but gradually normalize in 5-7 days . Long-term BP goal normotensive  Hyperlipidemia  Home meds:  lipitor 80 and fish oil, lipitor resumed in hospital  LDL 48, goal < 70  Continue statin at discharge  Diabetes type II Uncontrolled  HgbA1c 7.2, goal < 7.0  CBG monitoring  SSI  Close PCP follow-up  Other Stroke Risk Factors  Advanced age  Obesity, There is no height or weight on file to calculate BMI., recommend weight loss, diet and exercise as appropriate   Other Active Problems  Unilateral LE edema. BLE doppler neg DVT  Hospital day # 1  Neurology will sign off. Please call with questions. Pt will follow up with stroke clinic NP at [facility] on 08/08/2019. Thanks for the consult.  Rosalin Hawking, MD PhD Stroke Neurology 06/25/2019 5:48 PM  To contact Stroke Continuity provider, please refer to [email or web address] After hours, contact General Neurology

## 2019-06-25 NOTE — Progress Notes (Addendum)
PT Cancellation Note  Patient Details Name: Justin [name] MRN: [id number] DOB: September 09, 1940   Cancelled Treatment:    Reason Eval/Treat Not Completed: Patient at procedure or test/unavailable  Will follow up as schedule allows.   Lou Miner, DPT  Acute Rehabilitation Services  Pager: [phone] Office: [phone]    Rudean Hitt 06/25/2019, 10:58 AM

## 2019-06-25 NOTE — Progress Notes (Addendum)
Left lower extremity venous and carotid duplex completed. Refer to [facility] under chart review to view preliminary results.   06/25/2019  10:35 AM Noriko Macari, Bonnye Fava

## 2019-06-25 NOTE — Evaluation (Signed)
Occupational Therapy Evaluation  Patient Details Name: Justin [name] MRN: [id number] DOB: [birth year] Today's Date: 06/25/2019    History of present illness This 79 y.o. male admitted with increased number of falls over the past few weeks.  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  PMH includes:  HTN, DM, depression, CA, h/o brain bleed    OT comments  Pt admitted with above. He demonstrates the below listed deficits and will benefit from continued OT to maximize safety and independence with BADLs.  Pt presents to OT with impaired balance, impaired cognition, decreased activity tolerance.  He requires set up - total A for ADLs, and mod A to sit at EOB.  Unable to attempt transfers safely from ED stretcher due to heavy lean.  He reports he lives with wife, and is able to perform ADLs and ambulation with AD at mod I level, and wife assists with IADLs.  He will need post acute rehab, and would benefit from a CIR consult.  Will follow.     Follow Up Recommendations  CIR;Supervision/Assistance - 24 hour    Equipment Recommendations  None recommended by OT    Recommendations for Other Services Rehab consult    Precautions / Restrictions Precautions Precautions: Fall Precaution Comments: heavy posterior lean, and h/o falls        Mobility Bed Mobility Overal bed mobility: Needs Assistance Bed Mobility: Supine to Sit;Sit to Supine     Supine to sit: Mod assist Sit to supine: Mod assist   General bed mobility comments: assist to lift and lower trunk and assist for balance   Transfers                 General transfer comment: unable to safely attempt     Balance Overall balance assessment: Needs assistance Sitting-balance support: Feet unsupported;Bilateral upper extremity supported Sitting  balance-Leahy Scale: Poor Sitting balance - Comments: reliant on bil. UE support and heavy posterior lean        Standing balance comment: unable to attempt safely with +1 assist from ED stretcher                            ADL either performed or assessed with clinical judgement   ADL Overall ADL's : Needs assistance/impaired Eating/Feeding: Set up;Bed level   Grooming: Wash/dry hands;Wash/dry face;Oral care;Brushing hair;Minimal assistance;Bed level   Upper Body Bathing: Sitting;Moderate assistance   Lower Body Bathing: Maximal assistance;Bed level   Upper Body Dressing : Maximal assistance;Sitting   Lower Body Dressing: Total assistance;Bed level   Toilet Transfer: Total assistance Toilet Transfer Details (indicate cue type and reason): unable to safely perform  Toileting- Clothing Manipulation and Hygiene: Total assistance;Bed level Toileting - Clothing Manipulation Details (indicate cue type and reason): per RN, pt was in diaper and saturated with urine this am, and required total A for peri care and clean up      Functional mobility during ADLs: Maximal assistance;Moderate assistance       Vision Baseline Vision/History: Wears glasses Wears Glasses: Reading only Patient Visual Report: Blurring of vision Additional Comments: TBA.  Pt too distracted to participate in formal assessment in ED    Perception     Praxis      Cognition Arousal/Alertness: Awake/alert Behavior During Therapy: [facility] for tasks assessed/performed Overall  Cognitive Status: No family/caregiver present to determine baseline cognitive functioning                                 General Comments: Short Blessed Test administered.  He scored 15/28 which is indicative of significant cognitive deficit.  He demonstrated deficits with attention, organization, sequencing, and memory         Exercises     Shoulder Instructions       General Comments      Pertinent  Vitals/ Pain       Pain Assessment: No/denies pain  Home Living Family/patient expects to be discharged to:: Private residence   Available Help at Discharge: Family;Available PRN/intermittently Type of Home: House Home Access: Stairs to enter [employer] of Steps: 3 Entrance Stairs-Rails: Can reach both Home Layout: One level     Bathroom Shower/Tub: Occupational psychologist: Handicapped height Bathroom Accessibility: Yes   Home Equipment: Hand held shower head;Shower seat;Cane - quad;Walker - 2 wheels   Additional Comments: Pt reports he lives with wife who is able to assist some       Prior Functioning/Environment Level of Independence: Needs assistance  Gait / Transfers Assistance Needed: Pt reports he ambulates with quad cane in his home and has h/o falls.  Uses RW when outside  ADL's / Homemaking Assistance Needed: Pt reports he is able to perform ADLs mod I.  wife assists with IADLs and with medication management        Frequency  Min 2X/week        Progress Toward Goals  OT Goals(current goals can now be found in the care plan section)     Acute Rehab OT Goals Patient Stated Goal: to not fall  OT Goal Formulation: With patient Time For Goal Achievement: 07/09/19 Potential to Achieve Goals: Good  Plan      Co-evaluation                 AM-PAC OT "6 Clicks" Daily Activity     Outcome Measure   Help from another person eating meals?: A Little Help from another person taking care of personal grooming?: A Little Help from another person toileting, which includes using toliet, bedpan, or urinal?: Total Help from another person bathing (including washing, rinsing, drying)?: A Lot Help from another person to put on and taking off regular upper body clothing?: A Lot Help from another person to put on and taking off regular lower body clothing?: Total 6 Click Score: 12    End of Session    OT Visit Diagnosis: Unsteadiness on feet  (R26.81);Muscle weakness (generalized) (M62.81);Cognitive communication deficit (R41.841) Symptoms and signs involving cognitive functions: Cerebral infarction   Activity Tolerance Patient tolerated treatment well   Patient Left in bed   Nurse Communication Mobility status        Time: [id number] OT Time Calculation (min): 22 min  Charges: OT General Charges $OT Visit: 1 Visit OT Evaluation $OT Eval Moderate Complexity: 1 Mod  Nilsa Nutting., OTR/L Acute Rehabilitation Services Pager [phone] Office [phone]    Lucille Passy M 06/25/2019, 11:16 AM

## 2019-06-25 NOTE — Progress Notes (Signed)
Subjective:   [name] denies any acute complaints this morning and reports he was able to sleep some despite being in the hallway. Denies CP, SOB, dizziness headache, weakness.   Objective:  Vital signs in last 24 hours: Vitals:   06/24/19 2015 06/24/19 2030 06/24/19 2045 06/25/19 0314  BP: (!) 158/88 (!) 170/89 (!) 179/90 (!) 155/77  Pulse: 77 70 69 61  Resp: (!) 21 17 16 16   Temp:    98 F (36.7 C)  TempSrc:      SpO2: 96% 94% 97% 98%    Physical Exam Vitals and nursing note reviewed.  Constitutional:      General: He is not in acute distress.    Appearance: He is obese. He is not toxic-appearing.  HENT:     Head: Normocephalic and atraumatic.  Eyes:     Conjunctiva/sclera: Conjunctivae normal.  Cardiovascular:     Rate and Rhythm: Normal rate and regular rhythm.     Heart sounds: No murmur.  Pulmonary:     Effort: Pulmonary effort is normal. No respiratory distress.  Abdominal:     General: Bowel sounds are normal. There is no distension.     Palpations: Abdomen is soft.     Tenderness: There is no abdominal tenderness. There is no guarding.  Musculoskeletal:     Comments: Right calf larger in size than left with increased pitting edema. 2+ on the right. 1+ on the left  Skin:    General: Skin is warm and dry.  Neurological:     General: No focal deficit present.     Mental Status: He is alert.     Cranial Nerves: Cranial nerves are intact. No cranial nerve deficit, dysarthria or facial asymmetry.     Motor: No weakness (5/5 strength in bilateral upper and lower extremities).  Psychiatric:        Mood and Affect: Mood normal.        Behavior: Behavior normal.    Assessment/Plan:  Active Problems:   Stroke [facility])  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  # Acute Right MCA Infarct  Most likely secondary to small vessel disease, as MRI is notable for severe chronic ischemic microangiography. Patient states he has been compliant with Plavix. Differential also includes embolic infarct given multiple locations but no known A. Fib. TTE is pending.  LDL and diabetes are well controlled.   - Neurology following; appreciate their recommendations  - Out of window for permissive hypertension - Aspirin 81 mg daily - Plavix 75 mg daily - TTE  - Bilateral carotid dopplers  - PT/OT/SLP evaluation   # Unilateral lower extremity swelling Right calf increased in size with worsened pitting edema. Will evaluate for DVT.   - Right venous doppler - Continue home Lasix 20 mg daily   # Type 2 Diabetes Mellitus A1c of 7.2 indicating fairly well controlled diabetes. Home regimen includes Linagliptin, Metformin and Glipizide.   - SSI   # Hyperlipidemia  Total cholesterol of 103 and LDL of 48 indicating well controlled HDL.   - Continue Atorvastatin 80 mg daily   # Hypertension Improving BP this AM compared to admission but continues to be elevated. Will resume home regimen.  - Amlodipine  5 mg daily - HCTZ 25 mg daily - Losartan 100 mg daily     Dispo: Anticipated discharge in approximately 0-1 day(s).   Dr. Jose Persia Internal Medicine PGY-1  Pager: [phone] 06/25/2019, 7:59 AM

## 2019-06-26 ENCOUNTER — Encounter (HOSPITAL_COMMUNITY): Payer: Self-pay | Admitting: Internal Medicine

## 2019-06-26 DIAGNOSIS — I639 Cerebral infarction, unspecified: Secondary | ICD-10-CM | POA: Diagnosis not present

## 2019-06-26 LAB — BASIC METABOLIC PANEL
Anion gap: 10 (ref 5–15)
BUN: 15 mg/dL (ref 8–23)
CO2: 26 mmol/L (ref 22–32)
Calcium: 8.9 mg/dL (ref 8.9–10.3)
Chloride: 102 mmol/L (ref 98–111)
Creatinine, Ser: 1.28 mg/dL — ABNORMAL HIGH (ref 0.61–1.24)
GFR calc Af Amer: >60 mL/min (ref >60)
GFR calc non Af Amer: 54 mL/min — ABNORMAL LOW (ref >60)
Glucose, Bld: 128 mg/dL — ABNORMAL HIGH (ref 70–99)
Potassium: 3.7 mmol/L (ref 3.5–5.1)
Sodium: 138 mmol/L (ref 135–145)

## 2019-06-26 LAB — CBC
HCT: 45.1 % (ref 39.0–52.0)
Hemoglobin: 15.2 g/dL (ref 13.0–17.0)
MCH: 28.2 pg (ref 26.0–34.0)
MCHC: 33.7 g/dL (ref 30.0–36.0)
MCV: 83.7 fL (ref 80.0–100.0)
Platelets: 252 10*3/uL (ref 150–400)
RBC: 5.39 MIL/uL (ref 4.22–5.81)
RDW: 12.7 % (ref 11.5–15.5)
WBC: 9.1 10*3/uL (ref 4.0–10.5)
nRBC: 0 % (ref 0.0–0.2)

## 2019-06-26 LAB — GLUCOSE, CAPILLARY
Glucose-Capillary: 125 mg/dL — ABNORMAL HIGH (ref 70–99)
Glucose-Capillary: 171 mg/dL — ABNORMAL HIGH (ref 70–99)
Glucose-Capillary: 128 mg/dL — ABNORMAL HIGH (ref 70–99)
Glucose-Capillary: 131 mg/dL — ABNORMAL HIGH (ref 70–99)

## 2019-06-26 MED ORDER — LACTATED RINGERS IV BOLUS
500.0000 mL | Freq: Once | INTRAVENOUS | Status: AC
  Administered 2019-06-26: 500 mL via INTRAVENOUS

## 2019-06-26 NOTE — Evaluation (Signed)
Speech Language Pathology Evaluation Patient Details Name: Justin [name] MRN: [id number] DOB: Jun 09, 1940 Today's Date: 06/26/2019 Time: 0900-0930 SLP Time Calculation (min) (ACUTE ONLY): 30 min  Problem List:  Patient Active Problem List   Diagnosis Date Noted  . Ambulatory dysfunction   . Stroke ([place]) 06/24/2019  . Type 2 diabetes mellitus ([place]) 10/04/2018  . Depression 10/04/2018  . Acute CVA (cerebrovascular accident) ([place]) 09/18/2017  . Stroke (cerebrum) ([place]) 09/17/2017  . Diabetes mellitus without complication ([place]) [id number]  . Closed head injury 04/22/2013  . HLD (hyperlipidemia) 07/29/2009  . Essential hypertension 07/29/2009  . EFFUSION, PLEURAL 07/29/2009  . DIVERTICULITIS, COLON 07/29/2009  . 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 COLONIC POLYPS, HYPERPLASTIC, HX OF 07/29/2009   Past Medical History:  Past Medical History:  Diagnosis Date  . Brain bleed ([place])   . Cancer [facility])    prostate  . 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 Depression   . Diabetes mellitus without complication ([place])   . High cholesterol   . Hypertension   . Stroke [facility])    Past Surgical History:  Past Surgical History:  Procedure Laterality Date  . arm & hand surgery Left 1972   skill saw accident  . COLON SURGERY     partial  . EYE SURGERY     lens implant and cateract- Left  . 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   Assessment / Plan / Recommendation Clinical Impression   Justin [name] was seen for a cognitive-linguistic evaluation in setting of acute R MCA infarcts. Pt does have PMH including multiple CVAs in 2019 and 2020 with residual cognitive impairment. Wife reports feeling  that he is presently at baseline for cognition with ongoing impairments in word finding and memory. Pt was assessed with the SLUMS (see details below) and demonstrates a moderate-severe cognitive impairment c/b deficits in attention, memory, visual construction, orientation. Of note, orientation error for year was "2001" instead of "2021" which may have been a phonemic error versus orientation error. Pt does not handle any IADLs at baseline (no driving, cooking, med mgmt, finances, etc.) No further ST indicated at the acute level, however, pt may benefit from compensatory strategies/training at next level of care to increase independence/improve QOL.    [place] Mental Status Examination with results indicated below: Orientation: 1/3 Coding:3 trials to learn Word Fluency: 2/3 Recall: 3/5 Attention: 0/2 Visual processing: 4/6 Paragraph Memory: 3/8 Total: 13/30; consistent with moderate-severe neurocognitive disorder   Follow Up Recommendations    Inpatient Rehab   Frequency and Duration   No further therapy at acute level; may benefit from cognitive strategies at next level of care        SLP Evaluation Cognition  Overall Cognitive Status: History of cognitive impairments - at baseline Arousal/Alertness: Awake/alert Orientation Level: Oriented to person;Oriented to place;Disoriented to time Attention: Focused;Sustained Focused Attention: Impaired Focused Attention Impairment: Functional basic;Verbal basic;Verbal complex;Functional complex Sustained Attention: Impaired Sustained Attention Impairment: Functional basic;Functional complex;Verbal complex;Verbal basic Memory: Impaired Memory Impairment: Storage deficit;Retrieval deficit;Decreased recall of new information;Decreased short term memory Decreased Short Term Memory: Verbal basic;Verbal complex;Functional basic;Functional complex Awareness: Impaired Awareness Impairment: Intellectual  impairment Problem Solving: Impaired Problem Solving Impairment: Verbal basic;Verbal complex;Functional basic;Functional complex Executive Function: Reasoning Reasoning: Impaired Safety/Judgment: Impaired [employer] [employer] Scales of Cognitive Functioning: Purposeful/appropriate       Comprehension  Auditory Comprehension Overall Auditory Comprehension: Appears within functional limits for tasks assessed    Expression Verbal Expression Overall Verbal Expression: Appears within functional limits for tasks assessed(anomia baseline) Written Expression Dominant Hand: Right Written Expression: Within Functional Limits   Oral / Motor  Oral Motor/Sensory Function Overall Oral Motor/Sensory Function: Within functional limits   GO           Charnell Peplinski P. Oneta Sigman, M.S., CCC-SLP Speech-Language Pathologist Acute Rehabilitation Services Pager: [place] 06/26/2019, 10:45 AM

## 2019-06-26 NOTE — Progress Notes (Signed)
Physical Therapy Treatment Patient Details Name: Justin [name] MRN: [id number] DOB: [birth year] Today's Date: 06/26/2019    History of Present Illness This 79 y.o. male admitted with increased number of falls over the past few weeks.  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  PMH includes:  HTN, DM, depression, CA, h/o brain bleed     PT Comments    Pt very motivated and making good progress.  Demonstrated improved balance with cues, practice, and facilitation.  Pt able to ambulate 30' with RW but required min A, cues, and close chair follow due to L knee buckling.  L LE demonstrates 5/5 strength throughout when tested, but does demonstrate decreased functional strength when standing/walking. Pt does appear to have cognitive involvement and required repeated cues for safety.  Pt is excellent rehab candidate - continue to recommend CIR.    Follow Up Recommendations  CIR     Equipment Recommendations  Rolling walker with 5" wheels;3in1 (PT);Other (comment)(to be further assessed next venue)    Recommendations for Other Services Rehab consult     Precautions / Restrictions Precautions Precautions: Fall Restrictions Weight Bearing Restrictions: No    Mobility  Bed Mobility Overal bed mobility: Needs Assistance Bed Mobility: Supine to Sit     Supine to sit: Mod assist     General bed mobility comments: Cues for sequence and mod A for trunk assist  Transfers Overall transfer level: Needs assistance Equipment used: Rolling walker (2 wheeled) Transfers: Sit to/from Stand Sit to Stand: Min assist;Min guard         General transfer comment: Repeated cues for safe hand placement with sitting and standing.  Performed sit to stand x 5 during treatment and x 5 for exercise with L LE favored.   Initially with min A and cues for posture and progressing to min guard.  Ambulation/Gait Ambulation/Gait assistance: Min assist Gait Distance (Feet): 30 Feet(3', 15', then 30') Assistive device: Rolling walker (2 wheeled) Gait Pattern/deviations: Step-to pattern;Decreased stance time - left;Shuffle Gait velocity: decreased   General Gait Details: verbal cues as well as assist with weight shifting to increase R LE step length and stance on left; tendency to posterior lean that improved with cues and time; chair follow - had 1 episode of L knee buckling but recovered   Stairs             Wheelchair Mobility    Modified Rankin (Stroke Patients Only) Modified Rankin (Stroke Patients Only) Pre-Morbid Rankin Score: No symptoms Modified Rankin: Moderately severe disability     Balance Overall balance assessment: Needs assistance Sitting-balance support: Single extremity supported;Feet supported Sitting balance-Leahy Scale: Fair Sitting balance - Comments: Tending to lean R - could correct with verbal and visual cues; wife reports normally leans R; could maintain with SBA   Standing balance support: Bilateral upper extremity supported Standing balance-Leahy Scale: Fair Standing balance comment: initially with posterior lean and requiring mod A; given visual and tactile cues and pt able to progress to min guard without posterior lean                            Cognition Arousal/Alertness: Awake/alert Behavior During Therapy: WFL for tasks assessed/performed Overall Cognitive Status: Impaired/Different from baseline Area of Impairment: Problem solving  [place] Levels of Cognitive Functioning [place] Scales of Cognitive Functioning: Purposeful/appropriate             Problem Solving: Slow processing;Decreased initiation;Difficulty sequencing;Requires verbal cues;Requires tactile cues General Comments: Pt would laugh at times but  limited talking (wife reports this has been his normal amount of talking for a couple of months)      Exercises Other Exercises Other Exercises: sit to stand with R LE slightly forward to focus on L LE x 5 with increased time and cues    General Comments General comments (skin integrity, edema, etc.): [name] present      Pertinent Vitals/Pain Pain Assessment: No/denies pain    Home Living     Available Help at Discharge: Family;Available PRN/intermittently Type of Home: House              Prior Function            PT Goals (current goals can now be found in the care plan section) Acute Rehab PT Goals Patient Stated Goal: to not fall  PT Goal Formulation: With patient/family Time For Goal Achievement: 07/09/19 Potential to Achieve Goals: Good Progress towards PT goals: Progressing toward goals    Frequency    Min 4X/week      PT Plan Current plan remains appropriate    Co-evaluation              AM-PAC PT "6 Clicks" Mobility   Outcome Measure  Help needed turning from your back to your side while in a flat bed without using bedrails?: A Lot Help needed moving from lying on your back to sitting on the side of a flat bed without using bedrails?: A Lot Help needed moving to and from a bed to a chair (including a wheelchair)?: A Little Help needed standing up from a chair using your arms (e.g., wheelchair or bedside chair)?: A Little Help needed to walk in hospital room?: A Little Help needed climbing 3-5 steps with a railing? : Total 6 Click Score: 14    End of Session Equipment Utilized During Treatment: Gait belt Activity Tolerance: Patient tolerated treatment well Patient left: with chair alarm set;in chair;with call bell/phone within reach;with family/visitor present Nurse Communication: Mobility status PT Visit Diagnosis: Unsteadiness on feet (R26.81);History of falling (Z91.81);Repeated falls (R29.6);Muscle weakness (generalized) (M62.81)      Time: 1000-1031 PT Time Calculation (min) (ACUTE ONLY): 31 min  Charges:  $Gait Training: 8-22 mins $Therapeutic Activity: 8-22 mins                     Maggie Font, PT Acute Rehab Services Pager [phone] [facility] [phone] [facility] [phone]    Justin [name] 06/26/2019, 11:23 AM

## 2019-06-26 NOTE — Progress Notes (Signed)
   Subjective:   Justin [name] states he is feeling well today. He was able to work with PT and walk down the hallway to the bathroom, but then his knees gave out. He is having some left knee pain today. He denies dizziness, chest pain, SOB.   Justin [name] and his wife updated at bedside on plan for CIR consultation. They are both in agreement.   Objective:  Vital signs in last 24 hours: Vitals:   06/25/19 1721 06/25/19 1948 06/25/19 2334 06/26/19 0319  BP: (!) 142/78 132/74 138/74 135/66  Pulse: 70 74 71 61  Resp: 16 18 18 18   Temp: 98.2 F (36.8 C) 98 F (36.7 C) 98.1 F (36.7 C) 97.9 F (36.6 C)  TempSrc: Oral Oral Oral Oral  SpO2: 98% 97% 96% 97%    Physical Exam Vitals and nursing note reviewed.  Constitutional:      Appearance: He is normal weight.  HENT:     Head: Normocephalic and atraumatic.  Cardiovascular:     Rate and Rhythm: Normal rate and regular rhythm.     Heart sounds: No murmur. No gallop.   Pulmonary:     Effort: Pulmonary effort is normal. No respiratory distress.  Musculoskeletal:     Right lower leg: No edema.     Left lower leg: No edema.  Skin:    General: Skin is warm and dry.  Neurological:     Mental Status: He is alert. Mental status is at baseline. He is disoriented.     Comments: 5/5 strength upper and lower extremities  Psychiatric:        Mood and Affect: Mood normal.        Behavior: Behavior normal.    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  # Acute Right MCA Infarct  Most likely secondary to small vessel disease, as MRI is notable for severe chronic ischemic microangiography.  Differential also includes embolic infarct given multiple locations but no known A. Fib. Neurology has recommended a cardiac monitor. No thrombus on TTE.  LDL and diabetes are well controlled.  - Neurology following; appreciate their recommendations  - Out of window for permissive hypertension - Aspirin 81 mg daily - Plavix 75 mg daily - DAPT for 3 months then Plavix alone per Neurology  - CIR consult - Contact cardiology for event monitor placement   # Unilateral lower extremity swelling Negative for DVT.    # Type 2 Diabetes Mellitus A1c of 7.2 indicating fairly well controlled diabetes. Home regimen includes Linagliptin, Metformin and Glipizide.   - SSI   # Hyperlipidemia  Total cholesterol of 103 and LDL of 48 indicating well controlled HDL.   - Continue Atorvastatin 80 mg daily   # Hypertension Significantly improved.   - Amlodipine 5 mg daily - HCTZ 25 mg daily - Losartan 100 mg daily   Dispo: Medically stable. Pending CIR consult and possible placement.    Dr. Jose Persia Internal Medicine PGY-1  Pager: [phone] 06/26/2019, 7:32 AM

## 2019-06-26 NOTE — PMR Pre-admission (Signed)
PMR Admission Coordinator Pre-Admission Assessment  Patient: Justin [name] [name] is an 79 y.o., male MRN: [id number] DOB: 05/04/1940 Height:   Weight:                Insurance Information  PRIMARY: [employer]    Policy#: [id number]      Subscriber: pt CM Name: Justin [name] [name]      Phone#: [phone] ext [phone]     Fax#: [phone] Pre-Cert#: [id number] approved for 7 days f/u with Jeanette Caprice at ext [phone] same fax in 7 days      Employer:  Benefits:  Phone #: [phone]     Name: 5/4 Eff. Date: 02/23/2019     Deduct: none      Out of Pocket Max: $3900      Life Max: none  CIR: $295 co pay per day days 1 until 6      SNF: no copay days 1 until 20; $184 co pay per day days 21 until 100 Outpatient: $10 to $40 per visit     Co-Pay: visits limited per medical neccesity Home Health: 1005      Co-Pay: visits limited per medical neccesity DME: 80%     Co-Pay: 20%  Providers: in network  [facility]  Financial Counselor:       Phone#:   The Engineer, [employer]" for patients in Inpatient Rehabilitation Facilities with attached "Privacy Act [place] Records" was provided and verbally reviewed with: Patient and Family  Emergency Contact Information Contact Information    Name Relation Home Work Mobile   Justin [name], Justin [name] [phone]  [phone]     Current Medical History  Patient Admitting Diagnosis:  CVA  History of Present Illness: 78 y.o. 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 He was admitted on 06/24/19 with multiple falls in the past week requiring walker, speech difficulty and weakness.  MRI brain done revealing 2 small acute infracts in R-MCA territory and advanced chronic ischemic changes with widespread atrophy advanced for age.  CTA head neck was negative for high grade occlusion or stenosis but multifocal areas of  atherosclerotic irregularity. Carotid dopplers was negative for ICA stenosis. BLE dopplers negative for DVT. 2 D echo showed EF 70-75%, hyperdynamic function with grade I diastolic dysfunction and no shunt. Dr. Erlinda Hong felt that stroke was due to small vessel disease but recommended 30 day event monitor to rule out A fib. Therapy evaluations completed yesterday revealing balance deficits with right lateral lean, impaired cognition with decreased activity tolerance.   Complete NIHSS TOTAL: 0 Glasgow Coma Scale Score: 15  Past Medical History  Past Medical History:  Diagnosis Date  . Brain bleed ([place])   . Cancer [facility])    prostate  . 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 Depression   . Diabetes mellitus without complication ([place])   . High cholesterol   . Hypertension   . Stroke [facility])     Family History  family history includes CAD in his mother; Diabetes in his brother, brother, mother, sister, and sister; Emphysema in his father.  Prior Rehab/Hospitalizations:  Has the patient had prior rehab or hospitalizations prior to admission? Yes  Has the patient had major surgery during 100 days prior to admission? No  Current Medications   Current Facility-Administered Medications:  .  acetaminophen (TYLENOL) tablet 650 mg, 650 mg, Oral, Q4H PRN **OR** acetaminophen (TYLENOL) 160 MG/5ML  solution 650 mg, 650 mg, Per Tube, Q4H PRN **OR** acetaminophen (TYLENOL) suppository 650 mg, 650 mg, Rectal, Q4H PRN, Justin [name] [name], Elhamalsadat, Justin [name] .  amLODipine (NORVASC) tablet 5 mg, 5 mg, Oral, Daily, Justin Persia, Justin [name], 5 mg at 06/28/19 1019 .  aspirin EC tablet 81 mg, 81 mg, Oral, Daily, Justin [name] [name], Justin, Justin [name], 81 mg at 06/28/19 1019 .  atorvastatin (LIPITOR) tablet 80 mg, 80 mg, Oral, Daily, Justin [name] [name], Elhamalsadat, Justin [name], 80 mg at 06/28/19 1019 .  clopidogrel (PLAVIX) tablet 75 mg, 75 mg, Oral, Daily, Justin [name] [name], Elhamalsadat, Justin [name], 75 mg at 06/28/19 1019 .  furosemide (LASIX) tablet 20 mg, 20 mg, Oral, Daily,  Justin [name] [name], Elhamalsadat, Justin [name], 20 mg at 06/28/19 1019 .  hydrochlorothiazide (HYDRODIURIL) tablet 25 mg, 25 mg, Oral, Daily, Justin Persia, Justin [name], 25 mg at 06/28/19 1019 .  insulin aspart (novoLOG) injection 0-9 Units, 0-9 Units, Subcutaneous, TID WC, Justin [name] [name], Elhamalsadat, Justin [name], 2 Units at 06/27/19 1742 .  losartan (COZAAR) tablet 100 mg, 100 mg, Oral, Daily, Justin Persia, Justin [name], 100 mg at 06/28/19 1019 .  senna-docusate (Senokot-S) tablet 1 tablet, 1 tablet, Oral, QHS PRN, Justin [name] [name], Elhamalsadat, Justin [name] .  sertraline (ZOLOFT) tablet 25 mg, 25 mg, Oral, Daily, Justin [name] [name], Justin [name] [name], Justin [name], 25 mg at 06/28/19 1019  Patients Current Diet:  Diet Order            Diet - low sodium heart healthy        Diet heart healthy/carb modified Room service appropriate? Yes with Assist; Fluid consistency: Thin  Diet effective now              Precautions / Restrictions Precautions Precautions: Fall Precaution Comments: posterior  and R lateral lean and h/o falls Restrictions Weight Bearing Restrictions: No   Has the patient had 2 or more falls or a fall with injury in the past year?Yes over past 3 to 4 months  Prior Activity Level Limited Community (1-2x/wk): Independent; not driving; more sedentary over past 3 to 4 monthsJust began using RW more since falling . Has had difficulty getting out of chair for a few days. Wife assist with adls and medication management. Patient with gradual decline in function over past 3 to 4 months per wife. More sedentary and non talkative. Wears depends she feels so that he will not have to ambulate distance to bathroom. She has discussed with his PCP.  Prior Functional Level Prior Function Level of Independence: Needs assistance Gait / Transfers Assistance Needed: Pts wife reports he just started using RW more since falling. Has had difficulty getting up out of a chair the past few days.  ADL's / Homemaking Assistance Needed: Pt reports he is able to perform ADLs mod I.  wife  assists with IADLs and with medication management   Self Care: Did the patient need help bathing, dressing, using the toilet or eating?  Needed some help  Indoor Mobility: Did the patient need assistance with walking from room to room (with or without device)? Independent  Stairs: Did the patient need assistance with internal or external stairs (with or without device)? Independent  Functional Cognition: Did the patient need help planning regular tasks such as shopping or remembering to take medications? Needed some help  Home Assistive Devices / Equipment Home Equipment: Hand held shower head, Shower seat, Cane - quad, Environmental consultant - 2 wheels  Prior Device Use: Indicate devices/aids used by the patient prior to current illness, exacerbation or injury? Walker  Current Functional Level Cognition  Arousal/Alertness: Awake/alert Overall Cognitive Status: Impaired/Different from  baseline Orientation Level: Oriented X4 Following Commands: Follows one step commands with increased time General Comments: slow to respond to therapist but appropriate responses and oriented Attention: Focused, Sustained Focused Attention: Impaired Focused Attention Impairment: Functional basic, Verbal basic, Verbal complex, Functional complex Sustained Attention: Impaired Sustained Attention Impairment: Functional basic, Functional complex, Verbal complex, Verbal basic Memory: Impaired Memory Impairment: Storage deficit, Retrieval deficit, Decreased recall of new information, Decreased short term memory Decreased Short Term Memory: Verbal basic, Verbal complex, Functional basic, Functional complex Awareness: Impaired Awareness Impairment: Intellectual impairment Problem Solving: Impaired Problem Solving Impairment: Verbal basic, Verbal complex, Functional basic, Functional complex Executive Function: Reasoning Reasoning: Impaired Safety/Judgment: Impaired [employer] Scales of Cognitive Functioning:  Purposeful/appropriate    Extremity Assessment (includes Sensation/Coordination)  Upper Extremity Assessment: Defer to OT evaluation  Lower Extremity Assessment: Generalized weakness    ADLs  Overall ADL's : Needs assistance/impaired Eating/Feeding: Set up, Bed level Grooming: Wash/dry hands, Wash/dry face, Oral care, Brushing hair, Minimal assistance, Bed level Upper Body Bathing: Sitting, Moderate assistance Lower Body Bathing: Maximal assistance, Bed level Upper Body Dressing : Maximal assistance, Sitting Lower Body Dressing: Total assistance, Bed level Toilet Transfer: Total assistance Toilet Transfer Details (indicate cue type and reason): unable to safely perform  Toileting- Clothing Manipulation and Hygiene: Total assistance, Bed level Toileting - Clothing Manipulation Details (indicate cue type and reason): per RN, pt was in diaper and saturated with urine this am, and required total A for peri care and clean up  Functional mobility during ADLs: Maximal assistance, Moderate assistance    Mobility  Overal bed mobility: Needs Assistance Bed Mobility: Supine to Sit Supine to sit: Min assist Sit to supine: Mod assist General bed mobility comments: assist to scoot R hip to EOB; use of rail     Transfers  Overall transfer level: Needs assistance Equipment used: Rolling walker (2 wheeled) Transfers: Sit to/from Stand Sit to Stand: Min assist General transfer comment: cues for safe hand placement; assist to power up and to steady     Ambulation / Gait / Stairs / Wheelchair Mobility  Ambulation/Gait Ambulation/Gait assistance: Min assist, +2 safety/equipment(chair follow ) Gait Distance (Feet): 70 Feet Assistive device: Rolling walker (2 wheeled) Gait Pattern/deviations: Step-to pattern, Step-through pattern, Decreased step length - right, Shuffle, Decreased dorsiflexion - right General Gait Details: assist for balance and weight shifting as pt has R lateral bias; cues for  upright posture and increased bilat step lengths; increased difficulty with motor planning and advancing R LE with increased gait distance  Gait velocity: decreased    Posture / Balance Dynamic Sitting Balance Sitting balance - Comments: Tending to lean R - could correct with verbal and visual cues; wife reports normally leans R; could maintain with SBA Balance Overall balance assessment: Needs assistance Sitting-balance support: Feet supported Sitting balance-Leahy Scale: Fair Sitting balance - Comments: Tending to lean R - could correct with verbal and visual cues; wife reports normally leans R; could maintain with SBA Postural control: Posterior lean, Right lateral lean Standing balance support: Bilateral upper extremity supported Standing balance-Leahy Scale: Poor Standing balance comment: initially with posterior lean and requiring mod A; given visual and tactile cues and pt able to progress to min guard without posterior lean    Special needs/care consideration  Diabetic management with Hgb A1c 7.2 Designated visitors are [place] Arrangements: Spouse/significant other  Lives With: Spouse Available Help at Discharge: Family, Available 24 hours/day Type of Home: House Home Layout: One  level Home Access: Stairs to enter Entrance Stairs-Rails: Right Entrance Stairs-Number of Steps: 3 Bathroom Shower/Tub: Multimedia programmer: Handicapped height Bathroom Accessibility: Yes Home Care Services: No Additional Comments: wife can provide 24/7 supervision  Discharge Living Setting Plans for Discharge Living Setting: Patient's home, Lives with (comment) Type of Home at Discharge: House Discharge Home Layout: One level Discharge Home Access: Stairs to enter Entrance Stairs-Rails: Right Entrance Stairs-Number of Steps: 3 Discharge Bathroom Shower/Tub: Walk-in shower Discharge Bathroom Toilet: Handicapped height Discharge Bathroom  Accessibility: Yes How Accessible: Accessible via walker Does the patient have any problems obtaining your medications?: No  Social/Family/Support Systems Patient Roles: Spouse, Parent Contact Information: wife, [name] Anticipated Caregiver: wife Anticipated Ambulance person Information: see above Ability/Limitations of Caregiver: supervision level Caregiver Availability: 24/7 Discharge Plan Discussed with Primary Caregiver: Yes Is Caregiver In Agreement with Plan?: Yes Does Caregiver/Family have Issues with Lodging/Transportation while Pt is in Rehab?: No  Goals Patient/Family Goal for Rehab: Mod I to supervision with PT and OT and SLP Expected length of stay: ELOS 7 to 10 days Pt/Family Agrees to Admission and willing to participate: Yes Program Orientation Provided & Reviewed with Pt/Caregiver Including Roles  & Responsibilities: Yes  Decrease burden of Care through IP rehab admission: n/a  Possible need for SNF placement upon discharge: not anticipated  Patient Condition: This patient's condition remains as documented in the consult dated 06/26/2019, in which the Rehabilitation Physician determined and documented that the patient's condition is appropriate for intensive rehabilitative care in an inpatient rehabilitation facility. Will admit to inpatient rehab today.  Preadmission Screen Completed By:  Cleatrice Burke, RN, 06/28/2019 11:27 AM ______________________________________________________________________   Discussed status with Dr. Dagoberto Ligas on 06/28/2019 at  1128 and received approval for admission today.  Admission Coordinator:  Cleatrice Burke, time [id number] Date 06/28/2019

## 2019-06-27 LAB — BASIC METABOLIC PANEL
Anion gap: 10 (ref 5–15)
BUN: 17 mg/dL (ref 8–23)
CO2: 27 mmol/L (ref 22–32)
Calcium: 9.1 mg/dL (ref 8.9–10.3)
Chloride: 100 mmol/L (ref 98–111)
Creatinine, Ser: 1.05 mg/dL (ref 0.61–1.24)
GFR calc Af Amer: >60 mL/min (ref >60)
GFR calc non Af Amer: >60 mL/min (ref >60)
Glucose, Bld: 127 mg/dL — ABNORMAL HIGH (ref 70–99)
Potassium: 3.8 mmol/L (ref 3.5–5.1)
Sodium: 137 mmol/L (ref 135–145)

## 2019-06-27 LAB — GLUCOSE, CAPILLARY
Glucose-Capillary: 112 mg/dL — ABNORMAL HIGH (ref 70–99)
Glucose-Capillary: 153 mg/dL — ABNORMAL HIGH (ref 70–99)
Glucose-Capillary: 178 mg/dL — ABNORMAL HIGH (ref 70–99)
Glucose-Capillary: 159 mg/dL — ABNORMAL HIGH (ref 70–99)

## 2019-06-27 MED ORDER — SERTRALINE HCL 50 MG PO TABS
25.0000 mg | ORAL_TABLET | Freq: Every day | ORAL | Status: DC
  Administered 2019-06-27 – 2019-06-28 (×2): 25 mg via ORAL
  Filled 2019-06-27 (×2): qty 1

## 2019-06-27 NOTE — Progress Notes (Addendum)
Inpatient Rehabilitation Admissions Coordinator  Inpatient rehab consult received. I met with patient at bedside for rehab assessment and to discuss rehab goals and expectations. I have left a voicemail for his wife to call me to further discuss. I have begun insurance authorization with [employer] and await their approval and bed availability.  Danne Baxter, RN, MSN Rehab Admissions Coordinator [phone] 06/27/2019 2:37 PM  I met with patient and wife at bedside to discuss goals and expectations of inpt rehab. They are in agreement to admit. I have insurance approval and await bed availability.  Danne Baxter, RN, MSN Rehab Admissions Coordinator [phone] 06/27/2019 5:03 PM

## 2019-06-27 NOTE — Progress Notes (Signed)
Patient uses PPV at home but did not bring his machine.  He was informed that a machine could be provided for him to use tonight.  Mr. Oo refused the offer to use a hospital owned machine.

## 2019-06-27 NOTE — Progress Notes (Signed)
   Subjective:   Patient reports feeling okay, denies any issues today. He was resting comfortably in bed. Discussed the plan for today. All questions were answered.   Objective:  Vital signs in last 24 hours: Vitals:   06/26/19 1611 06/26/19 1930 06/26/19 2329 06/27/19 0312  BP: 120/67 126/70 (!) 149/66 125/65  Pulse: 64 68 63 (!) 58  Resp: 16 18 18 16   Temp: 98.3 F (36.8 C) 98.2 F (36.8 C) (!) 97.4 F (36.3 C) 98.3 F (36.8 C)  TempSrc: Oral Oral Oral Oral  SpO2: 100% 99% 100% 97%   Physical Exam Vitals and nursing note reviewed.  Constitutional:      General: He is not in acute distress.    Appearance: He is obese.  HENT:     Head: Normocephalic and atraumatic.  Cardiovascular:     Rate and Rhythm: Normal rate and regular rhythm.     Heart sounds: No murmur.  Pulmonary:     Effort: Pulmonary effort is normal. No respiratory distress.     Breath sounds: No wheezing or rales.  Abdominal:     General: Bowel sounds are normal. There is no distension.     Palpations: Abdomen is soft.     Tenderness: There is no abdominal tenderness. There is no guarding.  Musculoskeletal:        General: No swelling or tenderness.     Right lower leg: No edema.     Left lower leg: No edema.  Skin:    General: Skin is warm and dry.  Neurological:     General: No focal deficit present.     Mental Status: He is alert. Mental status is at baseline.  Psychiatric:        Mood and Affect: Mood normal.        Behavior: Behavior normal.    Assessment/Plan:  Active Problems:   Stroke [facility]   Ambulatory dysfunction  [name] is a 79 year old M with significant PMH of CVA (2019, 2020) with residual deficits of cognitive impairment, hypertension, type II diabetes, hyperlipidemia, OSA on CPAP, and prostate cancer s/p prostatectomy in 2012, who initially presented for recurrent falls and was found to have 2 acute R MCA infarcts.  # Acute Right MCA Infarct  Most likely secondary to  small vessel disease, as MRI is notable for severe chronic ischemic microangiography. Differential also includes embolic infarct given multiple locations but no known A. Fib. Neurology has recommended a cardiac monitor. No thrombus on TTE.  LDL and diabetes are well controlled.  - Neurology following; appreciate their recommendations  - Out of window for permissive hypertension - Aspirin 81 mg daily - Plavix 75 mg daily - DAPT for 3 months then Plavix alone per Neurology  - Contact cardiology for event monitor placement   - Awaiting CIR placement  # Type 2 Diabetes Mellitus A1c of 7.2 indicating fairly well controlled diabetes. Home regimen includes Linagliptin, Metformin and Glipizide.   - SSI   # Hyperlipidemia  - Continue Atorvastatin 80 mg daily   # Hypertension - Amlodipine 5 mg daily - HCTZ 25 mg daily - Losartan 100 mg daily   # AKI  Resolved  Dispo: Medically stable. Pending CIR placement.    Dr. Jose Persia Internal Medicine PGY-1  Pager: [phone] 06/27/2019, 7:42 AM

## 2019-06-27 NOTE — Progress Notes (Signed)
Discussed obtaining event monitor with Trish, Cardsmaster. She has requested CIR contact Cardiology a few days before anticipated discharge for placement of monitor.

## 2019-06-27 NOTE — Progress Notes (Signed)
Physical Therapy Treatment Patient Details Name: Justin [name] MRN: [id number] DOB: [birth year] Today's Date: 06/27/2019    History of Present Illness This 79 y.o. male admitted with increased number of falls over the past few weeks.  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  PMH includes:  HTN, DM, depression, CA, h/o brain bleed     PT Comments    Patient is making progress toward PT goals and tolerated increased mobility well. Continue to recommend CIR for further skilled PT services to maximize independence and safety with mobility.    Follow Up Recommendations  CIR     Equipment Recommendations  Rolling walker with 5" wheels;3in1 (PT)    Recommendations for Other Services Rehab consult     Precautions / Restrictions Precautions Precautions: Fall Precaution Comments: posterior  and R lateral lean and h/o falls Restrictions Weight Bearing Restrictions: No    Mobility  Bed Mobility Overal bed mobility: Needs Assistance Bed Mobility: Supine to Sit     Supine to sit: Min assist     General bed mobility comments: assist to scoot R hip to EOB; use of rail   Transfers Overall transfer level: Needs assistance Equipment used: Rolling walker (2 wheeled) Transfers: Sit to/from Stand Sit to Stand: Min assist         General transfer comment: cues for safe hand placement; assist to power up and to steady   Ambulation/Gait Ambulation/Gait assistance: Min assist;+2 safety/equipment(chair follow ) Gait Distance (Feet): 70 Feet Assistive device: Rolling walker (2 wheeled) Gait Pattern/deviations: Step-to pattern;Step-through pattern;Decreased step length - right;Shuffle;Decreased dorsiflexion - right Gait velocity: decreased   General Gait Details: assist for balance and weight shifting  as pt has R lateral bias; cues for upright posture and increased bilat step lengths; increased difficulty with motor planning and advancing R LE with increased gait distance    Stairs             Wheelchair Mobility    Modified Rankin (Stroke Patients Only) Modified Rankin (Stroke Patients Only) Pre-Morbid Rankin Score: No symptoms Modified Rankin: Moderately severe disability     Balance Overall balance assessment: Needs assistance Sitting-balance support: Feet supported Sitting balance-Leahy Scale: Fair     Standing balance support: Bilateral upper extremity supported Standing balance-Leahy Scale: Poor                              Cognition Arousal/Alertness: Awake/alert Behavior During Therapy: WFL for tasks assessed/performed Overall Cognitive Status: Impaired/Different from baseline Area of Impairment: Problem solving                       Following Commands: Follows one step commands with increased time     Problem Solving: Slow processing;Difficulty sequencing;Requires verbal cues;Requires tactile cues General Comments: slow to respond to therapist but appropriate responses and oriented      Exercises      General Comments        Pertinent Vitals/Pain Pain Assessment: No/denies pain    Home Living                      Prior Function            PT Goals (current goals can now be found in the care plan  section) Progress towards PT goals: Progressing toward goals    Frequency    Min 4X/week      PT Plan Current plan remains appropriate    Co-evaluation              AM-PAC PT "6 Clicks" Mobility   Outcome Measure  Help needed turning from your back to your side while in a flat bed without using bedrails?: A Little Help needed moving from lying on your back to sitting on the side of a flat bed without using bedrails?: A Lot Help needed moving to and from a bed to a chair (including a wheelchair)?: A  Little Help needed standing up from a chair using your arms (e.g., wheelchair or bedside chair)?: A Little Help needed to walk in hospital room?: A Little Help needed climbing 3-5 steps with a railing? : Total 6 Click Score: 15    End of Session Equipment Utilized During Treatment: Gait belt Activity Tolerance: Patient tolerated treatment well Patient left: with chair alarm set;in chair;with call bell/phone within reach Nurse Communication: Mobility status PT Visit Diagnosis: Unsteadiness on feet (R26.81);History of falling (Z91.81);Repeated falls (R29.6);Muscle weakness (generalized) (M62.81)     Time: [id number] PT Time Calculation (min) (ACUTE ONLY): 20 min  Charges:  $Gait Training: 8-22 mins                     Earney Navy, PTA Acute Rehabilitation Services Pager: [phone] Office: [phone]     Darliss Cheney 06/27/2019, 4:44 PM

## 2019-06-28 ENCOUNTER — Encounter (HOSPITAL_COMMUNITY): Payer: Self-pay | Admitting: Physical Medicine & Rehabilitation

## 2019-06-28 ENCOUNTER — Other Ambulatory Visit: Payer: Self-pay

## 2019-06-28 ENCOUNTER — Inpatient Hospital Stay (HOSPITAL_COMMUNITY)
Admission: RE | Admit: 2019-06-28 | Discharge: 2019-07-13 | DRG: 057 | Disposition: A | Discharge status: Home Health | Payer: Medicare HMO | Source: Intra-hospital | Attending: Physical Medicine & Rehabilitation | Admitting: Physical Medicine & Rehabilitation

## 2019-06-28 DIAGNOSIS — I10 Essential (primary) hypertension: Secondary | ICD-10-CM | POA: Diagnosis present

## 2019-06-28 DIAGNOSIS — Z7984 Long term (current) use of oral hypoglycemic drugs: Secondary | ICD-10-CM | POA: Diagnosis not present

## 2019-06-28 DIAGNOSIS — Z6830 Body mass index (BMI) 30.0-30.9, adult: Secondary | ICD-10-CM | POA: Diagnosis not present

## 2019-06-28 DIAGNOSIS — E7800 Pure hypercholesterolemia, unspecified: Secondary | ICD-10-CM | POA: Diagnosis present

## 2019-06-28 DIAGNOSIS — I63511 Cerebral infarction due to unspecified occlusion or stenosis of right middle cerebral artery: Secondary | ICD-10-CM | POA: Diagnosis not present

## 2019-06-28 DIAGNOSIS — R269 Unspecified abnormalities of gait and mobility: Secondary | ICD-10-CM

## 2019-06-28 DIAGNOSIS — R531 Weakness: Secondary | ICD-10-CM | POA: Diagnosis present

## 2019-06-28 DIAGNOSIS — R296 Repeated falls: Secondary | ICD-10-CM | POA: Diagnosis present

## 2019-06-28 DIAGNOSIS — I69351 Hemiplegia and hemiparesis following cerebral infarction affecting right dominant side: Principal | ICD-10-CM

## 2019-06-28 DIAGNOSIS — E1140 Type 2 diabetes mellitus with diabetic neuropathy, unspecified: Secondary | ICD-10-CM | POA: Diagnosis present

## 2019-06-28 DIAGNOSIS — R32 Unspecified urinary incontinence: Secondary | ICD-10-CM | POA: Diagnosis present

## 2019-06-28 DIAGNOSIS — Z823 Family history of stroke: Secondary | ICD-10-CM

## 2019-06-28 DIAGNOSIS — Z833 Family history of diabetes mellitus: Secondary | ICD-10-CM | POA: Diagnosis not present

## 2019-06-28 DIAGNOSIS — E669 Obesity, unspecified: Secondary | ICD-10-CM | POA: Diagnosis present

## 2019-06-28 DIAGNOSIS — E785 Hyperlipidemia, unspecified: Secondary | ICD-10-CM | POA: Diagnosis present

## 2019-06-28 DIAGNOSIS — R159 Full incontinence of feces: Secondary | ICD-10-CM | POA: Diagnosis present

## 2019-06-28 DIAGNOSIS — F331 Major depressive disorder, recurrent, moderate: Secondary | ICD-10-CM

## 2019-06-28 DIAGNOSIS — Z7982 Long term (current) use of aspirin: Secondary | ICD-10-CM | POA: Diagnosis not present

## 2019-06-28 DIAGNOSIS — Z79899 Other long term (current) drug therapy: Secondary | ICD-10-CM | POA: Diagnosis not present

## 2019-06-28 DIAGNOSIS — Z7902 Long term (current) use of antithrombotics/antiplatelets: Secondary | ICD-10-CM | POA: Diagnosis not present

## 2019-06-28 DIAGNOSIS — E1169 Type 2 diabetes mellitus with other specified complication: Secondary | ICD-10-CM

## 2019-06-28 LAB — GLUCOSE, CAPILLARY
Glucose-Capillary: 117 mg/dL — ABNORMAL HIGH (ref 70–99)
Glucose-Capillary: 174 mg/dL — ABNORMAL HIGH (ref 70–99)
Glucose-Capillary: 214 mg/dL — ABNORMAL HIGH (ref 70–99)
Glucose-Capillary: 194 mg/dL — ABNORMAL HIGH (ref 70–99)
Glucose-Capillary: 209 mg/dL — ABNORMAL HIGH (ref 70–99)

## 2019-06-28 LAB — BASIC METABOLIC PANEL
Anion gap: 7 (ref 5–15)
BUN: 23 mg/dL (ref 8–23)
CO2: 27 mmol/L (ref 22–32)
Calcium: 9 mg/dL (ref 8.9–10.3)
Chloride: 103 mmol/L (ref 98–111)
Creatinine, Ser: 1.07 mg/dL (ref 0.61–1.24)
GFR calc Af Amer: >60 mL/min (ref >60)
GFR calc non Af Amer: >60 mL/min (ref >60)
Glucose, Bld: 136 mg/dL — ABNORMAL HIGH (ref 70–99)
Potassium: 4.1 mmol/L (ref 3.5–5.1)
Sodium: 137 mmol/L (ref 135–145)

## 2019-06-28 MED ORDER — PROCHLORPERAZINE EDISYLATE 10 MG/2ML IJ SOLN: 5.0000 mg | Freq: Four times a day (QID) | INTRAMUSCULAR | Status: DC | PRN

## 2019-06-28 MED ORDER — CLOPIDOGREL BISULFATE 75 MG PO TABS
75.0000 mg | ORAL_TABLET | Freq: Every day | ORAL | Status: DC
  Administered 2019-06-29 – 2019-07-13 (×15): 75 mg via ORAL
  Filled 2019-06-28 (×15): qty 1

## 2019-06-28 MED ORDER — INSULIN ASPART 100 UNIT/ML ~~LOC~~ SOLN
0.0000 [IU] | Freq: Three times a day (TID) | SUBCUTANEOUS | Status: DC
  Administered 2019-06-29: 18:00:00 1 [IU] via SUBCUTANEOUS
  Administered 2019-06-29 – 2019-06-30 (×2): 2 [IU] via SUBCUTANEOUS
  Administered 2019-06-30 – 2019-07-11 (×5): 1 [IU] via SUBCUTANEOUS

## 2019-06-28 MED ORDER — ASPIRIN 81 MG PO TBEC: 81.0000 mg | DELAYED_RELEASE_TABLET | Freq: Every day | ORAL | 0 refills | Status: DC

## 2019-06-28 MED ORDER — PROCHLORPERAZINE 25 MG RE SUPP: 12.5000 mg | Freq: Four times a day (QID) | RECTAL | Status: DC | PRN

## 2019-06-28 MED ORDER — SENNOSIDES-DOCUSATE SODIUM 8.6-50 MG PO TABS: 1.0000 | ORAL_TABLET | Freq: Every evening | ORAL | Status: DC | PRN

## 2019-06-28 MED ORDER — LOSARTAN POTASSIUM 50 MG PO TABS
100.0000 mg | ORAL_TABLET | Freq: Every day | ORAL | Status: DC
  Administered 2019-06-29 – 2019-07-13 (×15): 100 mg via ORAL
  Filled 2019-06-28 (×16): qty 2

## 2019-06-28 MED ORDER — ALUM & MAG HYDROXIDE-SIMETH 200-200-20 MG/5ML PO SUSP: 30.0000 mL | ORAL | Status: DC | PRN

## 2019-06-28 MED ORDER — GUAIFENESIN-DM 100-10 MG/5ML PO SYRP: 5.0000 mL | ORAL_SOLUTION | Freq: Four times a day (QID) | ORAL | Status: DC | PRN

## 2019-06-28 MED ORDER — PROCHLORPERAZINE MALEATE 5 MG PO TABS: 5.0000 mg | ORAL_TABLET | Freq: Four times a day (QID) | ORAL | Status: DC | PRN

## 2019-06-28 MED ORDER — DIPHENHYDRAMINE HCL 12.5 MG/5ML PO ELIX: 12.5000 mg | ORAL_SOLUTION | Freq: Four times a day (QID) | ORAL | Status: DC | PRN

## 2019-06-28 MED ORDER — HYDROCHLOROTHIAZIDE 25 MG PO TABS
25.0000 mg | ORAL_TABLET | Freq: Every day | ORAL | Status: DC
  Administered 2019-06-29 – 2019-07-13 (×15): 25 mg via ORAL
  Filled 2019-06-28 (×16): qty 1

## 2019-06-28 MED ORDER — BISACODYL 10 MG RE SUPP
10.0000 mg | Freq: Every day | RECTAL | Status: DC | PRN
  Filled 2019-06-28: qty 1

## 2019-06-28 MED ORDER — FLEET ENEMA 7-19 GM/118ML RE ENEM: 1.0000 | ENEMA | Freq: Once | RECTAL | Status: DC | PRN

## 2019-06-28 MED ORDER — ASPIRIN EC 81 MG PO TBEC
81.0000 mg | DELAYED_RELEASE_TABLET | Freq: Every day | ORAL | Status: DC
  Administered 2019-06-29 – 2019-07-13 (×15): 81 mg via ORAL
  Filled 2019-06-28 (×15): qty 1

## 2019-06-28 MED ORDER — POLYETHYLENE GLYCOL 3350 17 G PO PACK
17.0000 g | PACK | Freq: Every day | ORAL | Status: DC | PRN
  Administered 2019-07-08 – 2019-07-10 (×2): 17 g via ORAL
  Filled 2019-06-28 (×3): qty 1

## 2019-06-28 MED ORDER — FUROSEMIDE 20 MG PO TABS
20.0000 mg | ORAL_TABLET | Freq: Every day | ORAL | Status: DC
  Administered 2019-06-29 – 2019-07-13 (×15): 20 mg via ORAL
  Filled 2019-06-28 (×15): qty 1

## 2019-06-28 MED ORDER — AMLODIPINE BESYLATE 5 MG PO TABS
5.0000 mg | ORAL_TABLET | Freq: Every day | ORAL | Status: DC
  Administered 2019-06-29 – 2019-07-08 (×10): 5 mg via ORAL
  Filled 2019-06-28 (×10): qty 1

## 2019-06-28 MED ORDER — TRAZODONE HCL 50 MG PO TABS: 25.0000 mg | ORAL_TABLET | Freq: Every evening | ORAL | Status: DC | PRN

## 2019-06-28 MED ORDER — SENNOSIDES-DOCUSATE SODIUM 8.6-50 MG PO TABS
2.0000 | ORAL_TABLET | Freq: Every day | ORAL | Status: DC
  Administered 2019-06-28 – 2019-07-12 (×15): 2 via ORAL
  Filled 2019-06-28 (×15): qty 2

## 2019-06-28 MED ORDER — ATORVASTATIN CALCIUM 80 MG PO TABS
80.0000 mg | ORAL_TABLET | Freq: Every day | ORAL | Status: DC
  Administered 2019-06-29 – 2019-07-13 (×15): 80 mg via ORAL
  Filled 2019-06-28 (×15): qty 1

## 2019-06-28 MED ORDER — METFORMIN HCL 500 MG PO TABS
500.0000 mg | ORAL_TABLET | Freq: Two times a day (BID) | ORAL | Status: DC
  Administered 2019-06-28 – 2019-07-13 (×30): 500 mg via ORAL
  Filled 2019-06-28 (×30): qty 1

## 2019-06-28 MED ORDER — AMLODIPINE BESYLATE 5 MG PO TABS: 5.0000 mg | ORAL_TABLET | Freq: Every day | ORAL | 0 refills | Status: DC

## 2019-06-28 MED ORDER — ENOXAPARIN SODIUM 40 MG/0.4ML ~~LOC~~ SOLN
40.0000 mg | SUBCUTANEOUS | Status: DC
  Administered 2019-06-28 – 2019-07-12 (×15): 40 mg via SUBCUTANEOUS
  Filled 2019-06-28 (×15): qty 0.4

## 2019-06-28 MED ORDER — SERTRALINE HCL 50 MG PO TABS
25.0000 mg | ORAL_TABLET | Freq: Every day | ORAL | Status: DC
  Administered 2019-06-29 – 2019-07-13 (×15): 25 mg via ORAL
  Filled 2019-06-28 (×15): qty 1

## 2019-06-28 MED ORDER — ASPIRIN EC 81 MG PO TBEC
81.0000 mg | DELAYED_RELEASE_TABLET | Freq: Every day | ORAL | Status: DC
  Administered 2019-06-28: 81 mg via ORAL
  Filled 2019-06-28: qty 1

## 2019-06-28 MED ORDER — INSULIN ASPART 100 UNIT/ML ~~LOC~~ SOLN
0.0000 [IU] | Freq: Every day | SUBCUTANEOUS | Status: DC
  Administered 2019-06-28 – 2019-06-29 (×2): 2 [IU] via SUBCUTANEOUS

## 2019-06-28 MED ORDER — ACETAMINOPHEN 325 MG PO TABS
325.0000 mg | ORAL_TABLET | ORAL | Status: DC | PRN
  Filled 2019-06-28 (×2): qty 2

## 2019-06-28 MED ORDER — SEMAGLUTIDE(0.25 OR 0.5MG/DOS) 2 MG/1.5ML ~~LOC~~ SOPN: 0.2500 mg | PEN_INJECTOR | SUBCUTANEOUS | Status: DC

## 2019-06-28 MED ORDER — HYDROCHLOROTHIAZIDE 25 MG PO TABS: 25.0000 mg | ORAL_TABLET | Freq: Every day | ORAL | Status: DC

## 2019-06-28 NOTE — Progress Notes (Signed)
Occupational Therapy Treatment Patient Details Name: ARIQ VANDUSER MRN: [id number] DOB: 12-26-1940 Today's Date: 06/28/2019    History of present illness This 79 y.o. male admitted with increased number of falls over the past few weeks.  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  PMH includes:  HTN, DM, depression, CA, h/o brain bleed    OT comments  Patient continues to make steady progress towards goals in skilled OT session. Patient's session encompassed NDT techniques at EOB and increased challenges to balance to increase core engagement and aide in activity tolerance. Pt with need for increased multi-modal cues in session to sit EOB, and required min A to maintain balance when challenged by either rotating L or R. Pt with one LOB posteriorly in session, requiring assistance to correct. Pt able to engage in bridging and bed mobility exercises at end of session. Discharge plans remain appropriate; will continue to follow acutely.    Follow Up Recommendations  CIR;Supervision/Assistance - 24 hour    Equipment Recommendations  None recommended by OT    Recommendations for Other Services      Precautions / Restrictions Precautions Precautions: Fall Precaution Comments: posterior  and R lateral lean and h/o falls Restrictions Weight Bearing Restrictions: No       Mobility Bed Mobility Overal bed mobility: Needs Assistance Bed Mobility: Sidelying to Sit;Supine to Sit     Supine to sit: Mod assist;Max assist Sit to supine: Mod assist;Max assist   General bed mobility comments: assist to scoot R hip to EOB; use of rail, increased cues to sequence movements to date  Transfers                      Balance Overall balance assessment: Needs assistance Sitting-balance support: Feet  supported Sitting balance-Leahy Scale: Fair Sitting balance - Comments: Completed movements at trunk and NDT techniques in session, pt with increased difficulty to maintain balance with minimal challenges                                   ADL either performed or assessed with clinical judgement   ADL                                               Vision       Perception     Praxis      Cognition Arousal/Alertness: Awake/alert Behavior During Therapy: WFL for tasks assessed/performed Overall Cognitive Status: Impaired/Different from baseline Area of Impairment: Problem solving;Orientation;Attention;Memory;Following commands;Safety/judgement;Awareness                 Orientation Level: Disoriented to;Situation;Place Current Attention Level: Sustained Memory: Decreased short-term memory Following Commands: Follows one step commands with increased time Safety/Judgement: Decreased awareness of safety;Decreased awareness of deficits Awareness: Emergent Problem Solving: Slow processing;Difficulty sequencing;Requires verbal cues;Requires tactile cues;Decreased initiation General Comments: Increased time needed, but pleasant and motivated        Exercises     Shoulder Instructions       General Comments      Pertinent Vitals/ Pain       Pain Assessment: No/denies pain  Home Living  Prior Functioning/Environment              Frequency  Min 2X/week        Progress Toward Goals  OT Goals(current goals can now be found in the care plan section)  Progress towards OT goals: Progressing toward goals  Acute Rehab OT Goals Patient Stated Goal: to go to rehab OT Goal Formulation: With patient/family Time For Goal Achievement: 07/09/19 Potential to Achieve Goals: Good  Plan Discharge plan remains appropriate    Co-evaluation                 AM-PAC OT "6  Clicks" Daily Activity     Outcome Measure   Help from another person eating meals?: A Little Help from another person taking care of personal grooming?: A Little Help from another person toileting, which includes using toliet, bedpan, or urinal?: A Lot Help from another person bathing (including washing, rinsing, drying)?: A Lot Help from another person to put on and taking off regular upper body clothing?: A Lot Help from another person to put on and taking off regular lower body clothing?: A Lot 6 Click Score: 14    End of Session    OT Visit Diagnosis: Unsteadiness on feet (R26.81);Muscle weakness (generalized) (M62.81);Cognitive communication deficit (R41.841) Symptoms and signs involving cognitive functions: Cerebral infarction   Activity Tolerance Patient tolerated treatment well   Patient Left in bed;with call bell/phone within reach;with bed alarm set;with family/visitor present   Nurse Communication Mobility status        Time: 1330-1350 OT Time Calculation (min): 20 min  Charges: OT General Charges $OT Visit: 1 Visit OT Treatments $Neuromuscular Re-education: 8-22 mins  Corinne Ports E. Rorey Hodges, COTA/L Acute Rehabilitation Services [place] 06/28/2019, 2:24 PM

## 2019-06-28 NOTE — Progress Notes (Signed)
Courtney Heys, MD  Physician  Physical Medicine and Rehabilitation  Consult Note      Signed  Date of Service:  06/26/2019  9:25 AM      Related encounter: ED to Hosp-Admission (Current) from 06/24/2019 in [facility] [facility]      Signed      Expand AllCollapse All   Show:Clear all [x] Manual[x] Template[] Copied  Added by: [x] Love, Ivan Anchors, PA-C[x] Lovorn, Jinny Blossom, MD  [] Hover for details          Physical Medicine and Rehabilitation Consult     Reason for Consult: Functional decline due to stroke/history of multiple strokes.  Referring Physician: Dr. Daryll Drown     HPI: Justin [name] is a 79 y.o. 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 He was admitted on 06/24/19 with multiple falls in the past week requiring walker, speech difficulty and weakness.  MRI brain done revealing 2 small acute infracts in R-MCA territory and advanced chronic ischemic changes with widespread atrophy advanced for age.  CTA head neck was negative for high grade occlusion or stenosis but multifocal areas of atherosclerotic irregularity. Carotid dopplers was negative for ICA stenosis. BLE dopplers negative for DVT. 2 D echo showed EF 70-75%, hyperdynamic function with grade I diastolic dysfunction and no shunt. Dr. Erlinda Hong felt that stroke was due to small vessel disease but recommended 30 day event monitor to rule out A fib. Therapy evaluations completed yesterday revealing balance deficits with right lateral lean, impaired cognition with decreased activity tolerance. CIR recommended due to functional decline.      Pt reports LBM yesterday but still feels constipated Has condom catheter currently-  Denies feeling tired or being in pain.      Review of Systems  Constitutional: Negative for chills and fever.  HENT: Negative for hearing loss.   Eyes: Negative for blurred vision.   Respiratory: Negative for cough and shortness of breath.   Cardiovascular: Negative for chest pain and palpitations.  Gastrointestinal: Negative for heartburn and nausea.  Musculoskeletal: Positive for back pain and joint pain.  Skin: Negative for rash.  Neurological: Positive for speech change, focal weakness and weakness. Negative for dizziness and headaches.  Psychiatric/Behavioral: Positive for memory loss.  All other systems reviewed and are negative.         Past Medical History:  Diagnosis Date  . Brain bleed ([place])    . Cancer [facility])      prostate  . CHI (closed head injury) 04/2013    with SAH  . Depression    . Diabetes mellitus without complication ([place])    . High cholesterol    . Hypertension    . Stroke [facility])             Past Surgical History:  Procedure Laterality Date  . arm & hand surgery Left 1972    skill saw accident  . COLON SURGERY        partial  . EYE SURGERY        lens implant and cateract- Left  . PROSTATECTOMY               Family History  Problem Relation Age of Onset  . CAD Mother    . Diabetes Mother    . Emphysema Father    . Diabetes Sister    . Diabetes Brother    . Diabetes Sister    . Diabetes Brother    .  Diabetes Mellitus II Neg Hx        Social History:  Married. 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 Has been very sedentary for years-- watches TV most of the day.   Was independent with cane PTA. He reports that he has never smoked. He has never used smokeless tobacco. He reports that he does not drink alcohol or use drugs.      Allergies: No Known Allergies      Medications Prior to Admission  Medication Sig Dispense Refill  . atorvastatin (LIPITOR) 80 MG tablet TAKE 1 TABLET EVERY DAY (Patient taking differently: Take 80 mg by mouth daily. ) 90 tablet 0  . Cholecalciferol (VITAMIN D-1000 MAX ST) 1000 units tablet Take 1,000 Units by mouth daily.      . citalopram (CELEXA) 10 MG tablet Take 0.5  tablets (5 mg total) by mouth daily for 8 days. (Patient taking differently: Take 20 mg by mouth in the morning and at bedtime. ) 4 tablet 0  . clopidogrel (PLAVIX) 75 MG tablet Take 1 tablet (75 mg total) by mouth daily. 90 tablet 4  . cyanocobalamin ([employer] VITAMIN B12) 1000 MCG tablet Take 1,000 mcg by mouth daily.       . fenofibrate 160 MG tablet Take 160 mg by mouth daily.      . furosemide (LASIX) 20 MG tablet Take 20 mg by mouth daily.       Marland Kitchen glipiZIDE (GLUCOTROL) 10 MG tablet Take 10 mg by mouth daily.       Marland Kitchen losartan (COZAAR) 100 MG tablet Take 100 mg by mouth daily.      . metFORMIN (GLUCOPHAGE) 500 MG tablet Take 1,000 mg by mouth 2 (two) times daily with a meal.      . mirabegron ER (MYRBETRIQ) 50 MG TB24 tablet Take 50 mg by mouth daily.      . Omega-3 Fatty Acids (FISH OIL) 1000 MG CAPS Take 1,000 mg by mouth daily with breakfast.      . polyvinyl alcohol (LIQUIFILM TEARS) 1.4 % ophthalmic solution Place 1 drop into both eyes 4 (four) times daily as needed for dry eyes.      Marland Kitchen PRESCRIPTION MEDICATION Inject 3-13 Units into the skin daily. Takes 13 units in the morning and 3 units at night. Patient not sure about the name of insulin.      . Semaglutide (OZEMPIC) 0.25 or 0.5 MG/DOSE SOPN Inject 0.25 mg into the skin once a week.      . sertraline (ZOLOFT) 25 MG tablet Take 1 tablet (25 mg total) by mouth daily. 90 tablet 1      Home: Home Living Family/patient expects to be discharged to:: Private residence Living Arrangements: Spouse/significant other Available Help at Discharge: Family, Available PRN/intermittently Type of Home: House Home Access: Stairs to enter Technical brewer of Steps: 3 Entrance Stairs-Rails: Right Home Layout: One level Bathroom Shower/Tub: Multimedia programmer: Handicapped height Bathroom Accessibility: Yes Home Equipment: Engineer, manufacturing held shower head, Shower seat, Radio producer - quad, Environmental consultant - 2 wheels Additional Comments: Pt reports he lives with  wife who is able to assist some   Functional History: Prior Function Level of Independence: Needs assistance Gait / Transfers Assistance Needed: Pts wife reports he just started using RW more since falling. Has had difficulty getting up out of a chair the past few days.  ADL's / Homemaking Assistance Needed: Pt reports he is able to perform ADLs mod I.  wife  assists with IADLs and with medication management  Functional Status:  Mobility: Bed Mobility Overal bed mobility: Needs Assistance Bed Mobility: Supine to Sit, Sit to Supine Supine to sit: Mod assist Sit to supine: Mod assist General bed mobility comments: Mod A for trunk assist and LE assist. REquired assist to scoot R hip towards EOB. Pt with R lateral lean in sitting and holding to bed rails.  Transfers Overall transfer level: Needs assistance Equipment used: Rolling walker (2 wheeled) Transfers: Sit to/from Stand Sit to Stand: Max assist General transfer comment: Max A to perform partial stand. Pt feet sliding forward and pt with posterior lean. Unable to come to full upright posture.    ADL: ADL Overall ADL's : Needs assistance/impaired Eating/Feeding: Set up, Bed level Grooming: Wash/dry hands, Wash/dry face, Oral care, Brushing hair, Minimal assistance, Bed level Upper Body Bathing: Sitting, Moderate assistance Lower Body Bathing: Maximal assistance, Bed level Upper Body Dressing : Maximal assistance, Sitting Lower Body Dressing: Total assistance, Bed level Toilet Transfer: Total assistance Toilet Transfer Details (indicate cue type and reason): unable to safely perform  Toileting- Clothing Manipulation and Hygiene: Total assistance, Bed level Toileting - Clothing Manipulation Details (indicate cue type and reason): per RN, pt was in diaper and saturated with urine this am, and required total A for peri care and clean up  Functional mobility during ADLs: Maximal assistance, Moderate assistance    Cognition: Cognition Overall Cognitive Status: Impaired/Different from baseline Orientation Level: Oriented X4 Cognition Arousal/Alertness: Awake/alert Behavior During Therapy: WFL for tasks assessed/performed Overall Cognitive Status: Impaired/Different from baseline Area of Impairment: Problem solving, Following commands Following Commands: Follows one step commands with increased time Problem Solving: Slow processing, Decreased initiation, Difficulty sequencing, Requires verbal cues, Requires tactile cues General Comments: Short Blessed Test administered.  He scored 15/28 which is indicative of significant cognitive deficit.  He demonstrated deficits with attention, organization, sequencing, and memory      Blood pressure 133/71, pulse 76, temperature 97.9 F (36.6 C), temperature source Oral, resp. rate 19, SpO2 99 %. Physical Exam  Nursing note and vitals reviewed. Constitutional: He appears well-developed and well-nourished.  Pt is an elderly male who appears stated age, sitting up in bedside chair, eating last of lunch at 12:45 pm, NAD No one else in room  HENT:  Head: Normocephalic and atraumatic.  Nose: Nose normal.  Mouth/Throat: Oropharynx is clear and moist. No oropharyngeal exudate.  Facial sensation intact B/L No facial droop seen Tongue midline  Eyes:  EOMI B/L No nystagmus   Neck: No tracheal deviation present.  Cardiovascular:  RRR- no JVD  Respiratory: No stridor.  CTA B/L- slightly coarse, but no W/R/R  GI:  Soft, NT, slightly distended, (+)BS  Genitourinary:    Genitourinary Comments: Condom catheter intact   Musculoskeletal:     Cervical back: Normal range of motion and neck supple.     Comments: RUE- biceps 4/5, triceps 4/5, WE/grip 4/5, finger abd 4-/5 LUE_ 5-/5 RLE- HF 2+/5, KE 4-/5, KF 4-/5, DF and PF 4/5 LLE- HF 3+/5, KE 4+/5, KF 4+/5, DF and PF 5-/5   Neurological: He is alert.  Oriented to self and situation--unable to state place. Has  delayed processing with cognitive deficits--needed max cues from wife to answer basic questions.  Flat affect with expressive>receptive deficits. LUE weakness noted.   Was able to answer questions, but no spontaneous speech,  sensaiton intact to light touch in all 4 extremities, except stocking to mid calves B/L  Skin: Skin is warm  and dry.  R elbow skinned slightly- nickel sized- dressing on it B/L IVs in arms- look OK- no infiltrate.   Psychiatric:  Vague, a little confused, Ox2; pleasant; low energy      Lab Results Last 24 Hours       Results for orders placed or performed during the hospital encounter of 06/24/19 (from the past 24 hour(s))  Hemoglobin A1c     Status: Abnormal    Collection Time: 06/25/19  9:52 AM  Result Value Ref Range    Hgb A1c MFr Bld 7.2 (H) 4.8 - 5.6 %    Mean Plasma Glucose 159.94 mg/dL  Glucose, capillary     Status: None    Collection Time: 06/25/19  1:24 PM  Result Value Ref Range    Glucose-Capillary 86 70 - 99 mg/dL    Comment 1 Notify RN      Comment 2 Document in Chart    Glucose, capillary     Status: Abnormal    Collection Time: 06/25/19  4:10 PM  Result Value Ref Range    Glucose-Capillary 188 (H) 70 - 99 mg/dL    Comment 1 Notify RN      Comment 2 Document in Chart    Glucose, capillary     Status: Abnormal    Collection Time: 06/25/19  9:25 PM  Result Value Ref Range    Glucose-Capillary 168 (H) 70 - 99 mg/dL    Comment 1 Notify RN      Comment 2 Document in Chart    CBC     Status: None    Collection Time: 06/26/19  2:31 AM  Result Value Ref Range    WBC 9.1 4.0 - 10.5 K/uL    RBC 5.39 4.22 - 5.81 MIL/uL    Hemoglobin 15.2 13.0 - 17.0 g/dL    HCT 45.1 39.0 - 52.0 %    MCV 83.7 80.0 - 100.0 fL    MCH 28.2 26.0 - 34.0 pg    MCHC 33.7 30.0 - 36.0 g/dL    RDW 12.7 11.5 - 15.5 %    Platelets 252 150 - 400 K/uL    nRBC 0.0 0.0 - 0.2 %  Basic metabolic panel     Status: Abnormal    Collection Time: 06/26/19  2:31 AM  Result Value  Ref Range    Sodium 138 135 - 145 mmol/L    Potassium 3.7 3.5 - 5.1 mmol/L    Chloride 102 98 - 111 mmol/L    CO2 26 22 - 32 mmol/L    Glucose, Bld 128 (H) 70 - 99 mg/dL    BUN 15 8 - 23 mg/dL    Creatinine, Ser 1.28 (H) 0.61 - 1.24 mg/dL    Calcium 8.9 8.9 - 10.3 mg/dL    GFR calc non Af Amer 54 (L) >60 mL/min    GFR calc Af Amer >60 >60 mL/min    Anion gap 10 5 - 15  Glucose, capillary     Status: Abnormal    Collection Time: 06/26/19  6:05 AM  Result Value Ref Range    Glucose-Capillary 125 (H) 70 - 99 mg/dL    Comment 1 Notify RN      Comment 2 Document in Chart         Imaging Results (Last 48 hours)  CT ANGIO HEAD W OR WO CONTRAST   Result Date: 06/24/2019 CLINICAL DATA:  Stroke follow-up EXAM: CT ANGIOGRAPHY HEAD TECHNIQUE: Multidetector CT imaging of the head  was performed using the standard protocol during bolus administration of intravenous contrast. Multiplanar CT image reconstructions and MIPs were obtained to evaluate the vascular anatomy. CONTRAST:  94mL OMNIPAQUE IOHEXOL 350 MG/ML SOLN COMPARISON:  None. FINDINGS: POSTERIOR CIRCULATION: --Vertebral arteries: Normal V4 segments. --Posterior inferior cerebellar arteries (PICA): Patent origins from the vertebral arteries. --Anterior inferior cerebellar arteries (AICA): Patent origins from the basilar artery. --Basilar artery: Normal. --Superior cerebellar arteries: Normal. --Posterior cerebral arteries: Multifocal irregularity without occlusion. ANTERIOR CIRCULATION: --Intracranial internal carotid arteries: Atherosclerotic calcification without high-grade stenosis. --Anterior cerebral arteries (ACA): Patent with multiple areas of atherosclerotic irregularity. --Middle cerebral arteries (MCA): Patent multifocal atherosclerotic irregularity. Venous sinuses: As permitted by contrast timing, patent. Anatomic variants: Left P-comm IMPRESSION: 1. No intracranial arterial occlusion or high-grade stenosis. 2. Multifocal atherosclerotic  irregularity of the intracranial arteries. Electronically Signed   By: Ulyses Jarred M.D.   On: 06/24/2019 21:28    DG Chest 2 View   Result Date: 06/24/2019 CLINICAL DATA:  Multiple falls EXAM: CHEST - 2 VIEW COMPARISON:  04/22/2013 chest radiograph. FINDINGS: Stable cardiomediastinal silhouette with normal heart size. No pneumothorax. No pleural effusion. Lungs appear clear, with no acute consolidative airspace disease and no pulmonary edema. No displaced fractures in the visualized chest. IMPRESSION: No active cardiopulmonary disease. Electronically Signed   By: Ilona Sorrel M.D.   On: 06/24/2019 10:55    CT HEAD WO CONTRAST   Result Date: 06/24/2019 CLINICAL DATA:  Multiple falls in past few days, unable to walk safely, LEFT hip pain and bruising, knee pain, on blood thinners, question stroke, history prostate cancer, hypertension, diabetes mellitus, stroke EXAM: CT HEAD WITHOUT CONTRAST TECHNIQUE: Contiguous axial images were obtained from the base of the skull through the vertex without intravenous contrast. Sagittal and coronal MPR images reconstructed from axial data set. COMPARISON:  10/03/2018 FINDINGS: Brain: Generalized atrophy. Ex vacuo dilatation of the LEFT lateral ventricle. No midline shift or mass effect. Small vessel chronic ischemic changes of deep cerebral white matter. Old infarct anterior LEFT basal ganglia and periventricular white matter. Small old RIGHT basal ganglia and RIGHT thalamic infarcts. No intracranial hemorrhage, mass lesion, or evidence of acute infarction. No extra-axial fluid collections. Vascular: No hyperdense vessels. Mild atherosclerotic calcification of internal carotid arteries bilaterally at skull base Skull: Intact Sinuses/Orbits: Clear Other: N/A IMPRESSION: Atrophy with small vessel chronic ischemic changes of deep cerebral white matter. Old BILATERAL basal ganglia and RIGHT thalamic lacunar infarcts as well as old LEFT frontal periventricular white matter  infarct. No acute intracranial abnormalities. Electronically Signed   By: Lavonia Dana M.D.   On: 06/24/2019 10:49    MR BRAIN WO CONTRAST   Result Date: 06/24/2019 CLINICAL DATA:  Suspected stroke. Multiple recent falls. History of prostate carcinoma. EXAM: MRI HEAD WITHOUT CONTRAST TECHNIQUE: Multiplanar, multiecho pulse sequences of the brain and surrounding structures were obtained without intravenous contrast. COMPARISON:  None. FINDINGS: Brain: Multifocal abnormal diffusion restriction within the right MCA territory, at the centrum semiovale and posterior corona radiata. No contralateral diffusion abnormality. No acute hemorrhage. There are multiple old small vessel infarcts and white matter changes of advanced chronic ischemic microangiopathy. There is widespread atrophy greater than expected for age. The midline structures are normal. There are some areas of magnetic susceptibility effect associated with the older infarcts consistent with remote hemorrhage. Vascular: Normal flow voids Skull and upper cervical spine: Normal marrow signal Sinuses/Orbits: Paranasal sinuses and mastoids are clear. Normal orbits. Other: None IMPRESSION: 1. There are 2 small acute infarcts within the  right MCA territory, at the centrum semiovale and posterior corona radiata. No acute hemorrhage or mass effect. 2. Findings of advanced chronic ischemic microangiopathy and widespread atrophy greater than expected for age. Electronically Signed   By: Ulyses Jarred M.D.   On: 06/24/2019 19:44    DG Knee Complete 4 Views Left   Result Date: 06/24/2019 CLINICAL DATA:  Multiple falls. Left knee pain. EXAM: LEFT KNEE - COMPLETE 4+ VIEW COMPARISON:  None. FINDINGS: No fracture, joint effusion or dislocation. No focal osseous lesions. Mild tricompartmental osteoarthritis, most prominent in the patellofemoral compartment. No radiopaque foreign bodies. IMPRESSION: No left knee fracture, joint effusion or dislocation. Mild tricompartmental  left knee osteoarthritis, most prominent in the patellofemoral compartment. Electronically Signed   By: Ilona Sorrel M.D.   On: 06/24/2019 10:59    ECHOCARDIOGRAM COMPLETE   Result Date: 06/25/2019    ECHOCARDIOGRAM REPORT   Patient Name:   RISHI GEMMELL Date of Exam: 06/25/2019 Medical Rec #:  [id number]     Height:       72.0 in Accession #:    [id number]    Weight:       238.6 lb Date of Birth:  11-Nov-1940    BSA:          2.296 m Patient Age:    7 years      BP:           162/76 mmHg Patient Gender: M             HR:           60 bpm. Exam Location:  Inpatient Procedure: 2D Echo and Intracardiac Opacification Agent Indications:    Stroke 434.91 / I163.9  History:        Patient has prior history of Echocardiogram examinations, most                 recent 10/04/2018. Risk Factors:Hypertension, Diabetes and                 Dyslipidemia. History of cancer.  Sonographer:    Darlina Sicilian RDCS Referring Phys: [place]  1. Left ventricular ejection fraction, by estimation, is 70 to 75%. The left ventricle has hyperdynamic function. The left ventricle has no regional wall motion abnormalities. Left ventricular diastolic parameters are consistent with Grade I diastolic dysfunction (impaired relaxation).  2. Right ventricular systolic function is normal. The right ventricular size is normal.  3. The mitral valve is normal in structure. No evidence of mitral valve regurgitation. No evidence of mitral stenosis.  4. The aortic valve is normal in structure. Aortic valve regurgitation is not visualized. No aortic stenosis is present.  5. The inferior vena cava is normal in size with greater than 50% respiratory variability, suggesting right atrial pressure of 3 mmHg. Conclusion(s)/Recommendation(s): No intracardiac source of embolism detected on this transthoracic study. A transesophageal echocardiogram is recommended to exclude cardiac source of embolism if clinically indicated. FINDINGS  Left  Ventricle: Left ventricular ejection fraction, by estimation, is 70 to 75%. The left ventricle has hyperdynamic function. The left ventricle has no regional wall motion abnormalities. Definity contrast agent was given IV to delineate the left ventricular endocardial borders. The left ventricular internal cavity size was normal in size. There is no left ventricular hypertrophy. Left ventricular diastolic parameters are consistent with Grade I diastolic dysfunction (impaired relaxation). Right Ventricle: The right ventricular size is normal. No increase in right ventricular wall thickness. Right ventricular systolic function is  normal. Left Atrium: Left atrial size was normal in size. Right Atrium: Right atrial size was normal in size. Pericardium: There is no evidence of pericardial effusion. Mitral Valve: The mitral valve is normal in structure. Normal mobility of the mitral valve leaflets. No evidence of mitral valve regurgitation. No evidence of mitral valve stenosis. Tricuspid Valve: The tricuspid valve is normal in structure. Tricuspid valve regurgitation is not demonstrated. No evidence of tricuspid stenosis. Aortic Valve: The aortic valve is normal in structure. Aortic valve regurgitation is not visualized. No aortic stenosis is present. Pulmonic Valve: The pulmonic valve was normal in structure. Pulmonic valve regurgitation is not visualized. No evidence of pulmonic stenosis. Aorta: The aortic root is normal in size and structure. Venous: The inferior vena cava is normal in size with greater than 50% respiratory variability, suggesting right atrial pressure of 3 mmHg. IAS/Shunts: No atrial level shunt detected by color flow Doppler.  LEFT VENTRICLE PLAX 2D LVIDd:         4.78 cm      Diastology LVIDs:         2.91 cm      LV e' lateral:   3.26 cm/s LV PW:         0.86 cm      LV E/e' lateral: 17.5 LV IVS:        1.14 cm      LV e' medial:    4.68 cm/s LVOT diam:     1.70 cm      LV E/e' medial:  12.2 LV SV:          33 LV SV Index:   14 LVOT Area:     2.27 cm  LV Volumes (MOD) LV vol d, MOD A2C: 117.0 ml LV vol d, MOD A4C: 109.0 ml LV vol s, MOD A2C: 42.3 ml LV vol s, MOD A4C: 31.3 ml LV SV MOD A2C:     74.7 ml LV SV MOD A4C:     109.0 ml LV SV MOD BP:      79.0 ml RIGHT VENTRICLE RV S prime:     11.20 cm/s TAPSE (M-mode): 1.6 cm LEFT ATRIUM             Index LA diam:        2.70 cm 1.18 cm/m LA Vol (A2C):   42.1 ml 18.33 ml/m LA Vol (A4C):   45.4 ml 19.77 ml/m LA Biplane Vol: 43.6 ml 18.99 ml/m  AORTIC VALVE LVOT Vmax:   82.90 cm/s LVOT Vmean:  46.200 cm/s LVOT VTI:    0.146 m  AORTA Ao Root diam: 3.30 cm MITRAL VALVE MV Area (PHT): 2.95 cm    SHUNTS MV Decel Time: 257 msec    Systemic VTI:  0.15 m MV E velocity: 57.00 cm/s  Systemic Diam: 1.70 cm MV A velocity: 66.40 cm/s MV E/A ratio:  0.86 Candee Furbish MD Electronically signed by Candee Furbish MD Signature Date/Time: 06/25/2019/12:59:19 PM    Final     DG Hip Unilat W or Wo Pelvis 2-3 Views Left   Result Date: 06/24/2019 CLINICAL DATA:  Multiple falls, left hip pain EXAM: DG HIP (WITH OR WITHOUT PELVIS) 2-3V LEFT COMPARISON:  None. FINDINGS: No left hip fracture or dislocation. No pelvic fracture or diastasis. No focal osseous lesions. Degenerative changes in the visualized lower lumbar spine. No radiopaque foreign bodies. No significant left hip arthropathy. IMPRESSION: No fracture. No left hip malalignment. Electronically Signed   By: Janina Mayo.D.  On: 06/24/2019 10:58    VAS US CAROTID (at [facility] and [facility] only)   Result Date: 06/26/2019 Carotid Arterial Duplex Study Indications:       Weakness and unsteadiness and falls at home. Risk Factors:      Hypertension, Diabetes, prior CVA. Comparison Study:  Carotid study on 09/18/2017 - 1-39% stenosis. Performing Technologist: Oda Cogan RDMS, RVT  Examination Guidelines: A complete evaluation includes B-mode imaging, spectral Doppler, color Doppler, and power Doppler as needed of all accessible portions of  each vessel. Bilateral testing is considered an integral part of a complete examination. Limited examinations for reoccurring indications may be performed as noted.  Right Carotid Findings: +----------+--------+--------+--------+------------------+------------------+           PSV cm/sEDV cm/sStenosisPlaque DescriptionComments           +----------+--------+--------+--------+------------------+------------------+ CCA Prox  58      14                                                   +----------+--------+--------+--------+------------------+------------------+ CCA Distal56      14                                intimal thickening +----------+--------+--------+--------+------------------+------------------+ ICA Prox  59      17      1-39%                     intimal thickening +----------+--------+--------+--------+------------------+------------------+ ICA Distal56      21                                                   +----------+--------+--------+--------+------------------+------------------+ ECA       112     19                                                   +----------+--------+--------+--------+------------------+------------------+ +----------+--------+-------+----------------+-------------------+           PSV cm/sEDV cmsDescribe        Arm Pressure (mmHG) +----------+--------+-------+----------------+-------------------+ [id number]             Multiphasic, WNL                    +----------+--------+-------+----------------+-------------------+ +---------+--------+--+--------+--+---------+ VertebralPSV cm/s56EDV cm/s16Antegrade +---------+--------+--+--------+--+---------+  Left Carotid Findings: +----------+--------+--------+--------+------------------+------------------+           PSV cm/sEDV cm/sStenosisPlaque DescriptionComments           +----------+--------+--------+--------+------------------+------------------+ CCA Prox  97       21                                                   +----------+--------+--------+--------+------------------+------------------+ CCA Distal72      17  intimal thickening +----------+--------+--------+--------+------------------+------------------+ ICA Prox  86      20      1-39%   calcific                             +----------+--------+--------+--------+------------------+------------------+ ICA Distal53      18                                                   +----------+--------+--------+--------+------------------+------------------+ ECA       87      11                                                   +----------+--------+--------+--------+------------------+------------------+ +----------+--------+--------+----------------+-------------------+           PSV cm/sEDV cm/sDescribe        Arm Pressure (mmHG) +----------+--------+--------+----------------+-------------------+ Subclavian110             Multiphasic, WNL                    +----------+--------+--------+----------------+-------------------+ +---------+--------+--+--------+--+---------+ VertebralPSV cm/s41EDV cm/s15Antegrade +---------+--------+--+--------+--+---------+   Summary: Right Carotid: Velocities in the right ICA are consistent with a 1-39% stenosis. Left Carotid: Velocities in the left ICA are consistent with a 1-39% stenosis. Vertebrals:  Bilateral vertebral arteries demonstrate antegrade flow. Subclavians: Normal flow hemodynamics were seen in bilateral subclavian              arteries. *See table(s) above for measurements and observations.  Electronically signed by Antony Contras MD on 06/26/2019 at 8:52:19 AM.    Final     VAS [place] LOWER EXTREMITY VENOUS (DVT)   Result Date: 06/25/2019  Lower Venous DVTStudy Indications: Edema. Other Indications: LLE weakness and left knee and hip pain. Performing Technologist: Oda Cogan RDMS, RVT  Examination  Guidelines: A complete evaluation includes B-mode imaging, spectral Doppler, color Doppler, and power Doppler as needed of all accessible portions of each vessel. Bilateral testing is considered an integral part of a complete examination. Limited examinations for reoccurring indications may be performed as noted. The reflux portion of the exam is performed with the patient in reverse Trendelenburg.  +---------+---------------+---------+-----------+----------+--------------+ RIGHT    CompressibilityPhasicitySpontaneityPropertiesThrombus Aging +---------+---------------+---------+-----------+----------+--------------+ CFV      Full           Yes      Yes                                 +---------+---------------+---------+-----------+----------+--------------+ SFJ      Full                                                        +---------+---------------+---------+-----------+----------+--------------+ FV Prox  Full                                                        +---------+---------------+---------+-----------+----------+--------------+  FV Mid   Full                                                        +---------+---------------+---------+-----------+----------+--------------+ FV DistalFull                                                        +---------+---------------+---------+-----------+----------+--------------+ PFV      Full                                                        +---------+---------------+---------+-----------+----------+--------------+ POP      Full           Yes      Yes                                 +---------+---------------+---------+-----------+----------+--------------+ PTV      Full                                                        +---------+---------------+---------+-----------+----------+--------------+ PERO     Full                                                         +---------+---------------+---------+-----------+----------+--------------+   +----+---------------+---------+-----------+----------+--------------+ LEFTCompressibilityPhasicitySpontaneityPropertiesThrombus Aging +----+---------------+---------+-----------+----------+--------------+ CFV Full           Yes      Yes                                 +----+---------------+---------+-----------+----------+--------------+ SFJ Full                                                        +----+---------------+---------+-----------+----------+--------------+     Summary: RIGHT: - There is no evidence of deep vein thrombosis in the lower extremity.  - No cystic structure found in the popliteal fossa.  LEFT: - No evidence of common femoral vein obstruction.  *See table(s) above for measurements and observations. Electronically signed by Ruta Hinds MD on 06/25/2019 at 3:44:56 PM.    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 Does the need for close, 24 hr/day medical supervision in concert with the patient's rehab needs make it unreasonable for this patient to be served in  a less intensive setting? Potentially 2. 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 Due to bladder management, bowel management, safety, skin/wound care, disease management, medication administration, pain management and patient education, does the patient require 24 hr/day rehab nursing? Yes 4. Does the patient require coordinated care of a physician, rehab nurse, therapy disciplines of PT, OT and SLP to address physical and functional deficits in the context of the above medical diagnosis(es)? Yes Addressing deficits in the following areas: balance, endurance, locomotion, strength, transferring, bathing, dressing, feeding, grooming, toileting, cognition,  speech and language 5. Can the patient actively participate in an intensive therapy program of at least 3 hrs of therapy per day at least 5 days per week? Yes 6. The potential for patient to make measurable gains while on inpatient rehab is fair 7. Anticipated functional outcomes upon discharge from inpatient rehab are supervision and min assist  with PT, supervision and min assist with OT, supervision and min assist with SLP. 8. Estimated rehab length of stay to reach the above functional goals is: 7-10 days 9. Anticipated discharge destination: Home 10. Overall Rehab/Functional Prognosis: fair   RECOMMENDATIONS: This patient's condition is appropriate for continued rehabilitative care in the following setting: CIR Patient has agreed to participate in recommended program. Potentially Note that insurance prior authorization may be required for reimbursement for recommended care.   Comment:  1. Patient cognitively is unable ot decide, I think if appropriate for CIR- but he did know CIR was "upstairs". 2. Wife stated per PA pt is low level at home- mainly sits in recliner and watching TV- he is participating well with therapy, but Cognitive issues are impairing his ability to participate somewhat- will need SLP, but knowing has previous TBI based on chart.  3. Suggest working on constipation- increase bowel meds- suggest senokot 4. Pt is an appropriate candidate for short term CIR to get back closer to baseline- wife said would take him home.  5. Thank you for this consult- will have admission coordinators see pt.          Bary Leriche, PA-C 06/26/2019    I have personally performed a face to face diagnostic evaluation of this patient and formulated the key components of the plan.  Additionally, I have personally reviewed laboratory data, imaging studies, as well as relevant notes and concur with the physician assistant's documentation above.          Revision History                      Routing History

## 2019-06-28 NOTE — Progress Notes (Signed)
Pt arrived to unit via bed, pt is alert and oriented but does require time to answer questions, no c/o pain, reports taking Ozempic every Monday, condom cath on at this time, CBG taken on prev floor prior to transport and insulin was administered. Oriented pt to rehab. Visitors are [name] and son Mali.

## 2019-06-28 NOTE — TOC Transition Note (Signed)
Transition of Care [facility]) - CM/SW Discharge Note   Patient Details  Name: Justin [name] MRN: [id number] Date of Birth: [birth year]  [facility] CM/SW Contact:  Pollie Friar, RN Phone Number: 06/28/2019, 1:42 PM   Clinical Narrative:    Pt discharging to CIR today. CM signing off.   Final next level of care: IP Rehab Facility Barriers to Discharge: No Barriers Identified   Patient Goals and CMS Choice        Discharge Placement                       Discharge Plan and Services                                     Social Determinants of Health (SDOH) Interventions     Readmission Risk Interventions No flowsheet data found.

## 2019-06-28 NOTE — H&P (Signed)
Physical Medicine and Rehabilitation Admission H&P    Chief Complaint  Patient presents with  .  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  He was admitted on 06/24/2019 with multiple falls in the week prior to admission, speech difficulty and weakness.  MRI brain done revealing 2 small acute infarcts in right MCA territory and advanced chronic ischemic changes with widespread atrophy advanced for age.  CTA head/neck was negative for high-grade occlusion or stenosis but multifocal areas of atherosclerotic irregularity noted.  Carotid Dopplers were negative ICA stenosis.  BLE Dopplers negative for DVT.  2D echo showed EF of 70 to 75%, hyperdynamic function with grade 1 diastolic dysfunction and no shunt.  Dr. Erlinda Hong felt the stroke was due to small vessel disease and recommended DAPT x3 months followed by Plavix alone as well as 30-day event monitor to rule out A. fib given 2 separate stroke locations.--Cardiology to be contacted for event monitor prior to discharge..  Patient with resultant balance deficits, right lateral lean, impaired cognition with decrease in activity tolerance.  CIR was recommended due to functional decline  Wife very concerned that [name], and could benefit from Amantadine.    Review of Systems  Constitutional: Negative for chills and fever.  HENT: Positive for hearing loss. Negative for tinnitus.   Eyes: Negative for blurred vision and double vision.  Respiratory: Positive for cough. Negative for shortness of breath and wheezing.   Cardiovascular: Negative for chest pain and leg swelling.  Gastrointestinal:  Negative for constipation, heartburn and nausea.  Genitourinary: Positive for frequency and urgency.       Wear depends.   Musculoskeletal: Negative for myalgias.  Skin: Negative for itching and rash.  Neurological: Positive for focal weakness. Negative for dizziness and headaches.  All other systems reviewed and are negative.     Past Medical History:  Diagnosis Date  . Brain bleed ([place])   . Cancer [facility])    prostate  . 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 Depression   . Diabetes mellitus without complication ([place])   . High cholesterol   . Hypertension   . Stroke [facility])     Past Surgical History:  Procedure Laterality Date  . arm & hand surgery Left 1972   skill saw accident  . COLON SURGERY     partial  . EYE SURGERY     lens implant and cateract- Left  . PROSTATECTOMY      Family History  Problem Relation Age of Onset  . CAD Mother   . Diabetes Mother   . Emphysema Father   . Diabetes Sister   . Diabetes Brother   . Diabetes Sister   . Diabetes Brother   . Diabetes Mellitus II Neg Hx     Social History: Married.  He used to work as a Clinical cytogeneticist for [employer] and was in [employer] for 3 years.  Retired 15 years ago and has been very sedentary.  He was independent with use of a cane PTA.  He reports that he has never smoked.  He has never used smokeless tobacco and  does not use alcohol or drugs.   Allergies: No Known Allergies    Medications Prior to Admission  Medication Sig Dispense Refill  . Semaglutide (OZEMPIC) 0.25 or 0.5 MG/DOSE SOPN Inject 0.25 mg into the skin once a week.    Derrill Memo ON 06/29/2019] amLODipine (NORVASC) 5 MG tablet Take 1 tablet (5 mg total) by mouth daily. 90 tablet 0  . [START ON 06/29/2019] aspirin EC 81 MG EC tablet Take 1 tablet (81 mg total) by mouth daily. 90 tablet 0  . atorvastatin (LIPITOR) 80 MG tablet TAKE 1 TABLET EVERY DAY (Patient taking differently: Take 80 mg by mouth daily. ) 90 tablet 0  . Cholecalciferol  (VITAMIN D-1000 MAX ST) 1000 units tablet Take 1,000 Units by mouth daily.    . clopidogrel (PLAVIX) 75 MG tablet Take 1 tablet (75 mg total) by mouth daily. 90 tablet 4  . cyanocobalamin ([employer] VITAMIN B12) 1000 MCG tablet Take 1,000 mcg by mouth daily.     . fenofibrate 160 MG tablet Take 160 mg by mouth daily.    . furosemide (LASIX) 20 MG tablet Take 20 mg by mouth daily.     Marland Kitchen glipiZIDE (GLUCOTROL) 10 MG tablet Take 10 mg by mouth daily.     Derrill Memo ON 06/29/2019] hydrochlorothiazide (HYDRODIURIL) 25 MG tablet Take 1 tablet (25 mg total) by mouth daily.    Marland Kitchen losartan (COZAAR) 100 MG tablet Take 100 mg by mouth daily.    . metFORMIN (GLUCOPHAGE) 500 MG tablet Take 1,000 mg by mouth 2 (two) times daily with a meal.    . mirabegron ER (MYRBETRIQ) 50 MG TB24 tablet Take 50 mg by mouth daily.    . Omega-3 Fatty Acids (FISH OIL) 1000 MG CAPS Take 1,000 mg by mouth daily with breakfast.    . polyvinyl alcohol (LIQUIFILM TEARS) 1.4 % ophthalmic solution Place 1 drop into both eyes 4 (four) times daily as needed for dry eyes.    Marland Kitchen PRESCRIPTION MEDICATION Inject 3-13 Units into the skin daily. Takes 13 units in the morning and 3 units at night. Patient not sure about the name of insulin.    Marland Kitchen sertraline (ZOLOFT) 25 MG tablet Take 1 tablet (25 mg total) by mouth daily. 90 tablet 1    Drug Regimen Review  Drug regimen was reviewed and remains appropriate with no significant issues identified  Home:     Functional History:    Functional Status:  Mobility:          ADL:    Cognition: Cognition Orientation Level: Oriented to person, Oriented to place, Oriented to situation     Blood pressure 133/74, pulse 73, temperature 98.3 F (36.8 C), temperature source Oral, resp. rate 16, SpO2 98 %. Physical Exam  Nursing note and vitals reviewed. Constitutional: He appears well-developed and well-nourished.  Flat affect but more animated with some spontaneity today. Keratosis right chin.    Elderly male sitting up in bed; wife at side; little spontaneous speech but answered questions, overall appropriate, NAD  HENT:  Head: Normocephalic and atraumatic.  Nose: Nose normal.  Mouth/Throat: Oropharynx is clear and moist. No oropharyngeal exudate.  Normal facial sensation Mild facial asymmetry tongue midline   Eyes: Conjunctivae are normal. Right eye exhibits no discharge. Left eye exhibits no discharge.  EOMI B/L R sided nystagmus  Neck: No tracheal deviation present.  Cardiovascular: Normal rate.  RRR  Respiratory: Effort normal and breath sounds normal. No stridor. No respiratory distress. He has no wheezes.  GI: He exhibits no distension.  Soft, NT, ND< (+)BS  Genitourinary:    Genitourinary Comments: Wearing condom catheter   Musculoskeletal:        General: No edema.     Cervical back: Normal range of motion and neck supple.     Comments: Really strong considering multiple strokes RUE 5-/5 in biceps, triceps, WE, grip and finger abd LUE 4+/4 in same muscles RLE- 5-/5 in HF, KE, KF, DF and PF LLE- 4-/5 in HF, KE, KF and 4/5 in DF and PF  Neurological: He is alert.  Oriented to self and place as "hospital"--needed cues to look at badge for name and thought that he was here due to falls. Delayed processing with slow speech. Able to simple motor commands with minimal cues. Mild right sided weakness.  Decreased sensation from knees and below- stocking distribution from neuropathy Otherwise, intact sensation B/L UEs and thighs Has poor initiation- only when asked to do something, otherwise, no speech/movement  Skin:  Did not see any skin breakdown on backside  Psychiatric:  Flat; poor initiation    Results for orders placed or performed during the hospital encounter of 06/28/19 (from the past 48 hour(s))  Glucose, capillary     Status: Abnormal   Collection Time: 06/28/19  5:54 PM  Result Value Ref Range   Glucose-Capillary 194 (H) 70 - 99 mg/dL    Comment:  Glucose reference range applies only to samples taken after fasting for at least 8 hours.   No results found.     Medical Problem List and Plan: 1.  Impaired function, initiation ADLs and mobility secondary to R MCA 2 small infarcts with Mild L>R weakness and poor initiation   -patient may  shower  -ELOS/Goals: 10-14 days- mod I to supervision 2.  Antithrombotics: -DVT/anticoagulation:  Pharmaceutical: Lovenox added 5/06  -antiplatelet therapy: DAPT 3. Pain Management: Tylenol prn.  4. Mood: LCSW to follow for evaluation and support.   -antipsychotic agents: N/A 5. Neuropsych: This patient is not fully capable of making decisions on his own behalf. 6. Skin/Wound Care: Routine pressure relief measures.  7. Fluids/Electrolytes/Nutrition: Monitor I/O. Check lytes in am.  8. R-MCA infarct: DAPT x 3 months followed by Plavix alone--cardiac event monitor prior to discharge. Will contact cardiology once d/c date set.  9. T2DM: Hgb A1c- 7.2.   Was on Oxempic, metformin and glipizide have been on hold. Monitor BS ac/hs and use SSI for elevated BS. Resume low dose metformin at 500 mg bid and slowly resume home meds as BS controlled on diet alone.  10. HTN: Monitor BP tid--continue Lasix, Amlodipine, HCTZ and Cozaar.  Follow up BMET in am.  11. H/o depression:Celexa bid was changed to Zoloft.  May need agent for activation (small vessel disease and minimal activity at baseline)- suggest and discussed with wife about using Amantadine for initiation- however will defer to primary CIR team.  12. Dyslipidemia: On Lipitor.     Ivan Anchors Love, PA-C 06/28/2019   I have personally performed a face to face diagnostic evaluation of this patient and formulated the key components of the plan.  Additionally, I have personally reviewed laboratory data, imaging studies, as well as relevant notes and concur with the physician assistant's documentation above.   The patient's status has not changed from the  original H&P.  Any changes in documentation from the acute care chart have been noted above.     Courtney Heys, MD 06/28/2019

## 2019-06-28 NOTE — Discharge Summary (Signed)
Name: Justin [name] MRN: [id number] DOB: 02-10-1941 79 y.o. PCP: Chesley Noon, MD  Date of Admission: 06/24/2019  9:56 AM Date of Discharge: 06/28/19 Attending Physician: Sid Falcon, MD  Discharge Diagnosis: 1. Acute Right MCA Infarct.  Discharge Medications: Allergies as of 06/28/2019   No Known Allergies     Medication List    STOP taking these medications   citalopram 10 MG tablet Commonly known as: CELEXA     TAKE these medications   amLODipine 5 MG tablet Commonly known as: NORVASC Take 1 tablet (5 mg total) by mouth daily. Start taking on: Jun 29, 2019   aspirin 81 MG EC tablet Take 1 tablet (81 mg total) by mouth daily. Start taking on: Jun 29, 2019   atorvastatin 80 MG tablet Commonly known as: LIPITOR TAKE 1 TABLET EVERY DAY   clopidogrel 75 MG tablet Commonly known as: PLAVIX Take 1 tablet (75 mg total) by mouth daily.   [employer] VITAMIN B12 1000 MCG tablet Generic drug: cyanocobalamin Take 1,000 mcg by mouth daily.   fenofibrate 160 MG tablet Take 160 mg by mouth daily.   Fish Oil 1000 MG Caps Take 1,000 mg by mouth daily with breakfast.   furosemide 20 MG tablet Commonly known as: LASIX Take 20 mg by mouth daily.   glipiZIDE 10 MG tablet Commonly known as: GLUCOTROL Take 10 mg by mouth daily.   hydrochlorothiazide 25 MG tablet Commonly known as: HYDRODIURIL Take 1 tablet (25 mg total) by mouth daily. Start taking on: Jun 29, 2019   losartan 100 MG tablet Commonly known as: COZAAR Take 100 mg by mouth daily.   metFORMIN 500 MG tablet Commonly known as: GLUCOPHAGE Take 1,000 mg by mouth 2 (two) times daily with a meal.   mirabegron ER 50 MG Tb24 tablet Commonly known as: MYRBETRIQ Take 50 mg by mouth daily.   Ozempic (0.25 or 0.5 MG/DOSE) 2 MG/1.5ML Sopn Generic drug: Semaglutide(0.25 or 0.5MG /DOS) Inject 0.25 mg into the skin once a week.   polyvinyl alcohol 1.4 % ophthalmic solution Commonly known as: LIQUIFILM TEARS Place  1 drop into both eyes 4 (four) times daily as needed for dry eyes.   PRESCRIPTION MEDICATION Inject 3-13 Units into the skin daily. Takes 13 units in the morning and 3 units at night. Patient not sure about the name of insulin.   sertraline 25 MG tablet Commonly known as: Zoloft Take 1 tablet (25 mg total) by mouth daily.   Vitamin D-1000 Max St 25 MCG (1000 UT) tablet Generic drug: Cholecalciferol Take 1,000 Units by mouth daily.     Disposition and follow-up:   Mr.Justin [name] was discharged from [facility] in Stable condition.  At the hospital follow up visit please address:  1.  CVA. Please call cardiology for event monitor placement prior to discharge from CIR. Patient will continue DAPT (ASA/Plavix) for 3 months then Plavix alone thereafter.  2.  Labs / imaging needed at time of follow-up: None  3.  Pending labs/ test needing follow-up: None  Follow-up Appointments: Follow-up Information    Frann Rider, NP. Go on 08/08/2019.   Specialty: Neurology Contact information: [id number] [place] [address] [phone]         Hospital Course by problem list:  Acute Right MCA Infarct. 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 Work-up was illustrated the CVAs were most likely secondary to small vessel disease, as MRI is notable for severe chronic ischemic microangiography. However, cardioembolic is on the differential. Neurology requested and event monitor to look for atrial fibrillation. Cardiology requested that CIR call a couple days prior to discharge. Patient will continue DAPT (ASA/Plavix) for 3 months then Plavix alone thereafter. No other medication changes were made.     Discharge Vitals:   BP (!) 152/62 (BP Location: Right Arm)   Pulse 62   Temp 98.1 F (36.7 C) (Oral)   Resp 16   SpO2 97%   Pertinent Labs, Studies, and Procedures:  CT Head 06/24/19 Atrophy with small vessel chronic ischemic changes of deep cerebral white matter.  Old BILATERAL basal ganglia and RIGHT thalamic lacunar infarcts as well as old LEFT frontal periventricular white matter infarct.  No acute intracranial abnormalities.  MR Brain 06/24/19 1. There are 2 small acute infarcts within the right MCA territory, at the centrum semiovale and posterior corona radiata. No acute hemorrhage or mass effect. 2. Findings of advanced chronic ischemic microangiopathy and widespread atrophy greater than expected for age.  CTA Head 06/24/19 1. No intracranial arterial occlusion or high-grade stenosis. 2. Multifocal atherosclerotic irregularity of the intracranial arteries.  TTE 06/25/19 1. Left ventricular ejection fraction, by estimation, is 70 to 75%. The  left ventricle has hyperdynamic function. The left ventricle has no  regional wall motion abnormalities. Left ventricular diastolic parameters  are consistent with Grade I diastolic  dysfunction (impaired relaxation).  2. Right ventricular systolic function is normal. The right ventricular  size is normal.  3. The mitral valve is normal in structure. No evidence of mitral valve  regurgitation. No evidence of mitral stenosis.  4. The aortic valve is normal in structure. Aortic valve regurgitation is  not visualized. No aortic stenosis is present.  5. The inferior vena cava is normal in size with greater than 50%  respiratory variability, suggesting right atrial pressure of 3 mmHg.   Conclusion(s)/Recommendation(s): No intracardiac source of embolism  detected on this transthoracic study. A transesophageal echocardiogram is  recommended to exclude cardiac source of embolism if clinically indicated.   Discharge  Instructions: Discharge Instructions    Diet - low sodium heart healthy   Complete by: As directed    Increase activity slowly   Complete by: As directed     Signed: [name], MD 06/28/2019, 11:01 AM   Pager: [phone]

## 2019-06-28 NOTE — Progress Notes (Signed)
  Date: 06/28/2019  Patient name: Justin [name]  Medical record number: [id number]  Date of birth: 03-15-1940        I have seen and evaluated this patient and I have discussed the plan of care with the house staff. Please see Dr. Jerrell Mylar note for complete details. I concur with his findings and plan for transition to CIR today.  Patient will need event monitor either while at CIR or at discharge.   Sid Falcon, MD 06/28/2019, 12:45 PM

## 2019-06-28 NOTE — Progress Notes (Signed)
Cristina Gong, RN  Rehab Admission Coordinator  Physical Medicine and Rehabilitation  PMR Pre-admission      Signed  Date of Service:  06/26/2019  3:26 PM      Related encounter: ED to Hosp-Admission (Current) from 06/24/2019 in [facility]      Signed        Show:Clear all [x] Manual[x] Template[x] Copied  Added by: [x] Cristina Gong, RN  [] Hover for details PMR Admission Coordinator Pre-Admission Assessment   Patient: Justin [name] is an 79 y.o., male MRN: [id number] DOB: 14-Apr-1940 Height:   Weight:                                                                                                                                                    Insurance Information   PRIMARY: [employer]    Policy#: [id number]      Subscriber: pt CM Name: Elmo Putt      Phone#: [phone] ext [phone]     Fax#: [phone] Pre-Cert#: [id number] approved for 7 days f/u with Jeanette Caprice at ext [phone] same fax in 7 days      Employer:  Benefits:  Phone #: [phone]     Name: 5/4 Eff. Date: 02/23/2019     Deduct: none      Out of Pocket Max: $3900      Life Max: none  CIR: $295 co pay per day days 1 until 6      SNF: no copay days 1 until 20; $184 co pay per day days 21 until 100 Outpatient: $10 to $40 per visit     Co-Pay: visits limited per medical neccesity Home Health: 1005      Co-Pay: visits limited per medical neccesity DME: 80%     Co-Pay: 20%   Providers: in network  [facility]   Financial Counselor:       Phone#:    The Engineer, [employer]" for patients in Inpatient Rehabilitation Facilities with attached "Privacy Act [place] Records" was provided and verbally reviewed with: Patient and Family   Emergency Contact Information         Contact Information     Name Relation Home Work Mobile    Devario, Tison [phone]   [phone]       Current Medical History  Patient Admitting Diagnosis:   CVA   History of Present Illness: 79 y.o. 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 He was admitted on 06/24/19 with multiple falls in the past week requiring walker, speech difficulty and weakness.  MRI brain done revealing 2 small acute infracts in R-MCA territory and advanced chronic ischemic changes with widespread atrophy advanced for age.  CTA head neck was negative for high grade occlusion or  stenosis but multifocal areas of atherosclerotic irregularity. Carotid dopplers was negative for ICA stenosis. BLE dopplers negative for DVT. 2 D echo showed EF 70-75%, hyperdynamic function with grade I diastolic dysfunction and no shunt. Dr. Erlinda Hong felt that stroke was due to small vessel disease but recommended 30 day event monitor to rule out A fib. Therapy evaluations completed yesterday revealing balance deficits with right lateral lean, impaired cognition with decreased activity tolerance.    Complete NIHSS TOTAL: 0 Glasgow Coma Scale Score: 15   Past Medical History      Past Medical History:  Diagnosis Date  . Brain bleed ([place])    . Cancer [facility])      prostate  . CHI (closed head injury) 04/2013    with SAH  . Depression    . Diabetes mellitus without complication ([place])    . High cholesterol    . Hypertension    . Stroke [facility])        Family History  family history includes CAD in his mother; Diabetes in his brother, brother, mother, sister, and sister; Emphysema in his father.   Prior Rehab/Hospitalizations:  Has the patient had prior rehab or hospitalizations prior to admission? Yes   Has the patient had major surgery during 100 days prior to admission? No   Current Medications    Current Facility-Administered Medications:  .  acetaminophen (TYLENOL) tablet 650 mg, 650 mg, Oral, Q4H PRN **OR** acetaminophen (TYLENOL) 160 MG/5ML solution 650 mg, 650 mg, Per Tube, Q4H  PRN **OR** acetaminophen (TYLENOL) suppository 650 mg, 650 mg, Rectal, Q4H PRN, Masoudi, Elhamalsadat, MD .  amLODipine (NORVASC) tablet 5 mg, 5 mg, Oral, Daily, Jose Persia, MD, 5 mg at 06/28/19 1019 .  aspirin EC tablet 81 mg, 81 mg, Oral, Daily, Helberg, Justin, MD, 81 mg at 06/28/19 1019 .  atorvastatin (LIPITOR) tablet 80 mg, 80 mg, Oral, Daily, Masoudi, Elhamalsadat, MD, 80 mg at 06/28/19 1019 .  clopidogrel (PLAVIX) tablet 75 mg, 75 mg, Oral, Daily, Masoudi, Elhamalsadat, MD, 75 mg at 06/28/19 1019 .  furosemide (LASIX) tablet 20 mg, 20 mg, Oral, Daily, Masoudi, Elhamalsadat, MD, 20 mg at 06/28/19 1019 .  hydrochlorothiazide (HYDRODIURIL) tablet 25 mg, 25 mg, Oral, Daily, Jose Persia, MD, 25 mg at 06/28/19 1019 .  insulin aspart (novoLOG) injection 0-9 Units, 0-9 Units, Subcutaneous, TID WC, Masoudi, Elhamalsadat, MD, 2 Units at 06/27/19 1742 .  losartan (COZAAR) tablet 100 mg, 100 mg, Oral, Daily, Jose Persia, MD, 100 mg at 06/28/19 1019 .  senna-docusate (Senokot-S) tablet 1 tablet, 1 tablet, Oral, QHS PRN, Masoudi, Elhamalsadat, MD .  sertraline (ZOLOFT) tablet 25 mg, 25 mg, Oral, Daily, Sherry Ruffing, Marissa M, MD, 25 mg at 06/28/19 1019   Patients Current Diet:     Diet Order                      Diet - low sodium heart healthy           Diet heart healthy/carb modified Room service appropriate? Yes with Assist; Fluid consistency: Thin  Diet effective now                   Precautions / Restrictions Precautions Precautions: Fall Precaution Comments: posterior  and R lateral lean and h/o falls Restrictions Weight Bearing Restrictions: No    Has the patient had 2 or more falls or a fall with injury in the past year?Yes over past 3 to 4 months   Prior Activity Level Limited  Community (1-2x/wk): Independent; not driving; more sedentary over past 3 to 4 monthsJust began using RW more since falling . Has had difficulty getting out of chair for a few days. Wife assist  with adls and medication management. Patient with gradual decline in function over past 3 to 4 months per wife. More sedentary and non talkative. Wears depends she feels so that he will not have to ambulate distance to bathroom. She has discussed with his PCP.   Prior Functional Level Prior Function Level of Independence: Needs assistance Gait / Transfers Assistance Needed: Pts wife reports he just started using RW more since falling. Has had difficulty getting up out of a chair the past few days.  ADL's / Homemaking Assistance Needed: Pt reports he is able to perform ADLs mod I.  wife assists with IADLs and with medication management    Self Care: Did the patient need help bathing, dressing, using the toilet or eating?  Needed some help   Indoor Mobility: Did the patient need assistance with walking from room to room (with or without device)? Independent   Stairs: Did the patient need assistance with internal or external stairs (with or without device)? Independent   Functional Cognition: Did the patient need help planning regular tasks such as shopping or remembering to take medications? Needed some help   Home Assistive Devices / Equipment Home Equipment: Hand held shower head, Shower seat, Cane - quad, Environmental consultant - 2 wheels   Prior Device Use: Indicate devices/aids used by the patient prior to current illness, exacerbation or injury? Walker   Current Functional Level Cognition   Arousal/Alertness: Awake/alert Overall Cognitive Status: Impaired/Different from baseline Orientation Level: Oriented X4 Following Commands: Follows one step commands with increased time General Comments: slow to respond to therapist but appropriate responses and oriented Attention: Focused, Sustained Focused Attention: Impaired Focused Attention Impairment: Functional basic, Verbal basic, Verbal complex, Functional complex Sustained Attention: Impaired Sustained Attention Impairment: Functional basic,  Functional complex, Verbal complex, Verbal basic Memory: Impaired Memory Impairment: Storage deficit, Retrieval deficit, Decreased recall of new information, Decreased short term memory Decreased Short Term Memory: Verbal basic, Verbal complex, Functional basic, Functional complex Awareness: Impaired Awareness Impairment: Intellectual impairment Problem Solving: Impaired Problem Solving Impairment: Verbal basic, Verbal complex, Functional basic, Functional complex Executive Function: Reasoning Reasoning: Impaired Safety/Judgment: Impaired [employer] Scales of Cognitive Functioning: Purposeful/appropriate    Extremity Assessment (includes Sensation/Coordination)   Upper Extremity Assessment: Defer to OT evaluation  Lower Extremity Assessment: Generalized weakness     ADLs   Overall ADL's : Needs assistance/impaired Eating/Feeding: Set up, Bed level Grooming: Wash/dry hands, Wash/dry face, Oral care, Brushing hair, Minimal assistance, Bed level Upper Body Bathing: Sitting, Moderate assistance Lower Body Bathing: Maximal assistance, Bed level Upper Body Dressing : Maximal assistance, Sitting Lower Body Dressing: Total assistance, Bed level Toilet Transfer: Total assistance Toilet Transfer Details (indicate cue type and reason): unable to safely perform  Toileting- Clothing Manipulation and Hygiene: Total assistance, Bed level Toileting - Clothing Manipulation Details (indicate cue type and reason): per RN, pt was in diaper and saturated with urine this am, and required total A for peri care and clean up  Functional mobility during ADLs: Maximal assistance, Moderate assistance     Mobility   Overal bed mobility: Needs Assistance Bed Mobility: Supine to Sit Supine to sit: Min assist Sit to supine: Mod assist General bed mobility comments: assist to scoot R hip to EOB; use of rail      Transfers  Overall transfer level: Needs assistance Equipment used: Rolling walker (2  wheeled) Transfers: Sit to/from Stand Sit to Stand: Min assist General transfer comment: cues for safe hand placement; assist to power up and to steady      Ambulation / Gait / Stairs / Wheelchair Mobility   Ambulation/Gait Ambulation/Gait assistance: Min assist, +2 safety/equipment(chair follow ) Gait Distance (Feet): 70 Feet Assistive device: Rolling walker (2 wheeled) Gait Pattern/deviations: Step-to pattern, Step-through pattern, Decreased step length - right, Shuffle, Decreased dorsiflexion - right General Gait Details: assist for balance and weight shifting as pt has R lateral bias; cues for upright posture and increased bilat step lengths; increased difficulty with motor planning and advancing R LE with increased gait distance  Gait velocity: decreased     Posture / Balance Dynamic Sitting Balance Sitting balance - Comments: Tending to lean R - could correct with verbal and visual cues; wife reports normally leans R; could maintain with SBA Balance Overall balance assessment: Needs assistance Sitting-balance support: Feet supported Sitting balance-Leahy Scale: Fair Sitting balance - Comments: Tending to lean R - could correct with verbal and visual cues; wife reports normally leans R; could maintain with SBA Postural control: Posterior lean, Right lateral lean Standing balance support: Bilateral upper extremity supported Standing balance-Leahy Scale: Poor Standing balance comment: initially with posterior lean and requiring mod A; given visual and tactile cues and pt able to progress to min guard without posterior lean     Special needs/care consideration  Diabetic management with Hgb A1c 7.2 Designated visitors are [place] Arrangements: Spouse/significant other  Lives With: Spouse Available Help at Discharge: Family, Available 24 hours/day Type of Home: House Home Layout: One level Home Access: Stairs to enter Entrance Stairs-Rails:  Right Entrance Stairs-Number of Steps: 3 Bathroom Shower/Tub: Multimedia programmer: Handicapped height Bathroom Accessibility: Yes Home Care Services: No Additional Comments: wife can provide 24/7 supervision   Discharge Living Setting Plans for Discharge Living Setting: Patient's home, Lives with (comment) Type of Home at Discharge: House Discharge Home Layout: One level Discharge Home Access: Stairs to enter Entrance Stairs-Rails: Right Entrance Stairs-Number of Steps: 3 Discharge Bathroom Shower/Tub: Walk-in shower Discharge Bathroom Toilet: Handicapped height Discharge Bathroom Accessibility: Yes How Accessible: Accessible via walker Does the patient have any problems obtaining your medications?: No   Social/Family/Support Systems Patient Roles: Spouse, Parent Contact Information: wife, [name] Anticipated Caregiver: wife Anticipated Ambulance person Information: see above Ability/Limitations of Caregiver: supervision level Caregiver Availability: 24/7 Discharge Plan Discussed with Primary Caregiver: Yes Is Caregiver In Agreement with Plan?: Yes Does Caregiver/Family have Issues with Lodging/Transportation while Pt is in Rehab?: No   Goals Patient/Family Goal for Rehab: Mod I to supervision with PT and OT and SLP Expected length of stay: ELOS 7 to 10 days Pt/Family Agrees to Admission and willing to participate: Yes Program Orientation Provided & Reviewed with Pt/Caregiver Including Roles  & Responsibilities: Yes   Decrease burden of Care through IP rehab admission: n/a   Possible need for SNF placement upon discharge: not anticipated   Patient Condition: This patient's condition remains as documented in the consult dated 06/26/2019, in which the Rehabilitation Physician determined and documented that the patient's condition is appropriate for intensive rehabilitative care in an inpatient rehabilitation facility. Will admit to inpatient rehab today.    Preadmission Screen Completed By:  Cleatrice Burke, RN, 06/28/2019 11:27 AM ______________________________________________________________________   Discussed status with Dr. Dagoberto Ligas on 06/28/2019 at  1128 and received  approval for admission today.   Admission Coordinator:  Cleatrice Burke, time [id number] Date 06/28/2019             Cosigned by: Courtney Heys, MD at 06/28/2019 11:44 AM  Revision History

## 2019-06-28 NOTE — Progress Notes (Signed)
Inpatient Rehabilitation Admissions Coordinator  I have CIR bed available to admit pt to today. I met with patient at bedside and spoke with his wife by phone. They are in agreement to admit. I have notified Dr. Doristine Section team as well as [facility] team. I will make the arrangements to admit today.  Danne Baxter, RN, MSN Rehab Admissions Coordinator [phone] 06/28/2019 11:04 AM

## 2019-06-28 NOTE — Progress Notes (Signed)
   Subjective: Patient is doing well this morning. He is tolerating PO intake. He is ready to go to rehab today. We discussed changes to his medication and the need for an event monitor. He voices understanding. No questions or concerns.  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   Acute Right MCA Infarct  - Most likely secondary to small vessel disease, as MRI is notable for severe chronic ischemic microangiography. However, cardioembolic is on the differential. Neurology requesting and event monitor. Cardiology requesting that CIR call a couple days prior to discharge  - Aspirin 81 mg daily - Plavix 75 mg daily - DAPT for 3 months then Plavix alone per Neurology  - Discharge to CIR today   Type 2 Diabetes Mellitus - Last A1c 7.2  - Home regimen includes Linagliptin, Metformin and Glipizide. Would continue on discharge  - While inpatient continue SSI  Hyperlipidemia - Continue Atorvastatin 80 mg daily   Hypertension - Amlodipine 5 mg daily - HCTZ 25 mg daily - Losartan 100 mg daily  AKI. Resolved  Prior to Admission Living Arrangement: Home Anticipated Discharge Location: CIR Barriers to Discharge: None Dispo: [facility]charge to [facility]  [name],  MD 06/28/2019, 6:29 AM Pager: [phone]

## 2019-06-29 ENCOUNTER — Inpatient Hospital Stay (HOSPITAL_COMMUNITY): Payer: Medicare HMO | Admitting: Occupational Therapy

## 2019-06-29 ENCOUNTER — Inpatient Hospital Stay (HOSPITAL_COMMUNITY): Payer: Medicare HMO

## 2019-06-29 ENCOUNTER — Inpatient Hospital Stay (HOSPITAL_COMMUNITY): Payer: Medicare HMO | Admitting: Physical Therapy

## 2019-06-29 DIAGNOSIS — I63511 Cerebral infarction due to unspecified occlusion or stenosis of right middle cerebral artery: Secondary | ICD-10-CM | POA: Diagnosis not present

## 2019-06-29 LAB — CBC WITH DIFFERENTIAL/PLATELET
Abs Immature Granulocytes: 0.05 10*3/uL (ref 0.00–0.07)
Basophils Absolute: 0.1 10*3/uL (ref 0.0–0.1)
Basophils Relative: 1 %
Eosinophils Absolute: 0.6 10*3/uL — ABNORMAL HIGH (ref 0.0–0.5)
Eosinophils Relative: 6 %
HCT: 46.6 % (ref 39.0–52.0)
Hemoglobin: 15.5 g/dL (ref 13.0–17.0)
Immature Granulocytes: 1 %
Lymphocytes Relative: 27 %
Lymphs Abs: 2.5 10*3/uL (ref 0.7–4.0)
MCH: 27.5 pg (ref 26.0–34.0)
MCHC: 33.3 g/dL (ref 30.0–36.0)
MCV: 82.8 fL (ref 80.0–100.0)
Monocytes Absolute: 0.8 10*3/uL (ref 0.1–1.0)
Monocytes Relative: 8 %
Neutro Abs: 5.4 10*3/uL (ref 1.7–7.7)
Neutrophils Relative %: 57 %
Platelets: 295 10*3/uL (ref 150–400)
RBC: 5.63 MIL/uL (ref 4.22–5.81)
RDW: 12.7 % (ref 11.5–15.5)
WBC: 9.3 10*3/uL (ref 4.0–10.5)
nRBC: 0 % (ref 0.0–0.2)

## 2019-06-29 LAB — COMPREHENSIVE METABOLIC PANEL
ALT: 21 U/L (ref 0–44)
AST: 20 U/L (ref 15–41)
Albumin: 3.4 g/dL — ABNORMAL LOW (ref 3.5–5.0)
Alkaline Phosphatase: 31 U/L — ABNORMAL LOW (ref 38–126)
Anion gap: 10 (ref 5–15)
BUN: 25 mg/dL — ABNORMAL HIGH (ref 8–23)
CO2: 25 mmol/L (ref 22–32)
Calcium: 8.9 mg/dL (ref 8.9–10.3)
Chloride: 101 mmol/L (ref 98–111)
Creatinine, Ser: 1.02 mg/dL (ref 0.61–1.24)
GFR calc Af Amer: >60 mL/min (ref >60)
GFR calc non Af Amer: >60 mL/min (ref >60)
Glucose, Bld: 135 mg/dL — ABNORMAL HIGH (ref 70–99)
Potassium: 3.9 mmol/L (ref 3.5–5.1)
Sodium: 136 mmol/L (ref 135–145)
Total Bilirubin: 0.8 mg/dL (ref 0.3–1.2)
Total Protein: 6.3 g/dL — ABNORMAL LOW (ref 6.5–8.1)

## 2019-06-29 LAB — GLUCOSE, CAPILLARY
Glucose-Capillary: 114 mg/dL — ABNORMAL HIGH (ref 70–99)
Glucose-Capillary: 162 mg/dL — ABNORMAL HIGH (ref 70–99)
Glucose-Capillary: 141 mg/dL — ABNORMAL HIGH (ref 70–99)
Glucose-Capillary: 203 mg/dL — ABNORMAL HIGH (ref 70–99)

## 2019-06-29 NOTE — Progress Notes (Signed)
Inpatient Rehabilitation  Patient information reviewed and entered into eRehab system by Shyler Hamill M. Jalila Goodnough, M.A., CCC/SLP, PPS Coordinator.  Information including medical coding, functional ability and quality indicators will be reviewed and updated through discharge.    

## 2019-06-29 NOTE — Progress Notes (Signed)
Social Work Assessment and Plan   Patient Details  Name: Justin [name] MRN: [id number] Date of Birth: May 08, 1940  Today's Date: 06/29/2019  Problem List:  Patient Active Problem List   Diagnosis Date Noted  . 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 Ambulatory dysfunction   . Stroke ([place]) 06/24/2019  . Type 2 diabetes mellitus ([place]) 10/04/2018  . Depression 10/04/2018  . Acute CVA (cerebrovascular accident) ([place]) 09/18/2017  . Stroke (cerebrum) ([place]) 09/17/2017  . Diabetes mellitus without complication ([place]) 40/98/1191  . Closed head injury 04/22/2013  . HLD (hyperlipidemia) 07/29/2009  . Essential hypertension 07/29/2009  . EFFUSION, PLEURAL 07/29/2009  . DIVERTICULITIS, COLON 07/29/2009  . 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 COLONIC POLYPS, HYPERPLASTIC, HX OF 07/29/2009   Past Medical History:  Past Medical History:  Diagnosis Date  . Brain bleed ([place])   . Cancer [facility])    prostate  . 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 Depression   . Diabetes mellitus without complication ([place])   . High cholesterol   . Hypertension   . Stroke [facility])    Past Surgical History:  Past Surgical History:  Procedure Laterality Date  . arm & hand surgery Left 1972   skill saw accident  . COLON SURGERY     partial  . EYE SURGERY     lens implant and cateract- Left  . PROSTATECTOMY     Social History:  reports that he has never smoked. He has never used smokeless tobacco. He reports that he does not drink alcohol or use drugs.  Family / Support Systems Patient Roles: Spouse Spouse/Significant Other: [name] Children: sw aware of 1 son, waiting to follow up with spouse Anticipated Caregiver: [name] Caregiver Availability: 24/7  Social History Preferred language: English Religion: Baptist Read: Yes Write: Yes   Abuse/Neglect Abuse/Neglect Assessment Can Be Completed: Yes Physical Abuse: Denies Verbal Abuse: Denies Sexual Abuse: Denies Exploitation of  patient/patient's resources: Denies Self-Neglect: Denies  Emotional Status    Patient / Family Perceptions, Expectations & Goals Pt/Family understanding of illness & functional limitations: yes Pt/family expectations/goals: Goal is to discharge home with family to provide 24 hr care  [employer]: None Premorbid Home Care/DME Agencies: None  Discharge Planning Living Arrangements: Spouse/significant other, Children Support Systems: Spouse/significant other, Children Type of Residence: Private residence Insurance Resources: Multimedia programmer (specify)([employer]) Money Management: Spouse, Patient Does the patient have any problems obtaining your medications?: No Social Work Anticipated Follow Up Needs: HH/OP  Clinical Impression Met patient and spouse introduced self and explained role.   Dyanne Iha 06/29/2019, 1:21 PM

## 2019-06-29 NOTE — Care Management (Signed)
[facility] Individual Statement of Services  Patient Name:  Justin [name]  Date:  06/29/2019  Welcome to the [facility].  Our goal is to provide you with an individualized program based on your diagnosis and situation, designed to meet your specific needs.  With this comprehensive rehabilitation program, you will be expected to participate in at least 3 hours of rehabilitation therapies Monday-Friday, with modified therapy programming on the weekends.  Your rehabilitation program will include the following services:  Physical Therapy (PT), Occupational Therapy (OT), Speech Therapy (ST), 24 hour per day rehabilitation nursing, Therapeutic Recreaction (TR), Psychology, Case Management (Social Worker), Rehabilitation Medicine, Nutrition Services, Pharmacy Services and Other  Weekly team conferences will be held on Wednesdays to discuss your progress.  Your Social Worker will talk with you frequently to get your input and to update you on team discussions.  Team conferences with you and your family in attendance may also be held.  Expected length of stay: 7-14 Days  Overall anticipated outcome: MOD I to Supervision  Depending on your progress and recovery, your program may change. Your Social Worker will coordinate services and will keep you informed of any changes. Your Social Worker's name and contact numbers are listed  below.  The following services may also be recommended but are not provided by the [facility]:    [facility] will be made to provide these services after discharge if needed.  Arrangements include referral to agencies that provide these services.  Your insurance has been verified to be:  [employer] Your primary doctor is:  Anastasia Pall, MD  Pertinent information will be shared with your doctor and your insurance company.  Social Worker:   Erlene Quan, [place] or [phone]   Information discussed with and copy given to patient by: Dyanne Iha, 06/29/2019, 11:20 AM

## 2019-06-29 NOTE — IPOC Note (Addendum)
Overall Plan of Care [facility]) Patient Details Name: Justin [name] MRN: [id number] DOB: [birth year]  Admitting Diagnosis: Acute right MCA stroke [name] ([place]  Hospital Problems: Principal Problem:   Acute right MCA stroke ([place]) Active Problems:   Major depressive disorder, recurrent episode, moderate (Justin [name])   Gait disorder     Functional Problem List: Nursing Bladder, Bowel, Sensory  PT Balance, Perception, Behavior, Safety, Edema, Sensory, Endurance, Skin Integrity, Motor, Nutrition, Pain  OT Balance, Cognition, Endurance, Safety, Pain  SLP    TR         Basic ADL's: OT Grooming, Bathing, Dressing, Toileting     Advanced  ADL's: OT       Transfers: PT Bed Mobility, Bed to Chair, Car, Patent attorney, Agricultural engineer: PT Ambulation, Emergency planning/management officer, Stairs     Additional Impairments: OT None  SLP Social Cognition, Communication comprehension Attention, Awareness, Memory, Problem Solving  TR      Anticipated Outcomes Item Anticipated Outcome  Self Feeding independent  Swallowing      Basic self-care  supervision  Toileting  supervision   Bathroom Transfers supervision  Bowel/Bladder  to be continent x 2, LBM 05/05  Transfers  CGA  Locomotion  CGA using LRAD  Communication  Supervision A  Cognition  Supervision A  Pain  to be less than 3 out of 10  Safety/Judgment  to remain fall free while in rehab   Therapy Plan: PT Intensity: Minimum of 1-2 x/day ,45 to 90 minutes PT Frequency: 5 out of 7 days PT Duration Estimated Length of Stay: ~2-2.5 weeks OT Intensity: Minimum of 1-2 x/day, 45 to 90 minutes OT Frequency: 5 out of 7 days OT Duration/Estimated Length of Stay: 9-11 days SLP Intensity: Minumum of 1-2 x/day, 30 to 90 minutes SLP Frequency: 1 to 3 out of 7 days SLP Duration/Estimated Length of Stay: 9-11 days   Due to the current state of emergency, patients may not be receiving their 3-hours of Medicare-mandated therapy.    Team Interventions: Nursing Interventions Patient/Family Education, Disease Management/Prevention, Discharge Planning  PT interventions Ambulation/gait training, Community reintegration, DME/adaptive equipment instruction, Neuromuscular re-education, Psychosocial support, Stair training, UE/LE Strength taining/ROM, Wheelchair propulsion/positioning, Training and development officer, Discharge planning, Functional electrical stimulation, Pain management, Skin care/wound management, UE/LE Coordination activities, Therapeutic Activities, Cognitive remediation/compensation, Disease management/prevention, Functional mobility training, Patient/family education, Splinting/orthotics, Visual/perceptual remediation/compensation, Therapeutic Exercise  OT Interventions Balance/vestibular training, Neuromuscular re-education, Self Care/advanced ADL retraining, Therapeutic Exercise, Cognitive remediation/compensation, DME/adaptive equipment instruction, Pain management, UE/LE Strength taining/ROM, Community reintegration, Barrister's clerk education, UE/LE Coordination activities, Discharge planning, Functional mobility training, Psychosocial support, Therapeutic Activities  SLP Interventions Cognitive remediation/compensation, Cueing hierarchy, Internal/external aids, Functional tasks, Patient/family education  TR Interventions    SW/CM Interventions Discharge Planning, Psychosocial Support, Patient/Family Education   Barriers to Discharge MD  Medical stability  Nursing      PT Inaccessible home environment, Home environment access/layout    OT      SLP      SW       Team Discharge Planning: Destination: PT-Home ,OT- Home , SLP-Home Projected Follow-up: PT-Home health PT, 24 hour supervision/assistance, OT-  Home health OT, SLP-None Projected Equipment Needs: PT-To be determined, OT- To be determined, SLP-None recommended by SLP Equipment Details: PT- , OT-  Patient/family involved in discharge planning: PT-  Patient,  OT-Patient, SLP-Patient  MD ELOS: 10-14d Medical Rehab Prognosis:  Good Assessment:  79 year old male with history of HTN, T2DM, C HI-04/2013, lumbar radiculopathy with gait disorder/falls, HTN,  multiple prior CVA--last 09/2018 with minimal residual effects.  He was admitted on 06/24/2019 with multiple falls in the week prior to admission, speech difficulty and weakness.  MRI brain done revealing 2 small acute infarcts in right MCA territory and advanced chronic ischemic changes with widespread atrophy advanced for age.  CTA head/neck was negative for high-grade occlusion or stenosis but multifocal areas of atherosclerotic irregularity noted.  Carotid Dopplers were negative ICA stenosis.  BLE Dopplers negative for DVT.  2D echo showed EF of 70 to 75%, hyperdynamic function with grade 1 diastolic dysfunction and no shunt.  Dr. Erlinda Hong felt the stroke was due to small vessel disease and recommended DAPT x3 months followed by Plavix alone as well as 30-day event monitor to rule out A. fib given 2 separate stroke locations.--Cardiology to be contacted for event monitor prior to discharge..  Patient with resultant balance deficits, right lateral lean, impaired cognition with decrease in activity tolerance.  CIR was recommended due to functional decline   See Team Conference Notes for weekly updates to the plan of care   Now requiring 24/7 Rehab RN,MD, as well as CIR level PT, OT and SLP.  Treatment team will focus on ADLs and mobility with goals set at Sup

## 2019-06-29 NOTE — Evaluation (Signed)
Occupational Therapy Assessment and Plan  Patient Details  Name: Justin [name] MRN: [id number] Date of Birth: 07/11/1940  OT Diagnosis: abnormal posture, acute pain, cognitive deficits and muscle weakness (generalized) Rehab Potential: Rehab Potential (ACUTE ONLY): Excellent ELOS: 9-11 days   Today's Date: 06/29/2019 OT Individual Time: 0800-0903 OT Individual Time Calculation (min): 63 min     Problem List:  Patient Active Problem List   Diagnosis Date Noted  . Acute right MCA stroke ([facility]) 06/28/2019  . Ambulatory dysfunction   . Stroke ([facility]) 06/24/2019  . Type 2 diabetes mellitus ([facility]) 10/04/2018  . Depression 10/04/2018  . Acute CVA (cerebrovascular accident) ([facility]) 09/18/2017  . Stroke (cerebrum) ([facility]) 09/17/2017  . Diabetes mellitus without complication ([facility]) 09/17/2017  . Closed head injury 04/22/2013  . HLD (hyperlipidemia) 07/29/2009  . Essential hypertension 07/29/2009  . EFFUSION, PLEURAL 07/29/2009  . DIVERTICULITIS, COLON 07/29/2009  . 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 COLONIC POLYPS, HYPERPLASTIC, HX OF 07/29/2009    Past Medical History:  Past Medical History:  Diagnosis Date  . Brain bleed ([facility])   . Cancer ([facility])    prostate  . 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 Depression   . Diabetes mellitus without complication ([facility])   . High cholesterol   . Hypertension   . Stroke ([facility])    Past Surgical History:  Past Surgical History:  Procedure Laterality Date  . arm & hand surgery Left 1972   skill saw accident  . COLON SURGERY     partial  . EYE SURGERY     lens implant and cateract- Left  . PROSTATECTOMY      Assessment & Plan Clinical Impression: Patient is a 79 y.o. year old male with recent admission to the hospital on 06/24/2019 with multiple falls in the week prior to admission, speech difficulty and weakness.  MRI brain done revealing 2 small acute infarcts in right MCA territory and advanced chronic ischemic changes with widespread  atrophy advanced for age.  CTA head/neck was negative for high-grade occlusion or stenosis but multifocal areas of atherosclerotic irregularity noted.   .  Patient transferred to CIR on 06/28/2019 .    Patient currently requires min with basic self-care skills secondary to muscle weakness, decreased memory and decreased standing balance, decreased postural control and decreased balance strategies.  Prior to hospitalization, patient could complete ADLs with modified independent .  Patient will benefit from skilled intervention to decrease level of assist with basic self-care skills and increase independence with basic self-care skills prior to discharge home with care partner.  Anticipate patient will require 24 hour supervision and follow up home health.  OT - End of Session Activity Tolerance: Improving Endurance Deficit: Yes OT Assessment Rehab Potential (ACUTE ONLY): Excellent OT Patient demonstrates impairments in the following area(s): Balance;Cognition;Endurance;Safety;Pain OT Basic ADL's Functional Problem(s): Grooming;Bathing;Dressing;Toileting OT Transfers Functional Problem(s): Tub/Shower;Toilet OT Additional Impairment(s): None OT Plan OT Intensity: Minimum of 1-2 x/day, 45 to 90 minutes OT Frequency: 5 out of 7 days OT Duration/Estimated Length of Stay: 9-11 days OT Treatment/Interventions: Balance/vestibular training;Neuromuscular re-education;Self Care/advanced ADL retraining;Therapeutic Exercise;Cognitive remediation/compensation;DME/adaptive equipment instruction;Pain management;UE/LE Strength taining/ROM;Community reintegration;Patient/family education;UE/LE Coordination activities;Discharge planning;Functional mobility training;Psychosocial support;Therapeutic Activities OT Self Feeding Anticipated Outcome(s): independent OT Basic Self-Care Anticipated Outcome(s): supervision OT Toileting Anticipated Outcome(s): supervision OT Bathroom Transfers Anticipated Outcome(s):  supervision OT Recommendation Patient destination: Home Follow Up Recommendations: Home health OT Equipment Recommended: To be determined   Skilled Therapeutic Intervention Pt worked on selfcare retraining, sit to stand   from the EOB during session.  Mod assist for transfer from supine to sit with supervision for sitting balance during selfcare tasks.  He was able to complete sit to stand throughout session with min assist and mod instructional cueing for hand placement to push off of the bed and not pull on the walker.  Slight difficulty noted when reaching his feet for washing and for donning gripper socks, but he was able to donn the left and then therapist assisted with the right.  With standing he demonstrates increased trunk flexion and the longer he stands the more flexion persists secondary to weakness.  Short step length noted with decreased weightshifts during mobility.  Pt left in the wheelchair at end of session with call button and phone in reach. Safety belt in place as well.   OT Evaluation Precautions/Restrictions  Precautions Precautions: Fall Restrictions Weight Bearing Restrictions: No   Pain Pain Assessment Pain Scale: 0-10 Pain Score: 1  Pain Type: Chronic pain Pain Location: Back Pain Orientation: Lower Pain Descriptors / Indicators: Discomfort Pain Onset: With Activity Pain Intervention(s): Repositioned Home Living/Prior Functioning Home Living Available Help at Discharge: Family, Available 24 hours/day Type of Home: House Home Access: Stairs to enter Entrance Stairs-Number of Steps: 3 Entrance Stairs-Rails: Right Home Layout: One level Bathroom Shower/Tub: Walk-in shower(shower seat) Bathroom Toilet: Handicapped height Bathroom Accessibility: Yes Additional Comments: wife can provide 24/7 supervision  Lives With: Spouse IADL History Homemaking Responsibilities: No Current License: No Occupation: Retired 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  [employer] Prior Function Level of Independence: Independent with basic ADLs, Requires assistive device for independence(used cane for mobility) Driving: No Vocation: Retired ADL ADL Eating: Independent Where Assessed-Eating: Wheelchair Grooming: Minimal assistance Where Assessed-Grooming: Standing at sink Upper Body Bathing: Supervision/safety Where Assessed-Upper Body Bathing: Edge of bed Lower Body Bathing: Minimal assistance Where Assessed-Lower Body Bathing: Edge of bed Upper Body Dressing: Supervision/safety Where Assessed-Upper Body Dressing: Edge of bed Lower Body Dressing: Minimal assistance Where Assessed-Lower Body Dressing: Edge of bed Toileting: Minimal assistance Where Assessed-Toileting: Bedside Commode Toilet Transfer: Minimal assistance Toilet Transfer Method: Ambulating Toilet Transfer Equipment: Raised toilet seat, Grab bars Vision Baseline Vision/History: No visual deficits Patient Visual Report: Blurring of vision(left eye) Vision Assessment?: Yes Alignment/Gaze Preference: Within Defined Limits Tracking/Visual Pursuits: Able to track stimulus in all quads without difficulty Saccades: Within functional limits Convergence: Within functional limits Visual Fields: No apparent deficits Perception  Perception: Within Functional Limits Praxis Praxis: Intact Cognition Overall Cognitive Status: Impaired/Different from baseline Arousal/Alertness: Awake/alert Orientation Level: Person;Situation(could [facility] in [place] but not the name) Person: Oriented Place: Disoriented Situation: Oriented Year: 2021 Month: May Day of Week: Correct Memory: Impaired Memory Impairment: Storage deficit;Retrieval deficit;Decreased recall of new information Decreased Short Term Memory: Verbal basic;Functional basic Immediate Memory Recall: Sock;Blue;Bed Memory Recall Sock: Without Cue Memory Recall Blue: Not able to recall Memory Recall Bed: Not able to  recall Attention: Sustained Focused Attention: Appears intact Sustained Attention: Impaired Awareness Impairment: Intellectual impairment Sensation Sensation Light Touch: Appears Intact Hot/Cold: Appears Intact Proprioception: Appears Intact Stereognosis: Appears Intact Coordination Gross Motor Movements are Fluid and Coordinated: No(short step length bilaterally with mobility to the sink from the EOB.) Fine Motor Movements are Fluid and Coordinated: Yes Finger Nose Finger Test: Slower movement bilaterally but not significant deficits in function seen Motor  Motor Motor: Within Functional Limits;Abnormal postural alignment and control Motor - Skilled Clinical Observations: Generalized weakness Mobility  Bed Mobility Bed Mobility: Supine to Sit Supine to Sit: Moderate   Assistance - Patient 50-74% Transfers Sit to Stand: Minimal Assistance - Patient > 75% Stand to Sit: Minimal Assistance - Patient > 75%  Trunk/Postural Assessment  Cervical Assessment Cervical Assessment: Exceptions to WFL(forward head) Thoracic Assessment Thoracic Assessment: Exceptions to WFL(thoracic rounding) Lumbar Assessment Lumbar Assessment: Exceptions to WFL(posterior pelvic tilt in sitting with lumbar flexion in standing) Postural Control Postural Control: Deficits on evaluation Postural Limitations: flexed trunk in standing with increased flexion the longer he stood  Balance Balance Balance Assessed: Yes Static Sitting Balance Static Sitting - Balance Support: Feet supported Static Sitting - Level of Assistance: 5: Stand by assistance Dynamic Sitting Balance Dynamic Sitting - Balance Support: During functional activity Dynamic Sitting - Level of Assistance: 4: Min assist Static Standing Balance Static Standing - Balance Support: During functional activity Static Standing - Level of Assistance: 4: Min assist Dynamic Standing Balance Dynamic Standing - Balance Support: No upper extremity  supported;During functional activity Dynamic Standing - Level of Assistance: 3: Mod assist Extremity/Trunk Assessment RUE Assessment RUE Assessment: Within Functional Limits LUE Assessment LUE Assessment: Within Functional Limits     Refer to Care Plan for Long Term Goals  Recommendations for other services: None    Discharge Criteria: Patient will be discharged from OT if patient refuses treatment 3 consecutive times without medical reason, if treatment goals not met, if there is a change in medical status, if patient makes no progress towards goals or if patient is discharged from hospital.  The above assessment, treatment plan, treatment alternatives and goals were discussed and mutually agreed upon: by patient  , OTR/L 06/29/2019, 10:51 AM  

## 2019-06-29 NOTE — Progress Notes (Signed)
[facility] PHYSICAL MEDICINE & REHABILITATION PROGRESS NOTE   Subjective/Complaints:  No issues overnight, tolerated first therapy session  Review of systems negative for chest pain shortness of breath nausea vomiting diarrhea constipation  Objective:   No results found. Recent Labs    06/29/19 0636  WBC 9.3  HGB 15.5  HCT 46.6  PLT 295   Recent Labs    06/28/19 0416 06/29/19 0636  NA 137 136  K 4.1 3.9  CL 103 101  CO2 27 25  GLUCOSE 136* 135*  BUN 23 25*  CREATININE 1.07 1.02  CALCIUM 9.0 8.9    Intake/Output Summary (Last 24 hours) at 06/29/2019 1323 Last data filed at 06/29/2019 0837 Gross per 24 hour  Intake 150 ml  Output 700 ml  Net -550 ml     Physical Exam: Vital Signs Blood pressure 130/76, pulse 66, temperature 98.4 F (36.9 C), temperature source Oral, resp. rate 17, height 6' (1.829 m), weight 101.6 kg, SpO2 96 %.   General: No acute distress Mood and affect are appropriate Heart: Regular rate and rhythm no rubs murmurs or extra sounds Lungs: Clear to auscultation, breathing unlabored, no rales or wheezes Abdomen: Positive bowel sounds, soft nontender to palpation, nondistended Extremities: No clubbing, cyanosis, or edema Skin: No evidence of breakdown, no evidence of rash Neurologic: Cranial nerves II through XII intact, motor strength is 5/5 in Right 4-/5 Left deltoid, bicep, tricep, grip, hip flexor, knee extensors, ankle dorsiflexor and plantar flexor Sensory exam normal sensation to light touch and proprioception in bilateral upper and lower extremities Cerebellar exam normal finger to nose to finger as well as heel to shin in bilateral upper and lower extremities Musculoskeletal: Full range of motion in all 4 extremities. No joint swelling   Assessment/Plan: 1. 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  Physiatrist is  providing close team supervision and 24 hour management of active medical problems listed below.  Physiatrist and rehab team continue to assess barriers to discharge/monitor patient progress toward functional and medical goals  Care Tool:  Bathing    Body parts bathed by patient: Right arm, Left arm, Chest, Abdomen, Front perineal area, Buttocks, Right upper leg, Left upper leg, Right lower leg, Left lower leg, Face   Body parts bathed by helper: Front perineal area, Buttocks     Bathing assist Assist Level: Minimal Assistance - Patient > 75%     Upper Body Dressing/Undressing Upper body dressing   What is the patient wearing?: Pull over shirt    Upper body assist Assist Level: Supervision/Verbal cueing    Lower Body Dressing/Undressing Lower body dressing      What is the patient wearing?: Incontinence brief, Pants     Lower body assist Assist for lower body dressing: Minimal Assistance - Patient > 75%     Toileting Toileting    Toileting assist Assist for toileting: Dependent - Patient 0%(condom cath)     Transfers Chair/bed transfer  Transfers assist  Chair/bed transfer activity did not occur: Safety/medical concerns  Chair/bed transfer assist level: Minimal Assistance - Patient > 75%     Locomotion Ambulation   Ambulation assist   Ambulation activity did not occur: Safety/medical concerns  Assist level: Minimal Assistance - Patient > 75% Assistive device: Walker-rolling Max distance: 5'   Walk 10 feet activity   Assist  Walk 10 feet activity did not occur: Safety/medical concerns  Walk 50 feet activity   Assist Walk 50 feet with 2 turns activity did not occur: Safety/medical concerns         Walk 150 feet activity   Assist Walk 150 feet activity did not occur: Safety/medical concerns         Walk 10 feet on uneven surface  activity   Assist Walk 10 feet on uneven surfaces activity did not occur: Safety/medical  concerns         Wheelchair     Assist     Wheelchair activity did not occur: Safety/medical concerns         Wheelchair 50 feet with 2 turns activity    Assist    Wheelchair 50 feet with 2 turns activity did not occur: Safety/medical concerns       Wheelchair 150 feet activity     Assist  Wheelchair 150 feet activity did not occur: Safety/medical concerns       Blood pressure 130/76, pulse 66, temperature 98.4 F (36.9 C), temperature source Oral, resp. rate 17, height 6' (1.829 m), weight 101.6 kg, SpO2 96 %.  Medical Problem List and Plan: 1.  Impaired function, initiation ADLs and mobility secondary to R MCA 2 small infarcts with Mild L>R weakness and poor initiation              -patient may  shower             -ELOS/Goals: 10-14 days- mod I to supervision 2.  Antithrombotics: -DVT/anticoagulation:  Pharmaceutical: Lovenox added 5/06             -antiplatelet therapy: DAPT 3. Pain Management: Tylenol prn.  4. Mood: LCSW to follow for evaluation and support.              -antipsychotic agents: N/A 5. Neuropsych: This patient is not fully capable of making decisions on his own behalf. 6. Skin/Wound Care: Routine pressure relief measures.  7. Fluids/Electrolytes/Nutrition: Monitor I/O. Check lytes in am.  8. R-MCA infarct: DAPT x 3 months followed by Plavix alone--cardiac event monitor prior to discharge. Will contact cardiology once d/c date set.  9. T2DM: Hgb A1c- 7.2.   Was on Oxempic, metformin and glipizide have been on hold. Monitor BS ac/hs and use SSI for elevated BS. Resume low dose metformin at 500 mg bid and slowly resume home meds as BS controlled on diet alone.  CBG (last 3)  Recent Labs    06/28/19 2106 06/29/19 0637 06/29/19 1133  GLUCAP 209* 114* 162*  some lability we will continue to monitor 10. HTN: Monitor BP tid--continue Lasix, Amlodipine, HCTZ and Cozaar.  Follow up BMET in am.  11. H/o depression:Celexa bid was changed to  Zoloft.  May need agent for activation (small vessel disease and minimal activity at baseline)-12. Dyslipidemia: On Lipitor.     LOS: 1 days A FACE TO Nichols E Tyion Boylen 06/29/2019, 1:23 PM

## 2019-06-29 NOTE — Evaluation (Signed)
Physical Therapy Assessment and Plan  Patient Details  Name: Justin [name] MRN: [id number] Date of Birth: 1940/04/02  PT Diagnosis: Abnormal posture, Abnormality of gait, Cognitive deficits, Difficulty walking, Hemiparesis non-dominant, Impaired cognition and Muscle weakness Rehab Potential: Good ELOS: ~2-2.5 weeks   Today's Date: 06/29/2019 PT Individual Time: 4315-4008 PT Individual Time Calculation (min): 75 min    Problem List:  Patient Active Problem List   Diagnosis Date Noted  . Acute right MCA stroke ([place]) 06/28/2019  . Ambulatory dysfunction   . Stroke ([place]) 06/24/2019  . Type 2 diabetes mellitus ([place]) 10/04/2018  . Depression 10/04/2018  . Acute CVA (cerebrovascular accident) ([place]) 09/18/2017  . Stroke (cerebrum) ([place]) 09/17/2017  . Diabetes mellitus without complication ([place]) 67/61/9509  . Closed head injury 04/22/2013  . HLD (hyperlipidemia) 07/29/2009  . Essential hypertension 07/29/2009  . EFFUSION, PLEURAL 07/29/2009  . DIVERTICULITIS, COLON 07/29/2009  . 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 COLONIC POLYPS, HYPERPLASTIC, HX OF 07/29/2009    Past Medical History:  Past Medical History:  Diagnosis Date  . Brain bleed ([place])   . Cancer [facility])    prostate  . 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 Depression   . Diabetes mellitus without complication ([place])   . High cholesterol   . Hypertension   . Stroke [facility])    Past Surgical History:  Past Surgical History:  Procedure Laterality Date  . arm & hand surgery Left 1972   skill saw accident  . COLON SURGERY     partial  . EYE SURGERY     lens implant and cateract- Left  . PROSTATECTOMY      Assessment & Plan Clinical Impression: Patient is a 79 y.o. year old male with history of HTN, T2DM, C HI-04/2013, lumbar radiculopathy with gait disorder/falls, HTN, multiple prior CVA--last 09/2018 with minimal residual effects.  He was admitted on 06/24/2019 with multiple falls in the week prior to admission,  speech difficulty and weakness.  MRI brain done revealing 2 small acute infarcts in right MCA territory and advanced chronic ischemic changes with widespread atrophy advanced for age.  CTA head/neck was negative for high-grade occlusion or stenosis but multifocal areas of atherosclerotic irregularity noted.  Carotid Dopplers were negative ICA stenosis.  BLE Dopplers negative for DVT.  2D echo showed EF of 70 to 75%, hyperdynamic function with grade 1 diastolic dysfunction and no shunt.  Dr. Erlinda Hong felt the stroke was due to small vessel disease and recommended DAPT x3 months followed by Plavix alone as well as 30-day event monitor to rule out A. fib given 2 separate stroke locations.--Cardiology to be contacted for event monitor prior to discharge..  Patient with resultant balance deficits, right lateral lean, impaired cognition with decrease in activity tolerance.  CIR was recommended due to functional decline. Patient transferred to CIR on 06/28/2019 .   Patient currently requires max assist with mobility secondary to muscle weakness, decreased cardiorespiratoy endurance, impaired timing and sequencing, unbalanced muscle activation, motor apraxia, decreased coordination and decreased motor planning,  , decreased initiation, decreased attention, decreased awareness, decreased problem solving, decreased safety awareness, decreased memory and delayed processing and decreased sitting balance, decreased standing balance, decreased postural control and decreased balance strategies.  Prior to hospitalization, patient was modified independent  with mobility and lived with Spouse in a House home.  Home access is 3Stairs to enter.  Patient will benefit from skilled PT intervention to maximize safe functional mobility, minimize fall risk and decrease caregiver burden for  planned discharge home with 24 hour assist.  Anticipate patient will benefit from follow up [facility] at discharge.  PT - End of Session Activity Tolerance:  Tolerates 30+ min activity with multiple rests Endurance Deficit: Yes Endurance Deficit Description: requires seated rest breaks PT Assessment Rehab Potential (ACUTE/IP ONLY): Good PT Barriers to Discharge: Inaccessible home environment;Home environment access/layout PT Patient demonstrates impairments in the following area(s): Balance;Perception;Behavior;Safety;Edema;Sensory;Endurance;Skin Integrity;Motor;Nutrition;Pain PT Transfers Functional Problem(s): Bed Mobility;Bed to Chair;Car;Furniture PT Locomotion Functional Problem(s): Ambulation;Wheelchair Mobility;Stairs PT Plan PT Intensity: Minimum of 1-2 x/day ,45 to 90 minutes PT Frequency: 5 out of 7 days PT Duration Estimated Length of Stay: ~2-2.5 weeks PT Treatment/Interventions: Ambulation/gait training;Community reintegration;DME/adaptive equipment instruction;Neuromuscular re-education;Psychosocial support;Stair training;UE/LE Strength taining/ROM;Wheelchair propulsion/positioning;Balance/vestibular training;Discharge planning;Functional electrical stimulation;Pain management;Skin care/wound management;UE/LE Coordination activities;Therapeutic Activities;Cognitive remediation/compensation;Disease management/prevention;Functional mobility training;Patient/family education;Splinting/orthotics;Visual/perceptual remediation/compensation;Therapeutic Exercise PT Transfers Anticipated Outcome(s): CGA PT Locomotion Anticipated Outcome(s): CGA using LRAD PT Recommendation Recommendations for Other Services: Therapeutic Recreation consult Therapeutic Recreation Interventions: Outing/community reintergration Follow Up Recommendations: Home health PT;24 hour supervision/assistance Patient destination: Home Equipment Recommended: To be determined  Skilled Therapeutic Intervention Evaluation completed (see details above and below) with education on PT POC and goals and individual treatment initiated with focus on bed mobility, transfers, gait  training, stair navigation, activity tolerance, and education regarding daily therapy schedule, weekly team meetings, purpose of PT evaluation, and other CIR information. Pt received sitting in w/c and agreeable to therapy session. Squat pivot transfer w/c<>EOB with min assist for lifting/pivoting hips and cuing for sequencing. Sit>supine with CGA for trunk steadying. Supine>sitting L EOB with mod assist for trunk upright then once seated EOB pt had posterior LOB requiring max assist to maintain upright then continued to require min assist until able to regain static balance in midline.  Transported to/from gym in w/c for time management and energy conservation. Ambulatory (~65f) car transfer using quad-cane (to replicate pt's use of cane at home) with mod assist for balance as pt demonstrates poor safety awareness and side-stepping into the car despite question cuing to sit first for increased safety. Attempted ambulation with R UE support on quad-cane with pt ambulating ~566fwith mod assist and significant forward trunk flexion, shuffling steps, and poor L lateral weight shift during stance and worsening balance therefore transitioned to RW for increased pt safety and ambulated ~1036fo EOM using RW with min/mod assist and max multimodal cuing for turning fully prior to sitting on mat. Gait training 2f41fing RW with min progressed to mod assist for balance and AD management with pt demonstrating significant gait impairments of R forward flexed trunk, decreased L lateral weight shift during stance with decreased L stance time and step-to pattern leading with L LE - provided manual facilitation and multimodal cuing for improvement but limited. Therapist provided pt with w/c cushion for increased pressure relief. Gait training [id number] RW with mod assist due to worsening R lateral trunk flexion, poor L lateral weight shift during stance, step-to pattern leading with L LE, and gradual worsening of gait mechanics  after ~5-6steps with shuffling and some freezing of movement noted- requires standing "resets" to improve upright trunk posture and manual facilitation for L lateral weight shift to attempt reciprocal stepping pattern. Stand pivot to w/c using RW with min/mod assist and manual facilitation for R/L lateral weight shifting as pt has difficulty initiating stepping with some freezing noted with shuffled steps. Ascended/descended 4 steps using B HRs (despite only having 1 HR at home) with mod assist on ascent with attempts at  reciprocal pattern with difficulty pushing up with L LE and then step-to pattern leading with L LE on descent requiring heavy max assist and +2 assist for safety and facilitating forward movement of UEs on handrails due to strong posterior lean and max assist to pivot and sit in w/c at bottom of steps. Pt reports onset of R "knee cap" pain when descending the stairs. Transported back to room.and pt requesting to return to bed. R squat pivot to EOB with min/mod assist for pivoting hips. Sit>supine with min assist this time due to fatigue and poor trunk control. Pt left supine in bed with needs in reach and bed alarm on.  PT Evaluation Precautions/Restrictions Precautions Precautions: Fall;Other (comment) Precaution Comments: posterior and R lateral lean and h/o falls; HOH (using amplifier) Restrictions Weight Bearing Restrictions: No Pain Pain Assessment Pain Scale: 0-10 Pain Score: 0-No pain  Initially reports 0/10 pain at beginning of session but during stair navigation reports R "knee cap" pain - unrated but provided seated rest break.  Home Living/Prior Functioning Home Living Living Arrangements: Spouse/significant other;Children Available Help at Discharge: Family;Available 24 hours/day Type of Home: House Home Access: Stairs to enter [employer] of Steps: 3 Entrance Stairs-Rails: Right Home Layout: One level  Lives With: Spouse(wife, [name]) Prior  Function Level of Independence: Independent with homemaking with ambulation;Independent with gait;Independent with transfers;Requires assistive device for independence(reports "once in a while" required assist to stand from chair; reports used a quad-cane but recently got a RW)  Able to Take Stairs?: Yes Driving: No Comments: pt reports ambulating with quad-cane mod-I with intermittent assist to stand from chair; per chart review has had a functional decline in past 3-67month; and per pt he has had recent falls Perception  Perception Perception: Within Functional Limits Praxis Praxis: Impaired Praxis Impairment Details: Motor planning  Cognition Overall Cognitive Status: Impaired/Different from baseline Arousal/Alertness: Awake/alert Orientation Level: Oriented to person;Disoriented to place;Disoriented to time;Oriented to situation(initially states he is at "[employer] able to correct himself to "[facility] with min cuing; states it is "six" when attempting to state month then says "August" but oriented to day being Friday and year) Attention: Focused;Sustained Focused Attention: Appears intact Sustained Attention: Impaired Memory: Impaired Awareness: Impaired(unaware of deficits during gait) Reasoning: Impaired Safety/Judgment: Impaired Sensation Sensation Light Touch: Appears Intact(intact on screen) Hot/Cold: Not tested Proprioception: Impaired by gross assessment(impaired proprioceptive awareness noted during functional mobility) Stereognosis: Not tested Coordination Gross Motor Movements are Fluid and Coordinated: No Coordination and Movement Description: impaired due to generalized weakness and impaired balance Motor  Motor Motor: Other (comment);Abnormal postural alignment and control Motor - Skilled Clinical Observations: L LE paresis compared to R LE  Mobility Bed Mobility Bed Mobility: Supine to Sit;Sit to Supine Supine to Sit: Moderate Assistance - Patient  50-74% Sit to Supine: Minimal Assistance - Patient > 75% Transfers Transfers: Sit to Stand;Stand to Sit;Stand Pivot Transfers;Squat Pivot Transfers Sit to Stand: Minimal Assistance - Patient > 75% Stand to Sit: Minimal Assistance - Patient > 75% Stand Pivot Transfers: Moderate Assistance - Patient 50 - 74% Stand Pivot Transfer Details: Tactile cues for sequencing;Tactile cues for posture;Tactile cues for weight shifting;Manual facilitation for placement;Manual facilitation for weight shifting;Verbal cues for safe use of DME/AE;Verbal cues for gait pattern;Verbal cues for technique;Verbal cues for sequencing Squat Pivot Transfers: Moderate Assistance - Patient 50-74%;Minimal Assistance - Patient > 75% Transfer (Assistive device): Rolling walker Locomotion  Gait Ambulation: Yes Gait Assistance: Moderate Assistance - Patient 50-74% Gait Distance (Feet): [place]  device: Rolling walker Gait Assistance Details: Tactile cues for sequencing;Tactile cues for initiation;Tactile cues for weight shifting;Tactile cues for posture;Tactile cues for placement;Verbal cues for technique;Verbal cues for sequencing;Verbal cues for safe use of DME/AE;Verbal cues for gait pattern;Manual facilitation for weight shifting;Manual facilitation for placement Gait Gait: Yes Gait Pattern: Impaired Gait Pattern: Step-to pattern;Decreased stride length;Decreased stance time - left;Decreased step length - right;Decreased step length - left;Poor foot clearance - left;Poor foot clearance - right;Lateral trunk lean to right;Trunk flexed Gait velocity: decreased Stairs / Additional Locomotion Stairs: Yes Stairs Assistance: 2 Helpers;Maximal Assistance - Patient 25 - 49% Stair Management Technique: Two rails Number of Stairs: 4 Height of Stairs: 6 Wheelchair Mobility Wheelchair Mobility: No  Trunk/Postural Assessment  Cervical Assessment Cervical Assessment: Exceptions to WFL(forward head) Thoracic  Assessment Thoracic Assessment: Exceptions to WFL(thoracic rounding) Lumbar Assessment Lumbar Assessment: Exceptions to WFL(posterior pelvic tilt in sitting) Postural Control Postural Control: Deficits on evaluation Trunk Control: posterior lean in sitting requiring max assist to prevent LOB with fatigue Postural Limitations: decreased with R lateral trunk flexion in standing that worsens with fatigue  Balance Balance Balance Assessed: Yes Static Sitting Balance Static Sitting - Balance Support: Feet supported Static Sitting - Level of Assistance: 5: Stand by assistance;2: Max assist Static Sitting - Comment/# of Minutes: with fatigue has posterior LOB requiring max assist to maintain upright Dynamic Sitting Balance Dynamic Sitting - Balance Support: During functional activity Dynamic Sitting - Level of Assistance: 3: Mod assist Static Standing Balance Static Standing - Balance Support: During functional activity Static Standing - Level of Assistance: 4: Min assist;3: Mod assist Dynamic Standing Balance Dynamic Standing - Balance Support: During functional activity Dynamic Standing - Level of Assistance: 3: Mod assist;2: Max assist Extremity Assessment      RLE Assessment RLE Assessment: Within Functional Limits Active Range of Motion (AROM) Comments: WFL General Strength Comments: 4+/5 to 5/5 demonstrated in sitting LLE Assessment LLE Assessment: Exceptions to [facility] Active Range of Motion (AROM) Comments: WFL LLE Strength Left Hip Flexion: 4/5 Left Knee Flexion: 3+/5 Left Knee Extension: 4-/5 Left Ankle Dorsiflexion: 4/5 Left Ankle Plantar Flexion: 4/5    Refer to Care Plan for Long Term Goals  Recommendations for other services: Therapeutic Recreation  Outing/community reintegration  Discharge Criteria: Patient will be discharged from PT if patient refuses treatment 3 consecutive times without medical reason, if treatment goals not met, if there is a change in medical  status, if patient makes no progress towards goals or if patient is discharged from hospital.  The above assessment, treatment plan, treatment alternatives and goals were discussed and mutually agreed upon: by patient  Tawana Scale, PT, DPT 06/29/2019, 7:58 AM
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
## 2019-06-30 ENCOUNTER — Inpatient Hospital Stay (HOSPITAL_COMMUNITY): Payer: Medicare HMO

## 2019-06-30 ENCOUNTER — Inpatient Hospital Stay (HOSPITAL_COMMUNITY): Payer: Medicare HMO | Admitting: Speech Pathology

## 2019-06-30 DIAGNOSIS — I63511 Cerebral infarction due to unspecified occlusion or stenosis of right middle cerebral artery: Secondary | ICD-10-CM | POA: Diagnosis not present

## 2019-06-30 LAB — GLUCOSE, CAPILLARY
Glucose-Capillary: 133 mg/dL — ABNORMAL HIGH (ref 70–99)
Glucose-Capillary: 110 mg/dL — ABNORMAL HIGH (ref 70–99)
Glucose-Capillary: 146 mg/dL — ABNORMAL HIGH (ref 70–99)
Glucose-Capillary: 99 mg/dL (ref 70–99)

## 2019-06-30 MED ORDER — SEMAGLUTIDE(0.25 OR 0.5MG/DOS) 2 MG/1.5ML ~~LOC~~ SOPN
0.2500 mg | PEN_INJECTOR | SUBCUTANEOUS | Status: DC
  Administered 2019-06-30: 18:00:00 0.25 mg via SUBCUTANEOUS

## 2019-06-30 MED ORDER — GLIPIZIDE 5 MG PO TABS
5.0000 mg | ORAL_TABLET | Freq: Every day | ORAL | Status: DC
  Administered 2019-07-01 – 2019-07-11 (×11): 5 mg via ORAL
  Filled 2019-06-30 (×12): qty 1

## 2019-06-30 NOTE — Progress Notes (Signed)
Physical Therapy Session Note  Patient Details  Name: Justin [name] MRN: [id number] Date of Birth: 10-12-1940  Today's Date: 06/30/2019 PT Individual Time: 1415-1525 PT Individual Time Calculation (min): 70 min    Short Term Goals: Week 1:  PT Short Term Goal 1 (Week 1): Pt will perform supine<>sit with CGA PT Short Term Goal 2 (Week 1): Pt will perform sit<>stands using LRAD with CGA PT Short Term Goal 3 (Week 1): Pt will perform stand pivot transfers using LRAD with min assist PT Short Term Goal 4 (Week 1): Pt will ambulate at least 28ft using LRAD with min assist  Skilled Therapeutic Interventions/Progress Updates:    Pt supine in bed upon PT arrival, agreeable to therapy tx and denies pain at rest, reports knee pain 6/10 L knee when ambulating. Pt donned amplified with assist to turn it on. Pt transferred to sitting mod assist, stand pivot to w/c with min assist and transported to the gym. Pt ambulated x 40 ft to the mat with RW and min assist, step to pattern with decreased L stance time secondary to pain, limited gait distance d/t pain. Throughout session while seated on mat with with R lateral lean, mirror for visual feedback and cues to correct this, cues for attention to R lean as well. Pt worked on standing balance and foot clearance to perform toe taps on aerobic step x 2 trials with min assist, cues for techniques. Pt able to perform sit<>stands from mat this session without RW however reports increased knee pain. In standing without AD pt worked on standing balance while performing horseshoe toss x2 trials, CGA for balance with cues for upright posture (pt overall flexed and increased weightshifted over R side). Pt transferred to supine on mat with mod assist, in supine pt performed 2 x 10 bridges for strengthening with assist for LE position. Rolling on the mat with min assist for either directions, cues for techniques. In sidelying pt performed 2 x 10 clamshells for hip abductor  strengthening. Therapist performed hip flexor and hamstring stretching this session x 30 sec per stretch bilaterally. Pt transferred to sitting from mat with min assist, cues for techniques. Pt ambulated x 20 ft and x 45 ft with RW and min assist, cues for bigger step length however pt reports increased pain with increased step length. Pt transported back to room, requesting to get back in bed. Pt performed stand pivot to bed with min assist , sit>supine mod assist and left with needs in reach and bed alarm set.   Therapy Documentation Precautions:  Precautions Precautions: Fall, Other (comment) Precaution Comments: posterior and R lateral lean and h/o falls; HOH (using amplifier) Restrictions Weight Bearing Restrictions: No    Therapy/Group: Individual Therapy  Netta Corrigan, PT, DPT, CSRS 06/30/2019, 9:33 AM
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
## 2019-06-30 NOTE — Progress Notes (Signed)
Occupational Therapy Session Note  Patient Details  Name: Justin [name] MRN: [id number] Date of Birth: [birth year]  Today's Date: 06/30/2019 OT Individual Time: [id number] OT Individual Time Calculation (min): 75 min    Short Term Goals: Week 1:     Skilled Therapeutic Interventions/Progress Updates:    OT session focused on dynamic standing balance, strengthening, functional transfers, and activity tolerance. Pt received sitting in recliner agreeable to therapy and declining B&D this AM. Completed stand pivot transfers throughout session with min A using RW. OT transitioned pt to therapy gym completing 2x 20 reps of seated marches and alternating LE shuffles prior to activities in standing. Completed sit<>stand on foam balance pad 8x with min A. OT challenged balance by client completing standing marching 2x 14-18 reps with support of RW, elevating 1-2 hands from RW for 10-12 seconds, and closing eyes while in standing. OT provided tactile and verbal cues for upright posture due to forward flexion with fatigue. Pt remained on balance pad for 30-90 seconds each time. Engaged in dynamic reaching while standing on pad placing and removing horseshoes from overhead target 6x with 3 rest breaks. Task utilized to facilitate forward and lateral weight shift. At end of session, returned to room with pt completing toileting in standing with min A. Transferred back to bed with min A and left supine with all needs in reach.   Therapy Documentation Precautions:  Precautions Precautions: Fall, Other (comment) Precaution Comments: posterior and R lateral lean and h/o falls; HOH (using amplifier) Restrictions Weight Bearing Restrictions: No General:   Vital Signs: Therapy Vitals Temp: 97.8 F (36.6 C) Temp Source: Oral Pulse Rate: 60 BP: 127/65 Patient Position (if appropriate): Lying Oxygen Therapy SpO2: 97 % Pain:   ADL: ADL Eating: Independent Where Assessed-Eating: Wheelchair Grooming:  Minimal assistance Where Assessed-Grooming: Standing at sink Upper Body Bathing: Supervision/safety Where Assessed-Upper Body Bathing: Edge of bed Lower Body Bathing: Minimal assistance Where Assessed-Lower Body Bathing: Edge of bed Upper Body Dressing: Supervision/safety Where Assessed-Upper Body Dressing: Edge of bed Lower Body Dressing: Minimal assistance Where Assessed-Lower Body Dressing: Edge of bed Toileting: Minimal assistance Where Assessed-Toileting: Bedside Commode Toilet Transfer: Minimal assistance Toilet Transfer Method: Counselling psychologist: Raised toilet seat, Grab bars Vision   Perception    Praxis   Exercises:   Other Treatments:     Therapy/Group: Individual Therapy  Duayne Cal 06/30/2019, 8:33 AM

## 2019-06-30 NOTE — Progress Notes (Signed)
Speech Language Pathology Daily Session Note  Patient Details  Name: Justin [name] MRN: [id number] Date of Birth: Sep 23, 1940  Today's Date: 06/30/2019 SLP Individual Time: 1047-1130 SLP Individual Time Calculation (min): 43 min  Short Term Goals: Week 1: SLP Short Term Goal 1 (Week 1): Pt will demonstrate sustained attention during functional tasks in 10 minute intervals with min A verbal cues for redirection. SLP Short Term Goal 2 (Week 1): Pt will complete basic problem solving tasks with min A verbal cues. SLP Short Term Goal 3 (Week 1): Pt will utilize external aids to recall daily and novel information with min A verbal cues. SLP Short Term Goal 4 (Week 1): Pt will demonstrated intellectual awareness by identiftying 2 cognitive and 2 physical impairments due to acute CVA and their impact on daily function with min A verbal cues.  Skilled Therapeutic Interventions: Pt was seen for skilled ST targeting cognitive goals. Pt oriented to season, but required Min A with use of calendar to orient to month and date. Calendar left posted in room. Pt independently oriented to situation and place. Functional conversation utilized to target intellectual awareness, during which he required Moderate verbal cues to identify 1 physical and 2 cognitive impairments. He also required Moderate verbal assistance to identify 2 situations he would need to use call bell (although he demonstrated how to operate it without cues). Pt required overall Max A verbal and visual cues for initiation and problem solving basic calculations during a novel basic card task (11 up). He required only Min verbal cues for redirection to tasks throughout session. Pt left laying in bed with alarm set and needs within reach. Continue per current plan of care.       Pain Pain Assessment Pain Scale: 0-10 Pain Score: 0-No pain  Therapy/Group: Individual Therapy  Arbutus Leas 06/30/2019, 7:10 AM

## 2019-07-01 ENCOUNTER — Inpatient Hospital Stay (HOSPITAL_COMMUNITY): Payer: Medicare HMO

## 2019-07-01 LAB — GLUCOSE, CAPILLARY
Glucose-Capillary: 112 mg/dL — ABNORMAL HIGH (ref 70–99)
Glucose-Capillary: 94 mg/dL (ref 70–99)
Glucose-Capillary: 103 mg/dL — ABNORMAL HIGH (ref 70–99)
Glucose-Capillary: 125 mg/dL — ABNORMAL HIGH (ref 70–99)

## 2019-07-01 MED ORDER — SEMAGLUTIDE(0.25 OR 0.5MG/DOS) 2 MG/1.5ML ~~LOC~~ SOPN
0.2500 mg | PEN_INJECTOR | SUBCUTANEOUS | Status: DC
  Administered 2019-07-09: 0.25 mg via SUBCUTANEOUS

## 2019-07-01 NOTE — Progress Notes (Signed)
Physical Therapy Session Note  Patient Details  Name: Justin [name] MRN: [id number] Date of Birth: [birth year]  Today's Date: 07/01/2019 PT Individual Time: 1555-1650 PT Individual Time Calculation (min): 55 min    Short Term Goals: Week 1:  PT Short Term Goal 1 (Week 1): Pt will perform supine<>sit with CGA PT Short Term Goal 2 (Week 1): Pt will perform sit<>stands using LRAD with CGA PT Short Term Goal 3 (Week 1): Pt will perform stand pivot transfers using LRAD with min assist PT Short Term Goal 4 (Week 1): Pt will ambulate at least 77ft using LRAD with min assist  Skilled Therapeutic Interventions/Progress Updates:    Pt supine in bed upon PT arrival, agreeable to therapy tx and denies pain at rest. Wife present and she reports that the patient was incontinent of bladder and she just helped him change brief in the bed. This therapist present to help finish donning brief/shorts. Pt transferred to sitting EOB with mod assist, sit>stand min assist with RW in order to pull brief/pants over hips. Pt ambulated to the w/c x10 ft with RW and min assist, cues for increased step length and continuing to turn before sitting in chair. While sitting in w/c therapist assisted pt to don clean shirt. Pt transported to the gym. Pt ambulated x 15 ft to the nustep with RW and min assist, cues for increased step length. Pt used nustep this session x 5 minutes on workload 5 for global strengthening and ROM using B UE s/LE s. Pt ambulated x 30 ft, x 20 ft and x 25 ft this session working on gait training and turning to sit, pt with increased shuffling and increased cues needed for foot placement with turning to sit demonstrating difficulty with lateral left weightshift/stepping with R leg. Pt performed blocked practice of stepping over hockey stick in place with R LE working on step length/foot clearance using RW for UE support, x 10 steps and then x 12 steps with cues not to step on stick - min/mod assist with manual  facilitation for L lateral weightshift, would occasionally catch foot. Pt then ambulated x 20 ft, does not demonstrate carry over of R step length/foot clearance despite continued cues, min assist overall for gait with RW. Pt ascended/descended 6 steps (3 inch) using B rails with cues for reciprocal pattern in order to facilitate increased step length/step through, min assist. Pt transported back to room at end of session, left with needs in reach and chair alarm set.    Therapy Documentation Precautions:  Precautions Precautions: Fall, Other (comment) Precaution Comments: posterior and R lateral lean and h/o falls; HOH (using amplifier) Restrictions Weight Bearing Restrictions: No    Therapy/Group: Individual Therapy  Netta Corrigan, PT, DPT, CSRS 07/01/2019, 8:00 AM

## 2019-07-02 ENCOUNTER — Inpatient Hospital Stay (HOSPITAL_COMMUNITY): Payer: Medicare HMO | Admitting: Occupational Therapy

## 2019-07-02 ENCOUNTER — Encounter (HOSPITAL_COMMUNITY): Payer: Medicare HMO | Admitting: Psychology

## 2019-07-02 ENCOUNTER — Inpatient Hospital Stay (HOSPITAL_COMMUNITY): Payer: Medicare HMO

## 2019-07-02 DIAGNOSIS — I63511 Cerebral infarction due to unspecified occlusion or stenosis of right middle cerebral artery: Secondary | ICD-10-CM | POA: Diagnosis not present

## 2019-07-02 DIAGNOSIS — F331 Major depressive disorder, recurrent, moderate: Secondary | ICD-10-CM

## 2019-07-02 DIAGNOSIS — R269 Unspecified abnormalities of gait and mobility: Secondary | ICD-10-CM

## 2019-07-02 LAB — BASIC METABOLIC PANEL
Anion gap: 13 (ref 5–15)
BUN: 34 mg/dL — ABNORMAL HIGH (ref 8–23)
CO2: 24 mmol/L (ref 22–32)
Calcium: 9.5 mg/dL (ref 8.9–10.3)
Chloride: 98 mmol/L (ref 98–111)
Creatinine, Ser: 1.03 mg/dL (ref 0.61–1.24)
GFR calc Af Amer: >60 mL/min (ref >60)
GFR calc non Af Amer: >60 mL/min (ref >60)
Glucose, Bld: 111 mg/dL — ABNORMAL HIGH (ref 70–99)
Potassium: 3.7 mmol/L (ref 3.5–5.1)
Sodium: 135 mmol/L (ref 135–145)

## 2019-07-02 LAB — CBC
HCT: 46.8 % (ref 39.0–52.0)
Hemoglobin: 15.7 g/dL (ref 13.0–17.0)
MCH: 27.9 pg (ref 26.0–34.0)
MCHC: 33.5 g/dL (ref 30.0–36.0)
MCV: 83.1 fL (ref 80.0–100.0)
Platelets: 291 10*3/uL (ref 150–400)
RBC: 5.63 MIL/uL (ref 4.22–5.81)
RDW: 12.8 % (ref 11.5–15.5)
WBC: 9 10*3/uL (ref 4.0–10.5)
nRBC: 0 % (ref 0.0–0.2)

## 2019-07-02 LAB — GLUCOSE, CAPILLARY
Glucose-Capillary: 106 mg/dL — ABNORMAL HIGH (ref 70–99)
Glucose-Capillary: 106 mg/dL — ABNORMAL HIGH (ref 70–99)
Glucose-Capillary: 99 mg/dL (ref 70–99)
Glucose-Capillary: 84 mg/dL (ref 70–99)

## 2019-07-02 NOTE — Consult Note (Signed)
Neuropsychological Consultation   Patient:   Justin [name]   DOB:   04-02-1940  MR Number:  [id number]  Location:  [facility] [id number] [place] [address] Dept: [place]: [phone]           Date of Service:   07/02/2019  Start Time:   8 AM End Time:   9 AM  Provider/Observer:  Ilean Skill, Psy.D.       Clinical Neuropsychologist       Billing Code/Service: [phone]  Chief Complaint:    Justin [name] is a 79 year old male with history of HTN, diabetes, C HI, lumbar radiculopathy with gait disorder/falls, and multiple prior CVA.  Patient also had a TBI with SAH that wife says was the start of his decline.  Patient with history of depression as well.  Patient was admitted on 06/24/2019 with multiple falls in the week prior to admission, speech difficulty and weakness.  MRI brain done revealing 2 small acute infarcts in right MCA territory and advanced chronic ischemic changes with widespread atrophy advanced for age.  Widespread SVD.  Dr. Erlinda Hong felt stroke was due to SVD.  Patient with prior and worsened balance deficits, right lateral lean, impaired cognition with decreased activity tolerance.  Wife is concerned about patient's prior activity level and that he really declined after the prior [facility].  He would improve after therapies but then go home and just watch TV and expect wife to do everything.  Lack of initiation from previous strokes and falls.    Reason for Service:  Patient referred for neuropsychological evaluation due to motivation and initiation issues.  Below is the HPI for the current admission.  HPI: Justin [name]. 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  He was admitted on 06/24/2019 with multiple falls in the week prior to admission, speech  difficulty and weakness.  MRI brain done revealing 2 small acute infarcts in right MCA territory and advanced chronic ischemic changes with widespread atrophy advanced for age.  CTA head/neck was negative for high-grade occlusion or stenosis but multifocal areas of atherosclerotic irregularity noted.  Carotid Dopplers were negative ICA stenosis.  BLE Dopplers negative for DVT.  2D echo showed EF of 70 to 75%, hyperdynamic function with grade 1 diastolic dysfunction and no shunt.  Dr. Erlinda Hong felt the stroke was due to small vessel disease and recommended DAPT x3 months followed by Plavix alone as well as 30-day event monitor to rule out A. fib given 2 separate stroke locations.--Cardiology to be contacted for event monitor prior to discharge..  Patient with resultant balance deficits, right lateral lean, impaired cognition with decrease in activity tolerance.  CIR was recommended due to functional decline  Wife very concerned that [name], and could benefit from Amantadine.   Current Status:  Patient awake and siting up in bed when I arrived.  Expressive and receptive language functional.  Slowed information processing and self insight was limited.  Patient oriented and memory is functional.  Did not see patient performing motor tasks but can move all limbs.  Patient had very little questions to ask.  Reported that  wife "keeps everything organized" and is "getting everything ready for him to return."  Patient acted like there was no issue with his activity level before admission but did report that it was hard for him to do a lot due to falls.  Behavioral Observation: Justin [name]  presents as a 79 y.o.-year-old Right Caucasian Male who appeared his stated age. his dress was Appropriate and he was Well Groomed and his manners were  Appropriate to the situation.  his participation was indicative of Appropriate and Redirectable behaviors.  There were any physical disabilities noted.  he displayed an appropriate level of cooperation and motivation.     Interactions:    Active Appropriate and Redirectable  Attention:   abnormal and attention span appeared shorter than expected for age  Memory:   abnormal; remote memory intact, [name]; slowed  Thought Process:  Coherent and Relevant  Though Content:  WNL; not suicidal and not homicidal  Orientation:   person, place and time/date  Judgment:   Fair  Planning:   Poor  Affect:    Blunted and Lethargic  Mood:    Dysphoric  Insight:   Shallow  Intelligence:   normal  Medical History:   Past Medical History:  Diagnosis Date  . Brain bleed ([place])   . Cancer [facility])    prostate  . 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 Depression   . Diabetes mellitus without complication ([place])   . High cholesterol   . Hypertension   . Stroke [facility])    Psychiatric History:  Past history of depression  Family Med/Psych History:  Family History  Problem Relation Age of Onset  . CAD Mother   . Diabetes Mother   . Emphysema Father   . Diabetes Sister   . Diabetes Brother   . Diabetes Sister   . Diabetes Brother   . Diabetes Mellitus II Neg Hx    Impression/DX:  Justin [name] is a 79 year old male with history of HTN, diabetes, C HI, lumbar radiculopathy with gait disorder/falls, and multiple prior CVA.  Patient also had a TBI with SAH that wife says was the start of his decline.  Patient with history of depression as well.  Patient was admitted on 06/24/2019 with multiple falls in the week prior to admission, speech difficulty and weakness.  MRI brain done revealing 2 small acute infarcts in right MCA territory and advanced chronic ischemic changes with widespread atrophy advanced  for age.  Widespread SVD.  Dr. Erlinda Hong felt stroke was due to SVD.  Patient with prior and worsened balance deficits, right lateral lean, impaired cognition with decreased activity tolerance.  Wife is concerned about patient's prior activity level and that he really declined after the prior [facility].  He would improve after therapies but then go home and just watch TV and expect wife to do everything.  Lack of initiation from previous strokes and falls.   Patient awake and siting up in bed when I arrived.  Expressive and receptive language functional.  Slowed information processing and self insight was limited.  Patient oriented and memory is functional.  Did not see patient performing motor tasks but can move all limbs.  Patient had very little questions to ask.  Reported that wife "keeps everything organized" and is "getting everything ready for him to return."  Patient acted like there was no issue with  his activity level before admission but did report that it was hard for him to do a lot due to falls.  Disposition/Plan:  Will follow-up next week.  Today we worked on his awareness and understanding need to be as active has he can and to continue to work on therapy type activities once he goes home to maintain gains he makes in both CIR and outpatient therapies.         Electronically Signed   _______________________ Ilean Skill, Psy.D.

## 2019-07-02 NOTE — Progress Notes (Signed)
Patient resting comfortably on home CPAP unit. No assistance needed from RT at this time. ?
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
## 2019-07-02 NOTE — Progress Notes (Signed)
Physical Therapy Session Note  Patient Details  Name: CHRISTY ARQUILLA MRN: [id number] Date of Birth: [birth year]  Today's Date: 07/02/2019 PT Individual Time: 0902-0959 PT Individual Time Calculation (min): 57 min    Short Term Goals: Week 1:  PT Short Term Goal 1 (Week 1): Pt will perform supine<>sit with CGA PT Short Term Goal 2 (Week 1): Pt will perform sit<>stands using LRAD with CGA PT Short Term Goal 3 (Week 1): Pt will perform stand pivot transfers using LRAD with min assist PT Short Term Goal 4 (Week 1): Pt will ambulate at least 69ft using LRAD with min assist  Skilled Therapeutic Interventions/Progress Updates:    Pt supine in bed upon PT arrival, agreeable to therapy tx and denies pain. Pt transferred to sitting with CGA and cues for techniques, use of bedrails. In sitting pt with occasional posterior lean, requiring cues to correct. Pt noted to be incontinent of bladder, sit<>stand with RW and min assist to change briefs and don shorts. While standing pt reports needing to use bathroom again, able to maintain standing balance CGA with RW while therapist assisted with use of urinal, continent of bladder. Therapist providing total assist for clothing management. Ambulation to w/c x 5 ft with RW and min assist, transported to the gym. Pt ambulated x 40 ft this session with RW and min assist, pt with improved step length/gait speed noted today compared to yesterday. Pt worked on standing balance and foot clearance to performed 2 x 10 toe taps on aerobic step with UE support on RW, CGA, occasional buckling of L LE noted during this with pt reporting he feels like knee might give out. Pt seated edge of mat performed x 10 L LE LAQ, therapist performed 2 x 30 sec L hamstring stretching. Pt ambulated to the parallel bars with RW and min assist, within the parallel bars pt worked on sidestepping for balance and hip abductor strengthening while facing mirror with B UE support on bar, 2 x 8 ft in each  direction with cues for increased step length. Pt ambulated back to mat with min assist, increased cues for step length and foot placement with turn to sit. Pt seated edge of mat performed 2 x 10 hip abduction with orange theraband for strengthening. Stand pivot to w/c with min assist and RW. Pt  ascended/descended 6 steps x 2  (3 inch steps) with B rails and min assist, cues for reciprocal pattern to work on strengthening and step length. Pt transported back to room and left in w/c at end of session, needs in reach and chair alarm set.   Therapy Documentation Precautions:  Precautions Precautions: Fall, Other (comment) Precaution Comments: posterior and R lateral lean and h/o falls; HOH (using amplifier) Restrictions Weight Bearing Restrictions: No    Therapy/Group: Individual Therapy  Netta Corrigan, PT, DPT, CSRS 07/02/2019, 7:40 AM

## 2019-07-02 NOTE — Progress Notes (Signed)
HCPAP in use on room air.  Saturation 96%

## 2019-07-02 NOTE — Progress Notes (Signed)
Social Work Patient ID: Justin [name], male   DOB: Jun 29, 1940, 79 y.o.   MRN: [id number]  SW spoke with spouse inside room in reference to discharge plans. Patient and her goal is to discharge home. Patient would not return home only if not physically or medically able.

## 2019-07-02 NOTE — Progress Notes (Signed)
Occupational Therapy Session Note  Patient Details  Name: Justin [name] MRN: [id number] Date of Birth: 03-19-1940  Today's Date: 07/02/2019 OT Individual Time: [id number] OT Individual Time Calculation (min): 55 min    Short Term Goals: Week 1:  OT Short Term Goal 1 (Week 1): Pt will complete LB bathing sit to stand with supervision. OT Short Term Goal 2 (Week 1): Pt will complete LB dressing sit to stand with supervision. OT Short Term Goal 3 (Week 1): Pt will complete toilet transfers with use of the RW and supervision.  Skilled Therapeutic Interventions/Progress Updates:    Session 1 [phone]) Pt worked on showering, dressing, and grooming during session.  Min assist was needed for ambulation to the walk-in shower with use of the RW for support.  Noted short step length throughout with mod instructional cueing for taking bigger steps.  He was able to use the shower bench in the shower and complete all bathing sit to stand with min guard assist and min instructional cueing for thoroughness.  He transferred out to the wheelchair for dressing with min assist using the RW after completion of drying off.  He was able to complete all dressing with supervision for UB and mod assist for LB.  He demonstrated decreased ability to donn his gripper socks, so therapist assist was needed.  Otherwise he was able to donn his brief and his shorts with min assist.  He finished with completion of oral hygiene with supervision from the wheelchair.  Pt left with call button and phone in reach with chair alarm in place.    Session 2: [phone])  Pt was transported down to the ADL apartment via wheelchair to begin session.  He then completed simulated walk-in shower transfer with use of the RW, simulated grab bar, and shower seat.  He needed only min assist to complete transfer in and out of the shower this session.  He was then transferred to the therapy gym where he worked on standing balance with use of the  [employer].  He averaged 2.5-3 seconds between each light during 3 intervals.  He needed min assist for stepping up on the foam surface as well as for standing balance secondary to LOB posteriorly.   He could maintain his standing balance with the RW on a solid surface with min guard assist.  Transitioned over to the therapy mat with min assist where he worked on standing and stepping toward placed targets on the floor.  Emphasis on taking larger steps with the RLE while completing more efficient weightshift to the left.  He needed mod assist to maintain upright posture while completing larger steps.  Also had him work on stepping on and off of a 4" step with each LE to work on [place].  He completed functional mobility without and assistive device and mod assist for approximately 30'.  He then completed functional mobility with the RW as well with min assist.  In both instances he continues to demonstrate short step length on the right with decreased weightshift to the left.  Finished session with transfer back to the room via wheelchair and transfer from the wheelchair to the bed with min assist.  Pt left with spouse present and call button and phone in reach.     Therapy Documentation Precautions:  Precautions Precautions: Fall, Other (comment) Precaution Comments: posterior and R lateral lean and h/o falls; HOH (using amplifier) Restrictions Weight Bearing Restrictions: No   Pain: Pain Assessment Pain Scale: Faces Pain Score: 0-No  pain ADL: See Care Tool Section for some details of mobility and selfcare  Therapy/Group: Individual Therapy  Ike Maragh OTR/L 07/02/2019, 11:59 AM

## 2019-07-03 ENCOUNTER — Inpatient Hospital Stay (HOSPITAL_COMMUNITY): Payer: Medicare HMO | Admitting: Physical Therapy

## 2019-07-03 ENCOUNTER — Inpatient Hospital Stay (HOSPITAL_COMMUNITY): Payer: Medicare HMO | Admitting: Speech Pathology

## 2019-07-03 ENCOUNTER — Inpatient Hospital Stay (HOSPITAL_COMMUNITY): Payer: Medicare HMO | Admitting: Occupational Therapy

## 2019-07-03 LAB — GLUCOSE, CAPILLARY
Glucose-Capillary: 92 mg/dL (ref 70–99)
Glucose-Capillary: 120 mg/dL — ABNORMAL HIGH (ref 70–99)
Glucose-Capillary: 78 mg/dL (ref 70–99)
Glucose-Capillary: 81 mg/dL (ref 70–99)

## 2019-07-03 NOTE — Progress Notes (Signed)
Patient on home CPAP and resting.

## 2019-07-03 NOTE — Progress Notes (Signed)
Physical Therapy Session Note  Patient Details  Name: Justin [name] MRN: [id number] Date of Birth: 02/05/1941  Today's Date: 07/03/2019 PT Individual Time: [id number] PT Individual Time Calculation (min): 74 min   Short Term Goals: Week 1:  PT Short Term Goal 1 (Week 1): Pt will perform supine<>sit with CGA PT Short Term Goal 2 (Week 1): Pt will perform sit<>stands using LRAD with CGA PT Short Term Goal 3 (Week 1): Pt will perform stand pivot transfers using LRAD with min assist PT Short Term Goal 4 (Week 1): Pt will ambulate at least 15ft using LRAD with min assist  Skilled Therapeutic Interventions/Progress Updates:   Pt received supine in bed and agreeable to therapy session. Supine>sitting L EOB with CGA/close supervision and pt requiring increased time to scoot hips to EOB with additional time for problem solving how to perform task. Sitting EOB donned shorts with supervision for sitting balance. Sit<>stands using RW with CGA for steadying and cuing for UE placement for safety with AD. Pulled pants over hips with CGA for steadying. Stand pivot to w/c using RW with CGA for steadying. Donned shoes total assist for time management.  Transported to/from gym in w/c for time management and energy conservation.   Performed the following gait training trials using RW with min assist for balance:  - ~74ft x2 with pt demonstrating improving gait pattern with reciprocal pattern though with fatigue continues to have decreased BLE (R more impaired than L) step length in festinating and freezing pattern  - ~38ft with 3lb weights donned to B LEs to promote increased attention and focus on increasing B LE step length and foot clearance due to increased resistance - ~12ft with theraband tied to middle of RW to provide external target to reach LEs when stepping for increased step length but this was not effective - ~72ft with theraband moved down on RW near the wheels to promote increased step length with pt  demonstrating some improvement but when becoming distracted by things in environment begins the festinating stepping pattern with decreased B LE step length (R more impaired than L) and decreased foot clearance with pt stopping on toes in initial contact this also occurs when pt gets close to chair/mat   Repeated sit<>stands, no UE support, x10reps with cuing for anterior weight shift and to avoid pushing backs of knees into mat with CGA for steadying. R LE NMR standing without UE support attempted R LE heel taps on 6" steps but pt does not feel safe to perform this without UE support therefore transitioned to having B UE support on RW - external targets used for increased step length - still wearing 3lb ankle weight to increase work of effort. Block practice stand pivot transfers w/c<>EOM using RW x4 with cuing for hand placements and CGA for steadying - cuing to complete transfers in 4 steps (as he averaged ~8steps due to short shuffling steps) with pt demonstrating most difficulty transferring to the R due to poor R lateral step length. Lateral side stepping in // bars down/back with B UE support on bars and CGA for steadying using agility latter for increased step length - demonstrates rotation compensation when stepping R to use hip flexors rather than abductors, cuing to improve. Transported back to room and left seated in w/c with needs in reach and seat belt alarm on.   Therapy Documentation Precautions:  Precautions Precautions: Fall, Other (comment) Precaution Comments: posterior and R lateral lean and h/o falls; HOH (using amplifier) Restrictions Weight Bearing  Restrictions: No  Pain:  Denies pain during session.   Therapy/Group: Individual Therapy  Tawana Scale, PT, DPT 07/03/2019, 7:39 AM

## 2019-07-03 NOTE — Progress Notes (Signed)
Speech Language Pathology Daily Session Note  Patient Details  Name: Justin [name] MRN: [id number] Date of Birth: Mar 11, 1940  Today's Date: 07/03/2019 SLP Individual Time: [id number] SLP Individual Time Calculation (min): 40 min  Short Term Goals: Week 1: SLP Short Term Goal 1 (Week 1): Pt will demonstrate sustained attention during functional tasks in 10 minute intervals with min A verbal cues for redirection. SLP Short Term Goal 2 (Week 1): Pt will complete basic problem solving tasks with min A verbal cues. SLP Short Term Goal 3 (Week 1): Pt will utilize external aids to recall daily and novel information with min A verbal cues. SLP Short Term Goal 4 (Week 1): Pt will demonstrated intellectual awareness by identiftying 2 cognitive and 2 physical impairments due to acute CVA and their impact on daily function with min A verbal cues.  Skilled Therapeutic Interventions: Pt was seen for skilled ST targeting cognition. Min A visual cues provided for orientation to time via use of calendar aid. Provided extra time and Min A verbal and visual cues for problem solving and error awareness, pt completed a basic 3-step action card sequencing task. In functional conversation related to transfers, he verbally sequenced steps of a stand pivot transfer from chair to bed with more Moderate verbal/quetsion cues regarding safety awareness and mobility techniques. Pt left sitting in chair with alarm set and needs within reach. Continue per current plan of care.          Pain Pain Assessment Pain Scale: 0-10 Pain Score: 0-No pain  Therapy/Group: Individual Therapy  Arbutus Leas 07/03/2019, 7:19 AM

## 2019-07-03 NOTE — Progress Notes (Signed)
Occupational Therapy Session Note  Patient Details  Name: Justin [name] MRN: [id number] Date of Birth: 1940/07/11  Today's Date: 07/03/2019 OT Individual Time: 1415-1533 OT Individual Time Calculation (min): 78 min    Short Term Goals: Week 1:  OT Short Term Goal 1 (Week 1): Pt will complete LB bathing sit to stand with supervision. OT Short Term Goal 2 (Week 1): Pt will complete LB dressing sit to stand with supervision. OT Short Term Goal 3 (Week 1): Pt will complete toilet transfers with use of the RW and supervision.  Skilled Therapeutic Interventions/Progress Updates:    Pt completed supine to sit EOB with min assist to start session.  He then worked on donning his shoes with mod assist and use of the [place] shoe horn as well as the shoe funnel.  He was able to then transfer stand pivot to the wheelchair with min assist using the RW for support.  Therapist took him down to the dayroom where he transferred over to the N[place] with min assist.  He was able to complete 2 intervals of 6 mins and 5 mins on level 5 resistance.  He needed mod instructional cueing to maintain step length as he would get distracted by other people in the room and then stop working on the machine.  His average number of steps were around 35 for each set.  Next, he was taken back to the room secondary to having a strong odor of urine coming from his pants.  Pt with reports of bladder incontinence when asked so toilet hygiene and clothing management was completed sit to stand at the sink.  He was able to remove his soiled brief and shorts with min assist and then complete peri care in standing at the same level.  He needed mod assist for donning new brief and shorts.  Nursing made aware of strong odor from his urine as well.  Finished session with work on static standing balance in front of the mirror with min guard assist.  Pt demonstrates increased trunk and knee flexion in standing statically as well as with functional  mobility.  Had him work on alternating lifting each LE from the floor and holding it, while supporting himself with BUEs.  Attempted to have him support with just one UE and complete this as well but he was unable to without mod assist from therapist.  Completed short distance functional mobility to the hallway and back with use of the RW for support.  Pt demonstrated short step length on the right, with increased lean to the right through his trunk as well and decreased left weightshift.  Pt completed transfer to the bed with min assist as well as transition to supine.  Call button and phone in reach with safety alarm in place.     Therapy Documentation Precautions:  Precautions Precautions: Fall, Other (comment) Precaution Comments: posterior and R lateral lean and h/o falls; HOH (using amplifier) Restrictions Weight Bearing Restrictions: No  Pain: Pain Assessment Pain Scale: Faces Pain Score: 0-No pain ADL: See Care Tool Section for some details of mobility and selfcare  Therapy/Group: Individual Therapy  Fransisco Messmer OTR/L 07/03/2019, 4:56 PM
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
## 2019-07-04 ENCOUNTER — Inpatient Hospital Stay (HOSPITAL_COMMUNITY): Payer: Medicare HMO

## 2019-07-04 ENCOUNTER — Inpatient Hospital Stay (HOSPITAL_COMMUNITY): Payer: Medicare HMO | Admitting: Occupational Therapy

## 2019-07-04 ENCOUNTER — Inpatient Hospital Stay (HOSPITAL_COMMUNITY): Payer: Medicare HMO | Admitting: Speech Pathology

## 2019-07-04 LAB — GLUCOSE, CAPILLARY
Glucose-Capillary: 99 mg/dL (ref 70–99)
Glucose-Capillary: 106 mg/dL — ABNORMAL HIGH (ref 70–99)
Glucose-Capillary: 103 mg/dL — ABNORMAL HIGH (ref 70–99)
Glucose-Capillary: 147 mg/dL — ABNORMAL HIGH (ref 70–99)

## 2019-07-04 NOTE — Patient Care Conference (Signed)
Inpatient RehabilitationTeam Conference and Plan of Care Update Date: 07/04/2019   Time: 3:54 PM    Patient Name: Justin [name]      Medical Record Number: [id number]  Date of Birth: 1940/11/02 Sex: Male         Room/Bed: 4W11C/4W11C-01 Payor Info: Payor: [employer] MEDICARE / Plan: [place] HMO / Product Type: *No Product type* /    Admit Date/Time:  06/28/2019  4:47 PM  Primary Diagnosis:  Acute right MCA stroke [facility])  Patient Active Problem List   Diagnosis Date Noted  . Major depressive disorder, recurrent episode, moderate ([place])   . Gait disorder   . Acute right MCA stroke ([place]) 06/28/2019  . Ambulatory dysfunction   . Stroke ([place]) 06/24/2019  . Type 2 diabetes mellitus ([place]) 10/04/2018  . Depression 10/04/2018  . Acute CVA (cerebrovascular accident) ([place]) 09/18/2017  . Stroke (cerebrum) ([place]) 09/17/2017  . Diabetes mellitus without complication ([place]) [id number]  . Closed head injury 04/22/2013  . HLD (hyperlipidemia) 07/29/2009  . Essential hypertension 07/29/2009  . EFFUSION, PLEURAL 07/29/2009  . DIVERTICULITIS, COLON 07/29/2009  . 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 COLONIC POLYPS, HYPERPLASTIC, HX OF 07/29/2009    Expected Discharge Date: Expected Discharge Date: 07/13/19  Team Members Present: Physician leading conference: Dr. Alysia Penna Care Coodinator Present: Nestor Lewandowsky, RN, BSN, CRRN;Christina Sampson Goon, [place] Nurse Present: Other (comment)(Kristy Lovena Le, LPN) PT Present: Michaelene Song, PT OT Present: Clyda Greener, OT SLP Present: Jettie Booze, CF-SLP PPS Coordinator present : Ileana Ladd, Burna Mortimer, SLP     Current Status/Progress Goal Weekly Team Focus  Bowel/Bladder   Patient is continent of bowel and bladder with occasional incontinent episodes (adult brief)  Patient will maintain continence and verbalizes toileting needs  Assess Qshift and PRN with Q2-3H toileting. Encouraged balanced nutritional intake   Swallow/Nutrition/  Hydration             ADL's   supervision for UB selfcare with min to mod assist for LB selfcare.  min assist for transfers with use of the RW for support.  supervision overall  selfcare retraining, balance retraining, transfer training, balance retraining, DME education   Mobility   min assist for bed mobility, sit<>stand and stand pivot transfers using RW, and ambulation up to 28ft using RW; CGA 6 (3"height) steps using HRs  CGA overall at ambulatory level  activity tolerance, pt education, B LE strengthening, dynamic standing balance, gait training, processing/initiation/motor planning of mobility tasks   Communication             Safety/Cognition/ Behavioral Observations  Min-Mod  Supervision  intellectual awareness, task initiation, recall, auditory comprehension, sustained attention   Pain   Patient denies any pain or discomfort  Patient will remain pain free with and without activity  Assess pain level Qshift and PRN   Skin   Patient's skin is intact with dry patches  Patient will maintain skin integrity  Assess skin Qshift and PRN.  Encourage skin care with attention to strict pericare following toileting episodes. Barrier cream PRN.    Rehab Goals Patient on target to meet rehab goals: Yes Rehab Goals Revised: On target with goals *See Care Plan and progress notes for long and short-term goals.     Barriers to Discharge  Current Status/Progress Possible Resolutions Date Resolved   Nursing      Incontinence      Timed Toileting       PT          Abnormal gait/kknee pain  OT        Right lean/abnormal gait          SLP     Poor auditory carry over    Cues for initiation       SW Medical stability SW aware of none currently.            Discharge Planning/Teaching Needs:  Patient and spouse plan to discharge patient home  Will schedule if recommended   Team Discussion:  Minimal assistance overall with right lean/short length of step (Parkinsonian like gait),  right knee pain. 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 Possible Resolutions to Barriers: Cont rehab will likely progress slowly   Continued Need for Acute Rehabilitation Level of Care: The patient requires daily medical management by a physician with specialized training in physical medicine and rehabilitation for the following reasons: Direction of a multidisciplinary physical rehabilitation program to maximize functional independence : Yes Medical management of patient stability for increased activity during participation in an intensive rehabilitation regime.: Yes Analysis of laboratory values and/or radiology reports with any subsequent need for medication adjustment and/or medical intervention. : Yes   I attest that I was present, lead the team conference, and concur with the assessment and plan of the team.   Dorien Chihuahua B 07/04/2019, 3:54 PM

## 2019-07-04 NOTE — Progress Notes (Signed)
Home CPAP in use 

## 2019-07-04 NOTE — Progress Notes (Signed)
Physical Therapy Session Note  Patient Details  Name: Justin [name] MRN: [id number] Date of Birth: 10-May-1940  Today's Date: 07/04/2019 PT Individual Time: 1602-1702 PT Individual Time Calculation (min): 60 min    Short Term Goals: Week 1:  PT Short Term Goal 1 (Week 1): Pt will perform supine<>sit with CGA PT Short Term Goal 2 (Week 1): Pt will perform sit<>stands using LRAD with CGA PT Short Term Goal 3 (Week 1): Pt will perform stand pivot transfers using LRAD with min assist PT Short Term Goal 4 (Week 1): Pt will ambulate at least 81ft using LRAD with min assist  Skilled Therapeutic Interventions/Progress Updates:    Pt seated in w/c upon PT arrival, agreeable to therapy tx and denies pain. [name] present during this session in order to discuss baseline mobility. Pt transported to the gym in w/c and ambulated x 40 ft with RW and min assist, cues for R step length. Wife reports that patient had been demonstrating shuffling gait pattern since last stroke, Aug 2020 but he is catching his R LE more now than before. Pt participated in treadmill training this session with use of litegait harness system and partial body weight support, min assist to step on/off treadmill and min for standing balance while donning harness. Gait training as follows with multimodal cueing a facilitation for increased step length/upright posture: Trial 1- 0.4 mph, 52 ft, 1:15 min Trial 2-  0.3-0.4 mph, x104 ft, 3:56 min Trial 3- 0.4 mph, x127 ft, 3:38 min Pt participated in overground gait training following with use of RW and min assist (poor carry over of increased step length noted) - ambulated 2 x 60 ft with continued cues for step length. Pt transported back to room in w/c, noted to have soiled brief. Pt maintained standing balance with CGA while therapist performed clothing management and changed brief, while standing therapist and wife encouraged pt to try using urinal, pt able to void. Pt left in care of  wife, seated in w/c with needs in reach and chair alarm set.    Therapy Documentation Precautions:  Precautions Precautions: Fall, Other (comment) Precaution Comments: posterior and R lateral lean and h/o falls; HOH (using amplifier) Restrictions Weight Bearing Restrictions: No    Therapy/Group: Individual Therapy  Netta Corrigan, PT, DPT, CSRS 07/04/2019, 12:56 PM

## 2019-07-04 NOTE — Progress Notes (Addendum)
Occupational Therapy Session Note  Patient Details  Name: Justin [name] MRN: [id number] Date of Birth: October 04, 1940  Today's Date: 07/04/2019 OT Individual Time: 11:15-12:08  (53 mins)    Short Term Goals: Week 1:  OT Short Term Goal 1 (Week 1): Pt will complete LB bathing sit to stand with supervision. OT Short Term Goal 2 (Week 1): Pt will complete LB dressing sit to stand with supervision. OT Short Term Goal 3 (Week 1): Pt will complete toilet transfers with use of the RW and supervision.  Skilled Therapeutic Interventions/Progress Updates:    Pt completed shower and dressing during session.  He was able to ambulate to the shower with use of the RW and min assist to start session.  Max instructional cueing to take larger steps, especially on the right.  Once in the shower, he was able to undress with min assist sit to stand and then complete all bathing at min assist level as well and min instructional cueing for thoroughness.  He transferred out to the wheelchair with min assist as well with completion of shaving at min assist level and supervision for combing his hair.  He was able to the complete UB dressing with supervision and then completed LB dressing with min assist.  He did need only min assist for donning his socks on the right foot and then donned his shoes and tied them with supervision.  Finished session with pt in the wheelchair with call button and phone in reach with safety belt in place.    Therapy Documentation Precautions:  Precautions Precautions: Fall, Other (comment) Precaution Comments: posterior and R lateral lean and h/o falls; HOH (using amplifier) Restrictions Weight Bearing Restrictions: No  Pain: Pain Assessment Pain Scale: Faces Pain Score: 0-No pain ADL: See Care Tool Section for some details of mobility and selfcare  Therapy/Group: Individual Therapy  Lorraina Spring OTR/L 07/04/2019, 11:59 AM

## 2019-07-04 NOTE — Progress Notes (Signed)
Speech Language Pathology Daily Session Note  Patient Details  Name: DEMONTE WRIGHT MRN: [id number] Date of Birth: 18-Jun-1940  Today's Date: 07/04/2019 SLP Individual Time: 1415-1445 SLP Individual Time Calculation (min): 30 min  Short Term Goals: Week 1: SLP Short Term Goal 1 (Week 1): Pt will demonstrate sustained attention during functional tasks in 10 minute intervals with min A verbal cues for redirection. SLP Short Term Goal 2 (Week 1): Pt will complete basic problem solving tasks with min A verbal cues. SLP Short Term Goal 3 (Week 1): Pt will utilize external aids to recall daily and novel information with min A verbal cues. SLP Short Term Goal 4 (Week 1): Pt will demonstrated intellectual awareness by identiftying 2 cognitive and 2 physical impairments due to acute CVA and their impact on daily function with min A verbal cues.  Skilled Therapeutic Interventions: Skilled treatment session focused on cognitive goals. SLP facilitated session by providing Min A verbal cues and extra time for functional problem solving during a basic money management task. Min A verbal cues were also needed for selective attention to task in a mildly distracting environment for ~10 minutes. Patient left upright in the wheelchair with alarm on and all needs within reach. Continue with current plan of care.      Pain Pain Assessment Pain Scale: Faces Pain Score: 0-No pain  Therapy/Group: Individual Therapy  Earnestene Angello 07/04/2019, 3:53 PM
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
## 2019-07-04 NOTE — Progress Notes (Signed)
Occupational Therapy Session Note  Patient Details  Name: Justin [name] MRN: [id number] Date of Birth: [birth year]  Today's Date: 07/04/2019 OT Individual Time: [id number] OT Individual Time Calculation (min): 33 min    Short Term Goals: Week 1:  OT Short Term Goal 1 (Week 1): Pt will complete LB bathing sit to stand with supervision. OT Short Term Goal 2 (Week 1): Pt will complete LB dressing sit to stand with supervision. OT Short Term Goal 3 (Week 1): Pt will complete toilet transfers with use of the RW and supervision.  Skilled Therapeutic Interventions/Progress Updates:    Pt worked on standing balance while engaged in [employer].  Mod assist was needed for static balance without UE support.  He exhibited increased posterior lean and LOB with decreased ability to achieve and complete forward weightshift over his forefoot.  Had him work on taking a large step forward with the RLE with each toss of the bean bag with the RUE.  Mod assist was needed for maintaining balance as well as for adequate weightshift to the left in order to step back with the RLE.  He ambulated with hand held assist at [facility] assist level to pick up the bean bags after tossing them.  This was completed for three intervals with rest breaks in-between.  Finished session with transfer back to the room via wheelchair and pt left sitting up with his spouse present.  Call button and phone in reach with safety belt in place at end of session.    Therapy Documentation Precautions:  Precautions Precautions: Fall, Other (comment) Precaution Comments: posterior and R lateral lean and h/o falls; HOH (using amplifier) Restrictions Weight Bearing Restrictions: No  Pain: Pain Assessment Pain Scale: Faces Pain Score: 0-No pain ADL: See Care Tool Section for some details of mobility and selfcare  Therapy/Group: Individual Therapy  Mea Ozga OTR/L 07/04/2019, 4:20 PM

## 2019-07-04 NOTE — Progress Notes (Signed)
Team Conference Report to Patient/Family  Team Conference discussion was reviewed with the patient and caregiver, including goals, any changes in plan of care and target discharge date.  Patient and caregiver express understanding and are in agreement.  The patient has a target discharge date of  07/13/19.  Dyanne Iha 07/04/2019, 3:07 PM

## 2019-07-05 ENCOUNTER — Inpatient Hospital Stay (HOSPITAL_COMMUNITY): Payer: Medicare HMO | Admitting: Occupational Therapy

## 2019-07-05 ENCOUNTER — Inpatient Hospital Stay (HOSPITAL_COMMUNITY): Payer: Medicare HMO | Admitting: Physical Therapy

## 2019-07-05 LAB — GLUCOSE, CAPILLARY
Glucose-Capillary: 88 mg/dL (ref 70–99)
Glucose-Capillary: 103 mg/dL — ABNORMAL HIGH (ref 70–99)
Glucose-Capillary: 124 mg/dL — ABNORMAL HIGH (ref 70–99)
Glucose-Capillary: 138 mg/dL — ABNORMAL HIGH (ref 70–99)

## 2019-07-05 NOTE — Progress Notes (Signed)
Speech Language Pathology Weekly Progress and Session Note  Patient Details  Name: Justin [name] MRN: [id number] Date of Birth: 06-15-1940  Beginning of progress report period: Jun 28, 2019 End of progress report period: Jul 05, 2019  Short Term Goals: Week 1: SLP Short Term Goal 1 (Week 1): Pt will demonstrate sustained attention during functional tasks in 10 minute intervals with min A verbal cues for redirection. SLP Short Term Goal 1 - Progress (Week 1): Met SLP Short Term Goal 2 (Week 1): Pt will complete basic problem solving tasks with min A verbal cues. SLP Short Term Goal 2 - Progress (Week 1): Met SLP Short Term Goal 3 (Week 1): Pt will utilize external aids to recall daily and novel information with min A verbal cues. SLP Short Term Goal 3 - Progress (Week 1): Met SLP Short Term Goal 4 (Week 1): Pt will demonstrated intellectual awareness by identiftying 2 cognitive and 2 physical impairments due to acute CVA and their impact on daily function with min A verbal cues. SLP Short Term Goal 4 - Progress (Week 1): Met    New Short Term Goals: Week 2: SLP Short Term Goal 1 (Week 2): STGs=LTGs due to ELOS  Weekly Progress Updates: Patient has made functional gains and has met 4 of 4 STGs this reporting period. Currently, patient requires overall Min A verbal cues to complete functional and familiar tasks safely regarding problem solving, attention, recall and awareness. Patient and family education is ongoing. Patient would benefit from continued skilled SLP intervention to maximize his cognitive functioning prior to discharge.      Intensity: Minumum of 1-2 x/day, 30 to 90 minutes Frequency: 1 to 3 out of 7 days Duration/Length of Stay: 07/12/19 Treatment/Interventions: Cognitive remediation/compensation;Cueing hierarchy;Internal/external aids;Functional tasks;Patient/family education;Therapeutic Activities;Environmental controls   [place], [place] 07/05/2019, 6:19  AM
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
## 2019-07-05 NOTE — Progress Notes (Signed)
Physical Therapy Session Note  Patient Details  Name: Justin [name] MRN: [id number] Date of Birth: Nov 24, 1940  Today's Date: 07/05/2019 PT Individual Time: [id number] and [id number] PT Individual Time Calculation (min): 73 min and 30 min   Short Term Goals: Week 1:  PT Short Term Goal 1 (Week 1): Pt will perform supine<>sit with CGA PT Short Term Goal 2 (Week 1): Pt will perform sit<>stands using LRAD with CGA PT Short Term Goal 3 (Week 1): Pt will perform stand pivot transfers using LRAD with min assist PT Short Term Goal 4 (Week 1): Pt will ambulate at least 37ft using LRAD with min assist  Skilled Therapeutic Interventions/Progress Updates:    Session 1: Pt received supine in bed and agreeable to therapy session. Supine>sitting L EOB, flat bed, with min assist for trunk upright. Sitting EOB donned shoes max assist for time management. Sit>stand EOB>RW with min assist for lifting/balance from low surface - cuing for increase anterior trunk lean. Gait ~19ft to bathroom using RW with CGA for steadying - continues to have decreased R LE step length with shuffling gait pattern, cuing to improve. Standing using grab bar with CGA for steadying and min assist for LB clothing management with pt attempting to urinate in standing but decided to turn and sit on BSC over toilet with CGA for steadying. Pt had been incontinent of bladder in brief - unable to void more on toilet. Standing using grab bar support as needed and CGA for steadying performed peri-care with set-up assist and LB clothing management with min assist. Gait ~31ft to sink using RW with CGA/min assist for balance stepping down bathroom door threshold and max cuing for proper AD management at sink - standing hand hygiene.  Transported to/from gym in w/c for time management and energy conservation. Sit<>stands using RW with CGA for steadying during session - continues to require cuing for UE placement.   Performed the following gait training  trials using RW with CGA/min assist for balance with interventions targeting increased R LE step length: - ~48ft wearing 7lb weight on R LE and blue theraband on RW for visual target of stepping - continues to have festinating/shuffled gait with decreased R LE step length despite cuing and manual facilitation for improvement - removed weight and ambulated 22ft back to w/c using RW with pt demonstrating slight improvement in gait but unable to sustain improvements when distracted by environment.  - ~13ft x2 down/back using agility ladder at beginning and end of gait to promote increaesed B LE step length and continuing to ambulate for carryover without agility ladder but poor carryover and pt distracted by environment; continued to use theraband on front of RW as visual target for increased step lengths - ~39ft x2 down/back through agility ladder targeting increased B LE step length but RW would occasionally get snagged on ladder interrupting pt's rhythm - ~77ft x2 down/back through course of external targets to increase step length with pt continuing to demonstrate decreased R LE step length compared to L  Stand pivot w/c>Nustep using RW with CGA for steadying - cuing for increased step length during transfer as this often causes significant shuffling/festinating/freezing. Performed reciprocal B LE movement patterns on Nustep against level 3 resistance for 2 minutes totaling 64steps - requires continuous max multimodal cuing throughout for reciprocal pattern of LEs. MD in/out for morning assessment. Gait training ~19ft using RW to determine carryover of reciprocal pattern from Nustep with slight improvement noted that began to decline with turning or getting close  to w/c. Transported back to room and pt left seated in w/c with needs in reach and seat belt alarm on.   Session 2:  Pt received supine in bed and agreeable to therapy session. Supine>sitting L EOB with min assist for trunk upright and pt relying  on use of bedrails. Pt reports need to urinate. Sitting EOM donned shoes max assist for time management. Sit>stand EOB>RW with min assist to facilitate anterior trunk lean coming to stand. Gait ~50ft to bathroom using RW with min assist for balance and forward movement of AD due to continued shuffling gait with more impaired R LE step length - cuing for improvement. Pt requests to stand to urinate denying need for BM. Standing with CGA for steadying performed LB clothing management with min assist. Suddenly patient starts having BM - transitioned to sitting on toilet with min assist. Therapist provided max assist to doff soiled clothing to ensure cleanliness. Pt unable to void further on toilet and was incontinent of bladder in brief prior to attempting to void at toilet. Standing using grab bar and RW with CGA for steadying performed total assist posterior peri-care for cleanliness and set-up assist anterior peri-care. Gait training ~27ft to sink using RW with min assist for balance and max cuing for sequencing of AD over door threshold and pt demonstrating some recall of proper AD management at sink. Standing hand hygiene with CGA for steadying. Gait training ~152ft using RW with CGA/min assist for balance and continued cuing for increased B LE (R>L) step lengths using visual cue of theraband on front of RW. Pt hearing music in hallway and reports he enjoys it therefore therapist provided song with rhythmic beat to provide auditory cuing for reciprocal continuous stepping; however, pt becoming distracted by items in environment. Pt left seated in w/c with needs in reach, seat belt alarm on, and meal tray set-up.  Therapy Documentation Precautions:  Precautions Precautions: Fall, Other (comment) Precaution Comments: posterior and R lateral lean and h/o falls; HOH (using amplifier) Restrictions Weight Bearing Restrictions: No  Pain:   Session 1: No reports of pain throughout session.  Session 2: No  reports of pain throughout session.   Therapy/Group: Individual Therapy  Tawana Scale, PT, DPT 07/05/2019, 7:55 AM

## 2019-07-06 ENCOUNTER — Inpatient Hospital Stay (HOSPITAL_COMMUNITY): Payer: Medicare HMO | Admitting: Physical Therapy

## 2019-07-06 ENCOUNTER — Inpatient Hospital Stay (HOSPITAL_COMMUNITY): Payer: Medicare HMO | Admitting: Speech Pathology

## 2019-07-06 ENCOUNTER — Inpatient Hospital Stay (HOSPITAL_COMMUNITY): Payer: Medicare HMO | Admitting: Occupational Therapy

## 2019-07-06 LAB — GLUCOSE, CAPILLARY
Glucose-Capillary: 118 mg/dL — ABNORMAL HIGH (ref 70–99)
Glucose-Capillary: 120 mg/dL — ABNORMAL HIGH (ref 70–99)
Glucose-Capillary: 142 mg/dL — ABNORMAL HIGH (ref 70–99)
Glucose-Capillary: 117 mg/dL — ABNORMAL HIGH (ref 70–99)

## 2019-07-06 NOTE — Progress Notes (Signed)
Placed patient on home CPAP for the night with pressure set at 11cm.

## 2019-07-06 NOTE — Progress Notes (Signed)
Physical Therapy Weekly Progress Note  Patient Details  Name: Justin [name] MRN: [id number] Date of Birth: 01/04/1941  Beginning of progress report period: Jun 29, 2019 End of progress report period: Jul 06, 2019  Today's Date: 07/06/2019 PT Individual Time: 1126-1208  And [id number] PT Individual Time Calculation (min): 42 min and 74 min  Patient has met 4 of 4 short term goals.  Justin [name] is progressing well with therapy demonstrating increasing independence with functional mobility. He continues to have impaired processing, delayed initiation, impaired awareness, impaired memory, impaired motor planning, and shuffled/freezing gait pattern. He is performing supine<>sit with min assist, sit<>stands and stand pivots using RW with CGA/min assist, ambulating up to 169f using RW with min assist, and ascending/descending stairs using B HRs with min assist. He exhibits shuffled, freezing gait pattern with decreased R LE foot clearance and step length more impaired than L LE - performed a variety of visual target and multimodal cuing approaches to improve gait pattern but pt unable to perform consistently due to impaired attention to task and impaired motor planning.  Patient continues to demonstrate the following deficits muscle weakness and muscle joint tightness, decreased cardiorespiratoy endurance, impaired timing and sequencing, abnormal tone, unbalanced muscle activation, motor apraxia and decreased motor planning,  , decreased initiation, decreased attention, decreased awareness, decreased problem solving, decreased safety awareness, decreased memory and delayed processing and decreased standing balance, decreased postural control and decreased balance strategies and therefore will continue to benefit from skilled PT intervention to increase functional independence with mobility.  Patient progressing toward long term goals..  Continue plan of care.  PT Short Term Goals Week 1:  PT Short Term Goal  1 (Week 1): Pt will perform supine<>sit with CGA PT Short Term Goal 1 - Progress (Week 1): Met PT Short Term Goal 2 (Week 1): Pt will perform sit<>stands using LRAD with CGA PT Short Term Goal 2 - Progress (Week 1): Met PT Short Term Goal 3 (Week 1): Pt will perform stand pivot transfers using LRAD with min assist PT Short Term Goal 3 - Progress (Week 1): Met PT Short Term Goal 4 (Week 1): Pt will ambulate at least 523fusing LRAD with min assist PT Short Term Goal 4 - Progress (Week 1): Met Week 2:  PT Short Term Goal 1 (Week 2): = to LTGs based on ELOS  Skilled Therapeutic Interventions/Progress Updates:    Session 1: Pt received sitting in w/c and agreeable to therapy session.  Transported to/from gym in w/c for time management and energy conservation. Stand pivot w/c<>Nustep using RW with CGA for steadying. B LE reciprocal movement pattern retraining using Nustep for 17m217m0seconds totaling 225steps with continuous repeated cuing of reciprocal movement due to delayed initiation of L LE muscle activation. Gait training ~22f32fing RW (theraband on front for visual cuing to increase step length) with CGA/min assist for balance - continues to start walking with reciprocal pattern but after ~10ft85f decreased R LE step length with shuffling steps, after standing rest break can again start walking with reciprocal pattern but will revert back to shuffling decreased R step length. Gait training ~22ft 47fg RW with CGA/min assist and +2 providing theraband resistance on R LE to increase attention to increased step length with pt demonstrating slight improvement in reciprocal pattern but not consistent. Blocked practice stand pivot transfers w/c<>EOM using RW with focus on decreased steps to complete task to avoid shuffling steps with CGA for safety and pt demonstrating improvement with this task taking  few steps than in prior sessions. R/L lateral side stepping over cones in // bars with B UE support and  CGA/min assist for balance. Transported back to room and pt noted to be incontinent of bladder in brief (pt unaware). Standing with CGA for steadying while +2 assist helped complete LB clothing management and peri-care for time management. Pt left seated in w/c with needs in reach and seat belt alarm on.  Session 2: Pt received sitting in w/c with his wife, [name], present. Therapist discussed home set-up with 2STE (6" height) and R HR as well as pt's CLOF and need to use RW for ambulation - recommended transport chair for community mobility if needed for safety. Discussed HHPT vs OPPT pending pt's continued progress. [name] appears overwhelmed by pt's care - notified SW to provide community resources. Transported to/from gym in w/c for time management and energy conservation. Standing holding onto litegait with close supervision for safety donned litegait harness. Stepped on/off treadmill in litegait harness with CGA for steadying.  Performed the following locomotor treadmill training trails with litegait harness for safety but possibly providing slight partial BWS and using B UE support: 1st: 274mn45sec at 0.437m increasing to 0.74m374mtotaling 78f78fcuing for increased step length and foot clearance on R LE especially 2nd: 1min73mec at 0.74mph 18maling 71steps with therapist placing bean bags on treadmill for pt to step over targeting increased foot clearance and step lengths 3rd: 1min1739m at 0.7mph to83ming 75ft con93fing to use bean bags but this speed was too fast with pt having increased difficulty stepping 4th: 1min46sec92m 0.5mph total15m 77ft using 49f bags with pt demonstrating improving foot clearance at this speed   Stepped off treadmill and doffed harness as described above. Gait training ~125ft overgro57fusing RW with CGA progressed to min assist with fatigue - pt starts with reciprocal pattern and increased foot clearance but past ~10ft pt becom71fistracted by environment and fatigued  resulting in progression to shuffling gait pattern with decreased R LE foot clearance and step length. Transported to main gym.  At stairs performed repeated R LE foot taps on 2nd step using B HRs for support 2 x10 reps targeting increased speed of movement and repeated on L LE. Progressed to stepping L LE onto 1st step then tapping R LE on 3rd step using B HRs for support continuing to target increased speed of movement 2x10 reps using 7lb weight on 2nd set - repeated on L LE. CGA/min assist for balance provided throughout. Gait training ~75ft using RW 96f pt demonstrating overall fatigue with shuffling gait pattern. Transported back to room and left seated in w/c with needs in reach and seat belt alarm on.  Therapy Documentation Precautions:  Precautions Precautions: Fall, Other (comment) Precaution Comments: posterior and R lateral lean and h/o falls; HOH (using amplifier) Restrictions Weight Bearing Restrictions: No  Pain:   Session 1: Denies pain during session.  Session 2: Denies pain during session.  Therapy/Group: Individual Therapy  Gemini Beaumier M Elianny Buxbaum,Tawana Scale2021, 7:57 AM

## 2019-07-06 NOTE — Plan of Care (Signed)

## 2019-07-06 NOTE — Progress Notes (Signed)
Occupational Therapy Session Note  Patient Details  Name: Justin [name] MRN: [id number] Date of Birth: 02-21-1941  Today's Date: 07/06/2019 OT Individual Time: 1033-1100 OT Individual Time Calculation (min): 27 min    Short Term Goals: Week 2:  OT Short Term Goal 1 (Week 2): Continue working on established LTGs set at supervision overall.  Skilled Therapeutic Interventions/Progress Updates:  Patient met lying supine in bed in agreement with OT treatment session with focus on self-care re-education, functional transfers, and standing balance/tolerance during BADLs as detailed below. 0/10 pain at rest and with activity. Patient declined use of hearing amplifiers this session. Supine to EOB with Min A and sit to stand from EOB to RW with Min A and cueing for hand placement. Functional mobility to commode in bathroom in prep for toileting task. Patient continent of bladder completing hygiene/clothing management with Min A. Functional mobility to sink in bathroom with patient completing hand washing standing at sink level with CGA-Min A without BUE support on RW. Patient completed oral hygiene and grooming tasks seated at sink level with set-up assist. Session concluded with patient seated in wc with belt alarm activated, call bell within reach, and all needs met.   Therapy Documentation Precautions:  Precautions Precautions: Fall, Other (comment) Precaution Comments: posterior and R lateral lean and h/o falls; HOH (using amplifier) Restrictions Weight Bearing Restrictions: No  Therapy/Group: Individual Therapy  Keylen Eckenrode R Howerton-Davis 07/06/2019, 8:13 AM

## 2019-07-06 NOTE — Progress Notes (Signed)
Speech Language Pathology Daily Session Note  Patient Details  Name: [name] MRN: [id number] Date of Birth: 1940-06-14  Today's Date: 07/06/2019 SLP Individual Time: 1435-1520 SLP Individual Time Calculation (min): 45 min  Short Term Goals: Week 2: SLP Short Term Goal 1 (Week 2): STGs=LTGs due to ELOS  Skilled Therapeutic Interventions:  Pt was seen for skilled ST targeting cognitive goals.  Wife was seated at bedside upon arrival and reports that she feels pt is not yet back to baseline, specifically in regards to his response latency and memory.  Pt requested to use the bathroom when asked prior to starting a structured therapeutic activity and was continent of bowel while seated on elevated toilet seat.  Pt did void a small amount of urine onto the floor while having a bowel movement but this was largely related to positioning on commode rather than true incontinence.  Once pt was returned from bathroom, SLP facilitated the session with a novel card game targeting memory and attention goals.  Pt initially required max assist multimodal cues for recall of task rules and procedures; however, with increased task familiarity therapist was able to fade cues to min assist.  Pt was left in wheelchair with chair alarm set and call bell within reach.  Continue per current plan of care.    Pain Pain Assessment Pain Scale: 0-10 Pain Score: 0-No pain  Therapy/Group: Individual Therapy  Marlea Gambill, Selinda Orion 07/06/2019, 3:42 PM
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
## 2019-07-07 ENCOUNTER — Inpatient Hospital Stay (HOSPITAL_COMMUNITY): Payer: Medicare HMO | Admitting: Physical Therapy

## 2019-07-07 LAB — GLUCOSE, CAPILLARY
Glucose-Capillary: 98 mg/dL (ref 70–99)
Glucose-Capillary: 85 mg/dL (ref 70–99)
Glucose-Capillary: 112 mg/dL — ABNORMAL HIGH (ref 70–99)
Glucose-Capillary: 87 mg/dL (ref 70–99)

## 2019-07-07 MED ORDER — HYDROCERIN EX CREA
TOPICAL_CREAM | Freq: Two times a day (BID) | CUTANEOUS | Status: DC
  Administered 2019-07-07 – 2019-07-09 (×3): 1 via TOPICAL
  Filled 2019-07-07: qty 113
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
## 2019-07-07 NOTE — Progress Notes (Signed)
Physical Therapy Session Note  Patient Details  Name: Justin [name] MRN: [id number] Date of Birth: 03/17/1940  Today's Date: 07/07/2019 PT Individual Time: 1119-1201 PT Individual Time Calculation (min): 42 min   Short Term Goals: Week 2:  PT Short Term Goal 1 (Week 2): = to LTGs based on ELOS  Skilled Therapeutic Interventions/Progress Updates:    Pt received supine in bed and agreeable to therapy session. Pt reports he hasn't been OOB since last night. MD arriving for morning assessment. Pt incontinent of bladder stating he is only able to feel the urge sometimes - therapist reiterated importance of calling for assistance after incontinence to change into clean brief. Supine>sitting L EOB, HOB flat but relying heavily on bedrails, with close supervision and increased time. Sit>stand EOB>RW with min assist for lifting and balance due to posterior lean. Gait ~57ft to toilet using RW with CGA for steadying - continues to demonstrate shuffled gait pattern with R LE decreased step length more impaired and decreased B LE foot clearance. Sit<>stand to/from toilet using RW/grab bar with CGA for steadying. Continent of bladder - standing peri-care and hygiene with CGA for steadying. Donned shirt set-up assist and LB clothing mod assist for time management. Gait ~65ft to sink using RW with CGA for steadying and increased shuffle navigating around obstacles and performing AD management to sink. Standing hand hygiene, oral care, and face wash all with close supervision for safety. Gait training ~122ft using RW with CGA for steadying/safety and pt again demonstrates reciprocal pattern for ~47ft then becomes distracted by environment and reverts to shuffle gait pattern with decreased B LE foot clearance and step length (R more impaired) - standing reset breaks with pt able to improve reciprocal pattern a few steps. Pt left seated in w/c with needs in reach and seat belt alarm on.  Therapy  Documentation Precautions:  Precautions Precautions: Fall, Other (comment) Precaution Comments: posterior and R lateral lean and h/o falls; HOH (using amplifier) Restrictions Weight Bearing Restrictions: No  Pain:   Denies pain during session.  Therapy/Group: Individual Therapy  Tawana Scale, PT, DPT 07/07/2019, 7:58 AM

## 2019-07-08 ENCOUNTER — Inpatient Hospital Stay (HOSPITAL_COMMUNITY): Payer: Medicare HMO | Admitting: Speech Pathology

## 2019-07-08 LAB — GLUCOSE, CAPILLARY
Glucose-Capillary: 86 mg/dL (ref 70–99)
Glucose-Capillary: 111 mg/dL — ABNORMAL HIGH (ref 70–99)
Glucose-Capillary: 92 mg/dL (ref 70–99)
Glucose-Capillary: 118 mg/dL — ABNORMAL HIGH (ref 70–99)

## 2019-07-08 MED ORDER — AMLODIPINE BESYLATE 2.5 MG PO TABS
2.5000 mg | ORAL_TABLET | Freq: Every day | ORAL | Status: DC
  Administered 2019-07-09 – 2019-07-13 (×5): 2.5 mg via ORAL
  Filled 2019-07-08 (×5): qty 1

## 2019-07-08 NOTE — Progress Notes (Signed)
Physical Therapy Session Note  Patient Details  Name: Justin [name] MRN: [id number] Date of Birth: May 18, 1940  Today's Date: 07/08/2019 PT Individual Time: 1040-1119 PT Individual Time Calculation (min): 39 min   Short Term Goals: Week 2:  PT Short Term Goal 1 (Week 2): = to LTGs based on ELOS  Skilled Therapeutic Interventions/Progress Updates:    Pt received supine in bed and agreeable to therapy session. Pt noted to be soaked in urine from his chest to his knees - pt unaware though reports he knows when he has been incontinent - therapist reinforced education to pt regarding calling for assistance after being incontinent and reinforced to nursing staff on importance of timed toileting. Supine>sitting L EOB, HOB flat and using bedrails, with min assist for trunk upright from flat bed due to increased posterior lean. Sit>stand EOB>RW with min assist for lifting into standing due to pt not able to scoot hips fully to EOB prior to coming to stand. Gait ~64ft to [facility] over toilet with CGA/min assist for balance due to pt demonstrating increased shuffling gait today with significantly decreased B LE step length. Sat on BSC over toilet but pt unable to void further. UB and LB clothing management max assist for cleanliness. Stand pivot to shower chair using RW and grab bars with min assist. Completed seated bathing with max assist for washing back and posterior peri-area - required full supervision for showering due to impaired trunk control resulting in R posterior lean (pt using grab bars to stay upright) and min cuing to complete bathing task. Stand pivot to w/c using RW and grab bars with min assist. Sitting in w/c donned UB clothing set-up assist and LB clothing mod assist for time management and max assist for shoes. Pt left seated in w/c with needs in reach and seat belt alarm on.  Therapy Documentation Precautions:  Precautions Precautions: Fall, Other (comment) Precaution Comments: posterior and  R lateral lean and h/o falls; HOH (using amplifier) Restrictions Weight Bearing Restrictions: No  Pain: Denies pain during session.   Therapy/Group: Individual Therapy  Tawana Scale, PT, DPT 07/08/2019, 10:41 AM

## 2019-07-08 NOTE — Progress Notes (Signed)
Speech Language Pathology Daily Session Note  Patient Details  Name: Justin [name] MRN: [id number] Date of Birth: 12/04/1940  Today's Date: 07/08/2019 SLP Individual Time: [id number] SLP Individual Time Calculation (min): 55 min  Short Term Goals: Week 2: SLP Short Term Goal 1 (Week 2): STGs=LTGs due to ELOS  Skilled Therapeutic Interventions: Patient received skilled SLP services targeting cognitive goals. Patient participated in a sustained attention task for 30 minutes with a familiar card game with min verbal cues needed for re-direction to task. Patient required min verbal cues to create a grocery list of 15 items he needs at the store when he goes home as an external compensatory memory strategy. Patient responded to basic problem solving scenarios for ADLs with min verbal cues. At the end of therapy session patient was upright in bed, bed alarm activated, and all needs within reach.  Pain Pain Assessment Pain Scale: 0-10 Pain Score: 0-No pain  Therapy/Group: Individual Therapy  Cristy Folks 07/08/2019, 10:11 AM
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
## 2019-07-09 ENCOUNTER — Inpatient Hospital Stay (HOSPITAL_COMMUNITY): Payer: Medicare HMO

## 2019-07-09 ENCOUNTER — Inpatient Hospital Stay (HOSPITAL_COMMUNITY): Payer: Medicare HMO | Admitting: Occupational Therapy

## 2019-07-09 DIAGNOSIS — E1169 Type 2 diabetes mellitus with other specified complication: Secondary | ICD-10-CM

## 2019-07-09 DIAGNOSIS — E669 Obesity, unspecified: Secondary | ICD-10-CM

## 2019-07-09 DIAGNOSIS — E785 Hyperlipidemia, unspecified: Secondary | ICD-10-CM

## 2019-07-09 DIAGNOSIS — I10 Essential (primary) hypertension: Secondary | ICD-10-CM

## 2019-07-09 LAB — CBC
HCT: 45.8 % (ref 39.0–52.0)
Hemoglobin: 15.5 g/dL (ref 13.0–17.0)
MCH: 27.6 pg (ref 26.0–34.0)
MCHC: 33.8 g/dL (ref 30.0–36.0)
MCV: 81.5 fL (ref 80.0–100.0)
Platelets: 305 10*3/uL (ref 150–400)
RBC: 5.62 MIL/uL (ref 4.22–5.81)
RDW: 12.6 % (ref 11.5–15.5)
WBC: 9.9 10*3/uL (ref 4.0–10.5)
nRBC: 0 % (ref 0.0–0.2)

## 2019-07-09 LAB — BASIC METABOLIC PANEL
Anion gap: 12 (ref 5–15)
BUN: 32 mg/dL — ABNORMAL HIGH (ref 8–23)
CO2: 26 mmol/L (ref 22–32)
Calcium: 9.5 mg/dL (ref 8.9–10.3)
Chloride: 97 mmol/L — ABNORMAL LOW (ref 98–111)
Creatinine, Ser: 1.11 mg/dL (ref 0.61–1.24)
GFR calc Af Amer: >60 mL/min (ref >60)
GFR calc non Af Amer: >60 mL/min (ref >60)
Glucose, Bld: 108 mg/dL — ABNORMAL HIGH (ref 70–99)
Potassium: 4 mmol/L (ref 3.5–5.1)
Sodium: 135 mmol/L (ref 135–145)

## 2019-07-09 LAB — GLUCOSE, CAPILLARY
Glucose-Capillary: 94 mg/dL (ref 70–99)
Glucose-Capillary: 100 mg/dL — ABNORMAL HIGH (ref 70–99)
Glucose-Capillary: 127 mg/dL — ABNORMAL HIGH (ref 70–99)
Glucose-Capillary: 122 mg/dL — ABNORMAL HIGH (ref 70–99)

## 2019-07-09 NOTE — Progress Notes (Signed)
Placed pt on home cpap for the night.

## 2019-07-09 NOTE — Progress Notes (Signed)
Physical Therapy Session Note  Patient Details  Name: Justin [name] MRN: [id number] Date of Birth: October 18, 1940  Today's Date: 07/09/2019 PT Individual Time: 1300-1356 PT Individual Time Calculation (min): 56 min   Short Term Goals: Week 2:  PT Short Term Goal 1 (Week 2): = to LTGs based on ELOS  Skilled Therapeutic Interventions/Progress Updates:     Pt received seated in [facility] and agreeable to therapy. Reports no pain. Sit to stand from [facility] with minA and cues for anterior weight shift and sequencing. In standing pt practices static standing with eyes closed. 1st bout with RW and able to maintain 30 seconds with supervision. 2nd bout without UE support and pt requires min/modA due to quick LOB forward. 3rd bout pt maintains standing with CGA but slowly flexed forward at trunk and hips, and appears to have eye slightly open for entir time, despite cues to close eyes.   Pt transfers to Nustep with CGA and performs strengthening, endurance, and reciprocal coordination training on Nustep for 10 minutes at workload of 2. Pt requires cues for large movements going through full ROM due to tendency for small movements, especially when pt become distracted looking around room.   Pt ambulates 150' with minA and RW. Pt able to take ~10 good length steps at a time but then reverts to shuffling gait pattern, especially with RLE. PT cues for resets during ambulation and pt again able to achieve good stride lengths but only for several steps.   Pt performs repeated sit to stands. Initially pt holds 1lb bar out in front of trunk to encourage anterior weight shift without use of BUEs to push off mat table. Pt typically requires minA due to tendency to keep weight in heels. x10 reps performed. Pt then performs sit to stand 2x10 with cup placed 1-2 feet in front of pt and pt cued to pick up cup on way up and place cup back on ground as he is sitting. Pt reaches with R hand for 1st set and L hand for 2nd set. Pt has x1  LOB forward requiring modmaxA to prevent fall. Otherwise performs with CGA.  Pt left seated in WC with alarm intact and all needs within reach.  Therapy Documentation Precautions:  Precautions Precautions: Fall, Other (comment) Precaution Comments: posterior and R lateral lean and h/o falls; HOH (using amplifier) Restrictions Weight Bearing Restrictions: No    Therapy/Group: Individual Therapy  Breck Coons, PT, DPT 07/09/2019, 2:01 PM
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
## 2019-07-09 NOTE — Progress Notes (Signed)
Physical Therapy Session Note  Patient Details  Name: Justin [name] MRN: [id number] Date of Birth: 1940/10/20  Today's Date: 07/09/2019 PT Individual Time: [id number] PT Individual Time Calculation (min): 70 min   Short Term Goals: Week 2:  PT Short Term Goal 1 (Week 2): = to LTGs based on ELOS  Skilled Therapeutic Interventions/Progress Updates:    Pt seated in w/c playing connect 4 with his wife upon therapist arrival, agreeable to therapy tx and denies pain. Pt transported to the gym this session in w/c. Pt ambulated x 60 ft this session with RW and min assist, cues for R step length and foot clearance - pt continues with shuffling, intermittent freezing and lack of R foot clearance. Pt ascended/descended 12 steps this session (6 inch) using L rail and min assist, step to pattern with cues for techniques and cues to get R foot fully on step. Ambulated x 20 ft to the mat with RW and min assist. Pt worked on foot clearance and dynamic balance to perform the following tasks - using RW for B UE support performed alternating forwards toe taps x 2 trials, with single UE support on RW performed lateral toe taps on aerobic step 2 x 10 per LE - min assist for each. Pt worked on ambulation within the rehab apartment to simulate home environment, ambulated from w/c<>recliner 2 x 10 ft and performed recliner transfer, continued cues for attention and step length. Pt worked on standing balance and dual task this session while performing ball toss activity against rebounder trampoline and naming different colors - pt with difficulty doing this, pausing between ball tosses to think of a color. Pt transported back to room and ambulated from chair<>toilet 2 x 10 ft, timed toileting to help minimize episodes of incontinence, CGA for standing balance during clothing management, pt able to void while seated on toilet - continent of bladder. Left in w/c at end of session with needs in reach, chair alarm set and wife  present.   Therapy Documentation Precautions:  Precautions Precautions: Fall, Other (comment) Precaution Comments: posterior and R lateral lean and h/o falls; HOH (using amplifier) Restrictions Weight Bearing Restrictions: No    Therapy/Group: Individual Therapy  Netta Corrigan, PT, DPT, CSRS 07/09/2019, 4:13 PM

## 2019-07-09 NOTE — Progress Notes (Signed)
Occupational Therapy Session Note  Patient Details  Name: Justin [name] MRN: [id number] Date of Birth: [birth year]  Today's Date: 07/09/2019 OT Individual Time: 0800-0911 OT Individual Time Calculation (min): 71 min    Short Term Goals: Week 2:  OT Short Term Goal 1 (Week 2): Continue working on established LTGs set at supervision overall.  Skilled Therapeutic Interventions/Progress Updates:    Pt began session with transfer to sitting from supine with supervision using the rails for support.  Once in sitting, he was then able to donn his slippers with setup and complete functional mobility to the bathroom with min guard assist and use of the RW.  He continues to demonstrate short step length on the right side with right side lean and rotation to the right.  He completed toilet transfer with min guard assist but was not successful with attempt to urinate.  He then transferred over to the shower with min guard using the grab rails for support and completed all bathing at the same level.  Bladder incontinence noted when standing in the shower with use of the grab bar.  He was able to transfer out to the sink at min guard in the same manner as described above with regards to gait pattern.  He then completed grooming tasks of brushing his teeth at supervision and shaving at min assist for thoroughness.  He was able to complete UB dressing with supervision but needed min assist for LB dressing secondary to his shoes being too tight and not being able to place the heel into them.  Finished session with pt in the wheelchair with call button and phone in reach with safety belt in place.   Therapy Documentation Precautions:  Precautions Precautions: Fall, Other (comment) Precaution Comments: posterior and R lateral lean and h/o falls; HOH (using amplifier) Restrictions Weight Bearing Restrictions: No  Pain: Pain Assessment Pain Scale: Faces Pain Score: 0-No pain ADL: See Care Tool Section for some  details of mobility and selfcare  Therapy/Group: Individual Therapy  Jesica Goheen OTR/L 07/09/2019, 9:13 AM

## 2019-07-09 NOTE — Plan of Care (Signed)
  Problem: Education: Goal: Knowledge of General Education information will improve Description: Including pain rating scale, medication(s)/side effects and non-pharmacologic comfort measures Outcome: Progressing   Problem: Health Behavior/Discharge Planning: Goal: Ability to manage health-related needs will improve Outcome: Progressing   Problem: Clinical Measurements: Goal: Ability to maintain clinical measurements within normal limits will improve Outcome: Progressing Goal: Will remain free from infection Outcome: Progressing Goal: Diagnostic test results will improve Outcome: Progressing Goal: Respiratory complications will improve Outcome: Progressing Goal: Cardiovascular complication will be avoided Outcome: Progressing   Problem: Activity: Goal: Risk for activity intolerance will decrease Outcome: Progressing   Problem: Nutrition: Goal: Adequate nutrition will be maintained Outcome: Progressing   Problem: Elimination: Goal: Will not experience complications related to bowel motility Outcome: Progressing Goal: Will not experience complications related to urinary retention Outcome: Progressing   Problem: Pain Managment: Goal: General experience of comfort will improve Outcome: Progressing   Problem: Safety: Goal: Ability to remain free from injury will improve Outcome: Progressing   Problem: Skin Integrity: Goal: Risk for impaired skin integrity will decrease Outcome: Progressing   Problem: Consults Goal: RH STROKE PATIENT EDUCATION Description: See Patient Education module for education specifics  Outcome: Progressing   Problem: RH BOWEL ELIMINATION Goal: RH STG MANAGE BOWEL WITH ASSISTANCE Description: STG Manage Bowel with Min Assistance. Outcome: Progressing   Problem: RH BLADDER ELIMINATION Goal: RH STG MANAGE BLADDER WITH ASSISTANCE Description: STG Manage Bladder With min Assistance Outcome: Progressing   Problem: RH SAFETY Goal: RH STG  ADHERE TO SAFETY PRECAUTIONS W/ASSISTANCE/DEVICE Description: STG Adhere to Safety Precautions With cues/reminders Assistance/Device. Outcome: Progressing   Problem: RH KNOWLEDGE DEFICIT Goal: RH STG INCREASE KNOWLEDGE OF DIABETES Description: Patient and wife will be able to manage DM using handouts, information, dietary restrictions and medications with cues/reminders Outcome: Progressing Goal: RH STG INCREASE KNOWLEDGE OF HYPERTENSION Description: Patient and wife will be able to manage HTN using handouts, books, etc on medications, dietary recommendations and exercise with cues and reminders Outcome: Progressing Goal: RH STG INCREASE KNOWLEDGE OF STROKE PROPHYLAXIS Description: Patient and wife will be able to manage stroke prophylaxis using handouts/books on medications ; scheduling and rationale for medications with cues/reminders Outcome: Progressing

## 2019-07-10 ENCOUNTER — Inpatient Hospital Stay (HOSPITAL_COMMUNITY): Payer: Medicare HMO | Admitting: Speech Pathology

## 2019-07-10 ENCOUNTER — Encounter (HOSPITAL_COMMUNITY): Payer: Medicare HMO | Admitting: Psychology

## 2019-07-10 ENCOUNTER — Inpatient Hospital Stay (HOSPITAL_COMMUNITY): Payer: Medicare HMO | Admitting: Occupational Therapy

## 2019-07-10 ENCOUNTER — Inpatient Hospital Stay (HOSPITAL_COMMUNITY): Payer: Medicare HMO | Admitting: Physical Therapy

## 2019-07-10 LAB — GLUCOSE, CAPILLARY
Glucose-Capillary: 97 mg/dL (ref 70–99)
Glucose-Capillary: 114 mg/dL — ABNORMAL HIGH (ref 70–99)
Glucose-Capillary: 123 mg/dL — ABNORMAL HIGH (ref 70–99)
Glucose-Capillary: 150 mg/dL — ABNORMAL HIGH (ref 70–99)

## 2019-07-10 MED ORDER — LIVING WELL WITH DIABETES BOOK
Freq: Once | Status: AC
  Filled 2019-07-10: qty 1

## 2019-07-10 MED ORDER — DOCUSATE SODIUM 100 MG PO CAPS
100.0000 mg | ORAL_CAPSULE | Freq: Three times a day (TID) | ORAL | Status: DC
  Administered 2019-07-10 – 2019-07-13 (×9): 100 mg via ORAL
  Filled 2019-07-10 (×9): qty 1

## 2019-07-10 MED ORDER — BLOOD PRESSURE CONTROL BOOK
Freq: Once | Status: AC
  Filled 2019-07-10: qty 1

## 2019-07-10 NOTE — Progress Notes (Signed)
Occupational Therapy Session Note  Patient Details  Name: Justin [name] MRN: [id number] Date of Birth: 05/15/1940  Today's Date: 07/10/2019 OT Individual Time: [id number] OT Individual Time Calculation (min): 56 min    Short Term Goals: Week 2:  OT Short Term Goal 1 (Week 2): Continue working on established LTGs set at supervision overall.  Skilled Therapeutic Interventions/Progress Updates:    Pt in bed to start session, agreeable to therapy.  He was then able to transfer to the EOB but needed increased time and mod instructional cueing for technique to roll to the side and then sit up from sidelying.  He instead tried to swing his LEs off of the EOB and sit up unsuccessfully.  Once on the EOB, he completed stand pivot transfer to the wheelchair with min guard assist using the RW for support.  Once in the chair, he was positioned at the sink where he completed oral hygiene as well as peri washing and donning LB clothing.  Min guard assist for standing to wash peri area and donn brief and shorts.  He was able to donn his shoes and tie them with use of the shoe funnel and min instructional cueing.  Next, took pt down to the dayroom where he transferred to the Nustep.  He completed two sets of 6 mins with resistance on level 6 as well.  Pt with decreased divided attention as at times he would get distracted with what other people were doing in the gym requiring mod instructional cueing for re-direction.  Average number of steps were around 40-50 depending on his attention level.  Finished session with return to the room and call button and phone left in reach with safety belt in place.   Therapy Documentation Precautions:  Precautions Precautions: Fall, Other (comment) Precaution Comments: posterior and R lateral lean and h/o falls; HOH (using amplifier) Restrictions Weight Bearing Restrictions: No  Pain: Pain Assessment Pain Scale: Faces Pain Score: 0-No pain ADL: See Care Tool Section for  some details of mobility and selfcare  Therapy/Group: Individual Therapy  Brooke Steinhilber OTR/L 07/10/2019, 8:57 AM

## 2019-07-10 NOTE — Progress Notes (Signed)
Patient ID: Justin [name], male   DOB: 1940/04/11, 79 y.o.   MRN: [id number]   Patient transport chair ordered.

## 2019-07-10 NOTE — Progress Notes (Signed)
Speech Language Pathology Daily Session Note  Patient Details  Name: Justin [name] MRN: [id number] Date of Birth: 1940-11-04  Today's Date: 07/10/2019 SLP Individual Time: 1001-1058 SLP Individual Time Calculation (min): 57 min  Short Term Goals: Week 2: SLP Short Term Goal 1 (Week 2): STGs=LTGs due to ELOS  Skilled Therapeutic Interventions: Pt was seen for skilled ST targeting cognition. SLP facilitated session with a familiar medication management task. Pt accurately organized BID pill box with 1X daily pills (which comprise the majority of his medications), however Min A verbal and visual cues were required for interpretation and problem solving how to organize 2X daily pills. Pt expressed need to void, therefore assisted with safe transfer to toilet, however he was unable to successfully void. Overall Min A verbal cues provided for safety awareness during transfers and ambulation with rolling walker. Pt left sitting in wheelchair with alarm set and needs within reach. Continue per current plan of care.        Pain Pain Assessment Pain Scale: 0-10 Pain Score: 0-No pain  Therapy/Group: Individual Therapy  Arbutus Leas 07/10/2019, 7:06 AM

## 2019-07-10 NOTE — Progress Notes (Signed)
Physical Therapy Session Note  Patient Details  Name: Justin [name] MRN: [id number] Date of Birth: [birth year]  Today's Date: 07/10/2019 PT Individual Time: 1423-1531 PT Individual Time Calculation (min): 68 min   Short Term Goals: Week 2:  PT Short Term Goal 1 (Week 2): = to LTGs based on ELOS  Skilled Therapeutic Interventions/Progress Updates:    Pt received sitting in w/c and agreeable to therapy session. [name], [name], arriving shortly after PT and inquiring about hands-on training and agreeable to participate in today's session. Pt noted to be incontinent of bladder and unaware - therapist educated pt's wife on timed toileting at home and she reports understanding. Sit<>stand using RW with CGA for steadying throughout session. Gait training ~80ft x2 in/out bathroom using RW with CGA for steadying - min cuing for AD management, especially over bathroom door threshold - continues to demonstrates shuffling gait pattern with decreased B LE foot clearance and step length (R LE more impaired). Standing with CGA performed LB clothing management with min assist for cleanliness. Unable to void further on toilet. Standing hand hygiene with CGA for safety and min cuing for turning on water as pt thought soap was hand sanitizer. Therapist educated pt's wife on proper use of gait belt and positioning on pt's R during standing/ambulation as well as pt's CLOF, need to use RW, and need for 24hr support at D/C. Ambulated ~5ft using RW with pt's wife providing proper CGA for safety - shuffled gait pattern with R LE more impaired and this becomes more exaggerated in hallway when pt is distracted by environment. Transported remainder of distance to therapy gym in w/c. Educated wife on proper positioning for assistance on stairs and proper AD management - she reports they have 2 STE with L HR only. Pt ascended/descended 4 steps (6" height) using L HR and side-step technique with CGA for steadying - increased  difficulty ascending compared to descending. Pt/wife performed this again 2x demonstrating understanding of proper technique and assist with min cuing. Transported to ADL apartment for household ambulation training. Pt ambulated ~67ft using RW in ADL apartment through kitchen to recliner with pt's wife providing proper CGA - pt demonstrated slightly improved gait mechanics in this environment because it was quiet. Furniture transfer Building services engineer) using RW with CGA from pt's wife. Transported to ortho gym in w/c. Ambulatory simulated car transfer (SUV height) using RW with pt's wife providing proper CGA and pt not demonstrating any difficulty with this task. Gait training ~19ft x3 using RW through agility ladder targeting increased step length with min assist for AD management - repeated 1x without AD using R HHA with min/mod assist for balance and pt demonstrating 2x L knee slight buckle therefore deferred additional repetitions. Transported back to room. [name] reports no questions/concerns regarding training from today. Pt left seated in w/c with needs in reach, seat belt alarm on, and pt's wife present.  Therapy Documentation Precautions:  Precautions Precautions: Fall, Other (comment) Precaution Comments: posterior and R lateral lean and h/o falls; HOH (using amplifier) Restrictions Weight Bearing Restrictions: No  Pain: Denies pain during session.   Therapy/Group: Individual Therapy  Tawana Scale, PT, DPT 07/10/2019, 12:23 PM
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
## 2019-07-10 NOTE — Consult Note (Signed)
Neuropsychological Consultation   Patient:   Justin [name]   DOB:   1940/10/29  MR Number:  [id number]  Location:  [facility] [id number] [place] [address] Dept: [place]: [phone]           Date of Service:   07/10/2019  Start Time:   1 PM End Time:   2 PA  Provider/Observer:  Ilean Skill, Psy.D.       Clinical Neuropsychologist       Billing Code/Service: 96158/[id number]  Chief Complaint:    Justin [name] is a 79 year old male with history of HTN, diabetes, C HI, lumbar radiculopathy with gait disorder/falls, and multiple prior CVA.  Patient also had a TBI with SAH that wife says was the start of his decline.  Patient with history of depression as well.  Patient was admitted on 06/24/2019 with multiple falls in the week prior to admission, speech difficulty and weakness.  MRI brain done revealing 2 small acute infarcts in right MCA territory and advanced chronic ischemic changes with widespread atrophy advanced for age.  Widespread SVD.  Dr. Erlinda Hong felt stroke was due to SVD.  Patient with prior and worsened balance deficits, right lateral lean, impaired cognition with decreased activity tolerance.  Wife is concerned about patient's prior activity level and that he really declined after the prior [facility]  He would improve after therapies but then go home and just watch TV and expect wife to do everything.  Lack of initiation from previous strokes and falls.  Patient is looking at discharge this coming Friday.    Reason for Service:  Patient referred for neuropsychological evaluation due to motivation and initiation issues.  Below is the HPI for the current admission.  HPI: Justin [name]. 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  He was admitted on 06/24/2019  with multiple falls in the week prior to admission, speech difficulty and weakness.  MRI brain done revealing 2 small acute infarcts in right MCA territory and advanced chronic ischemic changes with widespread atrophy advanced for age.  CTA head/neck was negative for high-grade occlusion or stenosis but multifocal areas of atherosclerotic irregularity noted.  Carotid Dopplers were negative ICA stenosis.  BLE Dopplers negative for DVT.  2D echo showed EF of 70 to 75%, hyperdynamic function with grade 1 diastolic dysfunction and no shunt.  Dr. Erlinda Hong felt the stroke was due to small vessel disease and recommended DAPT x3 months followed by Plavix alone as well as 30-day event monitor to rule out A. fib given 2 separate stroke locations.--Cardiology to be contacted for event monitor prior to discharge..  Patient with resultant balance deficits, right lateral lean, impaired cognition with decrease in activity tolerance.  CIR was recommended due to functional decline  Wife very concerned that [name], [name], which has been a problem for him in the past.  The patient reported that he felt like his wife would have already been there today but she had not arrived at the hospital.  I will take some time  later this afternoon to try to see the patient and answer any questions that his wife may have that she was not able to ask because she was not in his room yet this afternoon.  Behavioral Observation: Justin [name]  presents as a 79 y.o.-year-old Right Caucasian Male who appeared his stated age. his dress was Appropriate and he was Well Groomed and his manners were Appropriate to the situation.  his participation was indicative of Appropriate and Redirectable behaviors.  There were any physical disabilities noted.  he displayed an appropriate level of cooperation and motivation.     Interactions:    Active Appropriate and Redirectable  Attention:   abnormal and attention span appeared shorter than expected for age  Memory:   abnormal; remote memory intact, [name]; slowed  Thought Process:  Coherent and Relevant  Though Content:  WNL; not suicidal and not homicidal  Orientation:   person, place, [name]  Medical History:   Past Medical History:  Diagnosis Date  . Brain bleed ([place])   . Cancer [facility])    prostate  . 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 Depression   . Diabetes mellitus without complication ([place])   . High cholesterol   . Hypertension   . Stroke [facility])    Psychiatric History:  Past history of depression  Family Med/Psych History:  Family History  Problem Relation Age of Onset  . CAD Mother   . Diabetes Mother   . Emphysema Father   . Diabetes Sister   . Diabetes Brother   . Diabetes Sister   . Diabetes Brother   . Diabetes Mellitus II Neg Hx    Impression/DX:  Justin [name] is a 79 year old male with history of HTN, diabetes, C HI, lumbar radiculopathy with gait disorder/falls, and multiple prior CVA.  Patient also had a  TBI with SAH that wife says was the start of his decline.  Patient with history of depression as well.  Patient was admitted on 06/24/2019 with multiple falls in the week prior to admission, speech difficulty and weakness.  MRI brain done revealing 2 small acute infarcts in right MCA territory and advanced chronic ischemic changes with widespread atrophy advanced for age.  Widespread SVD.  Dr. Erlinda Hong felt stroke was due to SVD.  Patient with prior and worsened balance deficits, right lateral lean, impaired cognition with decreased activity tolerance.  Wife is concerned about patient's prior activity level and that he really declined after the prior [facility].  He would improve after therapies but then go home and just watch TV and expect wife to do everything.  Lack of initiation from previous strokes and falls.   Patient awake  and sitting in wheelchair and had finished his lunch when I arrived in his room.  He was alert and stated that he remembered me from our previous meeting.  We reviewed issues related to his rehab and how he is coping with extended hospital stay and issues related to his gait changes.  The patient reports that he feels like he is benefiting from the ongoing therapies.  I stressed the issue for his need to continue with his physical therapy and physical activity once he is discharged home, which has been a problem for him in the past.  The patient reported that he felt like his wife would have already been there today but she had not arrived at the hospital.  I will take some time later this afternoon to try to see the patient and answer any questions that his wife may have that she was not able to ask because she was not in his room yet this afternoon.  Disposition/Plan:  Worked on coping and depressive issues.  Patient denied significant depression currently.  Will try to see patient's wife later this afternoon.         Electronically Signed   _______________________ Ilean Skill,  Psy.D.

## 2019-07-11 ENCOUNTER — Encounter (HOSPITAL_COMMUNITY): Payer: Medicare HMO | Admitting: Speech Pathology

## 2019-07-11 ENCOUNTER — Inpatient Hospital Stay (HOSPITAL_COMMUNITY): Payer: Medicare HMO

## 2019-07-11 ENCOUNTER — Encounter (HOSPITAL_COMMUNITY): Payer: Medicare HMO | Admitting: Occupational Therapy

## 2019-07-11 ENCOUNTER — Ambulatory Visit (HOSPITAL_COMMUNITY): Payer: Medicare HMO

## 2019-07-11 LAB — GLUCOSE, CAPILLARY
Glucose-Capillary: 70 mg/dL (ref 70–99)
Glucose-Capillary: 82 mg/dL (ref 70–99)
Glucose-Capillary: 131 mg/dL — ABNORMAL HIGH (ref 70–99)
Glucose-Capillary: 121 mg/dL — ABNORMAL HIGH (ref 70–99)

## 2019-07-11 MED ORDER — GLIPIZIDE 5 MG PO TABS
2.5000 mg | ORAL_TABLET | Freq: Every day | ORAL | Status: DC
  Administered 2019-07-12 – 2019-07-13 (×2): 2.5 mg via ORAL
  Filled 2019-07-11 (×2): qty 1

## 2019-07-11 MED ORDER — SENNOSIDES-DOCUSATE SODIUM 8.6-50 MG PO TABS: 2.0000 | ORAL_TABLET | Freq: Every day | ORAL | Status: AC

## 2019-07-11 NOTE — Progress Notes (Signed)
Occupational Therapy Session Note  Patient Details  Name: Justin [name] MRN: [id number] Date of Birth: 1941-02-14  Today's Date: 07/11/2019 OT Individual Time: [id number] OT Individual Time Calculation (min): 57 min    Short Term Goals: Week 2:  OT Short Term Goal 1 (Week 2): Continue working on established LTGs set at supervision overall.  Skilled Therapeutic Interventions/Progress Updates:    Pt's spouse in for session for education.  Had pt complete ADL session with use of the shower.  He was able to complete transfer to the shower with min guard assist and remove clothing sit to stand at the same level.  He completed bathing sit to stand from the shower seat with min guard assist for balance.  He needed min instructional cueing for thoroughness to recall the need to wash his buttocks.  He then transferred out to the wheelchair at the sink with min guard for dressing and grooming tasks.  He sat in the wheelchair and completed shaving with a regular razor with overall min assist for thoroughness only.  He then completed oral hygiene with setup as well as combing his hair with supervision.  He donned all clothing with supervision sit to stand.  Shoe funnel was utilized for donning shoes with min assist to loosen them prior to donning them.  He was able to tie them with supervision as well.  Finished session with pt staying up in the wheelchair with call button and phone in reach and safety alarm belt in place.  All questions answered from spouse as well throughout session.    Therapy Documentation Precautions:  Precautions Precautions: Fall, Other (comment) Precaution Comments: posterior and R lateral lean and h/o falls; HOH (using amplifier) Restrictions Weight Bearing Restrictions: No   Pain: Pain Assessment Pain Scale: Faces Pain Score: 0-No pain ADL: See Care Tool Section for some details of mobility and selfcare  Therapy/Group: Individual Therapy  Tina Temme OTR/L 07/11/2019,  12:11 PM
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
## 2019-07-11 NOTE — Patient Care Conference (Signed)
Inpatient RehabilitationTeam Conference and Plan of Care Update Date: 07/11/2019   Time: 2:16 PM    Patient Name: Justin [name]      Medical Record Number: [id number]  Date of Birth: 03/21/1940 Sex: Male         Room/Bed: 4W11C/4W11C-01 Payor Info: Payor: [employer] MEDICARE / Plan: [place] HMO / Product Type: *No Product type* /    Admit Date/Time:  06/28/2019  4:47 PM  Primary Diagnosis:  Acute right MCA stroke [facility])  Patient Active Problem List   Diagnosis Date Noted  . Dyslipidemia   . Benign essential HTN   . Diabetes mellitus type 2 in obese ([place])   . Major depressive disorder, recurrent episode, moderate ([place])   . Gait disorder   . Acute right MCA stroke ([place]) 06/28/2019  . Ambulatory dysfunction   . Stroke ([place]) 06/24/2019  . Type 2 diabetes mellitus ([place]) 10/04/2018  . Depression 10/04/2018  . Acute CVA (cerebrovascular accident) ([place]) 09/18/2017  . Stroke (cerebrum) ([place]) 09/17/2017  . Diabetes mellitus without complication ([place]) [id number]  . Closed head injury 04/22/2013  . HLD (hyperlipidemia) 07/29/2009  . Essential hypertension 07/29/2009  . EFFUSION, PLEURAL 07/29/2009  . DIVERTICULITIS, COLON 07/29/2009  . 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 COLONIC POLYPS, HYPERPLASTIC, HX OF 07/29/2009    Expected Discharge Date: Expected Discharge Date: 07/13/19  Team Members Present: Physician leading conference: Dr. Alysia Penna Care Coodinator Present: Nestor Lewandowsky, RN, BSN, CRRN;Christina Sampson Goon, BSW Nurse Present: Isla Pence, RN PT Present: Michaelene Song, PT OT Present: Clyda Greener, OT SLP Present: Jettie Booze, CF-SLP PPS Coordinator present : Gunnar Fusi, SLP     Current Status/Progress Goal Weekly Team Focus  Bowel/Bladder   Patient is continent of bowel and bladder with occasional incontinent episodes (adult brief)  Patient will maintain bowel and bladder continence and verbalizes toileting needs  Assess Qshift and PRN with Q2-3H  toileting. Monitor nutritional intake.   Swallow/Nutrition/ Hydration             ADL's   Supervision for UB selfcare with min guard for LB selfcare with AE PRN.  Transfers to the toilet and the walk-in shower are at min guard assist as well with use of the RW for support.  supervision overall  selfcare retraining, balance retraining, transfer training, DME education, neuromuscular re-education, pt/family education   Mobility   CGA/min assist bed mobility, CGA sit<>stand and stand pivot transfers using RW, ambulation up to 156ft using RW with CGA/min assist; 12 (6" height) steps using L HR with CGA  CGA overall at ambulatory level  activity tolerance, pt/family education and hands-on training, B LE strengthening, dynamic standing balance, dynamic gait training, B LE NMR, processing/initiation/motor planning of mobility tasks, stair navigation   Communication             Safety/Cognition/ Behavioral Observations  Min A  Supervision  intellectual awareness, recall with strategies, sustained attention, family education   Pain   Patient denies any pain, discomfort, or disturbance  Patient will remain pain free with and without activity  Assess pain level Qshift and PRN   Skin   Patient's skin is intact with dry patches relieved by lotions and barrier creams  Patient will maintain skin integrity and be free of breakdown and infection  Assess skin Qshift and PRN. Encourage skin care with attention to peri care following toileting episodes.    Rehab Goals Patient on target to meet rehab goals: Yes Rehab Goals Revised: On target with goals *See Care  Plan and progress notes for long and short-term goals.     Barriers to Discharge  Current Status/Progress Possible Resolutions Date Resolved   Nursing                  PT                    OT                  SLP                Care Coordinator Medical stability              Discharge Planning/Teaching Needs:  Patient and spouse plan  to discharge patient home  Will schedule if recommended   Team Discussion:   MD to adjust meds for Diabetes due to low blood sugar levels. Continue to note parkinsonian like gait/shuffles and decreased selective attention and is easily distracted. Family education 07/11/19 and supervision goals.  Revisions to Treatment Plan:  On target for discharge    Medical Summary Current Status: Blood pressure and CBG controlled although a.m. blood sugar today was on the low side Weekly Focus/Goal: Reduce Glucotrol dose  Barriers to Discharge: Medical stability   Possible Resolutions to Barriers: Medication adjustment for diabetes   Continued Need for Acute Rehabilitation Level of Care: The patient requires daily medical management by a physician with specialized training in physical medicine and rehabilitation for the following reasons: Direction of a multidisciplinary physical rehabilitation program to maximize functional independence : Yes Medical management of patient stability for increased activity during participation in an intensive rehabilitation regime.: Yes Analysis of laboratory values and/or radiology reports with any subsequent need for medication adjustment and/or medical intervention. : Yes   I attest that I was present, lead the team conference, and concur with the assessment and plan of the team.   Dorien Chihuahua B 07/11/2019, 2:16 PM

## 2019-07-11 NOTE — Progress Notes (Signed)
Speech Language Pathology Daily Session Note  Patient Details  Name: Justin [name] MRN: [id number] Date of Birth: 1941-02-21  Today's Date: 07/11/2019 SLP Individual Time: 1300-1344 SLP Individual Time Calculation (min): 44 min  Short Term Goals: Week 2: SLP Short Term Goal 1 (Week 2): STGs=LTGs due to ELOS  Skilled Therapeutic Interventions: Pt was seen for skilled ST targeting education with pt and his wife (caregiver). SLP facilitated session with functional conversation regarding ST goals and progress throughout admission. Reviewed how current impairments impact his processing speed and daily functioning in regards to reduced problem solving, attention in distracting environments, and short term memory. Handout and verbal review provided for compensatory memory and attention as well as auditory processing strategies with emphasis on routines, note taking, limiting distractions, no multitasking, and communicating in concrete/direct manner. Reiterated recommendation for 24/7 supervision and assistance with meds/finances/all mildly complex cognitive tasks. Pt expressed need to void during session, during which Min A verbal cues were provided for safety during ambulation with RW to toilet. Pt left with NT and wife still present to finish toileting. Continue per current plan of care.        Pain Pain Assessment Pain Scale: 0-10 Pain Score: 0-No pain  Therapy/Group: Individual Therapy  Arbutus Leas 07/11/2019, 6:59 AM

## 2019-07-11 NOTE — Progress Notes (Signed)
Physical Therapy Session Note  Patient Details  Name: Justin [name] MRN: [id number] Date of Birth: [birth year]  Today's Date: 07/11/2019 PT Individual Time: 1000-1028 and 1350-1445 PT Individual Time Calculation (min): 28 min and 55 min    Short Term Goals: Week 2:  PT Short Term Goal 1 (Week 2): = to LTGs based on ELOS  Skilled Therapeutic Interventions/Progress Updates:    Session 1: Pt supine in bed upon PT arrival, agreeable to therapy tx and denies pain. Pt transferred to sitting with CGA and cues for techniques. Pt seated EOB therapist assisted to don shorts, sit<>stand RW and CGA while pt pulled pants over hips. Donned shoes total assist. Pt worked on gait training this session to ambulate from room<>dayroom 2 x 100 ft with RW and CGA, cues throughout for increased step length and R foot clearance. Pt performed x 10 sit<>stands from chair combined with UE raise overhead in standing, emphasis on speed/power. Pt performed 2 x 10 toe taps on 4 inch step without UE support, min assist, focus on foot clearance and speed. Pt left seated in w/c at end of session, chair alarm set and needs in reach.   Session 2: Pt seated in w/c upon PT arrival, agreeable to therapy tx and denies pain. Pt transported to the gym in w/c, wife observed session for family education, she reports she practiced everything yesterday. Pt worked on gait this session, ambulated 2 x 85 ft with RW and CGA, occasional pauses secondary to shuffling to "restart." Pt used agility ladder as a target during gait without AD x 4, cues to step on the orange rungs for target, pt with improved step length during this but then poor carryover when ambulating without target, min-mod assist. Wife does report she has not seen him practice getting in/out of bed. Wife reports his bed is low about the height of the mat and more firm. Pt ambulated from w/c>mat x 10 ft min assist and transferred to mat to work on bed mobility. Pt performed bed  mobility including supine<>sitting with supervision and cues for technique/safety, wife educated on techniques. While supine pt performed bridges x10 for strengthening, discussed performing this exercise at home. Also discussed doing toe taps as an exercise for home. Pt worked on standing balance, speed and power this session to perform boxing activity - x 4 trials with cues for bigger/harder punches. Pt ambulated x 85 ft again this session with min assist, educated wife on using standing rest breaks as needed if shuffling increases. Pt transported back to room and left in w/c with needs in reach and chair alarm set.      Therapy Documentation Precautions:  Precautions Precautions: Fall, Other (comment) Precaution Comments: posterior and R lateral lean and h/o falls; HOH (using amplifier) Restrictions Weight Bearing Restrictions: No    Therapy/Group: Individual Therapy  Netta Corrigan, PT, DPT, CSRS 07/11/2019, 8:00 AM

## 2019-07-11 NOTE — Progress Notes (Signed)
Team Conference Report to Patient/Family  Team Conference discussion was reviewed with the patient and caregiver, including goals, any changes in plan of care and target discharge date.  Patient and caregiver express understanding and are in agreement.  The patient has a target discharge date of 07/13/19.  Dyanne Iha 07/11/2019, 2:49 PM Patient ID: Justin [name], male   DOB: 12-09-1940, 79 y.o.   MRN: [id number]

## 2019-07-12 ENCOUNTER — Inpatient Hospital Stay (HOSPITAL_COMMUNITY): Payer: Medicare HMO | Admitting: Speech Pathology

## 2019-07-12 ENCOUNTER — Inpatient Hospital Stay (HOSPITAL_COMMUNITY): Payer: Medicare HMO | Admitting: Occupational Therapy

## 2019-07-12 ENCOUNTER — Inpatient Hospital Stay (HOSPITAL_COMMUNITY): Payer: Medicare HMO | Admitting: Physical Therapy

## 2019-07-12 ENCOUNTER — Inpatient Hospital Stay (HOSPITAL_COMMUNITY): Payer: Medicare HMO

## 2019-07-12 LAB — GLUCOSE, CAPILLARY
Glucose-Capillary: 80 mg/dL (ref 70–99)
Glucose-Capillary: 109 mg/dL — ABNORMAL HIGH (ref 70–99)
Glucose-Capillary: 105 mg/dL — ABNORMAL HIGH (ref 70–99)
Glucose-Capillary: 161 mg/dL — ABNORMAL HIGH (ref 70–99)

## 2019-07-12 NOTE — Progress Notes (Signed)
Occupational Therapy Discharge Summary  Patient Details  Name: Justin [name] MRN: [id number] Date of Birth: [birth year]  Today's Date: 07/12/2019 OT Individual Time: 6294-7654 OT Individual Time Calculation (min): 57 min   Session Note:  Pt in bed to start session.  He was able to transfer out to the EOB with increased time and supervision without use of a bed rail.  He then worked on donning his shoes with use of the shoe funnel.  Supervision needed secondary to increased sway to the left when bending down to his feet.  He was able to donn and tie the shoes without therapist assist.  Supervision was then needed for transfer to the wheelchair with use of the RW for support.  He was able to then ambulate to the dayroom with min assist and no assistive device.  Max instructional cueing was needed to encourage larger steps on the right as pt would demonstrate shuffling on both sides after taking 2-3 steps and then would have to stop and restart to break the pattern.  Once in the dayroom, he transferred to the [facility] where he completed 4 sets with resistance on 30 cm/sec.  Two sets were completed in sitting for 3 mins each with mod instructional cueing for larger ROM and for selective attention.  He also completed 2 sets of 1 minute in standing with min assist for balance as well, but fatigued after this time and needed to rest.  Finished session with return to the room to the wheelchair.  He was left up in the wheelchair with call button and phone in reach with safety belt in place.    Patient has met 10 of 11 long term goals due to improved activity tolerance, improved balance and ability to compensate for deficits.  Patient to discharge at overall Supervision level.  Patient's care partner is independent to provide the necessary physical and cognitive assistance at discharge.    Reasons goals not met: Pt continues to need supervision for dynamic sitting balance.   Recommendation:  Patient will  benefit from ongoing skilled OT services in home health setting to continue to advance functional skills in the area of BADL and Reduce care partner burden. Pt will benefit from continued HHOT for further progression of ADL functional as well as balance and cognition.  He is currently at a supervision level for most tasks.   Equipment: No equipment provided  Reasons for discharge: treatment goals met and discharge from hospital  Patient/family agrees with progress made and goals achieved: Yes  OT Discharge Precautions/Restrictions  Precautions Precautions: Fall;Other (comment) Precaution Comments: posterior and R lateral lean and h/o falls; HOH (using amplifier) Restrictions Weight Bearing Restrictions: No  Pain Pain Assessment Pain Score: 0-No pain ADL ADL Eating: Independent Where Assessed-Eating: Wheelchair Grooming: Independent Where Assessed-Grooming: Wheelchair Upper Body Bathing: Supervision/safety Where Assessed-Upper Body Bathing: Shower Lower Body Bathing: Supervision/safety Where Assessed-Lower Body Bathing: Shower Upper Body Dressing: Supervision/safety Where Assessed-Upper Body Dressing: Wheelchair Lower Body Dressing: Supervision/safety Where Assessed-Lower Body Dressing: Wheelchair Toileting: Supervision/safety Where Assessed-Toileting: Glass blower/designer: Close supervision Toilet Transfer Method: Counselling psychologist: Grab bars, Raised toilet seat Social research officer, government: Close supervision Youth worker: Civil engineer, contracting with back, Grab bars Vision Baseline Vision/History: No visual deficits Patient Visual Report: No change from baseline Vision Assessment?: Yes Eye Alignment: Within Functional Limits Alignment/Gaze Preference: Within Defined Limits Tracking/Visual Pursuits: Able to track stimulus in all quads without difficulty Saccades: Within functional limits Convergence: Within functional limits Visual Fields: No  apparent deficits Perception  Perception: Within Functional Limits Praxis Praxis: Impaired Praxis Impairment Details: Motor planning Cognition Overall Cognitive Status: Impaired/Different from baseline Arousal/Alertness: Awake/alert Orientation Level: Oriented X4 Attention: Selective Focused Attention: Appears intact Focused Attention Impairment: Functional basic;Verbal basic;Verbal complex;Functional complex Sustained Attention: Appears intact Selective Attention: Impaired Selective Attention Impairment: Verbal basic;Functional basic Memory: Impaired Memory Impairment: Storage deficit;Retrieval deficit;Decreased recall of new information Decreased Short Term Memory: Verbal basic;Functional basic Awareness: Impaired Awareness Impairment: Anticipatory impairment Problem Solving: Impaired Problem Solving Impairment: Verbal basic;Functional basic Executive Function: Reasoning;Organizing Reasoning: Impaired Reasoning Impairment: Verbal complex;Functional complex Organizing: Impaired Organizing Impairment: Verbal basic;Functional basic Safety/Judgment: Impaired Sensation Sensation Light Touch: Appears Intact Hot/Cold: Appears Intact Proprioception: Appears Intact Stereognosis: Appears Intact Coordination Gross Motor Movements are Fluid and Coordinated: No(Decreased LE coordination with shuffling gait especially on the left side) Fine Motor Movements are Fluid and Coordinated: Yes Motor  Motor Motor: Abnormal postural alignment and control Motor - Discharge Observations: Right lean in standing with shuffling gait on the right as well. Mobility  Bed Mobility Bed Mobility: Supine to Sit;Sit to Supine Supine to Sit: Supervision/Verbal cueing Sit to Supine: Supervision/Verbal cueing Transfers Sit to Stand: Supervision/Verbal cueing Stand to Sit: Supervision/Verbal cueing  Trunk/Postural Assessment  Cervical Assessment Cervical Assessment: Exceptions to WFL(forward  head) Thoracic Assessment Thoracic Assessment: Exceptions to WFL(thoracic kyphosis) Lumbar Assessment Lumbar Assessment: Exceptions to WFL(posterior pelvic tilt)  Balance Balance Balance Assessed: Yes Static Sitting Balance Static Sitting - Balance Support: Feet supported Static Sitting - Level of Assistance: 6: Modified independent (Device/Increase time) Dynamic Sitting Balance Dynamic Sitting - Balance Support: During functional activity Dynamic Sitting - Level of Assistance: 5: Stand by assistance Static Standing Balance Static Standing - Balance Support: During functional activity;Right upper extremity supported Static Standing - Level of Assistance: 5: Stand by assistance Dynamic Standing Balance Dynamic Standing - Balance Support: During functional activity Dynamic Standing - Level of Assistance: 5: Stand by assistance Extremity/Trunk Assessment RUE Assessment RUE Assessment: Within Functional Limits LUE Assessment LUE Assessment: Within Functional Limits   Dalin Caldera OTR/L 07/12/2019, 12:29 PM
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
## 2019-07-12 NOTE — Progress Notes (Signed)
Speech Language Pathology Discharge Summary  Patient Details  Name: Justin [name] MRN: [id number] Date of Birth: 1940-03-01  Today's Date: 07/12/2019 SLP Individual Time: 1330-1359 SLP Individual Time Calculation (min): 29 min   Skilled Therapeutic Interventions:  Pt was seen for skilled ST targeting cognitive goals. SLP facilitated session with repeat administration of [place]. Pt demonstrated an 8 point increase in his score (18/30) today (26 or > considered WFL). Pt's baseline anomia noted to influence some sections of the assessment. Most noteworthy impairments noted in memory (with both storage and retrieval deficits), as well as auditory comprehension, requiring cues. [name] arrived near end of session. Results reviewed with pt and wife. Pt left sitting in chair with alarm set and needs within reach. Continue per current plan of care.       Patient has met 3 of 4 long term goals.  Patient to discharge at overall Supervision;Min level.  Reasons goals not met: increased cueing required for memory compensatory strategies   Clinical Impression/Discharge Summary:   Pt made functional gains and met 3 out of 4 long term goals this admission. Pt currently requires Supervision-Min assist for basic cognitive tasks and will require 24/7 supervision. Pt has demonstrated improved sustained and selective attention, as well as initiation auditory comprehension, and intellectual awareness of deficits. However, given impairments in above listed areas as well as more severe deficits in short term memory with increased cues required for compensatory strategies/aids, recommend pt continue to receive skilled ST services upon discharge. Pt and family education is complete at this time.    Care Partner:  Caregiver Able to Provide Assistance: Yes  Type of Caregiver Assistance: Cognitive  Recommendation:  Home Health SLP;24 hour supervision/assistance  Rationale for SLP Follow Up: Maximize cognitive  function and independence;Reduce caregiver burden   Equipment: none   Reasons for discharge: Discharged from hospital   Patient/Family Agrees with Progress Made and Goals Achieved: Yes    Arbutus Leas 07/12/2019, 7:26 AM

## 2019-07-12 NOTE — Progress Notes (Signed)
Physical Therapy Discharge Summary  Patient Details  Name: Justin [name] MRN: [id number] Date of Birth: 07-Aug-1940  Today's Date: 07/12/2019 PT Individual Time: 5277-8242 PT Individual Time Calculation (min): 61 min    Patient has met 9 of 9 long term goals due to improved activity tolerance, improved balance, improved postural control, increased strength, ability to compensate for deficits, improved attention, improved awareness and improved coordination.  Patient to discharge at an ambulatory level using RW with Supervision.   Patient's care partner attended hands-on family education/training and is independent to provide the necessary physical and cognitive assistance at discharge.  All goals met.  Recommendation:  Patient will benefit from ongoing skilled PT services in home health setting to continue to advance safe functional mobility, address ongoing impairments in dual-task with mobility, gait mechanics, dynamic gait, dynamic standing balance, safety awareness, and minimize fall risk.  Equipment: Transport chair  Reasons for discharge: treatment goals met and discharge from hospital  Patient/family agrees with progress made and goals achieved: Yes  Skilled Therapeutic Interventions/Progress Updates:  Pt received sitting in w/c with his wife exiting upon therapist's arrival. [name] reports no questions/concerns regarding education and hands-on training and reports feeling prepared to assist pt at home. Pt agreeable to therapy session.  Transported to/from gym in w/c for time management and energy conservation. Sit<>stands with and without RW with supervision for safety during session. Gait training ~118f using RW with CGA for steadying - demonstrates improved gait pattern until ~229fthen starts to shuffle - took brief standing breaks to "reset" and improve gait mechanics but unable to sustain this for more than a few steps reverting back to shuffle pattern (R LE more impaired  than L). Ascended/descended 12 steps using L HR only (per home set-up) with CGA for steadying - 1x L knee buckled slightly with some pain but pt able to recover without increased assistance.  Performed the following exercises at counter support for pt's HEP: - repeated sit<>stands to/from chair with armrests x10 reps - lateral side stepping at counter support; down/back x4 - standing heel raises x10reps - forward foot taps at counter x12reps Provided pt with HEP printout with specific instructions to improve his technique/form. Pt demonstrated ability to pick up object from floor using RW with close supervision for safety. Transported back to room in w/c. Gait ~1580f2 to/from bathroom using RW with close supervision/intermittent CGA for safety. Standing with close supervision performed LB clothing management. Continent of bladder for timed toileting. Ambulated back to w/c. Pt reports no questions/concerns regarding discharge tomorrow. Left seated in w/c with needs in reach and seat belt alarm on.  PT Discharge Precautions/Restrictions Precautions Precautions: Fall;Other (comment) Precaution Comments: posterior and R lateral lean and h/o falls; HOH (using amplifier) Restrictions Weight Bearing Restrictions: No Pain Pain Assessment Pain Scale: 0-10 Pain Score: 0-No pain Perception  Perception Perception: Within Functional Limits Praxis Praxis: Impaired Praxis Impairment Details: Motor planning  Cognition Overall Cognitive Status: Impaired/Different from baseline Arousal/Alertness: Awake/alert Orientation Level: Oriented X4 Attention: Focused;Sustained Focused Attention: Appears intact Sustained Attention: Appears intact Safety/Judgment: Impaired Sensation Sensation Light Touch: Appears Intact Hot/Cold: Not tested Proprioception: Impaired by gross assessment(demonstrates impaired proprioceptive awareness or impaired dual-cognitive task of awareness of LEs in space during functional  tasks) Stereognosis: Not tested Coordination Gross Motor Movements are Fluid and Coordinated: No Coordination and Movement Description: impaired due to impaired balance, impaired motor planning and impaired dual-task Motor  Motor Motor: Abnormal postural alignment and control;Motor apraxia Motor - Discharge Observations: Right  posteiror lean in standing with shuffling gait (R LE more impaired)  Mobility Bed Mobility Bed Mobility: Supine to Sit;Sit to Supine Supine to Sit: Supervision/Verbal cueing Sit to Supine: Supervision/Verbal cueing Transfers Transfers: Sit to Stand;Stand to Sit;Stand Pivot Transfers Sit to Stand: Supervision/Verbal cueing Stand to Sit: Supervision/Verbal cueing Stand Pivot Transfers: Supervision/Verbal cueing Stand Pivot Transfer Details: Verbal cues for sequencing;Verbal cues for technique;Verbal cues for precautions/safety;Verbal cues for safe use of DME/AE;Verbal cues for gait pattern Transfer (Assistive device): Rolling walker Locomotion  Gait Ambulation: Yes Gait Assistance: Supervision/Verbal cueing;Contact Guard/Touching assist Gait Distance (Feet): 150 Feet Assistive device: Rolling walker Gait Assistance Details: Verbal cues for technique;Verbal cues for safe use of DME/AE;Verbal cues for gait pattern;Verbal cues for sequencing;Verbal cues for precautions/safety;Visual cues/gestures for sequencing Gait Gait: Yes Gait Pattern: Impaired Gait Pattern: Step-to pattern;Decreased stride length;Decreased stance time - left;Decreased step length - right;Decreased step length - left;Poor foot clearance - left;Poor foot clearance - right;Lateral trunk lean to right;Trunk flexed Gait velocity: decreased Stairs / Additional Locomotion Stairs: Yes Stairs Assistance: Contact Guard/Touching assist Stair Management Technique: One rail Left Number of Stairs: 12 Height of Stairs: 6 Ramp: Contact Guard/touching assist Curb: Surveyor, quantity Mobility: No  Trunk/Postural Assessment  Cervical Assessment Cervical Assessment: Exceptions to WFL(forward head) Thoracic Assessment Thoracic Assessment: Exceptions to WFL(rounded shoulders, kyphotic) Lumbar Assessment Lumbar Assessment: Exceptions to WFL(posterior pelvic tilt in sitting) Postural Control Postural Control: Deficits on evaluation Postural Limitations: decreased with mild R lateral trunk flexion in standing that worsens with fatigue though significantly improved since eval  Balance Balance Balance Assessed: Yes Static Sitting Balance Static Sitting - Level of Assistance: 6: Modified independent (Device/Increase time) Dynamic Sitting Balance Dynamic Sitting - Level of Assistance: 5: Stand by assistance Static Standing Balance Static Standing - Balance Support: During functional activity;Bilateral upper extremity supported Static Standing - Level of Assistance: 5: Stand by assistance Dynamic Standing Balance Dynamic Standing - Balance Support: During functional activity;Bilateral upper extremity supported Dynamic Standing - Level of Assistance: 5: Stand by assistance(or CGA) Extremity Assessment      RLE Assessment RLE Assessment: Within Functional Limits Active Range of Motion (AROM) Comments: WFL General Strength Comments: 5/5 demonstrated in sitting LLE Assessment LLE Assessment: Within Functional Limits Active Range of Motion (AROM) Comments: WFL General Strength Comments: 5/5 assessed in sitting    Tawana Scale, PT, DPT 07/12/2019, 3:32 PM

## 2019-07-13 LAB — GLUCOSE, CAPILLARY: Glucose-Capillary: 88 mg/dL (ref 70–99)

## 2019-07-13 MED ORDER — HYDROCERIN EX CREA: 1.0000 "application " | TOPICAL_CREAM | Freq: Two times a day (BID) | CUTANEOUS | 0 refills | Status: DC

## 2019-07-13 MED ORDER — GLIPIZIDE 5 MG PO TABS: 2.5000 mg | ORAL_TABLET | Freq: Every day | ORAL | 0 refills | Status: DC

## 2019-07-13 MED ORDER — METFORMIN HCL 500 MG PO TABS: 500.0000 mg | ORAL_TABLET | Freq: Two times a day (BID) | ORAL | Status: DC

## 2019-07-13 MED ORDER — DOCUSATE SODIUM 100 MG PO CAPS: 100.0000 mg | ORAL_CAPSULE | Freq: Three times a day (TID) | ORAL | 0 refills | Status: DC

## 2019-07-13 MED ORDER — AMLODIPINE BESYLATE 5 MG PO TABS: 2.5000 mg | ORAL_TABLET | Freq: Every day | ORAL | 0 refills | Status: DC

## 2019-07-13 NOTE — Progress Notes (Signed)
Inpatient Rehab Care Coordinator Discharge Note Inpatient Rehabilitation Care Coordinator  Discharge Note  The overall goal for the admission was met for:   Discharge location: Home with wife  Length of Stay: 15 days with discharge 07/13/19  Discharge activity level: Patient to discharge at an ambulatory level using RW with Supervision  Home/community participation:Limited Participation  Services provided included: MD, RD, PT, OT, SLP, RN, CM, TR, Pharmacy, Neuropsych and SW  Financial Services: Medicare  Follow-up services arranged: Home Health: PT, OT, SLP and Patient/Family has no preference for HH/DME agencies  Comments (or additional information): [facility] [phone]  Patient/Family verbalized understanding of follow-up arrangements: Yes   Patient's care partner attended hands-on family education/training and is independent to provide the necessary physical and cognitive assistance at discharge.  Individual responsible for coordination of the follow-up plan:  Wife:  Confirmed recommended DME obtained: transport chair obtained from church per wife. Margarito Liner 07/13/2019    Margarito Liner

## 2019-07-13 NOTE — Plan of Care (Signed)
Pt to discharge home with spouse, equipment at bedside, discharge instructions provided by P.Love, PA-c,

## 2019-07-13 NOTE — Discharge Instructions (Signed)
Inpatient Rehab Discharge Instructions  CANELO GUTMAN Discharge date and time: 07/13/19   Activities/Precautions/ Functional Status: Activity: no lifting, driving, or strenuous exercise till cleared by MD Diet: cardiac diet and diabetic diet Wound Care: none needed   Functional status:  ___ No restrictions     ___ Walk up steps independently _X__ 24/7 supervision/assistance   ___ Walk up steps with assistance ___ Intermittent supervision/assistance  ___ Bathe/dress independently ___ Walk with walker     _X__ Bathe/dress with assistance ___ Walk Independently    ___ Shower independently ___ Walk with assistance    ___ Shower with assistance _X__ No alcohol     ___ Return to work/school ________  COMMUNITY REFERRALS UPON DISCHARGE:    Home Health:   PT    OT   ST                     Agency: [facility] Phone: [phone]   Medical Equipment/Items Ordered: Transport Chair                                                 Agency/Supplier: [employer]   Special Instructions: 1. Monitor blood sugar before meals and at bedtime. If blood sugars are running over 120--increase glucotrol to  5 mg daily and monitor for 3-4 days. If they continue to be high and intake is good--resume home dose of 10 mg daily.    STROKE/TIA DISCHARGE INSTRUCTIONS SMOKING Cigarette smoking nearly doubles your risk of having a stroke & is the single most alterable risk factor  If you smoke or have smoked in the last 12 months, you are advised to quit smoking for your health.  Most of the excess cardiovascular risk related to smoking disappears within a year of stopping.  Ask you doctor about anti-smoking medications  [place] Quit Line: 1-800-QUIT NOW  Free Smoking Cessation Classes [phone]  CHOLESTEROL Know your levels; limit fat & cholesterol in your diet  Lipid Panel     Component Value Date/Time   CHOL 103 06/25/2019 0415   TRIG 136 06/25/2019 0415   HDL 28 (L) 06/25/2019 0415   CHOLHDL 3.7 06/25/2019 0415    VLDL 27 06/25/2019 0415   LDLCALC 48 06/25/2019 0415      Many patients benefit from treatment even if their cholesterol is at goal.  Goal: Total Cholesterol (CHOL) less than 160  Goal:  Triglycerides (TRIG) less than 150  Goal:  HDL greater than 40  Goal:  LDL (LDLCALC) less than 100   BLOOD PRESSURE American Stroke Association blood pressure target is less that 120/80 mm/Hg  Your discharge blood pressure is:  BP: (!) 119/55  Monitor your blood pressure  Limit your salt and alcohol intake  Many individuals will require more than one medication for high blood pressure  DIABETES (A1c is a blood sugar average for last 3 months) Goal HGBA1c is under 7% (HBGA1c is blood sugar average for last 3 months)  Diabetes:     Lab Results  Component Value Date   HGBA1C 7.2 (H) 06/25/2019     Your HGBA1c can be lowered with medications, healthy diet, and exercise.  Check your blood sugar as directed by your physician  Call your physician if you experience unexplained or low blood sugars.  PHYSICAL ACTIVITY/REHABILITATION Goal is 30 minutes at least 4 days per week  Activity: No driving, Therapies: see above Return to work: N/A  Activity decreases your risk of heart attack and stroke and makes your heart stronger.  It helps control your weight and blood pressure; helps you relax and can improve your mood.  Participate in a regular exercise program.  Talk with your doctor about the best form of exercise for you (dancing, walking, swimming, cycling).  DIET/WEIGHT Goal is to maintain a healthy weight  Your discharge diet is:  Diet Order            Diet heart healthy/carb modified Room service appropriate? Yes with Assist; Fluid consistency: Thin  Diet effective now             liquids Your height is:  Height: 6' (182.9 cm) Your current weight is: 225 lbs Your Body Mass Index (BMI) is:  BMI (Calculated): 30.55  Following the type of diet specifically designed for you will  help prevent another stroke.  Your goal weight is:  184 lbs  Your goal Body Mass Index (BMI) is 19-24.  Healthy food habits can help reduce 3 risk factors for stroke:  High cholesterol, hypertension, and excess weight.  RESOURCES Stroke/Support Group:  Call [phone]   STROKE EDUCATION PROVIDED/REVIEWED AND GIVEN TO PATIENT Stroke warning signs and symptoms How to activate emergency medical system (call 911). Medications prescribed at discharge. Need for follow-up after discharge. Personal risk factors for stroke. Pneumonia vaccine given:  Flu vaccine given:  My questions have been answered, the writing is legible, and I understand these instructions.  I will adhere to these goals & educational materials that have been provided to me after my discharge from the hospital.     My questions have been answered and I understand these instructions. I will adhere to these goals and the provided educational materials after my discharge from the hospital.  Patient/Caregiver Signature _______________________________ Date __________  Clinician Signature _______________________________________ Date __________  Please bring this form and your medication list with you to all your follow-up doctor's appointments.
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
## 2019-07-13 NOTE — Discharge Summary (Signed)
Physician Discharge Summary  Patient ID: Justin [name] MRN: [id number] DOB/AGE: [birth year] 79 y.o.  Admit date: 06/28/2019 Discharge date: 07/13/2019  Discharge Diagnoses:  Principal Problem:   Acute right MCA stroke [facility]) Active Problems:   Major depressive disorder, recurrent episode, moderate (HCC)   Gait disorder   Dyslipidemia   Benign essential HTN   Diabetes mellitus type 2 in obese [facility])   Discharged Condition: stable   Significant Diagnostic Studies: N/a   Labs:  Basic Metabolic Panel: BMP Latest Ref Rng & Units 07/09/2019 07/02/2019 06/29/2019  Glucose 70 - 99 mg/dL 108(H) 111(H) 135(H)  BUN 8 - 23 mg/dL 32(H) 34(H) 25(H)  Creatinine 0.61 - 1.24 mg/dL 1.11 1.03 1.02  Sodium 135 - 145 mmol/L 135 135 136  Potassium 3.5 - 5.1 mmol/L 4.0 3.7 3.9  Chloride 98 - 111 mmol/L 97(L) 98 101  CO2 22 - 32 mmol/L 26 24 25   Calcium 8.9 - 10.3 mg/dL 9.5 9.5 8.9    CBC: CBC Latest Ref Rng & Units 07/09/2019 07/02/2019 06/29/2019  WBC 4.0 - 10.5 K/uL 9.9 9.0 9.3  Hemoglobin 13.0 - 17.0 g/dL 15.5 15.7 15.5  Hematocrit 39.0 - 52.0 % 45.8 46.8 46.6  Platelets 150 - 400 K/uL 305 291 295    CBG: Recent Labs  Lab 07/12/19 0625 07/12/19 1110 07/12/19 1640 07/12/19 2110 07/13/19 0610  GLUCAP 80 109* 105* 161* 88    Brief HPI:   Justin [name] is a 79 y.o. male with history of HTN, T2DM, CHI, lumbar radiculopathy with gait disorder, multiple CVAs who was admitted on 06/24/19 with reports of speech difficulty, weakness with multiple falls in week prior to admission.  MRI brain done revealing 2 small acute infarcts in right MCA territory and advanced chronic ischemic changes but widespread atrophy advanced for age.  CTA head/neck was negative for high-grade occlusion and carotid Dopplers were negative for ICA stenosis.  Dr. Erlinda Hong felt the stroke was due to small vessel disease and DAPT x3 months recommended followed by Plavix alone.  He also recommended 30-day event monitor to rule out A. fib  given 2 separate stroke locations.  Patient resultant balance deficits, right lateral lean, impaired cognition with decrease in activity tolerance.  CIR recommended due to functional decline   Hospital Course: JUANYE ZIOBRO was admitted to rehab 06/28/2019 for inpatient therapies to consist of PT, ST and OT at least three hours five days a week. Past admission physiatrist, therapy team and rehab RN have worked together to provide customized collaborative inpatient rehab. Blood pressures were monitored on TID basis and have been stable.  His blood sugars have been monitored with ac/hs CBG checks and SSI was use prn for tighter BS control.  Metformin and glipizide were resumed but he  has had issues with hypoglycemia therefore glipizide was decreased to 2.5 mg/day.  Wife advised to continue monitoring blood sugar achs basis and slowly titrating glucotrol to home dose in 1-2 weeks if blood sugars start trending over 120.  He is tolerating DAPT with serial CBC showing H&H and platelets to be stable.  Cardiology was contacted regarding patient's discharge and consult sent to office for 30-day event monitor.  He is continent of bowel and bladder. He has made gains during rehab stay and is currently at supervision level.  He will continue to receive follow up HHPT,HHOT and HHST by [facility] after discharge.   Rehab course: During patient's stay in rehab weekly team conferences were held to monitor  patient's progress, set goals and discuss barriers to discharge. At admission, patient required min assist with ADL tasks and mod assist with mobility. He exhibited severe cognitive linguistic impairments with MOCA score 10/20.  He  has had improvement in activity tolerance, balance, postural control as well as ability to compensate for deficits.  He is able to complete ADL tasks with supervision for safety. He is able to perform sit to stand transfers with without rolling walker with supervision.  He is  ambulating 150 feet with use of rolling walker and contact-guard assist.  Steady.  He has shown improvement in gait pattern but does tend to start shuffling after about 20 feet.  He is able to climb 12 steps with contact-guard assist.  Patient's baseline anomia influences sections of assessment.  He has had 8 points increase in MOCA score to 18/20.  He requires supervision to min assist for basic cognitive tasks.  He is showing improvement in sustained attention and selective attention as well as auditory comprehension.  Family education completed regarding all aspects of care, cognitive assistance and mobility.    Disposition: Home  Diet: Heart Healthy/Carb Modified.   Special Instructions: 1.  No driving or strenuous activity till cleared by MD. 2.  Monitor blood sugars 4 times a day and record. 3. Increase fluid intake. Recheck BMET in 1-2 weeks.    Discharge Instructions    Ambulatory referral to Cardiology   Complete by: As directed    30 day event monitor   Ambulatory referral to Physical Medicine Rehab   Complete by: As directed    1-2 weeks TC appointment     Allergies as of 07/13/2019   No Known Allergies     Medication List    STOP taking these medications   mirabegron ER 50 MG Tb24 tablet Commonly known as: MYRBETRIQ   PRESCRIPTION MEDICATION     TAKE these medications   amLODipine 5 MG tablet Commonly known as: NORVASC Take 0.5 tablets (2.5 mg total) by mouth daily. What changed: how much to take   aspirin 81 MG EC tablet Take 1 tablet (81 mg total) by mouth daily.   atorvastatin 80 MG tablet Commonly known as: LIPITOR TAKE 1 TABLET EVERY DAY   clopidogrel 75 MG tablet Commonly known as: PLAVIX Take 1 tablet (75 mg total) by mouth daily.   [employer] VITAMIN B12 1000 MCG tablet Generic drug: cyanocobalamin Take 1,000 mcg by mouth daily.   docusate sodium 100 MG capsule Commonly known as: COLACE Take 1 capsule (100 mg total) by mouth 3 (three) times  daily.   fenofibrate 160 MG tablet Take 160 mg by mouth daily.   Fish Oil 1000 MG Caps Take 1,000 mg by mouth daily with breakfast.   furosemide 20 MG tablet Commonly known as: LASIX Take 20 mg by mouth daily.   glipiZIDE 5 MG tablet Commonly known as: GLUCOTROL Take 0.5 tablets (2.5 mg total) by mouth daily before breakfast. Start taking on: Jul 14, 2019 What changed:   medication strength  how much to take  when to take this   hydrocerin Crea Apply 1 application topically 2 (two) times daily.   hydrochlorothiazide 25 MG tablet Commonly known as: HYDRODIURIL Take 1 tablet (25 mg total) by mouth daily.   losartan 100 MG tablet Commonly known as: COZAAR Take 100 mg by mouth daily.   metFORMIN 500 MG tablet Commonly known as: GLUCOPHAGE Take 1 tablet (500 mg total) by mouth 2 (two) times daily with a meal.  What changed: how much to take   Ozempic (0.25 or 0.5 MG/DOSE) 2 MG/1.5ML Sopn Generic drug: Semaglutide(0.25 or 0.5MG /DOS) Inject 0.25 mg into the skin once a week.   polyvinyl alcohol 1.4 % ophthalmic solution Commonly known as: LIQUIFILM TEARS Place 1 drop into both eyes 4 (four) times daily as needed for dry eyes.   senna-docusate 8.6-50 MG tablet Commonly known as: Senokot-S Take 2 tablets by mouth at bedtime. Notes to patient: Over the counter   sertraline 25 MG tablet Commonly known as: Zoloft Take 1 tablet (25 mg total) by mouth daily.   Vitamin D-1000 Max St 25 MCG (1000 UT) tablet Generic drug: Cholecalciferol Take 1,000 Units by mouth daily.      Follow-up Information    Kirsteins, Luanna Salk, MD Follow up.   Specialty: Physical Medicine and Rehabilitation Why: Office will call you with follow up appointment Contact information: [place] [address] [phone]        Chesley Noon, MD. Call on 07/16/2019.   Specialty: Family Medicine Why: for post hospital follow up Contact information: [place] [address] [phone]        [facility]. Call.   Specialty: Neurology Why: for stroke follow up Contact information: [place] [phone]       [place] Office Follow up.   Specialty: Cardiology Why: Will call with information on 30 day event monitor.  Contact information: [address], [address] [place]       Gerome Sam, MD Follow up.   Specialty: Internal Medicine Contact information: [place] [address] [phone]           Signed: Bary Leriche 07/13/2019, 11:08 AM

## 2019-07-16 ENCOUNTER — Emergency Department (HOSPITAL_COMMUNITY): Payer: Medicare HMO

## 2019-07-16 ENCOUNTER — Inpatient Hospital Stay (HOSPITAL_COMMUNITY)
Admission: EM | Admit: 2019-07-16 | Discharge: 2019-07-18 | DRG: 065 | Disposition: A | Discharge status: Rehabilitation Facility | Payer: Medicare HMO | Attending: Internal Medicine | Admitting: Internal Medicine

## 2019-07-16 ENCOUNTER — Other Ambulatory Visit: Payer: Self-pay | Admitting: Physical Medicine and Rehabilitation

## 2019-07-16 ENCOUNTER — Other Ambulatory Visit: Payer: Self-pay

## 2019-07-16 DIAGNOSIS — Z9079 Acquired absence of other genital organ(s): Secondary | ICD-10-CM

## 2019-07-16 DIAGNOSIS — Z8673 Personal history of transient ischemic attack (TIA), and cerebral infarction without residual deficits: Secondary | ICD-10-CM

## 2019-07-16 DIAGNOSIS — I639 Cerebral infarction, unspecified: Secondary | ICD-10-CM | POA: Diagnosis present

## 2019-07-16 DIAGNOSIS — I63512 Cerebral infarction due to unspecified occlusion or stenosis of left middle cerebral artery: Principal | ICD-10-CM | POA: Diagnosis present

## 2019-07-16 DIAGNOSIS — Z825 Family history of asthma and other chronic lower respiratory diseases: Secondary | ICD-10-CM | POA: Diagnosis not present

## 2019-07-16 DIAGNOSIS — E785 Hyperlipidemia, unspecified: Secondary | ICD-10-CM | POA: Diagnosis present

## 2019-07-16 DIAGNOSIS — Z833 Family history of diabetes mellitus: Secondary | ICD-10-CM | POA: Diagnosis not present

## 2019-07-16 DIAGNOSIS — G8191 Hemiplegia, unspecified affecting right dominant side: Secondary | ICD-10-CM | POA: Diagnosis present

## 2019-07-16 DIAGNOSIS — G4733 Obstructive sleep apnea (adult) (pediatric): Secondary | ICD-10-CM

## 2019-07-16 DIAGNOSIS — Z66 Do not resuscitate: Secondary | ICD-10-CM | POA: Diagnosis not present

## 2019-07-16 DIAGNOSIS — R29701 NIHSS score 1: Secondary | ICD-10-CM | POA: Diagnosis present

## 2019-07-16 DIAGNOSIS — Z7984 Long term (current) use of oral hypoglycemic drugs: Secondary | ICD-10-CM

## 2019-07-16 DIAGNOSIS — Z8249 Family history of ischemic heart disease and other diseases of the circulatory system: Secondary | ICD-10-CM

## 2019-07-16 DIAGNOSIS — E1165 Type 2 diabetes mellitus with hyperglycemia: Secondary | ICD-10-CM | POA: Diagnosis present

## 2019-07-16 DIAGNOSIS — Z8546 Personal history of malignant neoplasm of prostate: Secondary | ICD-10-CM | POA: Diagnosis not present

## 2019-07-16 DIAGNOSIS — E7800 Pure hypercholesterolemia, unspecified: Secondary | ICD-10-CM | POA: Diagnosis present

## 2019-07-16 DIAGNOSIS — R7309 Other abnormal glucose: Secondary | ICD-10-CM | POA: Diagnosis not present

## 2019-07-16 DIAGNOSIS — Z79899 Other long term (current) drug therapy: Secondary | ICD-10-CM

## 2019-07-16 DIAGNOSIS — Z7902 Long term (current) use of antithrombotics/antiplatelets: Secondary | ICD-10-CM

## 2019-07-16 DIAGNOSIS — I10 Essential (primary) hypertension: Secondary | ICD-10-CM | POA: Diagnosis present

## 2019-07-16 DIAGNOSIS — E669 Obesity, unspecified: Secondary | ICD-10-CM | POA: Diagnosis not present

## 2019-07-16 DIAGNOSIS — Z7982 Long term (current) use of aspirin: Secondary | ICD-10-CM

## 2019-07-16 DIAGNOSIS — Z20822 Contact with and (suspected) exposure to covid-19: Secondary | ICD-10-CM | POA: Diagnosis present

## 2019-07-16 DIAGNOSIS — I63412 Cerebral infarction due to embolism of left middle cerebral artery: Secondary | ICD-10-CM | POA: Diagnosis not present

## 2019-07-16 DIAGNOSIS — I4510 Unspecified right bundle-branch block: Secondary | ICD-10-CM | POA: Diagnosis present

## 2019-07-16 DIAGNOSIS — I672 Cerebral atherosclerosis: Secondary | ICD-10-CM | POA: Diagnosis present

## 2019-07-16 DIAGNOSIS — E1169 Type 2 diabetes mellitus with other specified complication: Secondary | ICD-10-CM | POA: Diagnosis not present

## 2019-07-16 LAB — CBC WITH DIFFERENTIAL/PLATELET
Abs Immature Granulocytes: 0.04 10*3/uL (ref 0.00–0.07)
Basophils Absolute: 0 10*3/uL (ref 0.0–0.1)
Basophils Relative: 0 %
Eosinophils Absolute: 0.2 10*3/uL (ref 0.0–0.5)
Eosinophils Relative: 1 %
HCT: 45.6 % (ref 39.0–52.0)
Hemoglobin: 15 g/dL (ref 13.0–17.0)
Immature Granulocytes: 0 %
Lymphocytes Relative: 19 %
Lymphs Abs: 2.1 10*3/uL (ref 0.7–4.0)
MCH: 27.8 pg (ref 26.0–34.0)
MCHC: 32.9 g/dL (ref 30.0–36.0)
MCV: 84.6 fL (ref 80.0–100.0)
Monocytes Absolute: 0.8 10*3/uL (ref 0.1–1.0)
Monocytes Relative: 7 %
Neutro Abs: 7.8 10*3/uL — ABNORMAL HIGH (ref 1.7–7.7)
Neutrophils Relative %: 73 %
Platelets: 267 10*3/uL (ref 150–400)
RBC: 5.39 MIL/uL (ref 4.22–5.81)
RDW: 12.6 % (ref 11.5–15.5)
WBC: 11 10*3/uL — ABNORMAL HIGH (ref 4.0–10.5)
nRBC: 0 % (ref 0.0–0.2)

## 2019-07-16 LAB — I-STAT CHEM 8, ED
BUN: 26 mg/dL — ABNORMAL HIGH (ref 8–23)
Calcium, Ion: 1.12 mmol/L — ABNORMAL LOW (ref 1.15–1.40)
Chloride: 101 mmol/L (ref 98–111)
Creatinine, Ser: 0.9 mg/dL (ref 0.61–1.24)
Glucose, Bld: 92 mg/dL (ref 70–99)
HCT: 43 % (ref 39.0–52.0)
Hemoglobin: 14.6 g/dL (ref 13.0–17.0)
Potassium: 4.1 mmol/L (ref 3.5–5.1)
Sodium: 138 mmol/L (ref 135–145)
TCO2: 30 mmol/L (ref 22–32)

## 2019-07-16 LAB — COMPREHENSIVE METABOLIC PANEL
ALT: 22 U/L (ref 0–44)
AST: 19 U/L (ref 15–41)
Albumin: 3.5 g/dL (ref 3.5–5.0)
Alkaline Phosphatase: 36 U/L — ABNORMAL LOW (ref 38–126)
Anion gap: 11 (ref 5–15)
BUN: 20 mg/dL (ref 8–23)
CO2: 26 mmol/L (ref 22–32)
Calcium: 9.3 mg/dL (ref 8.9–10.3)
Chloride: 103 mmol/L (ref 98–111)
Creatinine, Ser: 0.86 mg/dL (ref 0.61–1.24)
GFR calc Af Amer: >60 mL/min (ref >60)
GFR calc non Af Amer: >60 mL/min (ref >60)
Glucose, Bld: 95 mg/dL (ref 70–99)
Potassium: 4.1 mmol/L (ref 3.5–5.1)
Sodium: 140 mmol/L (ref 135–145)
Total Bilirubin: 0.8 mg/dL (ref 0.3–1.2)
Total Protein: 6.3 g/dL — ABNORMAL LOW (ref 6.5–8.1)

## 2019-07-16 LAB — PROTIME-INR
INR: 1.1 (ref 0.8–1.2)
Prothrombin Time: 13.5 seconds (ref 11.4–15.2)

## 2019-07-16 LAB — CBG MONITORING, ED
Glucose-Capillary: 118 mg/dL — ABNORMAL HIGH (ref 70–99)
Glucose-Capillary: 118 mg/dL — ABNORMAL HIGH (ref 70–99)

## 2019-07-16 LAB — ETHANOL: Alcohol, Ethyl (B): <10 mg/dL (ref <10)

## 2019-07-16 LAB — APTT: aPTT: 21 seconds — ABNORMAL LOW (ref 24–36)

## 2019-07-16 LAB — GLUCOSE, CAPILLARY
Glucose-Capillary: 68 mg/dL — ABNORMAL LOW (ref 70–99)
Glucose-Capillary: 63 mg/dL — ABNORMAL LOW (ref 70–99)
Glucose-Capillary: 91 mg/dL (ref 70–99)

## 2019-07-16 LAB — SARS CORONAVIRUS 2 BY RT PCR (HOSPITAL ORDER, PERFORMED IN ~~LOC~~ HOSPITAL LAB): SARS Coronavirus 2: NEGATIVE

## 2019-07-16 MED ORDER — SERTRALINE HCL 50 MG PO TABS
25.0000 mg | ORAL_TABLET | Freq: Every day | ORAL | Status: DC
  Administered 2019-07-17 – 2019-07-18 (×2): 25 mg via ORAL
  Filled 2019-07-16 (×2): qty 1

## 2019-07-16 MED ORDER — ATORVASTATIN CALCIUM 80 MG PO TABS
80.0000 mg | ORAL_TABLET | Freq: Every evening | ORAL | Status: DC
  Administered 2019-07-16 – 2019-07-17 (×2): 80 mg via ORAL
  Filled 2019-07-16 (×2): qty 1

## 2019-07-16 MED ORDER — CLOPIDOGREL BISULFATE 75 MG PO TABS
75.0000 mg | ORAL_TABLET | Freq: Every day | ORAL | Status: DC
  Administered 2019-07-17 – 2019-07-18 (×2): 75 mg via ORAL
  Filled 2019-07-16 (×2): qty 1

## 2019-07-16 MED ORDER — ACETAMINOPHEN 650 MG RE SUPP: 650.0000 mg | Freq: Four times a day (QID) | RECTAL | Status: DC | PRN

## 2019-07-16 MED ORDER — SENNA 8.6 MG PO TABS
1.0000 | ORAL_TABLET | Freq: Two times a day (BID) | ORAL | Status: DC
  Administered 2019-07-16 – 2019-07-18 (×4): 8.6 mg via ORAL
  Filled 2019-07-16 (×4): qty 1

## 2019-07-16 MED ORDER — ASPIRIN EC 81 MG PO TBEC
81.0000 mg | DELAYED_RELEASE_TABLET | Freq: Every day | ORAL | Status: DC
  Administered 2019-07-17 – 2019-07-18 (×2): 81 mg via ORAL
  Filled 2019-07-16 (×2): qty 1

## 2019-07-16 MED ORDER — FENOFIBRATE 160 MG PO TABS
160.0000 mg | ORAL_TABLET | Freq: Every day | ORAL | Status: DC
  Administered 2019-07-17 – 2019-07-18 (×2): 160 mg via ORAL
  Filled 2019-07-16 (×2): qty 1

## 2019-07-16 MED ORDER — ENOXAPARIN SODIUM 40 MG/0.4ML ~~LOC~~ SOLN
40.0000 mg | SUBCUTANEOUS | Status: DC
  Administered 2019-07-16 – 2019-07-17 (×2): 40 mg via SUBCUTANEOUS
  Filled 2019-07-16 (×2): qty 0.4

## 2019-07-16 MED ORDER — ACETAMINOPHEN 325 MG PO TABS: 650.0000 mg | ORAL_TABLET | Freq: Four times a day (QID) | ORAL | Status: DC | PRN

## 2019-07-16 MED ORDER — INSULIN ASPART 100 UNIT/ML ~~LOC~~ SOLN
0.0000 [IU] | Freq: Three times a day (TID) | SUBCUTANEOUS | Status: DC
  Administered 2019-07-17 (×2): 3 [IU] via SUBCUTANEOUS

## 2019-07-16 NOTE — Progress Notes (Signed)
CBG 68, treated, 68 treated, now 91. Steen informed.. Pt asymptomatic

## 2019-07-16 NOTE — Consult Note (Addendum)
Neurology Consultation  Reason for Consult: Stroke Referring Physician: Melina Copa  CC: Right-sided numbness tingling and weakness with stroke found on MRI  History is obtained from: Patient and chart  HPI: Justin [name] is a 79 y.o. male with history of stroke, hypertension, hypercholesterolemia, diabetes, depression and intracranial hemorrhage.  The patient was admitted on 10/03/2018 for a acute MCA stroke on the left.  Patient also had MRI on 06/24/2019 which did show there were 2 small acute infarcts within the right MCA territory at the centrum semiovale and posterior corona radiata.  He did go to rehab and when he returned home less than a week ago he had no deficits.  Patient says he went to bed last night at 10 PM feeling normal.  When he awoke this morning he apparently had difficulty putting weight on his leg and also felt as though his right leg was felt decreased sensation and difficulty using his right arm.  At this point he still feels as though some of the symptoms have remained.  Of note very minimal information could be obtained from the patient.  Majority of the information was obtained from the chart.  ED course  Relevant labs include -CBC shows white blood cell count of 11  Chart review  -Left CR infarct likely due to small vessel disease source. -Chronic left basal ganglia encephalomalacia -MRA of neck and head showed mild to moderate stenosis of the right P1, bilateral A2 and high-grade left M2 stenosis -2D echo showed EF of 60 to 65% -LDL 48 -HbA1c 6.9 -Patient was placed on dual antiplatelet therapy for 3 months and then to be on Plavix alone.  Most recently patient has had a stroke work-up including echocardiogram on 06/25/2019 which showed a left ventricular ejection fraction estimated to be 70 to 75%. MRI on 06/24/2019 which did show there were 2 small acute infarcts within the right MCA territory at the centrum semiovale and posterior corona radiata.  Carotid Doppler showing  bilateral 1-39% stenosis.  LDL 48, HbA1c 7.2.  At that time was recommended to have a 30-day cardiac event monitoring to rule out atrial fibrillation given 2 separate stroke locations.  LKW: 2200 hrs. on 07/15/2019 tpa given?: no, out of window Premorbid modified Rankin scale (mRS): 1 NIH stroke score of 1 for dysmetria with right finger-nose   Past Medical History:  Diagnosis Date  . Brain bleed ([place])   . Cancer [facility])    prostate  . 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 Depression   . Diabetes mellitus without complication ([place])   . High cholesterol   . Hypertension   . Stroke [facility])     Family History  Problem Relation Age of Onset  . CAD Mother   . Diabetes Mother   . Emphysema Father   . Diabetes Sister   . Diabetes Brother   . Diabetes Sister   . Diabetes Brother   . Diabetes Mellitus II Neg Hx    Social History:   reports that he has never smoked. He has never used smokeless tobacco. He reports that he does not drink alcohol or use drugs.  Medications No current facility-administered medications for this encounter.  Current Outpatient Medications:  .  amLODipine (NORVASC) 5 MG tablet, Take 0.5 tablets (2.5 mg total) by mouth daily., Disp: 90 tablet, Rfl: 0 .  aspirin EC 81 MG EC tablet, Take 1 tablet (81 mg total) by mouth daily., Disp: 90 tablet, Rfl: 0 .  atorvastatin (  LIPITOR) 80 MG tablet, TAKE 1 TABLET EVERY DAY (Patient taking differently: Take 80 mg by mouth daily. ), Disp: 90 tablet, Rfl: 0 .  Cholecalciferol (VITAMIN D-1000 MAX ST) 1000 units tablet, Take 1,000 Units by mouth daily., Disp: , Rfl:  .  clopidogrel (PLAVIX) 75 MG tablet, Take 1 tablet (75 mg total) by mouth daily., Disp: 90 tablet, Rfl: 4 .  cyanocobalamin ([employer]S VITAMIN B12) 1000 MCG tablet, Take 1,000 mcg by mouth daily. , Disp: , Rfl:  .  docusate sodium (COLACE) 100 MG capsule, Take 1 capsule (100 mg total) by mouth 3 (three) times daily., Disp: 90 capsule, Rfl: 0 .  fenofibrate 160  MG tablet, Take 160 mg by mouth daily., Disp: , Rfl:  .  furosemide (LASIX) 20 MG tablet, Take 20 mg by mouth daily. , Disp: , Rfl:  .  glipiZIDE (GLUCOTROL) 5 MG tablet, Take 0.5 tablets (2.5 mg total) by mouth daily before breakfast., Disp: 30 tablet, Rfl: 0 .  hydrocerin (EUCERIN) CREA, Apply 1 application topically 2 (two) times daily., Disp: 454 g, Rfl: 0 .  hydrochlorothiazide (HYDRODIURIL) 25 MG tablet, Take 1 tablet (25 mg total) by mouth daily., Disp:  , Rfl:  .  losartan (COZAAR) 100 MG tablet, Take 100 mg by mouth daily., Disp: , Rfl:  .  metFORMIN (GLUCOPHAGE) 500 MG tablet, Take 1 tablet (500 mg total) by mouth 2 (two) times daily with a meal., Disp: , Rfl:  .  Omega-3 Fatty Acids (FISH OIL) 1000 MG CAPS, Take 1,000 mg by mouth daily with breakfast., Disp: , Rfl:  .  polyvinyl alcohol (LIQUIFILM TEARS) 1.4 % ophthalmic solution, Place 1 drop into both eyes 4 (four) times daily as needed for dry eyes., Disp: , Rfl:  .  Semaglutide (OZEMPIC) 0.25 or 0.5 MG/DOSE SOPN, Inject 0.25 mg into the skin once a week., Disp: , Rfl:  .  senna-docusate (SENOKOT-S) 8.6-50 MG tablet, Take 2 tablets by mouth at bedtime., Disp:  , Rfl:  .  sertraline (ZOLOFT) 25 MG tablet, Take 1 tablet (25 mg total) by mouth daily., Disp: 90 tablet, Rfl: 1  ROS:  .   General ROS: negative for - chills, fatigue, fever, night sweats, weight gain or weight loss Psychological ROS: negative for - behavioral disorder, hallucinations, memory difficulties, mood swings or suicidal ideation Ophthalmic ROS: negative for - blurry vision, double vision, eye pain or loss of vision ENT ROS: negative for - epistaxis, nasal discharge, oral lesions, sore throat, tinnitus or vertigo Respiratory ROS: negative for - cough, hemoptysis, shortness of breath or wheezing Cardiovascular ROS: negative for - chest pain, dyspnea on exertion, edema or irregular heartbeat Gastrointestinal ROS: negative for - abdominal pain, diarrhea, hematemesis,  nausea/vomiting or stool incontinence Genito-Urinary ROS: negative for - dysuria, hematuria, incontinence or urinary frequency/urgency Musculoskeletal ROS: Positive for -  muscular weakness Neurological ROS: as noted in HPI Dermatological ROS: negative for rash and skin lesion changes  Exam: Current vital signs: BP (!) 130/117   Pulse 81   Resp 14   Ht 6' (1.829 m)   Wt 99.8 kg   SpO2 98%   BMI 29.84 kg/m  Vital signs in last 24 hours: Pulse Rate:  [81] 81 (05/24 1255) Resp:  [14] 14 (05/24 1255) BP: (130)/(117) 130/117 (05/24 1255) SpO2:  [98 %] 98 % (05/24 1255) Weight:  [99.8 kg] 99.8 kg (05/24 1252)   Constitutional: Appears well-developed and well-nourished.  Psych: Affect appropriate to situation Eyes: No scleral injection HENT: No OP  obstrucion Head: Normocephalic.  Cardiovascular: Normal rate and regular rhythm.  Respiratory: Effort normal, non-labored breathing GI: Soft.  No distension. There is no tenderness.  Skin: WDI  Neuro: Mental Status: Patient is awake, alert, oriented to person, place, month, year, and situation.  Bradyphrenia.  No dysarthria or aphasia.  Intact naming, repeating, comprehension. Able to follow simple commands Cranial Nerves: II: Visual Fields are full.  III,IV, VI: EOMI without ptosis or diploplia. Pupils equal, round and reactive to light V: Facial sensation is symmetric to temperature VII: Facial movement is symmetric.  VIII: hearing is intact to voice X: Palat elevates symmetrically XI: Shoulder shrug is symmetric. XII: tongue is midline without atrophy or fasciculations.  Motor: Tone is normal. Bulk is normal. 5/5 strength was present throughout with the exception of his right upper extremity which is 4/5.  He did have a significant tremor with his left arm when held out in extension.  No asterixis was noted.  No drift were noted in the upper or lower extremities. Sensory: Sensation is symmetric to light touch and temperature in  the arms and legs. Deep Tendon Reflexes: 2+ and symmetric in the biceps and patellae.  Plantars: Toes are downgoing bilaterally.  Cerebellar: Right finger-nose-finger showed dysmetria.  Otherwise normal.  No significant ataxia with heel-to-shin bilaterally  Labs I have reviewed labs in epic and the results pertinent to this consultation are:   CBC    Component Value Date/Time   WBC 9.9 07/09/2019 0552   RBC 5.62 07/09/2019 0552   HGB 14.6 07/16/2019 1416   HCT 43.0 07/16/2019 1416   PLT 305 07/09/2019 0552   MCV 81.5 07/09/2019 0552   MCH 27.6 07/09/2019 0552   MCHC 33.8 07/09/2019 0552   RDW 12.6 07/09/2019 0552   LYMPHSABS 2.5 06/29/2019 0636   MONOABS 0.8 06/29/2019 0636   EOSABS 0.6 (H) 06/29/2019 0636   BASOSABS 0.1 06/29/2019 0636    CMP     Component Value Date/Time   NA 138 07/16/2019 1416   K 4.1 07/16/2019 1416   CL 101 07/16/2019 1416   CO2 26 07/09/2019 0552   GLUCOSE 92 07/16/2019 1416   BUN 26 (H) 07/16/2019 1416   CREATININE 0.90 07/16/2019 1416   CALCIUM 9.5 07/09/2019 0552   PROT 6.3 (L) 06/29/2019 0636   ALBUMIN 3.4 (L) 06/29/2019 0636   AST 20 06/29/2019 0636   ALT 21 06/29/2019 0636   ALKPHOS 31 (L) 06/29/2019 0636   BILITOT 0.8 06/29/2019 0636   GFRNONAA >60 07/09/2019 0552   GFRAA >60 07/09/2019 0552    Lipid Panel     Component Value Date/Time   CHOL 103 06/25/2019 0415   TRIG 136 06/25/2019 0415   HDL 28 (L) 06/25/2019 0415   CHOLHDL 3.7 06/25/2019 0415   VLDL 27 06/25/2019 0415   LDLCALC 48 06/25/2019 0415     Imaging I have reviewed the images obtained:  CT-scan of the brain-pending  MRI examination of the brain-pending  Etta Quill PA-C [facility] [phone]  M-F  (9:00 am- 5:00 PM)  07/16/2019, 2:25 PM     Assessment:  This 79 year old male presented to the hospital with new onset of right arm and leg weakness.  He has recently been to the hospital for multiple falls in which time he was found to  have 2 small right semiovale and MCA/ACA border zone small infarcts.  Patient has recently had a full stroke work-up in the beginning of May.  He currently is on dual antiplatelets.  Symptoms have improved significantly since he has been in the hospital.  Patient will need physical therapy and Occupational Therapy prior to leaving.  I do not feel that he needs to have a full stroke work-up as he is recently just had a full work-up on May 2.  I do agree with CT of head and MRI of brain to evaluate for possible new strokes.   Impression: -Possible stroke however given he has right-sided symptoms most likely on the left hemisphere  Recommend #MRI BRIAN  # Continue Dual antiplatelet therapy #Start or continue Atorvastatin 80 mg/other high intensity statin # BP goal: permissive HTN upto 220/120 mmHg  # HBAIC and Lipid profile # Telemetry monitoring # Frequent neuro checks # Stroke swallow screen # PT/OT  Please page stroke NP  Or  PA  Or MD from 8am -4 pm  as this patient from this time will be  followed by the stroke.   You can look them up on [email or web address]  Password [email or web address]  NEUROHOSPITALIST ADDENDUM Performed a face to face diagnostic evaluation.   I have reviewed the contents of history and physical exam as documented by PA/ARNP/Resident and agree with above documentation.  I have discussed and formulated the above plan as documented. Edits to the note have been made as needed.  Patient with history of multiple infarcts-as well as intracranial atherosclerotic disease presents to the emergency department after feeling weak on the right side most notably his right leg.  Was brought to the emergency department.  On examination he has mild right-sided weakness as well as reduced sensation over his right arm and leg.  MRI brain obtained: Shows new infarct in the left corona radiata. Recommend admission for stroke work-up and PT OT evaluation     Damari Suastegui MD [facility] [id number]   If 7pm to 7am, please call on call as listed on AMION.

## 2019-07-16 NOTE — H&P (Signed)
Date: 07/16/2019               Patient Name:  Justin [name] MRN: [id number]  DOB: 04/20/1940 Age / Sex: 79 y.o., male   PCP: Chesley Noon, MD         Medical Service: Internal Medicine Teaching Service         Attending Physician: Dr. Lucious Groves, DO    First Contact: Dr. Darrick Meigs Pager: [phone]  Second Contact: Dr. Willette Pa Pager: [phone]       After Hours (After 5p/  First Contact Pager: [phone]  weekends / holidays): Second Contact Pager: [phone]   Chief Complaint: RLE weakness  History of Present Illness: Mr. Dickow is a 79 year old M with significant PMH of strokes (left basal ganglia in 08/2017, left corona radiata in 09/2018, and right MCA 06/2019) with residual deficits of cognitive impairment, hypertension, type II diabetes, hyperlipidemia, OSA on CPAP, and prostate cancer s/p prostatectomy in 2012, who presented to the ED with RLE weakness. History was obtained via the patient, the patient's wife, and chart review.  Patient presents with 1 day history of right lower extremity weakness.  His wife notes that his symptoms began around dinnertime last evening at which time he required significantly more help with transferring than he had the prior days.  She notes that they went to bed and when he woke up this morning his symptoms had not improved and therefore prompted him to go to the ER.   Meds:  Amlodipine 2.5 mg daily Aspirin 81 mg daily Atorvastatin 80 mg daily Vitamin D 1000 units daily Plavix 75 mg daily Vitamin B12 1000 mg daily Fenofibrate 160 mg daily Lasix 20 mg daily Glipizide 2.5 mg daily Hydrochlorothiazide 25 mg daily 700 mg daily Metformin 500 mg twice daily Ozempic 0.25 mg weekly Zoloft 25 mg daily   Allergies: Allergies as of 07/16/2019  . (No Known Allergies)   Past Medical History:  Diagnosis Date  . Brain bleed ([place])   . Cancer [facility])    prostate  . 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 Depression   . Diabetes  mellitus without complication ([place])   . High cholesterol   . Hypertension   . Stroke [facility])     Family History:  Family History  Problem Relation Age of Onset  . CAD Mother   . Diabetes Mother   . Emphysema Father   . Diabetes Sister   . Diabetes Brother   . Diabetes Sister   . Diabetes Brother   . Diabetes Mellitus II Neg Hx      Social History: Resides in [place] at home with his wife.  No tobacco, alcohol or other substance use history.  Review of Systems: A complete ROS was negative except as per HPI.   Physical Exam: Blood pressure 140/74, pulse 79, temperature 98.6 F (37 C), temperature source Oral, resp. rate 15, height 6' (1.829 m), weight 99.8 kg, SpO2 97 %. General: No acute distress HEENT: Neck supple Cardiac: Regular rate and rhythm. Pulmonary: Breathing comfortably on room air.  Lungs clear. Abdomen: Mildly distended.  Hypoactive bowel sounds.  Nontender to palpation. Neuro: Cranial nerves II through XII intact.  5/5 strength in bilateral upper and lower extremities.  Right side slightly weaker than the left.  Sensation intact throughout. Skin: No rash Psych: Normal affect  EKG: Sinus rhythm.  Right bundle branch block.  MRI brain: Acute subcentimeter infarct of the left  corona radiata/centrum semiovale  Assessment & Plan by Problem: Active Problems:   CVA (cerebral vascular accident) ([place])  Acute infarct of the left corona radiata Neurology on board.  Not pursuing stroke work-up due to it being recently done at his prior admission.  He is already on dual antiplatelet therapy so we will continue that.  Will consult PT and OT.  Continuous telemetry monitoring.  May benefit from outpatient Holter monitor.  Hypertension.  Home medications: Amlodipine 2.5 mg daily, hydrochlorothiazide 25 mg daily, losartan 100 mg daily Hyperlipidemia.  Home medications: Atorvastatin 80 mg daily, fenofibrate 160 mg daily Plan: Home medications held on admission. Allow for  24 to 48 hours permissive hypertension after which we can slowly progress towards the normotensive range.  Continue statin and fenofibrate.  Type 2 diabetes.  Home medications: Glipizide 2.5 mg daily, Metformin 500 mg twice daily, Ozempic 0.25 mg weekly. Plan: We will hold home medications for now.  We will start with sliding scale insulin for tonight and may resume some of his home meds tomorrow.  Dispo: Admit patient to Inpatient with expected length of stay greater than 2 midnights.  Signed: [name], MD 07/16/2019, 6:07 PM  Pager: [phone]

## 2019-07-16 NOTE — ED Provider Notes (Signed)
[facility] EMERGENCY DEPARTMENT Provider Note   CSN: [id number] Arrival date & time: 07/16/19  1242     History Chief Complaint  Patient presents with  . Stroke Symptoms    Justin [name] is a 79 y.o. male.  He was admitted earlier in the month for an acute MCA stroke.  He went to rehab and return home less than a week ago with no deficits.  He said he had trouble with his right leg starting last night.  This morning could not use his right leg and difficulty with his right arm.  No blurry vision double vision.  No headache.  No numbness.  Last known well was likely before 10 PM last night.  The history is provided by the patient.  Cerebrovascular Accident This is a recurrent problem. The current episode started 12 to 24 hours ago. The problem occurs constantly. The problem has not changed since onset.Pertinent negatives include no chest pain, no abdominal pain, no headaches and no shortness of breath. Nothing aggravates the symptoms. Nothing relieves the symptoms. He has tried nothing for the symptoms. The treatment provided no relief.       Past Medical History:  Diagnosis Date  . Brain bleed ([place])   . Cancer [facility])    prostate  . 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 Depression   . Diabetes mellitus without complication ([place])   . High cholesterol   . Hypertension   . Stroke [facility])     Patient Active Problem List   Diagnosis Date Noted  . Dyslipidemia   . Benign essential HTN   . Diabetes mellitus type 2 in obese ([place])   . Major depressive disorder, recurrent episode, moderate ([place])   . Gait disorder   . Acute right MCA stroke ([place]) 06/28/2019  . Ambulatory dysfunction   . Stroke ([place]) 06/24/2019  . Type 2 diabetes mellitus ([place]) 10/04/2018  . Depression 10/04/2018  . Acute CVA (cerebrovascular accident) ([place]) 09/18/2017  . Stroke (cerebrum) ([place]) 09/17/2017  . Diabetes mellitus without complication ([place]) [id number]  . Closed head injury  04/22/2013  . HLD (hyperlipidemia) 07/29/2009  . Essential hypertension 07/29/2009  . EFFUSION, PLEURAL 07/29/2009  . DIVERTICULITIS, COLON 07/29/2009  . 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 COLONIC POLYPS, HYPERPLASTIC, HX OF 07/29/2009    Past Surgical History:  Procedure Laterality Date  . arm & hand surgery Left 1972   skill saw accident  . COLON SURGERY     partial  . EYE SURGERY     lens implant and cateract- Left  . PROSTATECTOMY         Family History  Problem Relation Age of Onset  . CAD Mother   . Diabetes Mother   . Emphysema Father   . Diabetes Sister   . Diabetes Brother   . Diabetes Sister   . Diabetes Brother   . Diabetes Mellitus II Neg Hx     Social History   Tobacco Use  . Smoking status: Never Smoker  . Smokeless tobacco: Never Used  Substance Use Topics  . Alcohol use: Never  . Drug use: Never    Home Medications Prior to Admission medications   Medication Sig Start Date End Date Taking? Authorizing Provider  amLODipine (NORVASC) 5 MG tablet Take 0.5 tablets (2.5 mg total) by mouth daily. 07/13/19   Love, Ivan Anchors, PA-C  aspirin EC 81 MG EC tablet Take 1 tablet (81 mg total) by mouth daily. 06/29/19  [name], MD  atorvastatin (LIPITOR) 80 MG tablet TAKE 1 TABLET EVERY DAY Patient taking differently: Take 80 mg by mouth daily.  12/11/18   Frann Rider, NP  Cholecalciferol (VITAMIN D-1000 MAX ST) 1000 units tablet Take 1,000 Units by mouth daily.    [provider]  clopidogrel (PLAVIX) 75 MG tablet Take 1 tablet (75 mg total) by mouth daily. 10/20/17   Frann Rider, NP  cyanocobalamin ([employer] VITAMIN B12) 1000 MCG tablet Take 1,000 mcg by mouth daily.     [provider]  docusate sodium (COLACE) 100 MG capsule Take 1 capsule (100 mg total) by mouth 3 (three) times daily. 07/13/19   Love, Ivan Anchors, PA-C  fenofibrate 160 MG tablet Take 160 mg by mouth daily. 10/08/16 06/24/19  [provider]  furosemide (LASIX) 20 MG  tablet Take 20 mg by mouth daily.  10/04/16 06/24/19  [provider]  glipiZIDE (GLUCOTROL) 5 MG tablet Take 0.5 tablets (2.5 mg total) by mouth daily before breakfast. 07/14/19   Love, Ivan Anchors, PA-C  hydrocerin (EUCERIN) CREA Apply 1 application topically 2 (two) times daily. 07/13/19   Love, Ivan Anchors, PA-C  hydrochlorothiazide (HYDRODIURIL) 25 MG tablet Take 1 tablet (25 mg total) by mouth daily. 06/29/19   [name], MD  losartan (COZAAR) 100 MG tablet Take 100 mg by mouth daily.    [provider]  metFORMIN (GLUCOPHAGE) 500 MG tablet Take 1 tablet (500 mg total) by mouth 2 (two) times daily with a meal. 07/13/19   Love, Ivan Anchors, PA-C  Omega-3 Fatty Acids (FISH OIL) 1000 MG CAPS Take 1,000 mg by mouth daily with breakfast.    [provider]  polyvinyl alcohol (LIQUIFILM TEARS) 1.4 % ophthalmic solution Place 1 drop into both eyes 4 (four) times daily as needed for dry eyes.    [provider]  Semaglutide (OZEMPIC) 0.25 or 0.5 MG/DOSE SOPN Inject 0.25 mg into the skin once a week.    [provider]  senna-docusate (SENOKOT-S) 8.6-50 MG tablet Take 2 tablets by mouth at bedtime. 07/11/19   Love, Ivan Anchors, PA-C  sertraline (ZOLOFT) 25 MG tablet Take 1 tablet (25 mg total) by mouth daily. 04/10/19   Frann Rider, NP    Allergies    Patient has no known allergies.  Review of Systems   Review of Systems  Constitutional: Negative for fever.  HENT: Negative for sore throat.   Eyes: Negative for visual disturbance.  Respiratory: Negative for shortness of breath.   Cardiovascular: Negative for chest pain.  Gastrointestinal: Negative for abdominal pain.  Genitourinary: Negative for dysuria.  Musculoskeletal: Negative for neck pain.  Skin: Negative for rash.  Neurological: Positive for weakness. Negative for facial asymmetry, speech difficulty and headaches.    Physical Exam Updated Vital Signs BP 130/65   Pulse 70   Temp 98.6 F (37 C)  (Oral)   Resp 15   Ht 6' (1.829 m)   Wt 99.8 kg   SpO2 98%   BMI 29.84 kg/m   Physical Exam Vitals and nursing note reviewed.  Constitutional:      Appearance: He is well-developed.  HENT:     Head: Normocephalic and atraumatic.  Eyes:     Conjunctiva/sclera: Conjunctivae normal.  Cardiovascular:     Rate and Rhythm: Normal rate and regular rhythm.     Heart sounds: No murmur.  Pulmonary:     Effort: Pulmonary effort is normal. No respiratory distress.     Breath sounds: Normal  breath sounds.  Abdominal:     Palpations: Abdomen is soft.     Tenderness: There is no abdominal tenderness.  Musculoskeletal:        General: No deformity or signs of injury. Normal range of motion.     Cervical back: Neck supple.  Skin:    General: Skin is warm and dry.     Capillary Refill: Capillary refill takes less than 2 seconds.  Neurological:     Mental Status: He is alert and oriented to person, place, and time.     Cranial Nerves: No cranial nerve deficit.     Sensory: No sensory deficit.     Motor: Weakness present.     Comments: Right upper extremity is 4 out of 5 with some pronator drift.  Left upper is 5 out of 5 normal finger-to-nose.  Right lower extremity 4 out of 5.  Left lower extremity 5 out of 5.  Cranial nerves with normal sensation no facial droop tongue midline.  Visual fields intact to confrontation.     ED Results / Procedures / Treatments   Labs (all labs ordered are listed, but only abnormal results are displayed) Labs Reviewed  CBC WITH DIFFERENTIAL/PLATELET - Abnormal; Notable for the following components:      Result Value   WBC 11.0 (*)    Neutro Abs 7.8 (*)    All other components within normal limits  COMPREHENSIVE METABOLIC PANEL - Abnormal; Notable for the following components:   Total Protein 6.3 (*)    Alkaline Phosphatase 36 (*)    All other components within normal limits  APTT - Abnormal; Notable for the following components:   aPTT 21 (*)    All  other components within normal limits  I-STAT CHEM 8, ED - Abnormal; Notable for the following components:   BUN 26 (*)    Calcium, Ion 1.12 (*)    All other components within normal limits  CBG MONITORING, ED - Abnormal; Notable for the following components:   Glucose-Capillary 118 (*)    All other components within normal limits  CBG MONITORING, ED - Abnormal; Notable for the following components:   Glucose-Capillary 118 (*)    All other components within normal limits  SARS CORONAVIRUS 2 BY RT PCR (HOSPITAL ORDER, [facility])  ETHANOL  PROTIME-INR  RAPID URINE DRUG SCREEN, HOSP PERFORMED  URINALYSIS, ROUTINE W REFLEX MICROSCOPIC  CBC  BASIC METABOLIC PANEL    EKG EKG Interpretation  Date/Time:  Monday Jul 16 2019 12:48:51 EDT Ventricular Rate:  80 PR Interval:    QRS Duration: 144 QT Interval:  378 QTC Calculation: 436 R Axis:   -70 Text Interpretation: Sinus rhythm Right bundle branch block Inferior infarct, old Lateral leads are also involved Baseline wander in lead(s) V1 No significant change since prior 5/21 Confirmed by Aletta Edouard [phone]) on 07/16/2019 12:56:46 PM   Radiology CT HEAD WO CONTRAST  Result Date: 07/16/2019 CLINICAL DATA:  New right-sided deficit, recent stroke, last known well 2:30 p.m. yesterday, right leg weakness since last evening with new inability to ambulate today EXAM: CT HEAD WITHOUT CONTRAST TECHNIQUE: Contiguous axial images were obtained from the base of the skull through the vertex without intravenous contrast. COMPARISON:  06/24/2019 FINDINGS: Brain: Diffuse hypodensities are seen throughout the periventricular white matter, consistent with chronic small vessel ischemic change. Chronic ischemic changes bilateral basal ganglia, left greater than right. No new infarct or hemorrhage. Lateral ventricles and midline structures are stable. No acute  extra-axial fluid collections. No mass effect. Vascular: No hyperdense  vessel or unexpected calcification. Skull: Normal. Negative for fracture or focal lesion. Sinuses/Orbits: No acute finding. Other: None. IMPRESSION: 1. Stable chronic small-vessel ischemic changes throughout the periventricular white matter and bilateral basal ganglia. 2. No evidence of acute infarct or hemorrhage. Electronically Signed   By: Randa Ngo M.D.   On: 07/16/2019 16:17   MR BRAIN WO CONTRAST  Result Date: 07/16/2019 CLINICAL DATA:  Decreased sensation and use of right arm EXAM: MRI HEAD WITHOUT CONTRAST TECHNIQUE: Multiplanar, multiecho pulse sequences of the brain and surrounding structures were obtained without intravenous contrast. COMPARISON:  06/24/2019 FINDINGS: Brain: There is a new subcentimeter focus of restricted diffusion in the left corona radiata/centrum semiovale. Expected evolution of right corona radiata/centrum semiovale infarcts. Confluent areas of T2 hyperintensity in the supratentorial white matter are nonspecific but probably reflect advanced chronic microvascular ischemic changes. There are chronic bilateral infarcts of the corona radiata. Prominence of ventricles and sulci reflects stable generalized parenchymal volume loss superimposed on ex vacuo dilatation related to above. Stable foci of susceptibility reflecting chronic blood products. There is no intracranial mass or mass effect. Vascular: Major vessel flow voids at the skull base are preserved. Skull and upper cervical spine: Normal marrow signal is preserved. Sinuses/Orbits: Minor mucosal thickening.  Left lens replacement. Other: Sella is unremarkable.  Mastoid air cells are clear. IMPRESSION: Acute subcentimeter infarct of the left corona radiata/centrum semiovale. Expected evolution of recent infarcts seen on the prior study. Stable additional chronic findings detailed above. Electronically Signed   By: Macy Mis M.D.   On: 07/16/2019 16:43    Procedures Procedures (including critical care  time)  Medications Ordered in ED Medications  aspirin EC tablet 81 mg (has no administration in time range)  atorvastatin (LIPITOR) tablet 80 mg (has no administration in time range)  fenofibrate tablet 160 mg (has no administration in time range)  sertraline (ZOLOFT) tablet 25 mg (has no administration in time range)  clopidogrel (PLAVIX) tablet 75 mg (has no administration in time range)  enoxaparin (LOVENOX) injection 40 mg (has no administration in time range)  acetaminophen (TYLENOL) tablet 650 mg (has no administration in time range)    Or  acetaminophen (TYLENOL) suppository 650 mg (has no administration in time range)  senna (SENOKOT) tablet 8.6 mg (has no administration in time range)  insulin aspart (novoLOG) injection 0-15 Units (has no administration in time range)    ED Course  I have reviewed the triage vital signs and the nursing notes.  Pertinent labs & imaging results that were available during my care of the patient were reviewed by me and considered in my medical decision making (see chart for details).  Clinical Course as of Jul 16 2002  Mon Jul 16, 2019  1312 Discussed with Dr. Lorraine Lax neurology who will evaluate and consult.  He feels is likely related to his significant intracranial atherosclerosis.  Will likely need admission and MRI.   [MB]  1552 Discussed with Dr. Darrick Meigs from the IM teaching service who will evaluate the patient for admission.   [MB]    Clinical Course User Index [MB] Hayden Rasmussen, MD   MDM Rules/Calculators/A&P                     This patient complains of new right arm and leg weakness; this involves an extensive number of treatment Options and is a complaint that carries with it a high risk of complications and  Morbidity. The differential includes stroke, bleed, anabolic derangement, vascular, arrhythmia  I ordered, reviewed and interpreted labs, which included CBC with elevated white count unclear significance, normal normal  hemoglobin, normal chemistry, normal INR,  I ordered imaging studies which included CT head and MRI brain and I independently    visualized and interpreted imaging which showed no acute infarct Previous records obtained and reviewed in epic including last neurology and rehab notes I consulted Dr. Lorraine Lax neurology and discussed lab and imaging findings  Critical Interventions: None  After the interventions stated above, I reevaluated the patient and found patient to be hemodynamically stable.  Continues to have neuro deficits.  Discussed with neurology who recommended admission for MRI and further work-up.  Discussed with internal medicine team who admitted him few weeks ago.  They will see for admission.  Reviewed with patient who is in agreement with plan.   Final Clinical Impression(s) / ED Diagnoses Final diagnoses:  Acute CVA (cerebrovascular accident) [facility])    Rx / [place] Orders ED Discharge Orders    None       Hayden Rasmussen, MD 07/16/19 2017

## 2019-07-16 NOTE — ED Triage Notes (Signed)
Pt arrives with c/o new right side deficits following 19 day hospital stay for stroke. Deficits had completely resolved and he was ambulatory, last felt completely normal last 1430 yesterday, noticed R leg weakness 2230 last night, woke up this morning with R sided weakness and inability to ambulate. LVO negative, no code stroke called per Dr Melina Copa

## 2019-07-16 NOTE — ED Notes (Signed)
CBG Results of 118 reported to [place], Therapist, sports.

## 2019-07-17 DIAGNOSIS — I63412 Cerebral infarction due to embolism of left middle cerebral artery: Secondary | ICD-10-CM

## 2019-07-17 LAB — GLUCOSE, CAPILLARY
Glucose-Capillary: 93 mg/dL (ref 70–99)
Glucose-Capillary: 94 mg/dL (ref 70–99)
Glucose-Capillary: 179 mg/dL — ABNORMAL HIGH (ref 70–99)
Glucose-Capillary: 153 mg/dL — ABNORMAL HIGH (ref 70–99)
Glucose-Capillary: 146 mg/dL — ABNORMAL HIGH (ref 70–99)
Glucose-Capillary: 126 mg/dL — ABNORMAL HIGH (ref 70–99)

## 2019-07-17 LAB — URINALYSIS, ROUTINE W REFLEX MICROSCOPIC
Bilirubin Urine: NEGATIVE
Glucose, UA: 50 mg/dL — AB
Hgb urine dipstick: NEGATIVE
Ketones, ur: NEGATIVE mg/dL
Leukocytes,Ua: NEGATIVE
Nitrite: NEGATIVE
Protein, ur: NEGATIVE mg/dL
Specific Gravity, Urine: 1.003 — ABNORMAL LOW (ref 1.005–1.030)
pH: 7 (ref 5.0–8.0)

## 2019-07-17 LAB — BASIC METABOLIC PANEL
Anion gap: 8 (ref 5–15)
BUN: 14 mg/dL (ref 8–23)
CO2: 27 mmol/L (ref 22–32)
Calcium: 8.6 mg/dL — ABNORMAL LOW (ref 8.9–10.3)
Chloride: 104 mmol/L (ref 98–111)
Creatinine, Ser: 0.72 mg/dL (ref 0.61–1.24)
GFR calc Af Amer: >60 mL/min (ref >60)
GFR calc non Af Amer: >60 mL/min (ref >60)
Glucose, Bld: 98 mg/dL (ref 70–99)
Potassium: 3.5 mmol/L (ref 3.5–5.1)
Sodium: 139 mmol/L (ref 135–145)

## 2019-07-17 LAB — CBC
HCT: 41.9 % (ref 39.0–52.0)
Hemoglobin: 13.8 g/dL (ref 13.0–17.0)
MCH: 27.5 pg (ref 26.0–34.0)
MCHC: 32.9 g/dL (ref 30.0–36.0)
MCV: 83.5 fL (ref 80.0–100.0)
Platelets: 241 10*3/uL (ref 150–400)
RBC: 5.02 MIL/uL (ref 4.22–5.81)
RDW: 12.6 % (ref 11.5–15.5)
WBC: 7.5 10*3/uL (ref 4.0–10.5)
nRBC: 0 % (ref 0.0–0.2)

## 2019-07-17 LAB — RAPID URINE DRUG SCREEN, HOSP PERFORMED
Amphetamines: NOT DETECTED
Barbiturates: NOT DETECTED
Benzodiazepines: NOT DETECTED
Cocaine: NOT DETECTED
Opiates: NOT DETECTED
Tetrahydrocannabinol: NOT DETECTED

## 2019-07-17 NOTE — Care Management (Signed)
Per Desiree R. W/[employer] [phone]  Co-pay amount for Brilinta 90 mg bid for 30 day supply $45.00.  [employer] mail order: for Brilinta 90 mg. Bid for a 90 day supply,$125.00.   No PA required No Deductible met Tier 3 Retail pharmacy: [place].  * Also patient has [place] ([facility]

## 2019-07-17 NOTE — Evaluation (Signed)
Clinical/Bedside Swallow Evaluation Patient Details  Name: Justin [name] MRN: [id number] Date of Birth: Jun 26, 1940  Today's Date: 07/17/2019 Time: SLP Start Time (ACUTE ONLY): 0850 SLP Stop Time (ACUTE ONLY): 0907 SLP Time Calculation (min) (ACUTE ONLY): 17 min  Past Medical History:  Past Medical History:  Diagnosis Date  . Brain bleed ([place])   . Cancer [facility])    prostate  . 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 Depression   . Diabetes mellitus without complication ([place])   . High cholesterol   . Hypertension   . Stroke [facility])    Past Surgical History:  Past Surgical History:  Procedure Laterality Date  . arm & hand surgery Left 1972   skill saw accident  . COLON SURGERY     partial  . EYE SURGERY     lens implant and cateract- Left  . PROSTATECTOMY     HPI:  Justin [name] is a 79 year old M with significant PMH of strokes (left basal ganglia in 08/2017, left corona radiata in 09/2018, and right MCA 06/2019) with residual deficits of cognitive impairment, who presented to the ED with increased RLE weakness of one day. Left CIR one week ago, but increased weakness noted since discharge. MRI Head reveals Acute subcentimeter infarct of the left corona radiata/centrumsemiovale.   Assessment / Plan / Recommendation Clinical Impression  Justin [name] demonstrates no s/s of oropharyngeal dysphagia. He passed the [employer] Protocol 3 oz challenge by consuming 3 oz consecutively without difficulty. He was seen with graham cracker without difficulty and had no need for liquid wash (adequate oral clearance). Pt and son who are present deny any swallowing difficulty. Son also denies any changes in patient's cognition (pt with cognitive impairment baseline). Oral mech exam was unremarkable. Continue regular solids and thin liquids; meds as tolerated. No further ST indicated.    SLP Visit Diagnosis: Dysphagia, unspecified (R13.10)    Aspiration Risk  Mild aspiration risk    Diet  Recommendation Regular;Thin liquid   Liquid Administration via: Straw;Cup Medication Administration: Whole meds with liquid Supervision: Patient able to self feed    Other  Recommendations Oral Care Recommendations: Oral care BID   Follow up Recommendations None        Swallow Study   General Date of Onset: 07/16/19 HPI: [name] is a 79 year old M with significant PMH of strokes (left basal ganglia in 08/2017, left corona radiata in 09/2018, and right MCA 06/2019) with residual deficits of cognitive impairment, who presented to the ED with increased RLE weakness of one day. Left CIR one week ago, but increased weakness noted since discharge. MRI Head reveals Acute subcentimeter infarct of the left corona radiata/centrumsemiovale. Type of Study: Bedside Swallow Evaluation Previous Swallow Assessment: Never formally assessed in prior hospitalizations (always passed screen) Diet Prior to this Study: Regular;Thin liquids Temperature Spikes Noted: No Respiratory Status: Room air History of Recent Intubation: No Behavior/Cognition: Alert;Cooperative;Pleasant mood Oral Cavity Assessment: Within Functional Limits Oral Care Completed by SLP: Recent completion by staff Oral Cavity - Dentition: Adequate natural dentition Vision: Functional for self-feeding Self-Feeding Abilities: Able to feed self Patient Positioning: Upright in bed Baseline Vocal Quality: Normal Volitional Cough: Strong Volitional Swallow: Able to elicit    Oral/Motor/Sensory Function Overall Oral Motor/Sensory Function: Within functional limits   Ice Chips     Thin Liquid Thin Liquid: Within functional limits Presentation: Straw;Cup    Solid    Justin [name], M.S., CCC-SLP Speech-Language Pathologist Acute  Rehabilitation Services Pager: [phone]  Solid: Within functional limits Presentation: [place] 07/17/2019,9:10 AM

## 2019-07-17 NOTE — Discharge Summary (Signed)
Name: Justin [name] MRN: [id number] DOB: 05-May-1940 79 y.o. PCP: Chesley Noon, MD  Date of Admission: 07/16/2019 12:42 PM Date of Discharge: 07/18/19 Attending Physician: Lucious Groves, DO  Discharge Diagnosis: 1. 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 What changed: how much to take   aspirin 81 MG EC tablet Take 1 tablet (81 mg total) by mouth daily.   atorvastatin 80 MG tablet Commonly known as: LIPITOR TAKE 1 TABLET EVERY DAY   clopidogrel 75 MG tablet Commonly known as: PLAVIX Take 1 tablet (75 mg total) by mouth daily.   [employer] VITAMIN B12 1000 MCG tablet Generic drug: cyanocobalamin Take 1,000 mcg by mouth daily.   docusate sodium 100 MG capsule Commonly known as: COLACE Take 1 capsule (100 mg total) by mouth 3 (three) times daily.   fenofibrate 160 MG tablet Take 160 mg by mouth daily.   Fish Oil 1000 MG Caps Take 1,000 mg by mouth daily with breakfast.   furosemide 20 MG tablet Commonly known as: LASIX Take 20 mg by mouth daily.   glipiZIDE 5 MG tablet Commonly known as: GLUCOTROL Take 0.5 tablets (2.5 mg total) by mouth daily before breakfast. What changed:   how much to take  when to take this   hydrocerin Crea Apply 1 application topically 2 (two) times daily.   hydrochlorothiazide 25 MG tablet Commonly known as: HYDRODIURIL Take 1 tablet (25 mg total) by mouth daily.   losartan 100 MG tablet Commonly known as: COZAAR Take 100 mg by mouth daily.   metFORMIN 500 MG tablet Commonly known as: GLUCOPHAGE Take 1 tablet (500 mg total) by mouth 2 (two) times daily with a meal.   Ozempic (0.25 or 0.5 MG/DOSE) 2 MG/1.5ML Sopn Generic drug: Semaglutide(0.25 or 0.5MG /DOS) Inject 0.25 mg into the  skin once a week.   polyvinyl alcohol 1.4 % ophthalmic solution Commonly known as: LIQUIFILM TEARS Place 1 drop into both eyes 4 (four) times daily as needed for dry eyes.   senna-docusate 8.6-50 MG tablet Commonly known as: Senokot-S Take 2 tablets by mouth at bedtime.   sertraline 25 MG tablet Commonly known as: Zoloft Take 1 tablet (25 mg total) by mouth daily.   Vitamin D-1000 Max St 25 MCG (1000 UT) tablet Generic drug: Cholecalciferol Take 1,000 Units by mouth daily.       Disposition and follow-up:   Mr.Justin [name] was discharged from [facility] in Stable condition.  At the hospital follow up visit please address:  1.  Acute infarct of left corona radiata. Deficits are primarily RLE weakness. Evaluated by PT/OT who are recommending CIR for further rehabilitation.  -continue plavix and aspirin for 41mo (end date 10/17/19), then plavix alone -neurology follow up -consider inquiring into insurance coverage for brillinta as he developed the stroke this admission while on plavix and aspirin for his prior stroke that occurred the beginning of May of this year  2.  Labs / imaging needed at time of follow-up: none  3.  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 He had recovered well and had returned home with the assistance of his wife.  On the evening of 5/23, he and his wife noted difficulties with ambulation due to RLE weakness. They were unsure if this was just due to fatigue and subsequently went to bed. Upon awakening, the weakness had not improved which prompted them to seek further evaluation in the ED.  Brain MRI was obtained in the ED and revealed a subcentimeter infarct involving his left corona radiata.   Patient and his wife endorse compliance with his discharge medication  regimen including aspirin and plavix. He had not missed any doses.   Due to the recent stroke workup that was preformed during his prior admission earlier this month, repeat workup was deferred.   He was evaluated by PT and OT who recommended CIR for continued recovery of his RLE weakness. PMR was consulted for CIR admission.  Discharge Vitals:   BP (!) 136/55 (BP Location: Right Arm)   Pulse 61   Temp 98.2 F (36.8 C) (Axillary)   Resp 16   Ht 6' (1.829 m)   Wt 99.6 kg   SpO2 100%   BMI 29.78 kg/m   Pertinent Labs, Studies, and Procedures:  5/24 MRI brain: new subcentimeter focus of restricted diffusion in the left corona radiata/centrum semiovale. Expect evolution of right corona radiata/centrum semiovale infarcts. Findings consistent with advanced chronic microvascular ischemic changes noted. Chronic bilateal infarcts of the corona radiata.   CBC Latest Ref Rng & Units 07/17/2019 07/16/2019 07/16/2019  WBC 4.0 - 10.5 K/uL 7.5 - 11.0(H)  Hemoglobin 13.0 - 17.0 g/dL 13.8 14.6 15.0  Hematocrit 39.0 - 52.0 % 41.9 43.0 45.6  Platelets 150 - 400 K/uL 241 - 267   BMP Latest Ref Rng & Units 07/17/2019 07/16/2019 07/16/2019  Glucose 70 - 99 mg/dL 98 92 95  BUN 8 - 23 mg/dL 14 26(H) 20  Creatinine 0.61 - 1.24 mg/dL 0.72 0.90 0.86  Sodium 135 - 145 mmol/L 139 138 140  Potassium 3.5 - 5.1 mmol/L 3.5 4.1 4.1  Chloride 98 - 111 mmol/L 104 101 103  CO2 22 - 32 mmol/L 27 - 26  Calcium 8.9 - 10.3 mg/dL 8.6(L) - 9.3      Signed: [name], MD 07/18/2019, 11:29 AM   Pager: [phone]

## 2019-07-17 NOTE — Plan of Care (Signed)

## 2019-07-17 NOTE — Evaluation (Signed)
Occupational Therapy Evaluation Patient Details Name: Justin [name] MRN: [id number] DOB: [birth year] Today's Date: 07/17/2019    History of Present Illness This 79 y.o. malewith history of CVA, HTN, hypercholesterolemia, diabetes, depression, bursitis in R hip, and intracranial hemorrhage.  The patient was admitted on 10/03/2018 for a acute MCA stroke on the left.  Patient also had MRI on 06/24/2019 which did show there were 2 small acute infarcts within the right MCA territory at the centrum semiovale and posterior corona radiata.  He did go to rehab and when he returned home less than a week ago he had no deficits. Returned to ED with decreased sensitivity and incoordination of R UE & LE. MRI reveals a Acute subcentimeter infarct of the left corona radiata/centrum semiovale.   Clinical Impression   Patient recently discharged from CIR, readmitted for above and limited by problem list below, including impaired balance, dysmetric R UE, weakness in R LE and impaired balance, as well as impaired cognition.  Patient currently requires min A to total assist +2 for ADLs, min assist for bed mobility and min to mod assist +2 for transfers.  He follows commands with increased time, requires multimodal cueing for sequencing, problem solving and safety.  He reports after dc home he was completing ADLs and mobility without assist. Believe he will best benefit from continued OT services at CIR level in order to optimize return to PLOF with ADLs, mobility.  Will follow acutely.     Follow Up Recommendations  CIR;Supervision/Assistance - 24 hour    Equipment Recommendations  None recommended by OT    Recommendations for Other Services Rehab consult     Precautions / Restrictions Precautions Precautions: Fall Precaution Comments: R lateral lean and h/o falls Restrictions Weight Bearing Restrictions: No      Mobility Bed Mobility Overal bed mobility: Needs Assistance Bed Mobility: Sidelying to  Sit;Rolling Rolling: Min guard Sidelying to sit: Mod assist;+2 for physical assistance;+2 for safety/equipment;HOB elevated       General bed mobility comments: assist for trunk support to sitting EOB towards L side   Transfers Overall transfer level: Needs assistance Equipment used: Rolling walker (2 wheeled) Transfers: Sit to/from Stand Sit to Stand: Min assist;+2 physical assistance;+2 safety/equipment         General transfer comment: initally min assist +2 safety, fading with fatigue to min +2 physical assist     Balance Overall balance assessment: Needs assistance Sitting-balance support: No upper extremity supported;Single extremity supported;Feet supported Sitting balance-Leahy Scale: Fair Sitting balance - Comments: preference to L UE support on bed rail,  R lateral lean without UE support Postural control: Right lateral lean Standing balance support: Bilateral upper extremity supported;During functional activity Standing balance-Leahy Scale: Poor Standing balance comment: relies on UB and external support, R lateral lean                           ADL either performed or assessed with clinical judgement   ADL Overall ADL's : Needs assistance/impaired     Grooming: Minimal assistance;Sitting   Upper Body Bathing: Supervision/ safety;Set up;Sitting   Lower Body Bathing: Maximal assistance;Sit to/from stand;+2 for physical assistance;+2 for safety/equipment   Upper Body Dressing : Sitting;Minimal assistance   Lower Body Dressing: Total assistance;+2 for physical assistance;+2 for safety/equipment;Sitting/lateral leans;Sit to/from stand   Toilet Transfer: Moderate assistance;+2 for safety/equipment;RW;Stand-pivot;+2 for physical assistance Toilet Transfer Details (indicate cue type and reason): simulated to recliner  Toileting- Clothing Manipulation and Hygiene:  Total assistance;Bed level Toileting - Clothing Manipulation Details (indicate cue type and  reason): incontient of BM in bed     Functional mobility during ADLs: Minimal assistance;Moderate assistance;+2 for physical assistance;+2 for safety/equipment General ADL Comments: pt limited by R sided weakness and impaired coordination, decreased safety awareness, impaired balance      Vision Baseline Vision/History: No visual deficits Wears Glasses: Reading only Patient Visual Report: No change from baseline Additional Comments: further assessment if warrented      Perception     Praxis Praxis Praxis tested?: Within functional limits    Pertinent Vitals/Pain Pain Assessment: No/denies pain     Hand Dominance Right   Extremity/Trunk Assessment Upper Extremity Assessment Upper Extremity Assessment: RUE deficits/detail RUE Deficits / Details: MMT WFL, sensation WFL, dysmetric R UE FMC/GMC  RUE Sensation: WNL RUE Coordination: decreased fine motor;decreased gross motor   Lower Extremity Assessment Lower Extremity Assessment: Defer to PT evaluation   Cervical / Trunk Assessment Cervical / Trunk Assessment: Normal   Communication Communication Communication: Expressive difficulties   Cognition Arousal/Alertness: Awake/alert Behavior During Therapy: WFL for tasks assessed/performed Overall Cognitive Status: Impaired/Different from baseline Area of Impairment: Safety/judgement;Awareness;Following commands;Attention;Problem solving                   Current Attention Level: Sustained   Following Commands: Follows one step commands with increased time Safety/Judgement: Decreased awareness of safety;Decreased awareness of deficits Awareness: Emergent Problem Solving: Slow processing;Requires verbal cues;Difficulty sequencing General Comments: pt initally reports June, but to correct to May with cueing; requires min verbal cueing to attend to task and noted increased cueing for sequencing and slow processing    General Comments  Pt's son present during session     Exercises     Shoulder Instructions      Home Living Family/patient expects to be discharged to:: Private residence Living Arrangements: Spouse/significant other Available Help at Discharge: Family;Available 24 hours/day Type of Home: House Home Access: Stairs to enter [employer] of Steps: 3 Entrance Stairs-Rails: Right Home Layout: One level     Bathroom Shower/Tub: Occupational psychologist: Handicapped height Bathroom Accessibility: Yes How Accessible: Accessible via wheelchair Home Equipment: Hand held shower head;Shower seat;Cane - quad;Walker - 2 wheels   Additional Comments: wife can provide 24/7 supervision  Lives With: Spouse    Prior Functioning/Environment Level of Independence: Needs assistance  Gait / Transfers Assistance Needed: Does his own ADLs. Wife does IADLs. ADL's / Homemaking Assistance Needed: Pt reports he is able to perform ADLs mod I.  wife assists with IADLs and with medication management             OT Problem List: Decreased activity tolerance;Decreased coordination;Decreased safety awareness;Decreased knowledge of use of DME or AE;Impaired UE functional use;Impaired balance (sitting and/or standing);Decreased cognition;Obesity      OT Treatment/Interventions: Self-care/ADL training;Therapeutic exercise;Neuromuscular education;Energy conservation;DME and/or AE instruction;Therapeutic activities;Balance training;Patient/family education;Cognitive remediation/compensation    OT Goals(Current goals can be found in the care plan section) Acute Rehab OT Goals Patient Stated Goal: to go to rehab OT Goal Formulation: With patient Time For Goal Achievement: 07/31/19 Potential to Achieve Goals: Good  OT Frequency: Min 2X/week   Barriers to D/C:            Co-evaluation PT/OT/SLP Co-Evaluation/Treatment: Yes Reason for Co-Treatment: For patient/therapist safety;To address functional/ADL transfers   OT goals addressed  during session: ADL's and self-care;Proper use of Adaptive equipment and DME      AM-PAC OT "6 Clicks"  Daily Activity     Outcome Measure Help from another person eating meals?: A Little Help from another person taking care of personal grooming?: A Little Help from another person toileting, which includes using toliet, bedpan, or urinal?: Total Help from another person bathing (including washing, rinsing, drying)?: A Lot Help from another person to put on and taking off regular upper body clothing?: A Little Help from another person to put on and taking off regular lower body clothing?: Total 6 Click Score: 13   End of Session Equipment Utilized During Treatment: Gait belt;Rolling walker  Activity Tolerance: Patient tolerated treatment well Patient left: in chair;with call bell/phone within reach;with chair alarm set;with family/visitor present(son present)  OT Visit Diagnosis: Unsteadiness on feet (R26.81);Muscle weakness (generalized) (M62.81);History of falling (Z91.81);Other symptoms and signs involving the nervous system (R29.898) Symptoms and signs involving cognitive functions: Cerebral infarction                Time: [id number] OT Time Calculation (min): 19 min Charges:  OT General Charges $OT Visit: 1 Visit OT Evaluation $OT Eval Moderate Complexity: 1 Mod  Jolaine Artist, OT Acute Rehabilitation Services Pager [phone] Office [phone]   Delight Stare 07/17/2019, 10:18 AM

## 2019-07-17 NOTE — Progress Notes (Signed)
Inpatient Rehabilitation Admissions Coordinator  Inpatient rehab consult received. I met with patient and his wife at bedside to discuss goals and expectations of a possible CIR admit. They were just discharged from [place] [facility] on 5/21. They would like to be readmitted. I will begin insurance authorization with [employer]. I will follow up tomorrow pending insurance approval and bed available for CIR.  Danne Baxter, RN, MSN Rehab Admissions Coordinator [phone] 07/17/2019 3:26 PM

## 2019-07-17 NOTE — Evaluation (Signed)
Physical Therapy Evaluation Patient Details Name: Justin [name] MRN: [id number] DOB: 1940-08-08 Today's Date: 07/17/2019   History of Present Illness  Patient is a 79 y/o male who presents with right sided weakness/numbness. Brain MRI- acute infarct in left corona radiata/centrum semiovale. Recent admissions for CVA 10/03/18 (left MCA CVA) and 06/24/19 (right MCA CVA). PMH includes multiple CVAs, HTN, hypercholesterolemia, DM, depression, ICH.  Clinical Impression  Patient presents with right sided weakness, incoordination, impaired balance, decreased activity tolerance and impaired mobility s/p above. Pt recently d/ced from CIR last week and was mod I with ADLs and using RW for ambulation PTA. Today, pt requires Mod A for bed mobility, Min A of 2 for standing and Mod A of 2 for pivotal steps to chair due to right lateral lean and difficulty advancing RLE. Pt is a high fall risk at this time due to above deficits. Would benefit from CIR to maximize independence and mobility prior to return home. Will follow acutely.    Follow Up Recommendations CIR    Equipment Recommendations  Other (comment)(defer to post acute venue)    Recommendations for Other Services Rehab consult     Precautions / Restrictions Precautions Precautions: Fall Precaution Comments: Rt lateral lean and h/o falls Restrictions Weight Bearing Restrictions: No      Mobility  Bed Mobility Overal bed mobility: Needs Assistance Bed Mobility: Sidelying to Sit;Rolling Rolling: Min guard Sidelying to sit: Mod assist;+2 for physical assistance;+2 for safety/equipment;HOB elevated       General bed mobility comments: assist for trunk support to sitting EOB towards L side   Transfers Overall transfer level: Needs assistance Equipment used: Rolling walker (2 wheeled) Transfers: Sit to/from [facility] Sit to Stand: Min assist;+2 physical assistance;+2 safety/equipment Stand pivot transfers: Mod assist;+2  physical assistance       General transfer comment: initally min assist +2 safety, fading with fatigue to min +2 physical assist. Stood multiple times for peri-hygiene as pt found sitting in stool. Took a few pivotal steps to chair but difficulty advancing RLE despite assist with weight shifting and RW management; right lateral lean.  Ambulation/Gait                Stairs            Wheelchair Mobility    Modified Rankin (Stroke Patients Only)       Balance Overall balance assessment: Needs assistance Sitting-balance support: No upper extremity supported;Single extremity supported;Feet supported Sitting balance-Leahy Scale: Fair Sitting balance - Comments: preference to L UE support on bed rail,  R lateral lean without UE support Postural control: Right lateral lean Standing balance support: Bilateral upper extremity supported;During functional activity Standing balance-Leahy Scale: Poor Standing balance comment: relies on UB and external support, Rt lateral lean, MOd A at times but able to self correct                             Pertinent Vitals/Pain Pain Assessment: No/denies pain    Home Living Family/patient expects to be discharged to:: Private residence Living Arrangements: Spouse/significant other Available Help at Discharge: Family;Available 24 hours/day Type of Home: House Home Access: Stairs to enter Entrance Stairs-Rails: Right Entrance Stairs-Number of Steps: 3 Home Layout: One level Home Equipment: Hand held shower head;Shower seat;Cane - quad;Walker - 2 wheels Additional Comments: wife can provide 24/7 supervision    Prior Function Level of Independence: Needs assistance   Gait / Transfers Assistance Needed:  Does his own ADLs. Uses RW for ambulation; no falls since discharge from CIR last week.  ADL's / Homemaking Assistance Needed: Wife assists with IADLs and medication management.        Hand Dominance   Dominant Hand:  Right    Extremity/Trunk Assessment   Upper Extremity Assessment Upper Extremity Assessment: Defer to OT evaluation RUE Deficits / Details: MMT WFL, sensation WFL, dysmetric R UE FMC/GMC  RUE Sensation: WNL RUE Coordination: decreased fine motor;decreased gross motor    Lower Extremity Assessment Lower Extremity Assessment: RLE deficits/detail RLE Deficits / Details: Grossly ~4/5 throughout RLE Sensation: WNL    Cervical / Trunk Assessment Cervical / Trunk Assessment: Normal  Communication   Communication: Expressive difficulties(low toned)  Cognition Arousal/Alertness: Awake/alert Behavior During Therapy: WFL for tasks assessed/performed Overall Cognitive Status: Impaired/Different from baseline Area of Impairment: Safety/judgement;Awareness;Following commands;Attention;Problem solving;Orientation                 Orientation Level: Time("June") Current Attention Level: Sustained   Following Commands: Follows one step commands with increased time Safety/Judgement: Decreased awareness of safety;Decreased awareness of deficits Awareness: Emergent Problem Solving: Slow processing;Requires verbal cues;Difficulty sequencing General Comments: Pt found sitting in stool; pt initally reports June, but to correct to May with cueing; requires min verbal cueing to attend to task and noted increased cueing for sequencing and slow processing      General Comments General comments (skin integrity, edema, etc.): Son present during session.    Exercises     Assessment/Plan    PT Assessment Patient needs continued PT services  PT Problem List Decreased strength;Decreased balance;Decreased mobility;Decreased cognition;Decreased safety awareness;Decreased coordination       PT Treatment Interventions Stair training;Gait training;DME instruction;Functional mobility training;Therapeutic activities;Therapeutic exercise;Balance training;Patient/family education;Cognitive  remediation;Neuromuscular re-education    PT Goals (Current goals can be found in the Care Plan section)  Acute Rehab PT Goals Patient Stated Goal: to go to rehab PT Goal Formulation: With patient/family Time For Goal Achievement: 07/31/19 Potential to Achieve Goals: Good    Frequency Min 4X/week   Barriers to discharge        Co-evaluation PT/OT/SLP Co-Evaluation/Treatment: Yes Reason for Co-Treatment: For patient/therapist safety;To address functional/ADL transfers PT goals addressed during session: Mobility/safety with mobility;Balance OT goals addressed during session: ADL's and self-care;Proper use of Adaptive equipment and DME       AM-PAC PT "6 Clicks" Mobility  Outcome Measure Help needed turning from your back to your side while in a flat bed without using bedrails?: A Little Help needed moving from lying on your back to sitting on the side of a flat bed without using bedrails?: A Lot Help needed moving to and from a bed to a chair (including a wheelchair)?: A Lot Help needed standing up from a chair using your arms (e.g., wheelchair or bedside chair)?: A Little Help needed to walk in hospital room?: A Lot Help needed climbing 3-5 steps with a railing? : Total 6 Click Score: 13    End of Session Equipment Utilized During Treatment: Gait belt Activity Tolerance: Patient tolerated treatment well Patient left: in chair;with call bell/phone within reach;with chair alarm set Nurse Communication: Mobility status PT Visit Diagnosis: Unsteadiness on feet (R26.81);Hemiplegia and hemiparesis;Difficulty in walking, not elsewhere classified (R26.2) Hemiplegia - Right/Left: Right Hemiplegia - dominant/non-dominant: Dominant Hemiplegia - caused by: Cerebral infarction    [id number] PT Time Calculation (min) (ACUTE ONLY): 34 min   Charges:   PT Evaluation $PT Eval Moderate Complexity: 1 Mod  Zettie Cooley, DPT Acute Rehabilitation Services Pager  [phone] Office [phone]      [place] 07/17/2019, 11:27 AM

## 2019-07-17 NOTE — Consult Note (Signed)
Physical Medicine and Rehabilitation Consult Reason for Consult: Right side weakness Referring Physician: Internal medicine   HPI: Justin [name] is a 79 y.o. 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 Per chart review patient lives with spouse. One level home three steps to entry. Presented 07/16/2019 with right side weakness and numbness. CT/MRI showed acute subcentimeter infarct of the left corona radiata centrum semiovale. Expected evolution of recent infarct. Most recent echocardiogram with ejection fraction of [id number] grade 1 diastolic dysfunction. Patient currently remains on aspirin and Plavix for CVA prophylaxis. Subcutaneous Lovenox for DVT prophylaxis. Therapy evaluations completed recommendations of physical medicine rehab consult.   Pt reports RUE OK, but weak in leg- cannot walk- Denies pain Wife said trying to change blood thinner- is expensive, but trying to get via [place].    Review of Systems  Constitutional: Negative for chills and fever.  HENT: Negative for hearing loss.   Eyes: Negative for blurred vision and double vision.  Respiratory: Negative for cough and shortness of breath.   Cardiovascular: Negative for chest pain, palpitations and leg swelling.  Gastrointestinal: Positive for constipation. Negative for heartburn, nausea and vomiting.  Genitourinary: Positive for urgency. Negative for dysuria, flank pain and hematuria.  Musculoskeletal: Positive for joint pain and myalgias.  Skin: Negative for rash.  Neurological: Positive for sensory change and weakness.  Psychiatric/Behavioral: Positive for depression.  The patient has insomnia.   All other systems reviewed and are negative.  Past Medical History:  Diagnosis Date  . Brain bleed ([place])   . Cancer [facility])    prostate  . 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 Depression   . Diabetes mellitus without complication ([place])   . High cholesterol   . Hypertension   . Stroke [facility])    Past Surgical History:  Procedure Laterality Date  . arm & hand surgery Left 1972   skill saw accident  . COLON SURGERY     partial  . EYE SURGERY     lens implant and cateract- Left  . PROSTATECTOMY     Family History  Problem Relation Age of Onset  . CAD Mother   . Diabetes Mother   . Emphysema Father   . Diabetes Sister   . Diabetes Brother   . Diabetes Sister   . Diabetes Brother   . Diabetes Mellitus II Neg Hx    Social History:  reports that he has never smoked. He has never used smokeless tobacco. He reports that he does not drink alcohol or use drugs. Allergies: No Known Allergies Medications Prior to Admission  Medication Sig Dispense Refill  . amLODipine (NORVASC) 5 MG tablet Take 0.5 tablets (2.5 mg total) by mouth daily. (Patient taking differently: Take 5 mg by mouth daily. ) 90 tablet 0  . aspirin EC 81 MG EC tablet Take 1 tablet (81 mg total) by mouth daily. 90 tablet 0  . atorvastatin (LIPITOR) 80 MG tablet TAKE 1 TABLET EVERY DAY (Patient taking differently: Take 80 mg by mouth daily. ) 90 tablet 0  . Cholecalciferol (VITAMIN D-1000 MAX ST) 1000 units tablet Take 1,000 Units by mouth daily.    . clopidogrel (PLAVIX) 75 MG tablet  Take 1 tablet (75 mg total) by mouth daily. 90 tablet 4  . cyanocobalamin ([employer] VITAMIN B12) 1000 MCG tablet Take 1,000 mcg by mouth daily.     Marland Kitchen docusate sodium (COLACE) 100 MG capsule Take 1 capsule (100 mg total) by mouth 3 (three) times daily. 90 capsule 0  . fenofibrate 160 MG tablet Take 160 mg by mouth daily.    . furosemide (LASIX) 20 MG tablet Take 20 mg by mouth daily.     Marland Kitchen glipiZIDE  (GLUCOTROL) 5 MG tablet Take 0.5 tablets (2.5 mg total) by mouth daily before breakfast. (Patient taking differently: Take 5 mg by mouth every evening. ) 30 tablet 0  . hydrocerin (EUCERIN) CREA Apply 1 application topically 2 (two) times daily. 454 g 0  . hydrochlorothiazide (HYDRODIURIL) 25 MG tablet Take 1 tablet (25 mg total) by mouth daily.    Marland Kitchen losartan (COZAAR) 100 MG tablet Take 100 mg by mouth daily.    . metFORMIN (GLUCOPHAGE) 500 MG tablet Take 1 tablet (500 mg total) by mouth 2 (two) times daily with a meal.    . Omega-3 Fatty Acids (FISH OIL) 1000 MG CAPS Take 1,000 mg by mouth daily with breakfast.    . polyvinyl alcohol (LIQUIFILM TEARS) 1.4 % ophthalmic solution Place 1 drop into both eyes 4 (four) times daily as needed for dry eyes.    . Semaglutide (OZEMPIC) 0.25 or 0.5 MG/DOSE SOPN Inject 0.25 mg into the skin once a week.    . senna-docusate (SENOKOT-S) 8.6-50 MG tablet Take 2 tablets by mouth at bedtime.    . sertraline (ZOLOFT) 25 MG tablet Take 1 tablet (25 mg total) by mouth daily. 90 tablet 1    Home: Home Living Family/patient expects to be discharged to:: Private residence Living Arrangements: Spouse/significant other Available Help at Discharge: Family, Available 24 hours/day Type of Home: House Home Access: Stairs to enter [employer] of Steps: 3 Entrance Stairs-Rails: Right Home Layout: One level Bathroom Shower/Tub: Multimedia programmer: Handicapped height Bathroom Accessibility: Yes Home Equipment: Engineer, manufacturing held shower head, Shower seat, Radio producer - quad, Environmental consultant - 2 wheels Additional Comments: wife can provide 24/7 supervision  Lives With: Spouse  Functional History: Prior Function Level of Independence: Needs assistance Gait / Transfers Assistance Needed: Does his own ADLs. Uses RW for ambulation; no falls since discharge from CIR last week. ADL's / Homemaking Assistance Needed: Wife assists with IADLs and medication management. Functional  Status:  Mobility: Bed Mobility Overal bed mobility: Needs Assistance Bed Mobility: Sidelying to Sit, Rolling Rolling: Min guard Sidelying to sit: Mod assist, +2 for physical assistance, +2 for safety/equipment, HOB elevated General bed mobility comments: assist for trunk support to sitting EOB towards L side  Transfers Overall transfer level: Needs assistance Equipment used: Rolling walker (2 wheeled) Transfers: Sit to/from Stand, Stand Pivot Transfers Sit to Stand: Min assist, +2 physical assistance, +2 safety/equipment Stand pivot transfers: Mod assist, +2 physical assistance General transfer comment: initally min assist +2 safety, fading with fatigue to min +2 physical assist. Stood multiple times for peri-hygiene as pt found sitting in stool. Took a few pivotal steps to chair but difficulty advancing RLE despite assist with weight shifting and RW management; right lateral lean.      ADL: ADL Overall ADL's : Needs assistance/impaired Grooming: Minimal assistance, Sitting Upper Body Bathing: Supervision/ safety, Set up, Sitting Lower Body Bathing: Maximal assistance, Sit to/from stand, +2 for physical assistance, +2 for safety/equipment Upper Body Dressing : Sitting, Minimal assistance  Lower Body Dressing: Total assistance, +2 for physical assistance, +2 for safety/equipment, Sitting/lateral leans, Sit to/from stand Toilet Transfer: Moderate assistance, +2 for safety/equipment, RW, Stand-pivot, +2 for physical assistance Toilet Transfer Details (indicate cue type and reason): simulated to recliner  Toileting- Clothing Manipulation and Hygiene: Total assistance, Bed level Toileting - Clothing Manipulation Details (indicate cue type and reason): incontient of BM in bed Functional mobility during ADLs: Minimal assistance, Moderate assistance, +2 for physical assistance, +2 for safety/equipment General ADL Comments: pt limited by R sided weakness and impaired coordination, decreased  safety awareness, impaired balance   Cognition: Cognition Overall Cognitive Status: Impaired/Different from baseline Orientation Level: Oriented X4 Cognition Arousal/Alertness: Awake/alert Behavior During Therapy: WFL for tasks assessed/performed Overall Cognitive Status: Impaired/Different from baseline Area of Impairment: Safety/judgement, Awareness, Following commands, Attention, Problem solving, Orientation Orientation Level: Time("June") Current Attention Level: Sustained Following Commands: Follows one step commands with increased time Safety/Judgement: Decreased awareness of safety, Decreased awareness of deficits Awareness: Emergent Problem Solving: Slow processing, Requires verbal cues, Difficulty sequencing General Comments: Pt found sitting in stool; pt initally reports June, but to correct to May with cueing; requires min verbal cueing to attend to task and noted increased cueing for sequencing and slow processing  Blood pressure 135/75, pulse 60, temperature 97.9 F (36.6 C), temperature source Oral, resp. rate 17, height 6' (1.829 m), weight 100.1 kg, SpO2 98 %. Physical Exam  Nursing note and vitals reviewed. Constitutional: He appears well-developed and well-nourished.  Older gentleman, sitting up in bed; watching TV- low energy; , wife in room, NAD  HENT:  Head: Normocephalic and atraumatic.  Nose: Nose normal.  Mouth/Throat: Oropharynx is clear and moist. No oropharyngeal exudate.  R tongue deviation- mild Mild R facial droop- improved with smile  Eyes: Conjunctivae are normal. Right eye exhibits no discharge. Left eye exhibits no discharge. No scleral icterus.  EOMI B/L- no nystagmus  Neck: No tracheal deviation present.  Cardiovascular:  RRR- no JVD  Respiratory: No stridor.  CTA B/L- no W/R/R- good air movement   GI:  Soft, NT, ND, (+)BS    Musculoskeletal:     Cervical back: Normal range of motion and neck supple.     Comments: RUE- 5- to 4+/5 in  deltoid, biceps, triceps, WE, grip and finger bad LUE- 5-/5 in same muscles RLE- HF 3-/5, KE and KF 4+/5, DF and PF 5-/5 LLE- 5-5 in same muscles   Neurological: He is alert.  Patient is alert in no acute distress. Makes eye contact with examiner. Oriented to person place and year and follows simple commands. Appropriate, c/o numbness from R hip to toes- decreased sensation to Light touch in RLE nml sensation otherwise  Skin: Skin is warm and dry.  No skin breakdown seen IV L forearm- no infiltration  Psychiatric:  Low energy; appropriate    Results for orders placed or performed during the hospital encounter of 07/16/19 (from the past 24 hour(s))  CBG monitoring, ED     Status: Abnormal   Collection Time: 07/16/19 12:49 PM  Result Value Ref Range   Glucose-Capillary 118 (H) 70 - 99 mg/dL  CBG monitoring, ED     Status: Abnormal   Collection Time: 07/16/19 12:49 PM  Result Value Ref Range   Glucose-Capillary 118 (H) 70 - 99 mg/dL  Ethanol     Status: None   Collection Time: 07/16/19  2:04 PM  Result Value Ref Range   Alcohol, Ethyl (B) <10 <10 mg/dL  CBC with Differential/Platelet  Status: Abnormal   Collection Time: 07/16/19  2:04 PM  Result Value Ref Range   WBC 11.0 (H) 4.0 - 10.5 K/uL   RBC 5.39 4.22 - 5.81 MIL/uL   Hemoglobin 15.0 13.0 - 17.0 g/dL   HCT 45.6 39.0 - 52.0 %   MCV 84.6 80.0 - 100.0 fL   MCH 27.8 26.0 - 34.0 pg   MCHC 32.9 30.0 - 36.0 g/dL   RDW 12.6 11.5 - 15.5 %   Platelets 267 150 - 400 K/uL   nRBC 0.0 0.0 - 0.2 %   Neutrophils Relative % 73 %   Neutro Abs 7.8 (H) 1.7 - 7.7 K/uL   Lymphocytes Relative 19 %   Lymphs Abs 2.1 0.7 - 4.0 K/uL   Monocytes Relative 7 %   Monocytes Absolute 0.8 0.1 - 1.0 K/uL   Eosinophils Relative 1 %   Eosinophils Absolute 0.2 0.0 - 0.5 K/uL   Basophils Relative 0 %   Basophils Absolute 0.0 0.0 - 0.1 K/uL   Immature Granulocytes 0 %   Abs Immature Granulocytes 0.04 0.00 - 0.07 K/uL  Comprehensive metabolic panel      Status: Abnormal   Collection Time: 07/16/19  2:04 PM  Result Value Ref Range   Sodium 140 135 - 145 mmol/L   Potassium 4.1 3.5 - 5.1 mmol/L   Chloride 103 98 - 111 mmol/L   CO2 26 22 - 32 mmol/L   Glucose, Bld 95 70 - 99 mg/dL   BUN 20 8 - 23 mg/dL   Creatinine, Ser 0.86 0.61 - 1.24 mg/dL   Calcium 9.3 8.9 - 10.3 mg/dL   Total Protein 6.3 (L) 6.5 - 8.1 g/dL   Albumin 3.5 3.5 - 5.0 g/dL   AST 19 15 - 41 U/L   ALT 22 0 - 44 U/L   Alkaline Phosphatase 36 (L) 38 - 126 U/L   Total Bilirubin 0.8 0.3 - 1.2 mg/dL   GFR calc non Af Amer >60 >60 mL/min   GFR calc Af Amer >60 >60 mL/min   Anion gap 11 5 - 15  Protime-INR     Status: None   Collection Time: 07/16/19  2:04 PM  Result Value Ref Range   Prothrombin Time 13.5 11.4 - 15.2 seconds   INR 1.1 0.8 - 1.2  APTT     Status: Abnormal   Collection Time: 07/16/19  2:04 PM  Result Value Ref Range   aPTT 21 (L) 24 - 36 seconds  I-stat chem 8, ED     Status: Abnormal   Collection Time: 07/16/19  2:16 PM  Result Value Ref Range   Sodium 138 135 - 145 mmol/L   Potassium 4.1 3.5 - 5.1 mmol/L   Chloride 101 98 - 111 mmol/L   BUN 26 (H) 8 - 23 mg/dL   Creatinine, Ser 0.90 0.61 - 1.24 mg/dL   Glucose, Bld 92 70 - 99 mg/dL   Calcium, Ion 1.12 (L) 1.15 - 1.40 mmol/L   TCO2 30 22 - 32 mmol/L   Hemoglobin 14.6 13.0 - 17.0 g/dL   HCT 43.0 39.0 - 52.0 %  SARS Coronavirus 2 by RT PCR (hospital order, performed in [facility] hospital lab) Nasopharyngeal Nasopharyngeal Swab     Status: None   Collection Time: 07/16/19  6:58 PM   Specimen: Nasopharyngeal Swab  Result Value Ref Range   SARS Coronavirus 2 NEGATIVE NEGATIVE  Glucose, capillary     Status: Abnormal   Collection Time: 07/16/19  9:17  PM  Result Value Ref Range   Glucose-Capillary 68 (L) 70 - 99 mg/dL  Glucose, capillary     Status: Abnormal   Collection Time: 07/16/19  9:46 PM  Result Value Ref Range   Glucose-Capillary 63 (L) 70 - 99 mg/dL  Glucose, capillary     Status:  None   Collection Time: 07/16/19 10:07 PM  Result Value Ref Range   Glucose-Capillary 91 70 - 99 mg/dL  CBC     Status: None   Collection Time: 07/17/19  6:23 AM  Result Value Ref Range   WBC 7.5 4.0 - 10.5 K/uL   RBC 5.02 4.22 - 5.81 MIL/uL   Hemoglobin 13.8 13.0 - 17.0 g/dL   HCT 41.9 39.0 - 52.0 %   MCV 83.5 80.0 - 100.0 fL   MCH 27.5 26.0 - 34.0 pg   MCHC 32.9 30.0 - 36.0 g/dL   RDW 12.6 11.5 - 15.5 %   Platelets 241 150 - 400 K/uL   nRBC 0.0 0.0 - 0.2 %  Basic metabolic panel     Status: Abnormal   Collection Time: 07/17/19  6:23 AM  Result Value Ref Range   Sodium 139 135 - 145 mmol/L   Potassium 3.5 3.5 - 5.1 mmol/L   Chloride 104 98 - 111 mmol/L   CO2 27 22 - 32 mmol/L   Glucose, Bld 98 70 - 99 mg/dL   BUN 14 8 - 23 mg/dL   Creatinine, Ser 0.72 0.61 - 1.24 mg/dL   Calcium 8.6 (L) 8.9 - 10.3 mg/dL   GFR calc non Af Amer >60 >60 mL/min   GFR calc Af Amer >60 >60 mL/min   Anion gap 8 5 - 15  Glucose, capillary     Status: None   Collection Time: 07/17/19  6:24 AM  Result Value Ref Range   Glucose-Capillary 93 70 - 99 mg/dL   Comment 1 Notify RN    Comment 2 Document in Chart   Glucose, capillary     Status: None   Collection Time: 07/17/19  8:42 AM  Result Value Ref Range   Glucose-Capillary 94 70 - 99 mg/dL   CT HEAD WO CONTRAST  Result Date: 07/16/2019 CLINICAL DATA:  New right-sided deficit, recent stroke, last known well 2:30 p.m. yesterday, right leg weakness since last evening with new inability to ambulate today EXAM: CT HEAD WITHOUT CONTRAST TECHNIQUE: Contiguous axial images were obtained from the base of the skull through the vertex without intravenous contrast. COMPARISON:  06/24/2019 FINDINGS: Brain: Diffuse hypodensities are seen throughout the periventricular white matter, consistent with chronic small vessel ischemic change. Chronic ischemic changes bilateral basal ganglia, left greater than right. No new infarct or hemorrhage. Lateral ventricles and  midline structures are stable. No acute extra-axial fluid collections. No mass effect. Vascular: No hyperdense vessel or unexpected calcification. Skull: Normal. Negative for fracture or focal lesion. Sinuses/Orbits: No acute finding. Other: None. IMPRESSION: 1. Stable chronic small-vessel ischemic changes throughout the periventricular white matter and bilateral basal ganglia. 2. No evidence of acute infarct or hemorrhage. Electronically Signed   By: Randa Ngo M.D.   On: 07/16/2019 16:17   MR BRAIN WO CONTRAST  Result Date: 07/16/2019 CLINICAL DATA:  Decreased sensation and use of right arm EXAM: MRI HEAD WITHOUT CONTRAST TECHNIQUE: Multiplanar, multiecho pulse sequences of the brain and surrounding structures were obtained without intravenous contrast. COMPARISON:  06/24/2019 FINDINGS: Brain: There is a new subcentimeter focus of restricted diffusion in the left corona radiata/centrum semiovale.  Expected evolution of right corona radiata/centrum semiovale infarcts. Confluent areas of T2 hyperintensity in the supratentorial white matter are nonspecific but probably reflect advanced chronic microvascular ischemic changes. There are chronic bilateral infarcts of the corona radiata. Prominence of ventricles and sulci reflects stable generalized parenchymal volume loss superimposed on ex vacuo dilatation related to above. Stable foci of susceptibility reflecting chronic blood products. There is no intracranial mass or mass effect. Vascular: Major vessel flow voids at the skull base are preserved. Skull and upper cervical spine: Normal marrow signal is preserved. Sinuses/Orbits: Minor mucosal thickening.  Left lens replacement. Other: Sella is unremarkable.  Mastoid air cells are clear. IMPRESSION: Acute subcentimeter infarct of the left corona radiata/centrum semiovale. Expected evolution of recent infarcts seen on the prior study. Stable additional chronic findings detailed above. Electronically Signed   By:  Macy Mis M.D.   On: 07/16/2019 16:43     Assessment/Plan: Diagnosis: New L corona radiata/centrum semiovale stroke with RLE weakness with recent R MCA stroke and L hemiparesis- residual weakness 1. Does the need for close, 24 hr/day medical supervision in concert with the patient's rehab needs make it unreasonable for this patient to be served in a less intensive setting? Yes 2. Co-Morbidities requiring supervision/potential complications: PVD, HTN, HLD, OSA needs CPAP, DM II 3. Due to bladder management, bowel management, safety, skin/wound care, disease management, medication administration, pain management and patient education, does the patient require 24 hr/day rehab nursing? Yes 4. Does the patient require coordinated care of a physician, rehab nurse, therapy disciplines of OT and PT to address physical and functional deficits in the context of the above medical diagnosis(es)? Yes Addressing deficits in the following areas: balance, endurance, locomotion, strength, transferring, bathing, dressing, feeding, grooming and toileting 5. Can the patient actively participate in an intensive therapy program of at least 3 hrs of therapy per day at least 5 days per week? Yes 6. The potential for patient to make measurable gains while on inpatient rehab is good 7. Anticipated functional outcomes upon discharge from inpatient rehab are supervision and min assist  with PT, modified independent and supervision with OT, n/a with SLP. 8. Estimated rehab length of stay to reach the above functional goals is: 10-14 days 9. Anticipated discharge destination: Home 10. Overall Rehab/Functional Prognosis: good  RECOMMENDATIONS: This patient's condition is appropriate for continued rehabilitative care in the following setting: CIR Patient has agreed to participate in recommended program. Potentially Note that insurance prior authorization may be required for reimbursement for recommended care.  Comment:   1. Pt has recurrent new stroke affecting opposite side- due to severe vascular disease- they plan on changing his blood thinner due to 2 strokes in 30 days period.  2. Is a veteran- can hopefully get new blood thinner from there 3. Pt has RLE weakness and sensory deficits, fyi 4. Pt is mod A to mod A of 2 currently 5. Will submit for insurance approval- thank you for this consult.     Lavon Paganini Angiulli, PA-C 07/17/2019   I have personally performed a face to face diagnostic evaluation of this patient and formulated the key components of the plan.  Additionally, I have personally reviewed laboratory data, imaging studies, as well as relevant notes and concur with the physician assistant's documentation above.

## 2019-07-17 NOTE — Progress Notes (Signed)
PT Cancellation Note  Patient Details Name: Justin [name] MRN: [id number] DOB: 01-Jul-1940   Cancelled Treatment:    Reason Eval/Treat Not Completed: Active bedrest order Will await increase in activity orders prior to PT evaluation. Will follow.   [name] 07/17/2019, 7:03 AM Marisa Severin, PT, DPT Acute Rehabilitation Services Pager [phone] Office [phone]

## 2019-07-17 NOTE — Progress Notes (Signed)
Patient refused his CPAP for tonight.  

## 2019-07-17 NOTE — Progress Notes (Signed)
OT Cancellation Note  Patient Details Name: WHITT HAVARD MRN: [id number] DOB: Jul 04, 1940   Cancelled Treatment:    Reason Eval/Treat Not Completed: Active bedrest order. Will follow and see as able when activity orders increase.    Jolaine Artist, OT Acute Rehabilitation Services Pager [phone] Office [phone]    Delight Stare 07/17/2019, 8:04 AM

## 2019-07-17 NOTE — Progress Notes (Signed)
Pt requesting to ambulate in the morning next shift. "I don't think I can stand up". When asked if he wanted to try to see if he could stand, pt request to amb during next shift.

## 2019-07-17 NOTE — Progress Notes (Signed)
STROKE TEAM PROGRESS NOTE   INTERVAL HISTORY He is up in the chair at the bedside.  His wife arrived after rounds.  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  He was recently discharged with a stroke in April 2021 involving right brain.  This time he was readmitted with right-sided weakness and MRI shows left frontal subcortical infarct.  Is currently on aspirin and has been on aspirin and Plavix in the past.  Vitals:   07/17/19 0412 07/17/19 0414 07/17/19 0539 07/17/19 0843  BP: 127/67  132/67 135/75  Pulse: (!) 55  (!) 53 60  Resp: 16  16 17   Temp: 98 F (36.7 C)  97.6 F (36.4 C) 97.9 F (36.6 C)  TempSrc: Axillary  Axillary Oral  SpO2: 100%  99% 98%  Weight:  100.1 kg    Height:        CBC:  Recent Labs  Lab 07/16/19 1404 07/16/19 1404 07/16/19 1416 07/17/19 0623  WBC 11.0*  --   --  7.5  NEUTROABS 7.8*  --   --   --   HGB 15.0   < > 14.6 13.8  HCT 45.6   < > 43.0 41.9  MCV 84.6  --   --  83.5  PLT 267  --   --  241   < > = values in this interval not displayed.    Basic Metabolic Panel:  Recent Labs  Lab 07/16/19 1404 07/16/19 1404 07/16/19 1416 07/17/19 0623  NA 140   < > 138 139  K 4.1   < > 4.1 3.5  CL 103   < > 101 104  CO2 26  --   --  27  GLUCOSE 95   < > 92 98  BUN 20   < > 26* 14  CREATININE 0.86   < > 0.90 0.72  CALCIUM 9.3  --   --  8.6*   < > = values in this interval not displayed.   Lipid Panel:     Component Value Date/Time   CHOL 103 06/25/2019 0415   TRIG 136 06/25/2019 0415   HDL 28 (L) 06/25/2019 0415   CHOLHDL 3.7 06/25/2019 0415   VLDL 27 06/25/2019 0415   LDLCALC 48 06/25/2019 0415   HgbA1c:  Lab Results  Component Value Date   HGBA1C 7.2 (H) 06/25/2019   Urine Drug Screen:     Component Value Date/Time   LABOPIA NONE DETECTED 06/24/2019 1131   COCAINSCRNUR NONE DETECTED 06/24/2019  1131   LABBENZ NONE DETECTED 06/24/2019 1131   AMPHETMU NONE DETECTED 06/24/2019 1131   THCU NONE DETECTED 06/24/2019 1131   LABBARB NONE DETECTED 06/24/2019 1131    Alcohol Level     Component Value Date/Time   ETH <10 07/16/2019 1404    IMAGING past 24 hours CT HEAD WO CONTRAST  Result Date: 07/16/2019 CLINICAL DATA:  New right-sided deficit, recent stroke, last known well 2:30 p.m. yesterday, right leg weakness since last evening with new inability to ambulate today EXAM: CT HEAD WITHOUT CONTRAST TECHNIQUE: Contiguous axial images were obtained from the base of the skull through the vertex without intravenous contrast. COMPARISON:  06/24/2019 FINDINGS: Brain: Diffuse hypodensities are seen throughout the periventricular white matter, consistent with chronic small vessel ischemic change. Chronic ischemic changes bilateral basal ganglia, left greater than right. No new infarct or  hemorrhage. Lateral ventricles and midline structures are stable. No acute extra-axial fluid collections. No mass effect. Vascular: No hyperdense vessel or unexpected calcification. Skull: Normal. Negative for fracture or focal lesion. Sinuses/Orbits: No acute finding. Other: None. IMPRESSION: 1. Stable chronic small-vessel ischemic changes throughout the periventricular white matter and bilateral basal ganglia. 2. No evidence of acute infarct or hemorrhage. Electronically Signed   By: Randa Ngo M.D.   On: 07/16/2019 16:17   MR BRAIN WO CONTRAST  Result Date: 07/16/2019 CLINICAL DATA:  Decreased sensation and use of right arm EXAM: MRI HEAD WITHOUT CONTRAST TECHNIQUE: Multiplanar, multiecho pulse sequences of the brain and surrounding structures were obtained without intravenous contrast. COMPARISON:  06/24/2019 FINDINGS: Brain: There is a new subcentimeter focus of restricted diffusion in the left corona radiata/centrum semiovale. Expected evolution of right corona radiata/centrum semiovale infarcts. Confluent  areas of T2 hyperintensity in the supratentorial white matter are nonspecific but probably reflect advanced chronic microvascular ischemic changes. There are chronic bilateral infarcts of the corona radiata. Prominence of ventricles and sulci reflects stable generalized parenchymal volume loss superimposed on ex vacuo dilatation related to above. Stable foci of susceptibility reflecting chronic blood products. There is no intracranial mass or mass effect. Vascular: Major vessel flow voids at the skull base are preserved. Skull and upper cervical spine: Normal marrow signal is preserved. Sinuses/Orbits: Minor mucosal thickening.  Left lens replacement. Other: Sella is unremarkable.  Mastoid air cells are clear. IMPRESSION: Acute subcentimeter infarct of the left corona radiata/centrum semiovale. Expected evolution of recent infarcts seen on the prior study. Stable additional chronic findings detailed above. Electronically Signed   By: Macy Mis M.D.   On: 07/16/2019 16:43    PHYSICAL EXAM Pleasant elderly Caucasian male not in distress. . Afebrile. Head is nontraumatic. Neck is supple without bruit.    Cardiac exam no murmur or gallop. Lungs are clear to auscultation. Distal pulses are well felt. Neurological Exam : He is awake alert oriented to time place and person.  Speech is slightly hypophonic and slow and nonfluent but l without dysarthria or aphasia.  Slightly diminished attention, registration and recall.  Follows 1 and two-step commands.  Extraocular movements are full range without nystagmus.  Face is symmetric.  Tongue midline.  Motor system exam shows mild right hand grip weakness as well as right leg proximal hip flexion and ankle dorsiflexor weakness.  Mild tremor of outstretched upper extremity left greater than right.  Sensation preserved bilaterally deep tendon reflexes are 2+ symmetric.  Plantars downgoing.  Gait not tested. ASSESSMENT/PLAN Justin [name] is a 79 y.o. male with  history of HTN, HLD, DB and multiple prior strokes presenting with right-sided numbness, tingling and weakness .   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  Present infarct likely secondary to small vessel disease though he has known known large vessel disease and cryptogenic etiology for his prior strokes  CT head No acute abnormality. Small vessel disease.   MRI  Acute L corona radiata / centrum semiovale infarct. Expected evolution of previous infarcts.  Lovenox 40 mg sq daily for VTE prophylaxis  aspirin 325 mg daily and clopidogrel 75 mg daily prior to admission for 3 months starting 06/24/2019 given intracranial atherosclerosis, now on aspirin 325 mg daily and clopidogrel 75 mg daily. Continue for 3 months and end with plavix.   Therapy recommendations:  CIR  Disposition:  pending   Hx stroke/TIA  06/24/2019 - 2 small R semiovale  and MCA/ACA border zone small infarcts likely secondary to small vessel disease source. DAPT x 3 mos -> plavix. 30d monitor    09/2018 -admitted for confusion and slurry speech, MRI showed leftcorona radiatainfarct likely due tosmall vessel disease source in setting of intracranial atherosclerosis (L M2 high-grade stenosis).  MRA head and neck showed right P1, bilateral A2, left M2 stenosis.  EF 60 to 65%.  LDL 48 and A1c 6.9.  Put on DAPT x 3 mos then plavix alone.  Also Lipitor 80.  08/2017 admitted for right arm weakness. MRI showed left BG infarct. MRA showed left P1 high-grade stenosis with robust Pcom. Carotid Doppler negative. EF 60 to 65%. LDL 115 and A1c 7.7. Patient discharged with aspirin 325 and Plavix 75 and Lipitor 80.  Hypertension  Stable . Permissive hypertension (OK if < 220/120) but gradually normalize in 5-7 days . Long-term BP goal normotensive  Hyperlipidemia  Home meds:  lipitor 80 and fish oil, Lipitor resumed in hospital  LDL 48, goal < 70  Continue statin and fish oil at  discharge  Diabetes type II Uncontrolled  HgbA1c 7.2, goal < 7.0  Other Stroke Risk Factors  Advanced age  Obesity, Body mass index is 29.93 kg/m., recommend weight loss, diet and exercise as appropriate   Hx stroke/TIA - see above  obstructive sleep apnea on CPAP at hs  Other Active Problems    Hospital day # 1  Patient has had multiple recurrent strokes over the last 2 to 3 years both suggestive of small vessel etiology as well as cryptogenic etiology and has known intracranial atherosclerosis as well.  Recommend continue aspirin and Plavix unless he can afford the switch to aspirin and Brilinta which may be more expensive.  Continue aggressive risk factor modification.  Patient counseled to be compliant with using his CPAP every night for his sleep apnea.  No need for loop recorder since most of his strokes have been clearly from small vessel disease related to his intracranial atherosclerosis.  Patient will also benefit with referral to inpatient rehab for ongoing therapy needs.  Continue ongoing therapies.  Long discussion at the bedside with the patient and wife and answered questions.  Discussed with Dr. Darrick Meigs from internal medicine teaching service. greater than 50% time during this 35-minute visit was spent on counseling and coordination of care about his recurrent strokes and discussion with care team Antony Contras, MD To contact Stroke Continuity provider, please refer to [email or web address] After hours, contact General Neurology

## 2019-07-17 NOTE — Progress Notes (Addendum)
NAME:  Justin [name], MRN:  [id number], DOB:  07/31/1940, LOS: 1 ADMISSION DATE:  07/16/2019   Brief History  79 yo male significant history of strokes (left basal ganglia in 08/2017, left corona radiata in 09/2018, and right MCA 06/2019), HTN, HLD, OSA. Presented to [facility] 5/24 for 1d history of RLE weakness.  Recently discharged from [facility] on Friday prior to admission, following his most recent CVA that occurred the beginning of May. Complete stroke workup was performed at that time which showed severe vascular disease. He has been on aspirin and plavix since that time.  Subjective  No overnight events. Patient reports feeling pretty good today. He worked with PT.   Significant Hospital Events   5/24 admission. MRI showing acute infarct 5/25 therapy recommending CIR  Objective   Blood pressure 132/67, pulse (!) 53, temperature 97.6 F (36.4 C), temperature source Axillary, resp. rate 16, height 6' (1.829 m), weight 100.1 kg, SpO2 99 %.     Intake/Output Summary (Last 24 hours) at 07/17/2019 0609 Last data filed at 07/17/2019 0538 Gross per 24 hour  Intake --  Output 950 ml  Net -950 ml   Filed Weights   07/16/19 1252 07/16/19 2116 07/17/19 0414  Weight: 99.8 kg 99 kg 100.1 kg    Examination: GENERAL: in no acute distress CARDIAC: heart RRR.  PULMONARY: breathing comfortable on room air.  NEURO: a/o. CN II-XII intact. 5/5 strength in all four extremities with slight deficit of RLE however improved from yesterday slightly  Consults:  neurology  Significant Diagnostic Tests:  5/24 MRI brain: acute subcentimeter infarct of the left corona radiata/centrum semiovale. Normal evolution of prior infarcts observed.  Micro Data:  none  Antimicrobials:  None.   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 Repeat stroke workup deferred due to it  having just been done the beginning of May. Awaiting therapy recommendations for disposition planning.  Assessment & Plan:  Active Problems:   CVA (cerebral vascular accident) ([place])  Acute CVA with infarct involving left corona radiata with residual RLE weakness. Neurology on board Cleared by speech Plan --already on plavix and aspirin from his recent stroke the beginning of this month--will discuss transitioning to alternative agent, such as brillinta, with neurology. Continue aspirin and statin --May benefit from outpatient holter monitor.  --Will likely need repeat stay in CIR for continued rehab--awaiting therapy recommendations  HTN/HLD. Continue to hold home antihypertensives to allow for permissive HTN.  Blood pressures are actually normotensive at this time. Can consider restarting tomorrow morning. Continue statin and fenofibrate.  T2DM. One hypoglycemic episode last evening to 63. Likely due to NPO status. Suspect to see increase in CBGs so will adjust insulin accordingly.  OSA. Continue CPAP HS.  Best practice:  CODE STATUS: DNR Diet: heart healthy DVT for prophylaxis: lovenox Social considerations/Family communication: wife Dispo: pending PT/OT eval   [name], MD INTERNAL MEDICINE RESIDENT PGY-1 PAGER #: [phone] 07/17/19  6:09 AM  Labs    CBC Latest Ref Rng & Units 07/16/2019 07/16/2019 07/09/2019  WBC 4.0 - 10.5 K/uL - 11.0(H) 9.9  Hemoglobin 13.0 - 17.0 g/dL 14.6 15.0 15.5  Hematocrit 39.0 - 52.0 % 43.0 45.6 45.8  Platelets 150 - 400 K/uL - 267 305   BMP Latest Ref Rng & Units 07/16/2019 07/16/2019 07/09/2019  Glucose 70 - 99 mg/dL  92 95 108(H)  BUN 8 - 23 mg/dL 26(H) 20 32(H)  Creatinine 0.61 - 1.24 mg/dL 0.90 0.86 1.11  Sodium 135 - 145 mmol/L 138 140 135  Potassium 3.5 - 5.1 mmol/L 4.1 4.1 4.0  Chloride 98 - 111 mmol/L 101 103 97(L)  CO2 22 - 32 mmol/L - 26 26  Calcium 8.9 - 10.3 mg/dL - 9.3 9.5

## 2019-07-17 NOTE — Progress Notes (Signed)
CM submitted benefits check for Brilinta. Pt uses his [place] for drug coverage per [facility].

## 2019-07-17 NOTE — PMR Pre-admission (Addendum)
PMR Admission Coordinator Pre-Admission Assessment  Patient: Justin [name] is an 79 y.o., male MRN: [id number] DOB: October 04, 1940 Height: 6' (182.9 cm) Weight: 99.6 kg              Insurance Information  PRIMARY: [employer]      Policy#: [id number]      Subscriber: pt CM Name: Lattie Haw      Phone#: [phone] ext [phone]     Fax#: [phone] Pre-Cert#: [id number] approved for 7 days      Employer:  Benefits:  Phone #: [phone]     Name: 5/25 Eff. Date: 02/23/2019     Deduct: none      Out of Pocket Max: $3900      Life Max: none  CIR: $295 co pay per day days 1 until 6      SNF: no copay days 1 until 20; $183 per day days 21 until 100 Outpatient: $10 to $40 per visits     Co-Pay: visits per medical neccesity Home Health: 100%      Co-Pay: visits per medical neccesity DME: 80%     Co-Pay: 20% Providers: in network  SECONDARY: [place]: [id number]        Financial Counselor:       Phone#:   The "Data Collection Information Summary" for patients in Inpatient Rehabilitation Facilities with attached "Privacy Act [place] Records" was provided and verbally reviewed with: Patient and Family  Emergency Contact Information Contact Information    Name Relation Home Work Mobile   [place] [phone]  [phone]     Current Medical History  Patient Admitting Diagnosis: CVA  History of Present Illness: 79 y.o. 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 Presented 07/16/2019 with right side weakness and numbness. CT/MRI showed acute sub centimeter infarct of the left corona radiata centrum  semi ovale. Expected evolution of recent infarct. Most recent echocardiogram with ejection fraction of [id number] grade 1 diastolic dysfunction. Patient currently remains on aspirin and Plavix for CVA prophylaxis. Subcutaneous Lovenox for DVT prophylaxis.   Complete NIHSS TOTAL: 3 Glasgow Coma Scale Score: 15  Past Medical History  Past Medical History:  Diagnosis Date  . Brain bleed ([place])   . Cancer [facility])    prostate  . 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 Depression   . Diabetes mellitus without complication ([place])   . High cholesterol   . Hypertension   . Stroke [facility])     Family History  family history includes CAD in his mother; Diabetes in his brother, brother, mother, sister, and sister; Emphysema in his father.  Prior Rehab/Hospitalizations:  Has the patient had prior rehab or hospitalizations prior to admission? Yes Discharged form [facility] 5/21 after 15 days of inpt rehab  Has the patient had major surgery during 100 days prior to admission? No  Current Medications   Current Facility-Administered Medications:  .  acetaminophen (TYLENOL) tablet 650 mg, 650 mg, Oral, Q6H PRN **OR** acetaminophen (TYLENOL) suppository 650 mg, 650 mg, Rectal, Q6H PRN, Christian, Rylee, MD .  amLODipine (NORVASC) tablet 2.5 mg, 2.5 mg, Oral, Daily, Christian, Rylee, MD .  aspirin EC tablet 81 mg, 81 mg, Oral, Daily,  [name], MD, 81 mg at 07/18/19 [id number] .  atorvastatin (LIPITOR) tablet 80 mg, 80 mg, Oral, QPM, Christian, Rylee, MD, 80 mg at 07/17/19 1707 .  clopidogrel (PLAVIX) tablet 75 mg, 75 mg, Oral, Daily, Christian, Rylee, MD, 75 mg at 07/18/19 0927 .  enoxaparin (LOVENOX) injection 40 mg, 40 mg, Subcutaneous, Q24H, Christian, Rylee, MD, 40 mg at 07/17/19 2113 .  fenofibrate tablet 160 mg, 160 mg, Oral, Daily, Christian, Rylee, MD, 160 mg at 07/18/19 [id number] .  furosemide (LASIX) tablet 20 mg, 20 mg, Oral, Daily, Christian, Rylee, MD .  insulin aspart (novoLOG) injection 0-15  Units, 0-15 Units, Subcutaneous, TID WC, Christian, Rylee, MD, 3 Units at 07/17/19 1707 .  metFORMIN (GLUCOPHAGE) tablet 500 mg, 500 mg, Oral, BID WC, Christian, Rylee, MD .  senna (SENOKOT) tablet 8.6 mg, 1 tablet, Oral, BID, Christian, Rylee, MD, 8.6 mg at 07/18/19 0927 .  sertraline (ZOLOFT) tablet 25 mg, 25 mg, Oral, Daily, Christian, Rylee, MD, 25 mg at 07/18/19 [id number]  Patients Current Diet:  Diet Order            Diet 2 gram sodium Room service appropriate? Yes; Fluid consistency: Thin  Diet effective now              Precautions / Restrictions Precautions Precautions: Fall Precaution Comments: Rt lateral lean and h/o falls Restrictions Weight Bearing Restrictions: No   Has the patient had 2 or more falls or a fall with injury in the past year?Yes  Prior Activity Level Supervision to min guard assist via RW since d/c form CIR 07/13/2019. Not driving. Wife assisted with adls and with medication Management  Prior Functional Level Prior Function Level of Independence: Needs assistance Gait / Transfers Assistance Needed: Does his own ADLs. Uses RW for ambulation; no falls since discharge from CIR last week. ADL's / Homemaking Assistance Needed: Wife assists with IADLs and medication management.  Self Care: Did the patient need help bathing, dressing, using the toilet or eating?  Needed some help  Indoor Mobility: Did the patient need assistance with walking from room to room (with or without device)? Independent  Stairs: Did the patient need assistance with internal or external stairs (with or without device)? Independent  Functional Cognition: Did the patient need help planning regular tasks such as shopping or remembering to take medications? Needed some help  Home Assistive Devices / [place] Devices/Equipment: Cane (specify quad or straight), Walker (specify type), Wheelchair Home Equipment: Hand held shower head, Shower seat, Cane - quad, Environmental consultant - 2  wheels  Prior Device Use: Indicate devices/aids used by the patient prior to current illness, exacerbation or injury? Walker  Current Functional Level Cognition  Overall Cognitive Status: Impaired/Different from baseline Current Attention Level: Sustained Orientation Level: Oriented X4 Following Commands: Follows one step commands with increased time Safety/Judgement: Decreased awareness of safety, Decreased awareness of deficits General Comments: Pt found sitting in stool; pt initally reports June, but to correct to May with cueing; requires min verbal cueing to attend to task and noted increased cueing for sequencing and slow processing    Extremity Assessment (includes Sensation/Coordination)  Upper Extremity Assessment: Defer to OT evaluation RUE Deficits / Details: MMT WFL, sensation WFL, dysmetric R UE FMC/GMC  RUE Sensation: WNL RUE Coordination: decreased fine motor, decreased gross motor  Lower Extremity Assessment: RLE deficits/detail RLE Deficits / Details: Grossly ~4/5 throughout RLE Sensation: WNL    ADLs  Overall ADL's : Needs assistance/impaired Grooming: Minimal assistance, Sitting Upper Body Bathing:  Supervision/ safety, Set up, Sitting Lower Body Bathing: Maximal assistance, Sit to/from stand, +2 for physical assistance, +2 for safety/equipment Upper Body Dressing : Sitting, Minimal assistance Lower Body Dressing: Total assistance, +2 for physical assistance, +2 for safety/equipment, Sitting/lateral leans, Sit to/from stand Toilet Transfer: Moderate assistance, +2 for safety/equipment, RW, Stand-pivot, +2 for physical assistance Toilet Transfer Details (indicate cue type and reason): simulated to recliner  Toileting- Clothing Manipulation and Hygiene: Total assistance, Bed level Toileting - Clothing Manipulation Details (indicate cue type and reason): incontient of BM in bed Functional mobility during ADLs: Minimal assistance, Moderate assistance, +2 for physical  assistance, +2 for safety/equipment General ADL Comments: pt limited by R sided weakness and impaired coordination, decreased safety awareness, impaired balance     Mobility  Overal bed mobility: Needs Assistance Bed Mobility: Sidelying to Sit, Rolling Rolling: Min guard Sidelying to sit: Mod assist, +2 for physical assistance, +2 for safety/equipment, HOB elevated General bed mobility comments: assist for trunk support to sitting EOB towards L side     Transfers  Overall transfer level: Needs assistance Equipment used: Rolling walker (2 wheeled) Transfers: Sit to/from Stand, Stand Pivot Transfers Sit to Stand: Min assist, +2 physical assistance, +2 safety/equipment Stand pivot transfers: Mod assist, +2 physical assistance General transfer comment: initally min assist +2 safety, fading with fatigue to min +2 physical assist. Stood multiple times for peri-hygiene as pt found sitting in stool. Took a few pivotal steps to chair but difficulty advancing RLE despite assist with weight shifting and RW management; right lateral lean.    Ambulation / Gait / Stairs / Office manager / Balance Dynamic Sitting Balance Sitting balance - Comments: preference to L UE support on bed rail,  R lateral lean without UE support Balance Overall balance assessment: Needs assistance Sitting-balance support: No upper extremity supported, Single extremity supported, Feet supported Sitting balance-Leahy Scale: Fair Sitting balance - Comments: preference to L UE support on bed rail,  R lateral lean without UE support Postural control: Right lateral lean Standing balance support: Bilateral upper extremity supported, During functional activity Standing balance-Leahy Scale: Poor Standing balance comment: relies on UB and external support, Rt lateral lean, MOd A at times but able to self correct    Special needs/care consideration  Hgb A1c 7.2 CPAP Designated visitor is wife, Courtney [place] pay for  Brilinta 90 mg bid for 30 day supply with [employer] is $45.00. [employer] Mail order for Brilinta 90 mg BID for 90 day supply is $125. Tier 3. Patient also has [facility]  Living Arrangements: Spouse/significant other  Lives With: Spouse Available Help at Discharge: Family, Available 24 hours/day Type of Home: House Home Layout: One level Home Access: Stairs to enter Entrance Stairs-Rails: Right Entrance Stairs-Number of Steps: 3 Bathroom Shower/Tub: Multimedia programmer: Handicapped height Bathroom Accessibility: Yes How Accessible: Accessible via wheelchair [place]: Yes Type of [place]: Home PT, [place] (if known): [facility] [place] after CIR Additional Comments: wife can provide 24/7 supervision  Discharge Living Setting Plans for Discharge Living Setting: Patient's home, Lives with (comment) Type of Home at Discharge: House Discharge Home Layout: One level Discharge Home Access: Stairs to enter Entrance Stairs-Rails: Right Entrance Stairs-Number of Steps: 3 Discharge Bathroom Shower/Tub: Walk-in shower Discharge Bathroom Toilet: Handicapped height Discharge Bathroom Accessibility: Yes How Accessible: Accessible via walker Does the patient have any problems obtaining your medications?: No  Social/Family/Support Systems Patient Roles: Spouse Contact Information: wife, [name] Anticipated Caregiver: [name] Anticipated Caregiver's Contact Information: see above Ability/Limitations of Caregiver: supervision level Caregiver Availability: 24/7 Discharge Plan Discussed with Primary Caregiver: Yes Is Caregiver In Agreement with Plan?: Yes Does Caregiver/Family have Issues with Lodging/Transportation while Pt is in Rehab?: No  Goals Patient/Family Goal for Rehab: Min A to supervision with PT and OT and SLP Expected length of stay: ELOS 10-14 days Pt/Family Agrees to Admission and willing to  participate: Yes Program Orientation Provided & Reviewed with Pt/Caregiver Including Roles  & Responsibilities: Yes  Decrease burden of Care through IP rehab admission: n/a  Possible need for SNF placement upon discharge: not anticipated  Patient Condition: This patient's condition remains as documented in the consult dated 07/17/2019, in which the Rehabilitation Physician determined and documented that the patient's condition is appropriate for intensive rehabilitative care in an inpatient rehabilitation facility. Will admit to inpatient rehab today.  Preadmission Screen Completed By:  Cleatrice Burke, RN, 07/18/2019 11:44 AM ______________________________________________________________________   Discussed status with Dr. Posey Pronto on 07/18/2019 at  1146 and received approval for admission today.  Admission Coordinator:  Cleatrice Burke, time [id number] Date 07/18/2019

## 2019-07-18 ENCOUNTER — Encounter (HOSPITAL_COMMUNITY): Payer: Self-pay | Admitting: Physical Medicine & Rehabilitation

## 2019-07-18 ENCOUNTER — Other Ambulatory Visit: Payer: Self-pay

## 2019-07-18 ENCOUNTER — Inpatient Hospital Stay (HOSPITAL_COMMUNITY)
Admission: RE | Admit: 2019-07-18 | Discharge: 2019-08-04 | DRG: 057 | Disposition: A | Discharge status: Home Health | Payer: Medicare HMO | Source: Intra-hospital | Attending: Physical Medicine & Rehabilitation | Admitting: Physical Medicine & Rehabilitation

## 2019-07-18 DIAGNOSIS — Z7984 Long term (current) use of oral hypoglycemic drugs: Secondary | ICD-10-CM | POA: Diagnosis not present

## 2019-07-18 DIAGNOSIS — E785 Hyperlipidemia, unspecified: Secondary | ICD-10-CM | POA: Diagnosis present

## 2019-07-18 DIAGNOSIS — Z8249 Family history of ischemic heart disease and other diseases of the circulatory system: Secondary | ICD-10-CM

## 2019-07-18 DIAGNOSIS — E1151 Type 2 diabetes mellitus with diabetic peripheral angiopathy without gangrene: Secondary | ICD-10-CM | POA: Diagnosis present

## 2019-07-18 DIAGNOSIS — E1165 Type 2 diabetes mellitus with hyperglycemia: Secondary | ICD-10-CM

## 2019-07-18 DIAGNOSIS — R7309 Other abnormal glucose: Secondary | ICD-10-CM

## 2019-07-18 DIAGNOSIS — F329 Major depressive disorder, single episode, unspecified: Secondary | ICD-10-CM | POA: Diagnosis present

## 2019-07-18 DIAGNOSIS — I63512 Cerebral infarction due to unspecified occlusion or stenosis of left middle cerebral artery: Secondary | ICD-10-CM | POA: Diagnosis present

## 2019-07-18 DIAGNOSIS — Z79899 Other long term (current) drug therapy: Secondary | ICD-10-CM

## 2019-07-18 DIAGNOSIS — Z833 Family history of diabetes mellitus: Secondary | ICD-10-CM | POA: Diagnosis not present

## 2019-07-18 DIAGNOSIS — E669 Obesity, unspecified: Secondary | ICD-10-CM | POA: Diagnosis not present

## 2019-07-18 DIAGNOSIS — I69351 Hemiplegia and hemiparesis following cerebral infarction affecting right dominant side: Principal | ICD-10-CM

## 2019-07-18 DIAGNOSIS — G4733 Obstructive sleep apnea (adult) (pediatric): Secondary | ICD-10-CM | POA: Diagnosis present

## 2019-07-18 DIAGNOSIS — E7800 Pure hypercholesterolemia, unspecified: Secondary | ICD-10-CM | POA: Diagnosis present

## 2019-07-18 DIAGNOSIS — Z825 Family history of asthma and other chronic lower respiratory diseases: Secondary | ICD-10-CM

## 2019-07-18 DIAGNOSIS — R29810 Facial weakness: Secondary | ICD-10-CM | POA: Diagnosis present

## 2019-07-18 DIAGNOSIS — Z7982 Long term (current) use of aspirin: Secondary | ICD-10-CM

## 2019-07-18 DIAGNOSIS — Z7902 Long term (current) use of antithrombotics/antiplatelets: Secondary | ICD-10-CM

## 2019-07-18 DIAGNOSIS — I10 Essential (primary) hypertension: Secondary | ICD-10-CM

## 2019-07-18 DIAGNOSIS — E1169 Type 2 diabetes mellitus with other specified complication: Secondary | ICD-10-CM | POA: Diagnosis not present

## 2019-07-18 LAB — GLUCOSE, CAPILLARY
Glucose-Capillary: 85 mg/dL (ref 70–99)
Glucose-Capillary: 114 mg/dL — ABNORMAL HIGH (ref 70–99)
Glucose-Capillary: 187 mg/dL — ABNORMAL HIGH (ref 70–99)
Glucose-Capillary: 148 mg/dL — ABNORMAL HIGH (ref 70–99)

## 2019-07-18 MED ORDER — CLOPIDOGREL BISULFATE 75 MG PO TABS
75.0000 mg | ORAL_TABLET | Freq: Every day | ORAL | Status: DC
  Administered 2019-07-19 – 2019-08-04 (×17): 75 mg via ORAL
  Filled 2019-07-18 (×17): qty 1

## 2019-07-18 MED ORDER — METFORMIN HCL 500 MG PO TABS: 500.0000 mg | ORAL_TABLET | Freq: Two times a day (BID) | ORAL | Status: DC

## 2019-07-18 MED ORDER — ENOXAPARIN SODIUM 40 MG/0.4ML ~~LOC~~ SOLN: 40.0000 mg | SUBCUTANEOUS | Status: DC

## 2019-07-18 MED ORDER — FUROSEMIDE 20 MG PO TABS
20.0000 mg | ORAL_TABLET | Freq: Every day | ORAL | Status: DC
  Administered 2019-07-18: 20 mg via ORAL
  Filled 2019-07-18: qty 1

## 2019-07-18 MED ORDER — ACETAMINOPHEN 325 MG PO TABS
650.0000 mg | ORAL_TABLET | Freq: Four times a day (QID) | ORAL | Status: DC | PRN
  Filled 2019-07-18: qty 2

## 2019-07-18 MED ORDER — ASPIRIN 325 MG PO TABS
325.0000 mg | ORAL_TABLET | Freq: Every day | ORAL | Status: DC
  Administered 2019-07-19 – 2019-08-04 (×17): 325 mg via ORAL
  Filled 2019-07-18 (×17): qty 1

## 2019-07-18 MED ORDER — SORBITOL 70 % SOLN: 30.0000 mL | Freq: Every day | Status: DC | PRN

## 2019-07-18 MED ORDER — INSULIN ASPART 100 UNIT/ML ~~LOC~~ SOLN
0.0000 [IU] | Freq: Three times a day (TID) | SUBCUTANEOUS | Status: DC
  Administered 2019-07-18: 3 [IU] via SUBCUTANEOUS
  Administered 2019-07-19 – 2019-07-21 (×3): 2 [IU] via SUBCUTANEOUS
  Administered 2019-07-22: 5 [IU] via SUBCUTANEOUS
  Administered 2019-07-22: 2 [IU] via SUBCUTANEOUS
  Administered 2019-07-23: 3 [IU] via SUBCUTANEOUS
  Administered 2019-07-23: 2 [IU] via SUBCUTANEOUS
  Administered 2019-07-24: 3 [IU] via SUBCUTANEOUS
  Administered 2019-07-25 – 2019-07-26 (×3): 2 [IU] via SUBCUTANEOUS
  Administered 2019-07-27: 3 [IU] via SUBCUTANEOUS
  Administered 2019-07-28 – 2019-08-03 (×11): 2 [IU] via SUBCUTANEOUS

## 2019-07-18 MED ORDER — SENNA 8.6 MG PO TABS
1.0000 | ORAL_TABLET | Freq: Two times a day (BID) | ORAL | Status: DC
  Administered 2019-07-18 – 2019-08-04 (×34): 8.6 mg via ORAL
  Filled 2019-07-18 (×34): qty 1

## 2019-07-18 MED ORDER — ATORVASTATIN CALCIUM 80 MG PO TABS
80.0000 mg | ORAL_TABLET | Freq: Every evening | ORAL | Status: DC
  Administered 2019-07-18 – 2019-08-03 (×17): 80 mg via ORAL
  Filled 2019-07-18 (×17): qty 1

## 2019-07-18 MED ORDER — FENOFIBRATE 160 MG PO TABS
160.0000 mg | ORAL_TABLET | Freq: Every day | ORAL | Status: DC
  Administered 2019-07-19 – 2019-08-04 (×17): 160 mg via ORAL
  Filled 2019-07-18 (×17): qty 1

## 2019-07-18 MED ORDER — ACETAMINOPHEN 650 MG RE SUPP: 650.0000 mg | Freq: Four times a day (QID) | RECTAL | Status: DC | PRN

## 2019-07-18 MED ORDER — SERTRALINE HCL 50 MG PO TABS
25.0000 mg | ORAL_TABLET | Freq: Every day | ORAL | Status: DC
  Administered 2019-07-19 – 2019-08-04 (×17): 25 mg via ORAL
  Filled 2019-07-18 (×17): qty 1

## 2019-07-18 MED ORDER — ENOXAPARIN SODIUM 40 MG/0.4ML ~~LOC~~ SOLN
40.0000 mg | SUBCUTANEOUS | Status: DC
  Administered 2019-07-18 – 2019-08-03 (×17): 40 mg via SUBCUTANEOUS
  Filled 2019-07-18 (×17): qty 0.4

## 2019-07-18 MED ORDER — AMLODIPINE BESYLATE 2.5 MG PO TABS
2.5000 mg | ORAL_TABLET | Freq: Every day | ORAL | Status: DC
  Administered 2019-07-18: 2.5 mg via ORAL
  Filled 2019-07-18: qty 1

## 2019-07-18 NOTE — H&P (Signed)
Physical Medicine and Rehabilitation Admission H&P    Chief Complaint  Patient presents with  . 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  History taken from chart review and patient.  Patient lives with spouse 1 level home 3 steps to entry.  Presented on 07/16/2019 with right hemiparesis and sensory loss.  CT/MRI showed acute subcentimeter infarct of the left corona radiata centrum semiovale.  Expected evolution of recent infarct.  Most recent echocardiogram with ejection fraction of 70% with grade 1 diastolic dysfunction.  CT angiogram 06/24/2019 showed no intracranial arterial occlusion or high-grade stenosis.  Neurology follow-up he remains on aspirin and Plavix for CVA prophylaxis.  Subcutaneous Lovenox for DVT prophylaxis.  Tolerating a regular diet.  Therapy evaluations completed and patient was admitted for a comprehensive rehab program.  Please see preadmission assessment from earlier today as well.  Review of Systems  Constitutional: Negative for chills and fever.  Eyes: Negative for blurred vision and double vision.  Respiratory: Negative for cough and shortness of breath.   Cardiovascular: Negative for chest pain, palpitations and leg swelling.  Gastrointestinal: Positive for constipation. Negative for heartburn and vomiting.  Genitourinary: Positive for urgency. Negative for dysuria, flank pain and hematuria.  Musculoskeletal:  Positive for joint pain and myalgias.  Skin: Negative for rash.  Neurological: Positive for sensory change, focal weakness and weakness.  Psychiatric/Behavioral: Positive for depression. The patient has insomnia.   All other systems reviewed and are negative.  Past Medical History:  Diagnosis Date  . Brain bleed ([place])   . Cancer [facility])    prostate  . 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 Depression   . Diabetes mellitus without complication ([place])   . High cholesterol   . Hypertension   . Stroke [facility])    Past Surgical History:  Procedure Laterality Date  . arm & hand surgery Left 1972   skill saw accident  . COLON SURGERY     partial  . EYE SURGERY     lens implant and cateract- Left  . PROSTATECTOMY     Family History  Problem Relation Age of Onset  . CAD Mother   . Diabetes Mother   . Emphysema Father   . Diabetes Sister   . Diabetes Brother   . Diabetes Sister   . Diabetes Brother   . Diabetes Mellitus II Neg Hx    Social History:  reports that he has never smoked. He has never used smokeless tobacco. He reports that he does not drink alcohol or use drugs. Allergies: No Known Allergies Medications Prior to Admission  Medication Sig Dispense Refill  . amLODipine (NORVASC) 5 MG tablet Take 0.5 tablets (2.5 mg total) by mouth daily. (Patient taking differently: Take 5 mg by mouth daily. ) 90 tablet 0  . aspirin EC 81 MG EC tablet Take 1 tablet (81 mg total) by mouth daily. 90 tablet 0  . atorvastatin (LIPITOR) 80 MG tablet TAKE 1 TABLET EVERY DAY (Patient taking differently: Take 80  mg by mouth daily. ) 90 tablet 0  . Cholecalciferol (VITAMIN D-1000 MAX ST) 1000 units tablet Take 1,000 Units by mouth daily.    . clopidogrel (PLAVIX) 75 MG tablet Take 1 tablet (75 mg total) by mouth daily. 90 tablet 4  . cyanocobalamin ([employer] VITAMIN B12) 1000 MCG tablet Take 1,000 mcg by mouth daily.     Marland Kitchen docusate sodium (COLACE) 100 MG capsule Take 1 capsule (100 mg total) by  mouth 3 (three) times daily. 90 capsule 0  . fenofibrate 160 MG tablet Take 160 mg by mouth daily.    . furosemide (LASIX) 20 MG tablet Take 20 mg by mouth daily.     Marland Kitchen glipiZIDE (GLUCOTROL) 5 MG tablet Take 0.5 tablets (2.5 mg total) by mouth daily before breakfast. (Patient taking differently: Take 5 mg by mouth every evening. ) 30 tablet 0  . hydrocerin (EUCERIN) CREA Apply 1 application topically 2 (two) times daily. 454 g 0  . hydrochlorothiazide (HYDRODIURIL) 25 MG tablet Take 1 tablet (25 mg total) by mouth daily.    Marland Kitchen losartan (COZAAR) 100 MG tablet Take 100 mg by mouth daily.    . metFORMIN (GLUCOPHAGE) 500 MG tablet Take 1 tablet (500 mg total) by mouth 2 (two) times daily with a meal.    . Omega-3 Fatty Acids (FISH OIL) 1000 MG CAPS Take 1,000 mg by mouth daily with breakfast.    . polyvinyl alcohol (LIQUIFILM TEARS) 1.4 % ophthalmic solution Place 1 drop into both eyes 4 (four) times daily as needed for dry eyes.    . Semaglutide (OZEMPIC) 0.25 or 0.5 MG/DOSE SOPN Inject 0.25 mg into the skin once a week.    . senna-docusate (SENOKOT-S) 8.6-50 MG tablet Take 2 tablets by mouth at bedtime.    . sertraline (ZOLOFT) 25 MG tablet Take 1 tablet (25 mg total) by mouth daily. 90 tablet 1    Drug Regimen Review Drug regimen was reviewed and remains appropriate with no significant issues identified  Home: Home Living Family/patient expects to be discharged to:: Private residence Living Arrangements: Spouse/significant other Available Help at Discharge: Family, Available 24 hours/day Type of Home: House Home Access: Stairs to enter [employer] of Steps: 3 Entrance Stairs-Rails: Right Home Layout: One level Bathroom Shower/Tub: Multimedia programmer: Handicapped height Bathroom Accessibility: Yes Home Equipment: Engineer, manufacturing held shower head, Shower seat, Radio producer - quad, Environmental consultant - 2 wheels Additional Comments: wife can provide 24/7 supervision  Lives With: Spouse     Functional History: Prior Function Level of Independence: Needs assistance Gait / Transfers Assistance Needed: Does his own ADLs. Uses RW for ambulation; no falls since discharge from CIR last week. ADL's / Homemaking Assistance Needed: Wife assists with IADLs and medication management.  Functional Status:  Mobility: Bed Mobility Overal bed mobility: Needs Assistance Bed Mobility: Sidelying to Sit, Rolling Rolling: Min guard Sidelying to sit: Mod assist, +2 for physical assistance, +2 for safety/equipment, HOB elevated General bed mobility comments: assist for trunk support to sitting EOB towards L side  Transfers Overall transfer level: Needs assistance Equipment used: Rolling walker (2 wheeled) Transfers: Sit to/from Stand, Stand Pivot Transfers Sit to Stand: Min assist, +2 physical assistance, +2 safety/equipment Stand pivot transfers: Mod assist, +2 physical assistance General transfer comment: initally min assist +2 safety, fading with fatigue to min +2 physical assist. Stood multiple times for peri-hygiene as pt found sitting in stool. Took a few pivotal steps to chair but difficulty advancing RLE despite assist with weight shifting  and RW management; right lateral lean.      ADL: ADL Overall ADL's : Needs assistance/impaired Grooming: Minimal assistance, Sitting Upper Body Bathing: Supervision/ safety, Set up, Sitting Lower Body Bathing: Maximal assistance, Sit to/from stand, +2 for physical assistance, +2 for safety/equipment Upper Body Dressing : Sitting, Minimal assistance Lower Body Dressing: Total assistance, +2 for physical assistance, +2 for safety/equipment, Sitting/lateral leans, Sit to/from stand Toilet Transfer: Moderate assistance, +2 for safety/equipment, RW, Stand-pivot, +2 for physical assistance Toilet Transfer Details (indicate cue type and reason): simulated to recliner  Toileting- Clothing Manipulation and Hygiene: Total assistance, Bed level Toileting -  Clothing Manipulation Details (indicate cue type and reason): incontient of BM in bed Functional mobility during ADLs: Minimal assistance, Moderate assistance, +2 for physical assistance, +2 for safety/equipment General ADL Comments: pt limited by R sided weakness and impaired coordination, decreased safety awareness, impaired balance   Cognition: Cognition Overall Cognitive Status: Impaired/Different from baseline Orientation Level: Oriented X4 Cognition Arousal/Alertness: Awake/alert Behavior During Therapy: WFL for tasks assessed/performed Overall Cognitive Status: Impaired/Different from baseline Area of Impairment: Safety/judgement, Awareness, Following commands, Attention, Problem solving, Orientation Orientation Level: Time("June") Current Attention Level: Sustained Following Commands: Follows one step commands with increased time Safety/Judgement: Decreased awareness of safety, Decreased awareness of deficits Awareness: Emergent Problem Solving: Slow processing, Requires verbal cues, Difficulty sequencing General Comments: Pt found sitting in stool; pt initally reports June, but to correct to May with cueing; requires min verbal cueing to attend to task and noted increased cueing for sequencing and slow processing  Physical Exam: Blood pressure (!) 141/66, pulse (!) 57, temperature 97.8 F (36.6 C), temperature source Oral, resp. rate 16, height 6' (1.829 m), weight 99.6 kg, SpO2 99 %. Physical Exam  Vitals reviewed. Constitutional: He appears well-developed and well-nourished.  HENT:  Head: Normocephalic and atraumatic.  Eyes: EOM are normal. Right eye exhibits no discharge. Left eye exhibits no discharge.  Neck: No tracheal deviation present. No thyromegaly present.  Respiratory: Effort normal. No stridor. No respiratory distress.  GI: Soft. He exhibits no distension.  Musculoskeletal:     Comments: No edema or tenderness in extremities  Neurological: He is alert.  Mild  right facial droop Makes good eye contact with examiner.   Follows simple commands.   Provides his name age and appropriate year. Motor: Right upper extremity: 4 -/5 proximal distal Left upper extremity: 5/5 proximal distal Right lower extremity: Flexion, knee extension 2+/5, ankle dorsiflexion 4/5 Left lower extremity: 4+/5  Skin: Skin is warm and dry.  Psychiatric: He has a normal mood and affect. His behavior is normal.    Results for orders placed or performed during the hospital encounter of 07/16/19 (from the past 48 hour(s))  CBG monitoring, ED     Status: Abnormal   Collection Time: 07/16/19 12:49 PM  Result Value Ref Range   Glucose-Capillary 118 (H) 70 - 99 mg/dL    Comment: Glucose reference range applies only to samples taken after fasting for at least 8 hours.  CBG monitoring, ED     Status: Abnormal   Collection Time: 07/16/19 12:49 PM  Result Value Ref Range   Glucose-Capillary 118 (H) 70 - 99 mg/dL    Comment: Glucose reference range applies only to samples taken after fasting for at least 8 hours.  Ethanol     Status: None   Collection Time: 07/16/19  2:04 PM  Result Value Ref Range   Alcohol, Ethyl (B) <10 <10 mg/dL    Comment: (NOTE) Lowest detectable  limit for serum alcohol is 10 mg/dL. For medical purposes only. Performed at [facility], [place] [address], [place], [place] [address]   CBC with Differential/Platelet     Status: Abnormal   Collection Time: 07/16/19  2:04 PM  Result Value Ref Range   WBC 11.0 (H) 4.0 - 10.5 K/uL   RBC 5.39 4.22 - 5.81 MIL/uL   Hemoglobin 15.0 13.0 - 17.0 g/dL   HCT 45.6 39.0 - 52.0 %   MCV 84.6 80.0 - 100.0 fL   MCH 27.8 26.0 - 34.0 pg   MCHC 32.9 30.0 - 36.0 g/dL   RDW 12.6 11.5 - 15.5 %   Platelets 267 150 - 400 K/uL   nRBC 0.0 0.0 - 0.2 %   Neutrophils Relative % 73 %   Neutro Abs 7.8 (H) 1.7 - 7.7 K/uL   Lymphocytes Relative 19 %   Lymphs Abs 2.1 0.7 - 4.0 K/uL   Monocytes Relative 7 %   Monocytes Absolute  0.8 0.1 - 1.0 K/uL   Eosinophils Relative 1 %   Eosinophils Absolute 0.2 0.0 - 0.5 K/uL   Basophils Relative 0 %   Basophils Absolute 0.0 0.0 - 0.1 K/uL   Immature Granulocytes 0 %   Abs Immature Granulocytes 0.04 0.00 - 0.07 K/uL    Comment: Performed at [facility] [address] [place], [place] [address]  Comprehensive metabolic panel     Status: Abnormal   Collection Time: 07/16/19  2:04 PM  Result Value Ref Range   Sodium 140 135 - 145 mmol/L   Potassium 4.1 3.5 - 5.1 mmol/L   Chloride 103 98 - 111 mmol/L   CO2 26 22 - 32 mmol/L   Glucose, Bld 95 70 - 99 mg/dL    Comment: Glucose reference range applies only to samples taken after fasting for at least 8 hours.   BUN 20 8 - 23 mg/dL   Creatinine, Ser 0.86 0.61 - 1.24 mg/dL   Calcium 9.3 8.9 - 10.3 mg/dL   Total Protein 6.3 (L) 6.5 - 8.1 g/dL   Albumin 3.5 3.5 - 5.0 g/dL   AST 19 15 - 41 U/L   ALT 22 0 - 44 U/L   Alkaline Phosphatase 36 (L) 38 - 126 U/L   Total Bilirubin 0.8 0.3 - 1.2 mg/dL   GFR calc non Af Amer >60 >60 mL/min   GFR calc Af Amer >60 >60 mL/min   Anion gap 11 5 - 15    Comment: Performed at [facility], [place] [address], [place], [place] [address]  Protime-INR     Status: None   Collection Time: 07/16/19  2:04 PM  Result Value Ref Range   Prothrombin Time 13.5 11.4 - 15.2 seconds   INR 1.1 0.8 - 1.2    Comment: (NOTE) INR goal varies based on device and disease states. Performed at [facility], [place] [address] [place], [place] [address]   APTT     Status: Abnormal   Collection Time: 07/16/19  2:04 PM  Result Value Ref Range   aPTT 21 (L) 24 - 36 seconds    Comment: Performed at [facility], [place] [address] [place],  [address]  I-stat chem 8, ED     Status: Abnormal   Collection Time: 07/16/19  2:16 PM  Result Value Ref Range   Sodium 138 135 - 145 mmol/L   Potassium 4.1 3.5 - 5.1 mmol/L   Chloride  101 98 - 111 mmol/L   BUN 26 (H) 8 - 23 mg/dL   Creatinine, Ser  0.90 0.61 - 1.24 mg/dL   Glucose, Bld 92 70 - 99 mg/dL    Comment: Glucose reference range applies only to samples taken after fasting for at least 8 hours.   Calcium, Ion 1.12 (L) 1.15 - 1.40 mmol/L   TCO2 30 22 - 32 mmol/L   Hemoglobin 14.6 13.0 - 17.0 g/dL   HCT 43.0 39.0 - 52.0 %  SARS Coronavirus 2 by RT PCR (hospital order, performed in [facility] hospital lab) Nasopharyngeal Nasopharyngeal Swab     Status: None   Collection Time: 07/16/19  6:58 PM   Specimen: Nasopharyngeal Swab  Result Value Ref Range   SARS Coronavirus 2 NEGATIVE NEGATIVE    Comment: (NOTE) SARS-CoV-2 target nucleic acids are NOT DETECTED. The SARS-CoV-2 RNA is generally detectable in upper and lower respiratory specimens during the acute phase of infection. The lowest concentration of SARS-CoV-2 viral copies this assay can detect is 250 copies / mL. A negative result does not preclude SARS-CoV-2 infection and should not be used as the sole basis for treatment or other patient management decisions.  A negative result may occur with improper specimen collection / handling, submission of specimen other than nasopharyngeal swab, presence of viral mutation(s) within the areas targeted by this assay, and inadequate number of viral copies (<250 copies / mL). A negative result must be combined with clinical observations, patient history, and epidemiological information. Fact Sheet for Patients:   StrictlyIdeas.no Fact Sheet for Healthcare Providers: BankingDealers.co.za This test is not yet approved or cleared  by the [place] FDA and has been authorized for detection and/or diagnosis of SARS-CoV-2 by FDA under an Emergency Use Authorization (EUA).  This EUA will remain in effect (meaning this test can be used) for the duration of the COVID-19 declaration under Section 564(b)(1) of the Act, 21 U.S.C. section 360bbb-3(b)(1), unless the authorization is terminated  or revoked sooner. Performed at [facility], [place] [address], [place], [place] [address]   Glucose, capillary     Status: Abnormal   Collection Time: 07/16/19  9:17 PM  Result Value Ref Range   Glucose-Capillary 68 (L) 70 - 99 mg/dL    Comment: Glucose reference range applies only to samples taken after fasting for at least 8 hours.  Glucose, capillary     Status: Abnormal   Collection Time: 07/16/19  9:46 PM  Result Value Ref Range   Glucose-Capillary 63 (L) 70 - 99 mg/dL    Comment: Glucose reference range applies only to samples taken after fasting for at least 8 hours.  Glucose, capillary     Status: None   Collection Time: 07/16/19 10:07 PM  Result Value Ref Range   Glucose-Capillary 91 70 - 99 mg/dL    Comment: Glucose reference range applies only to samples taken after fasting for at least 8 hours.  CBC     Status: None   Collection Time: 07/17/19  6:23 AM  Result Value Ref Range   WBC 7.5 4.0 - 10.5 K/uL   RBC 5.02 4.22 - 5.81 MIL/uL   Hemoglobin 13.8 13.0 - 17.0 g/dL   HCT 41.9 39.0 - 52.0 %   MCV 83.5 80.0 - 100.0 fL   MCH 27.5 26.0 - 34.0 pg   MCHC 32.9 30.0 - 36.0 g/dL   RDW 12.6 11.5 - 15.5 %   Platelets 241 150 - 400 K/uL  nRBC 0.0 0.0 - 0.2 %    Comment: Performed at [facility], [place] [address], [place], [place] [id number]  Basic metabolic panel     Status: Abnormal   Collection Time: 07/17/19  6:23 AM  Result Value Ref Range   Sodium 139 135 - 145 mmol/L   Potassium 3.5 3.5 - 5.1 mmol/L   Chloride 104 98 - 111 mmol/L   CO2 27 22 - 32 mmol/L   Glucose, Bld 98 70 - 99 mg/dL    Comment: Glucose reference range applies only to samples taken after fasting for at least 8 hours.   BUN 14 8 - 23 mg/dL   Creatinine, Ser 0.72 0.61 - 1.24 mg/dL   Calcium 8.6 (L) 8.9 - 10.3 mg/dL   GFR calc non Af Amer >60 >60 mL/min   GFR calc Af Amer >60 >60 mL/min   Anion gap 8 5 - 15    Comment: Performed at [facility] [address], [place], [place]  [address]  Glucose, capillary     Status: None   Collection Time: 07/17/19  6:24 AM  Result Value Ref Range   Glucose-Capillary 93 70 - 99 mg/dL    Comment: Glucose reference range applies only to samples taken after fasting for at least 8 hours.   Comment 1 Notify RN    Comment 2 Document in Chart   Glucose, capillary     Status: None   Collection Time: 07/17/19  8:42 AM  Result Value Ref Range   Glucose-Capillary 94 70 - 99 mg/dL    Comment: Glucose reference range applies only to samples taken after fasting for at least 8 hours.  Glucose, capillary     Status: Abnormal   Collection Time: 07/17/19 11:44 AM  Result Value Ref Range   Glucose-Capillary 179 (H) 70 - 99 mg/dL    Comment: Glucose reference range applies only to samples taken after fasting for at least 8 hours.  Glucose, capillary     Status: Abnormal   Collection Time: 07/17/19  3:32 PM  Result Value Ref Range   Glucose-Capillary 153 (H) 70 - 99 mg/dL    Comment: Glucose reference range applies only to samples taken after fasting for at least 8 hours.  Glucose, capillary     Status: Abnormal   Collection Time: 07/17/19  7:32 PM  Result Value Ref Range   Glucose-Capillary 146 (H) 70 - 99 mg/dL    Comment: Glucose reference range applies only to samples taken after fasting for at least 8 hours.   Comment 1 Notify RN    Comment 2 Document in Chart   Urine rapid drug screen (hosp performed)     Status: None   Collection Time: 07/17/19  8:22 PM  Result Value Ref Range   Opiates NONE DETECTED NONE DETECTED   Cocaine NONE DETECTED NONE DETECTED   Benzodiazepines NONE DETECTED NONE DETECTED   Amphetamines NONE DETECTED NONE DETECTED   Tetrahydrocannabinol NONE DETECTED NONE DETECTED   Barbiturates NONE DETECTED NONE DETECTED    Comment: (NOTE) DRUG SCREEN FOR MEDICAL PURPOSES ONLY.  IF CONFIRMATION IS NEEDED FOR ANY PURPOSE, NOTIFY LAB WITHIN 5 DAYS. LOWEST DETECTABLE LIMITS FOR URINE DRUG SCREEN Drug Class                      Cutoff (ng/mL) Amphetamine and metabolites    1000 Barbiturate and metabolites    200 Benzodiazepine  [id number] Tricyclics and metabolites     300 Opiates and metabolites        300 Cocaine and metabolites        300 THC                            50 Performed at [facility], [place] [address], [place], [place] [address]   Urinalysis, Routine w reflex microscopic     Status: Abnormal   Collection Time: 07/17/19  8:22 PM  Result Value Ref Range   Color, Urine STRAW (A) YELLOW   APPearance CLEAR CLEAR   Specific Gravity, Urine 1.003 (L) 1.005 - 1.030   pH 7.0 5.0 - 8.0   Glucose, UA 50 (A) NEGATIVE mg/dL   Hgb urine dipstick NEGATIVE NEGATIVE   Bilirubin Urine NEGATIVE NEGATIVE   Ketones, ur NEGATIVE NEGATIVE mg/dL   Protein, ur NEGATIVE NEGATIVE mg/dL   Nitrite NEGATIVE NEGATIVE   Leukocytes,Ua NEGATIVE NEGATIVE    Comment: Performed at [facility] [address], [place], [place] [address]  Glucose, capillary     Status: Abnormal   Collection Time: 07/17/19  9:16 PM  Result Value Ref Range   Glucose-Capillary 126 (H) 70 - 99 mg/dL    Comment: Glucose reference range applies only to samples taken after fasting for at least 8 hours.   CT HEAD WO CONTRAST  Result Date: 07/16/2019 CLINICAL DATA:  New right-sided deficit, recent stroke, last known well 2:30 p.m. yesterday, right leg weakness since last evening with new inability to ambulate today EXAM: CT HEAD WITHOUT CONTRAST TECHNIQUE: Contiguous axial images were obtained from the base of the skull through the vertex without intravenous contrast. COMPARISON:  06/24/2019 FINDINGS: Brain: Diffuse hypodensities are seen throughout the periventricular white matter, consistent with chronic small vessel ischemic change. Chronic ischemic changes bilateral basal ganglia, left greater than right. No new infarct or hemorrhage. Lateral ventricles and midline structures are stable. No acute extra-axial fluid  collections. No mass effect. Vascular: No hyperdense vessel or unexpected calcification. Skull: Normal. Negative for fracture or focal lesion. Sinuses/Orbits: No acute finding. Other: None. IMPRESSION: 1. Stable chronic small-vessel ischemic changes throughout the periventricular white matter and bilateral basal ganglia. 2. No evidence of acute infarct or hemorrhage. Electronically Signed   By: Randa Ngo M.D.   On: 07/16/2019 16:17   MR BRAIN WO CONTRAST  Result Date: 07/16/2019 CLINICAL DATA:  Decreased sensation and use of right arm EXAM: MRI HEAD WITHOUT CONTRAST TECHNIQUE: Multiplanar, multiecho pulse sequences of the brain and surrounding structures were obtained without intravenous contrast. COMPARISON:  06/24/2019 FINDINGS: Brain: There is a new subcentimeter focus of restricted diffusion in the left corona radiata/centrum semiovale. Expected evolution of right corona radiata/centrum semiovale infarcts. Confluent areas of T2 hyperintensity in the supratentorial white matter are nonspecific but probably reflect advanced chronic microvascular ischemic changes. There are chronic bilateral infarcts of the corona radiata. Prominence of ventricles and sulci reflects stable generalized parenchymal volume loss superimposed on ex vacuo dilatation related to above. Stable foci of susceptibility reflecting chronic blood products. There is no intracranial mass or mass effect. Vascular: Major vessel flow voids at the skull base are preserved. Skull and upper cervical spine: Normal marrow signal is preserved. Sinuses/Orbits: Minor mucosal thickening.  Left lens replacement. Other: Sella is unremarkable.  Mastoid air cells are clear. IMPRESSION: Acute subcentimeter infarct of the left corona radiata/centrum semiovale. Expected evolution of recent infarcts seen on the  prior study. Stable additional chronic findings detailed above. Electronically Signed   By: Macy Mis M.D.   On: 07/16/2019 16:43        Medical Problem List and Plan: 1.  Right hemiparesis and sensory changes secondary to recurrent left MCA infarct in setting of recent MCA/ACA infarcts.  Present infarct acute left corona radiata/centrum semiovale likely secondary small vessel disease  -patient may shower  -ELOS/Goals: 12-15 days/supervision/min a  Admit to CIR 2.  Antithrombotics: -DVT/anticoagulation: Lovenox  -antiplatelet therapy: Aspirin 325 mg daily and Plavix 75 mg daily x3 months then Plavix alone 3. Pain Management: Tylenol as needed 4. Mood: Zoloft 25 mg daily  -antipsychotic agents: N/A 5. Neuropsych: This patient is capable of making decisions on his own behalf. 6. Skin/Wound Care: Routine skin checks 7. Fluids/Electrolytes/Nutrition: Routine in and outs.  CMP ordered for tomorrow. 8.  Permissive hypertension.  Monitor with increased mobility.  Patient on Norvasc 5 mg daily, Lasix 20 mg daily, HCTZ 25 mg daily, Cozaar 100 mg daily prior to admission.  Resume as needed  Monitor with increased mobility 10.  Diabetes mellitus with hyperglycemia.  Latest hemoglobin A1c 7.2.  SSI.  Patient on Glucotrol 2.5 mg and Glucophage 500 mg twice daily and Ozempic 0.25 mg weekly prior to admission.  Resume as needed  Monitor the increased mobility 11.  Hyperlipidemia.  Lipitor/fenofibrate 12.  OSA.  Continue CPAP  Lavon Paganini Angiulli, PA-C 07/18/2019  I have personally performed a face to face diagnostic evaluation, including, but not limited to relevant history and physical exam findings, of this patient and developed relevant assessment and plan.  Additionally, I have reviewed and concur with the physician assistant's documentation above.  Delice Lesch, MD, ABPMR  The patient's status has not changed. The original post admission physician evaluation remains appropriate, and any changes from the pre-admission screening or documentation from the acute chart are noted above.   Delice Lesch, MD, ABPMR

## 2019-07-18 NOTE — Progress Notes (Signed)
Inpatient Rehabilitation  Patient information reviewed and entered into eRehab system by Laiyla Slagel M. Pernell Dikes, M.A., CCC/SLP, PPS Coordinator.  Information including medical coding, functional ability and quality indicators will be reviewed and updated through discharge.    

## 2019-07-18 NOTE — Progress Notes (Signed)
NAME:  Justin [name], MRN:  [id number], DOB:  01/19/1941, LOS: 2 ADMISSION DATE:  07/16/2019   Brief History  79 yo male significant history of strokes (left basal ganglia in 08/2017, left corona radiata in 09/2018, and right MCA 06/2019), HTN, HLD, OSA. Presented to [facility] 5/24 for 1d history of RLE weakness.  Recently discharged from [place] on Friday prior to admission, following his most recent CVA that occurred the beginning of May. Complete stroke workup was performed at that time which showed severe vascular disease. He has been on aspirin and plavix since that time.  Subjective  No overnight events.   Significant Hospital Events   5/24 admission. MRI showing acute infarct 5/25 therapy recommending CIR>CIR accepting pending insurance approval 5/26 awaiting insurance auth for CIR  Objective   Blood pressure (!) 141/66, pulse (!) 57, temperature 97.8 F (36.6 C), temperature source Oral, resp. rate 16, height 6' (1.829 m), weight 99.6 kg, SpO2 99 %.     Intake/Output Summary (Last 24 hours) at 07/18/2019 0519 Last data filed at 07/18/2019 0513 Gross per 24 hour  Intake 200 ml  Output 1320 ml  Net -1120 ml   Examination: GENERAL: in no acute distress CARDIAC: heart RRR.  PULMONARY: breathing comfortable on room air.  NEURO: a/o.   Consults:  neurology  Significant Diagnostic Tests:  5/24 MRI brain: acute subcentimeter infarct of the left corona radiata/centrum semiovale. Normal evolution of prior infarcts observed.  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 Repeat stroke workup deferred due to it having just been done the beginning of May. Therapy recommending CIR who is accepting. Medically stable for discharge to CIR pending insurance authorization.  Assessment & Plan:  Active Problems:   CVA (cerebral vascular accident) ([place])  Acute  CVA with infarct involving left corona radiata with residual RLE weakness. Neurology on board Plan --continue plavix and aspirin. Would ideally be transitioned to brillinta however CM looking into financial aspect. --May benefit from outpatient holter monitor.  --d/c to CIR pending insurance auth  HTN/HLD. Continue to hold home antihypertensives to allow for permissive HTN.  Blood pressures are actually normotensive at this time. Resuming home dose amlodipine and lasix today. Continue statin and fenofibrate.  T2DM. Blood sugars trending up. SSI. Will resume home dose metformin today.  OSA. Continue CPAP HS.  Best practice:  CODE STATUS: DNR Diet: heart healthy DVT for prophylaxis: lovenox Social considerations/Family communication: wife Dispo: CIR pending insurance Ladona Ridgel, MD [place] PGY-1 PAGER #: [phone] 07/18/19  5:19 AM  Labs    CBC Latest Ref Rng & Units 07/17/2019 07/16/2019 07/16/2019  WBC 4.0 - 10.5 K/uL 7.5 - 11.0(H)  Hemoglobin 13.0 - 17.0 g/dL 13.8 14.6 15.0  Hematocrit 39.0 - 52.0 % 41.9 43.0 45.6  Platelets 150 - 400 K/uL 241 - 267   BMP Latest Ref Rng & Units 07/17/2019 07/16/2019 07/16/2019  Glucose 70 - 99 mg/dL 98 92 95  BUN 8 - 23 mg/dL 14 26(H) 20  Creatinine 0.61 - 1.24 mg/dL 0.72 0.90 0.86  Sodium 135 - 145 mmol/L 139 138 140  Potassium 3.5 - 5.1 mmol/L 3.5 4.1 4.1  Chloride 98 - 111 mmol/L 104 101 103  CO2 22 - 32 mmol/L 27 - 26  Calcium 8.9 - 10.3 mg/dL 8.6(L) - 9.3

## 2019-07-18 NOTE — Progress Notes (Signed)
STROKE TEAM PROGRESS NOTE   INTERVAL HISTORY He is up in the chair at the bedside.  He has been accepted to go to inpatient rehab today when bed is available.  Neurologically unchanged.  Vital signs stable.  No complaints.  Vitals:   07/17/19 2313 07/18/19 0510 07/18/19 0935 07/18/19 1156  BP: (!) 142/64 (!) 141/66 (!) 136/55 (!) 119/57  Pulse: (!) 57 (!) 57 61 61  Resp: 18 16 16 16   Temp: 97.9 F (36.6 C) 97.8 F (36.6 C) 98.2 F (36.8 C) 97.6 F (36.4 C)  TempSrc: Oral Oral Axillary Oral  SpO2: 100% 99% 100% 100%  Weight:  99.6 kg    Height:        CBC:  Recent Labs  Lab 07/16/19 1404 07/16/19 1404 07/16/19 1416 07/17/19 0623  WBC 11.0*  --   --  7.5  NEUTROABS 7.8*  --   --   --   HGB 15.0   < > 14.6 13.8  HCT 45.6   < > 43.0 41.9  MCV 84.6  --   --  83.5  PLT 267  --   --  241   < > = values in this interval not displayed.    Basic Metabolic Panel:  Recent Labs  Lab 07/16/19 1404 07/16/19 1404 07/16/19 1416 07/17/19 0623  NA 140   < > 138 139  K 4.1   < > 4.1 3.5  CL 103   < > 101 104  CO2 26  --   --  27  GLUCOSE 95   < > 92 98  BUN 20   < > 26* 14  CREATININE 0.86   < > 0.90 0.72  CALCIUM 9.3  --   --  8.6*   < > = values in this interval not displayed.   Lipid Panel:     Component Value Date/Time   CHOL 103 06/25/2019 0415   TRIG 136 06/25/2019 0415   HDL 28 (L) 06/25/2019 0415   CHOLHDL 3.7 06/25/2019 0415   VLDL 27 06/25/2019 0415   LDLCALC 48 06/25/2019 0415   HgbA1c:  Lab Results  Component Value Date   HGBA1C 7.2 (H) 06/25/2019   Urine Drug Screen:     Component Value Date/Time   LABOPIA NONE DETECTED 07/17/2019 2022   COCAINSCRNUR NONE DETECTED 07/17/2019 2022   LABBENZ NONE DETECTED 07/17/2019 2022   AMPHETMU NONE DETECTED 07/17/2019 2022   THCU NONE DETECTED 07/17/2019 2022   LABBARB NONE DETECTED 07/17/2019 2022    Alcohol Level     Component Value Date/Time   ETH <10 07/16/2019 1404    IMAGING past 24 hours No  results found.  PHYSICAL EXAM Pleasant elderly Caucasian male not in distress. . Afebrile. Head is nontraumatic. Neck is supple without bruit.    Cardiac exam no murmur or gallop. Lungs are clear to auscultation. Distal pulses are well felt. Neurological Exam : He is awake alert oriented to time place and person.  Speech is slightly hypophonic and slow and nonfluent but l without dysarthria or aphasia.  Slightly diminished attention, registration and recall.  Follows 1 and two-step commands.  Extraocular movements are full range without nystagmus.  Face is symmetric.  Tongue midline.  Motor system exam shows mild right hand grip weakness as well as right leg proximal hip flexion and ankle dorsiflexor weakness.  Mild tremor of outstretched upper extremity left greater than right.  Sensation preserved bilaterally deep tendon reflexes are 2+ symmetric.  Plantars  downgoing.  Gait not tested. ASSESSMENT/PLAN Justin [name] is a 79 y.o. male with history of HTN, HLD, DB and multiple prior strokes presenting with right-sided numbness, tingling and weakness .   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  Present infarct likely secondary to small vessel disease though he has known known large vessel disease and cryptogenic etiology for his prior strokes  CT head No acute abnormality. Small vessel disease.   MRI  Acute L corona radiata / centrum semiovale infarct. Expected evolution of previous infarcts.  Lovenox 40 mg sq daily for VTE prophylaxis  aspirin 325 mg daily and clopidogrel 75 mg daily prior to admission for 3 months starting 06/24/2019 given intracranial atherosclerosis, now on aspirin 325 mg daily and clopidogrel 75 mg daily. Continue for 3 months and end with plavix.   Therapy recommendations:  CIR  Disposition:  pending   Hx stroke/TIA  06/24/2019 - 2 small R semiovale and MCA/ACA border zone small infarcts likely secondary to small vessel disease source.  DAPT x 3 mos -> plavix. 30d monitor    09/2018 -admitted for confusion and slurry speech, MRI showed leftcorona radiatainfarct likely due tosmall vessel disease source in setting of intracranial atherosclerosis (L M2 high-grade stenosis).  MRA head and neck showed right P1, bilateral A2, left M2 stenosis.  EF 60 to 65%.  LDL 48 and A1c 6.9.  Put on DAPT x 3 mos then plavix alone.  Also Lipitor 80.  08/2017 admitted for right arm weakness. MRI showed left BG infarct. MRA showed left P1 high-grade stenosis with robust Pcom. Carotid Doppler negative. EF 60 to 65%. LDL 115 and A1c 7.7. Patient discharged with aspirin 325 and Plavix 75 and Lipitor 80.  Hypertension  Stable . Permissive hypertension (OK if < 220/120) but gradually normalize in 5-7 days . Long-term BP goal normotensive  Hyperlipidemia  Home meds:  lipitor 80 and fish oil, Lipitor resumed in hospital  LDL 48, goal < 70  Continue statin and fish oil at discharge  Diabetes type II Uncontrolled  HgbA1c 7.2, goal < 7.0  Other Stroke Risk Factors  Advanced age  Obesity, Body mass index is 29.78 kg/m., recommend weight loss, diet and exercise as appropriate   Hx stroke/TIA - see above  obstructive sleep apnea on CPAP at hs  Other Active Problems    Hospital day # 2  Patient has had multiple recurrent strokes over the last 2 to 3 years both suggestive of small vessel etiology as well as cryptogenic etiology and has known intracranial atherosclerosis as well.  Recommend continue aspirin and Plavix as he is unlikely to afford the switch to aspirin and Brilinta which may be more expensive.  Continue aggressive risk factor modification.  Patient counseled to be compliant with using his CPAP every night for his sleep apnea.  No need for loop recorder since most of his strokes have been clearly from small vessel disease related to his intracranial atherosclerosis.  Discharge to inpatient rehab today.  Follow-up with an  outpatient stroke clinic in 6 weeks.  Discussed with Dr. Heber [place] from internal medicine teaching service.  Antony Contras, MD To contact Stroke Continuity provider, please refer to [email or web address] After hours, contact General Neurology

## 2019-07-18 NOTE — Plan of Care (Signed)

## 2019-07-18 NOTE — TOC Transition Note (Signed)
Transition of Care [facility]) - CM/SW Discharge Note   Patient Details  Name: Justin [name] MRN: [id number] Date of Birth: [birth year]  Transition of Care [facility]) CM/SW Contact:  Pollie Friar, RN Phone Number: 07/18/2019, 11:43 AM   Clinical Narrative:    Pt discharging to CIR today. CM signing off.    Final next level of care: IP Rehab Facility Barriers to Discharge: No Barriers Identified   Patient Goals and CMS Choice        Discharge Placement                       Discharge Plan and Services                                     Social Determinants of Health (SDOH) Interventions     Readmission Risk Interventions No flowsheet data found.

## 2019-07-18 NOTE — Progress Notes (Signed)
Physical Therapy Treatment Patient Details Name: Justin [name] MRN: [id number] DOB: Jun 24, 1940 Today's Date: 07/18/2019    History of Present Illness Patient is a 79 y/o male who presents with right sided weakness/numbness. Brain MRI- acute infarct in left corona radiata/centrum semiovale. Recent admissions for CVA 10/03/18 (left MCA CVA) and 06/24/19 (right MCA CVA). PMH includes multiple CVAs, HTN, hypercholesterolemia, DM, depression, ICH.    PT Comments    Patient is making progress toward PT goals and able to ambulate 2-3 ft X 2 trials. Pt continues to present with R lateral bias and requires assistance for functional transfers and gait training. Continue to recommend CIR for further skilled PT services to maximize independence and safety with mobility.    Follow Up Recommendations  CIR     Equipment Recommendations  Other (comment)(defer to post acute venue)    Recommendations for Other Services Rehab consult     Precautions / Restrictions Precautions Precautions: Fall Precaution Comments: Rt lateral lean and h/o falls Restrictions Weight Bearing Restrictions: No    Mobility  Bed Mobility               General bed mobility comments: pt OOB in chair upon arrival   Transfers Overall transfer level: Needs assistance Equipment used: Rolling walker (2 wheeled) Transfers: Sit to/from [facility] Sit to Stand: Mod assist;Min assist         General transfer comment: pt stood X 3 trials from recliner; min A to power up into standing and mod A for balance due to R lateral bias   Ambulation/Gait Ambulation/Gait assistance: +2 safety/equipment;Mod assist;Min assist(chair follow ) Gait Distance (Feet): (2-3 ft X 2 trials) Assistive device: Rolling walker (2 wheeled) Gait Pattern/deviations: Step-through pattern;Decreased step length - right;Decreased weight shift to left;Decreased stride length;Trunk flexed Gait velocity: decreased   General Gait  Details: assist for balance, weight shifting, and stabilizing RW as pt has R lateral bias    Stairs             Wheelchair Mobility    Modified Rankin (Stroke Patients Only) Modified Rankin (Stroke Patients Only) Pre-Morbid Rankin Score: No symptoms Modified Rankin: Moderately severe disability     Balance Overall balance assessment: Needs assistance Sitting-balance support: No upper extremity supported;Feet supported Sitting balance-Leahy Scale: Fair     Standing balance support: Bilateral upper extremity supported;During functional activity Standing balance-Leahy Scale: Poor                              Cognition Arousal/Alertness: Awake/alert Behavior During Therapy: WFL for tasks assessed/performed Overall Cognitive Status: Within Functional Limits for tasks assessed                                 General Comments: pt familiar to this therapist from recent previous admission and appears to be at new baseline cognition      Exercises      General Comments        Pertinent Vitals/Pain Pain Assessment: No/denies pain    Home Living                      Prior Function            PT Goals (current goals can now be found in the care plan section) Progress towards PT goals: Progressing toward goals    Frequency  Min 4X/week      PT Plan Current plan remains appropriate    Co-evaluation              AM-PAC PT "6 Clicks" Mobility   Outcome Measure  Help needed turning from your back to your side while in a flat bed without using bedrails?: A Little Help needed moving from lying on your back to sitting on the side of a flat bed without using bedrails?: A Little Help needed moving to and from a bed to a chair (including a wheelchair)?: A Lot Help needed standing up from a chair using your arms (e.g., wheelchair or bedside chair)?: A Little Help needed to walk in hospital room?: A Lot Help needed  climbing 3-5 steps with a railing? : Total 6 Click Score: 14    End of Session Equipment Utilized During Treatment: Gait belt Activity Tolerance: Patient tolerated treatment well Patient left: in chair;with call bell/phone within reach;with chair alarm set Nurse Communication: Mobility status PT Visit Diagnosis: Unsteadiness on feet (R26.81);Hemiplegia and hemiparesis;Difficulty in walking, not elsewhere classified (R26.2) Hemiplegia - Right/Left: Right Hemiplegia - dominant/non-dominant: Dominant Hemiplegia - caused by: Cerebral infarction     Time: [id number] PT Time Calculation (min) (ACUTE ONLY): 13 min  Charges:  $Gait Training: 8-22 mins                     Earney Navy, PTA Acute Rehabilitation Services Pager: [phone] Office: [phone]     Darliss Cheney 07/18/2019, 2:15 PM

## 2019-07-18 NOTE — Progress Notes (Signed)
Inpatient Rehabilitation Admissions Coordinator  CIR bed is approved and bed is available to admit to today.I met with patient at bedside and he is in agreement.  I have alerted primary team , TOC team, Kelli and acute team. I will make the arrangements to admit today.  Danne Baxter, RN, MSN Rehab Admissions Coordinator [phone] 07/18/2019 11:41 AM

## 2019-07-18 NOTE — Progress Notes (Signed)
Justin Arn, MD  Physician  Physical Medicine and Rehabilitation  PMR Pre-admission      Addendum  Date of Service:  07/17/2019  2:10 PM      Related encounter: ED to Hosp-Admission (Discharged) from 07/16/2019 in [facility]        Show:Clear all [x] Manual[x] Template[x] Copied  Added by: [x] Garland Smouse, Vertis Kelch, RN[x] Justin Arn, MD  [] Hover for details PMR Admission Coordinator Pre-Admission Assessment   Patient: Justin [name] is an 79 y.o., male MRN: [id number] DOB: [birth year] Height: 6' (182.9 cm) Weight: 99.6 kg                                                                                                                                                  Insurance Information   PRIMARY: [employer]      Policy#: [id number]      Subscriber: pt CM Name: Lattie Haw      Phone#: [phone] ext [phone]     Fax#: [phone] Pre-Cert#: [id number] approved for 7 days      Employer:  Benefits:  Phone #: [phone]     Name: 5/25 Eff. Date: 02/23/2019     Deduct: none      Out of Pocket Max: $3900      Life Max: none  CIR: $295 co pay per day days 1 until 6      SNF: no copay days 1 until 20; $183 per day days 21 until 100 Outpatient: $10 to $40 per visits     Co-Pay: visits per medical neccesity Home Health: 100%      Co-Pay: visits per medical neccesity DME: 80%     Co-Pay: 20% Providers: in network  SECONDARY: [place]: [id number]         Financial Counselor:       Phone#:    The "Data Collection Information Summary" for patients in Inpatient Rehabilitation Facilities with attached "Privacy Act [place] Records" was provided and verbally reviewed with: Patient and Family   Emergency Contact Information Contact Information     Name Relation Home Work Mobile    [place] [phone]   [phone]       Current Medical History  Patient Admitting Diagnosis: CVA   History of Present Illness: 79 y.o.  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 Presented 07/16/2019 with right side weakness and numbness. CT/MRI showed acute sub centimeter infarct of the left corona radiata centrum semi ovale. Expected evolution of recent infarct. Most recent echocardiogram with ejection fraction of [id number] grade 1 diastolic dysfunction.  Patient currently remains on aspirin and Plavix for CVA prophylaxis. Subcutaneous Lovenox for DVT prophylaxis.    Complete NIHSS TOTAL: 3 Glasgow Coma Scale Score: 15   Past Medical History      Past Medical History:  Diagnosis Date  . Brain bleed ([place])    . Cancer [facility])      prostate  . CHI (closed head injury) 04/2013    with SAH  . Depression    . Diabetes mellitus without complication ([place])    . High cholesterol    . Hypertension    . Stroke [facility])        Family History  family history includes CAD in his mother; Diabetes in his brother, brother, mother, sister, and sister; Emphysema in his father.   Prior Rehab/Hospitalizations:  Has the patient had prior rehab or hospitalizations prior to admission? Yes Discharged form [facility] 5/21 after 15 days of inpt rehab   Has the patient had major surgery during 100 days prior to admission? No   Current Medications    Current Facility-Administered Medications:  .  acetaminophen (TYLENOL) tablet 650 mg, 650 mg, Oral, Q6H PRN **OR** acetaminophen (TYLENOL) suppository 650 mg, 650 mg, Rectal, Q6H PRN, Christian, Rylee, MD .  amLODipine (NORVASC) tablet 2.5 mg, 2.5 mg, Oral, Daily, Christian, Rylee, MD .  aspirin EC tablet 81 mg, 81 mg, Oral, Daily, Christian, Rylee,  MD, 81 mg at 07/18/19 0927 .  atorvastatin (LIPITOR) tablet 80 mg, 80 mg, Oral, QPM, Christian, Rylee, MD, 80 mg at 07/17/19 1707 .  clopidogrel (PLAVIX) tablet 75 mg, 75 mg, Oral, Daily, Christian, Rylee, MD, 75 mg at 07/18/19 0927 .  enoxaparin (LOVENOX) injection 40 mg, 40 mg, Subcutaneous, Q24H, Christian, Rylee, MD, 40 mg at 07/17/19 2113 .  fenofibrate tablet 160 mg, 160 mg, Oral, Daily, Christian, Rylee, MD, 160 mg at 07/18/19 [id number] .  furosemide (LASIX) tablet 20 mg, 20 mg, Oral, Daily, Christian, Rylee, MD .  insulin aspart (novoLOG) injection 0-15 Units, 0-15 Units, Subcutaneous, TID WC, Christian, Rylee, MD, 3 Units at 07/17/19 1707 .  metFORMIN (GLUCOPHAGE) tablet 500 mg, 500 mg, Oral, BID WC, Christian, Rylee, MD .  senna (SENOKOT) tablet 8.6 mg, 1 tablet, Oral, BID, Christian, Rylee, MD, 8.6 mg at 07/18/19 0927 .  sertraline (ZOLOFT) tablet 25 mg, 25 mg, Oral, Daily, Christian, Rylee, MD, 25 mg at 07/18/19 [id number]   Patients Current Diet:     Diet Order                      Diet 2 gram sodium Room service appropriate? Yes; Fluid consistency: Thin  Diet effective now                   Precautions / Restrictions Precautions Precautions: Fall Precaution Comments: Rt lateral lean and h/o falls Restrictions Weight Bearing Restrictions: No    Has the patient had 2 or more falls or a fall with injury in the past year?Yes   Prior Activity Level Supervision to min guard assist via RW since d/c form CIR 07/13/2019. Not driving. Wife assisted with adls and with medication Management   Prior Functional Level Prior Function Level of Independence: Needs assistance Gait / Transfers Assistance Needed: Does his own ADLs. Uses RW for ambulation; no falls since discharge from CIR last week. ADL's / Homemaking Assistance Needed: Wife assists with IADLs and medication management.   Self Care: Did the patient need help bathing, dressing, using the toilet or eating?  Needed some help     Indoor Mobility: Did the patient need assistance with walking from room to room (with or without device)? Independent   Stairs: Did the patient need assistance with internal or external stairs (with or without device)? Independent   Functional Cognition: Did the patient need help planning regular tasks such as shopping or remembering to take medications? Needed some help   Home Assistive Devices / [place] Devices/Equipment: Cane (specify quad or straight), Walker (specify type), Wheelchair Home Equipment: Hand held shower head, Shower seat, Cane - quad, Environmental consultant - 2 wheels   Prior Device Use: Indicate devices/aids used by the patient prior to current illness, exacerbation or injury? Walker   Current Functional Level Cognition   Overall Cognitive Status: Impaired/Different from baseline Current Attention Level: Sustained Orientation Level: Oriented X4 Following Commands: Follows one step commands with increased time Safety/Judgement: Decreased awareness of safety, Decreased awareness of deficits General Comments: Pt found sitting in stool; pt initally reports June, but to correct to May with cueing; requires min verbal cueing to attend to task and noted increased cueing for sequencing and slow processing    Extremity Assessment (includes Sensation/Coordination)   Upper Extremity Assessment: Defer to OT evaluation RUE Deficits / Details: MMT WFL, sensation WFL, dysmetric R UE FMC/GMC  RUE Sensation: WNL RUE Coordination: decreased fine motor, decreased gross motor  Lower Extremity Assessment: RLE deficits/detail RLE Deficits / Details: Grossly ~4/5 throughout RLE Sensation: WNL     ADLs   Overall ADL's : Needs assistance/impaired Grooming: Minimal assistance, Sitting Upper Body Bathing: Supervision/ safety, Set up, Sitting Lower Body Bathing: Maximal assistance, Sit to/from stand, +2 for physical assistance, +2 for safety/equipment Upper Body Dressing : Sitting,  Minimal assistance Lower Body Dressing: Total assistance, +2 for physical assistance, +2 for safety/equipment, Sitting/lateral leans, Sit to/from stand Toilet Transfer: Moderate assistance, +2 for safety/equipment, RW, Stand-pivot, +2 for physical assistance Toilet Transfer Details (indicate cue type and reason): simulated to recliner  Toileting- Clothing Manipulation and Hygiene: Total assistance, Bed level Toileting - Clothing Manipulation Details (indicate cue type and reason): incontient of BM in bed Functional mobility during ADLs: Minimal assistance, Moderate assistance, +2 for physical assistance, +2 for safety/equipment General ADL Comments: pt limited by R sided weakness and impaired coordination, decreased safety awareness, impaired balance      Mobility   Overal bed mobility: Needs Assistance Bed Mobility: Sidelying to Sit, Rolling Rolling: Min guard Sidelying to sit: Mod assist, +2 for physical assistance, +2 for safety/equipment, HOB elevated General bed mobility comments: assist for trunk support to sitting EOB towards L side      Transfers   Overall transfer level: Needs assistance Equipment used: Rolling walker (2 wheeled) Transfers: Sit to/from Stand, Stand Pivot Transfers Sit to Stand: Min assist, +2 physical assistance, +2 safety/equipment Stand pivot transfers: Mod assist, +2 physical assistance General transfer comment: initally min assist +2 safety, fading with fatigue to min +2 physical assist. Stood multiple times for peri-hygiene as pt found sitting in stool. Took a few pivotal steps to chair but difficulty advancing RLE despite assist with weight shifting and RW management; right lateral lean.     Ambulation / Gait / Stairs / Proofreader / Balance Dynamic Sitting Balance Sitting balance - Comments: preference to L UE support on bed rail,  R lateral lean without UE support Balance Overall balance assessment: Needs  assistance Sitting-balance support: No upper extremity supported, Single extremity supported, Feet  supported Sitting balance-Leahy Scale: Fair Sitting balance - Comments: preference to L UE support on bed rail,  R lateral lean without UE support Postural control: Right lateral lean Standing balance support: Bilateral upper extremity supported, During functional activity Standing balance-Leahy Scale: Poor Standing balance comment: relies on UB and external support, Rt lateral lean, MOd A at times but able to self correct     Special needs/care consideration  Hgb A1c 7.2 CPAP Designated visitor is wife, Courtney [place] pay for Brilinta 90 mg bid for 30 day supply with [employer] is $45.00. [employer] Mail order for Brilinta 90 mg BID for 90 day supply is $125. Tier 3. Patient also has [place]  Living Arrangements: Spouse/significant other  Lives With: Spouse Available Help at Discharge: Family, Available 24 hours/day Type of Home: House Home Layout: One level Home Access: Stairs to enter Entrance Stairs-Rails: Right Entrance Stairs-Number of Steps: 3 Bathroom Shower/Tub: Multimedia programmer: Handicapped height Bathroom Accessibility: Yes How Accessible: Accessible via wheelchair [place]: Yes Type of [place]: Home PT, [place] (if known): Well [place] after CIR Additional Comments: wife can provide 24/7 supervision   Discharge Living Setting Plans for Discharge Living Setting: Patient's home, Lives with (comment) Type of Home at Discharge: House Discharge Home Layout: One level Discharge Home Access: Stairs to enter Entrance Stairs-Rails: Right Entrance Stairs-Number of Steps: 3 Discharge Bathroom Shower/Tub: Walk-in shower Discharge Bathroom Toilet: Handicapped height Discharge Bathroom Accessibility: Yes How Accessible: Accessible via walker Does the patient have any problems obtaining your  medications?: No   Social/Family/Support Systems Patient Roles: Spouse Contact Information: wife, [name] Anticipated Caregiver: [name] Anticipated Caregiver's Contact Information: see above Ability/Limitations of Caregiver: supervision level Caregiver Availability: 24/7 Discharge Plan Discussed with Primary Caregiver: Yes Is Caregiver In Agreement with Plan?: Yes Does Caregiver/Family have Issues with Lodging/Transportation while Pt is in Rehab?: No   Goals Patient/Family Goal for Rehab: Min A to supervision with PT and OT and SLP Expected length of stay: ELOS 10-14 days Pt/Family Agrees to Admission and willing to participate: Yes Program Orientation Provided & Reviewed with Pt/Caregiver Including Roles  & Responsibilities: Yes   Decrease burden of Care through IP rehab admission: n/a   Possible need for SNF placement upon discharge: not anticipated   Patient Condition: This patient's condition remains as documented in the consult dated 07/17/2019, in which the Rehabilitation Physician determined and documented that the patient's condition is appropriate for intensive rehabilitative care in an inpatient rehabilitation facility. Will admit to inpatient rehab today.   Preadmission Screen Completed By:  Cleatrice Burke, RN, 07/18/2019 11:44 AM ______________________________________________________________________   Discussed status with Dr. Posey Pronto on 07/18/2019 at  1146 and received approval for admission today.   Admission Coordinator:  Cleatrice Burke, time [id number] Date 07/18/2019         Revision History

## 2019-07-18 NOTE — H&P (Signed)
Physical Medicine and Rehabilitation Admission H&P    Chief Complaint  Patient presents with  . 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  History taken from chart review and patient.  Patient lives with spouse 1 level home 3 steps to entry.  Presented on 07/16/2019 with right hemiparesis and sensory loss.  CT/MRI showed acute subcentimeter infarct of the left corona radiata centrum semiovale.  Expected evolution of recent infarct.  Most recent echocardiogram with ejection fraction of 70% with grade 1 diastolic dysfunction.  CT angiogram 06/24/2019 showed no intracranial arterial occlusion or high-grade stenosis.  Neurology follow-up he remains on aspirin and Plavix for CVA prophylaxis.  Subcutaneous Lovenox for DVT prophylaxis.  Tolerating a regular diet.  Therapy evaluations completed and patient was admitted for a comprehensive rehab program.  Please see preadmission assessment from earlier today as well.  Review of Systems  Constitutional: Negative for chills and fever.  Eyes: Negative for blurred vision and double vision.  Respiratory: Negative for cough and shortness of breath.   Cardiovascular: Negative for chest pain, palpitations and leg swelling.  Gastrointestinal: Positive for constipation. Negative for heartburn and vomiting.  Genitourinary: Positive for urgency. Negative for dysuria, flank pain and hematuria.  Musculoskeletal:  Positive for joint pain and myalgias.  Skin: Negative for rash.  Neurological: Positive for sensory change, focal weakness and weakness.  Psychiatric/Behavioral: Positive for depression. The patient has insomnia.   All other systems reviewed and are negative.  Past Medical History:  Diagnosis Date  . Brain bleed ([facility])   . Cancer [facility])    prostate  . 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 Depression   . Diabetes mellitus without complication ([place])   . High cholesterol   . Hypertension   . Stroke [facility])    Past Surgical History:  Procedure Laterality Date  . arm & hand surgery Left 1972   skill saw accident  . COLON SURGERY     partial  . EYE SURGERY     lens implant and cateract- Left  . PROSTATECTOMY     Family History  Problem Relation Age of Onset  . CAD Mother   . Diabetes Mother   . Emphysema Father   . Diabetes Sister   . Diabetes Brother   . Diabetes Sister   . Diabetes Brother   . Diabetes Mellitus II Neg Hx    Social History:  reports that he has never smoked. He has never used smokeless tobacco. He reports that he does not drink alcohol or use drugs. Allergies: No Known Allergies Medications Prior to Admission  Medication Sig Dispense Refill  . amLODipine (NORVASC) 5 MG tablet Take 0.5 tablets (2.5 mg total) by mouth daily. (Patient taking differently: Take 5 mg by mouth daily. ) 90 tablet 0  . aspirin EC 81 MG EC tablet Take 1 tablet (81 mg total) by mouth daily. 90 tablet 0  . atorvastatin (LIPITOR) 80 MG tablet TAKE 1 TABLET EVERY DAY (Patient taking differently: Take 80  mg by mouth daily. ) 90 tablet 0  . Cholecalciferol (VITAMIN D-1000 MAX ST) 1000 units tablet Take 1,000 Units by mouth daily.    . clopidogrel (PLAVIX) 75 MG tablet Take 1 tablet (75 mg total) by mouth daily. 90 tablet 4  . cyanocobalamin ([employer] VITAMIN B12) 1000 MCG tablet Take 1,000 mcg by mouth daily.     Marland Kitchen docusate sodium (COLACE) 100 MG capsule Take 1 capsule (100 mg total) by  mouth 3 (three) times daily. 90 capsule 0  . fenofibrate 160 MG tablet Take 160 mg by mouth daily.    . furosemide (LASIX) 20 MG tablet Take 20 mg by mouth daily.     Marland Kitchen glipiZIDE (GLUCOTROL) 5 MG tablet Take 0.5 tablets (2.5 mg total) by mouth daily before breakfast. (Patient taking differently: Take 5 mg by mouth every evening. ) 30 tablet 0  . hydrocerin (EUCERIN) CREA Apply 1 application topically 2 (two) times daily. 454 g 0  . hydrochlorothiazide (HYDRODIURIL) 25 MG tablet Take 1 tablet (25 mg total) by mouth daily.    Marland Kitchen losartan (COZAAR) 100 MG tablet Take 100 mg by mouth daily.    . metFORMIN (GLUCOPHAGE) 500 MG tablet Take 1 tablet (500 mg total) by mouth 2 (two) times daily with a meal.    . Omega-3 Fatty Acids (FISH OIL) 1000 MG CAPS Take 1,000 mg by mouth daily with breakfast.    . polyvinyl alcohol (LIQUIFILM TEARS) 1.4 % ophthalmic solution Place 1 drop into both eyes 4 (four) times daily as needed for dry eyes.    . Semaglutide (OZEMPIC) 0.25 or 0.5 MG/DOSE SOPN Inject 0.25 mg into the skin once a week.    . senna-docusate (SENOKOT-S) 8.6-50 MG tablet Take 2 tablets by mouth at bedtime.    . sertraline (ZOLOFT) 25 MG tablet Take 1 tablet (25 mg total) by mouth daily. 90 tablet 1    Drug Regimen Review Drug regimen was reviewed and remains appropriate with no significant issues identified  Home: Home Living Family/patient expects to be discharged to:: Private residence Living Arrangements: Spouse/significant other Available Help at Discharge: Family, Available 24 hours/day Type of Home: House Home Access: Stairs to enter [employer] of Steps: 3 Entrance Stairs-Rails: Right Home Layout: One level Bathroom Shower/Tub: Multimedia programmer: Handicapped height Bathroom Accessibility: Yes Home Equipment: Engineer, manufacturing held shower head, Shower seat, Radio producer - quad, Environmental consultant - 2 wheels Additional Comments: wife can provide 24/7 supervision  Lives With: Spouse    Functional History: Prior Function Level of Independence: Needs assistance Gait / Transfers Assistance Needed: Does his own ADLs. Uses RW for ambulation; no falls since discharge from CIR last week. ADL's / Homemaking Assistance Needed: Wife assists with IADLs and medication management.  Functional Status:  Mobility: Bed Mobility Overal bed mobility: Needs Assistance Bed Mobility: Sidelying to Sit, Rolling Rolling: Min guard Sidelying to sit: Mod assist, +2 for physical assistance, +2 for safety/equipment, HOB elevated General bed mobility comments: assist for trunk support to sitting EOB towards L side  Transfers Overall transfer level: Needs assistance Equipment used: Rolling walker (2 wheeled) Transfers: Sit to/from Stand, Stand Pivot Transfers Sit to Stand: Min assist, +2 physical assistance, +2 safety/equipment Stand pivot transfers: Mod assist, +2 physical assistance General transfer comment: initally min assist +2 safety, fading with fatigue to min +2 physical assist. Stood multiple times for peri-hygiene as pt found sitting in stool. Took a few pivotal steps to chair but difficulty advancing RLE despite assist with weight shifting and  RW management; right lateral lean.      ADL: ADL Overall ADL's : Needs assistance/impaired Grooming: Minimal assistance, Sitting Upper Body Bathing: Supervision/ safety, Set up, Sitting Lower Body Bathing: Maximal assistance, Sit to/from stand, +2 for physical assistance, +2 for safety/equipment Upper Body Dressing : Sitting, Minimal assistance Lower Body Dressing: Total assistance, +2 for physical assistance, +2 for safety/equipment, Sitting/lateral leans, Sit to/from stand Toilet Transfer: Moderate assistance, +2 for safety/equipment, RW, Stand-pivot, +2 for physical assistance Toilet Transfer Details (indicate cue type and reason): simulated to recliner  Toileting- Clothing Manipulation and Hygiene: Total assistance, Bed level Toileting -  Clothing Manipulation Details (indicate cue type and reason): incontient of BM in bed Functional mobility during ADLs: Minimal assistance, Moderate assistance, +2 for physical assistance, +2 for safety/equipment General ADL Comments: pt limited by R sided weakness and impaired coordination, decreased safety awareness, impaired balance   Cognition: Cognition Overall Cognitive Status: Impaired/Different from baseline Orientation Level: Oriented X4 Cognition Arousal/Alertness: Awake/alert Behavior During Therapy: WFL for tasks assessed/performed Overall Cognitive Status: Impaired/Different from baseline Area of Impairment: Safety/judgement, Awareness, Following commands, Attention, Problem solving, Orientation Orientation Level: Time("June") Current Attention Level: Sustained Following Commands: Follows one step commands with increased time Safety/Judgement: Decreased awareness of safety, Decreased awareness of deficits Awareness: Emergent Problem Solving: Slow processing, Requires verbal cues, Difficulty sequencing General Comments: Pt found sitting in stool; pt initally reports June, but to correct to May with cueing; requires min verbal cueing to attend to task and noted increased cueing for sequencing and slow processing  Physical Exam: Blood pressure (!) 141/66, pulse (!) 57, temperature 97.8 F (36.6 C), temperature source Oral, resp. rate 16, height 6' (1.829 m), weight 99.6 kg, SpO2 99 %. Physical Exam  Vitals reviewed. Constitutional: He appears well-developed and well-nourished.  HENT:  Head: Normocephalic and atraumatic.  Eyes: EOM are normal. Right eye exhibits no discharge. Left eye exhibits no discharge.  Neck: No tracheal deviation present. No thyromegaly present.  Respiratory: Effort normal. No stridor. No respiratory distress.  GI: Soft. He exhibits no distension.  Musculoskeletal:     Comments: No edema or tenderness in extremities  Neurological: He is alert.  Mild  right facial droop Makes good eye contact with examiner.   Follows simple commands.   Provides his name age and appropriate year. Motor: Right upper extremity: 4 -/5 proximal distal Left upper extremity: 5/5 proximal distal Right lower extremity: Flexion, knee extension 2+/5, ankle dorsiflexion 4/5 Left lower extremity: 4+/5  Skin: Skin is warm and dry.  Psychiatric: He has a normal mood and affect. His behavior is normal.    Results for orders placed or performed during the hospital encounter of 07/16/19 (from the past 48 hour(s))  CBG monitoring, ED     Status: Abnormal   Collection Time: 07/16/19 12:49 PM  Result Value Ref Range   Glucose-Capillary 118 (H) 70 - 99 mg/dL    Comment: Glucose reference range applies only to samples taken after fasting for at least 8 hours.  CBG monitoring, ED     Status: Abnormal   Collection Time: 07/16/19 12:49 PM  Result Value Ref Range   Glucose-Capillary 118 (H) 70 - 99 mg/dL    Comment: Glucose reference range applies only to samples taken after fasting for at least 8 hours.  Ethanol     Status: None   Collection Time: 07/16/19  2:04 PM  Result Value Ref Range   Alcohol, Ethyl (B) <10 <10 mg/dL    Comment: (NOTE) Lowest detectable limit  for serum alcohol is 10 mg/dL. For medical purposes only. Performed at [facility], [place] [address], [place], [place] [address]   CBC with Differential/Platelet     Status: Abnormal   Collection Time: 07/16/19  2:04 PM  Result Value Ref Range   WBC 11.0 (H) 4.0 - 10.5 K/uL   RBC 5.39 4.22 - 5.81 MIL/uL   Hemoglobin 15.0 13.0 - 17.0 g/dL   HCT 45.6 39.0 - 52.0 %   MCV 84.6 80.0 - 100.0 fL   MCH 27.8 26.0 - 34.0 pg   MCHC 32.9 30.0 - 36.0 g/dL   RDW 12.6 11.5 - 15.5 %   Platelets 267 150 - 400 K/uL   nRBC 0.0 0.0 - 0.2 %   Neutrophils Relative % 73 %   Neutro Abs 7.8 (H) 1.7 - 7.7 K/uL   Lymphocytes Relative 19 %   Lymphs Abs 2.1 0.7 - 4.0 K/uL   Monocytes Relative 7 %   Monocytes Absolute  0.8 0.1 - 1.0 K/uL   Eosinophils Relative 1 %   Eosinophils Absolute 0.2 0.0 - 0.5 K/uL   Basophils Relative 0 %   Basophils Absolute 0.0 0.0 - 0.1 K/uL   Immature Granulocytes 0 %   Abs Immature Granulocytes 0.04 0.00 - 0.07 K/uL    Comment: Performed at [facility] [address], [place], [place] [address]  Comprehensive metabolic panel     Status: Abnormal   Collection Time: 07/16/19  2:04 PM  Result Value Ref Range   Sodium 140 135 - 145 mmol/L   Potassium 4.1 3.5 - 5.1 mmol/L   Chloride 103 98 - 111 mmol/L   CO2 26 22 - 32 mmol/L   Glucose, Bld 95 70 - 99 mg/dL    Comment: Glucose reference range applies only to samples taken after fasting for at least 8 hours.   BUN 20 8 - 23 mg/dL   Creatinine, Ser 0.86 0.61 - 1.24 mg/dL   Calcium 9.3 8.9 - 10.3 mg/dL   Total Protein 6.3 (L) 6.5 - 8.1 g/dL   Albumin 3.5 3.5 - 5.0 g/dL   AST 19 15 - 41 U/L   ALT 22 0 - 44 U/L   Alkaline Phosphatase 36 (L) 38 - 126 U/L   Total Bilirubin 0.8 0.3 - 1.2 mg/dL   GFR calc non Af Amer >60 >60 mL/min   GFR calc Af Amer >60 >60 mL/min   Anion gap 11 5 - 15    Comment: Performed at [facility], [place] [address], [place], [place] [address]  Protime-INR     Status: None   Collection Time: 07/16/19  2:04 PM  Result Value Ref Range   Prothrombin Time 13.5 11.4 - 15.2 seconds   INR 1.1 0.8 - 1.2    Comment: (NOTE) INR goal varies based on device and disease states. Performed at [facility], [place] [address] [place], [place] [address]   APTT     Status: Abnormal   Collection Time: 07/16/19  2:04 PM  Result Value Ref Range   aPTT 21 (L) 24 - 36 seconds    Comment: Performed at [facility], [place] [address], [place],  [address]  I-stat chem 8, ED     Status: Abnormal   Collection Time: 07/16/19  2:16 PM  Result Value Ref Range   Sodium 138 135 - 145 mmol/L   Potassium 4.1 3.5 - 5.1 mmol/L   Chloride 101  98 - 111 mmol/L   BUN 26 (H) 8 - 23 mg/dL   Creatinine, Ser  0.90 0.61 - 1.24 mg/dL   Glucose, Bld 92 70 - 99 mg/dL    Comment: Glucose reference range applies only to samples taken after fasting for at least 8 hours.   Calcium, Ion 1.12 (L) 1.15 - 1.40 mmol/L   TCO2 30 22 - 32 mmol/L   Hemoglobin 14.6 13.0 - 17.0 g/dL   HCT 43.0 39.0 - 52.0 %  SARS Coronavirus 2 by RT PCR (hospital order, performed in [facility] hospital lab) Nasopharyngeal Nasopharyngeal Swab     Status: None   Collection Time: 07/16/19  6:58 PM   Specimen: Nasopharyngeal Swab  Result Value Ref Range   SARS Coronavirus 2 NEGATIVE NEGATIVE    Comment: (NOTE) SARS-CoV-2 target nucleic acids are NOT DETECTED. The SARS-CoV-2 RNA is generally detectable in upper and lower respiratory specimens during the acute phase of infection. The lowest concentration of SARS-CoV-2 viral copies this assay can detect is 250 copies / mL. A negative result does not preclude SARS-CoV-2 infection and should not be used as the sole basis for treatment or other patient management decisions.  A negative result may occur with improper specimen collection / handling, submission of specimen other than nasopharyngeal swab, presence of viral mutation(s) within the areas targeted by this assay, and inadequate number of viral copies (<250 copies / mL). A negative result must be combined with clinical observations, patient history, and epidemiological information. Fact Sheet for Patients:   StrictlyIdeas.no Fact Sheet for Healthcare Providers: BankingDealers.co.za This test is not yet approved or cleared  by the [place] FDA and has been authorized for detection and/or diagnosis of SARS-CoV-2 by FDA under an Emergency Use Authorization (EUA).  This EUA will remain in effect (meaning this test can be used) for the duration of the COVID-19 declaration under Section 564(b)(1) of the Act, 21 U.S.C. section 360bbb-3(b)(1), unless the authorization is terminated  or revoked sooner. Performed at [facility], [place] [address] [place], [place] [address]   Glucose, capillary     Status: Abnormal   Collection Time: 07/16/19  9:17 PM  Result Value Ref Range   Glucose-Capillary 68 (L) 70 - 99 mg/dL    Comment: Glucose reference range applies only to samples taken after fasting for at least 8 hours.  Glucose, capillary     Status: Abnormal   Collection Time: 07/16/19  9:46 PM  Result Value Ref Range   Glucose-Capillary 63 (L) 70 - 99 mg/dL    Comment: Glucose reference range applies only to samples taken after fasting for at least 8 hours.  Glucose, capillary     Status: None   Collection Time: 07/16/19 10:07 PM  Result Value Ref Range   Glucose-Capillary 91 70 - 99 mg/dL    Comment: Glucose reference range applies only to samples taken after fasting for at least 8 hours.  CBC     Status: None   Collection Time: 07/17/19  6:23 AM  Result Value Ref Range   WBC 7.5 4.0 - 10.5 K/uL   RBC 5.02 4.22 - 5.81 MIL/uL   Hemoglobin 13.8 13.0 - 17.0 g/dL   HCT 41.9 39.0 - 52.0 %   MCV 83.5 80.0 - 100.0 fL   MCH 27.5 26.0 - 34.0 pg   MCHC 32.9 30.0 - 36.0 g/dL   RDW 12.6 11.5 - 15.5 %   Platelets 241 150 - 400 K/uL   nRBC  0.0 0.0 - 0.2 %    Comment: Performed at [facility], [place] [address] [place], [place] [id number]  Basic metabolic panel     Status: Abnormal   Collection Time: 07/17/19  6:23 AM  Result Value Ref Range   Sodium 139 135 - 145 mmol/L   Potassium 3.5 3.5 - 5.1 mmol/L   Chloride 104 98 - 111 mmol/L   CO2 27 22 - 32 mmol/L   Glucose, Bld 98 70 - 99 mg/dL    Comment: Glucose reference range applies only to samples taken after fasting for at least 8 hours.   BUN 14 8 - 23 mg/dL   Creatinine, Ser 0.72 0.61 - 1.24 mg/dL   Calcium 8.6 (L) 8.9 - 10.3 mg/dL   GFR calc non Af Amer >60 >60 mL/min   GFR calc Af Amer >60 >60 mL/min   Anion gap 8 5 - 15    Comment: Performed at [facility] [address], [place], [place]  [address]  Glucose, capillary     Status: None   Collection Time: 07/17/19  6:24 AM  Result Value Ref Range   Glucose-Capillary 93 70 - 99 mg/dL    Comment: Glucose reference range applies only to samples taken after fasting for at least 8 hours.   Comment 1 Notify RN    Comment 2 Document in Chart   Glucose, capillary     Status: None   Collection Time: 07/17/19  8:42 AM  Result Value Ref Range   Glucose-Capillary 94 70 - 99 mg/dL    Comment: Glucose reference range applies only to samples taken after fasting for at least 8 hours.  Glucose, capillary     Status: Abnormal   Collection Time: 07/17/19 11:44 AM  Result Value Ref Range   Glucose-Capillary 179 (H) 70 - 99 mg/dL    Comment: Glucose reference range applies only to samples taken after fasting for at least 8 hours.  Glucose, capillary     Status: Abnormal   Collection Time: 07/17/19  3:32 PM  Result Value Ref Range   Glucose-Capillary 153 (H) 70 - 99 mg/dL    Comment: Glucose reference range applies only to samples taken after fasting for at least 8 hours.  Glucose, capillary     Status: Abnormal   Collection Time: 07/17/19  7:32 PM  Result Value Ref Range   Glucose-Capillary 146 (H) 70 - 99 mg/dL    Comment: Glucose reference range applies only to samples taken after fasting for at least 8 hours.   Comment 1 Notify RN    Comment 2 Document in Chart   Urine rapid drug screen (hosp performed)     Status: None   Collection Time: 07/17/19  8:22 PM  Result Value Ref Range   Opiates NONE DETECTED NONE DETECTED   Cocaine NONE DETECTED NONE DETECTED   Benzodiazepines NONE DETECTED NONE DETECTED   Amphetamines NONE DETECTED NONE DETECTED   Tetrahydrocannabinol NONE DETECTED NONE DETECTED   Barbiturates NONE DETECTED NONE DETECTED    Comment: (NOTE) DRUG SCREEN FOR MEDICAL PURPOSES ONLY.  IF CONFIRMATION IS NEEDED FOR ANY PURPOSE, NOTIFY LAB WITHIN 5 DAYS. LOWEST DETECTABLE LIMITS FOR URINE DRUG SCREEN Drug Class                      Cutoff (ng/mL) Amphetamine and metabolites    1000 Barbiturate and metabolites    200 Benzodiazepine  [id number] Tricyclics and metabolites     300 Opiates and metabolites        300 Cocaine and metabolites        300 THC                            50 Performed at [facility], [place] [address], [place], [place] [address]   Urinalysis, Routine w reflex microscopic     Status: Abnormal   Collection Time: 07/17/19  8:22 PM  Result Value Ref Range   Color, Urine STRAW (A) YELLOW   APPearance CLEAR CLEAR   Specific Gravity, Urine 1.003 (L) 1.005 - 1.030   pH 7.0 5.0 - 8.0   Glucose, UA 50 (A) NEGATIVE mg/dL   Hgb urine dipstick NEGATIVE NEGATIVE   Bilirubin Urine NEGATIVE NEGATIVE   Ketones, ur NEGATIVE NEGATIVE mg/dL   Protein, ur NEGATIVE NEGATIVE mg/dL   Nitrite NEGATIVE NEGATIVE   Leukocytes,Ua NEGATIVE NEGATIVE    Comment: Performed at [facility] [address], [place], [place] [address]  Glucose, capillary     Status: Abnormal   Collection Time: 07/17/19  9:16 PM  Result Value Ref Range   Glucose-Capillary 126 (H) 70 - 99 mg/dL    Comment: Glucose reference range applies only to samples taken after fasting for at least 8 hours.   CT HEAD WO CONTRAST  Result Date: 07/16/2019 CLINICAL DATA:  New right-sided deficit, recent stroke, last known well 2:30 p.m. yesterday, right leg weakness since last evening with new inability to ambulate today EXAM: CT HEAD WITHOUT CONTRAST TECHNIQUE: Contiguous axial images were obtained from the base of the skull through the vertex without intravenous contrast. COMPARISON:  06/24/2019 FINDINGS: Brain: Diffuse hypodensities are seen throughout the periventricular white matter, consistent with chronic small vessel ischemic change. Chronic ischemic changes bilateral basal ganglia, left greater than right. No new infarct or hemorrhage. Lateral ventricles and midline structures are stable. No acute extra-axial fluid  collections. No mass effect. Vascular: No hyperdense vessel or unexpected calcification. Skull: Normal. Negative for fracture or focal lesion. Sinuses/Orbits: No acute finding. Other: None. IMPRESSION: 1. Stable chronic small-vessel ischemic changes throughout the periventricular white matter and bilateral basal ganglia. 2. No evidence of acute infarct or hemorrhage. Electronically Signed   By: Randa Ngo M.D.   On: 07/16/2019 16:17   MR BRAIN WO CONTRAST  Result Date: 07/16/2019 CLINICAL DATA:  Decreased sensation and use of right arm EXAM: MRI HEAD WITHOUT CONTRAST TECHNIQUE: Multiplanar, multiecho pulse sequences of the brain and surrounding structures were obtained without intravenous contrast. COMPARISON:  06/24/2019 FINDINGS: Brain: There is a new subcentimeter focus of restricted diffusion in the left corona radiata/centrum semiovale. Expected evolution of right corona radiata/centrum semiovale infarcts. Confluent areas of T2 hyperintensity in the supratentorial white matter are nonspecific but probably reflect advanced chronic microvascular ischemic changes. There are chronic bilateral infarcts of the corona radiata. Prominence of ventricles and sulci reflects stable generalized parenchymal volume loss superimposed on ex vacuo dilatation related to above. Stable foci of susceptibility reflecting chronic blood products. There is no intracranial mass or mass effect. Vascular: Major vessel flow voids at the skull base are preserved. Skull and upper cervical spine: Normal marrow signal is preserved. Sinuses/Orbits: Minor mucosal thickening.  Left lens replacement. Other: Sella is unremarkable.  Mastoid air cells are clear. IMPRESSION: Acute subcentimeter infarct of the left corona radiata/centrum semiovale. Expected evolution of recent infarcts seen on the  prior study. Stable additional chronic findings detailed above. Electronically Signed   By: Macy Mis M.D.   On: 07/16/2019 16:43        Medical Problem List and Plan: 1.  Right hemiparesis and sensory changes secondary to recurrent left MCA infarct in setting of recent MCA/ACA infarcts.  Present infarct acute left corona radiata/centrum semiovale likely secondary small vessel disease  -patient may shower  -ELOS/Goals: 12-15 days/supervision/min a  Admit to CIR 2.  Antithrombotics: -DVT/anticoagulation: Lovenox  -antiplatelet therapy: Aspirin 325 mg daily and Plavix 75 mg daily x3 months then Plavix alone 3. Pain Management: Tylenol as needed 4. Mood: Zoloft 25 mg daily  -antipsychotic agents: N/A 5. Neuropsych: This patient is capable of making decisions on his own behalf. 6. Skin/Wound Care: Routine skin checks 7. Fluids/Electrolytes/Nutrition: Routine in and outs.  CMP ordered for tomorrow. 8.  Permissive hypertension.  Monitor with increased mobility.  Patient on Norvasc 5 mg daily, Lasix 20 mg daily, HCTZ 25 mg daily, Cozaar 100 mg daily prior to admission.  Resume as needed  Monitor with increased mobility 10.  Diabetes mellitus with hyperglycemia.  Latest hemoglobin A1c 7.2.  SSI.  Patient on Glucotrol 2.5 mg and Glucophage 500 mg twice daily and Ozempic 0.25 mg weekly prior to admission.  Resume as needed  Monitor the increased mobility 11.  Hyperlipidemia.  Lipitor/fenofibrate 12.  OSA.  Continue CPAP  Lavon Paganini Angiulli, PA-C 07/18/2019  I have personally performed a face to face diagnostic evaluation, including, but not limited to relevant history and physical exam findings, of this patient and developed relevant assessment and plan.  Additionally, I have reviewed and concur with the physician assistant's documentation above.  Delice Lesch, MD, ABPMR

## 2019-07-18 NOTE — Progress Notes (Signed)
Courtney Heys, MD  Physician  Physical Medicine and Rehabilitation  Consult Note      Signed  Date of Service:  07/17/2019 11:39 AM      Related encounter: ED to Hosp-Admission (Discharged) from 07/16/2019 in [facility]      Signed      Expand AllCollapse All   Show:Clear all [x] Manual[x] Template[] Copied  Added by: [x] Angiulli, Lavon Paganini, PA-C[x] Courtney Heys, MD  [] Hover for details          Physical Medicine and Rehabilitation Consult Reason for Consult: Right side weakness Referring Physician: Internal medicine     HPI: Justin [name] is a 79 y.o. 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 Per chart review patient lives with spouse. One level home three steps to entry. Presented 07/16/2019 with right side weakness and numbness. CT/MRI showed acute subcentimeter infarct of the left corona radiata centrum semiovale. Expected evolution of recent infarct. Most recent echocardiogram with ejection fraction of [id number] grade 1 diastolic dysfunction. Patient currently remains on aspirin and Plavix for CVA prophylaxis. Subcutaneous Lovenox for DVT prophylaxis. Therapy evaluations completed recommendations of physical medicine rehab consult.     Pt reports RUE OK, but weak in leg- cannot walk- Denies pain Wife said trying to change blood thinner- is expensive, but trying to get via [place].      Review of Systems  Constitutional: Negative for chills and fever.  HENT: Negative for hearing loss.   Eyes: Negative for blurred vision and double vision.  Respiratory: Negative for cough and shortness of breath.     Cardiovascular: Negative for chest pain, palpitations and leg swelling.  Gastrointestinal: Positive for constipation. Negative for heartburn, nausea and vomiting.  Genitourinary: Positive for urgency. Negative for dysuria, flank pain and hematuria.  Musculoskeletal: Positive for joint pain and myalgias.  Skin: Negative for rash.  Neurological: Positive for sensory change and weakness.  Psychiatric/Behavioral: Positive for depression. The patient has insomnia.   All other systems reviewed and are negative.       Past Medical History:  Diagnosis Date  . Brain bleed ([place])    . Cancer [facility])      prostate  . CHI (closed head injury) 04/2013    with SAH  . Depression    . Diabetes mellitus without complication ([place])    . High cholesterol    . Hypertension    . Stroke [facility])           Past Surgical History:  Procedure Laterality Date  . arm & hand surgery Left 1972    skill saw accident  . COLON SURGERY        partial  . EYE SURGERY        lens implant and cateract- Left  . PROSTATECTOMY             Family History  Problem Relation Age of Onset  . CAD Mother    . Diabetes Mother    . Emphysema Father    . Diabetes Sister    . Diabetes Brother    . Diabetes Sister    . Diabetes Brother    . Diabetes Mellitus II Neg Hx      Social History:  reports  that he has never smoked. He has never used smokeless tobacco. He reports that he does not drink alcohol or use drugs. Allergies: No Known Allergies       Medications Prior to Admission  Medication Sig Dispense Refill  . amLODipine (NORVASC) 5 MG tablet Take 0.5 tablets (2.5 mg total) by mouth daily. (Patient taking differently: Take 5 mg by mouth daily. ) 90 tablet 0  . aspirin EC 81 MG EC tablet Take 1 tablet (81 mg total) by mouth daily. 90 tablet 0  . atorvastatin (LIPITOR) 80 MG tablet TAKE 1 TABLET EVERY DAY (Patient taking differently: Take 80 mg by mouth daily. ) 90 tablet 0  . Cholecalciferol (VITAMIN D-1000 MAX ST)  1000 units tablet Take 1,000 Units by mouth daily.      . clopidogrel (PLAVIX) 75 MG tablet Take 1 tablet (75 mg total) by mouth daily. 90 tablet 4  . cyanocobalamin ([employer] VITAMIN B12) 1000 MCG tablet Take 1,000 mcg by mouth daily.       Marland Kitchen docusate sodium (COLACE) 100 MG capsule Take 1 capsule (100 mg total) by mouth 3 (three) times daily. 90 capsule 0  . fenofibrate 160 MG tablet Take 160 mg by mouth daily.      . furosemide (LASIX) 20 MG tablet Take 20 mg by mouth daily.       Marland Kitchen glipiZIDE (GLUCOTROL) 5 MG tablet Take 0.5 tablets (2.5 mg total) by mouth daily before breakfast. (Patient taking differently: Take 5 mg by mouth every evening. ) 30 tablet 0  . hydrocerin (EUCERIN) CREA Apply 1 application topically 2 (two) times daily. 454 g 0  . hydrochlorothiazide (HYDRODIURIL) 25 MG tablet Take 1 tablet (25 mg total) by mouth daily.      Marland Kitchen losartan (COZAAR) 100 MG tablet Take 100 mg by mouth daily.      . metFORMIN (GLUCOPHAGE) 500 MG tablet Take 1 tablet (500 mg total) by mouth 2 (two) times daily with a meal.      . Omega-3 Fatty Acids (FISH OIL) 1000 MG CAPS Take 1,000 mg by mouth daily with breakfast.      . polyvinyl alcohol (LIQUIFILM TEARS) 1.4 % ophthalmic solution Place 1 drop into both eyes 4 (four) times daily as needed for dry eyes.      . Semaglutide (OZEMPIC) 0.25 or 0.5 MG/DOSE SOPN Inject 0.25 mg into the skin once a week.      . senna-docusate (SENOKOT-S) 8.6-50 MG tablet Take 2 tablets by mouth at bedtime.      . sertraline (ZOLOFT) 25 MG tablet Take 1 tablet (25 mg total) by mouth daily. 90 tablet 1      Home: Home Living Family/patient expects to be discharged to:: Private residence Living Arrangements: Spouse/significant other Available Help at Discharge: Family, Available 24 hours/day Type of Home: House Home Access: Stairs to enter [employer] of Steps: 3 Entrance Stairs-Rails: Right Home Layout: One level Bathroom Shower/Tub: Tourist information centre manager: Handicapped height Bathroom Accessibility: Yes Home Equipment: Engineer, manufacturing held shower head, Shower seat, Radio producer - quad, Environmental consultant - 2 wheels Additional Comments: wife can provide 24/7 supervision  Lives With: Spouse  Functional History: Prior Function Level of Independence: Needs assistance Gait / Transfers Assistance Needed: Does his own ADLs. Uses RW for ambulation; no falls since discharge from CIR last week. ADL's / Homemaking Assistance Needed: Wife assists with IADLs and medication management. Functional Status:  Mobility: Bed Mobility Overal bed mobility: Needs Assistance Bed Mobility: Sidelying to Sit, Rolling  Rolling: Min guard Sidelying to sit: Mod assist, +2 for physical assistance, +2 for safety/equipment, HOB elevated General bed mobility comments: assist for trunk support to sitting EOB towards L side  Transfers Overall transfer level: Needs assistance Equipment used: Rolling walker (2 wheeled) Transfers: Sit to/from Stand, Stand Pivot Transfers Sit to Stand: Min assist, +2 physical assistance, +2 safety/equipment Stand pivot transfers: Mod assist, +2 physical assistance General transfer comment: initally min assist +2 safety, fading with fatigue to min +2 physical assist. Stood multiple times for peri-hygiene as pt found sitting in stool. Took a few pivotal steps to chair but difficulty advancing RLE despite assist with weight shifting and RW management; right lateral lean.   ADL: ADL Overall ADL's : Needs assistance/impaired Grooming: Minimal assistance, Sitting Upper Body Bathing: Supervision/ safety, Set up, Sitting Lower Body Bathing: Maximal assistance, Sit to/from stand, +2 for physical assistance, +2 for safety/equipment Upper Body Dressing : Sitting, Minimal assistance Lower Body Dressing: Total assistance, +2 for physical assistance, +2 for safety/equipment, Sitting/lateral leans, Sit to/from stand Toilet Transfer: Moderate assistance, +2 for safety/equipment,  RW, Stand-pivot, +2 for physical assistance Toilet Transfer Details (indicate cue type and reason): simulated to recliner  Toileting- Clothing Manipulation and Hygiene: Total assistance, Bed level Toileting - Clothing Manipulation Details (indicate cue type and reason): incontient of BM in bed Functional mobility during ADLs: Minimal assistance, Moderate assistance, +2 for physical assistance, +2 for safety/equipment General ADL Comments: pt limited by R sided weakness and impaired coordination, decreased safety awareness, impaired balance    Cognition: Cognition Overall Cognitive Status: Impaired/Different from baseline Orientation Level: Oriented X4 Cognition Arousal/Alertness: Awake/alert Behavior During Therapy: WFL for tasks assessed/performed Overall Cognitive Status: Impaired/Different from baseline Area of Impairment: Safety/judgement, Awareness, Following commands, Attention, Problem solving, Orientation Orientation Level: Time("June") Current Attention Level: Sustained Following Commands: Follows one step commands with increased time Safety/Judgement: Decreased awareness of safety, Decreased awareness of deficits Awareness: Emergent Problem Solving: Slow processing, Requires verbal cues, Difficulty sequencing General Comments: Pt found sitting in stool; pt initally reports June, but to correct to May with cueing; requires min verbal cueing to attend to task and noted increased cueing for sequencing and slow processing   Blood pressure 135/75, pulse 60, temperature 97.9 F (36.6 C), temperature source Oral, resp. rate 17, height 6' (1.829 m), weight 100.1 kg, SpO2 98 %. Physical Exam  Nursing note and vitals reviewed. Constitutional: He appears well-developed and well-nourished.  Older gentleman, sitting up in bed; watching TV- low energy; , wife in room, NAD  HENT:  Head: Normocephalic and atraumatic.  Nose: Nose normal.  Mouth/Throat: Oropharynx is clear and moist. No  oropharyngeal exudate.  R tongue deviation- mild Mild R facial droop- improved with smile  Eyes: Conjunctivae are normal. Right eye exhibits no discharge. Left eye exhibits no discharge. No scleral icterus.  EOMI B/L- no nystagmus  Neck: No tracheal deviation present.  Cardiovascular:  RRR- no JVD  Respiratory: No stridor.  CTA B/L- no W/R/R- good air movement   GI:  Soft, NT, ND, (+)BS    Musculoskeletal:     Cervical back: Normal range of motion and neck supple.     Comments: RUE- 5- to 4+/5 in deltoid, biceps, triceps, WE, grip and finger bad LUE- 5-/5 in same muscles RLE- HF 3-/5, KE and KF 4+/5, DF and PF 5-/5 LLE- 5-5 in same muscles   Neurological: He is alert.  Patient is alert in no acute distress. Makes eye contact with examiner. Oriented to person place and  year and follows simple commands. Appropriate, c/o numbness from R hip to toes- decreased sensation to Light touch in RLE nml sensation otherwise  Skin: Skin is warm and dry.  No skin breakdown seen IV L forearm- no infiltration  Psychiatric:  Low energy; appropriate      Lab Results Last 24 Hours       Results for orders placed or performed during the hospital encounter of 07/16/19 (from the past 24 hour(s))  CBG monitoring, ED     Status: Abnormal    Collection Time: 07/16/19 12:49 PM  Result Value Ref Range    Glucose-Capillary 118 (H) 70 - 99 mg/dL  CBG monitoring, ED     Status: Abnormal    Collection Time: 07/16/19 12:49 PM  Result Value Ref Range    Glucose-Capillary 118 (H) 70 - 99 mg/dL  Ethanol     Status: None    Collection Time: 07/16/19  2:04 PM  Result Value Ref Range    Alcohol, Ethyl (B) <10 <10 mg/dL  CBC with Differential/Platelet     Status: Abnormal    Collection Time: 07/16/19  2:04 PM  Result Value Ref Range    WBC 11.0 (H) 4.0 - 10.5 K/uL    RBC 5.39 4.22 - 5.81 MIL/uL    Hemoglobin 15.0 13.0 - 17.0 g/dL    HCT 45.6 39.0 - 52.0 %    MCV 84.6 80.0 - 100.0 fL    MCH 27.8 26.0 -  34.0 pg    MCHC 32.9 30.0 - 36.0 g/dL    RDW 12.6 11.5 - 15.5 %    Platelets 267 150 - 400 K/uL    nRBC 0.0 0.0 - 0.2 %    Neutrophils Relative % 73 %    Neutro Abs 7.8 (H) 1.7 - 7.7 K/uL    Lymphocytes Relative 19 %    Lymphs Abs 2.1 0.7 - 4.0 K/uL    Monocytes Relative 7 %    Monocytes Absolute 0.8 0.1 - 1.0 K/uL    Eosinophils Relative 1 %    Eosinophils Absolute 0.2 0.0 - 0.5 K/uL    Basophils Relative 0 %    Basophils Absolute 0.0 0.0 - 0.1 K/uL    Immature Granulocytes 0 %    Abs Immature Granulocytes 0.04 0.00 - 0.07 K/uL  Comprehensive metabolic panel     Status: Abnormal    Collection Time: 07/16/19  2:04 PM  Result Value Ref Range    Sodium 140 135 - 145 mmol/L    Potassium 4.1 3.5 - 5.1 mmol/L    Chloride 103 98 - 111 mmol/L    CO2 26 22 - 32 mmol/L    Glucose, Bld 95 70 - 99 mg/dL    BUN 20 8 - 23 mg/dL    Creatinine, Ser 0.86 0.61 - 1.24 mg/dL    Calcium 9.3 8.9 - 10.3 mg/dL    Total Protein 6.3 (L) 6.5 - 8.1 g/dL    Albumin 3.5 3.5 - 5.0 g/dL    AST 19 15 - 41 U/L    ALT 22 0 - 44 U/L    Alkaline Phosphatase 36 (L) 38 - 126 U/L    Total Bilirubin 0.8 0.3 - 1.2 mg/dL    GFR calc non Af Amer >60 >60 mL/min    GFR calc Af Amer >60 >60 mL/min    Anion gap 11 5 - 15  Protime-INR     Status: None    Collection Time: 07/16/19  2:04  PM  Result Value Ref Range    Prothrombin Time 13.5 11.4 - 15.2 seconds    INR 1.1 0.8 - 1.2  APTT     Status: Abnormal    Collection Time: 07/16/19  2:04 PM  Result Value Ref Range    aPTT 21 (L) 24 - 36 seconds  I-stat chem 8, ED     Status: Abnormal    Collection Time: 07/16/19  2:16 PM  Result Value Ref Range    Sodium 138 135 - 145 mmol/L    Potassium 4.1 3.5 - 5.1 mmol/L    Chloride 101 98 - 111 mmol/L    BUN 26 (H) 8 - 23 mg/dL    Creatinine, Ser 0.90 0.61 - 1.24 mg/dL    Glucose, Bld 92 70 - 99 mg/dL    Calcium, Ion 1.12 (L) 1.15 - 1.40 mmol/L    TCO2 30 22 - 32 mmol/L    Hemoglobin 14.6 13.0 - 17.0 g/dL    HCT 43.0  39.0 - 52.0 %  SARS Coronavirus 2 by RT PCR (hospital order, performed in [place] hospital lab) Nasopharyngeal Nasopharyngeal Swab     Status: None    Collection Time: 07/16/19  6:58 PM    Specimen: Nasopharyngeal Swab  Result Value Ref Range    SARS Coronavirus 2 NEGATIVE NEGATIVE  Glucose, capillary     Status: Abnormal    Collection Time: 07/16/19  9:17 PM  Result Value Ref Range    Glucose-Capillary 68 (L) 70 - 99 mg/dL  Glucose, capillary     Status: Abnormal    Collection Time: 07/16/19  9:46 PM  Result Value Ref Range    Glucose-Capillary 63 (L) 70 - 99 mg/dL  Glucose, capillary     Status: None    Collection Time: 07/16/19 10:07 PM  Result Value Ref Range    Glucose-Capillary 91 70 - 99 mg/dL  CBC     Status: None    Collection Time: 07/17/19  6:23 AM  Result Value Ref Range    WBC 7.5 4.0 - 10.5 K/uL    RBC 5.02 4.22 - 5.81 MIL/uL    Hemoglobin 13.8 13.0 - 17.0 g/dL    HCT 41.9 39.0 - 52.0 %    MCV 83.5 80.0 - 100.0 fL    MCH 27.5 26.0 - 34.0 pg    MCHC 32.9 30.0 - 36.0 g/dL    RDW 12.6 11.5 - 15.5 %    Platelets 241 150 - 400 K/uL    nRBC 0.0 0.0 - 0.2 %  Basic metabolic panel     Status: Abnormal    Collection Time: 07/17/19  6:23 AM  Result Value Ref Range    Sodium 139 135 - 145 mmol/L    Potassium 3.5 3.5 - 5.1 mmol/L    Chloride 104 98 - 111 mmol/L    CO2 27 22 - 32 mmol/L    Glucose, Bld 98 70 - 99 mg/dL    BUN 14 8 - 23 mg/dL    Creatinine, Ser 0.72 0.61 - 1.24 mg/dL    Calcium 8.6 (L) 8.9 - 10.3 mg/dL    GFR calc non Af Amer >60 >60 mL/min    GFR calc Af Amer >60 >60 mL/min    Anion gap 8 5 - 15  Glucose, capillary     Status: None    Collection Time: 07/17/19  6:24 AM  Result Value Ref Range    Glucose-Capillary 93 70 - 99 mg/dL  Comment 1 Notify RN      Comment 2 Document in Chart    Glucose, capillary     Status: None    Collection Time: 07/17/19  8:42 AM  Result Value Ref Range    Glucose-Capillary 94 70 - 99 mg/dL       Imaging  Results (Last 48 hours)  CT HEAD WO CONTRAST   Result Date: 07/16/2019 CLINICAL DATA:  New right-sided deficit, recent stroke, last known well 2:30 p.m. yesterday, right leg weakness since last evening with new inability to ambulate today EXAM: CT HEAD WITHOUT CONTRAST TECHNIQUE: Contiguous axial images were obtained from the base of the skull through the vertex without intravenous contrast. COMPARISON:  06/24/2019 FINDINGS: Brain: Diffuse hypodensities are seen throughout the periventricular white matter, consistent with chronic small vessel ischemic change. Chronic ischemic changes bilateral basal ganglia, left greater than right. No new infarct or hemorrhage. Lateral ventricles and midline structures are stable. No acute extra-axial fluid collections. No mass effect. Vascular: No hyperdense vessel or unexpected calcification. Skull: Normal. Negative for fracture or focal lesion. Sinuses/Orbits: No acute finding. Other: None. IMPRESSION: 1. Stable chronic small-vessel ischemic changes throughout the periventricular white matter and bilateral basal ganglia. 2. No evidence of acute infarct or hemorrhage. Electronically Signed   By: Randa Ngo M.D.   On: 07/16/2019 16:17    MR BRAIN WO CONTRAST   Result Date: 07/16/2019 CLINICAL DATA:  Decreased sensation and use of right arm EXAM: MRI HEAD WITHOUT CONTRAST TECHNIQUE: Multiplanar, multiecho pulse sequences of the brain and surrounding structures were obtained without intravenous contrast. COMPARISON:  06/24/2019 FINDINGS: Brain: There is a new subcentimeter focus of restricted diffusion in the left corona radiata/centrum semiovale. Expected evolution of right corona radiata/centrum semiovale infarcts. Confluent areas of T2 hyperintensity in the supratentorial white matter are nonspecific but probably reflect advanced chronic microvascular ischemic changes. There are chronic bilateral infarcts of the corona radiata. Prominence of ventricles and sulci  reflects stable generalized parenchymal volume loss superimposed on ex vacuo dilatation related to above. Stable foci of susceptibility reflecting chronic blood products. There is no intracranial mass or mass effect. Vascular: Major vessel flow voids at the skull base are preserved. Skull and upper cervical spine: Normal marrow signal is preserved. Sinuses/Orbits: Minor mucosal thickening.  Left lens replacement. Other: Sella is unremarkable.  Mastoid air cells are clear. IMPRESSION: Acute subcentimeter infarct of the left corona radiata/centrum semiovale. Expected evolution of recent infarcts seen on the prior study. Stable additional chronic findings detailed above. Electronically Signed   By: Macy Mis M.D.   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 Does the need for close, 24 hr/day medical supervision in concert with the patient's rehab needs make it unreasonable for this patient to be served in a less intensive setting? Yes 2. Co-Morbidities requiring supervision/potential complications: PVD, HTN, HLD, OSA needs CPAP, DM II 3. Due to bladder management, bowel management, safety, skin/wound care, disease management, medication administration, pain management and patient education, does the patient require 24 hr/day rehab nursing? Yes 4. Does the patient require coordinated care of a physician, rehab nurse, therapy disciplines of OT and PT to address physical and functional deficits in the context of the above medical diagnosis(es)? Yes Addressing deficits in the following areas: balance, endurance, locomotion, strength, transferring, bathing, dressing, feeding, grooming and toileting 5. Can the patient  actively participate in an intensive therapy program of at least 3 hrs of therapy per day at least 5 days per week? Yes 6. The potential for patient to  make measurable gains while on inpatient rehab is good 7. Anticipated functional outcomes upon discharge from inpatient rehab are supervision and min assist  with PT, modified independent and supervision with OT, n/a with SLP. 8. Estimated rehab length of stay to reach the above functional goals is: 10-14 days 9. Anticipated discharge destination: Home 10. Overall Rehab/Functional Prognosis: good   RECOMMENDATIONS: This patient's condition is appropriate for continued rehabilitative care in the following setting: CIR Patient has agreed to participate in recommended program. Potentially Note that insurance prior authorization may be required for reimbursement for recommended care.   Comment:  1. Pt has recurrent new stroke affecting opposite side- due to severe vascular disease- they plan on changing his blood thinner due to 2 strokes in 30 days period.  2. Is a veteran- can hopefully get new blood thinner from there 3. Pt has RLE weakness and sensory deficits, fyi 4. Pt is mod A to mod A of 2 currently 5. Will submit for insurance approval- thank you for this consult.        Lavon Paganini Angiulli, PA-C 07/17/2019    I have personally performed a face to face diagnostic evaluation of this patient and formulated the key components of the plan.  Additionally, I have personally reviewed laboratory data, imaging studies, as well as relevant notes and concur with the physician assistant's documentation above.                Revision History Date/Time User Provider Type Action  07/17/2019  6:54 PM Courtney Heys, MD Physician Sign  07/17/2019 11:46 AM Angiulli, Lavon Paganini, PA-C Physician Assistant [place]   View Details Report     Routing History

## 2019-07-19 ENCOUNTER — Inpatient Hospital Stay (HOSPITAL_COMMUNITY): Payer: Medicare HMO | Admitting: Physical Therapy

## 2019-07-19 ENCOUNTER — Inpatient Hospital Stay (HOSPITAL_COMMUNITY): Payer: Medicare HMO | Admitting: Occupational Therapy

## 2019-07-19 DIAGNOSIS — I63512 Cerebral infarction due to unspecified occlusion or stenosis of left middle cerebral artery: Secondary | ICD-10-CM

## 2019-07-19 LAB — COMPREHENSIVE METABOLIC PANEL
ALT: 19 U/L (ref 0–44)
AST: 17 U/L (ref 15–41)
Albumin: 3.1 g/dL — ABNORMAL LOW (ref 3.5–5.0)
Alkaline Phosphatase: 31 U/L — ABNORMAL LOW (ref 38–126)
Anion gap: 8 (ref 5–15)
BUN: 12 mg/dL (ref 8–23)
CO2: 26 mmol/L (ref 22–32)
Calcium: 8.7 mg/dL — ABNORMAL LOW (ref 8.9–10.3)
Chloride: 104 mmol/L (ref 98–111)
Creatinine, Ser: 0.88 mg/dL (ref 0.61–1.24)
GFR calc Af Amer: >60 mL/min (ref >60)
GFR calc non Af Amer: >60 mL/min (ref >60)
Glucose, Bld: 107 mg/dL — ABNORMAL HIGH (ref 70–99)
Potassium: 4 mmol/L (ref 3.5–5.1)
Sodium: 138 mmol/L (ref 135–145)
Total Bilirubin: 0.6 mg/dL (ref 0.3–1.2)
Total Protein: 5.5 g/dL — ABNORMAL LOW (ref 6.5–8.1)

## 2019-07-19 LAB — CBC WITH DIFFERENTIAL/PLATELET
Abs Immature Granulocytes: 0.05 10*3/uL (ref 0.00–0.07)
Basophils Absolute: 0.1 10*3/uL (ref 0.0–0.1)
Basophils Relative: 1 %
Eosinophils Absolute: 0.5 10*3/uL (ref 0.0–0.5)
Eosinophils Relative: 5 %
HCT: 41.7 % (ref 39.0–52.0)
Hemoglobin: 13.8 g/dL (ref 13.0–17.0)
Immature Granulocytes: 1 %
Lymphocytes Relative: 20 %
Lymphs Abs: 2.2 10*3/uL (ref 0.7–4.0)
MCH: 27.4 pg (ref 26.0–34.0)
MCHC: 33.1 g/dL (ref 30.0–36.0)
MCV: 82.9 fL (ref 80.0–100.0)
Monocytes Absolute: 0.6 10*3/uL (ref 0.1–1.0)
Monocytes Relative: 6 %
Neutro Abs: 7.6 10*3/uL (ref 1.7–7.7)
Neutrophils Relative %: 67 %
Platelets: 254 10*3/uL (ref 150–400)
RBC: 5.03 MIL/uL (ref 4.22–5.81)
RDW: 12.5 % (ref 11.5–15.5)
WBC: 11.1 10*3/uL — ABNORMAL HIGH (ref 4.0–10.5)
nRBC: 0 % (ref 0.0–0.2)

## 2019-07-19 LAB — GLUCOSE, CAPILLARY
Glucose-Capillary: 100 mg/dL — ABNORMAL HIGH (ref 70–99)
Glucose-Capillary: 144 mg/dL — ABNORMAL HIGH (ref 70–99)
Glucose-Capillary: 116 mg/dL — ABNORMAL HIGH (ref 70–99)
Glucose-Capillary: 130 mg/dL — ABNORMAL HIGH (ref 70–99)

## 2019-07-19 NOTE — Evaluation (Signed)
Physical Therapy Assessment and Plan  Patient Details  Name: Justin [name] MRN: [id number] Date of Birth: Mar 03, 1940  PT Diagnosis: Abnormal posture, Abnormality of gait, Cognitive deficits, Difficulty walking, Hemiparesis dominant, Impaired cognition and Muscle weakness Rehab Potential: Good ELOS: ~2-2.5 weeks   Today's Date: 07/19/2019 PT Individual Time: 4765-4650 and 3546-5681 PT Individual Time Calculation (min): 61 min and 30 min    Problem List:  Patient Active Problem List   Diagnosis Date Noted  . 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 OSA (obstructive sleep apnea)   . Controlled type 2 diabetes mellitus with hyperglycemia, without long-term current use of insulin ([place])   . CVA (cerebral vascular accident) ([place]) 07/16/2019  . Dyslipidemia   . Benign essential HTN   . Diabetes mellitus type 2 in obese ([place])   . Major depressive disorder, recurrent episode, moderate ([place])   . Gait disorder   . Acute right MCA stroke ([place]) 06/28/2019  . Ambulatory dysfunction   . Stroke ([place]) 06/24/2019  . Type 2 diabetes mellitus ([place]) 10/04/2018  . Depression 10/04/2018  . Acute CVA (cerebrovascular accident) ([place]) 09/18/2017  . Stroke (cerebrum) ([place]) 09/17/2017  . Diabetes mellitus without complication ([place]) 27/51/7001  . Closed head injury 04/22/2013  . HLD (hyperlipidemia) 07/29/2009  . Essential hypertension 07/29/2009  . EFFUSION, PLEURAL 07/29/2009  . DIVERTICULITIS, COLON 07/29/2009  . 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 COLONIC POLYPS, HYPERPLASTIC, HX OF 07/29/2009    Past Medical History:  Past Medical History:  Diagnosis Date  . Brain bleed ([place])   . Cancer [facility])    prostate  . 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 Depression   . Diabetes mellitus without complication ([place])   . High cholesterol   . Hypertension   . Stroke [facility])    Past Surgical History:  Past Surgical History:  Procedure Laterality Date  . arm & hand surgery Left  1972   skill saw accident  . COLON SURGERY     partial  . EYE SURGERY     lens implant and cateract- Left  . PROSTATECTOMY      Assessment & Plan Clinical Impression: Patient is a 79 y.o. 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  History taken from chart review and patient.  Patient lives with spouse 1 level home 3 steps to entry.  Presented on 07/16/2019 with right hemiparesis and sensory loss.  CT/MRI showed acute subcentimeter infarct of the left corona radiata centrum semiovale.  Expected evolution of recent infarct.  Most recent echocardiogram with ejection fraction of 70% with grade 1 diastolic dysfunction.  CT angiogram 06/24/2019 showed no intracranial arterial occlusion or high-grade stenosis.  Neurology follow-up he remains on aspirin and Plavix for CVA prophylaxis.  Subcutaneous Lovenox for DVT prophylaxis.  Tolerating a regular diet.  Therapy evaluations completed and patient was admitted for a comprehensive rehab program. Patient transferred to CIR on 07/18/2019 .   Patient currently requires mod with mobility secondary to muscle weakness and muscle joint tightness, decreased cardiorespiratoy endurance, impaired timing and sequencing, unbalanced muscle  activation, motor apraxia and decreased motor planning,  , decreased initiation, decreased attention, decreased awareness, decreased problem solving, decreased safety awareness, decreased memory and delayed processing and decreased  sitting balance, decreased standing balance, decreased postural control and decreased balance strategies.  Prior to hospitalization, patient was supervision with mobility and lived with Spouse in a House home.  Home access is 3Stairs to enter.  Patient will benefit from skilled PT intervention to maximize safe functional mobility, minimize fall risk and decrease caregiver burden for planned discharge home with 24 hour supervision.  Anticipate patient will benefit from follow up [place] at discharge.  PT - End of Session Activity Tolerance: Tolerates 30+ min activity with multiple rests Endurance Deficit: Yes Endurance Deficit Description: requires seated rest breaks PT Assessment Rehab Potential (ACUTE/IP ONLY): Good PT Barriers to Discharge: [place] home environment;Home environment access/layout;Incontinence;Medical stability;Neurogenic Bowel & Bladder;Decreased caregiver support PT Patient demonstrates impairments in the following area(s): Balance;Safety;Behavior;Sensory;Edema;Skin Integrity;Endurance;Motor;Nutrition;Pain;Perception PT Transfers Functional Problem(s): Bed Mobility;Bed to Chair;Car;Furniture PT Locomotion Functional Problem(s): Ambulation;Wheelchair Mobility;Stairs PT Plan PT Intensity: Minimum of 1-2 x/day ,45 to 90 minutes PT Frequency: 5 out of 7 days PT Duration Estimated Length of Stay: ~2-2.5 weeks PT Treatment/Interventions: Ambulation/gait training;Community reintegration;DME/adaptive equipment instruction;Neuromuscular re-education;Psychosocial support;Stair training;UE/LE Strength taining/ROM;Wheelchair propulsion/positioning;Balance/vestibular training;Discharge planning;Functional electrical stimulation;Pain management;Skin care/wound management;UE/LE Coordination activities;Therapeutic Activities;Cognitive remediation/compensation;Disease management/prevention;Functional mobility training;Patient/family education;Splinting/orthotics;Visual/perceptual  remediation/compensation;Therapeutic Exercise PT Transfers Anticipated Outcome(s): CGA PT Locomotion Anticipated Outcome(s): CGA using LRAD PT Recommendation Follow Up Recommendations: Home health PT;24 hour supervision/assistance Patient destination: Home Equipment Recommended: To be determined Equipment Details: has RW and transport chair  Skilled Therapeutic Intervention Session 1: Evaluation completed (see details above and below) with education on PT POC and goals and individual treatment initiated with focus on bed mobility, transfers, gait training, activity tolerance, and education regarding daily therapy schedule, weekly team meetings, purpose of PT evaluation, and other CIR information. Pt received supine in bed with RN arriving immediately after therapist for medication administration. Pt noted to be soiled of urine - RN notified for timed toileting protocol. Rolling R/L in bed with mod assist not using bedrials while therapist performed total assist LB clothing management and peri-care to ensure cleanliness. Supine>sitting L EOB, HOB flat and not using bedrails, with mod assist for trunk upright. Sitting EOB donned shirt and pants max assist for time management. Sit>stand EOB>RW with mod assist for lifting into stance - standing with min assist for balance while therapist pulled pants over hips. L stand pivot to w/c using RW with mod assist for balance due to R posterior lean and lack of adequate stepping.  Transported to/from gym in w/c for time management and energy conservation. Simulated stand pivot car transfer (SUV height) using RW with mod assist for lifting and balance while turning with min cuing for sequencing of task. Gait training 72f using RW with mod assist for balance due to R lateral lean - demonstrates R LE paresis with lack of R foot clearance during swing and R knee instability during stance - lack of adequate L lateral weight shift during stance as well - significantly  decreased gait speed and significantly increased difficulty ambulating this distance. R stand pivot to w/c using RW with mod assist for lifting/balance and 2x assist for increasing R lateral step length during transfer to improve BOS and balance. Therapist retrieved w/c cushion for increased pressure relief and improved upright OOB activity tolerance. Transported back to room and left seated in w/c with needs in reach and seat belt alarm on.  Session  2: Pt received sitting in wheelchair with his wife present and pt agreeable to therapy session. [name] reports she noticed new impaired strength or coordination in R UE when they were playing a card game today (as this is something they did frequently during his last admission) - pt performed same task at this time with no significant, obvious impairments but pt's wife stated it was more impaired earlier today - will monitor.  Transported to/from gym in w/c for time management and energy conservation. Sit<>stands with min/mod assist to come to stand during session - repeated cuing for proper UE placement using RW, improved R LE positioning, and increased anterior weight shift. Gait training ~72f using RW with mod assist for balance - continues to demonstrate R LE paresis with inability to clear foot from floor during swing phase causing significantly decreased step length as well as impaired stance control with R knee instability. Standing R LE stance control task using UE support on RW and min/mod assist for balance while performing forward L foot taps on 6" step - demonstrates slight R knee flexion but no buckling; has forward trunk flexed posture with lack of adequate hip extension or glute activation. Progressed to L lateral foot taps on 6" step using RW but pt with worsening forward trunk flexion therefore removed AD and used R UE support over therapist's shoulders with mod assist for balance but pt unable to get foot to step therefore had to perform pre-gait  stepping L foot forward/backwards. Reports some L knee pain - performed seated knee flexion/extension AROM for pain management and symptoms decreased. R stand pivot to w/c using RW with mod assist for balance and max cuing for sequencing of steps - required assist 1x to increase R lateral step length. Transported back to room and left seated in w/c with needs in reach, his wife present, and seat belt alarm on.  PT Evaluation Precautions/Restrictions Precautions Precautions: Fall Precaution Comments: R lateral lean, R hemparesis (LE>UE) Restrictions Weight Bearing Restrictions: No Pain Pain Assessment Pain Scale: 0-10 Pain Score: 0-No pain Home Living/Prior Functioning Home Living Available Help at Discharge: Family;Available 24 hours/day Type of Home: House Home Access: Stairs to enter [employer] Steps: 3 Entrance Stairs-Rails: Left Home Layout: One level  Lives With: Spouse Prior Function Level of Independence: Requires assistive device for independence(after most recent admission pt D/C'd on 5/21 at ambulatory level using RW with CGA/close supervision)  Able to Take Stairs?: Yes Driving: No Vocation: Retired PCytogeneticist Within FAdvertising copywriterPraxis Praxis: Impaired Praxis Impairment Details: MEngineer, productionOverall Cognitive Status: History of cognitive impairments - at baseline(pt receiving SLP services during prior admission) Arousal/Alertness: Awake/alert Orientation Level: Oriented X4 Attention: Focused;Sustained Focused Attention: Appears intact Memory: Impaired Problem Solving: Impaired Safety/Judgment: Impaired Sensation Sensation Light Touch: Appears Intact Hot/Cold: Not tested Proprioception: Impaired by gross assessment(demonstrates impaired proprioceptive awareness or impaired dual-cognitive task of awareness of LEs (R LE more impaired) in space during functional tasks) Stereognosis: Not  tested Coordination Gross Motor Movements are Fluid and Coordinated: No Coordination and Movement Description: impaired due to R LE paresis, impaired balance, impaired motor planning and impaired dual-task Heel Shin Test: R LE slow and decreased ROM due to paresis Motor  Motor Motor: Hemiplegia;Abnormal postural alignment and control Motor - Skilled Clinical Observations: R hemiparesis with R posterior lean in standing  Mobility Bed Mobility Bed Mobility: Supine to Sit;Rolling Right;Rolling Left;Sit to Supine Rolling Right: Moderate Assistance - Patient 50-74% Rolling Left: Moderate Assistance -  Patient 50-74% Supine to Sit: Moderate Assistance - Patient 50-74% Sit to Supine: Moderate Assistance - Patient 50-74% Transfers Transfers: Sit to Stand;Stand to Sit;Stand Pivot Transfers Sit to Stand: Moderate Assistance - Patient 50-74% Stand to Sit: Moderate Assistance - Patient 50-74% Stand Pivot Transfers: Moderate Assistance - Patient 50 - 74% Stand Pivot Transfer Details: Verbal cues for sequencing;Verbal cues for technique;Verbal cues for precautions/safety;Verbal cues for safe use of DME/AE;Verbal cues for gait pattern;Manual facilitation for weight bearing;Manual facilitation for weight shifting;Manual facilitation for placement;Tactile cues for sequencing;Tactile cues for weight shifting;Tactile cues for initiation;Tactile cues for posture;Tactile cues for placement Transfer (Assistive device): Rolling walker Locomotion  Gait Ambulation: Yes Gait Assistance: Moderate Assistance - Patient 50-74% Gait Distance (Feet): 15 Feet Assistive device: Rolling walker Gait Assistance Details: Manual facilitation for placement;Manual facilitation for weight shifting;Verbal cues for technique;Verbal cues for sequencing;Verbal cues for gait pattern;Verbal cues for safe use of DME/AE;Tactile cues for weight beaing;Tactile cues for weight shifting;Tactile cues for sequencing;Tactile cues for  placement;Tactile cues for initiation;Tactile cues for posture Gait Gait: Yes Gait Pattern: Impaired Gait Pattern: Step-to pattern;Decreased stride length;Decreased stance time - left;Decreased step length - right;Decreased step length - left;Poor foot clearance - left;Poor foot clearance - right;Lateral trunk lean to right;Trunk flexed Gait velocity: significantly decreased Stairs / Additional Locomotion Stairs: No Wheelchair Mobility Wheelchair Mobility: No  Trunk/Postural Assessment  Cervical Assessment Cervical Assessment: Exceptions to WFL(forward head) Thoracic Assessment Thoracic Assessment: Exceptions to WFL(rounded shoulders, kyphotic) Lumbar Assessment Lumbar Assessment: Exceptions to WFL(posterior pelvic tilt in sitting) Postural Control Postural Control: Deficits on evaluation Trunk Control: decreased sitting balance and lack of adequate forward weight shift during transfers Postural Limitations: decreased with R lateral trunk flexion in standing and more significantly impaired R LE paresis  Balance Balance Balance Assessed: Yes Static Sitting Balance Static Sitting - Balance Support: Feet supported Static Sitting - Level of Assistance: 5: Stand by assistance Dynamic Sitting Balance Dynamic Sitting - Balance Support: During functional activity Dynamic Sitting - Level of Assistance: 5: Stand by assistance;4: Min assist Static Standing Balance Static Standing - Balance Support: During functional activity;Bilateral upper extremity supported Static Standing - Level of Assistance: 4: Min assist Dynamic Standing Balance Dynamic Standing - Balance Support: During functional activity;Bilateral upper extremity supported Dynamic Standing - Level of Assistance: 3: Mod assist Dynamic Standing - Balance Activities: Lateral lean/weight shifting;Forward lean/weight shifting;Reaching for objects;Reaching across midline Extremity Assessment      RLE Assessment RLE Assessment:  Exceptions to [facility] Active Range of Motion (AROM) Comments: WFL RLE Strength Right Hip Flexion: 3+/5 Right Knee Flexion: 3+/5 Right Knee Extension: 4-/5 Right Ankle Dorsiflexion: 4/5 Right Ankle Plantar Flexion: 4/5 LLE Assessment LLE Assessment: Within Functional Limits Active Range of Motion (AROM) Comments: WFL General Strength Comments: 5/5 assessed in supine    Refer to Care Plan for Long Term Goals  Recommendations for other services: None    Discharge Criteria: Patient will be discharged from PT if patient refuses treatment 3 consecutive times without medical reason, if treatment goals not met, if there is a change in medical status, if patient makes no progress towards goals or if patient is discharged from hospital.  The above assessment, treatment plan, treatment alternatives and goals were discussed and mutually agreed upon: by patient and by family  Tawana Scale, PT, DPT 07/19/2019, 7:47 AM

## 2019-07-19 NOTE — IPOC Note (Addendum)
Overall Plan of Care [facility]) Patient Details Name: Justin [name] MRN: [id number] DOB: 1941/02/15  Admitting Diagnosis: Left middle cerebral artery stroke [facility])  Hospital Problems: Principal Problem:   Left middle cerebral artery stroke [facility])     Functional Problem List: Nursing Bladder, Endurance, Motor, Sensory  PT Balance, Safety, Behavior, Sensory, Edema, Skin Integrity, Endurance, Motor, Nutrition, Pain, Perception  OT Balance, Cognition, Edema, Motor  SLP    TR         Basic ADL's: OT Grooming, Bathing, Dressing, Toileting     Advanced  ADL's: OT       Transfers: PT Bed Mobility, Bed to Chair, Car, Furniture  OT (P) Tub/Shower     Locomotion: PT Ambulation, Emergency planning/management officer, Stairs     Additional Impairments: OT    SLP        TR      Anticipated Outcomes Item Anticipated Outcome  Self Feeding independent  Swallowing      Basic self-care  supervision  Toileting  supervision   Bathroom Transfers supervision  Bowel/Bladder  Pt will manage bowel and bladder with min assist  Transfers  CGA  Locomotion  CGA using LRAD  Communication     Cognition     Pain  <3 out of 10.  Safety/Judgment  PT will remain free of falls with injury while in rehab.   Therapy Plan: PT Intensity: Minimum of 1-2 x/day ,45 to 90 minutes PT Frequency: 5 out of 7 days PT Duration Estimated Length of Stay: ~2-2.5 weeks OT Intensity: Minimum of 1-2 x/day, 45 to 90 minutes OT Frequency: 5 out of 7 days OT Duration/Estimated Length of Stay: 2-3 weeks     Due to the current state of emergency, patients may not be receiving their 3-hours of Medicare-mandated therapy.   Team Interventions: Nursing Interventions Patient/Family Education, Bladder Management, Disease Management/Prevention, Medication Management, Pain Management, Discharge Planning  PT interventions Ambulation/gait training, Community reintegration, DME/adaptive equipment instruction, Neuromuscular  re-education, Psychosocial support, Stair training, UE/LE Strength taining/ROM, Wheelchair propulsion/positioning, Training and development officer, Discharge planning, Functional electrical stimulation, Pain management, Skin care/wound management, UE/LE Coordination activities, Therapeutic Activities, Cognitive remediation/compensation, Disease management/prevention, Functional mobility training, Patient/family education, Splinting/orthotics, Visual/perceptual remediation/compensation, Therapeutic Exercise  OT Interventions Balance/vestibular training, Neuromuscular re-education, Self Care/advanced ADL retraining, Therapeutic Exercise, Cognitive remediation/compensation, DME/adaptive equipment instruction, UE/LE Strength taining/ROM, Community reintegration, Patient/family education, UE/LE Coordination activities, Discharge planning, Functional mobility training, Psychosocial support, Therapeutic Activities  SLP Interventions    TR Interventions    SW/CM Interventions     Barriers to Discharge MD  Medical stability  Nursing Medical stability    PT Inaccessible home environment, Home environment access/layout, Incontinence, Medical stability, Neurogenic Bowel & Bladder, Decreased caregiver support    OT      SLP      SW       Team Discharge Planning: Destination: PT-Home ,OT- Home , SLP-  Projected Follow-up: PT-Home health PT, 24 hour supervision/assistance, OT-  Home health OT, SLP-  Projected Equipment Needs: PT-To be determined, OT- To be determined, SLP-  Equipment Details: PT-has RW and transport chair, OT-  Patient/family involved in discharge planning: PT- Patient, Family member/caregiver,  OT- , SLP-   MD ELOS: 10-14d Medical Rehab Prognosis:  Good Assessment:  79 year old right-handed male with history of CHI/SAH March 2015, diabetes mellitus, hyperlipidemia, hypertension, lumbar radiculopathy with gait disorder, 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  History taken from chart review and patient.  Patient lives with spouse 1 level home 3 steps to entry.  Presented on 07/16/2019 with right hemiparesis and sensory loss.  CT/MRI showed acute subcentimeter infarct of the left corona radiata centrum semiovale.  Expected evolution of recent infarct.  Most recent echocardiogram with ejection fraction of 70% with grade 1 diastolic dysfunction.  CT angiogram 06/24/2019 showed no intracranial arterial occlusion or high-grade stenosis.  Neurology follow-up he remains on aspirin and Plavix for CVA prophylaxis.  Subcutaneous Lovenox for DVT prophylaxis.  Tolerating a regular diet.  Therapy evaluations completed and patient was admitted for a comprehensive rehab program.    Now requiring 24/7 Rehab RN,MD, as well as CIR level PT, OT and SLP.  Treatment team will focus on ADLs and mobility as well as cognition  See Team Conference Notes for weekly updates to the plan of care

## 2019-07-19 NOTE — Progress Notes (Signed)
Social Work Assessment and Plan   Patient Details  Name: Justin [name] MRN: [id number] Date of Birth: 17-Jun-1940  Today's Date: 07/19/2019  Problem List:  Patient Active Problem List   Diagnosis Date Noted  . 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 OSA (obstructive sleep apnea)   . Controlled type 2 diabetes mellitus with hyperglycemia, without long-term current use of insulin ([place])   . CVA (cerebral vascular accident) ([place]) 07/16/2019  . Dyslipidemia   . Benign essential HTN   . Diabetes mellitus type 2 in obese ([place])   . Major depressive disorder, recurrent episode, moderate ([place])   . Gait disorder   . Acute right MCA stroke ([place]) 06/28/2019  . Ambulatory dysfunction   . Stroke ([place]) 06/24/2019  . Type 2 diabetes mellitus ([place]) 10/04/2018  . Depression 10/04/2018  . Acute CVA (cerebrovascular accident) ([place]) 09/18/2017  . Stroke (cerebrum) ([place]) 09/17/2017  . Diabetes mellitus without complication ([place]) [id number]  . Closed head injury 04/22/2013  . HLD (hyperlipidemia) 07/29/2009  . Essential hypertension 07/29/2009  . EFFUSION, PLEURAL 07/29/2009  . DIVERTICULITIS, COLON 07/29/2009  . 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 COLONIC POLYPS, HYPERPLASTIC, HX OF 07/29/2009   Past Medical History:  Past Medical History:  Diagnosis Date  . Brain bleed ([place])   . Cancer [facility])    prostate  . 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 Depression   . Diabetes mellitus without complication ([place])   . High cholesterol   . Hypertension   . Stroke [facility])    Past Surgical History:  Past Surgical History:  Procedure Laterality Date  . arm & hand surgery Left 1972   skill saw accident  . COLON SURGERY     partial  . EYE SURGERY     lens implant and cateract- Left  . PROSTATECTOMY     Social History:  reports that he has never smoked. He has never used smokeless tobacco. He reports that he does not drink alcohol or use drugs.  Family / Support Systems     Social History Preferred language: English Religion: Baptist     Abuse/Neglect Abuse/Neglect Assessment Can Be Completed: Unable to assess, patient is non-responsive or altered mental status Physical Abuse: Denies Verbal Abuse: Denies Sexual Abuse: Denies Exploitation of patient/patient's resources: Denies Self-Neglect: Denies  Emotional Status    Patient / Family Perceptions, Expectations & Paramedic Living Arrangements: Spouse/significant other Support Systems: Spouse/significant other Type of Residence: Private residence Does the patient have any problems obtaining your medications?: No  Clinical Impression Patient d/c less than 30 days. Refer to previous assessment above.  Dyanne Iha 07/19/2019, 11:00 AM

## 2019-07-19 NOTE — Progress Notes (Signed)
[facility] Individual Statement of Services  Patient Name:  Justin [name]  Date:  07/19/2019  Welcome to the [facility].  Our goal is to provide you with an individualized program based on your diagnosis and situation, designed to meet your specific needs.  With this comprehensive rehabilitation program, you will be expected to participate in at least 3 hours of rehabilitation therapies Monday-Friday, with modified therapy programming on the weekends.  Your rehabilitation program will include the following services:  Physical Therapy (PT), Occupational Therapy (OT), Speech Therapy (ST), 24 hour per day rehabilitation nursing, Therapeutic Recreaction (TR), Neuropsychology, Care Coordinator, Rehabilitation Medicine, Nutrition Services, Pharmacy Services and Other  Weekly team conferences will be held on Wednesdays to discuss your progress.  Your Inpatient Rehabilitation Care Coordinator will talk with you frequently to get your input and to update you on team discussions.  Team conferences with you and your family in attendance may also be held.  Expected length of stay: 10-14 Days  Overall anticipated outcome: Min A to Supervision Depending on your progress and recovery, your program may change. Your Inpatient Rehabilitation Care Coordinator will coordinate services and will keep you informed of any changes. Your Inpatient Rehabilitation Care Coordinator's name and contact numbers are listed  below.  The following services may also be recommended but are not provided by the [facility]:    [place] will be made to provide these services after discharge if needed.  Arrangements include referral to agencies that provide these services.  Your insurance has been verified to be:  [employer] Your primary doctor is:  Anastasia Pall, ND  Pertinent information will  be shared with your doctor and your insurance company.  Inpatient Rehabilitation Care Coordinator:  Erlene Quan, [place] or [phone]  Information discussed with and copy given to patient by: Dyanne Iha, 07/19/2019, 11:01 AM

## 2019-07-19 NOTE — Plan of Care (Signed)
LTGs established °

## 2019-07-19 NOTE — Progress Notes (Signed)
[facility] PHYSICAL MEDICINE & REHABILITATION PROGRESS NOTE   Subjective/Complaints:  No issues overnite, discussed new CVA and treatment   ROS- neg CP, SOB, N/V/D  Objective:   No results found. Recent Labs    07/17/19 0623 07/19/19 0543  WBC 7.5 11.1*  HGB 13.8 13.8  HCT 41.9 41.7  PLT 241 254   Recent Labs    07/17/19 0623 07/19/19 0543  NA 139 138  K 3.5 4.0  CL 104 104  CO2 27 26  GLUCOSE 98 107*  BUN 14 12  CREATININE 0.72 0.88  CALCIUM 8.6* 8.7*    Intake/Output Summary (Last 24 hours) at 07/19/2019 0748 Last data filed at 07/18/2019 2007 Gross per 24 hour  Intake 240 ml  Output 150 ml  Net 90 ml     Physical Exam: Vital Signs Blood pressure (!) 149/66, pulse (!) 59, temperature 97.8 F (36.6 C), temperature source Oral, resp. rate 16, height 6' (1.829 m), weight 98.8 kg, SpO2 99 %.   General: No acute distress Mood and affect are appropriate Heart: Regular rate and rhythm no rubs murmurs or extra sounds Lungs: Clear to auscultation, breathing unlabored, no rales or wheezes Abdomen: Positive bowel sounds, soft nontender to palpation, nondistended Extremities: No clubbing, cyanosis, or edema Skin: No evidence of breakdown, no evidence of rash Neurologic: Cranial nerves II through XII intact, motor strength is 5/5 in bilateral deltoid, bicep, tricep, grip, 3- right and 4/5 left hip flexor, knee extensors, ankle dorsiflexor and plantar flexor Sensory exam normal sensation to light touch and proprioception in bilateral upper and lower extremities  Musculoskeletal: decreased AROM RLE. No joint swelling    Assessment/Plan: 1. Functional deficits secondary to acute left corona radiata, centrum semiovale which require 3+ hours per day of interdisciplinary therapy in a comprehensive inpatient rehab setting.  Physiatrist is providing close team supervision and 24 hour management of active medical problems listed below.  Physiatrist and rehab team  continue to assess barriers to discharge/monitor patient progress toward functional and medical goals  Care Tool:  Bathing              Bathing assist       Upper Body Dressing/Undressing Upper body dressing        Upper body assist      Lower Body Dressing/Undressing Lower body dressing            Lower body assist       Toileting Toileting    Toileting assist       Transfers Chair/bed transfer  Transfers assist     Chair/bed transfer assist level: Moderate Assistance - Patient 50 - 74%     Locomotion Ambulation   Ambulation assist              Walk 10 feet activity   Assist           Walk 50 feet activity   Assist           Walk 150 feet activity   Assist           Walk 10 feet on uneven surface  activity   Assist           Wheelchair     Assist               Wheelchair 50 feet with 2 turns activity    Assist            Wheelchair 150 feet activity     Assist  Blood pressure (!) 149/66, pulse (!) 59, temperature 97.8 F (36.6 C), temperature source Oral, resp. rate 16, height 6' (1.829 m), weight 98.8 kg, SpO2 99 %.  Medical Problem List and Plan: 1.  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  Antithrombotics: -DVT/anticoagulation: Lovenox             -antiplatelet therapy: Aspirin 325 mg daily and Plavix 75 mg daily x3 months then Plavix alone 3. Pain Management: Tylenol as needed 4. Mood: Zoloft 25 mg daily             -antipsychotic agents: N/A 5. Neuropsych: This patient is capable of making decisions on his  own behalf. 6. Skin/Wound Care: Routine skin checks 7. Fluids/Electrolytes/Nutrition: Routine in and outs.  CMP ordered for tomorrow. 8.  Permissive hypertension.  Monitor with increased mobility.  Patient on Norvasc 5 mg daily, Lasix 20 mg daily, HCTZ 25 mg daily, Cozaar 100 mg daily prior to admission.  Resume as needed          Vitals:   07/18/19 1942 07/19/19 0517  BP: 130/69 (!) 149/66  Pulse: 73 (!) 59  Resp: 17 16  Temp: 97.7 F (36.5 C) 97.8 F (36.6 C)  SpO2: 96% 99%   10.  Diabetes mellitus with hyperglycemia.  Latest hemoglobin A1c 7.2.  SSI.  Patient on Glucotrol 2.5 mg and Glucophage 500 mg twice daily and Ozempic 0.25 mg weekly prior to admission.  Resume as needed CBG (last 3)  Recent Labs    07/18/19 1644 07/18/19 2058 07/19/19 0605  GLUCAP 187* 148* 100*               Monitor the increased mobility 11.  Hyperlipidemia.  Lipitor/fenofibrate 12.  OSA.  Continue CPAP    LOS: 1 days A FACE TO FACE EVALUATION WAS PERFORMED  Charlett Blake 07/19/2019, 7:48 AM

## 2019-07-19 NOTE — Plan of Care (Signed)
  Problem: RH BLADDER ELIMINATION Goal: RH STG MANAGE BLADDER WITH ASSISTANCE Description: STG Manage Bladder With Min Assistance 07/19/2019 1045 by Ander Slade, RN Outcome: Not Progressing; time toileting

## 2019-07-19 NOTE — Evaluation (Addendum)
Occupational Therapy Assessment and Plan  Patient Details  Name: Justin [name] MRN: [id number] Date of Birth: 01/01/1941  OT Diagnosis: altered mental status, apraxia, cognitive deficits, hemiplegia affecting dominant side, muscular wasting and disuse atrophy and muscle weakness (generalized) Rehab Potential:   Good  ELOS: 2-3 weeks   Today's Date: 07/19/2019 OT Individual Time: 11:05-12:15 and 1314-1400  OT Individual Time Calculation (min): 46 min  And 70 min  Problem List:  Patient Active Problem List   Diagnosis Date Noted  . 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 OSA (obstructive sleep apnea)   . Controlled type 2 diabetes mellitus with hyperglycemia, without long-term current use of insulin ([place])   . CVA (cerebral vascular accident) ([place]) 07/16/2019  . Dyslipidemia   . Benign essential HTN   . Diabetes mellitus type 2 in obese ([place])   . Major depressive disorder, recurrent episode, moderate ([place])   . Gait disorder   . Acute right MCA stroke ([place]) 06/28/2019  . Ambulatory dysfunction   . Stroke ([place]) 06/24/2019  . Type 2 diabetes mellitus ([place]) 10/04/2018  . Depression 10/04/2018  . Acute CVA (cerebrovascular accident) ([place]) 09/18/2017  . Stroke (cerebrum) ([place]) 09/17/2017  . Diabetes mellitus without complication ([place]) 75/17/0017  . Closed head injury 04/22/2013  . HLD (hyperlipidemia) 07/29/2009  . Essential hypertension 07/29/2009  . EFFUSION, PLEURAL 07/29/2009  . DIVERTICULITIS, COLON 07/29/2009  . 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 COLONIC POLYPS, HYPERPLASTIC, HX OF 07/29/2009    Past Medical History:  Past Medical History:  Diagnosis Date  . Brain bleed ([place])   . Cancer [facility])    prostate  . 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 Depression   . Diabetes mellitus without complication ([place])   . High cholesterol   . Hypertension   . Stroke [facility])    Past Surgical History:  Past Surgical History:  Procedure Laterality Date  . arm  & hand surgery Left 1972   skill saw accident  . COLON SURGERY     partial  . EYE SURGERY     lens implant and cateract- Left  . PROSTATECTOMY      Assessment & Plan Clinical Impression: Patient is a 79 y.o. year old male with recent hospitalization with history of CHI/SAH March 2015, diabetes mellitus, hyperlipidemia, hypertension, lumbar radiculopathy with gait disorder, multiple prior CVAs with extensive prior evaluations including TEE as well as loop recorder with last being 8/20 with minimal residual effects as well as right MCA infarction 06/24/2019 received inpatient rehab services through 07/13/2019 discharge to home at Supervision level. Presented on 07/16/2019 with right hemiparesis and sensory loss.  CT/MRI showed acute subcentimeter infarct of the left corona radiata centrum semiovale. Most recent echocardiogram with ejection fraction of 70% with grade 1 diastolic dysfunction. Patient transferred to CIR on 07/18/2019 .    Patient currently requires mod with basic self-care skills secondary to muscle weakness, decreased cardiorespiratoy endurance, impaired timing and sequencing, unbalanced muscle activation, motor apraxia, decreased coordination and decreased motor planning, decreased initiation, decreased awareness, decreased problem solving, decreased safety awareness and delayed processing and decreased standing balance, decreased postural control and decreased balance strategies.  Prior to hospitalization, patient could complete BADLs  with supervision from wife.  Patient will benefit from skilled intervention to decrease level of assist with basic self-care skills and increase independence with basic self-care skills prior to discharge home with care partner.  Anticipate patient will require 24 hour supervision and follow up home health.  OT - End of Session Activity Tolerance: Tolerates 10 - 20 min activity with multiple rests Endurance Deficit: Yes Endurance Deficit Description:  requires rest breaks throughout ADL routines OT Assessment OT Patient demonstrates impairments in the following area(s): Balance;Cognition;Edema;Motor OT Basic ADL's Functional Problem(s): Grooming;Bathing;Dressing;Toileting OT Transfers Functional Problem(s): (P) Tub/Shower OT Plan OT Intensity: Minimum of 1-2 x/day, 45 to 90 minutes OT Frequency: 5 out of 7 days OT Duration/Estimated Length of Stay: 2-3 weeks OT Treatment/Interventions: Balance/vestibular training;Neuromuscular re-education;Self Care/advanced ADL retraining;Therapeutic Exercise;Cognitive remediation/compensation;DME/adaptive equipment instruction;UE/LE Strength taining/ROM;Community reintegration;Patient/family education;UE/LE Coordination activities;Discharge planning;Functional mobility training;Psychosocial support;Therapeutic Activities OT Self Feeding Anticipated Outcome(s): independent OT Basic Self-Care Anticipated Outcome(s): supervision OT Toileting Anticipated Outcome(s): supervision OT Bathroom Transfers Anticipated Outcome(s): supervision OT Recommendation Recommendations for Other Services: Speech consult Patient destination: Home Follow Up Recommendations: Home health OT Equipment Recommended: To be determined   Skilled Therapeutic Intervention Session 1: Pt greeted in wheelchair at time of session, agreeable to intervention for ADL session. Pt set up at shower bench and performed sit to stand with Min A with use of grab bar and pushing up from wheelchair, SPT with pivoting BLEs to sit on tub bench with Min/Mod A to guide hips and for verbal/tactile cues for safety. UB bathing with CGA after set up, LB bathing with Min A for feet and buttocks. After drying, SPT back to wheelchair with Min/Mod A with use of grab bar and able to pivot on feet during turn. Pt set up at toilet, SPT chair <> toilet with Min A with pt able to pivot on feet with extended time, Mod A to doff LB clothing. After voiding, Mod A to don new  brief and pants over hips. Ub dressing with CGA after set up with instructions for hemidressing techniques. Therapist donned new TED hose for the patient, which he did not wear at home, and Max A for donning/doffing socks. Pt set up at sink level for oral hygiene, pt independent with task. Cues provided throughout the session for proper hand/foot placement, energy conservation, and techniques to improve safety. Pt up in wheelchair alarm on, call bell in reach, all needs met.   Session 2: After lunch, pt greeted up in wheelchair agreeable to OT session. Pt brought to gym in order to participate in [facility] testing 9 hole peg which he scored average of 47 seconds for L and 1:28 for R, indicating a deficit. Pt engaged in [facility] activities in sitting and standing for crossing midline, reaching out of BOS, and utilizing tripod pinch in R hand to improve self care skills.   OT Evaluation Precautions/Restrictions  Precautions Precautions: Fall Precaution Comments: Slight R lateral lean, RUE weakness Restrictions Weight Bearing Restrictions: No General   Vital Signs Therapy Vitals Temp: 98.2 F (36.8 C) Temp Source: Oral Pulse Rate: 62 Resp: 18 BP: 130/72 Patient Position (if appropriate): Sitting Oxygen Therapy SpO2: 100 % O2 Device: Room Air Pain Pain Assessment Pain Scale: 0-10 Pain Score: 0-No pain Home Living/Prior Functioning Home Living Family/patient expects to be discharged to:: Private residence Living Arrangements: Spouse/significant other Available Help at Discharge: Family, Available 24 hours/day Type of Home: House Home Access: Stairs to enter Technical brewer of Steps: 3 Entrance Stairs-Rails: Left Home Layout: One level Bathroom Shower/Tub: Multimedia programmer: Handicapped height Bathroom Accessibility: Yes  Lives With: Spouse IADL History Leisure and Hobbies: gardening, fishing, yardwork Prior Function Level of Independence: Requires assistive device  for independence  Able to Take Stairs?: Yes Driving: No Vocation: Retired ADL  Vision Baseline Vision/History: No visual deficits(reading glasses) Wears Glasses: Reading only Patient Visual Report: No change from baseline Vision Assessment?: No apparent visual deficits Tracking/Visual Pursuits: Able to track stimulus in all quads without difficulty Visual Fields: No apparent deficits Perception  Perception: Within Functional Limits Praxis Praxis: Impaired Praxis Impairment Details: Motor planning Cognition Overall Cognitive Status: History of cognitive impairments - at baseline(pt receiving SLP services during prior admission) Arousal/Alertness: Awake/alert Orientation Level: Person;Situation Person: Oriented Year: 2021 Month: May Day of Week: Incorrect(stated Wednesday) Memory: Impaired(slightly impaired, decreased long term recall) Memory Impairment: Storage deficit;Decreased recall of new information Immediate Memory Recall: Sock;Blue;Bed Memory Recall Sock: Without Cue Memory Recall Blue: Without Cue Memory Recall Bed: With Cue Attention: Focused;Sustained Focused Attention: Appears intact Sustained Attention: Appears intact Awareness: Impaired Problem Solving: Impaired Safety/Judgment: Impaired Sensation Sensation Light Touch: Appears Intact Proprioception: Impaired by gross assessment Coordination Gross Motor Movements are Fluid and Coordinated: No Fine Motor Movements are Fluid and Coordinated: No Coordination and Movement Description: impaired FM and GM coordination in RUE 9 Hole Peg Test: 1:28 for RUE and :47 for LUE Motor  Motor Motor: Hemiplegia Motor - Skilled Clinical Observations: decreased RUE [place] Mobility     Trunk/Postural Assessment  Postural Control Postural Control: Deficits on evaluation Trunk Control: decreased sitting balance and forward weight shifting  Balance Static Sitting Balance Static Sitting - Balance Support: Feet  supported Static Sitting - Level of Assistance: 5: Stand by assistance Dynamic Sitting Balance Dynamic Sitting - Balance Support: During functional activity Dynamic Sitting - Level of Assistance: 5: Stand by assistance Static Standing Balance Static Standing - Balance Support: During functional activity;Right upper extremity supported Static Standing - Level of Assistance: 4: Min assist Dynamic Standing Balance Dynamic Standing - Level of Assistance: 3: Mod assist Dynamic Standing - Balance Activities: Lateral lean/weight shifting;Forward lean/weight shifting;Reaching for objects;Reaching across midline Extremity/Trunk Assessment RUE Assessment RUE Assessment: Exceptions to [facility] Active Range of Motion (AROM) Comments: decreased R shoulder flexion, abduction General Strength Comments: decreased strength in RUE RUE Body System: Neuro LUE Assessment LUE Assessment: Within Functional Limits General Strength Comments: 4 to 4+/5     Refer to Care Plan for Long Term Goals  Recommendations for other services: Other: speech therapy d/t reports of cognitive deficit   Discharge Criteria: Patient will be discharged from OT if patient refuses treatment 3 consecutive times without medical reason, if treatment goals not met, if there is a change in medical status, if patient makes no progress towards goals or if patient is discharged from hospital.  The above assessment, treatment plan, treatment alternatives and goals were discussed and mutually agreed upon: by patient  Viona Gilmore 07/19/2019, 3:52 PM

## 2019-07-20 ENCOUNTER — Inpatient Hospital Stay (HOSPITAL_COMMUNITY): Payer: Medicare HMO | Admitting: Speech Pathology

## 2019-07-20 ENCOUNTER — Inpatient Hospital Stay (HOSPITAL_COMMUNITY): Payer: Medicare HMO | Admitting: Occupational Therapy

## 2019-07-20 ENCOUNTER — Inpatient Hospital Stay (HOSPITAL_COMMUNITY): Payer: Medicare HMO | Admitting: Physical Therapy

## 2019-07-20 LAB — GLUCOSE, CAPILLARY
Glucose-Capillary: 120 mg/dL — ABNORMAL HIGH (ref 70–99)
Glucose-Capillary: 139 mg/dL — ABNORMAL HIGH (ref 70–99)
Glucose-Capillary: 95 mg/dL (ref 70–99)
Glucose-Capillary: 150 mg/dL — ABNORMAL HIGH (ref 70–99)

## 2019-07-20 NOTE — Progress Notes (Signed)
Patient ID: Justin [name], male   DOB: [birth year], 79 y.o.   MRN: [id number]  Sw followed up with family/patient for any questions/concerns we transition into weekend, no concerns

## 2019-07-20 NOTE — Progress Notes (Signed)
Occupational Therapy Session Note  Patient Details  Name: Justin [name] MRN: [id number] Date of Birth: 15-Dec-1940  Today's Date: 07/20/2019 OT Individual Time: [id number] OT Individual Time Calculation (min): 57 min    Short Term Goals: Week 1:  OT Short Term Goal 1 (Week 1): Pt will complete toileting all aspects with Min A for transfer, clothing management, and hygiene. OT Short Term Goal 2 (Week 1): Pt will complete LB dressing with sit to stand with Min A OT Short Term Goal 3 (Week 1): Pt will complete UB/LB bathing with tub bench with CGA using AE as needed OT Short Term Goal 4 (Week 1): Pt will perform functional transfers to various surfaces with CGA OT Short Term Goal 5 (Week 1): Pt will improve [place] in RUE as evidenced by 15 second improvement on 9 hole peg  Skilled Therapeutic Interventions/Progress Updates:    Pt was in bed to begin session, requiring mod assist and mod instructional cueing for technique to roll to the left side and then transition to sitting.  Mod assist for stand pivot transfer was needed secondary to right lean and decreased efficiency with advancing the LLE.  Noted pt with bladder incontinence as well with therapist asking him if he needed to go to the bathroom before getting out of the bed and he stated "I just peed".  He worked on standing at the sink in order to wash peri area and change brief.  Mod assist again was needed for standing balance and for washing peri area.  He was able to donn a new brief and his shorts over his feet with supervision, but needed mod assist for standing to pull them up over his hips.  For rest of session, he was transported down to the therapy gym where he worked on standing balance and RUE coordination.  Had him stand while reaching to grasp clothespins with both the left and right hands, and then placing them on the horizontal bars with just the right.  Increased right lean with right knee flexion noted in standing, requiring mod  assist to correct.  Increased trunk flexion was noted in standing and sitting as well with pt slowly falling into flexion and needing max instructional cueing with mod assist to correct in sitting and standing.  He was only able to tolerate standing for approximately two minutes secondary to fatigue and reported right knee pain.  Finished session with return to the room and pt left in the wheelchair with call button and phone in reach.    Therapy Documentation Precautions:  Precautions Precautions: Fall Precaution Comments: Slight R lateral lean, RUE weakness Restrictions Weight Bearing Restrictions: No  Pain: Pain Assessment Pain Scale: Faces Pain Score: 0-No pain Faces Pain Scale: Hurts a little bit Pain Type: Acute pain Pain Location: Knee Pain Orientation: Right Pain Descriptors / Indicators: Discomfort Pain Onset: With Activity Pain Intervention(s): Repositioned ADL: See Care Tool Section for some details of mobility and selfcare  Therapy/Group: Individual Therapy  Mcclellan Demarais OTR/L 07/20/2019, 11:59 AM

## 2019-07-20 NOTE — Progress Notes (Signed)
[facility] PHYSICAL MEDICINE & REHABILITATION PROGRESS NOTE   Subjective/Complaints:  PT requesting timed toileting  Pt working with OT   ROS- neg CP, SOB, N/V/D  Objective:   No results found. Recent Labs    07/19/19 0543  WBC 11.1*  HGB 13.8  HCT 41.7  PLT 254   Recent Labs    07/19/19 0543  NA 138  K 4.0  CL 104  CO2 26  GLUCOSE 107*  BUN 12  CREATININE 0.88  CALCIUM 8.7*    Intake/Output Summary (Last 24 hours) at 07/20/2019 0859 Last data filed at 07/20/2019 0721 Gross per 24 hour  Intake 360 ml  Output --  Net 360 ml     Physical Exam: Vital Signs Blood pressure (!) 160/78, pulse (!) 56, temperature 98 F (36.7 C), resp. rate 18, height 6' (1.829 m), weight 96.7 kg, SpO2 100 %.    General: No acute distress Mood and affect are appropriate Heart: Regular rate and rhythm no rubs murmurs or extra sounds Lungs: Clear to auscultation, breathing unlabored, no rales or wheezes Abdomen: Positive bowel sounds, soft nontender to palpation, nondistended Extremities: No clubbing, cyanosis, or edema Skin: No evidence of breakdown, no evidence of rash   Neurologic: Cranial nerves II through XII intact, motor strength is 5/5 in bilateral deltoid, bicep, tricep, grip, 3- right and 4/5 left hip flexor, knee extensors, ankle dorsiflexor and plantar flexor Sensory exam normal sensation to light touch and proprioception in bilateral upper and lower extremities  Musculoskeletal: decreased AROM RLE. No joint swelling    Assessment/Plan: 1. Functional deficits secondary to acute left corona radiata, centrum semiovale which require 3+ hours per day of interdisciplinary therapy in a comprehensive inpatient rehab setting.  Physiatrist is providing close team supervision and 24 hour management of active medical problems listed below.  Physiatrist and rehab team continue to assess barriers to discharge/monitor patient progress toward functional and medical goals  Care  Tool:  Bathing    Body parts bathed by patient: Right arm, Left arm, Chest, Abdomen, Right upper leg, Front perineal area, Face, Left upper leg   Body parts bathed by helper: Buttocks, Right lower leg, Left lower leg     Bathing assist Assist Level: Moderate Assistance - Patient 50 - 74%     Upper Body Dressing/Undressing Upper body dressing   What is the patient wearing?: Pull over shirt    Upper body assist Assist Level: Contact Guard/Touching assist    Lower Body Dressing/Undressing Lower body dressing      What is the patient wearing?: Pants, Incontinence brief     Lower body assist Assist for lower body dressing: Moderate Assistance - Patient 50 - 74%     Toileting Toileting    Toileting assist Assist for toileting: Minimal Assistance - Patient > 75%     Transfers Chair/bed transfer  Transfers assist     Chair/bed transfer assist level: Moderate Assistance - Patient 50 - 74% Chair/bed transfer assistive device: Programmer, multimedia   Ambulation assist      Assist level: Moderate Assistance - Patient 50 - 74% Assistive device: Walker-rolling Max distance: 70ft   Walk 10 feet activity   Assist     Assist level: Moderate Assistance - Patient - 50 - 74% Assistive device: Walker-rolling   Walk 50 feet activity   Assist Walk 50 feet with 2 turns activity did not occur: Safety/medical concerns         Walk 150 feet activity  Assist Walk 150 feet activity did not occur: Safety/medical concerns         Walk 10 feet on uneven surface  activity   Assist Walk 10 feet on uneven surfaces activity did not occur: Safety/medical concerns         Wheelchair     Assist Will patient use wheelchair at discharge?: (TBD)             Wheelchair 50 feet with 2 turns activity    Assist            Wheelchair 150 feet activity     Assist          Blood pressure (!) 160/78, pulse (!) 56, temperature 98  F (36.7 C), resp. rate 18, height 6' (1.829 m), weight 96.7 kg, SpO2 100 %.  Medical Problem List and Plan: 1.  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  Antithrombotics: -DVT/anticoagulation: Lovenox             -antiplatelet therapy: Aspirin 325 mg daily and Plavix 75 mg daily x3 months then Plavix alone 3. Pain Management: Tylenol as needed 4. Mood: Zoloft 25 mg daily             -antipsychotic agents: N/A 5. Neuropsych: This patient is capable of making decisions on his own behalf. 6. Skin/Wound Care: Routine skin checks 7. Fluids/Electrolytes/Nutrition: Routine in and outs.  CMP ordered for tomorrow. 8.  Permissive hypertension.  Monitor with increased mobility.  Patient on Norvasc 5 mg daily, Lasix 20 mg daily, HCTZ 25 mg daily, Cozaar 100 mg daily prior to admission.  Resume as needed          Vitals:   07/19/19 2105 07/20/19 0503  BP:  (!) 160/78  Pulse: 64 (!) 56  Resp: 18 18  Temp:  98 F (36.7 C)  SpO2: 100% 100%  Elevated BP still permissive this week, will tighten control next week  10.  Diabetes mellitus with hyperglycemia.  Latest hemoglobin A1c 7.2.  SSI.  Patient on Glucotrol 2.5 mg and Glucophage 500 mg twice daily and Ozempic 0.25 mg weekly prior to admission.  Resume as needed CBG (last 3)  Recent Labs    07/19/19 1618 07/19/19 2056 07/20/19 0621  GLUCAP 116* 130* 120*   Controlled 5/28 11.  Hyperlipidemia.  Lipitor/fenofibrate 12.  OSA.  Continue CPAP    LOS: 2 days A FACE TO FACE EVALUATION WAS PERFORMED  Charlett Blake 07/20/2019, 8:59 AM

## 2019-07-20 NOTE — Progress Notes (Signed)
Occupational Therapy Session Note  Patient Details  Name: Justin [name] MRN: [id number] Date of Birth: [birth year]  Today's Date: 07/20/2019 OT Individual Time: 1500-1530 OT Individual Time Calculation (min): 30 min    Short Term Goals: Week 1:  OT Short Term Goal 1 (Week 1): Pt will complete toileting all aspects with Min A for transfer, clothing management, and hygiene. OT Short Term Goal 2 (Week 1): Pt will complete LB dressing with sit to stand with Min A OT Short Term Goal 3 (Week 1): Pt will complete UB/LB bathing with tub bench with CGA using AE as needed OT Short Term Goal 4 (Week 1): Pt will perform functional transfers to various surfaces with CGA OT Short Term Goal 5 (Week 1): Pt will improve [place] in RUE as evidenced by 15 second improvement on 9 hole peg  Skilled Therapeutic Interventions/Progress Updates:  Patient met seated on commode in agreement with OT treatment session with focus on self-care re-education and functional transfers. Patient voided bladder and completed hygiene/clothing management with Min A. Mod A for transfer from [facility] over toilet to wc with use of RW. Patient initiated wc mobility to dayroom requiring cueing for sequencing and repeat reminders after education. Stand-pivot transfer x2 trials wc <> mat table with cueing for clearance of RLE. Session concluded with patient seated in wc with call bell within reach, belt alarm activated, and all needs met. Wife present at bedside.   Therapy Documentation Precautions:  Precautions Precautions: Fall Precaution Comments: Slight R lateral lean, RUE weakness Restrictions Weight Bearing Restrictions: No General:    Therapy/Group: Individual Therapy  [name] 07/20/2019, 3:39 PM

## 2019-07-20 NOTE — Progress Notes (Signed)
Physical Therapy Session Note  Patient Details  Name: Justin [name] MRN: [id number] Date of Birth: 08/19/1940  Today's Date: 07/20/2019 PT Individual Time: [id number] PT Individual Time Calculation (min): 56 min   Short Term Goals: Week 1:  PT Short Term Goal 1 (Week 1): Pt will perform supine<>sit with CGA PT Short Term Goal 2 (Week 1): Pt will perform sit<>stands using LRAD with CGA PT Short Term Goal 3 (Week 1): Pt will perform stand pivot transfers using LRAD with min assist PT Short Term Goal 4 (Week 1): Pt will ambulate at least 74ft using LRAD with min assist PT Short Term Goal 5 (Week 1): Pt will initiate stair navigation  Skilled Therapeutic Interventions/Progress Updates:   Pt received sitting in w/c with his wife and RN present as pt had been incontinent of bladder. Pt agreeable to therapy session and therapist assumed care of pt. Sit>stand w/c>RW with min assist for balance, cuing for proper UE placement. Standing using RW with min assist for balance due to R trunk lean while +2 assist performed total assist LB clothing management and peri-care for time management.  Transported to/from gym in w/c for time management and energy conservation. Therapist had noted slight redness on pt's bottom (skin intact) therefore provided pt with Roho cushion for increased pressure relief.  Sit<>stands using RW with min assist during session - continued cuing for R foot placement prior to standing and proper UE placement with AD. Gait training 73ft using RW with mod assist for balance - demonstrating slight improvement in gait mechanics but continues to have significantly impaired R LE foot clearance (lack of hip/knee flexion) and impaired step length - requires max repeated cuing for improvement as well as lack of R knee stability during stance (though improving from yesterday). Sit>supine on mat with supervision and cuing for R LE management.  Performed the following R LE NMR on mat: - supine bridging  2x10 reps with pt demonstrating R knee and hips falling out to the side; cuing and manual facilitation for improvement - supine hooklying hip adduction ball squeezes with bridging x10 reps requiring max cuing to avoid knees/hips falling to R - L sidelying R LE reverse clamshells x 10 reps - R LE heel slides x 10 reps - supine straight leg bridges with feet on theraball targeting increased core/hip stability x10 reps  Supine>sitting R EOB with mod assist for trunk upright. Repeated sit<>stands with B HHA to increase anterior trunk weight shift 2x5 reps with min assist for lifting/lowering - cuing for full upright stance with gluteal and R quad muscle activation. Standing R LE NMR targeting sustained activation of hip/knee extensor muscles with L LE on 4" step while completing overhead reaching task - min assist for balance and mirror feedback and cuing for R quad activation.  R stand pivot to w/c using RW with min assist, cuing for increased R lateral step length. Transported back to room in w/c. Stand pivot to toilet using grab bar with min assist. Pt left seated on toilet with his wife present for superversion and Janett Billow, NT notified.  Pt incontinent of bladder 2 additional times during therapy session.   Therapy Documentation Precautions:  Precautions Precautions: Fall Precaution Comments: Slight R lateral lean, RUE weakness Restrictions Weight Bearing Restrictions: No  Pain: Denies pain during session.   Therapy/Group: Individual Therapy  Tawana Scale, PT, DPT 07/20/2019, 3:18 PM

## 2019-07-21 ENCOUNTER — Inpatient Hospital Stay (HOSPITAL_COMMUNITY): Payer: Medicare HMO

## 2019-07-21 ENCOUNTER — Inpatient Hospital Stay (HOSPITAL_COMMUNITY): Payer: Medicare HMO | Admitting: Physical Therapy

## 2019-07-21 ENCOUNTER — Inpatient Hospital Stay (HOSPITAL_COMMUNITY): Payer: Medicare HMO | Admitting: Occupational Therapy

## 2019-07-21 LAB — GLUCOSE, CAPILLARY
Glucose-Capillary: 113 mg/dL — ABNORMAL HIGH (ref 70–99)
Glucose-Capillary: 112 mg/dL — ABNORMAL HIGH (ref 70–99)
Glucose-Capillary: 125 mg/dL — ABNORMAL HIGH (ref 70–99)
Glucose-Capillary: 191 mg/dL — ABNORMAL HIGH (ref 70–99)

## 2019-07-21 NOTE — Progress Notes (Signed)
Physical Therapy Session Note  Patient Details  Name: Justin [name] MRN: [id number] Date of Birth: 1940/11/29  Today's Date: 07/21/2019 PT Individual Time: [id number] PT Individual Time Calculation (min): 69 min   Short Term Goals: Week 1:  PT Short Term Goal 1 (Week 1): Pt will perform supine<>sit with CGA PT Short Term Goal 2 (Week 1): Pt will perform sit<>stands using LRAD with CGA PT Short Term Goal 3 (Week 1): Pt will perform stand pivot transfers using LRAD with min assist PT Short Term Goal 4 (Week 1): Pt will ambulate at least 58ft using LRAD with min assist PT Short Term Goal 5 (Week 1): Pt will initiate stair navigation  Skilled Therapeutic Interventions/Progress Updates:    Pt received sitting in w/c with NT present for vital sign assessment and pt's wife present. Pt agreeable to therapy session.  Transported to/from gym in w/c for time management and energy conservation. Reinforced education on importance of increasing R LE step length and foot clearance while ambulating. Gait training using RW ~50ft with mod assist and pt continuing to demonstrate significant R LE foot clearance and step length impairments as well as impaired stance control stability with 1x minor R knee buckle. Repeated sit<>stands to/from slightly elevated EOM 2x8 reps with B HHA to promote increased anterior weight shift then hip/knee extension in stance with mirror feedback - CGA/min assist for balance. R LE NMR targeting improved stance control via L LE on 4" step while performing R lateral reaching task to place clothespins on basketball goal - multimodal cuing not to use mat behind him for stability, increased hip extension and upright posture, decreased posterior lean and to avoid R knee hyperextension. Performed seated R LE hamstring curls against level 2 theraband resistance 2x10 reps targeting hamstring strength. Sit>supine with supervision on mat. Supine bridging for R LE NMR and glute strengthening 2x15 reps  - cuing for increased hip extension and muscle activation - demonstrates improved control with R hip not falling out to the side but instead it coming inwards therefore performed 1x10reps with level 2 orange theraband around knees for hip abductor activation. Supine>sitting L EOM with min/mod assist for trunk upright from this flat surface. Standing with RW, R LE NMR targeting swing phase gait mechanics via foot taps on 6" step focusing on increased knee flexion prior to initiating hip flexion 2x10reps. Pt noted to be incontinent and brief falling down. R stand pivot to w/c using RW with CGA for steadying and sequential cuing for AD management and LE stepping. Transported back to room. Stand pivot w/c<>toilet using grab bar with CGA for steadying and total assist brief management. Sit<>stand to/from toilet using grab bars with CGA. Unable to void more on toilet. Seated hand hygiene at sink. NT present for blood sugar checks. Pt left seated in w/c with needs in reach and seat belt alarm on.  Therapy Documentation Precautions:  Precautions Precautions: Fall Precaution Comments: Slight R lateral lean, RUE weakness Restrictions Weight Bearing Restrictions: No  Pain: Denies pain during session.   Therapy/Group: Individual Therapy  Tawana Scale, PT, DPT 07/21/2019, 3:25 PM

## 2019-07-21 NOTE — Plan of Care (Signed)
  Problem: RH BLADDER ELIMINATION Goal: RH STG MANAGE BLADDER WITH ASSISTANCE Description: STG Manage Bladder With Min Assistance Outcome: Not Progressing; time toileting

## 2019-07-21 NOTE — Plan of Care (Signed)
  Problem: RH BLADDER ELIMINATION Goal: RH STG MANAGE BLADDER WITH ASSISTANCE Description: STG Manage Bladder With Min Assistance Outcome: Progressing Goal: RH STG MANAGE BLADDER WITH EQUIPMENT WITH ASSISTANCE Description: STG Manage Bladder With Equipment With Min Assistance Outcome: Progressing   Problem: RH SKIN INTEGRITY Goal: RH STG SKIN FREE OF INFECTION/BREAKDOWN Description: No new breakdown with min assist  Outcome: Progressing   Problem: RH SAFETY Goal: RH STG ADHERE TO SAFETY PRECAUTIONS W/ASSISTANCE/DEVICE Description: STG Adhere to Safety Precautions With Min Assistance/Device. Outcome: Progressing   

## 2019-07-21 NOTE — Progress Notes (Signed)
Occupational Therapy Session Note  Patient Details  Name: Justin [name] MRN: [id number] Date of Birth: January 03, 1941  Today's Date: 07/21/2019 OT Individual Time: 1050-1201 OT Individual Time Calculation (min): 71 min    Short Term Goals: Week 1:  OT Short Term Goal 1 (Week 1): Pt will complete toileting all aspects with Min A for transfer, clothing management, and hygiene. OT Short Term Goal 2 (Week 1): Pt will complete LB dressing with sit to stand with Min A OT Short Term Goal 3 (Week 1): Pt will complete UB/LB bathing with tub bench with CGA using AE as needed OT Short Term Goal 4 (Week 1): Pt will perform functional transfers to various surfaces with CGA OT Short Term Goal 5 (Week 1): Pt will improve [place] in RUE as evidenced by 15 second improvement on 9 hole peg  Skilled Therapeutic Interventions/Progress Updates:    Pt completed bathing and dressing to start session.  He needed mod assist for functional mobility to the bathroom with use of the RW for support.  Noted pt with decreased ability to advance the RLE with increased lean to the right as well.  Once in the bathroom, he was able to transfer to the tub bench and then undress with min assist sit to stand and with use of the grab bar and walker for support.  He completed all bathing with min assist sit to stand and min instructional cueing for thoroughness.  Once shower was complete, he ambulated out to the wheelchair at the sink where he completed shaving with min assist for thoroughness and combing his hair with setup only.  He was able to complete UB dressing with supervision and LB dressing with mod assist.  Decreased ability to donn his gripper socks over the RLE.  Next, he was taken down to the dayroom where he transferred to the therapy mat with mod assist stand pivot using the RW for support.  He then worked on dynamic standing balance and pre-gait stepping forward with the RLE in order to increase left weightshift and right step  length for transfers.  Mod assist was needed to help maintain balance and facilitate weightshift to the left.  He completed good step length forward when given support, but demonstrates increased trunk flexion and right lean when attempting to bring the LE backwards to the standing position.  Finished session with return to the room and pt maintaining sitting up in the wheelchair.  Call bell in reach with safety belt in place.    Therapy Documentation Precautions:  Precautions Precautions: Fall Precaution Comments: Slight R lateral lean, RUE weakness Restrictions Weight Bearing Restrictions: No  Pain: Pain Assessment Pain Scale: Faces Pain Score: 0-No pain ADL: See Care Tool Section for some details of ADL performance  Therapy/Group: Individual Therapy  Fantasia Jinkins OTR/L 07/21/2019, 2:49 PM

## 2019-07-22 ENCOUNTER — Inpatient Hospital Stay (HOSPITAL_COMMUNITY): Payer: Medicare HMO

## 2019-07-22 LAB — GLUCOSE, CAPILLARY
Glucose-Capillary: 99 mg/dL (ref 70–99)
Glucose-Capillary: 201 mg/dL — ABNORMAL HIGH (ref 70–99)
Glucose-Capillary: 143 mg/dL — ABNORMAL HIGH (ref 70–99)
Glucose-Capillary: 179 mg/dL — ABNORMAL HIGH (ref 70–99)

## 2019-07-22 NOTE — Progress Notes (Signed)
[place] PHYSICAL MEDICINE & REHABILITATION PROGRESS NOTE   Subjective/Complaints: No issues overnite   ROS- neg CP, SOB, N/V/D  Objective:   No results found. No results for input(s): WBC, HGB, HCT, PLT in the last 72 hours. No results for input(s): NA, K, CL, CO2, GLUCOSE, BUN, CREATININE, CALCIUM in the last 72 hours.  Intake/Output Summary (Last 24 hours) at 07/22/2019 0913 Last data filed at 07/22/2019 0700 Gross per 24 hour  Intake 240 ml  Output 250 ml  Net -10 ml     Physical Exam: Vital Signs Blood pressure (!) 154/78, pulse (!) 58, temperature 97.6 F (36.4 C), resp. rate 18, height 6' (1.829 m), weight 96.4 kg, SpO2 100 %.  General: No acute distress Mood and affect are appropriate Heart: Regular rate and rhythm no rubs murmurs or extra sounds Lungs: Clear to auscultation, breathing unlabored, no rales or wheezes Abdomen: Positive bowel sounds, soft nontender to palpation, nondistended Extremities: No clubbing, cyanosis, or edema Skin: No evidence of breakdown, no evidence of rash Neurologic: Cranial nerves II through XII intact, motor strength is 5/5 in bilateral deltoid, bicep, tricep, grip, 3- right and 4/5 left hip flexor, knee extensors, ankle dorsiflexor and plantar flexor Sensory exam normal sensation to light touch and proprioception in bilateral upper and lower extremities  Musculoskeletal: decreased AROM RLE. No joint swelling    Assessment/Plan: 1. Functional deficits secondary to acute left corona radiata, centrum semiovale which require 3+ hours per day of interdisciplinary therapy in a comprehensive inpatient rehab setting.  Physiatrist is providing close team supervision and 24 hour management of active medical problems listed below.  Physiatrist and rehab team continue to assess barriers to discharge/monitor patient progress toward functional and medical goals  Care Tool:  Bathing    Body parts bathed by patient: Right arm, Left arm,  Chest, Abdomen, Front perineal area, Buttocks, Right upper leg, Left upper leg, Right lower leg, Left lower leg, Face   Body parts bathed by helper: Buttocks, Right lower leg, Left lower leg Body parts n/a: Left lower leg, Right lower leg, Left upper leg, Right upper leg, Abdomen, Chest, Left arm, Right arm, Face(did not attempt this session)   Bathing assist Assist Level: Minimal Assistance - Patient > 75%     Upper Body Dressing/Undressing Upper body dressing   What is the patient wearing?: Pull over shirt    Upper body assist Assist Level: Supervision/Verbal cueing    Lower Body Dressing/Undressing Lower body dressing      What is the patient wearing?: Pants, Incontinence brief     Lower body assist Assist for lower body dressing: Moderate Assistance - Patient 50 - 74%     Toileting Toileting    Toileting assist Assist for toileting: Moderate Assistance - Patient 50 - 74%     Transfers Chair/bed transfer  Transfers assist     Chair/bed transfer assist level: Minimal Assistance - Patient > 75% Chair/bed transfer assistive device: Programmer, multimedia   Ambulation assist      Assist level: Moderate Assistance - Patient 50 - 74% Assistive device: Walker-rolling Max distance: 84ft   Walk 10 feet activity   Assist     Assist level: Moderate Assistance - Patient - 50 - 74% Assistive device: Walker-rolling   Walk 50 feet activity   Assist Walk 50 feet with 2 turns activity did not occur: Safety/medical concerns         Walk 150 feet activity   Assist Walk 150 feet activity  did not occur: Safety/medical concerns         Walk 10 feet on uneven surface  activity   Assist Walk 10 feet on uneven surfaces activity did not occur: Safety/medical concerns         Wheelchair     Assist Will patient use wheelchair at discharge?: No(No PT long term goals set)             Wheelchair 50 feet with 2 turns  activity    Assist            Wheelchair 150 feet activity     Assist          Blood pressure (!) 154/78, pulse (!) 58, temperature 97.6 F (36.4 C), resp. rate 18, height 6' (1.829 m), weight 96.4 kg, SpO2 100 %.  Medical Problem List and Plan: 1.  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  Antithrombotics: -DVT/anticoagulation: Lovenox             -antiplatelet therapy: Aspirin 325 mg daily and Plavix 75 mg daily x3 months then Plavix alone 3. Pain Management: Tylenol as needed 4. Mood: Zoloft 25 mg daily             -antipsychotic agents: N/A 5. Neuropsych: This patient is capable of making decisions on his own behalf. 6. Skin/Wound Care: Routine skin checks 7. Fluids/Electrolytes/Nutrition: Routine in and outs.  CMP ordered for tomorrow. 8.  Permissive hypertension.  Monitor with increased mobility.  Patient on Norvasc 5 mg daily, Lasix 20 mg daily, HCTZ 25 mg daily, Cozaar 100 mg daily prior to admission.  Resume as needed          Vitals:   07/21/19 1940 07/22/19 0444  BP: 138/71 (!) 154/78  Pulse: 64 (!) 58  Resp: 16 18  Temp: 97.6 F (36.4 C) 97.6 F (36.4 C)  SpO2: 100% 100%  Elevated BP still permissive this week, will tighten control next week  10.  Diabetes mellitus with hyperglycemia.  Latest hemoglobin A1c 7.2.  SSI.  Patient on Glucotrol 2.5 mg and Glucophage 500 mg twice daily and Ozempic 0.25 mg weekly prior to admission.  Resume as needed CBG (last 3)  Recent Labs    07/21/19 1731 07/21/19 2055 07/22/19 0611  GLUCAP 125* 191* 99   Controlled 5/30, some lability in pm, ?diet related  11.   Hyperlipidemia.  Lipitor/fenofibrate 12.  OSA.  Continue CPAP    LOS: 4 days A FACE TO FACE EVALUATION WAS PERFORMED  Charlett Blake 07/22/2019, 9:13 AM

## 2019-07-22 NOTE — Plan of Care (Signed)
  Problem: RH SAFETY Goal: RH STG ADHERE TO SAFETY PRECAUTIONS W/ASSISTANCE/DEVICE Description: STG Adhere to Safety Precautions With Min Assistance/Device. Outcome: Progressing   Problem: RH PAIN MANAGEMENT Goal: RH STG PAIN MANAGED AT OR BELOW PT'S PAIN GOAL Description: < 4 out of 10.  Outcome: Progressing

## 2019-07-22 NOTE — Progress Notes (Signed)
Physical Therapy Session Note  Patient Details  Name: Justin [name] MRN: [id number] Date of Birth: Nov 28, 1940  Today's Date: 07/22/2019 PT Individual Time: [id number] PT Individual Time Calculation (min): 57 min    Short Term Goals: Week 1:  PT Short Term Goal 1 (Week 1): Pt will perform supine<>sit with CGA PT Short Term Goal 2 (Week 1): Pt will perform sit<>stands using LRAD with CGA PT Short Term Goal 3 (Week 1): Pt will perform stand pivot transfers using LRAD with min assist PT Short Term Goal 4 (Week 1): Pt will ambulate at least 45ft using LRAD with min assist PT Short Term Goal 5 (Week 1): Pt will initiate stair navigation  Skilled Therapeutic Interventions/Progress Updates:    Pt seated in w/c upon PT arrival, agreeable to therapy tx and denies pain. Therapist assisted to don teds and shoes total assist. Pt transported to the gym in w/c. Pt transferred to the mat with RW and min assist, cues for R LE placement/clearance. Pt performed 2 x 10 toe taps on aerobic step using R LE while standing with RW working on hip flexor strength and foot clearance. Pt transferred to supine with assist for R LE management to lift onto mat. Pt participated in LE therex this morning to perform the following exercises for strengthening, 2 x 10 of each - bridges, R LE heel slides, and sidelying clamshells. Pt transferred back to sitting with min assist, cues to roll into sidelying and then push up to sit. Pt performed 2 x 8 sit<>stands this session with B HHA working on strengthening and NMR, in standing cues for midline (to limit R lean) and cues for full extension of R hip/knee. Pt ambulated 2 x 25 ft this session with RW and min-mod assist, cues for R step length and foot clearance. Pt worked on R LE strength and NMR this session while standing at counter top, performed 2 x 10 standing hamstring curls and 2 x 10 R LE sidesteps in place to target (with sidesteps cues to pick up foot rather than slide foot).  Standing at the counter pt worked on R stance control and strength while performing 2 x 10 L LE standing hip abduction, cues to maintain R knee/hip extension. Pt transported back to room and left in w/c with needs in reach and chair alarm set.   Therapy Documentation Precautions:  Precautions Precautions: Fall Precaution Comments: Slight R lateral lean, RUE weakness Restrictions Weight Bearing Restrictions: No    Therapy/Group: Individual Therapy  Netta Corrigan, PT, DPT, CSRS 07/22/2019, 10:00 AM

## 2019-07-23 ENCOUNTER — Inpatient Hospital Stay (HOSPITAL_COMMUNITY): Payer: Medicare HMO

## 2019-07-23 ENCOUNTER — Inpatient Hospital Stay (HOSPITAL_COMMUNITY): Payer: Medicare HMO | Admitting: Occupational Therapy

## 2019-07-23 ENCOUNTER — Inpatient Hospital Stay (HOSPITAL_COMMUNITY): Payer: Medicare HMO | Admitting: Physical Therapy

## 2019-07-23 DIAGNOSIS — E669 Obesity, unspecified: Secondary | ICD-10-CM

## 2019-07-23 DIAGNOSIS — I10 Essential (primary) hypertension: Secondary | ICD-10-CM

## 2019-07-23 DIAGNOSIS — E1169 Type 2 diabetes mellitus with other specified complication: Secondary | ICD-10-CM

## 2019-07-23 LAB — GLUCOSE, CAPILLARY
Glucose-Capillary: 125 mg/dL — ABNORMAL HIGH (ref 70–99)
Glucose-Capillary: 163 mg/dL — ABNORMAL HIGH (ref 70–99)
Glucose-Capillary: 118 mg/dL — ABNORMAL HIGH (ref 70–99)
Glucose-Capillary: 147 mg/dL — ABNORMAL HIGH (ref 70–99)

## 2019-07-23 MED ORDER — SEMAGLUTIDE(0.25 OR 0.5MG/DOS) 2 MG/1.5ML ~~LOC~~ SOPN
0.2500 mg | PEN_INJECTOR | SUBCUTANEOUS | Status: DC
  Administered 2019-07-23 – 2019-07-30 (×2): 0.25 mg via SUBCUTANEOUS
  Filled 2019-07-23 (×2): qty 1

## 2019-07-23 MED ORDER — GLIPIZIDE 5 MG PO TABS
2.5000 mg | ORAL_TABLET | Freq: Every day | ORAL | Status: DC
  Administered 2019-07-23 – 2019-07-24 (×2): 2.5 mg via ORAL
  Filled 2019-07-23 (×2): qty 1

## 2019-07-23 NOTE — Progress Notes (Signed)
Occupational Therapy Session Note  Patient Details  Name: Justin [name] MRN: [id number] Date of Birth: 16-Jun-1940  Today's Date: 07/23/2019 OT Individual Time: [id number] OT Individual Time Calculation (min): 57 min    Short Term Goals: Week 1:  OT Short Term Goal 1 (Week 1): Pt will complete toileting all aspects with Min A for transfer, clothing management, and hygiene. OT Short Term Goal 2 (Week 1): Pt will complete LB dressing with sit to stand with Min A OT Short Term Goal 3 (Week 1): Pt will complete UB/LB bathing with tub bench with CGA using AE as needed OT Short Term Goal 4 (Week 1): Pt will perform functional transfers to various surfaces with CGA OT Short Term Goal 5 (Week 1): Pt will improve [place] in RUE as evidenced by 15 second improvement on 9 hole peg  Skilled Therapeutic Interventions/Progress Updates:    Upon entering the room, pt coming out of the bathroom with nurse tech. Pt declined bathing tasks this session. Hand hygiene at sink with supervision and pt shaving self this session with set up A. Pt propelled wheelchair with B UEs for strengthening 4' with increased time and supervision. Pt engaged in R [facility] table top task while standing in 5 minute increments x3. Pt fatiguing and needing mod multimodal cuing for upright positioning as he fatigues. Pt returning to wheelchair and propelled self back to room in same manner. Pt remained in wheelchair with chair alarm belt donned and caregiver present in the room.   Therapy Documentation Precautions:  Precautions Precautions: Fall Precaution Comments: Slight R lateral lean, RUE weakness Restrictions Weight Bearing Restrictions: No General:   Vital Signs: Therapy Vitals Temp: (!) 97.5 F (36.4 C) Temp Source: Oral Pulse Rate: 67 Resp: 18 BP: (!) 122/57 Patient Position (if appropriate): Lying Oxygen Therapy SpO2: 100 % O2 Device: Room Air   Therapy/Group: Individual Therapy  Gypsy Decant 07/23/2019, 4:31  PM

## 2019-07-23 NOTE — Plan of Care (Signed)
  Problem: RH SAFETY Goal: RH STG ADHERE TO SAFETY PRECAUTIONS W/ASSISTANCE/DEVICE Description: STG Adhere to Safety Precautions With Min Assistance/Device. Outcome: Progressing   Problem: RH BLADDER ELIMINATION Goal: RH STG MANAGE BLADDER WITH ASSISTANCE Description: STG Manage Bladder With Min Assistance Outcome: Progressing Goal: RH STG MANAGE BLADDER WITH EQUIPMENT WITH ASSISTANCE Description: STG Manage Bladder With Equipment With Min Assistance Outcome: Progressing

## 2019-07-23 NOTE — Progress Notes (Signed)
Physical Therapy Session Note  Patient Details  Name: Justin [name] MRN: [id number] Date of Birth: 27-Aug-1940  Today's Date: 07/23/2019 PT Individual Time: 1302-1330 PT Individual Time Calculation (min): 28 min    Short Term Goals: Week 1:  PT Short Term Goal 1 (Week 1): Pt will perform supine<>sit with CGA PT Short Term Goal 2 (Week 1): Pt will perform sit<>stands using LRAD with CGA PT Short Term Goal 3 (Week 1): Pt will perform stand pivot transfers using LRAD with min assist PT Short Term Goal 4 (Week 1): Pt will ambulate at least 49ft using LRAD with min assist PT Short Term Goal 5 (Week 1): Pt will initiate stair navigation  Skilled Therapeutic Interventions/Progress Updates:    Pt seated in w/c upon PT arrival, agreeable to therapy tx and denies pain. Pt transported to the gym in w/c. Pt ambulated x 55 ft this session with RW and min assist, max cues for R step length and foot clearance. Pt standing with RW performed exercises for R LE NMR including R hip flexion 2 x 10 and R hamstring curls 2 x 10. Pt worked on standing balance this session without UE support while performing horseshoe toss activities, x 2 trials reaching back to get horseshoes off the mat and then x 2 trials reaching overhead for horseshoes off basketball hoop, CGA/min assist for balance with cues to correct R lean and for upright posture hip/knee extension. Pt ambulated x 10 ft back to w/c with RW and min assist, transported back to room and left in w/c with needs in reach and chair alarm set.   Therapy Documentation Precautions:  Precautions Precautions: Fall Precaution Comments: Slight R lateral lean, RUE weakness Restrictions Weight Bearing Restrictions: No    Therapy/Group: Individual Therapy  Netta Corrigan, PT, DPT, CSRS 07/23/2019, 8:48 AM

## 2019-07-23 NOTE — Progress Notes (Signed)
Physical Therapy Session Note  Patient Details  Name: Justin [name] MRN: [id number] Date of Birth: 1940/12/22  Today's Date: 07/23/2019 PT Individual Time: 1002-1101 and [id number] PT Individual Time Calculation (min): 59 min and 68 min  Short Term Goals: Week 1:  PT Short Term Goal 1 (Week 1): Pt will perform supine<>sit with CGA PT Short Term Goal 2 (Week 1): Pt will perform sit<>stands using LRAD with CGA PT Short Term Goal 3 (Week 1): Pt will perform stand pivot transfers using LRAD with min assist PT Short Term Goal 4 (Week 1): Pt will ambulate at least 73ft using LRAD with min assist PT Short Term Goal 5 (Week 1): Pt will initiate stair navigation  Skilled Therapeutic Interventions/Progress Updates:    Session 1: Pt received supine, asleep in bed but upon awakening agreeable to therapy session. Therapist donned B LE TED hose and socks - noticed increased swelling in feet and lower legs (R more than L). Supine>sitting L EOB, HOB elevated and using bedrails, with min assist for trunk upright. Sitting EOB, pt had posterior LOB falling backwards onto bed with delayed and insufficient righting reaction requiring assist to return upright. Sit>stand EOB>RW with min assist for lifting into standing, cuing for proper hand placement using AD. Gait ~25ft to bathroom using RW with min assist for balance and 3x assist to advance R LE during swing due to significantly impaired hip/knee flexion. Sit<>stand to/from toilet using grab bar with min assist for lifting/lowering. Pt had been incontinent of bladder in brief and voided more on toilet. Standing using grab bar with min assist for balance due to R posterior lean and pt having gradual worsening into squat/flexed posture due to LE fatigue and lack of sustained R quad activation. Unable to safely ambulate back out to room so therapist retrieved w/c and pt performed stand pivot to w/c using grab bar with min assist. Seated hand hygiene at sink. Transported  to/from gym in w/c for time management and energy conservation. Gait 67ft using RW with min assist for balance and max cuing for increased R LE hip/knee flexion to decrease foot drag on floor and increase step length, minimal improvement. Repeated sit<>stand with B HHA to promote increased anterior weight shift and increased B LE muscle activation x5 reps. Seated R LE hamstring curls against level 2 theraband resistance x10 reps. Standing with B UE support on RW and mirror feedback  performed R LE NMR focusing on swing phase to increase hip/knee flexion via foot taps on 8" step - cuing for increased R hip/knee flexion muscle activation - demonstrates significant forward flexion with lack of hip extensor muscle activation.  Sit>supine with min assist for R LE management onto mat. Supine bridging 2x10 reps with max cuing for increased glute activation and to keep pelvis level. Supine>sitting with min/mod assist for trunk upright due to posterior lean. Returned to standing R LE NMR task using RW but pt continued to have flexed trunk therefore removed AD and performed with R HHA and pt had improved upright posture - min assist for balance and max multimodal cuing with mirror feedback to sustain improved upright potsure. Gait ~32ft back to w/c using RW with min assist for balance and pt continuing to demonstrate lack of adequate R hip/knee flexion during swing despite cuing. Transported back to room and pt left seated in w/c with needs in reach and seat belt alarm on.  Session 2: Pt received sitting in w/c with his wife, [name], present and pt agreeable to  therapy session. [name] reports he was just assisted to the bathroom.  Transported to/from gym in w/c for time management and energy conservation. Sit>stands using RW with min assist for lifting progressed to CGA for steadying - cuing for R foot placement and leaning forward to come to stand. Gait training ~82ft x2 using RW with min assist for balance and AD  management - pt demonstrates improved R LE foot clearance during swing compared to this morning but still significantly impaired lacking enough hip/knee flexion to clear foot and take adequate step - also demonstrates lack of full L lateral weight shift during stance - during 2nd walk donned 7lb ankle weight to increase attention/focus on R LE to  increase hip/knee flexion during swing. Standing R LE NMR without UE support targeting improved swing phase mechanics to increase L lateral weight shift and R LE hip/knee flexion via R foot taps on 8" step wearing 7lb weight x10 reps then progressed to no weight to improve carryover - min assist for balance and repeated cuing with manual facilitation and mirror feedback for hip extension for upright posture. Gait training ~82ft using RW with min assist for balance - in beginning, demonstrates improved R LE swing phase mechanics clearing foot and increased step lengths but with fatigue regresses. R LE NMR targeting hip abductor strengthening as pt continues to lack full R lateral step length when turning to sit via R lateral foot taps on yoga block progressed to R LE stepping laterally back/forth over hockey stick all without UE support - min assist for balance and mirror feedback with manual cuing for upright posture. R LE NMR targeting stance phase control and increased hip abductor activation via L LE lateral steps over hockey stick without UE support 2x10 reps - min assist for balance and manual cuing with mirror feedback for upright posture and R LE hip/knee extension. L stand pivot to w/c, no AD, with max cuing for upright posture and R/L lateral weight shift to step with min assist for balance. Sitting>tall kneeling on mat with UE support on bench with min assist. In tall kneeling with intermittent L UE support on bench as needed performed R UE reaching task to promote increased hip/trunk extension. Tall kneeling>sitting in w/c with min assist for balance and cuing  for sequencing stepping off mat. Gait training ~72ft using RW (including a turn) with min assist for balance and continued max repeated cuing for increased R LE foot clearance and step length after a few steps though improving.Transported back to room and left seated in w/c with needs in reach and seat belt alarm on.  Therapy Documentation Precautions:  Precautions Precautions: Fall Precaution Comments: Slight R lateral lean, RUE weakness Restrictions Weight Bearing Restrictions: No  Pain:   Session 1: Denies pain during session.  Session 2: During 1st walk reports R anterior hip pain described as "bony" pain but remainder of session denies that pain or any additional pain.    Therapy/Group: Individual Therapy  Tawana Scale, PT, DPT 07/23/2019, 7:53 AM

## 2019-07-23 NOTE — Progress Notes (Signed)
[facility] PHYSICAL MEDICINE & REHABILITATION PROGRESS NOTE   Subjective/Complaints: No complaints this morning. Taking a little nap prior to therapy.   ROS: Patient denies fever, rash, sore throat, blurred vision, nausea, vomiting, diarrhea, cough, shortness of breath or chest pain, joint or back pain, headache, or mood change.    Objective:   No results found. No results for input(s): WBC, HGB, HCT, PLT in the last 72 hours. No results for input(s): NA, K, CL, CO2, GLUCOSE, BUN, CREATININE, CALCIUM in the last 72 hours.  Intake/Output Summary (Last 24 hours) at 07/23/2019 1031 Last data filed at 07/23/2019 0800 Gross per 24 hour  Intake 750 ml  Output 250 ml  Net 500 ml     Physical Exam: Vital Signs Blood pressure (!) 175/66, pulse 62, temperature 97.9 F (36.6 C), temperature source Oral, resp. rate 18, height 6' (1.829 m), weight 98.3 kg, SpO2 98 %.  Constitutional: No distress . Vital signs reviewed. HEENT: EOMI, oral membranes moist Neck: supple Cardiovascular: RRR without murmur. No JVD    Respiratory/Chest: CTA Bilaterally without wheezes or rales. Normal effort    GI/Abdomen: BS +, non-tender, non-distended Ext: no clubbing, cyanosis, or edema Psych: pleasant and cooperative Skin: No evidence of breakdown, no evidence of rash Neurologic: Cranial nerves II through XII intact, motor strength is 5/5 in bilateral deltoid, bicep, tricep, grip. 3 to 3+ right and 4/5 left hip flexor, knee extensors, ankle dorsiflexor and plantar flexor Normal sensation. Reasonable insight and awareness. Some delays in processing  Musculoskeletal: decreased AROM RLE. No joint swelling    Assessment/Plan: 1. Functional deficits secondary to acute left corona radiata, centrum semiovale which require 3+ hours per day of interdisciplinary therapy in a comprehensive inpatient rehab setting.  Physiatrist is providing close team supervision and 24 hour management of active medical problems  listed below.  Physiatrist and rehab team continue to assess barriers to discharge/monitor patient progress toward functional and medical goals  Care Tool:  Bathing    Body parts bathed by patient: Right arm, Left arm, Chest, Abdomen, Front perineal area, Buttocks, Right upper leg, Left upper leg, Right lower leg, Left lower leg, Face   Body parts bathed by helper: Buttocks, Right lower leg, Left lower leg Body parts n/a: Left lower leg, Right lower leg, Left upper leg, Right upper leg, Abdomen, Chest, Left arm, Right arm, Face(did not attempt this session)   Bathing assist Assist Level: Minimal Assistance - Patient > 75%     Upper Body Dressing/Undressing Upper body dressing   What is the patient wearing?: Pull over shirt    Upper body assist Assist Level: Supervision/Verbal cueing    Lower Body Dressing/Undressing Lower body dressing      What is the patient wearing?: Pants, Incontinence brief     Lower body assist Assist for lower body dressing: Moderate Assistance - Patient 50 - 74%     Toileting Toileting    Toileting assist Assist for toileting: Moderate Assistance - Patient 50 - 74%     Transfers Chair/bed transfer  Transfers assist     Chair/bed transfer assist level: Minimal Assistance - Patient > 75% Chair/bed transfer assistive device: Programmer, multimedia   Ambulation assist      Assist level: Minimal Assistance - Patient > 75% Assistive device: Walker-rolling Max distance: 25 ft   Walk 10 feet activity   Assist     Assist level: Minimal Assistance - Patient > 75% Assistive device: Walker-rolling   Walk 50 feet activity  Assist Walk 50 feet with 2 turns activity did not occur: Safety/medical concerns         Walk 150 feet activity   Assist Walk 150 feet activity did not occur: Safety/medical concerns         Walk 10 feet on uneven surface  activity   Assist Walk 10 feet on uneven surfaces activity did not  occur: Safety/medical concerns         Wheelchair     Assist Will patient use wheelchair at discharge?: No(No PT long term goals set)             Wheelchair 50 feet with 2 turns activity    Assist            Wheelchair 150 feet activity     Assist          Blood pressure (!) 175/66, pulse 62, temperature 97.9 F (36.6 C), temperature source Oral, resp. rate 18, height 6' (1.829 m), weight 98.3 kg, SpO2 98 %.  Medical Problem List and Plan: 1.  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  Antithrombotics: -DVT/anticoagulation: Lovenox             -antiplatelet therapy: Aspirin 325 mg daily and Plavix 75 mg daily x3 months then Plavix alone 3. Pain Management: Tylenol as needed 4. Mood: Zoloft 25 mg daily             -antipsychotic agents: N/A 5. Neuropsych: This patient is capable of making decisions on his own behalf. 6. Skin/Wound Care: Routine skin checks 7. Fluids/Electrolytes/Nutrition: Routine in and outs.  CMP ordered for tomorrow. 8.  Permissive hypertension.  Monitor with increased mobility.  Patient on Norvasc 5 mg daily, Lasix 20 mg daily, HCTZ 25 mg daily, Cozaar 100 mg daily prior to admission.  Resume as needed          Vitals:   07/22/19 1943 07/23/19 0416  BP: 136/62 (!) 175/66  Pulse: 67 62  Resp: 16 18  Temp: 98.3 F (36.8 C) 97.9 F (36.6 C)  SpO2: 100% 98%  5/31 BP's higher in AM. Consider resuming low dose agent this week. 10.  Diabetes mellitus with hyperglycemia.  Latest hemoglobin A1c 7.2.  SSI.  Patient on Glucotrol 2.5 mg and Glucophage 500 mg twice daily and Ozempic 0.25  mg weekly prior to admission.  Resume as needed CBG (last 3)  Recent Labs    07/22/19 1652 07/22/19 2057 07/23/19 0613  GLUCAP 143* 179* 125*   Controlled 5/31, some lability in pm, took 5mg  glucotrol in PM at home, will schedule with dinner, only 2.5mg  to start 11.  Hyperlipidemia.  Lipitor/fenofibrate 12.  OSA.  Continue CPAP    LOS: 5 days A FACE TO FACE EVALUATION WAS PERFORMED  [name] 07/23/2019, 10:31 AM

## 2019-07-24 ENCOUNTER — Inpatient Hospital Stay (HOSPITAL_COMMUNITY): Payer: Medicare HMO | Admitting: Occupational Therapy

## 2019-07-24 ENCOUNTER — Encounter: Payer: No Typology Code available for payment source | Admitting: Registered Nurse

## 2019-07-24 ENCOUNTER — Inpatient Hospital Stay (HOSPITAL_COMMUNITY): Payer: Medicare HMO | Admitting: Physical Therapy

## 2019-07-24 LAB — GLUCOSE, CAPILLARY
Glucose-Capillary: 95 mg/dL (ref 70–99)
Glucose-Capillary: 135 mg/dL — ABNORMAL HIGH (ref 70–99)
Glucose-Capillary: 169 mg/dL — ABNORMAL HIGH (ref 70–99)
Glucose-Capillary: 180 mg/dL — ABNORMAL HIGH (ref 70–99)

## 2019-07-24 NOTE — Progress Notes (Addendum)
[facility] PHYSICAL MEDICINE & REHABILITATION PROGRESS NOTE   Subjective/Complaints: No issues overnite , difficulty advancing RLE although strength appears sufficient at R foot/ankle  ROS: Patient denies CP, SOB, N/V/D Objective:   No results found. No results for input(s): WBC, HGB, HCT, PLT in the last 72 hours. No results for input(s): NA, K, CL, CO2, GLUCOSE, BUN, CREATININE, CALCIUM in the last 72 hours.  Intake/Output Summary (Last 24 hours) at 07/24/2019 0821 Last data filed at 07/24/2019 0726 Gross per 24 hour  Intake 658 ml  Output --  Net 658 ml     Physical Exam: Vital Signs Blood pressure (!) 170/82, pulse 64, temperature 97.6 F (36.4 C), temperature source Oral, resp. rate 18, height 6' (1.829 m), weight 96.3 kg, SpO2 98 %.  Constitutional: No distress . Vital signs reviewed. HEENT: EOMI, oral membranes moist Neck: supple Cardiovascular: RRR without murmur. No JVD    Respiratory/Chest: CTA Bilaterally without wheezes or rales. Normal effort    GI/Abdomen: BS +, non-tender, non-distended Ext: no clubbing, cyanosis, or edema Psych: pleasant and cooperative Skin: No evidence of breakdown, no evidence of rash Neurologic: Cranial nerves II through XII intact, motor strength is 5/5 in bilateral deltoid, bicep, tricep, grip. 3 to 3+ right and 4/5 left hip flexor, knee extensors, ankle dorsiflexor and plantar flexor Normal sensation. Reasonable insight and awareness. Some delays in processing  Musculoskeletal: decreased AROM RLE. No joint swelling    Assessment/Plan: 1. Functional deficits secondary to acute left corona radiata, centrum semiovale which require 3+ hours per day of interdisciplinary therapy in a comprehensive inpatient rehab setting.  Physiatrist is providing close team supervision and 24 hour management of active medical problems listed below.  Physiatrist and rehab team continue to assess barriers to discharge/monitor patient progress toward  functional and medical goals  Care Tool:  Bathing    Body parts bathed by patient: Right arm, Left arm, Chest, Abdomen, Front perineal area, Buttocks, Right upper leg, Left upper leg, Right lower leg, Left lower leg, Face   Body parts bathed by helper: Buttocks, Right lower leg, Left lower leg Body parts n/a: Left lower leg, Right lower leg, Left upper leg, Right upper leg, Abdomen, Chest, Left arm, Right arm, Face(did not attempt this session)   Bathing assist Assist Level: Minimal Assistance - Patient > 75%     Upper Body Dressing/Undressing Upper body dressing   What is the patient wearing?: Pull over shirt    Upper body assist Assist Level: Supervision/Verbal cueing    Lower Body Dressing/Undressing Lower body dressing      What is the patient wearing?: Pants, Incontinence brief     Lower body assist Assist for lower body dressing: Moderate Assistance - Patient 50 - 74%     Toileting Toileting    Toileting assist Assist for toileting: Moderate Assistance - Patient 50 - 74%     Transfers Chair/bed transfer  Transfers assist     Chair/bed transfer assist level: Minimal Assistance - Patient > 75% Chair/bed transfer assistive device: Programmer, multimedia   Ambulation assist      Assist level: Minimal Assistance - Patient > 75% Assistive device: Walker-rolling Max distance: 26ft   Walk 10 feet activity   Assist     Assist level: Minimal Assistance - Patient > 75% Assistive device: Walker-rolling   Walk 50 feet activity   Assist Walk 50 feet with 2 turns activity did not occur: Safety/medical concerns  Assist level: Minimal Assistance - Patient > 75%  Assistive device: Walker-rolling    Walk 150 feet activity   Assist Walk 150 feet activity did not occur: Safety/medical concerns         Walk 10 feet on uneven surface  activity   Assist Walk 10 feet on uneven surfaces activity did not occur: Safety/medical concerns          Wheelchair     Assist Will patient use wheelchair at discharge?: No(No PT long term goals set)             Wheelchair 50 feet with 2 turns activity    Assist            Wheelchair 150 feet activity     Assist          Blood pressure (!) 170/82, pulse 64, temperature 97.6 F (36.4 C), temperature source Oral, resp. rate 18, height 6' (1.829 m), weight 96.3 kg, SpO2 98 %.  Medical Problem List and Plan: 1.  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  Antithrombotics: -DVT/anticoagulation: Lovenox             -antiplatelet therapy: Aspirin 325 mg daily and Plavix 75 mg daily x3 months then Plavix alone 3. Pain Management: Tylenol as needed 4. Mood: Zoloft 25 mg daily             -antipsychotic agents: N/A 5. Neuropsych: This patient is capable of making decisions on his own behalf. 6. Skin/Wound Care: Routine skin checks 7. Fluids/Electrolytes/Nutrition: Routine in and outs.  CMP ordered for tomorrow. 8.  Permissive hypertension.  Monitor with increased mobility.  Patient on Norvasc 5 mg daily, Lasix 20 mg daily, HCTZ 25 mg daily, Cozaar 100 mg daily prior to admission.  Resume as needed          Vitals:   07/23/19 1951 07/24/19 0353  BP: (!) 143/75 (!) 170/82  Pulse: 70 64  Resp: 16 18  Temp: 97.6 F (36.4 C)   SpO2: 100% 98%  6/1  10.  Diabetes mellitus with hyperglycemia.  Latest hemoglobin A1c 7.2.  SSI.  Patient on Glucotrol 2.5 mg and Glucophage 500 mg twice daily and Ozempic 0.25 mg weekly prior to admission.  Resume as needed CBG (last 3)  Recent Labs    07/23/19 1735 07/23/19 2109 07/24/19 0617   GLUCAP 118* 147* 95   Controlled 6/1, some lability in pm, took 5mg  glucotrol in PM at home, will schedule with dinner, only 2.5mg  to start 11.  Hyperlipidemia.  Lipitor/fenofibrate 12.  OSA.  Continue CPAP    LOS: 6 days A FACE TO FACE EVALUATION WAS PERFORMED  Charlett Blake 07/24/2019, 8:21 AM

## 2019-07-24 NOTE — Plan of Care (Signed)
  Problem: RH BLADDER ELIMINATION Goal: RH STG MANAGE BLADDER WITH ASSISTANCE Description: STG Manage Bladder With Min Assistance Outcome: Progressing Goal: RH STG MANAGE BLADDER WITH EQUIPMENT WITH ASSISTANCE Description: STG Manage Bladder With Equipment With Min Assistance Outcome: Progressing   Problem: RH SAFETY Goal: RH STG ADHERE TO SAFETY PRECAUTIONS W/ASSISTANCE/DEVICE Description: STG Adhere to Safety Precautions With Min Assistance/Device. Outcome: Progressing

## 2019-07-24 NOTE — Progress Notes (Signed)
Pt has his home CPAP dream station and does not wish to wear it at this time. Pt respiratory status is stable at this time w/no distress noted. RT will continue to monitor.

## 2019-07-24 NOTE — Progress Notes (Signed)
Occupational Therapy Session Note  Patient Details  Name: Justin [name] MRN: [id number] Date of Birth: [birth year]  Today's Date: 07/24/2019 OT Individual Time: [id number] OT Individual Time Calculation (min): 58 min    Short Term Goals: Week 1:  OT Short Term Goal 1 (Week 1): Pt will complete toileting all aspects with Min A for transfer, clothing management, and hygiene. OT Short Term Goal 2 (Week 1): Pt will complete LB dressing with sit to stand with Min A OT Short Term Goal 3 (Week 1): Pt will complete UB/LB bathing with tub bench with CGA using AE as needed OT Short Term Goal 4 (Week 1): Pt will perform functional transfers to various surfaces with CGA OT Short Term Goal 5 (Week 1): Pt will improve [place] in RUE as evidenced by 15 second improvement on 9 hole peg  Skilled Therapeutic Interventions/Progress Updates:    Session 1: [id number]) Pt completed bathing and dressing during session.  Supervision with min instructional cueing for sequencing supine to sit with use of the rail.  He was then able to ambulate to the shower with use of the RW and min assist.  Increased right lean through his trunk with increased motor impersistence when attempting to advance the RLE.  Once in the shower he was able to complete all bathing and drying off with min guard assist using the grab bar for support sit to stand.  He transferred out to the sink at min assist level for dressing and grooming tasks.  He was able to complete oral hygiene and combing his hair with supervision.  He was then able to donn his pullover shirt with supervision and then donned his incontinence brief and shorts with min assist sit to stand.  He was next able to donn his left sock after therapist assist with the TED stockings.  He needed max assist for the right sock secondary to decreased ability to reach down to the foot or cross it over.  Finished session with work on standing and weightshifting to the left to reach for target as pt  still demonstrates right lean and right weightshift in standing.  Pt left with call button and phone in reach with safety belt in place.    Session 2:  [phone])  Pt with no report of pain during session.  He was taken down to the therapy gym in his wheelchair and completed stand pivot transfer to the therapy mat with min assist using the RW and min assist.  Had him work on standing on reaching task to promote weightshifting to the LLE and promote better midline posture in standing.  He was able to retrieve clothespins from knee level in front of him and place them onto the basketball net with min guard assist.  He completed this several intervals and then sat down to rest.  He was able to perform task of placing them and removing them in 4, 1-2 minute intervals.  Next, had him work on functional mobility with use of the RW for intervals of 62', 50' and 50'.  The first 35' he exhibited greater RLE step length compared to the rest of the distance.  Finished session with return to the room via wheelchair and completion of toileting in standing with min assist.  Pt left in the wheelchair with spouse present and call button and phone in reach.  Safety belt in place as well.    Therapy Documentation Precautions:  Precautions Precautions: Fall Precaution Comments: Slight R lateral lean, right side  weakness Restrictions Weight Bearing Restrictions: No  Pain: Pain Assessment Pain Scale: Faces Pain Score: 0-No pain ADL: See Care Tool Section for some details of mobility and selfcare  Therapy/Group: Individual Therapy  Justin [name] OTR/L 07/24/2019, 8:59 AM

## 2019-07-24 NOTE — Progress Notes (Signed)
Patient has home CPAP at bedside and states he attends to wear it tonight. Patient stated that he didn't need any assistance in placing his nasal mask on at this time. Patient will contact RN if he needs help with calling respiratory for help.

## 2019-07-24 NOTE — Progress Notes (Signed)
Physical Therapy Session Note  Patient Details  Name: Justin [name] MRN: [id number] Date of Birth: 01-Mar-1940  Today's Date: 07/24/2019 PT Individual Time: [id number] PT Individual Time Calculation (min): 84 min   Short Term Goals: Week 1:  PT Short Term Goal 1 (Week 1): Pt will perform supine<>sit with CGA PT Short Term Goal 2 (Week 1): Pt will perform sit<>stands using LRAD with CGA PT Short Term Goal 3 (Week 1): Pt will perform stand pivot transfers using LRAD with min assist PT Short Term Goal 4 (Week 1): Pt will ambulate at least 85ft using LRAD with min assist PT Short Term Goal 5 (Week 1): Pt will initiate stair navigation  Skilled Therapeutic Interventions/Progress Updates: Pt presents sitting in w/c and agreeable to therapy.  Pt wheeled to gym for time and energy conservation.  Pt transferred sit to stand multiple times during session w/ min to CGA and verbal cues for hand placement, especially w/ stand to sit.  Pt stood to perform [employer] w/ upper quadrants and right lower to challenge balance.  Pt encouraged to cross midline and reach outside of BOS.  Pt also performed w/ unilateral LE on 6" platform.  Pt w/o LOB.  Pt performed standing toe taps to 6' platform right LE only, only clearing foot approx. 20% of time.  Pt performed w/ 7# weight to right LE and improved, removed 2/2 fatigue and then improved foot placement.  Pt performed standing marching, right HS curls 3 x 10.  Pt transferred in/Out of care w/ min A, able to bring feet into car.  Pt amb 45' w/ RW and min A, using mirror for visual input to increase right foot clearance and advancement.  Pt returned to room and amb into BR w/ constant verbal cues for right foot clearance.  Pt stood at toilet and continent of uring, but required mod A for clothing management.  Pt amb to w/c and remained in w/c w/ chair alarm on and needs in reach.  Family member in room at conclusion of treatment.     Therapy Documentation Precautions:   Precautions Precautions: Fall Precaution Comments: Slight R lateral lean, right side weakness Restrictions Weight Bearing Restrictions: No General:   Vital Signs:  Pain:no c/o pain.      Therapy/Group: Individual Therapy  Ladoris Gene 07/24/2019, 3:15 PM

## 2019-07-25 ENCOUNTER — Inpatient Hospital Stay (HOSPITAL_COMMUNITY): Payer: Medicare HMO | Admitting: Physical Therapy

## 2019-07-25 ENCOUNTER — Inpatient Hospital Stay (HOSPITAL_COMMUNITY): Payer: Medicare HMO

## 2019-07-25 ENCOUNTER — Inpatient Hospital Stay (HOSPITAL_COMMUNITY): Payer: Medicare HMO | Admitting: Occupational Therapy

## 2019-07-25 LAB — GLUCOSE, CAPILLARY
Glucose-Capillary: 87 mg/dL (ref 70–99)
Glucose-Capillary: 122 mg/dL — ABNORMAL HIGH (ref 70–99)
Glucose-Capillary: 137 mg/dL — ABNORMAL HIGH (ref 70–99)
Glucose-Capillary: 152 mg/dL — ABNORMAL HIGH (ref 70–99)

## 2019-07-25 LAB — CREATININE, SERUM
Creatinine, Ser: 0.94 mg/dL (ref 0.61–1.24)
GFR calc Af Amer: >60 mL/min (ref >60)
GFR calc non Af Amer: >60 mL/min (ref >60)

## 2019-07-25 MED ORDER — GLIPIZIDE 5 MG PO TABS
2.5000 mg | ORAL_TABLET | Freq: Every day | ORAL | Status: DC
  Administered 2019-07-26 – 2019-08-04 (×10): 2.5 mg via ORAL
  Filled 2019-07-25 (×10): qty 1

## 2019-07-25 NOTE — Progress Notes (Signed)
[place] PHYSICAL MEDICINE & REHABILITATION PROGRESS NOTE   Subjective/Complaints: Patient notes some improvement with his strength on the right side however the right side feels weaker than the left  ROS: Patient denies CP, SOB, N/V/D Objective:   No results found. No results for input(s): WBC, HGB, HCT, PLT in the last 72 hours. No results for input(s): NA, K, CL, CO2, GLUCOSE, BUN, CREATININE, CALCIUM in the last 72 hours.  Intake/Output Summary (Last 24 hours) at 07/25/2019 0906 Last data filed at 07/25/2019 0748 Gross per 24 hour  Intake 740 ml  Output 100 ml  Net 640 ml     Physical Exam: Vital Signs Blood pressure (!) 165/78, pulse (!) 58, temperature 98.2 F (36.8 C), resp. rate 16, height 6' (1.829 m), weight 97.2 kg, SpO2 100 %.  Constitutional: No distress . Vital signs reviewed.   General: No acute distress Mood and affect are appropriate Heart: Regular rate and rhythm no rubs murmurs or extra sounds Lungs: Clear to auscultation, breathing unlabored, no rales or wheezes Abdomen: Positive bowel sounds, soft nontender to palpation, nondistended Extremities: No clubbing, cyanosis, or edema Skin: No evidence of breakdown, no evidence of rash   Normal sensation. Reasonable insight and awareness. Some delays in processing  Musculoskeletal: decreased AROM RLE. No joint swelling    Assessment/Plan: 1. Functional deficits secondary to acute left corona radiata, centrum semiovale which require 3+ hours per day of interdisciplinary therapy in a comprehensive inpatient rehab setting. Physiatrist is providing close team supervision and 24 hour management of active medical problems listed below. Physiatrist and rehab team continue to assess barriers to discharge/monitor patient progress toward functional and medical goals  Care Tool:  Bathing    Body parts bathed by patient: Right arm, Left arm, Chest, Abdomen, Front perineal area, Buttocks, Right upper leg, Left  upper leg, Right lower leg, Left lower leg, Face   Body parts bathed by helper: Buttocks, Right lower leg, Left lower leg Body parts n/a: Left lower leg, Right lower leg, Left upper leg, Right upper leg, Abdomen, Chest, Left arm, Right arm, Face(did not attempt this session)   Bathing assist Assist Level: Contact Guard/Touching assist     Upper Body Dressing/Undressing Upper body dressing   What is the patient wearing?: Pull over shirt    Upper body assist Assist Level: Supervision/Verbal cueing    Lower Body Dressing/Undressing Lower body dressing      What is the patient wearing?: Pants, Incontinence brief     Lower body assist Assist for lower body dressing: Minimal Assistance - Patient > 75%     Toileting Toileting    Toileting assist Assist for toileting: Minimal Assistance - Patient > 75%     Transfers Chair/bed transfer  Transfers assist     Chair/bed transfer assist level: Minimal Assistance - Patient > 75% Chair/bed transfer assistive device: Programmer, multimedia   Ambulation assist      Assist level: Minimal Assistance - Patient > 75% Assistive device: Walker-rolling Max distance: 62'   Walk 10 feet activity   Assist     Assist level: Minimal Assistance - Patient > 75% Assistive device: Walker-rolling   Walk 50 feet activity   Assist Walk 50 feet with 2 turns activity did not occur: Safety/medical concerns  Assist level: Minimal Assistance - Patient > 75% Assistive device: Walker-rolling    Walk 150 feet activity   Assist Walk 150 feet activity did not occur: Safety/medical concerns  Walk 10 feet on uneven surface  activity   Assist Walk 10 feet on uneven surfaces activity did not occur: Safety/medical concerns         Wheelchair     Assist Will patient use wheelchair at discharge?: No(No PT long term goals set)             Wheelchair 50 feet with 2 turns activity    Assist             Wheelchair 150 feet activity     Assist          Blood pressure (!) 165/78, pulse (!) 58, temperature 98.2 F (36.8 C), resp. rate 16, height 6' (1.829 m), weight 97.2 kg, SpO2 100 %.  Medical Problem List and Plan: 1.  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 Formulized individual treatment plan based on medical history, underlying problem and comorbidities.              Continue CIR PT, OT, SLP 2.  Antithrombotics: -DVT/anticoagulation: Lovenox             -antiplatelet therapy: Aspirin 325 mg daily and Plavix 75 mg daily x3 months then Plavix alone 3. Pain Management: Tylenol as needed 4. Mood: Zoloft 25 mg daily             -antipsychotic agents: N/A 5. Neuropsych: This patient is capable of making decisions on his own behalf. 6. Skin/Wound Care: Routine skin checks 7. Fluids/Electrolytes/Nutrition: Routine in and outs.  CMP ordered for tomorrow. 8.  Permissive hypertension.  Monitor with increased mobility.  Patient on Norvasc 5 mg daily, Lasix 20 mg daily, HCTZ 25 mg daily, Cozaar 100 mg daily prior to admission.  Resume as needed          Vitals:   07/24/19 1948 07/25/19 0439  BP: (!) 144/72 (!) 165/78  Pulse: 69 (!) 58  Resp: 16 16  Temp: 98 F (36.7 C) 98.2 F (36.8 C)  SpO2: 99% 100%  6/2 10.  Diabetes mellitus with hyperglycemia.  Latest hemoglobin A1c 7.2.  SSI.  Patient on Glucotrol 2.5 mg and Glucophage 500 mg twice daily and Ozempic 0.25 mg weekly prior to admission.  Resume as needed CBG (last 3)  Recent Labs     07/24/19 1722 07/24/19 2113 07/25/19 0559  GLUCAP 169* 180* 87   Controlled 6/1, some lability in pm, took 33m glucotrol in PM at home, will schedule with dinner, only 2.559m am CBG low will switch to qam 11.  Hyperlipidemia.  Lipitor/fenofibrate 12.  OSA.  Continue CPAP    LOS: 7 days A FACE TO FACE EVALUATION WAS PERFORMED  AnCharlett Blake/03/2019, 9:06 AM

## 2019-07-25 NOTE — Progress Notes (Signed)
Team Conference Report to Patient/Family  Team Conference discussion was reviewed with the patient and caregiver, including goals, any changes in plan of care and target discharge date.  Patient and caregiver express understanding and are in agreement.  The patient has a target discharge date of 08/04/19.  Justin [name] 07/25/2019, 2:13 PM

## 2019-07-25 NOTE — Progress Notes (Signed)
Physical Therapy Session Note  Patient Details  Name: Justin [name] MRN: [id number] Date of Birth: [birth year]  Today's Date: 07/25/2019 PT Individual Time: 1302-1400 PT Individual Time Calculation (min): 58 min    Short Term Goals: Week 1:  PT Short Term Goal 1 (Week 1): Pt will perform supine<>sit with CGA PT Short Term Goal 2 (Week 1): Pt will perform sit<>stands using LRAD with CGA PT Short Term Goal 3 (Week 1): Pt will perform stand pivot transfers using LRAD with min assist PT Short Term Goal 4 (Week 1): Pt will ambulate at least 4ft using LRAD with min assist PT Short Term Goal 5 (Week 1): Pt will initiate stair navigation  Skilled Therapeutic Interventions/Progress Updates:    Pt seated in w/c upon PT arrival, agreeable to therapy tx and denies pain. Pt reports having to use the bathroom, pt ambulated 2 x 10 ft this session with RW and min assist to and from the bathroom with cues for R step length/foot clearance. Pt maintained standing balance at the toilet with CGA while standing to urinate, continent of bladder, min assist for clothing management. Pt transported to the gym. Pt performed stand pivot to the mat with RW and min assist. Pt worked on lateral weightshifting this session with use of rockerboard shifting from side to side, x 1 trial with UE support, x 1 trial with single UE support and x 2 trials without UE support - min assist and cues for full weightshift to L. Using the rockerboard pt standing while maintaining greater weightshift to the L (Keep L side of board down) while performing horseshoe toss activity x 2 trials working on L weightshift, CGA-min assist without UE support. Pt ambulated x 40 ft to his w/c with RW and min assist, therapist providing cues for L lateral weightshift during R swing, pt with improved foot clearance and step length. Pt transported to the dayroom.Stand pivot from w/c<>kinetron this session with RW and min assist. Standing on kinetron pt worked on  LE strength and lateral weightshifting to march on kinetron x 1 trial with B UE support, x 1 trial with single UE support on L and x 1 trial without UE support - min assist. Pt standing on kinetron holding with L LE down (leg extended) and R LE bent worked on maintaining L lateral weightshift without UE support while therapist applied manual perturbations to the R and posteriorly, pt with difficulty correcting. Stand pivot back to w/c with min assist. Pt ambulated x 55 ft with RW this session and min assist, facilitation for L weightshift with improved R foot clearance/step length noted, pt continues to have difficulty shifting weight/clearing R foot with turn to sit.   Therapy Documentation Precautions:  Precautions Precautions: Fall Precaution Comments: Slight R lateral lean, right side weakness Restrictions Weight Bearing Restrictions: No    Therapy/Group: Individual Therapy  Netta Corrigan, PT, DPT, CSRS 07/25/2019, 7:31 AM

## 2019-07-25 NOTE — Patient Care Conference (Signed)
Inpatient RehabilitationTeam Conference and Plan of Care Update Date: 07/25/2019   Time: 2:32 PM    Patient Name: Justin [name]      Medical Record Number: [id number]  Date of Birth: Aug 26, 1940 Sex: Male         Room/Bed: 4W22C/4W22C-01 Payor Info: Payor: [employer] MEDICARE / Plan: [place] HMO / Product Type: *No Product type* /    Admit Date/Time:  07/18/2019  2:24 PM  Primary Diagnosis:  Left middle cerebral artery stroke [facility])  Patient Active Problem List   Diagnosis Date Noted  . 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 OSA (obstructive sleep apnea)   . Controlled type 2 diabetes mellitus with hyperglycemia, without long-term current use of insulin ([place])   . CVA (cerebral vascular accident) ([place]) 07/16/2019  . Dyslipidemia   . Benign essential HTN   . Diabetes mellitus type 2 in obese ([place])   . Major depressive disorder, recurrent episode, moderate ([place])   . Gait disorder   . Acute right MCA stroke ([place]) 06/28/2019  . Ambulatory dysfunction   . Stroke ([place]) 06/24/2019  . Type 2 diabetes mellitus ([place]) 10/04/2018  . Depression 10/04/2018  . Acute CVA (cerebrovascular accident) ([place]) 09/18/2017  . Stroke (cerebrum) ([place]) 09/17/2017  . Diabetes mellitus without complication ([place]) [id number]  . Closed head injury 04/22/2013  . HLD (hyperlipidemia) 07/29/2009  . Essential hypertension 07/29/2009  . EFFUSION, PLEURAL 07/29/2009  . DIVERTICULITIS, COLON 07/29/2009  . 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 COLONIC POLYPS, HYPERPLASTIC, HX OF 07/29/2009    Expected Discharge Date: Expected Discharge Date: 08/04/19  Team Members Present: Physician leading conference: Dr. Alysia Penna Care Coodinator Present: Nestor Lewandowsky, RN, BSN, CRRN;Christina Sampson Goon, [place] Nurse Present: Genene Churn, RN PT Present: Michaelene Song, PT OT Present: Clyda Greener, OT PPS Coordinator present : Ileana Ladd, Burna Mortimer, SLP     Current Status/Progress Goal  Weekly Team Focus  Bowel/Bladder   Continent of bowel and bladder. LBM 6/1  Patient will maintain bowel and bladder continence and verbalizes toileting needs  assess q shift/prn   Swallow/Nutrition/ Hydration             ADL's   supervision for UB selfcare with min assist for LB selfcare.  Toilet transfers at min assist level as well as shower transfers.  Decreased step length noted in the RLE as well as right lean and LOB at times.  supervision overall  selfcare retraining, balance retraining, transfer training, DME education, neuromuscular re-education, pt/family education   Mobility   min assist bed mobility, CGA sit<>stands, min assist for gait with RW up to 50 ft  supervision, CGA for gait  activity tolerance, education, B LE strengthening, weightshifting, dynamic balance, gait training, attention/initiation, steps   Communication             Safety/Cognition/ Behavioral Observations            Pain   No c/o pain  Patient will remain pain free with and without activity  assess q shift/prn   Skin   Skin intact  Patient will maintain skin integrity and be free of breakdown and infection  assess q shift/prn    Rehab Goals Patient on target to meet rehab goals: Yes Rehab Goals Revised: patient on target with current goals *See Care Plan and progress notes for long and short-term goals.     Barriers to Discharge  Current Status/Progress Possible Resolutions Date Resolved   Nursing  PT                    OT                  SLP                Care Coordinator Medical stability   on target          Discharge Planning/Teaching Needs:  Goal to discharge home with spouse  Will schedule if recommended   Team Discussion:  Return with left corona radiata infarct after just discharged following right corona radiata infarct. CBGs running low; MD adjusting glucotrol administration times. Urinary urgency = timed toileting.  Progress also limited by fatigue. Goals  set for supervision and will need cues to address right side. Drags right foot with a Parkinsonian like gait.  Revisions to Treatment Plan:  None    Medical Summary Current Status: BPs running high but permissive Weekly Focus/Goal: change timing of Glucotrol  Barriers to Discharge: Other (comments);Medical stability  Barriers to Discharge Comments: bradykinesia Possible Resolutions to Barriers: may need toe cap, work on initiating movement   Continued Need for Acute Rehabilitation Level of Care: The patient requires daily medical management by a physician with specialized training in physical medicine and rehabilitation for the following reasons: Direction of a multidisciplinary physical rehabilitation program to maximize functional independence : Yes Medical management of patient stability for increased activity during participation in an intensive rehabilitation regime.: Yes Analysis of laboratory values and/or radiology reports with any subsequent need for medication adjustment and/or medical intervention. : Yes   I attest that I was present, lead the team conference, and concur with the assessment and plan of the team.   Dorien Chihuahua B 07/25/2019, 2:32 PM

## 2019-07-25 NOTE — Progress Notes (Signed)
Physical Therapy Session Note  Patient Details  Name: Justin [name] MRN: [id number] Date of Birth: 1940/12/14  Today's Date: 07/25/2019 PT Individual Time: 1694-5038 PT Individual Time Calculation (min): 42 min   Short Term Goals: Week 1:  PT Short Term Goal 1 (Week 1): Pt will perform supine<>sit with CGA PT Short Term Goal 2 (Week 1): Pt will perform sit<>stands using LRAD with CGA PT Short Term Goal 3 (Week 1): Pt will perform stand pivot transfers using LRAD with min assist PT Short Term Goal 4 (Week 1): Pt will ambulate at least 44f using LRAD with min assist PT Short Term Goal 5 (Week 1): Pt will initiate stair navigation Week 2:    Week 3:     Skilled Therapeutic Interventions/Progress Updates:   Pt received sitting in WC and agreeable to PT. [facility] instructed pt in dynamic balance training to force weight shifting R and L and then fully return to midline. Lateral/cross body reaches to obtain and then place clothes pins on basketball net with supervision assist overall with cues for awareness of midline following lateral leanl 1 UE supported on RW throughout. Additional dynamic balance training to improve COM with weight shifting using balance board and wii fit, table tilt; min assist and BUE support RW with moderate cues for improved shifting onto the L with fatigue. Sit<>stand throughout treatment with supervision assist and RW.   Gait training with RW x 1022fwith supervision assist fading to min assist with fatigue to improve weight shift L and advancement of the RLE. Pt responded well to external cue to land on heal of the RLE for full advancement and clearance resulting in improve weight shifting.   [facility] and all needs met.         Therapy Documentation Precautions:  Precautions Precautions: Fall Precaution Comments: Slight R lateral lean, right side  weakness Restrictions Weight Bearing Restrictions: No   Pain: denies  Therapy/Group: Individual Therapy  AuLorie Phenix/03/2019, 4:19 PM

## 2019-07-25 NOTE — Progress Notes (Signed)
Occupational Therapy Session Note  Patient Details  Name: Justin [name] MRN: [id number] Date of Birth: [birth year]  Today's Date: 07/25/2019 OT Individual Time: [id number] OT Individual Time Calculation (min): 58 min    Short Term Goals: Week 1:  OT Short Term Goal 1 (Week 1): Pt will complete toileting all aspects with Min A for transfer, clothing management, and hygiene. OT Short Term Goal 2 (Week 1): Pt will complete LB dressing with sit to stand with Min A OT Short Term Goal 3 (Week 1): Pt will complete UB/LB bathing with tub bench with CGA using AE as needed OT Short Term Goal 4 (Week 1): Pt will perform functional transfers to various surfaces with CGA OT Short Term Goal 5 (Week 1): Pt will improve [place] in RUE as evidenced by 15 second improvement on 9 hole peg  Skilled Therapeutic Interventions/Progress Updates:     Session 1 [id number]) Pt began session with transfer from supine to sit EOB.  Min assist with mod instructional cueing was needed for technique.  Once sitting, he demonstrated LOB posteriorly when attempting to turn his head to the right behind him when looking at the chair in his room.  Mod assist needed to regain balance.  He next completed stand pivot transfer to the wheelchair with min assist and then worked on grooming tasks at the sink as well as dressing.  He combed his hair and brushed his teeth with independence from the wheelchair.  While at the sink, he reported needing to go to the bathroom.  While ambulating to the bathroom, he became incontinent of urine prior to reaching the toilet.  He demonstrated decreased ability to advance the RLE with transfer, causing him to take a longer period of time to reach the toilet as well.  Once on the toilet, he was able to complete toilet hygiene with min assist sit to stand.  Next, he transferred out to the wheelchair to finish washing his lower legs secondary to the incontinent episode with setup assist.  Session was finished with  dressing tasks at the sink with overall min assist for LB and supervision for UB.  Increased assist is still needed for donning TEDs and gripper sock on the right foot.  Pt left in the wheelchair at end of session with call button and phone in reach and safety alarm belt in place.    Session 2 (1502-1530) Pt completed BUE strengthening with use of the UE ergonometer.  He completed 2 sets of 5 mins with resistance on level 7, one forward and then one backwards.  He was able to maintain RPMs at 21-23 throughout the session.  Next, had pt complete standing at the [employer] with use of the RW for support in order to complete one minute intervals.  He completed these in an average time of 2.6-2.75 seconds.  While standing, he did need min assist as he fatigued with LOB posteriorly and to the right secondary to right knee flexion.  Finished session with return to the room via wheelchair.  Pt left with his spouse with call button and phone in reach and safety.    Therapy Documentation Precautions:  Precautions Precautions: Fall Precaution Comments: Slight R lateral lean, right side weakness Restrictions Weight Bearing Restrictions: No  Pain: Pain Assessment Pain Scale: Faces Pain Score: 0-No pain ADL: See Care Tool Section for some details of mobility and selfcare  Therapy/Group: Individual Therapy  Abimbola Aki OTR/L 07/25/2019, 8:58 AM

## 2019-07-26 ENCOUNTER — Inpatient Hospital Stay (HOSPITAL_COMMUNITY): Payer: Medicare HMO | Admitting: Occupational Therapy

## 2019-07-26 ENCOUNTER — Inpatient Hospital Stay (HOSPITAL_COMMUNITY): Payer: Medicare HMO | Admitting: Physical Therapy

## 2019-07-26 LAB — GLUCOSE, CAPILLARY
Glucose-Capillary: 78 mg/dL (ref 70–99)
Glucose-Capillary: 86 mg/dL (ref 70–99)
Glucose-Capillary: 142 mg/dL — ABNORMAL HIGH (ref 70–99)
Glucose-Capillary: 183 mg/dL — ABNORMAL HIGH (ref 70–99)

## 2019-07-26 NOTE — Progress Notes (Signed)
[place] PHYSICAL MEDICINE & REHABILITATION PROGRESS NOTE   Subjective/Complaints:  Pt is aware of d/c date, discussed CBG and BP with pt  ROS: Patient denies CP, SOB, N/V/D Objective:   No results found. No results for input(s): WBC, HGB, HCT, PLT in the last 72 hours. Recent Labs    07/25/19 1124  CREATININE 0.94    Intake/Output Summary (Last 24 hours) at 07/26/2019 0757 Last data filed at 07/26/2019 [id number] Gross per 24 hour  Intake --  Output 400 ml  Net -400 ml     Physical Exam: Vital Signs Blood pressure (!) 163/67, pulse (!) 56, temperature 97.8 F (36.6 C), temperature source Oral, resp. rate 18, height 6' (1.829 m), weight 98.3 kg, SpO2 100 %.  Constitutional: No distress . Vital signs reviewed.    General: No acute distress Mood and affect are appropriate Heart: Regular rate and rhythm no rubs murmurs or extra sounds Lungs: Clear to auscultation, breathing unlabored, no rales or wheezes Abdomen: Positive bowel sounds, soft nontender to palpation, nondistended Extremities: No clubbing, cyanosis, or edema  Normal sensation. Reasonable insight and awareness. Some delays in processing  Musculoskeletal: decreased AROM RLE. No joint swelling    Assessment/Plan: 1. Functional deficits secondary to acute left corona radiata, centrum semiovale which require 3+ hours per day of interdisciplinary therapy in a comprehensive inpatient rehab setting.  Physiatrist is providing close team supervision and 24 hour management of active medical problems listed below.  Physiatrist and rehab team continue to assess barriers to discharge/monitor patient progress toward functional and medical goals  Care Tool:  Bathing    Body parts bathed by patient: Right arm, Left arm, Chest, Abdomen, Front perineal area, Buttocks, Right upper leg, Left upper leg, Right lower leg, Left lower leg, Face   Body parts bathed by helper: Buttocks, Right lower leg, Left lower leg Body parts  n/a: Left lower leg, Right lower leg, Left upper leg, Right upper leg, Abdomen, Chest, Left arm, Right arm, Face(did not attempt this session)   Bathing assist Assist Level: Contact Guard/Touching assist     Upper Body Dressing/Undressing Upper body dressing   What is the patient wearing?: Pull over shirt    Upper body assist Assist Level: Supervision/Verbal cueing    Lower Body Dressing/Undressing Lower body dressing      What is the patient wearing?: Pants, Incontinence brief     Lower body assist Assist for lower body dressing: Minimal Assistance - Patient > 75%     Toileting Toileting    Toileting assist Assist for toileting: Minimal Assistance - Patient > 75%     Transfers Chair/bed transfer  Transfers assist     Chair/bed transfer assist level: Contact Guard/Touching assist Chair/bed transfer assistive device: Armrests, Programmer, multimedia   Ambulation assist      Assist level: Minimal Assistance - Patient > 75% Assistive device: Walker-rolling Max distance: 55 ft   Walk 10 feet activity   Assist     Assist level: Minimal Assistance - Patient > 75% Assistive device: Walker-rolling   Walk 50 feet activity   Assist Walk 50 feet with 2 turns activity did not occur: Safety/medical concerns  Assist level: Minimal Assistance - Patient > 75% Assistive device: Walker-rolling    Walk 150 feet activity   Assist Walk 150 feet activity did not occur: Safety/medical concerns         Walk 10 feet on uneven surface  activity   Assist Walk 10 feet on uneven surfaces  activity did not occur: Safety/medical concerns         Wheelchair     Assist Will patient use wheelchair at discharge?: No(No PT long term goals set)             Wheelchair 50 feet with 2 turns activity    Assist            Wheelchair 150 feet activity     Assist          Blood pressure (!) 163/67, pulse (!) 56, temperature 97.8 F  (36.6 C), temperature source Oral, resp. rate 18, height 6' (1.829 m), weight 98.3 kg, SpO2 100 %.  Medical Problem List and Plan: 1.  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  Antithrombotics: -DVT/anticoagulation: Lovenox             -antiplatelet therapy: Aspirin 325 mg daily and Plavix 75 mg daily x3 months then Plavix alone 3. Pain Management: Tylenol as needed 4. Mood: Zoloft 25 mg daily             -antipsychotic agents: N/A 5. Neuropsych: This patient is capable of making decisions on his own behalf. 6. Skin/Wound Care: Routine skin checks 7. Fluids/Electrolytes/Nutrition: Routine in and outs.  CMP ordered for tomorrow. 8.  Permissive hypertension.  Monitor with increased mobility.  Patient on Norvasc 5 mg daily, Lasix 20 mg daily, HCTZ 25 mg daily, Cozaar 100 mg daily prior to admission.  Resume as needed          Vitals:   07/25/19 1938 07/26/19 0419  BP: 134/67 (!) 163/67  Pulse: 66 (!) 56  Resp: 18 18  Temp: 98.6 F (37 C) 97.8 F (36.6 C)  SpO2: 100% 100%  6/3 elevated systolic early am , permissive HTN for now  10.  Diabetes mellitus with hyperglycemia.  Latest hemoglobin A1c 7.2.  SSI.  Patient on Glucotrol 2.5 mg and Glucophage 500 mg twice daily and Ozempic 0.25 mg weekly prior to admission.  Resume as needed CBG (last 3)  Recent Labs    07/25/19 1626 07/25/19 2107 07/26/19 0622  GLUCAP 137* 152* 78   Controlled 6/3, some lability in pm, took 5mg  glucotrol in PM at home, will schedule with dinner, only 2.5mg , am CBG low will switch to qam, qhs snack discussed with pt 11.  Hyperlipidemia.  Lipitor/fenofibrate 12.  OSA.   Continue CPAP    LOS: 8 days A FACE TO FACE EVALUATION WAS PERFORMED  Charlett Blake 07/26/2019, 7:57 AM

## 2019-07-26 NOTE — Progress Notes (Signed)
Occupational Therapy Weekly Progress Note  Patient Details  Name: Justin [name] MRN: [id number] Date of Birth: [birth year]  Beginning of progress report period: Jul 19, 2019 End of progress report period: July 26, 2019  Today's Date: 07/26/2019 OT Individual Time: 4332-9518 OT Individual Time Calculation (min): 56 min    Patient has met 3 of 5 short term goals.  Pt is making steady progress with OT at this time and currently he is able to complete all bathing sit to stand at a min guard assist level with use of the grab bars for support.  He is able to complete UB dressing at supervision level as well as LB dressing with min to mod assist.  He is able to donn his brief and his pants at min assist level but needs mod assist for donning his gripper socks along with the TED stockings.  He was able to complete toilet transfers as well as walk-in shower transfers with min assist using the RW.  Justin [name] continues to demonstrate decreased ability to consistently advance the RLE with mobility.  He begins with adequate step length in the RLE, but this decreases after a few steps and then the motor impersistence kicks in and he is unable to advance the RLE adequately.  He continues to need mod instructional cueing for hand placement to push up from the surface he is sitting on as well as to reach back to the surface when he goes to sit.  Overall, he is making steady progress with OT recommend continued CIR level therapy with expected discharge on 6/12.   Patient continues to demonstrate the following deficits: muscle weakness, impaired timing and sequencing, unbalanced muscle activation and decreased coordination, decreased safety awareness and decreased memory and decreased standing balance, decreased postural control and decreased balance strategies and therefore will continue to benefit from skilled OT intervention to enhance overall performance with BADL and Reduce care partner burden.  Patient progressing  toward long term goals..  Continue plan of care.  OT Short Term Goals Week 2:  OT Short Term Goal 1 (Week 2): Continue working on established LTGs set at supervision overall.  Skilled Therapeutic Interventions/Progress Updates:    Pt worked on bathing and dressing to start session.  He was able to ambulate to the shower bench with min assist and increased time using the RW for support.  He exhibited decreased ability to advance the RLE with shuffling gait, only on the right side.  Once he reached the shower, he was able to undress sit to stand with min guard assist using the grab bars for support.  Justin [name] completed his shower with min guard assist sit to stand and then transferred out to the sink for dressing and grooming tasks.  He was able to comb his hair with setup as well as completing UB dressing.  He then completed LB dressing sit to stand with min assist for donning brief and pants.  He needed total assist for TEDs with min assist to donn the gripper socks.  Once dressing was complete, he was rolled down to the dayroom where he transferred over to the N[employer] and completed 2 sets of 6 mins each with use steps per minute around 40-45 and resistance at level 5.  He needed a rest break of approximately two mins between sets with min instructional cueing to maintain selective attention on the task.  At times he would get distracted by his surroundings and stop working on the [employer].  Finished  session with return to the wheelchair stand pivot with min guard and return back to the room via wheelchair.  Pt was left sitting up in the wheelchair with call button and phone in reach.    Therapy Documentation Precautions:  Precautions Precautions: Fall Precaution Comments: Slight R lateral lean, right side weakness Restrictions Weight Bearing Restrictions: No  Pain: Pain Assessment Pain Scale: Faces Pain Score: 0-No pain ADL: See Care Tool Section for some details of mobility and  selfcare  Therapy/Group: Individual Therapy  Justin [name] OTR/L 07/26/2019, 3:58 PM

## 2019-07-26 NOTE — Progress Notes (Signed)
Physical Therapy Weekly Progress Note  Patient Details  Name: Justin [name] MRN: [id number] Date of Birth: Sep 11, 1940  Beginning of progress report period: Jul 19, 2019 End of progress report period: July 26, 2019  Today's Date: 07/26/2019 PT Individual Time: 0920-1017 and 1420-1530 PT Individual Time Calculation (min): 57 min and 70 min  Patient has met 4 of 5 short term goals.  Mr. Volker is progressing well with therapy though continues to demonstrate impaired sustained attention, dual-task abilities, and R LE strength and motor control resulting in impaired R LE gait mechanics and increased risk for falls. He is performing supine<>sit with min assist, sit<>stands using RW with CGA, stand pivot transfers using RW with CGA/min assist, ambulating up to 65f using RW with min assist and ascending/descending 4 steps using HR with CGA.   Patient continues to demonstrate the following deficits muscle weakness and muscle joint tightness, decreased cardiorespiratoy endurance, impaired timing and sequencing, unbalanced muscle activation, motor apraxia and decreased motor planning,  , decreased initiation, decreased awareness, decreased problem solving, decreased safety awareness, decreased memory and delayed processing and decreased sitting balance, decreased standing balance, decreased postural control and decreased balance strategies and therefore will continue to benefit from skilled PT intervention to increase functional independence with mobility.  Patient progressing toward long term goals..  Continue plan of care.  PT Short Term Goals Week 1:  PT Short Term Goal 1 (Week 1): Pt will perform supine<>sit with CGA PT Short Term Goal 1 - Progress (Week 1): Progressing toward goal PT Short Term Goal 2 (Week 1): Pt will perform sit<>stands using LRAD with CGA PT Short Term Goal 2 - Progress (Week 1): Met PT Short Term Goal 3 (Week 1): Pt will perform stand pivot transfers using LRAD with min assist PT  Short Term Goal 3 - Progress (Week 1): Met PT Short Term Goal 4 (Week 1): Pt will ambulate at least 587fusing LRAD with min assist PT Short Term Goal 4 - Progress (Week 1): Met PT Short Term Goal 5 (Week 1): Pt will initiate stair navigation PT Short Term Goal 5 - Progress (Week 1): Met Week 2:  PT Short Term Goal 1 (Week 2): = to LTGs based on ELOS  Skilled Therapeutic Interventions/Progress Updates:  Ambulation/gait training;Community reintegration;DME/adaptive equipment instruction;Neuromuscular re-education;Psychosocial support;Stair training;UE/LE Strength taining/ROM;Wheelchair propulsion/positioning;Balance/vestibular training;Discharge planning;Functional electrical stimulation;Pain management;Skin care/wound management;UE/LE Coordination activities;Therapeutic Activities;Cognitive remediation/compensation;Disease management/prevention;Functional mobility training;Patient/family education;Splinting/orthotics;Visual/perceptual remediation/compensation;Therapeutic Exercise   Session 1: Pt received sitting in w/c with his LEs elevated on the bed (wearing TEDs). Pt agreeable to therapy session. Therapist reinforced education on keeping feet elevated to help with the edema. Sit>stand w/c>RW with CGA for steadying - pt recalling correct hand placement but continues to require min cuing for R foot positioning prior to standing. Gait ~15x2 in/out of bathroom using RW with CGA/min assist for steadying and AD management - max cuing for proper AD management over bathroom door threshold and max cuing for R LE foot clearance and step length as pt taking short, shuffled steps on that side. Standing with CGA/min assist for balance performed LB clothing management without assist - demonstrates R posterior lean in standing. Continent of bladder, per pt report.  Transported to/from gym in w/c for time management and energy conservation. Gait training 6064fsing RW with 7lb ankle weight on R LE for error  augmentation and increased focus/attention on increased R LE hip/knee flexion for improved foot clearance and step length - requires mod/max cuing for R heel strike on  initial contact. Gait training ~15f using RW without weight for carryover with pt continuing to require same amount of cuing and continues to demonstrates lack of adequate R lateral stepping and LE rotation when turning to sit. Attempted standing, no UE support, R lateral foot taps to cone but pt unable to hit the smaller target therefore transitioned to same task but to ~7-8" step to provide larger target that pt can place his foot on to recover balance between reps - used external target when pt bringing foot down off step to avoid narrow BOS with excessive hip adduction - min assist for balance with max cuing and mirror feedback for upright posture. Gait ~823fx2 to/from // bars using RW with CGA for steadying and continued max cuing for R LE stepping. In // bars, no UE support, using agility ladder performed R/L lateral side stepping with min assist for balance and max sequential cuing for upright stance and weight shifting. Transported back to room and left seated in w/c with needs in reach and seat belt alarm on.  Session 2: Pt received sitting in w/c and agreeable to therapy session. Pt reports need to use bathroom. Sit>stand w/c>RW with CGA for steadying. Gait ~1565f2 to/from bathroom using RW with CGA/min assist for steadying/AD management - repeated cuing for increased R LE step length and safe AD management over bathroom door threshold. Standing with CGA using R UE support on grab bar (due to R posterior lean) performed LB clothing management with min assist. Sit<>stand to/from toilet using grab bar with CGA. Continent of bladder. Transported to/from gym in w/c for time management and energy conservation. Sit<>stands throughout session with CGA for safety. Gait training 1f90fing RW with CGA for steadying - prior to ambulating  reiterated focus on R LE hip/knee flexion for foot clearance and step length with cuing throughout to ensure attention on that with pt demonstrating improving gait mechanics. R LE NMR using stairs with B UE support on HRs while stepping R foot on/off 2nd step wearing 7lb weight with focus and cuing for increased hip/knee flexion 2x10 reps with weight and x10 reps without weight - demonstrates poor hamstring muscle activation/contraction when bringing foot back off step causing his foot to "drop" down with poor eccentric control, cuing for improvement. Ascended/descended 4 steps using L HR, per home set-up, via lateral side stepping technique with CGA for steadying - cuing for increased R lateral step length when ascending to allow room for L foot on step. Gait training [id number] RW with CGA/min assist for steadying and pt demonstrating ability to focus and perform reciprocal stepping with R LE 80% of the time but when distractions occurred in environment lost concentration on R LE causing worsening of impairments. Block practice stand pivot transfer w/c<>EOM using RW with CGA for steadying - continues to demonstrate lack of adequate R LE hip abduction/adduction to maintain solid BOS during transfer. R/L lateral side stepping in front of mat using RW with pt continuing to demonstrate significant lack of L lateral weight shift during stance and decreased R LE hip abduction and adduction despite cuing and manual facilitation for weight shifting. Gait ~6ft t1f/ bars using RW with CGA for steadying and max cuing for R LE stepping. In // bars without UE support performed R/L lateral side stepping through agility latter with MAX sequential cuing for upright trunk posture, L lateral weight shift, then R LE stepping or vice versa pending direction - min assist for balance throughout. Transported back to  room and left seated in w/c with needs in reach and seat belt alarm on.  Therapy Documentation Precautions:   Precautions Precautions: Fall Precaution Comments: Slight R lateral lean, right side weakness Restrictions Weight Bearing Restrictions: No  Pain:   Session 1: Denies pain during session.  Session 2: Reports L knee pain that started towards end of session during block practice transfers - pt reports it feels like his knee "pops out of place and then I'm gone" referring to feeling it buckle - provided seated rest breaks and seated L knee flexion/extension AROM and pt reports decreased/dissipation of symptoms.  Therapy/Group: Individual Therapy  Tawana Scale, PT, DPT 07/26/2019, 7:56 AM

## 2019-07-26 NOTE — Progress Notes (Signed)
Patient ID: Justin [name], male   DOB: [birth year], 79 y.o.   MRN: [id number]  Sw called to follow up with patient [facility]-per spouse request. No answer, left voicemail. Will follow up with [facility] later in week. [id number] [id number]

## 2019-07-26 NOTE — Plan of Care (Signed)
  Problem: Consults Goal: RH STROKE PATIENT EDUCATION Description: See Patient Education module for education specifics  Outcome: Progressing   Problem: RH BLADDER ELIMINATION Goal: RH STG MANAGE BLADDER WITH ASSISTANCE Description: STG Manage Bladder With Min Assistance Outcome: Progressing Goal: RH STG MANAGE BLADDER WITH EQUIPMENT WITH ASSISTANCE Description: STG Manage Bladder With Equipment With Min Assistance Outcome: Progressing   Problem: RH SKIN INTEGRITY Goal: RH STG SKIN FREE OF INFECTION/BREAKDOWN Description: No new breakdown with min assist  Outcome: Progressing   Problem: RH SAFETY Goal: RH STG ADHERE TO SAFETY PRECAUTIONS W/ASSISTANCE/DEVICE Description: STG Adhere to Safety Precautions With Min Assistance/Device. Outcome: Progressing   Problem: RH PAIN MANAGEMENT Goal: RH STG PAIN MANAGED AT OR BELOW PT'S PAIN GOAL Description: < 4 out of 10.  Outcome: Progressing   Problem: RH BOWEL ELIMINATION Goal: RH STG MANAGE BOWEL WITH ASSISTANCE Description: STG Manage Bowel with  Min Assistance. Outcome: Progressing Goal: RH STG MANAGE BOWEL W/MEDICATION W/ASSISTANCE Description: STG Manage Bowel with Medication with  mod I Assistance. Outcome: Progressing   

## 2019-07-27 ENCOUNTER — Inpatient Hospital Stay (HOSPITAL_COMMUNITY): Payer: Medicare HMO | Admitting: Physical Therapy

## 2019-07-27 ENCOUNTER — Inpatient Hospital Stay (HOSPITAL_COMMUNITY): Payer: Medicare HMO | Admitting: Occupational Therapy

## 2019-07-27 LAB — GLUCOSE, CAPILLARY
Glucose-Capillary: 120 mg/dL — ABNORMAL HIGH (ref 70–99)
Glucose-Capillary: 92 mg/dL (ref 70–99)
Glucose-Capillary: 172 mg/dL — ABNORMAL HIGH (ref 70–99)
Glucose-Capillary: 206 mg/dL — ABNORMAL HIGH (ref 70–99)

## 2019-07-27 NOTE — Progress Notes (Signed)
Occupational Therapy Session Note  Patient Details  Name: Justin [name] MRN: [id number] Date of Birth: 03/31/1940  Today's Date: 07/27/2019 OT Individual Time: 3007-6226 OT Individual Time Calculation (min): 76 min    Short Term Goals: Week 2:  OT Short Term Goal 1 (Week 2): Continue working on established LTGs set at supervision overall.  Skilled Therapeutic Interventions/Progress Updates:    Pt completed transfer to the therapy mat with min guard assist using the RW for support.  He worked on [employer] by holding a small therapy ball with BUEs and then rotating side to side while holding the ball at 90 degrees shoulder flexion.  He was able to then progress to holding a four pound weight with BUEs and working on the same rotational movements.  Transitioned to supine to work on rolling side to side for strengthening of abdominal muscles.  He needed mod demonstrational cueing for technique to roll.  He was able to complete rolling to both sides with supervision, but demonstrated greater efficiency with rolling to the right.  He needed min assist for transition to sitting from sidelying on the left side.  Next, he was able to work on BUE coordination and strengthening with ball toss and catch in sitting.  He progressed to working on standing balance and weightshifting in standing while stepping up on a 6" step with the RLE.  He needed min assist for balance as well as left weightshift in standing.  Progressed to functional mobility with and without use of the RW.  Pt still with decreased step length on the right side, requiring max instructional cueing with min assist for weightshift to the left.  He was able to to ambulate approximately 59' X2 with overall min assist.  Increased trunk flexion is also noted with mobility affecting his ability to advance the RLE along with decreased left weightshift.  Finished session with return to the room via wheelchair and pt staying sitting up in the  wheelchair with call button and phone in reach.  Safety belt also in place as well.    Therapy Documentation Precautions:  Precautions Precautions: Fall Precaution Comments: Slight R lateral lean, right side weakness Restrictions Weight Bearing Restrictions: No  Pain: Pain Assessment Pain Scale: 0-10 Pain Score: 0-No pain ADL: See Care Tool Section for some details of mobility and selfcare  Therapy/Group: Individual Therapy  Inocencio Roy OTR/L 07/27/2019, 4:14 PM

## 2019-07-27 NOTE — Progress Notes (Signed)
Patient places self on/off CPAP when ready. RT instructed patient to have RT called if he needs assistance. RT will monitor as needed.

## 2019-07-27 NOTE — Progress Notes (Signed)
Physical Therapy Session Note  Patient Details  Name: Justin [name] MRN: [id number] Date of Birth: 25-Apr-1940  Today's Date: 07/27/2019 PT Individual Time: 1303-1400 PT Individual Time Calculation (min): 57 min   Short Term Goals: Week 2:  PT Short Term Goal 1 (Week 2): = to LTGs based on ELOS  Skilled Therapeutic Interventions/Progress Updates:    Patient received up in [facility], pleasant and willing to participate in session. Started with exercise on Nustep on resistance 5 with BLEs/BUEs for 8 minutes at goal of 50SPM, able to maintain pace approximately 90% of the time. Able to perform multiple sit to stand and bed<-> chair transfers with min guard/RW, then tolerated additional gait training 162f then 640f limited by fatigue and deterioration of gait mechanics including "Sticking" of R foot with short step lengths when fatigued. Otherwise worked on standing balance training including toe taps first on cones then on 4 inch box with alternating pattern and without BUE support, also cross midline reach with boom whackers to promote lateral weight shifting; overall required MinA for balance during these tasks. Examined left knee and noted L patella stiff and biased laterally and potentially explaining L knee pain with walking- performed patella mobilizations and also discussed with primary PT. [facility], seatbelt alarm active and all needs otherwise met.   Therapy Documentation Precautions:  Precautions Precautions: Fall Precaution Comments: Slight R lateral lean, right side weakness Restrictions Weight Bearing Restrictions: No Pain: Pain Assessment Pain Scale: 0-10 Pain Score: 0-No pain    Therapy/Group: Individual Therapy   KrWindell NorfolkDPT, PN1   Supplemental Physical Therapist [place]  Pager [phone] [phone]  07/27/2019, 3:35 PM

## 2019-07-27 NOTE — Progress Notes (Signed)
Physical Therapy Session Note  Patient Details  Name: Justin [name] MRN: [id number] Date of Birth: 1940-10-21  Today's Date: 07/27/2019 PT Individual Time: 1007-1100 PT Individual Time Calculation (min): 53 min   Short Term Goals: Week 2:  PT Short Term Goal 1 (Week 2): = to LTGs based on ELOS  Skilled Therapeutic Interventions/Progress Updates:    Pt received sitting in w/c and agreeable to therapy session. Donned  BLE TEDs max assist. Pt requested eye drops - Chelsea, RN present to administer. Transported to/from gym in w/c for time management and energy conservation. Sit<>stands with CGA for steadying during session. Gait training 151ft using RW starting with CGA for steadying progressed to heavy min assist for balance at end due to fatigue - started with reciprocal pattern and adequate R hip/knee flexion for foot clearance and step length but progressed to significant shuffling and leaving R foot behind - required 2 standing rest breaks to "reset" and max cuing for improved R LE gait mechanics with manual facilitation for L lateral weight shifting. Dynamic standing balance task with R LE on 8" step with dual-cognitive task of grasping clothespins from low surface on L then placing on basketball goal alternating with R foot taps on yoga block on top of step x8  reps with min assist for balance throughout - mirror feedback with multimodal cuing for weight shift and upright posture. Dynamic standing balance and R LE NMR, no UE support, via R LE foot taps on 8" step targeting L lateral weight shift and increased R LE hip/knee flexion with min assist for balance - mirror feedback and cuing for upright posture, hip extension, and L lateral weight shift. Gait training ~166ft using RW with CGA progressed to heavier min assist due to progressively worsening R LE hip/knee flexion due to fatigue and distractions in environment. Gait training ~36ft to/from // bars, no AD, with focus on improved upright posture  (as pt leans forward onto RW) and increased R/L lateral weight shift into stance limb with mod assist for balance throughout. R stand pivot to w/c, no AD, with CGA/min assist and cuing for increased R lateral step length. Transported back to room and left seated in w/c with needs in reach and seat belt alarm on.  Therapy Documentation Precautions:  Precautions Precautions: Fall Precaution Comments: Slight R lateral lean, right side weakness Restrictions Weight Bearing Restrictions: No  Pain: Denies pain during session.   Therapy/Group: Individual Therapy  Tawana Scale, PT, DPT 07/27/2019, 7:55 AM

## 2019-07-27 NOTE — Progress Notes (Signed)
Justin [name]   Subjective/Complaints: Slept well, Right eye injected but pt denies pain or D/C , using moisture drops Left eye , hx of recent cataract surgery   ROS: Patient denies CP, SOB, N/V/D Objective:   No results found. No results for input(s): WBC, HGB, HCT, PLT in the last 72 hours. Recent Labs    07/25/19 1124  CREATININE 0.94    Intake/Output Summary (Last 24 hours) at 07/27/2019 0842 Last data filed at 07/27/2019 0700 Gross per 24 hour  Intake 350 ml  Output 500 ml  Net -150 ml     Physical Exam: Vital Signs Blood pressure (!) 159/66, pulse (!) 57, temperature 97.7 F (36.5 C), temperature source Oral, resp. rate 18, height 6' (1.829 m), weight 97.8 kg, SpO2 100 %.  Constitutional: No distress . Vital signs reviewed.   Eye- Left sclera clear no conjunctival injection , Right sclera injected, no discharge,  General: No acute distress Mood and affect are appropriate Heart: Regular rate and rhythm no rubs murmurs or extra sounds Lungs: Clear to auscultation, breathing unlabored, no rales or wheezes Abdomen: Positive bowel sounds, soft nontender to palpation, nondistended Extremities: No clubbing, cyanosis, or edema  Normal sensation. Reasonable insight and awareness. Some delays in processing  Musculoskeletal: decreased AROM RLE. No joint swelling    Assessment/Plan: 1. Functional deficits secondary to acute left corona radiata, centrum semiovale which require 3+ hours per day of interdisciplinary therapy in a comprehensive inpatient rehab setting.  Physiatrist is providing close team supervision and 24 hour management of active medical problems listed below.  Physiatrist and rehab team continue to assess barriers to discharge/monitor patient progress toward functional and medical goals  Care Tool:  Bathing    Body parts bathed by patient: Right arm, Left arm, Chest, Abdomen, Front perineal area, Buttocks,  Right upper leg, Left upper leg, Right lower leg, Left lower leg, Face   Body parts bathed by helper: Buttocks, Right lower leg, Left lower leg Body parts n/a: Left lower leg, Right lower leg, Left upper leg, Right upper leg, Abdomen, Chest, Left arm, Right arm, Face(did not attempt this session)   Bathing assist Assist Level: Contact Guard/Touching assist     Upper Body Dressing/Undressing Upper body dressing   What is the patient wearing?: Pull over shirt    Upper body assist Assist Level: Supervision/Verbal cueing    Lower Body Dressing/Undressing Lower body dressing      What is the patient wearing?: Pants, Incontinence brief     Lower body assist Assist for lower body dressing: Minimal Assistance - Patient > 75%     Toileting Toileting    Toileting assist Assist for toileting: Minimal Assistance - Patient > 75%     Transfers Chair/bed transfer  Transfers assist     Chair/bed transfer assist level: Contact Guard/Touching assist Chair/bed transfer assistive device: Programmer, multimedia   Ambulation assist      Assist level: Minimal Assistance - Patient > 75% Assistive device: Walker-rolling Max distance: 53ft   Walk 10 feet activity   Assist     Assist level: Minimal Assistance - Patient > 75% Assistive device: Walker-rolling   Walk 50 feet activity   Assist Walk 50 feet with 2 turns activity did not occur: Safety/medical concerns  Assist level: Minimal Assistance - Patient > 75% Assistive device: Walker-rolling    Walk 150 feet activity   Assist Walk 150 feet activity did not occur: Safety/medical concerns  Walk 10 feet on uneven surface  activity   Assist Walk 10 feet on uneven surfaces activity did not occur: Safety/medical concerns         Wheelchair     Assist Will patient use wheelchair at discharge?: No(No PT long term goals set)             Wheelchair 50 feet with 2 turns  activity    Assist            Wheelchair 150 feet activity     Assist          Blood pressure (!) 159/66, pulse (!) 57, temperature 97.7 F (36.5 C), temperature source Oral, resp. rate 18, height 6' (1.829 m), weight 97.8 kg, SpO2 100 %.  Medical Problem List and Plan: 1.  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  Antithrombotics: -DVT/anticoagulation: Lovenox             -antiplatelet therapy: Aspirin 325 mg daily and Plavix 75 mg daily x3 months then Plavix alone 3. Pain Management: Tylenol as needed 4. Mood: Zoloft 25 mg daily             -antipsychotic agents: N/A 5. Neuropsych: This patient is capable of making decisions on his own behalf. 6. Skin/Wound Care: Routine skin checks 7. Fluids/Electrolytes/Nutrition: Routine in and outs.  CMP ordered for tomorrow. 8.  Permissive hypertension.  Monitor with increased mobility.  Patient on Norvasc 5 mg daily, Lasix 20 mg daily, HCTZ 25 mg daily, Cozaar 100 mg daily prior to admission.  Resume as needed          Vitals:   07/26/19 1934 07/27/19 0452  BP: 133/70 (!) 159/66  Pulse: 68 (!) 57  Resp: 16 18  Temp: 97.8 F (36.6 C) 97.7 F (36.5 C)  SpO2: 99% 100%  6/4 elevated systolic early am , permissive HTN for now  10.  Diabetes mellitus with hyperglycemia.  Latest hemoglobin A1c 7.2.  SSI.  Patient on Glucotrol 2.5 mg and Glucophage 500 mg twice daily and Ozempic 0.25 mg weekly prior to admission.  Resume as needed CBG (last 3)  Recent Labs    07/26/19 1736 07/26/19 2117 07/27/19 0613  GLUCAP 142* 183* 120*   Improve 6/4 , some lability in pm, took 5mg  glucotrol in PM at  home, will schedule with dinner, only 2.5mg , am CBG low will switch to qam, qhs snack discussed with pt 11.  Hyperlipidemia.  Lipitor/fenofibrate 12.  OSA.  Continue CPAP   13.  RIght eye injected but no drainage, asked pt to use his mositurizing drops OU LOS: 9 days A FACE TO Kennett E Justin [name] 07/27/2019, 8:42 AM

## 2019-07-27 NOTE — Progress Notes (Signed)
Patient ID: Justin [name], male   DOB: 15-Aug-1940, 79 y.o.   MRN: [id number]  Sw provided patient's [facility] SW Kellie Simmering) with Team Conference Notes from week on 07/25/2019.   Kellie Simmering: [phone]  [phone] (fax)

## 2019-07-28 DIAGNOSIS — E1165 Type 2 diabetes mellitus with hyperglycemia: Secondary | ICD-10-CM

## 2019-07-28 DIAGNOSIS — G4733 Obstructive sleep apnea (adult) (pediatric): Secondary | ICD-10-CM

## 2019-07-28 DIAGNOSIS — R7309 Other abnormal glucose: Secondary | ICD-10-CM

## 2019-07-28 LAB — GLUCOSE, CAPILLARY
Glucose-Capillary: 116 mg/dL — ABNORMAL HIGH (ref 70–99)
Glucose-Capillary: 110 mg/dL — ABNORMAL HIGH (ref 70–99)
Glucose-Capillary: 148 mg/dL — ABNORMAL HIGH (ref 70–99)
Glucose-Capillary: 306 mg/dL — ABNORMAL HIGH (ref 70–99)

## 2019-07-28 MED ORDER — AMLODIPINE BESYLATE 5 MG PO TABS
5.0000 mg | ORAL_TABLET | Freq: Every day | ORAL | Status: DC
  Administered 2019-07-29 – 2019-07-30 (×2): 5 mg via ORAL
  Filled 2019-07-28 (×2): qty 1

## 2019-07-28 NOTE — Plan of Care (Signed)
  Problem: RH BLADDER ELIMINATION Goal: RH STG MANAGE BLADDER WITH ASSISTANCE Description: STG Manage Bladder With Min Assistance Outcome: Progressing Goal: RH STG MANAGE BLADDER WITH EQUIPMENT WITH ASSISTANCE Description: STG Manage Bladder With Equipment With Min Assistance Outcome: Progressing   Problem: RH SKIN INTEGRITY Goal: RH STG SKIN FREE OF INFECTION/BREAKDOWN Description: No new breakdown with min assist  Outcome: Progressing   Problem: RH SAFETY Goal: RH STG ADHERE TO SAFETY PRECAUTIONS W/ASSISTANCE/DEVICE Description: STG Adhere to Safety Precautions With Min Assistance/Device. Outcome: Progressing

## 2019-07-28 NOTE — Progress Notes (Signed)
[place] PHYSICAL MEDICINE & REHABILITATION PROGRESS NOTE   Subjective/Complaints: Patient seen sitting up in bed this morning.  He states he slept well overnight.  He denies complaints.  He states "just pray for me".  ROS: Denies CP, SOB, N/V/D  Objective:   No results found. No results for input(s): WBC, HGB, HCT, PLT in the last 72 hours. Recent Labs    07/25/19 1124  CREATININE 0.94    Intake/Output Summary (Last 24 hours) at 07/28/2019 1026 Last data filed at 07/28/2019 0948 Gross per 24 hour  Intake 240 ml  Output 2 ml  Net 238 ml     Physical Exam: Vital Signs Blood pressure (!) 155/94, pulse 67, temperature (!) 97.5 F (36.4 C), temperature source Oral, resp. rate 16, height 6' (1.829 m), weight 97.6 kg, SpO2 100 %. Constitutional: No distress . Vital signs reviewed. HENT: Normocephalic.  Atraumatic. Eyes: EOMI. No discharge. Cardiovascular: No JVD. Respiratory: Normal effort.  No stridor. GI: Non-distended. Skin: Warm and dry.  Intact. Psych: Normal mood.  Normal behavior. Musc: No edema in extremities.  No tenderness in extremities. Neuro: Alert Motor: Grossly 4+/5 throughout  Assessment/Plan: 1. Functional deficits secondary to acute left corona radiata, centrum semiovale which require 3+ hours per day of interdisciplinary therapy in a comprehensive inpatient rehab setting.  Physiatrist is providing close team supervision and 24 hour management of active medical problems listed below.  Physiatrist and rehab team continue to assess barriers to discharge/monitor patient progress toward functional and medical goals  Care Tool:  Bathing    Body parts bathed by patient: Right arm, Left arm, Chest, Abdomen, Front perineal area, Buttocks, Right upper leg, Left upper leg, Right lower leg, Left lower leg, Face   Body parts bathed by helper: Buttocks, Right lower leg, Left lower leg Body parts n/a: Left lower leg, Right lower leg, Left upper leg, Right upper  leg, Abdomen, Chest, Left arm, Right arm, Face(did not attempt this session)   Bathing assist Assist Level: Contact Guard/Touching assist     Upper Body Dressing/Undressing Upper body dressing   What is the patient wearing?: Pull over shirt    Upper body assist Assist Level: Supervision/Verbal cueing    Lower Body Dressing/Undressing Lower body dressing      What is the patient wearing?: Pants, Incontinence brief     Lower body assist Assist for lower body dressing: Minimal Assistance - Patient > 75%     Toileting Toileting    Toileting assist Assist for toileting: Minimal Assistance - Patient > 75%     Transfers Chair/bed transfer  Transfers assist     Chair/bed transfer assist level: Contact Guard/Touching assist Chair/bed transfer assistive device: Programmer, multimedia   Ambulation assist      Assist level: Minimal Assistance - Patient > 75% Assistive device: No Device Max distance: 50'   Walk 10 feet activity   Assist     Assist level: Contact Guard/Touching assist Assistive device: Walker-rolling   Walk 50 feet activity   Assist Walk 50 feet with 2 turns activity did not occur: Safety/medical concerns  Assist level: Contact Guard/Touching assist Assistive device: Walker-rolling    Walk 150 feet activity   Assist Walk 150 feet activity did not occur: Safety/medical concerns  Assist level: Minimal Assistance - Patient > 75% Assistive device: Walker-rolling    Walk 10 feet on uneven surface  activity   Assist Walk 10 feet on uneven surfaces activity did not occur: Safety/medical concerns  Wheelchair     Assist Will patient use wheelchair at discharge?: No(No PT long term goals set)             Wheelchair 50 feet with 2 turns activity    Assist            Wheelchair 150 feet activity     Assist          Blood pressure (!) 155/94, pulse 67, temperature (!) 97.5 F (36.4 C),  temperature source Oral, resp. rate 16, height 6' (1.829 m), weight 97.6 kg, SpO2 100 %.  Medical Problem List and Plan: 1.  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  Antithrombotics: -DVT/anticoagulation: Lovenox             -antiplatelet therapy: Aspirin 325 mg daily and Plavix 75 mg daily x3 months then Plavix alone 3. Pain Management: Tylenol as needed 4. Mood: Zoloft 25 mg daily             -antipsychotic agents: N/A 5. Neuropsych: This patient is capable of making decisions on his own behalf. 6. Skin/Wound Care: Routine skin checks 7. Fluids/Electrolytes/Nutrition: Routine in and outs.   8. Hypertension.  Patient on Norvasc 5 mg daily, Lasix 20 mg daily, HCTZ 25 mg daily, Cozaar 100 mg daily prior to admission.  Resume as needed          Vitals:   07/27/19 2105 07/28/19 0423  BP: 135/69 (!) 155/94  Pulse: 65 67  Resp: 18 16  Temp: 98.1 F (36.7 C) (!) 97.5 F (36.4 C)  SpO2: 99% 100%   Norvasc started on 6/5 10.  Diabetes mellitus with hyperglycemia.  Latest hemoglobin A1c 7.2.  SSI.  Patient on Glucotrol 2.5 mg and Glucophage 500 mg twice daily and Ozempic 0.25 mg weekly prior to admission.  Resume as needed CBG (last 3)  Recent Labs    07/27/19 1731 07/27/19 2059 07/28/19 0609  GLUCAP 172* 206* 116*   Labile on 6/5  Glucotrol 2.5mg , daily 11.  Hyperlipidemia.  Lipitor/fenofibrate 12. OSA:  Continue CPAP 13.  RIght eye injected but no drainage, asked pt to use his mositurizing drops OU  LOS: 10 days A FACE TO FACE EVALUATION WAS PERFORMED  Seletha Zimmermann Lorie Phenix 07/28/2019, 10:26 AM

## 2019-07-28 NOTE — Progress Notes (Signed)
RT came to place patient on CPAP HS. Patient is already on CPAP. Patient able to place himself on and off when ready.

## 2019-07-29 ENCOUNTER — Inpatient Hospital Stay (HOSPITAL_COMMUNITY): Payer: Medicare HMO | Admitting: Physical Therapy

## 2019-07-29 LAB — GLUCOSE, CAPILLARY
Glucose-Capillary: 106 mg/dL — ABNORMAL HIGH (ref 70–99)
Glucose-Capillary: 89 mg/dL (ref 70–99)
Glucose-Capillary: 141 mg/dL — ABNORMAL HIGH (ref 70–99)
Glucose-Capillary: 264 mg/dL — ABNORMAL HIGH (ref 70–99)

## 2019-07-29 NOTE — Plan of Care (Signed)
  Problem: Consults Goal: RH STROKE PATIENT EDUCATION Description: See Patient Education module for education specifics  Outcome: Progressing   Problem: RH BLADDER ELIMINATION Goal: RH STG MANAGE BLADDER WITH ASSISTANCE Description: STG Manage Bladder With Min Assistance Outcome: Progressing Goal: RH STG MANAGE BLADDER WITH EQUIPMENT WITH ASSISTANCE Description: STG Manage Bladder With Equipment With Min Assistance Outcome: Progressing

## 2019-07-29 NOTE — Progress Notes (Signed)
[place] PHYSICAL MEDICINE & REHABILITATION PROGRESS NOTE   Subjective/Complaints: Patient seen sitting up in bed this morning.  He states he slept well overnight.  Denies complaints.  ROS: Denies CP, SOB, N/V/D  Objective:   No results found. No results for input(s): WBC, HGB, HCT, PLT in the last 72 hours. No results for input(s): NA, K, CL, CO2, GLUCOSE, BUN, CREATININE, CALCIUM in the last 72 hours.  Intake/Output Summary (Last 24 hours) at 07/29/2019 1302 Last data filed at 07/29/2019 1300 Gross per 24 hour  Intake 780 ml  Output 100 ml  Net 680 ml     Physical Exam: Vital Signs Blood pressure (!) 155/75, pulse (!) 56, temperature 97.6 F (36.4 C), resp. rate 16, height 6' (1.829 m), weight 98.6 kg, SpO2 100 %. Constitutional: No distress . Vital signs reviewed. HENT: Normocephalic.  Atraumatic. Eyes: EOMI. No discharge. Cardiovascular: No JVD. Respiratory: Normal effort.  No stridor. GI: Non-distended. Skin: Warm and dry.  Intact. Psych: Normal mood.  Normal behavior. Musc: No edema in extremities.  No tenderness in extremities. Neuro: Alert Motor: Grossly 4+/5 throughout, unchanged  Assessment/Plan: 1. Functional deficits secondary to acute left corona radiata, centrum semiovale which require 3+ hours per day of interdisciplinary therapy in a comprehensive inpatient rehab setting.  Physiatrist is providing close team supervision and 24 hour management of active medical problems listed below.  Physiatrist and rehab team continue to assess barriers to discharge/monitor patient progress toward functional and medical goals  Care Tool:  Bathing    Body parts bathed by patient: Right arm, Left arm, Chest, Abdomen, Front perineal area, Buttocks, Right upper leg, Left upper leg, Right lower leg, Left lower leg, Face   Body parts bathed by helper: Buttocks, Right lower leg, Left lower leg Body parts n/a: Left lower leg, Right lower leg, Left upper leg, Right upper leg,  Abdomen, Chest, Left arm, Right arm, Face(did not attempt this session)   Bathing assist Assist Level: Contact Guard/Touching assist     Upper Body Dressing/Undressing Upper body dressing   What is the patient wearing?: Pull over shirt    Upper body assist Assist Level: Supervision/Verbal cueing    Lower Body Dressing/Undressing Lower body dressing      What is the patient wearing?: Pants, Incontinence brief     Lower body assist Assist for lower body dressing: Minimal Assistance - Patient > 75%     Toileting Toileting    Toileting assist Assist for toileting: Minimal Assistance - Patient > 75%     Transfers Chair/bed transfer  Transfers assist     Chair/bed transfer assist level: Contact Guard/Touching assist Chair/bed transfer assistive device: Programmer, multimedia   Ambulation assist      Assist level: Contact Guard/Touching assist Assistive device: Walker-rolling Max distance: 50   Walk 10 feet activity   Assist     Assist level: Contact Guard/Touching assist Assistive device: Walker-rolling   Walk 50 feet activity   Assist Walk 50 feet with 2 turns activity did not occur: Safety/medical concerns  Assist level: Contact Guard/Touching assist Assistive device: Walker-rolling    Walk 150 feet activity   Assist Walk 150 feet activity did not occur: Safety/medical concerns  Assist level: Minimal Assistance - Patient > 75% Assistive device: Walker-rolling    Walk 10 feet on uneven surface  activity   Assist Walk 10 feet on uneven surfaces activity did not occur: Safety/medical concerns         Wheelchair  Assist Will patient use wheelchair at discharge?: No(No PT long term goals set)             Wheelchair 50 feet with 2 turns activity    Assist            Wheelchair 150 feet activity     Assist          Blood pressure (!) 155/75, pulse (!) 56, temperature 97.6 F (36.4 C), resp. rate  16, height 6' (1.829 m), weight 98.6 kg, SpO2 100 %.  Medical Problem List and Plan: 1.  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  Antithrombotics: -DVT/anticoagulation: Lovenox             -antiplatelet therapy: Aspirin 325 mg daily and Plavix 75 mg daily x3 months then Plavix alone 3. Pain Management: Tylenol as needed 4. Mood: Zoloft 25 mg daily             -antipsychotic agents: N/A 5. Neuropsych: This patient is capable of making decisions on his own behalf. 6. Skin/Wound Care: Routine skin checks 7. Fluids/Electrolytes/Nutrition: Routine in and outs.   8. Hypertension.  Patient on Norvasc 5 mg daily, Lasix 20 mg daily, HCTZ 25 mg daily, Cozaar 100 mg daily prior to admission.  Resume as needed          Vitals:   07/28/19 1933 07/29/19 0517  BP: 137/70 (!) 155/75  Pulse: 67 (!) 56  Resp: 16 16  Temp: 98.3 F (36.8 C) 97.6 F (36.4 C)  SpO2: 100% 100%   Norvasc started on 6/5  Remains elevated on 6/6, we will not make further adjustments today given addition of new medication yesterday 10.  Diabetes mellitus with hyperglycemia.  Latest hemoglobin A1c 7.2.  SSI.  Patient on Glucotrol 2.5 mg and Glucophage 500 mg twice daily and Ozempic 0.25 mg weekly prior to admission.  Resume as needed CBG (last 3)  Recent Labs    07/28/19 2058 07/29/19 0612 07/29/19 1202  GLUCAP 306* 106* 89   Glucotrol 2.5mg , daily  Labile on 6/6 11.  Hyperlipidemia.  Lipitor/fenofibrate 12. OSA:  Continue CPAP 13.  RIght eye injected but no drainage, asked pt to use his mositurizing drops OU  LOS: 11 days A FACE TO FACE EVALUATION WAS PERFORMED  Rakesha Dalporto Lorie Phenix 07/29/2019, 1:02 PM

## 2019-07-29 NOTE — Progress Notes (Signed)
Physical Therapy Session Note  Patient Details  Name: Justin [name] MRN: [id number] Date of Birth: Nov 08, 1940  Today's Date: 07/29/2019 PT Individual Time: 1131-1200 PT Individual Time Calculation (min): 29 min   Short Term Goals: Week 2:  PT Short Term Goal 1 (Week 2): = to LTGs based on ELOS  Skilled Therapeutic Interventions/Progress Updates: Pt presents sitting in w/c and agreeable to therapy. Pt required CGA for initial sit to stand transfer, rolling back on heels, but verbal cues for scoot forward and required supervision only.   Pt amb multiple trials w/ RW and CGA, mostly to increase left weight-shift to advance RLE.  Pt initiated gait w/ foot clearance to R, but then gradually starts shuffling and leaving behind as fatigues.  Pt gait cycle decreases w/ turns to return to seat, w/ increased shuffling, and decreased foot clearance and step length R.  Pt amb 50' including turns to return to seat.  Pt returned to room and remained in w/c w/ chair alarm on and all needs in reach.     Therapy Documentation Precautions:  Precautions Precautions: Fall Precaution Comments: Slight R lateral lean, right side weakness Restrictions Weight Bearing Restrictions: No General:   Vital Signs: no c/o pain Pain:   Mobility:      Therapy/Group: Individual Therapy  Ladoris Gene 07/29/2019, 12:56 PM

## 2019-07-30 ENCOUNTER — Inpatient Hospital Stay (HOSPITAL_COMMUNITY): Payer: Medicare HMO

## 2019-07-30 ENCOUNTER — Inpatient Hospital Stay (HOSPITAL_COMMUNITY): Payer: Medicare HMO | Admitting: Physical Therapy

## 2019-07-30 ENCOUNTER — Inpatient Hospital Stay (HOSPITAL_COMMUNITY): Payer: Medicare HMO | Admitting: Occupational Therapy

## 2019-07-30 LAB — GLUCOSE, CAPILLARY
Glucose-Capillary: 114 mg/dL — ABNORMAL HIGH (ref 70–99)
Glucose-Capillary: 127 mg/dL — ABNORMAL HIGH (ref 70–99)
Glucose-Capillary: 117 mg/dL — ABNORMAL HIGH (ref 70–99)
Glucose-Capillary: 136 mg/dL — ABNORMAL HIGH (ref 70–99)

## 2019-07-30 NOTE — Progress Notes (Signed)
Physical Therapy Session Note  Patient Details  Name: Justin [name] MRN: [id number] Date of Birth: 08-10-1940  Today's Date: 07/30/2019 PT Individual Time: 1602-1659 PT Individual Time Calculation (min): 57 min   Short Term Goals: Week 2:  PT Short Term Goal 1 (Week 2): = to LTGs based on ELOS  Skilled Therapeutic Interventions/Progress Updates:    Pt seated in w/c upon PT arrival, agreeable to therapy tx and denies pain. Pt transported to the dayroom in w/c. Pt ambulated from w/c<>nustep this session 2 x 10 ft with RW and CGA, used nustep this session x 8 min for global strengthening and endurance on resistance level 5. Pt transported to the gym. Pt ambulated 2 x 50 ft with CGA-min assist, manual facilitation for L lateral weightshift during R swing and cues for increased R step length. Pt worked on dynamic standing balance, coordination and L weigthshift while performing ball toss activity against rebound trampoline, 1 inch board under R foot to facilitate L weightshift, CGA - x 2 trials. Pt worked on Dietitian and weigthshifting to perform lateral weigthshifts on rockerboard x 2 trials with CGA-min assist and no UE support. Pt worked on gait training, ambulated x 160 ft with RW and CGA, cues for R foot clearance and manual facilitation for weightshift, poor clearance during turn to sit and decreased gait speed. Pt worked on dynamic balance and foot clearance to perform toe taps on aerobic step without UE support, x 2 trials with min assist and cues for techniques. Pt ambulated back to w/c and transported back to room, left seated in w/c with needs in reach and chair alarm set. Wife present.   Therapy Documentation Precautions:  Precautions Precautions: Fall Precaution Comments: Slight R lateral lean, right side weakness Restrictions Weight Bearing Restrictions: No    Therapy/Group: Individual Therapy  Netta Corrigan, PT, DPT, CSRS 07/30/2019, 4:22 PM

## 2019-07-30 NOTE — Progress Notes (Signed)
[facility] PHYSICAL MEDICINE & REHABILITATION PROGRESS NOTE   Subjective/Complaints: .  Seen in PT, working on ambulation   ROS: Denies CP, SOB, N/V/D  Objective:   No results found. No results for input(s): WBC, HGB, HCT, PLT in the last 72 hours. No results for input(s): NA, K, CL, CO2, GLUCOSE, BUN, CREATININE, CALCIUM in the last 72 hours.  Intake/Output Summary (Last 24 hours) at 07/30/2019 1001 Last data filed at 07/29/2019 2113 Gross per 24 hour  Intake 480 ml  Output 150 ml  Net 330 ml     Physical Exam: Vital Signs Blood pressure (!) 147/93, pulse (!) 58, temperature 97.7 F (36.5 C), temperature source Oral, resp. rate 16, height 6' (1.829 m), weight 97.7 kg, SpO2 99 %. Constitutional: No distress . Vital signs reviewed. HENT: Normocephalic.  Atraumatic. Eyes: EOMI. No discharge. Cardiovascular: No JVD. Respiratory: Normal effort.  No stridor. GI: Non-distended. Skin: Warm and dry.  Intact. Psych: Normal mood.  Normal behavior. Musc: No edema in extremities.  No tenderness in extremities. Neuro: Alert Motor: Grossly 4+/5 throughout, unchanged  Assessment/Plan: 1. Functional deficits secondary to acute left corona radiata, centrum semiovale which require 3+ hours per day of interdisciplinary therapy in a comprehensive inpatient rehab setting.  Physiatrist is providing close team supervision and 24 hour management of active medical problems listed below.  Physiatrist and rehab team continue to assess barriers to discharge/monitor patient progress toward functional and medical goals  Care Tool:  Bathing    Body parts bathed by patient: Right arm, Left arm, Chest, Abdomen, Front perineal area, Buttocks, Right upper leg, Left upper leg, Right lower leg, Left lower leg, Face   Body parts bathed by helper: Buttocks, Right lower leg, Left lower leg Body parts n/a: Left lower leg, Right lower leg, Left upper leg, Right upper leg, Abdomen, Chest, Left arm, Right arm,  Face(did not attempt this session)   Bathing assist Assist Level: Contact Guard/Touching assist     Upper Body Dressing/Undressing Upper body dressing   What is the patient wearing?: Pull over shirt    Upper body assist Assist Level: Supervision/Verbal cueing    Lower Body Dressing/Undressing Lower body dressing      What is the patient wearing?: Pants, Incontinence brief     Lower body assist Assist for lower body dressing: Minimal Assistance - Patient > 75%     Toileting Toileting    Toileting assist Assist for toileting: Minimal Assistance - Patient > 75%     Transfers Chair/bed transfer  Transfers assist     Chair/bed transfer assist level: Contact Guard/Touching assist Chair/bed transfer assistive device: Programmer, multimedia   Ambulation assist      Assist level: Contact Guard/Touching assist Assistive device: Walker-rolling Max distance: 50   Walk 10 feet activity   Assist     Assist level: Contact Guard/Touching assist Assistive device: Walker-rolling   Walk 50 feet activity   Assist Walk 50 feet with 2 turns activity did not occur: Safety/medical concerns  Assist level: Contact Guard/Touching assist Assistive device: Walker-rolling    Walk 150 feet activity   Assist Walk 150 feet activity did not occur: Safety/medical concerns  Assist level: Minimal Assistance - Patient > 75% Assistive device: Walker-rolling    Walk 10 feet on uneven surface  activity   Assist Walk 10 feet on uneven surfaces activity did not occur: Safety/medical concerns         Wheelchair     Assist Will patient use wheelchair  at discharge?: No(No PT long term goals set)             Wheelchair 50 feet with 2 turns activity    Assist            Wheelchair 150 feet activity     Assist          Blood pressure (!) 147/93, pulse (!) 58, temperature 97.7 F (36.5 C), temperature source Oral, resp. rate 16, height  6' (1.829 m), weight 97.7 kg, SpO2 99 %.  Medical Problem List and Plan: 1.  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  Antithrombotics: -DVT/anticoagulation: Lovenox             -antiplatelet therapy: Aspirin 325 mg daily and Plavix 75 mg daily x3 months then Plavix alone 3. Pain Management: Tylenol as needed 4. Mood: Zoloft 25 mg daily             -antipsychotic agents: N/A 5. Neuropsych: This patient is capable of making decisions on his own behalf. 6. Skin/Wound Care: Routine skin checks 7. Fluids/Electrolytes/Nutrition: Routine in and outs.   8. Hypertension.  Patient on Norvasc 5 mg daily, Lasix 20 mg daily, HCTZ 25 mg daily, Cozaar 100 mg daily prior to admission.  Resume as needed          Vitals:   07/29/19 1936 07/30/19 0500  BP: (!) 143/66 (!) 147/93  Pulse: 64 (!) 58  Resp: 18 16  Temp: 98.5 F (36.9 C) 97.7 F (36.5 C)  SpO2: 100% 99%   Norvasc started on 6/5  Remains elevated on 6/7, we will not make further adjustments today given addition of new medication 6/5  10.  Diabetes mellitus with hyperglycemia.  Latest hemoglobin A1c 7.2.  SSI.  Patient on Glucotrol 2.5 mg and Glucophage 500 mg twice daily and Ozempic 0.25 mg weekly prior to admission.  Resume as needed CBG (last 3)  Recent Labs    07/29/19 1641 07/29/19 2107 07/30/19 0613  GLUCAP 141* 264* 114*   Glucotrol 2.5mg , daily   11.  Hyperlipidemia.  Lipitor/fenofibrate 12. OSA:  Continue CPAP 13.  RIght eye injected but no drainage, asked pt to use his mositurizing drops OU  LOS: 12 days A FACE TO FACE EVALUATION WAS PERFORMED  Charlett Blake 07/30/2019, 10:01 AM

## 2019-07-30 NOTE — Progress Notes (Signed)
Physical Therapy Session Note  Patient Details  Name: Justin [name] MRN: [id number] Date of Birth: January 31, 1941  Today's Date: 07/30/2019 PT Individual Time: 0802-0844 PT Individual Time Calculation (min): 42 min   Short Term Goals: Week 2:  PT Short Term Goal 1 (Week 2): = to LTGs based on ELOS  Skilled Therapeutic Interventions/Progress Updates:    Patient received in bed, pleasant and willing to participate in therapy today. Able to bridge to don shorts in bed, then required MinA for all bed mobility, otherwise able to perform functional transfers with S-min guard with RW and gait approximately 93f with RW/min guard today. Demonstrated heavy posterior lean at EOB when attempting to dynamically doff hospital gown/don new shirt and needed Max cues/ModA to maintain balance. Continues to be very easily distracted and needed Mod cues to attend to tasks consistently. Tolerated riding nustep 8 minutes at resistance 5 with BUEs/BLEs, then gait trained approximately 652fbut with great difficulty today advancing R foot past left and using basically a stutter-step pattern- gait distance limited by therapist due to poor gait mechanics and fatigue this morning. Otherwise continued working on L patella with lateral to medial and superior <-> inferior mobilizations. [facility], safety seatbelt active.   Therapy Documentation Precautions:  Precautions Precautions: Fall Precaution Comments: Slight R lateral lean, right side weakness Restrictions Weight Bearing Restrictions: No   Pain: Pain Assessment Pain Scale: 0-10 Pain Score: 0-No pain    Therapy/Group: Individual Therapy   KrWindell NorfolkDPT, PN1   Supplemental Physical Therapist [place]  Pager [phone] [phone]  07/30/2019, 12:14 PM

## 2019-07-30 NOTE — Progress Notes (Signed)
Physical Therapy Session Note  Patient Details  Name: Justin [name] MRN: [id number] Date of Birth: 24-Jul-1940  Today's Date: 07/30/2019 PT Individual Time: 0950-1030 PT Individual Time Calculation (min): 40 min   Short Term Goals: Week 2:  PT Short Term Goal 1 (Week 2): = to LTGs based on ELOS  Skilled Therapeutic Interventions/Progress Updates: Pt presented in w/c agreeable to therapy. Pt denies pain during session. Pt transported to rehab gym for energy conservation. Performed ambulatory transfer to mat with CGA. Participated in toe taps to 4 in step for forced use of RLE and increased hip flexion 2 x 10 with verbal cues for increased hip flexion to clear foot from step when returning to level tile. Pt participated in ambulation with RW weaving through cones an stepping over walking stick. Pt required mod verbal cues for improving R foot clearance and intermittently advancing RLE due to decreased attention. Pt also required verbal cues to safety with DME when stepping over walking stick. After seated rest at mat upon standing pt noted to be incontinent of bladder. Performed stand pivot to w/c and transported back to room. Pt ambulated to bathroom with RW and CGA with cues for increased clearance of RLE. Performed toilet transfers with CGA (+BM). Pt stood at toilet with CGA and PTA performed peri-care total A. PTA donned new brief and pt ambulated back to w/c CGA with RW. At w/c PTA threaded shorts and pt performed STS to complete pulling shorts over hips. Pt left in w/c at end of session with belt alarm on, call bell within reach and needs met.      Therapy Documentation Precautions:  Precautions Precautions: Fall Precaution Comments: Slight R lateral lean, right side weakness Restrictions Weight Bearing Restrictions: No General:   Vital Signs: Therapy Vitals Temp: 98.6 F (37 C) Pulse Rate: 63 BP: 135/74 Patient Position (if appropriate): Sitting Oxygen Therapy SpO2: 100  % Pain: Pain Assessment Pain Scale: 0-10 Pain Score: 0-No pain Mobility:   Locomotion :    Trunk/Postural Assessment :    Balance:   Exercises:   Other Treatments:      Therapy/Group: Individual Therapy  Christinea Brizuela 07/30/2019, 4:11 PM

## 2019-07-30 NOTE — Progress Notes (Signed)
Occupational Therapy Session Note  Patient Details  Name: Justin [name] MRN: [id number] Date of Birth: 07-05-1940  Today's Date: 07/30/2019 OT Individual Time: 1300-1400 OT Individual Time Calculation (min): 60 min   Short Term Goals: Week 2:  OT Short Term Goal 1 (Week 2): Continue working on established LTGs set at supervision overall.  Skilled Therapeutic Interventions/Progress Updates:    Pt greeted in the w/c with no c/o pain. Declining shower, other ADL needs met. He wanted to walk during tx. Therefore pt was taken to the community areas of the hospital. He ambulated in the outdoor patio and gift shop using [place] with [place]. Pt required max multimodal cues to improve Rt foot drag, especially going over uneven concrete and brick surfaces outside. He transferred to a community bench with CGA and vcs. While in the atrium, worked on B UE strengthening by propelling the w/c. Pt needed verbal, demonstrational, and tactile cues for larger more efficient pushes and cues for how to avoid obstacles safely on the Rt side. He tended to try to push obstacles away vs problem solve how to maneuver around them. At end of session pt was returned to room via w/c. Left him with all needs within reach and safety belt fastened. Tx focus placed on dynamic standing balance, Rt NMR, and general strengthening and endurance.      Therapy Documentation Precautions:  Precautions Precautions: Fall Precaution Comments: Slight R lateral lean, right side weakness Restrictions Weight Bearing Restrictions: No Vital Signs:  Pain: Pain Assessment Pain Scale: 0-10 Pain Score: 0-No pain ADL:       Therapy/Group: Individual Therapy  Kailan Carmen A Vivienne Sangiovanni 07/30/2019, 2:25 PM

## 2019-07-31 ENCOUNTER — Inpatient Hospital Stay (HOSPITAL_COMMUNITY): Payer: Medicare HMO | Admitting: Occupational Therapy

## 2019-07-31 ENCOUNTER — Inpatient Hospital Stay (HOSPITAL_COMMUNITY): Payer: Medicare HMO | Admitting: Physical Therapy

## 2019-07-31 ENCOUNTER — Inpatient Hospital Stay (HOSPITAL_COMMUNITY): Payer: Medicare HMO

## 2019-07-31 LAB — GLUCOSE, CAPILLARY
Glucose-Capillary: 103 mg/dL — ABNORMAL HIGH (ref 70–99)
Glucose-Capillary: 125 mg/dL — ABNORMAL HIGH (ref 70–99)
Glucose-Capillary: 110 mg/dL — ABNORMAL HIGH (ref 70–99)
Glucose-Capillary: 229 mg/dL — ABNORMAL HIGH (ref 70–99)

## 2019-07-31 MED ORDER — AMLODIPINE BESYLATE 10 MG PO TABS: 10.0000 mg | ORAL_TABLET | Freq: Every day | ORAL | Status: DC

## 2019-07-31 MED ORDER — HYDROCHLOROTHIAZIDE 25 MG PO TABS
25.0000 mg | ORAL_TABLET | Freq: Every day | ORAL | Status: DC
  Administered 2019-07-31 – 2019-08-04 (×5): 25 mg via ORAL
  Filled 2019-07-31 (×5): qty 1

## 2019-07-31 MED ORDER — AMLODIPINE BESYLATE 5 MG PO TABS
5.0000 mg | ORAL_TABLET | Freq: Every day | ORAL | Status: DC
  Administered 2019-07-31: 10 mg via ORAL
  Administered 2019-08-01 – 2019-08-04 (×4): 5 mg via ORAL
  Filled 2019-07-31 (×5): qty 1

## 2019-07-31 NOTE — Progress Notes (Signed)
[facility] PHYSICAL MEDICINE & REHABILITATION PROGRESS NOTE   Subjective/Complaints: .  Doing well with PT, increased swelling in LEs since last admit, no breathing issues during PT  Was on Lasix and HCTZ PTA  ROS: Denies CP, SOB, N/V/D  Objective:   No results found. No results for input(s): WBC, HGB, HCT, PLT in the last 72 hours. No results for input(s): NA, K, CL, CO2, GLUCOSE, BUN, CREATININE, CALCIUM in the last 72 hours.  Intake/Output Summary (Last 24 hours) at 07/31/2019 0845 Last data filed at 07/31/2019 0700 Gross per 24 hour  Intake 240 ml  Output 150 ml  Net 90 ml     Physical Exam: Vital Signs Blood pressure (!) 169/81, pulse (!) 55, temperature 97.9 F (36.6 C), temperature source Oral, resp. rate 18, height 6' (1.829 m), weight 96.4 kg, SpO2 100 %.  General: No acute distress Mood and affect are appropriate Heart: Regular rate and rhythm no rubs murmurs or extra sounds Lungs: Clear to auscultation, breathing unlabored, no rales or wheezes Abdomen: Positive bowel sounds, soft nontender to palpation, nondistended Extremities: 2+ edema Right LE, ade1+ left  Skin: No evidence of breakdown, no evidence of rash  Musculoskeletal: Full range of motion in all 4 extremities. No joint swelling Neuro: Alert Motor: Grossly 4+/5 throughout, unchanged  Assessment/Plan: 1. Functional deficits secondary to acute left corona radiata, centrum semiovale which require 3+ hours per day of interdisciplinary therapy in a comprehensive inpatient rehab setting.  Physiatrist is providing close team supervision and 24 hour management of active medical problems listed below.  Physiatrist and rehab team continue to assess barriers to discharge/monitor patient progress toward functional and medical goals  Care Tool:  Bathing    Body parts bathed by patient: Right arm, Left arm, Chest, Abdomen, Front perineal area, Buttocks, Right upper leg, Left upper leg, Right lower leg, Left  lower leg, Face   Body parts bathed by helper: Buttocks, Right lower leg, Left lower leg Body parts n/a: Left lower leg, Right lower leg, Left upper leg, Right upper leg, Abdomen, Chest, Left arm, Right arm, Face(did not attempt this session)   Bathing assist Assist Level: Contact Guard/Touching assist     Upper Body Dressing/Undressing Upper body dressing   What is the patient wearing?: Pull over shirt    Upper body assist Assist Level: Supervision/Verbal cueing    Lower Body Dressing/Undressing Lower body dressing      What is the patient wearing?: Pants, Incontinence brief     Lower body assist Assist for lower body dressing: Minimal Assistance - Patient > 75%     Toileting Toileting    Toileting assist Assist for toileting: Minimal Assistance - Patient > 75%     Transfers Chair/bed transfer  Transfers assist     Chair/bed transfer assist level: Supervision/Verbal cueing Chair/bed transfer assistive device: Programmer, multimedia   Ambulation assist      Assist level: Contact Guard/Touching assist Assistive device: Walker-rolling Max distance: 160 ft   Walk 10 feet activity   Assist     Assist level: Contact Guard/Touching assist Assistive device: Walker-rolling   Walk 50 feet activity   Assist Walk 50 feet with 2 turns activity did not occur: Safety/medical concerns  Assist level: Contact Guard/Touching assist Assistive device: Walker-rolling    Walk 150 feet activity   Assist Walk 150 feet activity did not occur: Safety/medical concerns  Assist level: Contact Guard/Touching assist Assistive device: Walker-rolling    Walk 10 feet on uneven surface  activity   Assist Walk 10 feet on uneven surfaces activity did not occur: Safety/medical concerns         Wheelchair     Assist Will patient use wheelchair at discharge?: No(No PT long term goals set)             Wheelchair 50 feet with 2 turns  activity    Assist            Wheelchair 150 feet activity     Assist          Blood pressure (!) 169/81, pulse (!) 55, temperature 97.9 F (36.6 C), temperature source Oral, resp. rate 18, height 6' (1.829 m), weight 96.4 kg, SpO2 100 %.  Medical Problem List and Plan: 1.  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  Antithrombotics: -DVT/anticoagulation: Lovenox             -antiplatelet therapy: Aspirin 325 mg daily and Plavix 75 mg daily x3 months then Plavix alone 3. Pain Management: Tylenol as needed 4. Mood: Zoloft 25 mg daily             -antipsychotic agents: N/A 5. Neuropsych: This patient is capable of making decisions on his own behalf. 6. Skin/Wound Care: Routine skin checks 7. Fluids/Electrolytes/Nutrition: Routine in and outs.  , check BMET in am , may need KCL supplement 8. Hypertension.  Patient on Norvasc 5 mg daily, Lasix 20 mg daily, HCTZ 25 mg daily, Cozaar 100 mg daily prior to admission.  Resume as needed          Vitals:   07/30/19 1921 07/31/19 0410  BP: (!) 156/70 (!) 169/81  Pulse: 61 (!) 55  Resp: 16 18  Temp: 97.6 F (36.4 C) 97.9 F (36.6 C)  SpO2: 100% 100%   Norvasc started on 6/5  Remains elevated on 6/7, we will not make further adjustments today given addition of new medication 6/8, restart HCTZ  10.  Diabetes mellitus with hyperglycemia.  Latest hemoglobin A1c 7.2.  SSI.  Patient on Glucotrol 2.5 mg and Glucophage 500 mg twice daily and Ozempic 0.25 mg weekly prior to admission.  Resume as needed CBG (last 3)  Recent Labs    07/30/19 1724 07/30/19 2100 07/31/19 0626  GLUCAP 117* 136* 103*   Glucotrol 2.5mg , daily controlled    11.  Hyperlipidemia.   Lipitor/fenofibrate 12. OSA:  Continue CPAP 13.  RIght eye injected but no drainage, asked pt to use his mositurizing drops OU  LOS: 13 days A FACE TO FACE EVALUATION WAS PERFORMED  Charlett Blake 07/31/2019, 8:45 AM

## 2019-07-31 NOTE — Progress Notes (Signed)
Occupational Therapy Session Note  Patient Details  Name: Justin [name] MRN: [id number] Date of Birth: 02-18-1941  Today's Date: 07/31/2019 OT Individual Time: 1300-1410 OT Individual Time Calculation (min): 70 min    Short Term Goals: Week 2:  OT Short Term Goal 1 (Week 2): Continue working on established LTGs set at supervision overall.  Skilled Therapeutic Interventions/Progress Updates:    Pt received sitting up in the w/c with no c/o pain. Pt was taken to the therapy gym via w/c and he transferred to the mat with CGA. Pt stood and completed functional stepping activity with the R and L LE, with activity graded with different levels of UE support. Pt required min A for standing balance without only 1 UE on RW. Pt also completed blocked practice sit <> stands with no UE support and functional reach with 2 kg ball. 3x 10 repetitions each exercise. Pt then stood and completed functional reaching with alternating R/LUE, crossing midline with no UE support on RW. Min guard provided throughout. Pt then completed boxing activity with punching bag placed anteriorly to pt, with cueing provided re forward reaching, upper cut, and crossing midline jab, all performed in order to challenge dynamic standing balance and functional activity tolerance. Pt then stood in the parallel bars and completed alternating LE abduction/extension to increase glute strength and core stabilization. Cueing provided for technique and proper muscle activation. Pt used RW to complete 2 trials of 75 ft of functional mobility with CGA. Moderate cueing required for R foot clearance and swing through. Pt completed standing level toileting with CGA. Pt was left sitting up in the w/c with all needs met, chair alarm set.   Therapy Documentation Precautions:  Precautions Precautions: Fall Precaution Comments: Slight R lateral lean, right side weakness Restrictions Weight Bearing Restrictions: No   Therapy/Group: Individual  Therapy  Curtis Sites 07/31/2019, 6:38 AM

## 2019-07-31 NOTE — Progress Notes (Signed)
Physical Therapy Session Note  Patient Details  Name: Justin [name] MRN: [id number] Date of Birth: 27-Sep-1940  Today's Date: 07/31/2019 PT Individual Time: 0807-0905 PT Individual Time Calculation (min): 58 min   Short Term Goals: Week 2:  PT Short Term Goal 1 (Week 2): = to LTGs based on ELOS  Skilled Therapeutic Interventions/Progress Updates:   Pt received supine in bed and agreeable to therapy session. Supine>sitting L EOB, HOB flat but using bedrails, with max cuing for logroll technique to increase pt independence and close supervision for safety - requires increased time to complete. Sitting EOB with supervision for safety, donned TED hose max assist, shorts and shirt with set-up and shoes with mod assist. Sit>stand low EOB>RW with CGA and cuing for increased anterior weight shift - while pulling pants up over hips had posterior LOB and sat back onto bed - returned to standing and with increased cuing and attention to task able to stop posterior lean/LOB. R stand pivot to w/c using RW with CGA - cuing for increased R lateral step length.  Transported to/from gym in w/c for time management and energy conservation.Gait training ~112ft using RW with CGA for steadying - continues to have initially improved gait with increased R LE step length and foot clearance but with fatigue or distraction shuffles R foot lacking clearance and inadequate step length. Performed circuit training including the following:  - gait training using RW through agility ladder x3 trials with CGA/min assist for steadying/AD management  - L LE step up on 1st step then R LE foot tap on 3rd step with B UE support on HRs while wearing 7lb weight on R LE then transitioned to no resistance as pt's R LE fatiguing x10 reps each - R LE standing hamstring curls against level 2 theraband resistance x10 reps using B UE support on stair handrails for balance Pt noted to be incontinent of bladder and it was leaking outside brief.  Transported back to room in w/c. Stand pivot w/c<>toilet using grab bars with CGA for steadying. Performed standing peri-care using UE support on grab bar as needed with set-up assist and close supervision for safety. In sitting, donned clean brief and shorts max assist for time management. Transported back to sink for seated hand hygiene. MD in/out for morning assessment - discussed edema in LEs. Pt left seated in w/c with needs in reach and seat belt alarm on.    Therapy Documentation Precautions:  Precautions Precautions: Fall Precaution Comments: Slight R lateral lean, right side weakness Restrictions Weight Bearing Restrictions: No  Pain:   Denies pain during session.   Therapy/Group: Individual Therapy  Tawana Scale, PT, DPT 07/31/2019, 7:52 AM

## 2019-07-31 NOTE — Progress Notes (Signed)
Physical Therapy Session Note  Patient Details  Name: Justin [name] MRN: [id number] Date of Birth: 05/05/1940  Today's Date: 07/31/2019 PT Individual Time: 1130-1153 PT Individual Time Calculation (min): 23 min   Short Term Goals: Week 1:  PT Short Term Goal 1 (Week 1): Pt will perform supine<>sit with CGA PT Short Term Goal 1 - Progress (Week 1): Progressing toward goal PT Short Term Goal 2 (Week 1): Pt will perform sit<>stands using LRAD with CGA PT Short Term Goal 2 - Progress (Week 1): Met PT Short Term Goal 3 (Week 1): Pt will perform stand pivot transfers using LRAD with min assist PT Short Term Goal 3 - Progress (Week 1): Met PT Short Term Goal 4 (Week 1): Pt will ambulate at least 50ft using LRAD with min assist PT Short Term Goal 4 - Progress (Week 1): Met PT Short Term Goal 5 (Week 1): Pt will initiate stair navigation PT Short Term Goal 5 - Progress (Week 1): Met Week 2:  PT Short Term Goal 1 (Week 2): = to LTGs based on ELOS  Skilled Therapeutic Interventions/Progress Updates:   Received pt sitting in WC, pt agreeable to therapy, and denied any pain during session. Session with emphasis on functional mobility/transfers, generalized strengthening, dynamic standing balance/coordination, ambulation, NMR, and improved activity tolerance. Pt transported to therapy gym in WC total assist for time managemnet. Pt ambulated 80ft with RW CGA with 1 minor LB due to R foot drag requiring min A to correct. Pt with significant R foot drag requiring max cues to increased R step length. Pt transferred sit<>stand and performed alternating heel taps to 3in step with min/mod A 2x10 without UE support with emphasis on promoting DF. Pt required multiple rest breaks throughout session due to increased fatigue. Pt transported back to room in WC total assist. Concluded session with pt sitting in WC, needs within reach, and seatbelt alarm on.   Therapy Documentation Precautions:   Precautions Precautions: Fall Precaution Comments: Slight R lateral lean, right side weakness Restrictions Weight Bearing Restrictions: No  Therapy/Group: Individual Therapy  M     PT, DPT   07/31/2019, 7:57 AM  

## 2019-07-31 NOTE — Progress Notes (Signed)
Occupational Therapy Session Note  Patient Details  Name: Justin [name] MRN: [id number] Date of Birth: 1940/11/30  Today's Date: 07/31/2019 OT Individual Time: 1005-1100 OT Individual Time Calculation (min): 55 min    Short Term Goals: Week 1:  OT Short Term Goal 1 (Week 1): Pt will complete toileting all aspects with Min A for transfer, clothing management, and hygiene. OT Short Term Goal 1 - Progress (Week 1): Met OT Short Term Goal 2 (Week 1): Pt will complete LB dressing with sit to stand with Min A OT Short Term Goal 2 - Progress (Week 1): Not met OT Short Term Goal 3 (Week 1): Pt will complete UB/LB bathing with tub bench with CGA using AE as needed OT Short Term Goal 3 - Progress (Week 1): Met OT Short Term Goal 4 (Week 1): Pt will perform functional transfers to various surfaces with CGA OT Short Term Goal 4 - Progress (Week 1): Not met OT Short Term Goal 5 (Week 1): Pt will improve [place] in RUE as evidenced by 15 second improvement on 9 hole peg OT Short Term Goal 5 - Progress (Week 1): Met Week 2:  OT Short Term Goal 1 (Week 2): Continue working on established LTGs set at supervision overall.  Skilled Therapeutic Interventions/Progress Updates:    1:1 Pt up in the w/c when arrived. Self care retraining: performing shaving at the sink in standing. Pt was able to stand for the entire time to complete task; although it did require more time. Pt was able to make postural adjustments without cues to correct balance.   In the gym focus on dynamic standing balance and strengthening and coordination of right LE. Pt performed step ups on the right only on 6 inch steps with UE support; then performed on the contralateral side. Transitioned to standing on foam wedge (downward and upward ) demonstrating more difficulty with maintaining balance standing with extreme ankle dorsiflexion (difficulty with maintaining forward balance to counteract position of ankles.)  Also performed drawing letters  with promotion of plantar flexion in the air with the right LE with difficulty with end range plantar flexion.   Pt demonstrated functional ambulation For ~50 feet with continued dragging right LE.  Left resting in the chair with chair alarm.   Therapy Documentation Precautions:  Precautions Precautions: Fall Precaution Comments: Slight R lateral lean, right side weakness Restrictions Weight Bearing Restrictions: No Pain:  no c/o pain in session   Therapy/Group: Individual Therapy  [facility] 07/31/2019, 11:44 AM

## 2019-08-01 ENCOUNTER — Inpatient Hospital Stay (HOSPITAL_COMMUNITY): Payer: Medicare HMO | Admitting: Occupational Therapy

## 2019-08-01 ENCOUNTER — Inpatient Hospital Stay (HOSPITAL_COMMUNITY): Payer: Medicare HMO | Admitting: Physical Therapy

## 2019-08-01 LAB — CREATININE, SERUM
Creatinine, Ser: 0.9 mg/dL (ref 0.61–1.24)
GFR calc Af Amer: >60 mL/min (ref >60)
GFR calc non Af Amer: >60 mL/min (ref >60)

## 2019-08-01 LAB — GLUCOSE, CAPILLARY
Glucose-Capillary: 121 mg/dL — ABNORMAL HIGH (ref 70–99)
Glucose-Capillary: 133 mg/dL — ABNORMAL HIGH (ref 70–99)
Glucose-Capillary: 117 mg/dL — ABNORMAL HIGH (ref 70–99)
Glucose-Capillary: 152 mg/dL — ABNORMAL HIGH (ref 70–99)

## 2019-08-01 NOTE — Patient Care Conference (Signed)
Inpatient RehabilitationTeam Conference and Plan of Care Update Date: 08/01/2019   Time: 1:53 PM    Patient Name: Justin [name]      Medical Record Number: [id number]  Date of Birth: 15-Jan-1941 Sex: Male         Room/Bed: 4W22C/4W22C-01 Payor Info: Payor: [employer] MEDICARE / Plan: Deal HMO / Product Type: *No Product type* /    Admit Date/Time:  07/18/2019  2:24 PM  Primary Diagnosis:  Left middle cerebral artery stroke [facility])  Patient Active Problem List   Diagnosis Date Noted  . Labile blood glucose   . 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 OSA (obstructive sleep apnea)   . Controlled type 2 diabetes mellitus with hyperglycemia, without long-term current use of insulin ([place])   . CVA (cerebral vascular accident) ([place]) 07/16/2019  . Dyslipidemia   . Benign essential HTN   . Diabetes mellitus type 2 in obese ([place])   . Major depressive disorder, recurrent episode, moderate ([place])   . Gait disorder   . Acute right MCA stroke ([place]) 06/28/2019  . Ambulatory dysfunction   . Stroke ([place]) 06/24/2019  . Type 2 diabetes mellitus ([place]) 10/04/2018  . Depression 10/04/2018  . Acute CVA (cerebrovascular accident) ([place]) 09/18/2017  . Stroke (cerebrum) ([place]) 09/17/2017  . Diabetes mellitus without complication ([place]) 67/89/3810  . Closed head injury 04/22/2013  . HLD (hyperlipidemia) 07/29/2009  . Essential hypertension 07/29/2009  . EFFUSION, PLEURAL 07/29/2009  . DIVERTICULITIS, COLON 07/29/2009  . 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 COLONIC POLYPS, HYPERPLASTIC, HX OF 07/29/2009    Expected Discharge Date: Expected Discharge Date: 08/04/19  Team Members Present: Physician leading conference: Dr. Alysia Penna Care Coodinator Present: Nestor Lewandowsky, RN, BSN, CRRN;Christina Sampson Goon, BSW Nurse Present: Serena Croissant, LPN PT Present: Page Spiro, PT OT Present: Clyda Greener, OT PPS Coordinator present : Ileana Ladd, PT     Current Status/Progress Goal  Weekly Team Focus  Bowel/Bladder   Continent of bowel and bladder LBM 6/8  Patient will maintain bowel and bladder continence and verbalizes toileting needs  assess toileting needs qshift and PRN   Swallow/Nutrition/ Hydration             ADL's   (S) UB ADLs, min A LB ADLs. Toilet and shower transfesr at [facility]. Improvements in balance and RUE use  supervision overall  self care retraining, balance, ADL transfers, NMR, family education   Mobility   min assist bed mobility, CGA sit<>stand and stand pivot transfers using RW, gait up to 178ft using RW with CGA, 4 steps using 1 HR with CGA  supervision, CGA for gait  activity tolerance, pt education, R LE strengthening and NMR, standing weight shifting, gait training, dynamic standing balance, attention/initiation   Communication             Safety/Cognition/ Behavioral Observations            Pain   No c/co pain  Patient will remain pain free with and without activity  assess pain qshift and PRN   Skin   skin intact  Patient will maintain skin integrity and be free of breakdown and infection  assess skin qshift and PRN    Rehab Goals Patient on target to meet rehab goals: Yes Rehab Goals Revised: patient on target with current goals *See Care Plan and progress notes for long and short-term goals.     Barriers to Discharge  Current Status/Progress Possible Resolutions Date Resolved   Nursing  PT                    OT                  SLP                Care Coordinator Medical stability              Discharge Planning/Teaching Needs:  Goal to discharge home with spouse  Will schedule if recommended   Team Discussion:  BP remains elevated and note LE edema- MD restarted diuretic as at home PTA. Continue to note about the same as before with addition of right side weakness. Overall supervision level  Revisions to Treatment Plan:  Goals downgraded by OT for lower body dressing    Medical Summary Current  Status: Blood pressures elevated, will extremity edema Weekly Focus/Goal: Resume diuretics monitor potassium  Barriers to Discharge: Medical stability   Possible Resolutions to Barriers: Resume hydrochlorothiazide may need to resume furosemide, monitor potassium levels with Bmet   Continued Need for Acute Rehabilitation Level of Care: The patient requires daily medical management by a physician with specialized training in physical medicine and rehabilitation for the following reasons: Direction of a multidisciplinary physical rehabilitation program to maximize functional independence : Yes Medical management of patient stability for increased activity during participation in an intensive rehabilitation regime.: Yes Analysis of laboratory values and/or radiology reports with any subsequent need for medication adjustment and/or medical intervention. : Yes   I attest that I was present, lead the team conference, and concur with the assessment and plan of the team.   Dorien Chihuahua B 08/01/2019, 1:53 PM

## 2019-08-01 NOTE — Plan of Care (Signed)
  Problem: Consults Goal: RH STROKE PATIENT EDUCATION Description: See Patient Education module for education specifics  Outcome: Progressing   Problem: RH BLADDER ELIMINATION Goal: RH STG MANAGE BLADDER WITH ASSISTANCE Description: STG Manage Bladder With Min Assistance Outcome: Progressing Goal: RH STG MANAGE BLADDER WITH EQUIPMENT WITH ASSISTANCE Description: STG Manage Bladder With Equipment With Min Assistance Outcome: Progressing   Problem: RH SKIN INTEGRITY Goal: RH STG SKIN FREE OF INFECTION/BREAKDOWN Description: No new breakdown with min assist  Outcome: Progressing   Problem: RH SAFETY Goal: RH STG ADHERE TO SAFETY PRECAUTIONS W/ASSISTANCE/DEVICE Description: STG Adhere to Safety Precautions With Min Assistance/Device. Outcome: Progressing   Problem: RH PAIN MANAGEMENT Goal: RH STG PAIN MANAGED AT OR BELOW PT'S PAIN GOAL Description: < 4 out of 10.  Outcome: Progressing   Problem: RH BOWEL ELIMINATION Goal: RH STG MANAGE BOWEL WITH ASSISTANCE Description: STG Manage Bowel with  Min Assistance. Outcome: Progressing Goal: RH STG MANAGE BOWEL W/MEDICATION W/ASSISTANCE Description: STG Manage Bowel with Medication with  mod I Assistance. Outcome: Progressing

## 2019-08-01 NOTE — Progress Notes (Signed)
Occupational Therapy Session Note  Patient Details  Name: MALICHI PALARDY MRN: [id number] Date of Birth: [birth year]  Today's Date: 08/01/2019 OT Individual Time: 1003-1030 OT Individual Time Calculation (min): 27 min    Short Term Goals: Week 2:  OT Short Term Goal 1 (Week 2): Continue working on established LTGs set at supervision overall.  Skilled Therapeutic Interventions/Progress Updates:    Session 1: ( 1003-1030)  Pt in wheelchair to start session.  Had him work on sit to stand and standing balance while engaged in reaching activity with use of the "Connect Four".  He worked on standing and stepping forward with the RLE while placing game pieces in the grid.  Decreased step length is noted on the right with min assist for balance when not using an assistive device.  He demonstrates forward trunk flexion in standing throughout with inability to maintain upright posture through his transitional movements.  LOB forward as well was noted.  He was able to tolerate standing for intervals of 2-3 mins before needing rest.  Finished session with pt in the wheelchair with call button and phone in reach and safety belt in place.    Session 2: (3810-1751) Pt completed shower transfer with use of the RW and min guard assist to begin session.  He continues to demonstrate decreased step length in the RLE with all transfers and mobility when using the RW for support.   He was able to complete all bathing with min guard assist sit to stand, except for washing his back, which therapist assisted with.  He was able to complete dressing sit to stand at the sink as well with overall supervision for UB and min assist for LB.  Therapist assisted with TEDS and then the pt was able to donn his slip on shoes with min assist.  He was then able to complete combing his hair with setup.  Therapist then took him down to the ADL apartment via wheelchair where he completed simulated walk-in shower transfer with min guard assist  and min instructional cueing for technique using the RW and shower seat.  Next, he was taken to the ortho gym where he complete 15 mins of BUE coordination and strengthening with use of the UE ergonometer.  Resistance was set on level 6 with program set on Random setting.  He was able to maintain RPMs at 21 or greater peddling forward for the 1st set and peddling backwards for the second set.  Each set was for 7.5 mins.  Therapist returned him to the room where he completed via wheelchair and he finished session with short distance mobility with use of the RW and min assist for left weightshift and right foot clearance for approximately 50'.  He was left in the wheelchair at end of session with call button and phone in reach and safety belt in place.    Therapy Documentation Precautions:  Precautions Precautions: Fall Precaution Comments: Slight R lateral lean, right side weakness Restrictions Weight Bearing Restrictions: No  Pain: Pain Assessment Pain Scale: Faces Pain Score: 0-No pain ADL: See Care Tool Section for some details of mobility and selfcare  Therapy/Group: Individual Therapy  Rider Ermis OTR/L 08/01/2019, 10:33 AM

## 2019-08-01 NOTE — Progress Notes (Signed)
Occupational Therapy Session Note  Patient Details  Name: Justin [name] MRN: [id number] Date of Birth: 1940-06-20  Today's Date: 08/01/2019 OT Individual Time: 0910-0950 OT Individual Time Calculation (min): 40 min    Short Term Goals: Week 2:  OT Short Term Goal 1 (Week 2): Continue working on established LTGs set at supervision overall.  Skilled Therapeutic Interventions/Progress Updates:    Pt greeted at time of session supine in bed agreeable to OT session to address ADLs in preparation for DC home. Pt declined showering, dressing, and toileting stating he had already showered today and just been to the bathroom but agreed to simulated tasks and transfers. Bed mobility to sit EOB with CGA with use of rails, donning slip on shoes Min A with attempts to utilize figure 4 method but required therapist assist d/t muscle tightness. Pt has difficulty forward weight shifting to reach feet for LB dressing but states wife will be able to help at home. Ambulated to bathroom CGA with RW, toilet transfer with CGA and simulated donning/doffing clothing in standing with unilateral and no UE support. Ambulated to the sink with RW with CGA and performed oral hygiene and grooming with supervision for standing balance but pt with good attending to both sides and crossing midline, no dropping of items with R hand. Once seated in chair pt performed several [facility] activities for RUE that required crossing midline, reaching and various heights/planes, and placing pegs into board with good accuracy and no dropping of pegs this date. Pt noted to drag R foot during mobility and had difficulty correcting with verbal cues, pt performed seated PF/DF exercises 2x10 to encourage picking up his foot during mobility. Pt up in chair alarm on, all needs met and call bell in reach.  Therapy Documentation Precautions:  Precautions Precautions: Fall Precaution Comments: Slight R lateral lean, right side weakness Restrictions Weight  Bearing Restrictions: No  Pain Assessment Pain Scale: Faces Pain Score: 0-No pain   Therapy/Group: Individual Therapy  Viona Gilmore 08/01/2019, 10:48 AM

## 2019-08-01 NOTE — Progress Notes (Signed)
[place] PHYSICAL MEDICINE & REHABILITATION PROGRESS NOTE   Subjective/Complaints: . No issues overnite, discussed tent D/C date , pt feel ready   Was on Lasix and HCTZ PTA  ROS: Denies CP, SOB, N/V/D  Objective:   No results found. No results for input(s): WBC, HGB, HCT, PLT in the last 72 hours. Recent Labs    08/01/19 0521  CREATININE 0.90    Intake/Output Summary (Last 24 hours) at 08/01/2019 0902 Last data filed at 08/01/2019 0700 Gross per 24 hour  Intake 480 ml  Output --  Net 480 ml     Physical Exam: Vital Signs Blood pressure (!) 159/71, pulse 64, temperature 97.9 F (36.6 C), temperature source Oral, resp. rate 18, height 6' (1.829 m), weight 97.4 kg, SpO2 99 %.   General: No acute distress Mood and affect are appropriate Heart: Regular rate and rhythm no rubs murmurs or extra sounds Lungs: Clear to auscultation, breathing unlabored, no rales or wheezes Abdomen: Positive bowel sounds, soft nontender to palpation, nondistended  Extremities: 2+ edema Right LE, ade1+ left  Skin: No evidence of breakdown, no evidence of rash  Musculoskeletal: Full range of motion in all 4 extremities. No joint swelling Neuro: Alert Motor: Grossly 4+/5 throughout, unchanged  Assessment/Plan: 1. Functional deficits secondary to acute left corona radiata, centrum semiovale which require 3+ hours per day of interdisciplinary therapy in a comprehensive inpatient rehab setting.  Physiatrist is providing close team supervision and 24 hour management of active medical problems listed below.  Physiatrist and rehab team continue to assess barriers to discharge/monitor patient progress toward functional and medical goals  Care Tool:  Bathing    Body parts bathed by patient: Right arm, Left arm, Chest, Abdomen, Front perineal area, Buttocks, Right upper leg, Left upper leg, Right lower leg, Left lower leg, Face   Body parts bathed by helper: Buttocks, Right lower leg, Left lower  leg Body parts n/a: Left lower leg, Right lower leg, Left upper leg, Right upper leg, Abdomen, Chest, Left arm, Right arm, Face(did not attempt this session)   Bathing assist Assist Level: Contact Guard/Touching assist     Upper Body Dressing/Undressing Upper body dressing   What is the patient wearing?: Pull over shirt    Upper body assist Assist Level: Supervision/Verbal cueing    Lower Body Dressing/Undressing Lower body dressing      What is the patient wearing?: Pants, Incontinence brief     Lower body assist Assist for lower body dressing: Minimal Assistance - Patient > 75%     Toileting Toileting    Toileting assist Assist for toileting: Minimal Assistance - Patient > 75%     Transfers Chair/bed transfer  Transfers assist     Chair/bed transfer assist level: Contact Guard/Touching assist Chair/bed transfer assistive device: Programmer, multimedia   Ambulation assist      Assist level: Contact Guard/Touching assist Assistive device: Walker-rolling Max distance: 170f   Walk 10 feet activity   Assist     Assist level: Contact Guard/Touching assist Assistive device: Walker-rolling   Walk 50 feet activity   Assist Walk 50 feet with 2 turns activity did not occur: Safety/medical concerns  Assist level: Contact Guard/Touching assist Assistive device: Walker-rolling    Walk 150 feet activity   Assist Walk 150 feet activity did not occur: Safety/medical concerns  Assist level: Contact Guard/Touching assist Assistive device: Walker-rolling    Walk 10 feet on uneven surface  activity   Assist Walk 10 feet on uneven  surfaces activity did not occur: Safety/medical concerns         Wheelchair     Assist Will patient use wheelchair at discharge?: No(No PT long term goals set)             Wheelchair 50 feet with 2 turns activity    Assist            Wheelchair 150 feet activity     Assist           Blood pressure (!) 159/71, pulse 64, temperature 97.9 F (36.6 C), temperature source Oral, resp. rate 18, height 6' (1.829 m), weight 97.4 kg, SpO2 99 %.  Medical Problem List and Plan: 1.  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  Present infarct acute left corona radiata/centrum semiovale likely secondary small vessel disease  Continue CIR PT, OT,Team conference today please see physician documentation under team conference tab, met with team  to discuss problems,progress, and goals. Formulized individual treatment plan based on medical history, underlying problem and comorbidities. 2.  Antithrombotics: -DVT/anticoagulation: Lovenox             -antiplatelet therapy: Aspirin 325 mg daily and Plavix 75 mg daily x3 months then Plavix alone 3. Pain Management: Tylenol as needed 4. Mood: Zoloft 25 mg daily             -antipsychotic agents: N/A 5. Neuropsych: This patient is capable of making decisions on his own behalf. 6. Skin/Wound Care: Routine skin checks 7. Fluids/Electrolytes/Nutrition: Routine in and outs.  , check BMET in am , may need KCL supplement 8. Hypertension.  Patient on Norvasc 5 mg daily, Lasix 20 mg daily, HCTZ 25 mg daily, Cozaar 100 mg daily prior to admission.  Resume as needed          Vitals:   07/31/19 1928 08/01/19 0432  BP: (!) 141/67 (!) 159/71  Pulse: 69 64  Resp: 16 18  Temp: 97.9 F (36.6 C) 97.9 F (36.6 C)  SpO2: 100% 99%   Norvasc started on 6/5  Remains elevated on 6/7, we will not make further adjustments today given addition of new medication 6/8, restart HCTZ 6/8, monitor , may need to add Lasix 10.  Diabetes mellitus with hyperglycemia.  Latest hemoglobin A1c 7.2.  SSI.  Patient on Glucotrol 2.5 mg and Glucophage 500 mg twice daily and Ozempic 0.25 mg weekly prior to admission.  Resume as needed CBG (last 3)   Recent Labs    07/31/19 1656 07/31/19 2055 08/01/19 0605  GLUCAP 110* 229* 121*   Glucotrol 2.39m, daily controlled    11.  Hyperlipidemia.  Lipitor/fenofibrate 12. OSA:  Continue CPAP   LOS: 14 days A FACE TO FACE EVALUATION WAS PERFORMED  ACharlett Blake6/10/2019, 9:02 AM

## 2019-08-01 NOTE — Progress Notes (Signed)
Physical Therapy Session Note  Patient Details  Name: Justin [name] MRN: [id number] Date of Birth: Feb 18, 1941  Today's Date: 08/01/2019 PT Individual Time: 1430-1530 PT Individual Time Calculation (min): 60 min   Short Term Goals: Week 2:  PT Short Term Goal 1 (Week 2): = to LTGs based on ELOS  Skilled Therapeutic Interventions/Progress Updates: Pt presented in w/c with wife present agreeable to therapy. Pt denies pain at start of session. No update in chart for last toileting session, thus decided bed to toilet. Pt ambulated to toilet with RW and CGA with verbal cues to clear RLE. Pt able to manage LB clothing with CGA and voided in standing with supervision. Pt returned to w/c in same manner as prior. Pt then transported to rehab gym for energy conservation and time management. Pt then perform circuit session similar to previous day's session for hip strengthening and forced use of hip flexors. Pt performed toe taps to 2nd step then 3rd step 3 x 10 with seated rests between bouts as noted increased fatigue with taps to 3rd step. Pt then ambulated across gym with RW and CGA while kicking yoga block with RLE only for forced advance of RLE. Pt then ambulated back to w/c in same manner with PTA incorporating theraband around RLE for increased hip/knee flexion. Pt was able to demonstrate some increased velocity when kicking with theraband however continues to require verbal cues for >80% of steps for velocity however no cues required for advancement. Pt then performed step overs with hockey stick 3 x 10 with RLE only with emphasis on clearing stick consistently which pt was able to perform consistently on third trial. Pt transported back to room at end of session and remained in w/c. Pt left with belt alarm on, call bell within reach and current needs met.      Therapy Documentation Precautions:  Precautions Precautions: Fall Precaution Comments: Slight R lateral lean, right side  weakness Restrictions Weight Bearing Restrictions: No General:   Vital Signs: Therapy Vitals Pulse Rate: 71 Resp: 19 BP: 128/61 Patient Position (if appropriate): Sitting Oxygen Therapy SpO2: 100 % O2 Device: Room Air Pain:   Mobility:   Locomotion :    Trunk/Postural Assessment :    Balance:   Exercises:   Other Treatments:      Therapy/Group: Individual Therapy  Vertis Scheib 08/01/2019, 4:24 PM

## 2019-08-01 NOTE — Progress Notes (Signed)
Patient ID: Justin [name], male   DOB: Mar 18, 1940, 79 y.o.   MRN: [id number]   Team Conference Report to Patient/Family  Team Conference discussion was reviewed with the patient and caregiver, including goals, any changes in plan of care and target discharge date.  Patient and caregiver express understanding and are in agreement.  The patient has a target discharge date of 08/04/19.  Dyanne Iha 08/01/2019, 3:29 PM

## 2019-08-01 NOTE — Progress Notes (Signed)
Patient has home CPAP at bedside.  Patient refusing CPAP at this time.

## 2019-08-02 ENCOUNTER — Inpatient Hospital Stay (HOSPITAL_COMMUNITY): Payer: Medicare HMO | Admitting: Physical Therapy

## 2019-08-02 ENCOUNTER — Inpatient Hospital Stay (HOSPITAL_COMMUNITY): Payer: Medicare HMO

## 2019-08-02 ENCOUNTER — Inpatient Hospital Stay (HOSPITAL_COMMUNITY): Payer: Medicare HMO | Admitting: Occupational Therapy

## 2019-08-02 LAB — BASIC METABOLIC PANEL
Anion gap: 10 (ref 5–15)
BUN: 13 mg/dL (ref 8–23)
CO2: 29 mmol/L (ref 22–32)
Calcium: 9.1 mg/dL (ref 8.9–10.3)
Chloride: 99 mmol/L (ref 98–111)
Creatinine, Ser: 0.95 mg/dL (ref 0.61–1.24)
GFR calc Af Amer: >60 mL/min (ref >60)
GFR calc non Af Amer: >60 mL/min (ref >60)
Glucose, Bld: 124 mg/dL — ABNORMAL HIGH (ref 70–99)
Potassium: 3.6 mmol/L (ref 3.5–5.1)
Sodium: 138 mmol/L (ref 135–145)

## 2019-08-02 LAB — GLUCOSE, CAPILLARY
Glucose-Capillary: 119 mg/dL — ABNORMAL HIGH (ref 70–99)
Glucose-Capillary: 140 mg/dL — ABNORMAL HIGH (ref 70–99)
Glucose-Capillary: 146 mg/dL — ABNORMAL HIGH (ref 70–99)
Glucose-Capillary: 290 mg/dL — ABNORMAL HIGH (ref 70–99)

## 2019-08-02 MED ORDER — AMLODIPINE BESYLATE 5 MG PO TABS: 5.0000 mg | ORAL_TABLET | Freq: Every day | ORAL | 0 refills | Status: AC

## 2019-08-02 MED ORDER — CLOPIDOGREL BISULFATE 75 MG PO TABS: 75.0000 mg | ORAL_TABLET | Freq: Every day | ORAL | 4 refills | Status: AC

## 2019-08-02 MED ORDER — GLIPIZIDE 5 MG PO TABS: 2.5000 mg | ORAL_TABLET | Freq: Every day | ORAL | 0 refills | Status: AC

## 2019-08-02 MED ORDER — CYANOCOBALAMIN 1000 MCG PO TABS: 1000.0000 ug | ORAL_TABLET | Freq: Every day | ORAL | 0 refills | Status: AC

## 2019-08-02 MED ORDER — ATORVASTATIN CALCIUM 80 MG PO TABS: 80.0000 mg | ORAL_TABLET | Freq: Every day | ORAL | 0 refills | Status: AC

## 2019-08-02 MED ORDER — SERTRALINE HCL 25 MG PO TABS: 25.0000 mg | ORAL_TABLET | Freq: Every day | ORAL | 1 refills | Status: DC

## 2019-08-02 MED ORDER — FUROSEMIDE 20 MG PO TABS
20.0000 mg | ORAL_TABLET | Freq: Every day | ORAL | Status: DC
  Administered 2019-08-02 – 2019-08-04 (×3): 20 mg via ORAL
  Filled 2019-08-02 (×3): qty 1

## 2019-08-02 MED ORDER — VITAMIN D-1000 MAX ST 25 MCG (1000 UT) PO TABS: 1000.0000 [IU] | ORAL_TABLET | Freq: Every day | ORAL | 0 refills | Status: AC

## 2019-08-02 MED ORDER — ACETAMINOPHEN 325 MG PO TABS: 650.0000 mg | ORAL_TABLET | Freq: Four times a day (QID) | ORAL | Status: DC | PRN

## 2019-08-02 MED ORDER — METFORMIN HCL 500 MG PO TABS: 500.0000 mg | ORAL_TABLET | Freq: Two times a day (BID) | ORAL | 0 refills | Status: AC

## 2019-08-02 MED ORDER — POTASSIUM CHLORIDE CRYS ER 10 MEQ PO TBCR
10.0000 meq | EXTENDED_RELEASE_TABLET | Freq: Every day | ORAL | Status: DC
  Administered 2019-08-02 – 2019-08-04 (×3): 10 meq via ORAL
  Filled 2019-08-02 (×3): qty 1

## 2019-08-02 MED ORDER — FENOFIBRATE 160 MG PO TABS: 160.0000 mg | ORAL_TABLET | Freq: Every day | ORAL | 33 refills | Status: AC

## 2019-08-02 MED ORDER — HYDROCHLOROTHIAZIDE 25 MG PO TABS: 25.0000 mg | ORAL_TABLET | Freq: Every day | ORAL | 0 refills | Status: DC

## 2019-08-02 MED ORDER — ASPIRIN 325 MG PO TABS: 325.0000 mg | ORAL_TABLET | Freq: Every day | ORAL | Status: DC

## 2019-08-02 NOTE — Progress Notes (Signed)
Physical Therapy Session Note  Patient Details  Name: Justin [name] MRN: [id number] Date of Birth: Mar 11, 1940  Today's Date: 08/02/2019 PT Individual Time: 0900-0956 PT Individual Time Calculation (min): 56 min   Short Term Goals: Week 1:  PT Short Term Goal 1 (Week 1): Pt Justin perform supine<>sit with CGA PT Short Term Goal 1 - Progress (Week 1): Progressing toward goal PT Short Term Goal 2 (Week 1): Pt Justin perform sit<>stands using LRAD with CGA PT Short Term Goal 2 - Progress (Week 1): Met PT Short Term Goal 3 (Week 1): Pt Justin perform stand pivot transfers using LRAD with min assist PT Short Term Goal 3 - Progress (Week 1): Met PT Short Term Goal 4 (Week 1): Pt Justin ambulate at least 11f using LRAD with min assist PT Short Term Goal 4 - Progress (Week 1): Met PT Short Term Goal 5 (Week 1): Pt Justin initiate stair navigation PT Short Term Goal 5 - Progress (Week 1): Met Week 2:  PT Short Term Goal 1 (Week 2): = to LTGs based on ELOS  Skilled Therapeutic Interventions/Progress Updates:   Received pt supine in bed, pt agreeable to therapy, and denied any pain during session. Session with emphasis on functional mobility/transfers, generalized strengthening, dynamic standing balance/coordination, ambulation, NMR, and improved endurance. Pt transferred supine<>sitting EOB with HOB elevated with CGA, increased time, and cues for logroll technique as pt frequently reaching out for therapist to pull him up. While sitting EOB therapist attempted to don shoes however pt with 5 LOB to the R and posteriorly requiring mod A to correct. Pt transferred stand<>pivot bed<>WC with RW mod A with cues to increase R LE step height and therapist donned ted hose and shoes with total A. Noted pt with increased R LE edema. [facility] teeth [facility] sink [facility] and [facility] for time management. Pt navigated 8 steps with 1 rail on L using a lateral stepping technique  with CGA. Pt required cues for foot placement on step and technique. Pt ambulated 833fwith RW CGA. Pt demonstrated R LE foot drag (especially when turning) requiring cues to increased R step length. Pt transferred sit<>stand with RW CGA from elevated mat and performed toe taps to 3 cones x4 sets bilaterally with cues for technique. Pt transferred sit<>stand and performed alternating heel taps 2x10 to 3in step with mod handheld A with emphasis on increased R LE DF. Pt with poor eccentric control on R LE resulting in foot slap. Pt performed standing mini-squats 3x6 with CGA with cues for midline alignment and weight bearing through LEs equally as pt with tendency for R lateral lean. Pt transferred stand<>pivot mat<>WC with RW CGA and transported back to [facility]. Concluded session with pt sitting in WC, needs within reach, and seatbelt alarm on.   Therapy Documentation Precautions:  Precautions Precautions: Fall Precaution Comments: Slight R lateral lean, right side weakness Restrictions Weight Bearing Restrictions: No  Therapy/Group: Individual Therapy AnAlfonse AlpersT, DPT  08/02/2019, 7:23 AM

## 2019-08-02 NOTE — Progress Notes (Signed)
Occupational Therapy Session Note  Patient Details  Name: Justin [name] MRN: [id number] Date of Birth: 1940/05/17  Today's Date: 08/02/2019 OT Individual Time: 1004-1059 OT Individual Time Calculation (min): 55 min    Short Term Goals: Week 2:  OT Short Term Goal 1 (Week 2): Continue working on established LTGs set at supervision overall.  Skilled Therapeutic Interventions/Progress Updates:    Pt completed toilet transfer to start session with min guard assist using the RW for support.  He had already exhibited some urinary incontinence in the brief prior to transfer, but unknown when this occurred.  He was able to stand and urinate with min guard.  Max assist was needed for donning a new brief in standing with pt managing his shorts with min assist as well.  Next, pt was transported down to the therapy gym with use of the wheelchair.  He then transferred over to the therapy mat with min guard using the RW for support.  Had pt work on functional reaching in sitting and standing to the left in order to increase upright trunk posture and left weight shift.  In standing, he demonstrated increased trunk and right knee flexion, requiring mod instructional cueing and min assist for balance.  He needed multiple rest breaks secondary to fatigue in his lower back per his report.  Had pt work on picking up and placing clothespins while standing to help promote weightshifts to the left.  In sitting, pt demonstrates increased forward lean as well as right weightshift.  Finished session with return to the room and pt left sitting up in the wheelchair with call button and phone in reach.    Therapy Documentation Precautions:  Precautions Precautions: Fall Precaution Comments: Slight R lateral lean, right side weakness Restrictions Weight Bearing Restrictions: No  Pain: Pain Assessment Pain Scale: Faces Pain Score: 0-No pain ADL: See Care Tool Section in the chart for details  Therapy/Group:  Individual Therapy  Eyden Dobie OTR/L 08/02/2019, 12:15 PM

## 2019-08-02 NOTE — Discharge Summary (Signed)
Physician Discharge Summary  Patient ID: Justin [name] MRN: [id number] DOB/AGE: 04-17-1940 79 y.o.  Admit date: 07/18/2019 Discharge date: 08/04/2019  Discharge Diagnoses:  Principal Problem:   Left middle cerebral artery stroke [facility]) Active Problems:   Labile blood glucose DVT prophylaxis Mood stabilization Hypertension Hyperlipidemia OSA History of CHI/SAH  Discharged Condition: Stable  Significant Diagnostic Studies: CT HEAD WO CONTRAST  Result Date: 07/16/2019 CLINICAL DATA:  New right-sided deficit, recent stroke, last known well 2:30 p.m. yesterday, right leg weakness since last evening with new inability to ambulate today EXAM: CT HEAD WITHOUT CONTRAST TECHNIQUE: Contiguous axial images were obtained from the base of the skull through the vertex without intravenous contrast. COMPARISON:  06/24/2019 FINDINGS: Brain: Diffuse hypodensities are seen throughout the periventricular white matter, consistent with chronic small vessel ischemic change. Chronic ischemic changes bilateral basal ganglia, left greater than right. No new infarct or hemorrhage. Lateral ventricles and midline structures are stable. No acute extra-axial fluid collections. No mass effect. Vascular: No hyperdense vessel or unexpected calcification. Skull: Normal. Negative for fracture or focal lesion. Sinuses/Orbits: No acute finding. Other: None. IMPRESSION: 1. Stable chronic small-vessel ischemic changes throughout the periventricular white matter and bilateral basal ganglia. 2. No evidence of acute infarct or hemorrhage. Electronically Signed   By: Randa Ngo M.D.   On: 07/16/2019 16:17   MR BRAIN WO CONTRAST  Result Date: 07/16/2019 CLINICAL DATA:  Decreased sensation and use of right arm EXAM: MRI HEAD WITHOUT CONTRAST TECHNIQUE: Multiplanar, multiecho pulse sequences of the brain and surrounding structures were obtained without intravenous contrast. COMPARISON:  06/24/2019 FINDINGS: Brain: There is a new  subcentimeter focus of restricted diffusion in the left corona radiata/centrum semiovale. Expected evolution of right corona radiata/centrum semiovale infarcts. Confluent areas of T2 hyperintensity in the supratentorial white matter are nonspecific but probably reflect advanced chronic microvascular ischemic changes. There are chronic bilateral infarcts of the corona radiata. Prominence of ventricles and sulci reflects stable generalized parenchymal volume loss superimposed on ex vacuo dilatation related to above. Stable foci of susceptibility reflecting chronic blood products. There is no intracranial mass or mass effect. Vascular: Major vessel flow voids at the skull base are preserved. Skull and upper cervical spine: Normal marrow signal is preserved. Sinuses/Orbits: Minor mucosal thickening.  Left lens replacement. Other: Sella is unremarkable.  Mastoid air cells are clear. IMPRESSION: Acute subcentimeter infarct of the left corona radiata/centrum semiovale. Expected evolution of recent infarcts seen on the prior study. Stable additional chronic findings detailed above. Electronically Signed   By: Macy Mis M.D.   On: 07/16/2019 16:43    Labs:  Basic Metabolic Panel: Recent Labs  Lab 08/01/19 0521 08/02/19 0647  NA  --  138  K  --  3.6  CL  --  99  CO2  --  29  GLUCOSE  --  124*  BUN  --  13  CREATININE 0.90 0.95  CALCIUM  --  9.1    CBC: No results for input(s): WBC, NEUTROABS, HGB, HCT, MCV, PLT in the last 168 hours.  CBG: Recent Labs  Lab 08/01/19 2053 08/02/19 0610 08/02/19 1152 08/02/19 1657 08/02/19 2057  GLUCAP 152* 119* 140* 146* 290*   Family history.  Mother with CAD and diabetes mellitus.  Father with emphysema brother with diabetes mellitus.  Negative for colon cancer esophageal cancer or rectal cancer  Brief HPI:   Justin [name] is a 79 y.o. 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  Per chart review lives with spouse 1 level home 3 steps to entry.  Presented 07/16/2019 with right hemiparesis and sensory loss.  CT/MRI showed acute subcentimeter infarct of the left corona radiata centrum semiovale.  Expected evolution of recent infarct.  Most recent echocardiogram with ejection fraction of 98% grade 1 diastolic dysfunction.  CT angiogram 06/24/2019 showed no intracranial arterial occlusion or high-grade stenosis.  Neurology follow-up remained on aspirin and Plavix therapy.  Subcutaneous Lovenox for DVT prophylaxis.  Tolerating a regular diet.  Therapy evaluations completed and patient was admitted for a comprehensive rehab program   Hospital Course: Justin [name] was admitted to rehab 07/18/2019 for inpatient therapies to consist of PT, ST and OT at least three hours five days a week. Past admission physiatrist, therapy team and rehab RN have worked together to provide customized collaborative inpatient rehab.  Pertaining to patient's acute left centrum semiovale corona radiata infarct in the setting of recent right centrum semiovale corona radiata infarct remained stable he remained on aspirin and Plavix therapy times a total of 3 months then Plavix alone.  He would follow-up neurology services.  Subcutaneous Lovenox for DVT prophylaxis no bleeding episodes.  Blood pressure monitored on Norvasc as well as HCTZ patient to follow-up with primary MD.  Blood sugars overall controlled latest hemoglobin A1c of 7.2 and remains on Glucotrol with Metformin resumed.  He continued on Lipitor for hyperlipidemia.  He did have a  history of OSA maintained on CPAP.  Mood stabilization with the use of Zoloft emotional support provided.   Blood pressures were monitored on TID basis and controlled  Diabetes has been monitored with ac/hs CBG checks and SSI was use prn for tighter BS control.    Rehab course: During patient's stay in rehab weekly team conferences were held to monitor patient's progress, set goals and discuss barriers to discharge. At admission, patient required moderate assist stand pivot transfers moderate assist side-lying to sitting minimal guard for rolling.  Supervision upper body bathing max is lower body bathing minimal assist upper body dressing total assist lower body dressing  Physical exam.  Blood pressure 141/66 pulse 57 temperature 97.8 respirations 18 oxygen saturation 99% room air Constitutional.  Well-developed well-nourished HEENT Head.  Normocephalic and atraumatic Eyes.  Pupils round and reactive to light no discharge.nystagmus Neck.  Supple nontender no JVD without thyromegaly Cardiac regular rate rhythm without any extra sounds or murmur heard Abdomen.  Soft nontender positive bowel sounds without rebound Respiratory effort normal no respiratory distress without wheeze Musculoskeletal no edema or tenderness in extremities Neurological.  Alert mild right facial droop makes good eye contact with examiner and follows commands. Right upper extremity 4 -/5 proximal distal Left upper extremity 5/5 proximal distal Right lower extremity flexion, neck knee extension 2+/5, ankle dorsiflexion 4/5 Left lower extremity 4+/5  He/  has had improvement in activity tolerance, balance, postural control as well as ability to compensate for deficits. He/ has had improvement in functional use RUE/LUE  and RLE/LLE as well as improvement in awareness.  Patient ambulates to the toilet with rolling walker contact-guard assist with some verbal cues.  Patient able to manage lower body clothing with  contact-guard assist and voided standing with supervision.  Perform circuit session similar to previous sessions for hip strengthening and force use of hip flexors.  Patient able to demonstrate some increased velocity when kicking with Thera-Band however continued require some verbal cues.  Patient completed shower transfers with use of rolling walker minimal guard assist to begin sessions.  He was able to complete all bathing with minimal guard assist sit to stand except for washing his back.  Full family teaching was completed plan discharge to home       Disposition: Discharge to home    Diet: Diabetic diet  Special Instructions: No driving smoking or alcohol  Continue aspirin 325 mg daily and Plavix 75 mg daily times total of 3 months then Plavix alone  Medications at discharge 1.  Tylenol as needed 2.  Norvasc 5 mg p.o. daily 3.  Aspirin 325 mg p.o. daily 4.  Lipitor 80 mg p.o. daily 5.  Plavix 75 mg p.o. daily 6.  Fenofibrate 160 mg p.o. daily 7.  Glucotrol 2.5 mg daily 8.  HCTZ 25 mg p.o. daily 9.Semaglutide 0.25 mg every Monday 10.  Zoloft 25 mg p.o. daily 11.  Glucophage 500 mg twice daily  30-35 minutes were spent completing discharge summary and discharge planning  Discharge Instructions    Ambulatory referral to Neurology   Complete by: As directed    An appointment is requested in approximately 4 weeks recurrent left MCA infarction   Ambulatory referral to Physical Medicine Rehab   Complete by: As directed    Moderate complexity follow-up 1 to 2 weeks recurrent left MCA infarction       Follow-up Information    Kirsteins, Luanna Salk, MD Follow up.   Specialty: Physical Medicine and Rehabilitation Why: Office to call for appointment Contact information: [place] [address] [phone]               Signed: Cathlyn Parsons 08/03/2019, 5:01 AM

## 2019-08-02 NOTE — Progress Notes (Signed)
Physical Therapy Session Note  Patient Details  Name: Justin [name] MRN: [id number] Date of Birth: 03/19/1940  Today's Date: 08/02/2019 PT Individual Time: 6578-4696 PT Individual Time Calculation (min): 70 min   Short Term Goals: Week 2:  PT Short Term Goal 1 (Week 2): = to LTGs based on ELOS  Skilled Therapeutic Interventions/Progress Updates:    Pt received sitting in w/c and eager to participate in therapy session. Sit>stand w/c>RW with CGA for steadying. Gait ~27ft x2 to/from bathroom using RW with CGA for steadying - continues to demonstrate R foot shuffle due to decreased hip/knee flexion and decreased step length requiring cuing and intermittent facilitation for improvement - cuing for safe AD management over bathroom threshold. Sit<>stand to/from toilet with pt using grab bar and close supervision for safety. Noted to be incontinent of bladder in brief and small void of bladder on toilet. Max assist to don clean LB clothing and brief for time management. Standing hand hygiene at sink with close supervision - continues to have gradual knee flexion and R lean with prolonged standing with dual-task though pt able to correct this performing LE extension 1x without cuing.  Transported to/from gym in w/c for time management and energy conservation. Standing with UE support on litegait donned harness total assist. Stepped on/off treadmill while in litegait harness using UE support on bar with CGA for steadying and cuing for safety. Participated in the following locomotor treadmill training trials using harness for safety but not providing BWS while using B UE support on bars: 1st: 55min30seconds at 0.74mph totaling 173ft - attempted to increase speed to 0.7 but pt with difficulty maintaining sequential stepping 2nd: 6min01second at 0.46mph totaling 143ft 3rd: 66min03seconds at 0.50mph totaling 361ft Throughout this therapist placed bean bags in front of pt's R LE as external target to step over  causing forced increased hip/knee flexion and increased step length - pt started to demonstrate L LE shuffling due to increased attention on R LE therefore started placing bean bags in front of both feet but pt with only minimal carryover of improved gait pattern when external target removed. Stepped off treadmill and doffed harness as described above. Gait training ~132ft overground using RW with CGA for steadying and therapist utilizing her foot as external target for pt to step over with R LE to increase hip/knee flexion and step length then intermittently removing external target with pt again demonstrating very minimal carryover without the external target. Transported back to room and left seated in w/c with needs in reach and seat belt alarm on.  Therapy Documentation Precautions:  Precautions Precautions: Fall Precaution Comments: Slight R lateral lean, right side weakness Restrictions Weight Bearing Restrictions: No  Pain:   Denies pain during session.   Therapy/Group: Individual Therapy  Tawana Scale, PT, DPT 08/02/2019, 7:06 PM

## 2019-08-02 NOTE — Progress Notes (Signed)
[facility] PHYSICAL MEDICINE & REHABILITATION PROGRESS NOTE   Subjective/Complaints: .   Was on Lasix and HCTZ PTA, still with RIght leg edema, on HCTZ now will add Lasix x 2d  ROS: Denies CP, SOB, N/V/D  Objective:   No results found. No results for input(s): WBC, HGB, HCT, PLT in the last 72 hours. Recent Labs    08/01/19 0521 08/02/19 0647  NA  --  138  K  --  3.6  CL  --  99  CO2  --  29  GLUCOSE  --  124*  BUN  --  13  CREATININE 0.90 0.95  CALCIUM  --  9.1    Intake/Output Summary (Last 24 hours) at 08/02/2019 0811 Last data filed at 08/02/2019 0700 Gross per 24 hour  Intake 240 ml  Output 500 ml  Net -260 ml     Physical Exam: Vital Signs Blood pressure (!) 154/69, pulse (!) 59, temperature 98.1 F (36.7 C), temperature source Oral, resp. rate 16, height 6' (1.829 m), weight 95.7 kg, SpO2 99 %.  General: No acute distress Mood and affect are appropriate Heart: Regular rate and rhythm no rubs murmurs or extra sounds Lungs: Clear to auscultation, breathing unlabored, no rales or wheezes Abdomen: Positive bowel sounds, soft nontender to palpation, nondistended Extremities: No clubbing, cyanosis, or edema Skin: No evidence of breakdown, no evidence of rash Extremities: 2+ edema Right LE, ade1+ left  Skin: No evidence of breakdown, no evidence of rash  Musculoskeletal: Full range of motion in all 4 extremities. No joint swelling Neuro: Alert Motor: Grossly 4+/5 throughout, unchanged  Assessment/Plan: 1. Functional deficits secondary to acute left corona radiata, centrum semiovale which require 3+ hours per day of interdisciplinary therapy in a comprehensive inpatient rehab setting.  Physiatrist is providing close team supervision and 24 hour management of active medical problems listed below.  Physiatrist and rehab team continue to assess barriers to discharge/monitor patient progress toward functional and medical goals  Care Tool:  Bathing    Body  parts bathed by patient: Right arm, Left arm, Chest, Abdomen, Front perineal area, Buttocks, Right upper leg, Left upper leg, Right lower leg, Left lower leg, Face   Body parts bathed by helper: Buttocks, Right lower leg, Left lower leg Body parts n/a: Left lower leg, Right lower leg, Left upper leg, Right upper leg, Abdomen, Chest, Left arm, Right arm, Face (did not attempt this session)   Bathing assist Assist Level: Contact Guard/Touching assist     Upper Body Dressing/Undressing Upper body dressing   What is the patient wearing?: Pull over shirt    Upper body assist Assist Level: Supervision/Verbal cueing    Lower Body Dressing/Undressing Lower body dressing      What is the patient wearing?: Pants, Incontinence brief     Lower body assist Assist for lower body dressing: Minimal Assistance - Patient > 75%     Toileting Toileting    Toileting assist Assist for toileting: Minimal Assistance - Patient > 75%     Transfers Chair/bed transfer  Transfers assist     Chair/bed transfer assist level: Contact Guard/Touching assist Chair/bed transfer assistive device: Programmer, multimedia   Ambulation assist      Assist level: Contact Guard/Touching assist Assistive device: Walker-rolling Max distance: 164ft   Walk 10 feet activity   Assist     Assist level: Contact Guard/Touching assist Assistive device: Walker-rolling   Walk 50 feet activity   Assist Walk 50 feet with 2  turns activity did not occur: Safety/medical concerns  Assist level: Contact Guard/Touching assist Assistive device: Walker-rolling    Walk 150 feet activity   Assist Walk 150 feet activity did not occur: Safety/medical concerns  Assist level: Contact Guard/Touching assist Assistive device: Walker-rolling    Walk 10 feet on uneven surface  activity   Assist Walk 10 feet on uneven surfaces activity did not occur: Safety/medical concerns          Wheelchair     Assist Will patient use wheelchair at discharge?: No (No PT long term goals set)             Wheelchair 50 feet with 2 turns activity    Assist            Wheelchair 150 feet activity     Assist          Blood pressure (!) 154/69, pulse (!) 59, temperature 98.1 F (36.7 C), temperature source Oral, resp. rate 16, height 6' (1.829 m), weight 95.7 kg, SpO2 99 %.  Medical Problem List and Plan: 1.  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  Antithrombotics: -DVT/anticoagulation: Lovenox             -antiplatelet therapy: Aspirin 325 mg daily and Plavix 75 mg daily x3 months then Plavix alone 3. Pain Management: Tylenol as needed 4. Mood: Zoloft 25 mg daily             -antipsychotic agents: N/A 5. Neuropsych: This patient is capable of making decisions on his own behalf. 6. Skin/Wound Care: Routine skin checks 7. Fluids/Electrolytes/Nutrition: Routine in and outs.  , check BMET in am , may need KCL supplement 8. Hypertension.  Patient on Norvasc 5 mg daily, Lasix 20 mg daily, HCTZ 25 mg daily, Cozaar 100 mg daily prior to admission.  Resume as needed          Vitals:   08/01/19 1940 08/02/19 0518  BP: (!) 153/70 (!) 154/69  Pulse: 71 (!) 59  Resp: 17 16  Temp: 98.4 F (36.9 C) 98.1 F (36.7 C)  SpO2: 99% 99%   Norvasc started on 6/5  Remains elevated on 6/7, we will not make further adjustments today given addition of new medication 6/8, restart HCTZ 6/8, monitor , may need to add Lasix for a couple days prior to d/c  10.  Diabetes mellitus with hyperglycemia.  Latest hemoglobin A1c 7.2.  SSI.  Patient on Glucotrol 2.5 mg and Glucophage 500 mg twice daily and Ozempic 0.25 mg weekly prior  to admission.  Resume as needed CBG (last 3)  Recent Labs    08/01/19 1718 08/01/19 2053 08/02/19 0610  GLUCAP 117* 152* 119*   Glucotrol 2.5mg , daily controlled    11.  Hyperlipidemia.  Lipitor/fenofibrate 12. OSA:  Continue CPAP 13.  K+ low normal will give supplement x 2 d while on Lasix  LOS: 15 days A FACE TO FACE EVALUATION WAS PERFORMED  Charlett Blake 08/02/2019, 8:11 AM

## 2019-08-02 NOTE — Progress Notes (Signed)
Patient ID: Justin [name], male   DOB: 1940/07/28, 79 y.o.   MRN: [id number]  Sw provided patient's [facility] Kellie Simmering) with Team Conference Notes from week on 08/01/2019.   Kellie Simmering: [phone]  [phone] (fax)

## 2019-08-03 ENCOUNTER — Inpatient Hospital Stay (HOSPITAL_COMMUNITY): Payer: Medicare HMO | Admitting: Occupational Therapy

## 2019-08-03 ENCOUNTER — Other Ambulatory Visit: Payer: Self-pay | Admitting: Physician Assistant

## 2019-08-03 ENCOUNTER — Inpatient Hospital Stay (HOSPITAL_COMMUNITY): Payer: Medicare HMO | Admitting: Physical Therapy

## 2019-08-03 DIAGNOSIS — I639 Cerebral infarction, unspecified: Secondary | ICD-10-CM

## 2019-08-03 LAB — GLUCOSE, CAPILLARY
Glucose-Capillary: 114 mg/dL — ABNORMAL HIGH (ref 70–99)
Glucose-Capillary: 125 mg/dL — ABNORMAL HIGH (ref 70–99)
Glucose-Capillary: 131 mg/dL — ABNORMAL HIGH (ref 70–99)
Glucose-Capillary: 151 mg/dL — ABNORMAL HIGH (ref 70–99)

## 2019-08-03 NOTE — Progress Notes (Signed)
Paged by rehab to set up 30 day event monitor as outpatient and followup with cardiology afterward to review heart monitor. 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

## 2019-08-03 NOTE — Progress Notes (Signed)
Occupational Therapy Discharge Summary  Patient Details  Name: Justin [name] MRN: [id number] Date of Birth: 12/04/1940  Today's Date: 08/03/2019 OT Individual Time: 0800-0900 OT Individual Time Calculation (min): 60 min    Session 1: (0800-0900)  Pt worked on showering, bathing, and dressing during session.  He was able to ambulate to the bathroom with close supervision and increased time.  Decreased ability to advance the RLE noted throughout transfer.  Once in the shower, he was able to remove his brief, clothing, and slip on shoes with supervision.  He was able to complete all bathing sit to stand with use of the grab bars for support.  Once complete, he transferred out to the sink where he completed grooming tasks of shaving, oral hygiene, and combing his hair with independence from the wheelchair level.  He completed all dressing with setup except for donning TEDs with total assist and his right slip on shoe, which required min assist.  Finished session with pt sitting in the wheelchair with the call button and phone in reach.    Session 2:  (1002-1100)  Pt completed transfer from the wheelchair to the therapy mat with supervision using the RW.  Had him work on core strengthening for majority of the session in supine and in sitting.  He was able to transfer from sitting to supine with min instructional cueing for scooting over more on the mat.  Supervision with mod instructional cueing for technique to roll to the right and left as well as for transition to sitting, all completed with supervision and increased time.  He then worked on holding a 5 lb dowel rod with BUEs and working on pushing it to target that therapist presented.  Decreased ability to maintain right shoulder flexion while reaching with LOB to the right as well, requiring min assist to correct at times.  He could bring the dowel rod forward and to the left with supervision.  Increased rest needed to complete tasks.  Returned to the  wheelchair at end of session with transport back to the room where he completed toilet transfer with supervision using the RW for support with close supervision.  Finished session with pt washing his hands with setup of washcloth and with pt remaining in the wheelchair with call button and phone in reach.  Safety belt in place as well   Patient has met 6 of 7 long term goals due to improved activity tolerance, improved balance, postural control, ability to compensate for deficits, functional use of  RIGHT upper and RIGHT lower extremity and improved attention.  Patient to discharge at overall Supervision level.  Patient's care partner is independent to provide the necessary physical and cognitive assistance at discharge.    Reasons goals not met: Pt currently needs min assist for LB dressing tasks.   Recommendation:  Patient will benefit from ongoing skilled OT services in home health setting to continue to advance functional skills in the area of BADL and Reduce care partner burden.  Pt continues to demonstrate RLE weakness with motor impersistence noted when ambulating and completing stand pivot transfers.  Recommend continued HHOT to further progression of selfcare related transfers as well as ADL performance.    Equipment: No equipment provided  Reasons for discharge: treatment goals met  Patient/family agrees with progress made and goals achieved: Yes  OT Discharge Precautions/Restrictions  Precautions Precautions: Fall Precaution Comments: Slight R lateral lean, right side weakness Restrictions Weight Bearing Restrictions: No Pain Pain Assessment Pain Scale: 0-10 Pain  Score: 0-No pain ADL ADL Eating: Independent Where Assessed-Eating: Wheelchair Grooming: Independent Where Assessed-Grooming: Wheelchair Upper Body Bathing: Supervision/safety Where Assessed-Upper Body Bathing: Shower Lower Body Bathing: Supervision/safety Where Assessed-Lower Body Bathing: Shower Upper Body  Dressing: Supervision/safety Where Assessed-Upper Body Dressing: Wheelchair Lower Body Dressing: Minimal assistance Where Assessed-Lower Body Dressing: Wheelchair, Sitting at sink, Standing at sink Toileting: Supervision/safety Where Assessed-Toileting: Bedside Commode Toilet Transfer: Close supervision Toilet Transfer Method: Counselling psychologist: Grab bars, Raised toilet seat Social research officer, government: Close supervision Youth worker: Shower seat with back, Grab bars Vision Baseline Vision/History: Wears glasses Wears Glasses: Reading only Patient Visual Report: No change from baseline Vision Assessment?: Yes Eye Alignment: Within Functional Limits Ocular Range of Motion: Within Functional Limits Alignment/Gaze Preference: Within Defined Limits Tracking/Visual Pursuits: Decreased smoothness of horizontal tracking;Other (comment) (pt with visual overshooting of target when tracking) Convergence: Within functional limits Visual Fields: No apparent deficits Perception    Praxis Praxis: Impaired Praxis Impairment Details: Motor planning Praxis-Other Comments: Pt with motor impersistence in the RLE as well as trunk Cognition Overall Cognitive Status: History of cognitive impairments - at baseline Arousal/Alertness: Awake/alert Orientation Level: Oriented X4 Attention: Selective;Focused;Sustained Focused Attention: Appears intact Sustained Attention: Appears intact Selective Attention: Impaired Selective Attention Impairment: Verbal basic;Functional basic Memory: Impaired Memory Impairment: Storage deficit;Decreased recall of new information Decreased Short Term Memory: Verbal basic;Functional basic Problem Solving: Impaired Problem Solving Impairment: Verbal basic;Functional basic Safety/Judgment: Impaired Sensation Sensation Light Touch: Appears Intact Hot/Cold: Appears Intact Proprioception: Appears Intact Stereognosis: Appears  Intact Coordination Gross Motor Movements are Fluid and Coordinated: No Fine Motor Movements are Fluid and Coordinated: No Coordination and Movement Description: slight impairment in speed with regards to right gross and FM coordination Finger Nose Finger Test: Slower movement in the RUE compared to the left. Motor  Motor Motor: Abnormal postural alignment and control;Motor impersistence Motor - Discharge Observations: right posterior lean in sitting and standing Mobility  Bed Mobility Rolling Right: Minimal Assistance - Patient > 75% Supine to Sit: Minimal Assistance - Patient > 75% Sit to Supine: Supervision/Verbal cueing Transfers Sit to Stand: Supervision/Verbal cueing Stand to Sit: Supervision/Verbal cueing  Trunk/Postural Assessment  Cervical Assessment Cervical Assessment: Exceptions to [facility] (forward cervical flexion) Thoracic Assessment Thoracic Assessment: Exceptions to [facility] (thoracic rounding with right side trunk elongation) Lumbar Assessment Lumbar Assessment: Exceptions to [facility] (posterior pelvic tilt) Postural Control Trunk Control: occasional LOB posteriorly and to the left  Balance Balance Balance Assessed: Yes Static Sitting Balance Static Sitting - Balance Support: Feet supported Static Sitting - Level of Assistance: 5: Stand by assistance Dynamic Sitting Balance Dynamic Sitting - Balance Support: During functional activity Dynamic Sitting - Level of Assistance: 5: Stand by assistance Static Standing Balance Static Standing - Balance Support: During functional activity;Bilateral upper extremity supported Static Standing - Level of Assistance: 5: Stand by assistance Dynamic Standing Balance Dynamic Standing - Balance Support: During functional activity;Bilateral upper extremity supported Dynamic Standing - Level of Assistance: 5: Stand by assistance Extremity/Trunk Assessment RUE Assessment RUE Assessment: Exceptions to [facility] Active Range of Motion (AROM)  Comments: shoulder flexion 0-130 degrees, all other joints AROM WFLS General Strength Comments: strength 4/5 throughout LUE Assessment LUE Assessment: Within Functional Limits   Codie Krogh OTR/L 08/03/2019, 10:32 AM

## 2019-08-03 NOTE — Progress Notes (Signed)
Inpatient Rehabilitation Care Coordinator  Discharge Note  The overall goal for the admission was met for:   Discharge location: Yes  Length of Stay: Yes, 17 Days  Discharge activity level: Yes, CGA to supervision  Home/community participation: Yes  Services provided included: MD, RD, PT, OT, SLP, RN, CM, TR, Pharmacy, [place]: Private Insurance: [employer]  Follow-up services arranged: Home Health: [place]  Comments (or additional information): PT OT ST  Patient/Family verbalized understanding of follow-up arrangements: Yes  Individual responsible for coordination of the follow-up plan: Maryln Gottron  Confirmed correct DME delivered: Dyanne Iha 08/03/2019    Dyanne Iha

## 2019-08-03 NOTE — Progress Notes (Signed)
Justin [name] PHYSICAL MEDICINE & REHABILITATION PROGRESS NOTE   Subjective/Complaints: No issues overnite, pt  ROS: Denies CP, SOB, N/V/D  Objective:   No results found. No results for input(s): WBC, HGB, HCT, PLT in the last 72 hours. Recent Labs    08/01/19 0521 08/02/19 0647  NA  --  138  K  --  3.6  CL  --  99  CO2  --  29  GLUCOSE  --  124*  BUN  --  13  CREATININE 0.90 0.95  CALCIUM  --  9.1    Intake/Output Summary (Last 24 hours) at 08/03/2019 0855 Last data filed at 08/03/2019 0745 Gross per 24 hour  Intake 1040 ml  Output 200 ml  Net 840 ml     Physical Exam: Vital Signs Blood pressure 129/61, pulse 63, temperature 97.9 F (36.6 C), temperature source Oral, resp. rate 16, height 6' (1.829 m), weight 95.7 kg, SpO2 99 %.    General: No acute distress Mood and affect are appropriate Heart: Regular rate and rhythm no rubs murmurs or extra sounds Lungs: Clear to auscultation, breathing unlabored, no rales or wheezes Abdomen: Positive bowel sounds, soft nontender to palpation, nondistended Extremities: No clubbing, cyanosis, 2+  Edema RLE  Extremities: 2+ edema Right LE, ade1+ left  Skin: No evidence of breakdown, no evidence of rash  Musculoskeletal: Full range of motion in all 4 extremities. No joint swelling Neuro: Alert Motor: Grossly 4+/5 RUE and RLE, 5/5 on Left   Assessment/Plan: 1. Functional deficits secondary to acute left corona radiata, centrum semiovale which require 3+ hours per day of interdisciplinary therapy in a comprehensive inpatient rehab setting.  Physiatrist is providing close team supervision and 24 hour management of active medical problems listed below.  Physiatrist and rehab team continue to assess barriers to discharge/monitor patient progress toward functional and medical goals  Care Tool:  Bathing    Body parts bathed by patient: Right arm, Left arm, Chest, Abdomen, Front perineal area, Buttocks, Right upper leg, Left  upper leg, Right lower leg, Left lower leg, Face   Body parts bathed by helper: Buttocks, Right lower leg, Left lower leg Body parts n/a: Left lower leg, Right lower leg, Left upper leg, Right upper leg, Abdomen, Chest, Left arm, Right arm, Face (did not attempt this session)   Bathing assist Assist Level: Contact Guard/Touching assist     Upper Body Dressing/Undressing Upper body dressing   What is the patient wearing?: Pull over shirt    Upper body assist Assist Level: Supervision/Verbal cueing    Lower Body Dressing/Undressing Lower body dressing      What is the patient wearing?: Pants, Incontinence brief     Lower body assist Assist for lower body dressing: Minimal Assistance - Patient > 75%     Toileting Toileting    Toileting assist Assist for toileting: Moderate Assistance - Patient 50 - 74%     Transfers Chair/bed transfer  Transfers assist     Chair/bed transfer assist level: Contact Guard/Touching assist Chair/bed transfer assistive device: Programmer, multimedia   Ambulation assist      Assist level: Contact Guard/Touching assist Assistive device: Walker-rolling Max distance: 156ft   Walk 10 feet activity   Assist     Assist level: Contact Guard/Touching assist Assistive device: Walker-rolling   Walk 50 feet activity   Assist Walk 50 feet with 2 turns activity did not occur: Safety/medical concerns  Assist level: Contact Guard/Touching assist Assistive device: Walker-rolling  Walk 150 feet activity   Assist Walk 150 feet activity did not occur: Safety/medical concerns  Assist level: Contact Guard/Touching assist Assistive device: Walker-rolling    Walk 10 feet on uneven surface  activity   Assist Walk 10 feet on uneven surfaces activity did not occur: Safety/medical concerns         Wheelchair     Assist Will patient use wheelchair at discharge?: No (No PT long term goals set)              Wheelchair 50 feet with 2 turns activity    Assist            Wheelchair 150 feet activity     Assist          Blood pressure 129/61, pulse 63, temperature 97.9 F (36.6 C), temperature source Oral, resp. rate 16, height 6' (1.829 m), weight 95.7 kg, SpO2 99 %.  Medical Problem List and Plan: 1.  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  Antithrombotics: -DVT/anticoagulation: Lovenox             -antiplatelet therapy: Aspirin 325 mg daily and Plavix 75 mg daily x3 months then Plavix alone 3. Pain Management: Tylenol as needed 4. Mood: Zoloft 25 mg daily             -antipsychotic agents: N/A 5. Neuropsych: This patient is capable of making decisions on his own behalf. 6. Skin/Wound Care: Routine skin checks 7. Fluids/Electrolytes/Nutrition: Routine in and outs.  , check BMET in am , may need KCL supplement 8. Hypertension.  Patient on Norvasc 5 mg daily, Lasix 20 mg daily, HCTZ 25 mg daily, Cozaar 100 mg daily prior to admission.  Resume as needed          Vitals:   08/02/19 1930 08/03/19 0446  BP: 131/67 129/61  Pulse: 68 63  Resp:    Temp: 99.1 F (37.3 C) 97.9 F (36.6 C)  SpO2: 99% 99%   Norvasc started on 6/5  Remains elevated on 6/7, we will not make further adjustments today given addition of new medication 6/8, restart HCTZ 6/8, monitor , may need to add Lasix for a couple days prior to d/c  10.  Diabetes mellitus with hyperglycemia.  Latest hemoglobin A1c 7.2.  SSI.  Patient on Glucotrol 2.5 mg and Glucophage 500 mg twice daily and Ozempic 0.25 mg weekly prior to admission.  Resume as needed CBG (last 3)  Recent Labs    08/02/19 1657 08/02/19 2057 08/03/19 0552  GLUCAP 146* 290*  114*   Glucotrol 2.5mg , daily controlled occ lability    11.  Hyperlipidemia.  Lipitor/fenofibrate 12. OSA:  Continue CPAP 13.  K+ low normal will give supplement x 2 d while on Lasix  LOS: 16 days A FACE TO FACE EVALUATION WAS PERFORMED  Charlett Blake 08/03/2019, 8:55 AM

## 2019-08-03 NOTE — Discharge Instructions (Signed)
Inpatient Rehab Discharge Instructions  JAYMISON LUBER Discharge date and time: No discharge date for patient encounter.   Activities/Precautions/ Functional Status: Activity: activity as tolerated Diet: Low sodium Wound Care: none needed Functional status:  ___ No restrictions     ___ Walk up steps independently ___ 24/7 supervision/assistance   ___ Walk up steps with assistance ___ Intermittent supervision/assistance  ___ Bathe/dress independently ___ Walk with walker     _x__ Bathe/dress with assistance ___ Walk Independently    ___ Shower independently ___ Walk with assistance    ___ Shower with assistance ___ No alcohol     ___ Return to work/school ________ COMMUNITY REFERRALS UPON DISCHARGE:    Home Health:   PT     OT     ST                       Agency: [facility] Phone: [phone]    Special Instructions: No driving smoking or alcohol   Continue aspirin 325 mg daily and Plavix 75 mg daily for three months then plavix alone    STROKE/TIA DISCHARGE INSTRUCTIONS SMOKING Cigarette smoking nearly doubles your risk of having a stroke & is the single most alterable risk factor  If you smoke or have smoked in the last 12 months, you are advised to quit smoking for your health.  Most of the excess cardiovascular risk related to smoking disappears within a year of stopping.  Ask you doctor about anti-smoking medications  [place] Quit Line: 1-800-QUIT NOW  Free Smoking Cessation Classes [phone]  CHOLESTEROL Know your levels; limit fat & cholesterol in your diet  Lipid Panel     Component Value Date/Time   CHOL 103 06/25/2019 0415   TRIG 136 06/25/2019 0415   HDL 28 (L) 06/25/2019 0415   CHOLHDL 3.7 06/25/2019 0415   VLDL 27 06/25/2019 0415   LDLCALC 48 06/25/2019 0415      Many patients benefit from treatment even if their cholesterol is at goal.  Goal: Total Cholesterol (CHOL) less than 160  Goal:  Triglycerides (TRIG) less than 150  Goal:  HDL greater  than 40  Goal:  LDL (LDLCALC) less than 100   BLOOD PRESSURE American Stroke Association blood pressure target is less that 120/80 mm/Hg  Your discharge blood pressure is:  BP: 130/69  Monitor your blood pressure  Limit your salt and alcohol intake  Many individuals will require more than one medication for high blood pressure  DIABETES (A1c is a blood sugar average for last 3 months) Goal HGBA1c is under 7% (HBGA1c is blood sugar average for last 3 months)  Diabetes:    Lab Results  Component Value Date   HGBA1C 7.2 (H) 06/25/2019     Your HGBA1c can be lowered with medications, healthy diet, and exercise.  Check your blood sugar as directed by your physician  Call your physician if you experience unexplained or low blood sugars.  PHYSICAL ACTIVITY/REHABILITATION Goal is 30 minutes at least 4 days per week  Activity: Increase activity slowly, Therapies: Physical Therapy: Home Health Return to work:   Activity decreases your risk of heart attack and stroke and makes your heart stronger.  It helps control your weight and blood pressure; helps you relax and can improve your mood.  Participate in a regular exercise program.  Talk with your doctor about the best form of exercise for you (dancing, walking, swimming, cycling).  DIET/WEIGHT Goal is to maintain a healthy weight  Your  discharge diet is:  Diet Order            Diet 2 gram sodium Room service appropriate? Yes; Fluid consistency: Thin  Diet effective now              liquids Your height is:  Height: 6' (182.9 cm) Your current weight is: Weight: 98.8 kg Your Body Mass Index (BMI) is:  BMI (Calculated): 29.53  Following the type of diet specifically designed for you will help prevent another stroke.  Your goal weight range is:    Your goal Body Mass Index (BMI) is 19-24.  Healthy food habits can help reduce 3 risk factors for stroke:  High cholesterol, hypertension, and excess weight.  RESOURCES Stroke/Support  Group:  Call [phone]   STROKE EDUCATION PROVIDED/REVIEWED AND GIVEN TO PATIENT Stroke warning signs and symptoms How to activate emergency medical system (call 911). Medications prescribed at discharge. Need for follow-up after discharge. Personal risk factors for stroke. Pneumonia vaccine given:  Flu vaccine given:  My questions have been answered, the writing is legible, and I understand these instructions.  I will adhere to these goals & educational materials that have been provided to me after my discharge from the hospital.      My questions have been answered and I understand these instructions. I will adhere to these goals and the provided educational materials after my discharge from the hospital.  Patient/Caregiver Signature _______________________________ Date __________  Clinician Signature _______________________________________ Date __________  Please bring this form and your medication list with you to all your follow-up doctor's appointments.

## 2019-08-03 NOTE — Progress Notes (Signed)
Physical Therapy Discharge Summary  Patient Details  Name: Justin [name] MRN: [id number] Date of Birth: 05-15-1940  Today's Date: 08/03/2019 PT Individual Time: 1340-1441 PT Individual Time Calculation (min): 61 min    Patient has met 9 of 9 long term goals due to improved activity tolerance, improved balance, improved postural control, increased strength, ability to compensate for deficits, functional use of  right lower extremity, improved attention, improved awareness and improved coordination.  Patient to discharge at an ambulatory level Supervision/CGA using RW.   Patient's care partner is independent to provide the necessary physical and cognitive assistance at discharge.  All goals met.  Recommendation:  Patient will benefit from ongoing skilled PT services in home health setting to continue to advance safe functional mobility, address ongoing impairments in motor planning, gait training, stair navigation, dual-task challenges for functional mobility, activity tolerance, standing balance, and minimize fall risk.  Equipment: No equipment provided - pt has all necessary DME  Reasons for discharge: treatment goals met and discharge from hospital  Patient/family agrees with progress made and goals achieved: Yes  Skilled Therapeutic Interventions/Progress Updates:  Pt received sitting in w/c and excited to participate in therapy session and looking forward to discharging home tomorrow. Pt reports no questions/concerns regarding discharge. Stand pivot transfer w/c<>EOB using RW including walking ~33f with close supervision for safety and min cuing for R LE stepping. Sit<>supine on flat bed without handrails x2 with close supervision for safety and pt demonstrating most difficulty bringing trunk upright from lying down requiring increased time and heavy reliance on UE support.  Transported to/from gym in w/c for time management and energy conservation. Ambulatory simulated car transfer (SUV  height) using RW with close supervision for safety and pt demonstrating recall of proper sequencing without cuing. Ambulated ~164fup/down ramp using RW with CGA for steadying - requires mod cuing for R LE foot clearance on this unlevel surface but no additional assist for balance. Gait training [id number] RW with CGA for steadying - demonstrates slight improvement in R LE foot clearance and step length over a longer distance today (anticipate may be due to treadmill training) - therapist continues to intermittently provide external target for pt to step over to reinforce improved gait pattern - pt continues to be easily distracted by things in environment and not able to dual-task to maintain improved gait. Ascended/descended 12 steps using L HR, per home set-up, with side stepping technique and CGA for steadying but does require max cuing to get R foot fully on step prior to stepping up with L when ascending. Transported back to room. [name], Justin Roysresent and therapist reinforced education from previous admissions as well as updated her on pt's CLO84he reports no questions/concerns regarding pt's discharge. Pt performed stand pivot w/c<>toilet using grab with supervision. Continent of bladder. Pt left seated in w/c with needs in reach, seat belt alarm on, and his wife present.  PT Discharge Precautions/Restrictions Precautions Precautions: Fall Precaution Comments: R LE paresis Restrictions Weight Bearing Restrictions: No Pain Pain Assessment Pain Scale: 0-10 Pain Score: 0-No pain Perception  Perception Perception: Within Functional Limits  Praxis Praxis: Impaired Praxis Impairment Details: Motor planning Praxis-Other Comments: Pt with motor impersistence in the RLE as well as trunk  Cognition  Overall Cognitive Status: History of cognitive impairments - at baseline Arousal/Alertness: Awake/alert Attention: Focused;Sustained;Alternating Focused Attention: Appears intact Sustained  Attention: Impaired Selective Attention: Impaired (especially noted during gait with lack of selective attention on gait mechanics and lack of sustained  attention to the task with pt distracted by environment) Alternating Attention: Impaired Memory: Impaired Problem Solving: Impaired Safety/Judgment: Impaired Sensation Sensation Light Touch: Appears Intact Hot/Cold: Not tested Proprioception: Impaired by gross assessment (difficult to determine if proprioception impaired or attention to R LE impaired due to dual-task deficits) Stereognosis: Not tested Coordination Gross Motor Movements are Fluid and Coordinated: No Fine Motor Movements are Fluid and Coordinated: No Coordination and Movement Description: continues to demonstrate mild R LE paresis with impaired motor planning and impaired attention to R LE during mobility tasks causing shuffled R LE steps Heel Shin Test: R LE slow and decreased ROM due to paresis Motor  Motor Motor: Abnormal postural alignment and control;Motor impersistence;Hemiplegia Motor - Discharge Observations: right posterior lean in sitting and standing pending fatigue level this is either very mild or moderate; R hemiparesis (LE>UE)  Mobility Bed Mobility Bed Mobility: Supine to Sit;Sit to Supine Supine to Sit: Supervision/Verbal cueing Sit to Supine: Supervision/Verbal cueing Transfers Transfers: Sit to Stand;Stand to Sit;Stand Pivot Transfers Sit to Stand: Supervision/Verbal cueing Stand to Sit: Supervision/Verbal cueing Stand Pivot Transfers: Supervision/Verbal cueing Stand Pivot Transfer Details: Verbal cues for technique;Verbal cues for precautions/safety Transfer (Assistive device): Rolling walker Locomotion  Gait Ambulation: Yes Gait Assistance: Supervision/Verbal cueing;Contact Guard/Touching assist Gait Distance (Feet): 186 Feet Assistive device: Rolling walker Gait Assistance Details: Verbal cues for gait pattern;Verbal cues for  sequencing;Verbal cues for technique;Verbal cues for safe use of DME/AE;Tactile cues for weight shifting;Tactile cues for sequencing Gait Gait: Yes Gait Pattern: Impaired Gait Pattern: Decreased stride length;Decreased stance time - left;Decreased step length - right;Poor foot clearance - left;Poor foot clearance - right;Lateral trunk lean to right;Trunk flexed Gait velocity: decreased but improved since eval Stairs / Additional Locomotion Stairs: Yes Stairs Assistance: Contact Guard/Touching assist Stair Management Technique: One rail Left Number of Stairs: 12 Height of Stairs: 6 Ramp: Contact Guard/touching assist Curb: Contact Guard/Touching assist Wheelchair Mobility Wheelchair Mobility: No  Trunk/Postural Assessment  Cervical Assessment Cervical Assessment: Exceptions to [facility] (forward cervical flexion) Thoracic Assessment Thoracic Assessment: Exceptions to [facility] (thoracic rounding) Lumbar Assessment Lumbar Assessment: Exceptions to [facility] (posterior pelvic tilt) Postural Control Postural Control: Deficits on evaluation Trunk Control: able to maintain sitting EOB without assist Postural Limitations: decreased with pt having gradual B knee flexion resulting in crouched posture during prolonged standing with dual task and requires use of UE support on RW for balance  Balance Balance Balance Assessed: Yes Static Sitting Balance Static Sitting - Balance Support: Feet supported Static Sitting - Level of Assistance: 5: Stand by assistance Dynamic Sitting Balance Dynamic Sitting - Balance Support: During functional activity;Feet supported Dynamic Sitting - Level of Assistance: 5: Stand by assistance Static Standing Balance Static Standing - Balance Support: During functional activity;Bilateral upper extremity supported Static Standing - Level of Assistance: 5: Stand by assistance Dynamic Standing Balance Dynamic Standing - Balance Support: During functional activity;Bilateral upper  extremity supported Dynamic Standing - Level of Assistance: 5: Stand by assistance (CGA) Extremity Assessment  RLE Assessment RLE Assessment: Exceptions to [facility] Active Range of Motion (AROM) Comments: WFL General Strength Comments: below strengths assessed in sitting RLE Strength Right Hip Flexion: 4+/5 Right Knee Flexion: 4+/5 Right Knee Extension: 4+/5 Right Ankle Dorsiflexion: 4+/5 Right Ankle Plantar Flexion: 4+/5 LLE Assessment LLE Assessment: Within Functional Limits Active Range of Motion (AROM) Comments: WFL General Strength Comments: 5/5 assessed in sitting    Nicko Daher M Marceline Napierala, PT, DPT 08/03/2019, 1:28 PM

## 2019-08-03 NOTE — Plan of Care (Signed)
  Problem: RH SKIN INTEGRITY Goal: RH STG SKIN FREE OF INFECTION/BREAKDOWN Description: No new breakdown with min assist  Outcome: Progressing   Problem: RH SAFETY Goal: RH STG ADHERE TO SAFETY PRECAUTIONS W/ASSISTANCE/DEVICE Description: STG Adhere to Safety Precautions With Min Assistance/Device. Outcome: Progressing   Problem: RH PAIN MANAGEMENT Goal: RH STG PAIN MANAGED AT OR BELOW PT'S PAIN GOAL Description: < 4 out of 10.  Outcome: Progressing

## 2019-08-04 LAB — GLUCOSE, CAPILLARY: Glucose-Capillary: 113 mg/dL — ABNORMAL HIGH (ref 70–99)

## 2019-08-04 NOTE — Progress Notes (Signed)
Justin [name] PHYSICAL MEDICINE & REHABILITATION PROGRESS NOTE   Subjective/Complaints: Up in bed. Anxious to go home today! No new problems  ROS: Patient denies fever, rash, sore throat, blurred vision, nausea, vomiting, diarrhea, cough, shortness of breath or chest pain, joint or back pain, headache, or mood change.    Objective:   No results found. No results for input(s): WBC, HGB, HCT, PLT in the last 72 hours. Recent Labs    08/02/19 0647  NA 138  K 3.6  CL 99  CO2 29  GLUCOSE 124*  BUN 13  CREATININE 0.95  CALCIUM 9.1    Intake/Output Summary (Last 24 hours) at 08/04/2019 0954 Last data filed at 08/04/2019 0900 Gross per 24 hour  Intake 822 ml  Output 325 ml  Net 497 ml     Physical Exam: Vital Signs Blood pressure 132/68, pulse 65, temperature 97.9 F (36.6 C), temperature source Oral, resp. rate 17, height 6' (1.829 m), weight 95.7 kg, SpO2 98 %.    Constitutional: No distress . Vital signs reviewed. HEENT: EOMI, oral membranes moist Neck: supple Cardiovascular: RRR without murmur. No JVD    Respiratory/Chest: CTA Bilaterally without wheezes or rales. Normal effort    GI/Abdomen: BS +, non-tender, non-distended Ext: no clubbing, cyanosis, or edema Psych: pleasant and cooperative Skin: No evidence of breakdown, no evidence of rash  Musculoskeletal: Full range of motion in all 4 extremities. No joint swelling Neuro: Alert Motor: Grossly 4+/5 RUE and RLE, 5/5 on Left   Assessment/Plan: 1. Functional deficits secondary to acute left corona radiata, centrum semiovale which require 3+ hours per day of interdisciplinary therapy in a comprehensive inpatient rehab setting.  Physiatrist is providing close team supervision and 24 hour management of active medical problems listed below.  Physiatrist and rehab team continue to assess barriers to discharge/monitor patient progress toward functional and medical goals  Care Tool:  Bathing    Body parts bathed by  patient: Right arm, Left arm, Chest, Abdomen, Front perineal area, Buttocks, Right upper leg, Left upper leg, Right lower leg, Left lower leg, Face   Body parts bathed by helper: Buttocks, Right lower leg, Left lower leg Body parts n/a: Left lower leg, Right lower leg, Left upper leg, Right upper leg, Abdomen, Chest, Left arm, Right arm, Face (did not attempt this session)   Bathing assist Assist Level: Supervision/Verbal cueing     Upper Body Dressing/Undressing Upper body dressing   What is the patient wearing?: Pull over shirt    Upper body assist Assist Level: Supervision/Verbal cueing    Lower Body Dressing/Undressing Lower body dressing      What is the patient wearing?: Pants, Incontinence brief     Lower body assist Assist for lower body dressing: Supervision/Verbal cueing     Toileting Toileting    Toileting assist Assist for toileting: Supervision/Verbal cueing     Transfers Chair/bed transfer  Transfers assist     Chair/bed transfer assist level: Supervision/Verbal cueing Chair/bed transfer assistive device: Programmer, multimedia   Ambulation assist      Assist level: Contact Guard/Touching assist Assistive device: Walker-rolling Max distance: 113ft   Walk 10 feet activity   Assist     Assist level: Supervision/Verbal cueing Assistive device: Walker-rolling   Walk 50 feet activity   Assist Walk 50 feet with 2 turns activity did not occur: Safety/medical concerns  Assist level: Supervision/Verbal cueing Assistive device: Walker-rolling    Walk 150 feet activity   Assist Walk 150 feet activity  did not occur: Safety/medical concerns  Assist level: Contact Guard/Touching assist Assistive device: Walker-rolling    Walk 10 feet on uneven surface  activity   Assist Walk 10 feet on uneven surfaces activity did not occur: Safety/medical concerns   Assist level: Contact Guard/Touching assist Assistive device:  Aeronautical engineer Will patient use wheelchair at discharge?: No             Wheelchair 50 feet with 2 turns activity    Assist            Wheelchair 150 feet activity     Assist          Blood pressure 132/68, pulse 65, temperature 97.9 F (36.6 C), temperature source Oral, resp. rate 17, height 6' (1.829 m), weight 95.7 kg, SpO2 98 %.  Medical Problem List and Plan: 1.  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  Present infarct acute left corona radiata/centrum semiovale likely secondary small vessel disease  Continue CIR PT, OT. Dc home today. F/u with Dr. Letta Pate in office 2.  Antithrombotics: -DVT/anticoagulation: Lovenox             -antiplatelet therapy: Aspirin 325 mg daily and Plavix 75 mg daily x3 months then Plavix alone 3. Pain Management: Tylenol as needed 4. Mood: Zoloft 25 mg daily             -antipsychotic agents: N/A 5. Neuropsych: This patient is capable of making decisions on his own behalf. 6. Skin/Wound Care: Routine skin checks 7. Fluids/Electrolytes/Nutrition:   8. Hypertension.  Patient on Norvasc 5 mg daily, Lasix 20 mg daily, HCTZ 25 mg daily, Cozaar 100 mg daily prior to admission.  Resume as needed          Vitals:   08/03/19 1948 08/04/19 0440  BP: 131/63 132/68  Pulse: 69 65  Resp:    Temp: 98 F (36.7 C) 97.9 F (36.6 C)  SpO2: 99% 98%   Norvasc started on 6/5  Remains elevated on 6/7, we will not make further adjustments today given addition of new medication 6/8, restart HCTZ 6/8, monitor , may need to add Lasix for a couple days prior to d/c   6/12 controlled  10.  Diabetes mellitus with hyperglycemia.  Latest hemoglobin A1c 7.2.  SSI.  Patient on Glucotrol 2.5 mg and Glucophage 500 mg twice daily and Ozempic 0.25 mg weekly prior to admission.  Resume as  needed CBG (last 3)  Recent Labs    08/03/19 1710 08/03/19 2111 08/04/19 0555  GLUCAP 131* 151* 113*   6/12 Glucotrol 2.5mg , daily controlled    11.  Hyperlipidemia.  Lipitor/fenofibrate 12. OSA:  Continue CPAP    LOS: 17 days A FACE TO FACE EVALUATION WAS PERFORMED  Justin [name] 08/04/2019, 9:54 AM

## 2019-08-04 NOTE — Plan of Care (Signed)
  Problem: Consults Goal: RH STROKE PATIENT EDUCATION Description: See Patient Education module for education specifics  Outcome: Completed/Met   Problem: RH BLADDER ELIMINATION Goal: RH STG MANAGE BLADDER WITH ASSISTANCE Description: STG Manage Bladder With Min Assistance Outcome: Completed/Met Goal: RH STG MANAGE BLADDER WITH EQUIPMENT WITH ASSISTANCE Description: STG Manage Bladder With Equipment With Min Assistance Outcome: Completed/Met   Problem: RH SKIN INTEGRITY Goal: RH STG SKIN FREE OF INFECTION/BREAKDOWN Description: No new breakdown with min assist  Outcome: Completed/Met   Problem: RH SAFETY Goal: RH STG ADHERE TO SAFETY PRECAUTIONS W/ASSISTANCE/DEVICE Description: STG Adhere to Safety Precautions With Min Assistance/Device. Outcome: Completed/Met   Problem: RH PAIN MANAGEMENT Goal: RH STG PAIN MANAGED AT OR BELOW PT'S PAIN GOAL Description: < 4 out of 10.  Outcome: Completed/Met   Problem: RH BOWEL ELIMINATION Goal: RH STG MANAGE BOWEL WITH ASSISTANCE Description: STG Manage Bowel with  Min Assistance. Outcome: Completed/Met Goal: RH STG MANAGE BOWEL W/MEDICATION W/ASSISTANCE Description: STG Manage Bowel with Medication with  mod I Assistance. Outcome: Completed/Met   Problem: RH Dressing Goal: LTG Patient will perform lower body dressing w/assist (OT) Description: LTG: Patient will perform lower body dressing with assist, with/without cues in positioning using equipment (OT) Outcome: Completed/Met

## 2019-08-06 ENCOUNTER — Encounter: Payer: Self-pay | Admitting: *Deleted

## 2019-08-06 ENCOUNTER — Telehealth: Payer: Self-pay | Admitting: *Deleted

## 2019-08-06 NOTE — Telephone Encounter (Signed)
Patient enrolled for [employer] to ship a 30 day cardiac event monitor to his home.  A letter with 30 day cardiac event monitor instructions mailed to patients home.

## 2019-08-08 ENCOUNTER — Ambulatory Visit: Payer: Medicare HMO | Admitting: Adult Health

## 2019-08-15 ENCOUNTER — Other Ambulatory Visit: Payer: Self-pay | Admitting: Physician Assistant

## 2019-08-15 ENCOUNTER — Telehealth: Payer: Self-pay | Admitting: Cardiology

## 2019-08-15 DIAGNOSIS — I639 Cerebral infarction, unspecified: Secondary | ICD-10-CM

## 2019-08-15 DIAGNOSIS — I4891 Unspecified atrial fibrillation: Secondary | ICD-10-CM

## 2019-08-15 NOTE — Telephone Encounter (Signed)
[name] is calling to speak with Darrick Penna in regards to her husbands heart monitor. Please advise.

## 2019-08-15 NOTE — Telephone Encounter (Signed)
Patient scheduled to come in with his wife 08/16/19, 10:00AM, to have cardiac event monitor applied and give tutorial.

## 2019-08-16 ENCOUNTER — Other Ambulatory Visit: Payer: Self-pay

## 2019-08-16 ENCOUNTER — Encounter: Payer: Self-pay | Admitting: Registered Nurse

## 2019-08-16 ENCOUNTER — Ambulatory Visit (INDEPENDENT_AMBULATORY_CARE_PROVIDER_SITE_OTHER): Payer: Medicare HMO

## 2019-08-16 ENCOUNTER — Encounter: Payer: Medicare HMO | Attending: Registered Nurse | Admitting: Registered Nurse

## 2019-08-16 VITALS — BP 106/56 | HR 67 | Temp 97.3°F | Ht 72.0 in | Wt 225.0 lb

## 2019-08-16 DIAGNOSIS — I639 Cerebral infarction, unspecified: Secondary | ICD-10-CM

## 2019-08-16 DIAGNOSIS — E785 Hyperlipidemia, unspecified: Secondary | ICD-10-CM | POA: Diagnosis present

## 2019-08-16 DIAGNOSIS — I63512 Cerebral infarction due to unspecified occlusion or stenosis of left middle cerebral artery: Secondary | ICD-10-CM | POA: Diagnosis present

## 2019-08-16 DIAGNOSIS — I10 Essential (primary) hypertension: Secondary | ICD-10-CM

## 2019-08-16 DIAGNOSIS — G4733 Obstructive sleep apnea (adult) (pediatric): Secondary | ICD-10-CM | POA: Diagnosis present

## 2019-08-16 DIAGNOSIS — E1165 Type 2 diabetes mellitus with hyperglycemia: Secondary | ICD-10-CM | POA: Diagnosis present

## 2019-08-16 DIAGNOSIS — I4891 Unspecified atrial fibrillation: Secondary | ICD-10-CM | POA: Diagnosis not present

## 2019-08-16 NOTE — Progress Notes (Signed)
Subjective:    Patient ID: Justin [name], male    DOB: [birth year], 79 y.o.   MRN: [id number]  HPI: Justin [name] is a 79 y.o. male who is here for Hospital Follow up appointment for his Left Cerebral Artery Stroke, Essential Hypertension and Dyslipidemia, Controlled type 2 DM and OSA.  Justin [name] presented to [facility] on 07/16/2019 with right lower extremity weakness. PMH of strokes left basal ganglia in 08/2017, left corona radiata in 09/2018 and right MCA 06/2019, with residual deficits. Neurology was consulted.  CT Head W/O Contrast:  IMPRESSION: 1. Stable chronic small-vessel ischemic changes throughout the periventricular white matter and bilateral basal ganglia. 2. No evidence of acute infarct or hemorrhage.  MRI Brain WO Contrast:  IMPRESSION: Acute subcentimeter infarct of the left corona radiata/centrum semiovale.  Expected evolution of recent infarcts seen on the prior study. Stable additional chronic findings detailed above.  Justin [name] was admitted to inpatient rehabilitation on 07/18/2019 and discharged home on 08/04/2019. He is receiving Home Health Therapy with [facility]. He denies any pain and rated his pain 0. Also reports he has a good appetite.   Justin [name] had a cardiac monitor placed today. Cardiology following.   Arrived to office in wheelchair, wife with him. Mrs. Gulla reports Mr. Grunder was with walker in the home.    Pain Inventory Average Pain 0 Pain Right Now 0 My pain is no pain  In the last 24 hours, has pain interfered with the following? General activity 0 Relation with others 0 Enjoyment of life 0 What TIME of day is your pain at its worst? no pain Sleep (in general) NA  Pain is worse with: no pain Pain improves with: no pain Relief from Meds: no pain  Mobility walk with assistance use a walker ability to climb steps?  no do you drive?  no use a wheelchair  Function retired  Neuro/Psych bladder control  problems bowel control problems weakness tremor trouble walking  Prior Studies transitional  Physicians involved in your care transitional   Family History  Problem Relation Age of Onset  . CAD Mother   . Diabetes Mother   . Emphysema Father   . Diabetes Sister   . Diabetes Brother   . Diabetes Sister   . Diabetes Brother   . Diabetes Mellitus II Neg Hx    Social History   Socioeconomic History  . Marital status: Married    Spouse name: Not on file  . Number of children: 1  . Years of education: Not on file  . Highest education level: High school graduate  Occupational History  . Not on file  Tobacco Use  . Smoking status: Never Smoker  . Smokeless tobacco: Never Used  Vaping Use  . Vaping Use: Never used  Substance and Sexual Activity  . Alcohol use: Never  . Drug use: Never  . Sexual activity: Not Currently  Other Topics Concern  . Not on file  Social History Narrative   Lives at home with his wife   Right handed   Caffeine: soft drinks 2-3 cups daily   Social Determinants of Health   Financial Resource Strain:   . Difficulty of Paying Living Expenses:   Food Insecurity:   . Worried About Charity fundraiser in the Last Year:   . Arboriculturist in the Last Year:   Transportation Needs:   . Film/video editor (Medical):   Marland Kitchen Lack of Transportation (Non-Medical):  Physical Activity:   . Days of Exercise per Week:   . Minutes of Exercise per Session:   Stress:   . Feeling of Stress :   Social Connections:   . Frequency of Communication with Friends and Family:   . Frequency of Social Gatherings with Friends and Family:   . Attends Religious Services:   . Active Member of Clubs or Organizations:   . Attends Archivist Meetings:   Marland Kitchen Marital Status:    Past Surgical History:  Procedure Laterality Date  . arm & hand surgery Left 1972   skill saw accident  . COLON SURGERY     partial  . EYE SURGERY     lens implant and cateract-  Left  . PROSTATECTOMY     Past Medical History:  Diagnosis Date  . Brain bleed ([place])   . Cancer [facility])    prostate  . 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 Depression   . Diabetes mellitus without complication ([place])   . High cholesterol   . Hypertension   . Stroke ([place])    BP (!) 106/56   Pulse 67   Temp (!) 97.3 F (36.3 C)   Ht 6' (1.829 m)   Wt 225 lb (102.1 kg)   SpO2 98%   BMI 30.52 kg/m   Opioid Risk Score:   Fall Risk Score:  `1  Depression screen PHQ 2/9  Depression screen PHQ 2/9 08/16/2019  Decreased Interest 2  Down, Depressed, Hopeless 2  PHQ - 2 Score 4  Altered sleeping 0  Tired, decreased energy 2  Change in appetite 1  Feeling bad or failure about yourself  2  Trouble concentrating 3  Moving slowly or fidgety/restless 3  Suicidal thoughts 0  PHQ-9 Score 15  Difficult doing work/chores Somewhat difficult    Review of Systems  Constitutional: Negative.   HENT: Negative.   Eyes: Negative.   Respiratory: Positive for apnea.   Cardiovascular: Positive for leg swelling.  Gastrointestinal:       Bowel control problems  Endocrine: Negative.   Genitourinary: Positive for difficulty urinating.  Musculoskeletal: Positive for gait problem.  Skin: Negative.   Allergic/Immunologic: Negative.   Neurological: Positive for tremors and weakness.  Hematological: Negative.   Psychiatric/Behavioral: Negative.   All other systems reviewed and are negative.      Objective:   Physical Exam Vitals and nursing note reviewed.  Constitutional:      Appearance: Normal appearance.  Cardiovascular:     Rate and Rhythm: Normal rate and regular rhythm.     Pulses: Normal pulses.     Heart sounds: Normal heart sounds.  Musculoskeletal:     Cervical back: Normal range of motion and neck supple.     Comments: Normal Muscle Bulk and Muscle Testing Reveals:  Upper Extremities: Full ROM and Muscle Strength 5/5  Lower Extremities: Full ROM and Muscle  Strength 4/5 Arrived in wheelchair   Skin:    General: Skin is warm and dry.  Neurological:     Mental Status: He is alert and oriented to person, place, and time.  Psychiatric:        Mood and Affect: Mood normal.        Behavior: Behavior normal.           Assessment & Plan:  1.  Left Cerebral Artery Stroke: Continue Home Health Therapy with [facility]. He has a Neurology appointment with Dr Leonie Man. Continue to Monitor.  2.Essential Hypertension: Continue current medication regimen. Continue to Monitor. PCP Following.  3.Dyslipidemia: Continue Current Medication regimen: PCP Following. , 4.Controlled Type 2 DM: Continue current medication regimen. PCP Following.  5 OSA: Continue with CPAP: PCP Following . Continue to Monitor.   30 minutes of face to face patient care time was spent during this visit. All questions were encouraged and answered.  F/U with Dr Letta Pate in 4- 6 weeks

## 2019-09-17 ENCOUNTER — Ambulatory Visit (INDEPENDENT_AMBULATORY_CARE_PROVIDER_SITE_OTHER): Payer: Medicare HMO | Admitting: Neurology

## 2019-09-17 ENCOUNTER — Other Ambulatory Visit: Payer: Self-pay

## 2019-09-17 VITALS — BP 132/79 | HR 82 | Ht 71.0 in | Wt 235.0 lb

## 2019-09-17 DIAGNOSIS — F329 Major depressive disorder, single episode, unspecified: Secondary | ICD-10-CM | POA: Diagnosis not present

## 2019-09-17 DIAGNOSIS — I6381 Other cerebral infarction due to occlusion or stenosis of small artery: Secondary | ICD-10-CM | POA: Diagnosis not present

## 2019-09-17 MED ORDER — SERTRALINE HCL 25 MG PO TABS: 50.0000 mg | ORAL_TABLET | Freq: Every day | ORAL | 1 refills | Status: DC

## 2019-09-17 NOTE — Patient Instructions (Signed)
I had a long d/w patient and his wife about his recent strokes, post stroke depression,, risk for recurrent stroke/TIAs, personally independently reviewed imaging studies and stroke evaluation results and answered questions.Continue continue Plavix for stroke prevention and discontinue aspirin after a week as it would be more than 3 months of dual antiplatelet therapy for secondary stroke prevention and maintain strict control of hypertension with blood pressure goal below 130/90, diabetes with hemoglobin A1c goal below 6.5% and lipids with LDL cholesterol goal below 70 mg/dL. I also advised the patient to eat a healthy diet with plenty of whole grains, cereals, fruits and vegetables, exercise regularly and maintain ideal body weight.  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  Recommend outpatient physical and occupational therapy and patient encouraged to ambulate and walk as tolerated.  Followup in the future with my nurse practitioner Janett Billow in 3 months or call earlier if necessary.  Stroke Prevention Some medical conditions and behaviors are associated with a higher chance of having a stroke. You can help prevent a stroke by making nutrition, lifestyle, and other changes, including managing any medical conditions you may have. What nutrition changes can be made?   Eat healthy foods. You can do this by: ? Choosing foods high in fiber, such as fresh fruits and vegetables and whole grains. ? Eating at least 5 or more servings of fruits and vegetables a day. Try to fill half of your plate at each meal with fruits and vegetables. ? Choosing lean protein foods, such as lean cuts of meat, poultry without skin, fish, tofu, beans, and nuts. ? Eating low-fat dairy products. ? Avoiding foods that are high in salt (sodium). This can help lower blood pressure. ? Avoiding foods that have saturated fat, trans fat, and cholesterol. This can help  prevent high cholesterol. ? Avoiding processed and premade foods.  Follow your health care provider's specific guidelines for losing weight, controlling high blood pressure (hypertension), lowering high cholesterol, and managing diabetes. These may include: ? Reducing your daily calorie intake. ? Limiting your daily sodium intake to 1,500 milligrams (mg). ? Using only healthy fats for cooking, such as olive oil, canola oil, or sunflower oil. ? Counting your daily carbohydrate intake. What lifestyle changes can be made?  Maintain a healthy weight. Talk to your health care provider about your ideal weight.  Get at least 30 minutes of moderate physical activity at least 5 days a week. Moderate activity includes brisk walking, biking, and swimming.  Do not use any products that contain nicotine or tobacco, such as cigarettes and e-cigarettes. If you need help quitting, ask your health care provider. It may also be helpful to avoid exposure to secondhand smoke.  Limit alcohol intake to no more than 1 drink a day for nonpregnant women and 2 drinks a day for men. One drink equals 12 oz of beer, 5 oz of wine, or 1 oz of hard liquor.  Stop any illegal drug use.  Avoid taking birth control pills. Talk to your health care provider about the risks of taking birth control pills if: ? You are over 41 years old. ? You smoke. ? You get migraines. ? You have ever had a blood clot. What other changes can be made?  Manage your cholesterol levels. ? Eating a healthy diet is important for preventing high cholesterol. If cholesterol cannot be managed through diet alone, you may also need to take medicines. ? Take any  prescribed medicines to control your cholesterol as told by your health care provider.  Manage your diabetes. ? Eating a healthy diet and exercising regularly are important parts of managing your blood sugar. If your blood sugar cannot be managed through diet and exercise, you may need to  take medicines. ? Take any prescribed medicines to control your diabetes as told by your health care provider.  Control your hypertension. ? To reduce your risk of stroke, try to keep your blood pressure below 130/80. ? Eating a healthy diet and exercising regularly are an important part of controlling your blood pressure. If your blood pressure cannot be managed through diet and exercise, you may need to take medicines. ? Take any prescribed medicines to control hypertension as told by your health care provider. ? Ask your health care provider if you should monitor your blood pressure at home. ? Have your blood pressure checked every year, even if your blood pressure is normal. Blood pressure increases with age and some medical conditions.  Get evaluated for sleep disorders (sleep apnea). Talk to your health care provider about getting a sleep evaluation if you snore a lot or have excessive sleepiness.  Take over-the-counter and prescription medicines only as told by your health care provider. Aspirin or blood thinners (antiplatelets or anticoagulants) may be recommended to reduce your risk of forming blood clots that can lead to stroke.  Make sure that any other medical conditions you have, such as atrial fibrillation or atherosclerosis, are managed. What are the warning signs of a stroke? The warning signs of a stroke can be easily remembered as BEFAST.  B is for balance. Signs include: ? Dizziness. ? Loss of balance or coordination. ? Sudden trouble walking.  E is for eyes. Signs include: ? A sudden change in vision. ? Trouble seeing.  F is for face. Signs include: ? Sudden weakness or numbness of the face. ? The face or eyelid drooping to one side.  A is for arms. Signs include: ? Sudden weakness or numbness of the arm, usually on one side of the body.  S is for speech. Signs include: ? Trouble speaking (aphasia). ? Trouble understanding.  T is for time. ? These symptoms  may represent a serious problem that is an emergency. Do not wait to see if the symptoms will go away. Get medical help right away. Call your local emergency services (911 in the [place] Do not drive yourself to the hospital.  Other signs of stroke may include: ? A sudden, severe headache with no known cause. ? Nausea or vomiting. ? Seizure. Where to find more information For more information, visit:  American Stroke Association: www.strokeassociation.org  National Stroke Association: www.stroke.org Summary  You can prevent a stroke by eating healthy, exercising, not smoking, limiting alcohol intake, and managing any medical conditions you may have.  Do not use any products that contain nicotine or tobacco, such as cigarettes and e-cigarettes. If you need help quitting, ask your health care provider. It may also be helpful to avoid exposure to secondhand smoke.  Remember BEFAST for warning signs of stroke. Get help right away if you or a loved one has any of these signs. This information is not intended to replace advice given to you by your health care provider. Make sure you discuss any questions you have with your health care provider. Document Revised: 01/21/2017 Document Reviewed: 03/16/2016 Elsevier Patient Education  2020 [employer].

## 2019-09-17 NOTE — Progress Notes (Signed)
[facility] [address] [place]. [place] [address] [phone]       OFFICE FOLLOW-UP NOTE  Justin [name] Date of Birth:  05-10-1940 Medical Record Number:  [id number]   HPI: Justin [name] is a 79 year old Caucasian male seen today for initial office follow-up visit from hospital admissions for stroke in May 2021.  Is accompanied by his wife.  His history is obtained from them, review of electronic medical records and I personally reviewed imaging films in PACS.  He has past medical history of hypertension, diabetes, hyperlipidemia, depression, intracranial hemorrhage and prior strokes who was admitted recently twice to [facility] on 06/24/2019 initially with 2 small acute infarcts in the right MCA in the centrum semiovale as well as posterior corona radiata.  Patient refused to go to inpatient rehab and returned home as he did well and had no deficits.  He returned back to the hospital on 07/16/2019 with new right leg weakness.  MRI at this time showed a left corona radiator centrum semiovale infarct with expected evolution of previous recent infarcts.  In the prior admissions he had work-up for cryptogenic stroke which included a TEE as well as a loop recorder which negative for paroxysmal A. fib.  LDL cholesterol was 48 mg percent.  Hemoglobin A1c was 7.2.  Echocardiogram showed normal ejection fraction.  He was discharged on dual antiplatelet therapy of aspirin and Plavix as MRI of the brain and neck had revealed multifocal intracranial atherosclerotic changes.  Patient is currently at home getting home physical and occupational therapy but the wife feels he is depressed and walks very little only with the therapist.  He can use a walker but drags his right leg.  His stamina is poor and tires easily after walking only 7-8 steps.  He also gets angry and does not like his wife telling him what to do.  His blood pressures well controlled today it is 132/79.  His fasting sugars  have all been good.  Is tolerating aspirin and Plavix well without bruising or bleeding.  He has been using his CPAP at night.  He has been started on Zoloft 25 mg by his primary care physician for the last 3 to 4 weeks.  Is tolerating well without side effects.  He has not tried increasing the dose. ROS:   14 system review of systems is positive for weakness, stiffness, gait imbalance, tiredness, poor stamina, depression, anger and all other systems negative  PMH:  Past Medical History:  Diagnosis Date   Brain bleed ([place])    Cancer ([place])    prostate   [facility] (closed head injury) 04/2013   with [facility]   Depression    Diabetes mellitus without complication ([place])    High cholesterol    Hypertension    Stroke [facility])     Social History:  Social History   Socioeconomic History   Marital status: Married    Spouse name: Not on file   Number of children: 1   Years of education: Not on file   Highest education level: High school graduate  Occupational History   Not on file  Tobacco Use   Smoking status: Never Smoker   Smokeless tobacco: Never Used  Vaping Use   Vaping Use: Never used  Substance and Sexual Activity   Alcohol use: Never   Drug use: Never   Sexual activity: Not Currently  Other Topics Concern   Not on file  Social History Narrative   Lives at  home with his wife   Right handed   Caffeine: soft drinks 2-3 cups daily   Social Determinants of Health   Financial Resource Strain:    Difficulty of Paying Living Expenses:   Food Insecurity:    Worried About Charity fundraiser in the Last Year:    Arboriculturist in the Last Year:   Transportation Needs:    Film/video editor (Medical):    Lack of Transportation (Non-Medical):   Physical Activity:    Days of Exercise per Week:    Minutes of Exercise per Session:   Stress:    Feeling of Stress :   Social Connections:    Frequency of Communication with Friends and Family:     Frequency of Social Gatherings with Friends and Family:    Attends Religious Services:    Active Member of Clubs or Organizations:    Attends Music therapist:    Marital Status:   Intimate Partner Violence:    Fear of Current or Ex-Partner:    Emotionally Abused:    Physically Abused:    Sexually Abused:     Medications:   Current Outpatient Medications on File Prior to Visit  Medication Sig Dispense Refill   acetaminophen (TYLENOL) 325 MG tablet Take 2 tablets (650 mg total) by mouth every 6 (six) hours as needed for mild pain (or Fever >/= 101).     amLODipine (NORVASC) 5 MG tablet Take 1 tablet (5 mg total) by mouth daily. 30 tablet 0   aspirin 325 MG tablet Take 1 tablet (325 mg total) by mouth daily.     atorvastatin (LIPITOR) 80 MG tablet Take 1 tablet (80 mg total) by mouth daily. 90 tablet 0   Cholecalciferol (VITAMIN D-1000 MAX ST) 25 MCG (1000 UT) tablet Take 1 tablet (1,000 Units total) by mouth daily. 30 tablet 0   clopidogrel (PLAVIX) 75 MG tablet Take 1 tablet (75 mg total) by mouth daily. 90 tablet 4   cyanocobalamin ([employer] VITAMIN B12) 1000 MCG tablet Take 1 tablet (1,000 mcg total) by mouth daily. 30 tablet 0   fenofibrate 160 MG tablet Take 1 tablet (160 mg total) by mouth daily. 30 tablet 33   glipiZIDE (GLUCOTROL) 5 MG tablet Take 0.5 tablets (2.5 mg total) by mouth daily before breakfast. 30 tablet 0   hydrochlorothiazide (HYDRODIURIL) 25 MG tablet Take 1 tablet (25 mg total) by mouth daily. 30 tablet 0   metFORMIN (GLUCOPHAGE) 500 MG tablet Take 1 tablet (500 mg total) by mouth 2 (two) times daily with a meal. 60 tablet 0   Omega-3 Fatty Acids (FISH OIL) 1000 MG CAPS Take 1,000 mg by mouth daily with breakfast.     polyvinyl alcohol (LIQUIFILM TEARS) 1.4 % ophthalmic solution Place 1 drop into both eyes 4 (four) times daily as needed for dry eyes.     Semaglutide (OZEMPIC) 0.25 or 0.5 MG/DOSE SOPN Inject 0.25 mg into the skin once a  week.     senna-docusate (SENOKOT-S) 8.6-50 MG tablet Take 2 tablets by mouth at bedtime.     No current facility-administered medications on file prior to visit.    Allergies:  No Known Allergies  Physical Exam General: Mildly obese elderly Caucasian male, seated, in no evident distress Head: head normocephalic and atraumatic.  Neck: supple with no carotid or supraclavicular bruits Cardiovascular: regular rate and rhythm, no murmurs Musculoskeletal: no deformity Skin:  no rash/petichiae Vascular:  Normal pulses all extremities Vitals:   09/17/19  1512  BP: (!) 132/79  Pulse: 82   Neurologic Exam Mental Status: Awake and fully alert. Oriented to place and time. Recent and remote memory poor. Attention span, concentration and fund of knowledge diminished. Mood and affect appears flat.   Cranial Nerves: Fundoscopic exam reveals sharp disc margins. Pupils equal, briskly reactive to light. Extraocular movements full without nystagmus. Visual fields full to confrontation. Hearing intact. Facial sensation intact.  Mild right lower facial asymmetry when he smiles.  Tongue, palate moves normally and symmetrically.  Motor: No upper or lower extremity drift but increased tone on the left side with mild weakness of left grip and intrinsic hand muscles and orbits left over right upper extremity.  Mild weakness of right hip flexors and ankle dorsiflexors.   Sensory.: intact to touch ,pinprick .position and vibratory sensation.  Coordination: Mildly impaired left finger-to-nose and needle coordination. Gait and Station: Deferred as patient is in a wheelchair. Reflexes: 1+ and symmetric. Toes downgoing.   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  Multiple vascular risk factors of diabetes, hypertension, hyperlipidemia, peripheral arterial disease, obstructive sleep apnea, intracranial atherosclerosis.  He also has poststroke depression     PLAN: I had a long d/w patient and his wife about his recent strokes, post stroke depression,, risk for recurrent stroke/TIAs, personally independently reviewed imaging studies and stroke evaluation results and answered questions.Continue continue Plavix for stroke prevention and discontinue aspirin after a week as it would be more than 3 months of dual antiplatelet therapy for secondary stroke prevention and maintain strict control of hypertension with blood pressure goal below 130/90, diabetes with hemoglobin A1c goal below 6.5% and lipids with LDL cholesterol goal below 70 mg/dL. I also advised the patient to eat a healthy diet with plenty of whole grains, cereals, fruits and vegetables, exercise regularly and maintain ideal body weight.  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  Recommend outpatient physical and occupational therapy and patient encouraged to ambulate and walk as tolerated.  Followup in the future with my nurse practitioner Janett Billow in 3 months or call earlier if necessary. Greater than 50% of time during this 30 minute visit was spent on counseling,explanation of diagnosis of stroke, planning of further management, discussion with patient and family and coordination of care Antony Contras, MD  [facility] [address] [place], [place] [address]  Phone [phone] Fax [phone] Note: This document was prepared with digital dictation and possible smart phrase technology. Any transcriptional errors that result from this process are unintentional

## 2019-09-20 ENCOUNTER — Telehealth: Payer: Self-pay

## 2019-09-20 NOTE — Telephone Encounter (Signed)
Spoke to Judy(DPR) verified pt's name and DOB,   results given per provider, voiced understanding all question answered.

## 2019-09-20 NOTE — Progress Notes (Signed)
Kindly inform the patient that heart monitoring study did not show any significant evidence of worrisome arrhythmias.  No evidence of atrial fibrillation.

## 2019-09-20 NOTE — Telephone Encounter (Signed)
-----   Message from Garvin Fila, MD sent at 09/20/2019  9:19 AM EDT ----- Mitchell Heir inform the patient that heart monitoring study did not show any significant evidence of worrisome arrhythmias.  No evidence of atrial fibrillation.

## 2019-09-25 ENCOUNTER — Ambulatory Visit: Payer: Medicare HMO | Admitting: Cardiology

## 2019-09-25 ENCOUNTER — Encounter: Payer: Medicare HMO | Admitting: Physical Medicine & Rehabilitation

## 2019-10-09 ENCOUNTER — Other Ambulatory Visit: Payer: Self-pay

## 2019-10-09 ENCOUNTER — Ambulatory Visit: Payer: Medicare HMO | Attending: Neurology

## 2019-10-09 DIAGNOSIS — R2689 Other abnormalities of gait and mobility: Secondary | ICD-10-CM | POA: Insufficient documentation

## 2019-10-09 DIAGNOSIS — M6281 Muscle weakness (generalized): Secondary | ICD-10-CM | POA: Insufficient documentation

## 2019-10-09 DIAGNOSIS — R2681 Unsteadiness on feet: Secondary | ICD-10-CM

## 2019-10-09 NOTE — Therapy (Signed)
[facility] [address] [place], [place], [address] Phone: [phone]   Fax:  [phone]  Physical Therapy Evaluation  Patient Details  Name: Justin [name] MRN: [id number] Date of Birth: 05-05-1940 Referring Provider (PT): Antony Contras, MD   Encounter Date: 10/09/2019   PT End of Session - 10/09/19 1418    Visit Number 1    Number of Visits 17    Date for PT Re-Evaluation 01/07/20   POC for 8 weeks, Cert for 90 days   Authorization Type [employer] (Auth Required)    Progress Note Due on Visit 10    PT Start Time 1147    PT Stop Time 1230    PT Time Calculation (min) 43 min    Equipment Utilized During Treatment Gait belt    Activity Tolerance Patient tolerated treatment well    Behavior During Therapy [facility] for tasks assessed/performed           Past Medical History:  Diagnosis Date  . Brain bleed ([place])   . Cancer [facility])    prostate  . 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 Depression   . Diabetes mellitus without complication ([place])   . High cholesterol   . Hypertension   . Stroke [facility])     Past Surgical History:  Procedure Laterality Date  . arm & hand surgery Left 1972   skill saw accident  . COLON SURGERY     partial  . EYE SURGERY     lens implant and cateract- Left  . PROSTATECTOMY      There were no vitals filed for this visit.    Subjective Assessment - 10/09/19 1155    Subjective Patient was admitted to [facility] on 06/24/19 with 2 small acute infarcts in MCA. Patient refused to go to inpatient rehab and returned home as he did well and had no deficits.  He returned to the hospital on 07/16/2019 with new right leg weakness.  MRI at this time showed a left corona radiator centrum semiovale infarct with expected evolution of previous recent infarcts. Was discharged home and has been receiving home health services, which wife reported finished up with approx. 3 weeks ago. Since then he  has not being doing exercises or walking in the house frequently. Patient reports no falls since discharge and no pain. Wife reports that he is not walking throughout the house to go to bathroom and is sedentary throughout the day. Reports he is not conversating with her and reports some cognitive deficits since CVA. Also reports he has no urgency to use bathroom often wetting his depends multiple times a day and using the bathroom on himself at night despite having a urinal close by. Patient is currently ambulating with RW indoors and outdoors.    Patient is accompained by: Family member   Wife   Pertinent History history of CVA, HTN, hypercholesterolemia, diabetes, depression, bursitis in R hip, and intracranial hemorrhage.    Limitations Walking;Standing    How long can you walk comfortably? 10-15 minutes    Patient Stated Goals Patient wants to walk straight, get back to doing what was before    Currently in Pain? No/denies              [facility] PT Assessment - 10/09/19 0001      Assessment   Medical Diagnosis MCA CVA    Referring Provider (PT) Antony Contras, MD    Onset Date/Surgical Date 06/24/19  Hand Dominance Right    Prior Therapy Known to clinic for therapy (prior CVA) Inpatient Rehab, Home Health      Precautions   Precautions Fall      Balance Screen   Has the patient fallen in the past 6 months Yes    How many times? 8   wife reports 5 occured aroundt time of strokes; no injuries   Has the patient had a decrease in activity level because of a fear of falling?  Yes    Is the patient reluctant to leave their home because of a fear of falling?  Yes      [place] residence    Living Arrangements Spouse/significant other    Available Help at Discharge Family    Type of [place] to enter    Entrance Stairs-Number of Steps 2    [place] One level    [place] - 2  wheels;Shower seat;Transport chair;Grab bars - toilet;Grab bars - tub/shower   wife reports needing new shower seat     Prior Function   Level of Independence Independent    Vocation Retired    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 No reports of changes in sesnation      Coordination   Gross Motor Movements are Fluid and Coordinated No    Heel Shin Test increased difficulty      Posture/Postural Control   Posture/Postural Control Postural limitations    Postural Limitations Rounded Shoulders;Forward head      Tone   Assessment Location Right Lower Extremity      ROM / Strength   AROM / PROM / Strength AROM;Strength      AROM   Overall AROM  Deficits    Overall AROM Comments deficits in RLE due to weakness      Strength   Overall Strength Deficits    Strength Assessment Site Hip;Knee;Ankle    Right/Left Hip Right;Left    Right Hip Flexion 3+/5    Right Hip ABduction 3/5    Right Hip ADduction 3/5    Left Hip Flexion 4/5    Left Hip ABduction 4-/5    Left Hip ADduction 4/5    Right/Left Knee Right;Left    Right Knee Flexion 3+/5    Right Knee Extension 3+/5    Left Knee Flexion 4/5    Left Knee Extension 4/5    Right/Left Ankle Right;Left    Right Ankle Dorsiflexion 3-/5    Left Ankle Dorsiflexion 3+/5      Special Tests   Other special tests Wife reports decreased motivation and decreased compliance with HEP and walking within the home, and reports this is his last chance. PT educating to patient on importance of therapy for progress during this acute phase.       Bed Mobility   Bed Mobility Rolling Right;Rolling  Left;Supine to Sit;Sit to Supine    Rolling Right Independent   per patient/wife reports   Rolling Left Independent  per patient/wife reports   Supine to Sit Independent    Sit to Supine Minimal Assistance - Patient > 75%   sometimes require assistance with BLE per wife reports     Transfers   Transfers Sit to Stand;Stand to Sit    Sit to Stand 4: Min guard    Five time sit to stand comments  31.34 secs with UE support on knees from standard height chair    Stand to Sit 4: Min guard    Comments decreased control with descent noted      Ambulation/Gait   Ambulation/Gait Yes    Ambulation/Gait Assistance 4: Min guard    Ambulation/Gait Assistance Details With ambulation PT providing verbal cues for proper use of AD, as paitent demo safety concerns with RW as he is turning physically lifts RW and turns with it demonstrating increased balance challenge. Also tends to drag R foot and difficulty clearing with ambulation despite verbal cues    Ambulation Distance (Feet) 100 Feet    Assistive device Rolling walker    Gait Pattern Step-through pattern;Decreased arm swing - right;Decreased arm swing - left;Decreased step length - right;Decreased stance time - right;Decreased dorsiflexion - right;Decreased hip/knee flexion - right;Poor foot clearance - right   patient often drags R foot   Ambulation Surface Level;Indoor    Gait velocity 43.78 secs = 0.75 ft/sec    Gait Comments Patient easily distracted in therapy gym with activities      Standardized Balance Assessment   Standardized Balance Assessment Timed Up and Go Test      Timed Up and Go Test   TUG Normal TUG    Normal TUG (seconds) 31.57    TUG Comments with RW, verbal cues required for proper completion      RLE Tone   RLE Tone Within Functional Limits                      Objective measurements completed on examination: See above findings.               PT Education - 10/09/19 1414    Education Details  Educated on evaluation findings, POC. Importance of walking daily within the home and completing HEP    Person(s) Educated Patient;Spouse    Methods Explanation    Comprehension Verbalized understanding            PT Short Term Goals - 10/09/19 1435      PT SHORT TERM GOAL #1   Title Patient will be independent with HEP for strengthening/balance with wife superivison (ALL STGs Due: 11/06/19)    Baseline no HEP Established    Time 4    Period Weeks    Status New    Target Date 11/06/19      PT SHORT TERM GOAL #2   Title Pt will improve 5x sit<>stand to less than or equal to 25 seconds for improved transfer efficiency and safety.    Baseline 31.34 secs    Time 4    Period Weeks    Status New      PT SHORT TERM GOAL #3   Title Pt will improve TUG score to less than or equal to 20 seconds for decreased fall risk.    Baseline 31.57 secs w/ RW    Time 4    Period Weeks    Status New      PT SHORT TERM GOAL #4   Title Berg Balance to be Assessed and LTG  to be set as appropriate    Baseline Berg Balance baseline not established    Time 4    Period Weeks    Status New      PT SHORT TERM GOAL #5   Title Patient will demonstrate ability to ambulate >300 ft with LRAD and CGA for improved household mobility    Baseline 100 ft with Min Guard    Time 4    Period Weeks    Status New             PT Long Term Goals - 10/09/19 1438      PT LONG TERM GOAL #1   Title Patient will be independent with final HEP for improved strength and balance (ALL LTGs Due: 12/04/19)    Baseline no HEP established    Time 8    Period Weeks    Status New    Target Date 12/04/19      PT LONG TERM GOAL #2   Title Pt will improve gait velocity to at least 2.0 ft/sec for decreased fall risk, improved gait efficiency.    Time 8    Period Weeks    Status New      PT LONG TERM GOAL #3   Title LTG to be set as appropriate for [employer]    Baseline baseline not established    Time 8     Period Weeks    Status New      PT LONG TERM GOAL #4   Title Patient will improve TUG score to <15 seconds to demosntrate improved mobility and reduced fall risk    Baseline 31.57 secs    Time 8    Period Weeks    Status New      PT LONG TERM GOAL #5   Title Patient will improved 5x sit <> stand to </= 15 seconds to demonstrate improved functional mobility    Baseline 31.34 secs    Time 8    Period Weeks    Status New      Additional Long Term Goals   Additional Long Term Goals Yes      PT LONG TERM GOAL #6   Title Patient and wife will report understanding of fall prevention within the home    Baseline currently dependent    Time 8    Period Weeks    Status New                  Plan - 10/09/19 1429    Clinical Impression Statement Patient is a 79 y.o. male that was referred to neuro OPPT services for multiple CVAs with right sided weakness. Patient has received inpatient rehab and home health services, but wife reports decreased compliance. Patient's PMH is significant for the following: history of CVA, HTN, hypercholesterolemia, diabetes, depression, bursitis in R hip, and intracranial hemorrhage. Patient presents with the following impairments: decreased balance, decreased strength, abnormal gait, decreased coordination, decreased endurance, decreased functional mobility, and increased risk for falls. Patient is at an increased risk for falls with TUG score of 31.57 seconds. Patient is currently ambulating at gait speed of 0.79 ft/sec indicative of household ambulator. Patient will benefit from skilled PT services to maximize functional mobility, address impairments, and reduce fall risk.    Personal Factors and Comorbidities Behavior Pattern;Comorbidity 3+    Comorbidities history of CVA, HTN, hypercholesterolemia, diabetes, depression, bursitis in R hip, and intracranial hemorrhage.    Examination-Activity Limitations Bed  Mobility;Sit;Transfers;Stand;Toileting;Bathing;Dressing  Examination-Participation Restrictions Yard Work;Community Activity    Stability/Clinical Decision Making Evolving/Moderate complexity    Clinical Decision Making Moderate    Rehab Potential Fair    PT Frequency 2x / week    PT Duration 8 weeks    PT Treatment/Interventions ADLs/Self Care Home Management;Cryotherapy;Electrical Stimulation;Moist Heat;DME Instruction;Gait training;Functional mobility Scientist, forensic;Therapeutic activities;Therapeutic exercise;Neuromuscular re-education;Balance training;Patient/family education;Orthotic Fit/Training;Manual techniques;Passive range of motion    PT Next Visit Plan Assess Berg Balance, INitiate HEP    Recommended Other Services OT/ST (OT eval scheduled, obtaining referral for ST)    Consulted and Agree with Plan of Care Patient;Family member/caregiver    Family Member Consulted Wife           Patient will benefit from skilled therapeutic intervention in order to improve the following deficits and impairments:  Abnormal gait, Decreased endurance, Decreased balance, Decreased mobility, Difficulty walking, Hypomobility, Postural dysfunction, Pain, Decreased strength, Decreased safety awareness, Decreased knowledge of use of DME, Decreased coordination, Decreased activity tolerance, Decreased cognition, Decreased range of motion  Visit Diagnosis: Other abnormalities of gait and mobility  Unsteadiness on feet  Muscle weakness (generalized)     Problem List Patient Active Problem List   Diagnosis Date Noted  . Labile blood glucose   . 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 OSA (obstructive sleep apnea)   . Controlled type 2 diabetes mellitus with hyperglycemia, without long-term current use of insulin ([place])   . CVA (cerebral vascular accident) ([place]) 07/16/2019  . Dyslipidemia   . Benign essential HTN   . Diabetes mellitus type 2 in obese ([place])   . Major  depressive disorder, recurrent episode, moderate ([place])   . Gait disorder   . Acute right MCA stroke ([place]) 06/28/2019  . Ambulatory dysfunction   . Stroke ([place]) 06/24/2019  . Type 2 diabetes mellitus ([place]) 10/04/2018  . Depression 10/04/2018  . Acute CVA (cerebrovascular accident) ([place]) 09/18/2017  . Stroke (cerebrum) ([place]) 09/17/2017  . Diabetes mellitus without complication ([place]) 99/35/7017  . Closed head injury 04/22/2013  . HLD (hyperlipidemia) 07/29/2009  . Essential hypertension 07/29/2009  . EFFUSION, PLEURAL 07/29/2009  . DIVERTICULITIS, COLON 07/29/2009  . 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 COLONIC POLYPS, HYPERPLASTIC, HX OF 07/29/2009    Jones Bales, PT, DPT 10/09/2019, 2:42 PM  [facility] [address] [place], [place], [address] Phone: [phone]   Fax:  [phone]  Name: Justin [name] MRN: [id number] Date of Birth: 08-31-1940

## 2019-10-10 ENCOUNTER — Telehealth: Payer: Self-pay

## 2019-10-10 NOTE — Telephone Encounter (Signed)
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  The patient would benefit from Speech Therapy evaluation for cognitive deficits.   If you agree, please place an order in [facility]que in [facility] or fax the order to [phone].  Thank you, Guillermina [place], PT, [facility] [address] [place], [place]  [address] Phone:  [phone] Fax:  [phone]

## 2019-10-12 ENCOUNTER — Other Ambulatory Visit: Payer: Self-pay | Admitting: Neurology

## 2019-10-12 DIAGNOSIS — G3184 Mild cognitive impairment, so stated: Secondary | ICD-10-CM

## 2019-10-12 NOTE — Telephone Encounter (Signed)
Okay request placed

## 2019-10-16 ENCOUNTER — Ambulatory Visit: Payer: Medicare HMO | Admitting: Physical Therapy

## 2019-10-16 ENCOUNTER — Other Ambulatory Visit: Payer: Self-pay

## 2019-10-16 DIAGNOSIS — R2681 Unsteadiness on feet: Secondary | ICD-10-CM

## 2019-10-16 DIAGNOSIS — R2689 Other abnormalities of gait and mobility: Secondary | ICD-10-CM

## 2019-10-16 DIAGNOSIS — M6281 Muscle weakness (generalized): Secondary | ICD-10-CM

## 2019-10-16 NOTE — Patient Instructions (Signed)
Access Code: [email or web address] URL: [email or web address] Date: 10/16/2019 Prepared by: Janann August  Exercises Sit to Stand with Armchair - 2 x daily - 5 x weekly - 2 sets - 5 reps Seated March with Resistance - 2 x daily - 5 x weekly - 2 sets - 10 reps Seated Hamstring Curl with Anchored Resistance - 2 x daily - 5 x weekly - 2 sets - 10 reps Seated Heel Toe Raises - 2 x daily - 5 x weekly - 2 sets - 10 reps

## 2019-10-16 NOTE — Therapy (Signed)
[facility] [address] [place], [place], [address] Phone: [phone]   Fax:  [phone]  Physical Therapy Treatment  Patient Details  Name: Justin [name] MRN: [id number] Date of Birth: 1940/12/13 Referring Provider (PT): Antony Contras, MD   Encounter Date: 10/16/2019   PT End of Session - 10/16/19 1544    Visit Number 2    Number of Visits 17    Date for PT Re-Evaluation 01/07/20   POC for 8 weeks, Cert for 90 days   Authorization Type [employer] (Auth Required)    Progress Note Due on Visit 10    PT Start Time 1458   pt arrived late   PT Stop Time 1535    PT Time Calculation (min) 37 min    Equipment Utilized During Treatment Gait belt    Activity Tolerance Patient tolerated treatment well    Behavior During Therapy [facility] for tasks assessed/performed           Past Medical History:  Diagnosis Date  . Brain bleed ([place])   . Cancer [facility])    prostate  . 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 Depression   . Diabetes mellitus without complication ([place])   . High cholesterol   . Hypertension   . Stroke [facility])     Past Surgical History:  Procedure Laterality Date  . arm & hand surgery Left 1972   skill saw accident  . COLON SURGERY     partial  . EYE SURGERY     lens implant and cateract- Left  . PROSTATECTOMY      There were no vitals filed for this visit.   Subjective Assessment - 10/16/19 1505    Subjective No changes/no falls since her was here for his eval.    Patient is accompained by: Family member   Wife   Pertinent History history of CVA, HTN, hypercholesterolemia, diabetes, depression, bursitis in R hip, and intracranial hemorrhage.    Limitations Walking;Standing    How long can you walk comfortably? 10-15 minutes    Patient Stated Goals Patient wants to walk straight, get back to doing what was before    Currently in Pain? No/denies              [facility] PT Assessment -  10/16/19 1507      Standardized Balance Assessment   Standardized Balance Assessment Berg Balance Test      Berg Balance Test   Sit to Stand Able to stand without using hands and stabilize independently    Standing Unsupported Able to stand safely 2 minutes    Sitting with Back Unsupported but Feet Supported on Floor or Stool Able to sit safely and securely 2 minutes    Stand to Sit Sits safely with minimal use of hands    Transfers Able to transfer with verbal cueing and /or supervision    Standing Unsupported with Eyes Closed Able to stand 10 seconds with supervision    Standing Unsupported with Feet Together Able to place feet together independently and stand for 1 minute with supervision    From Standing, Reach Forward with Outstretched Arm Can reach confidently >25 cm (10")    From Standing Position, Pick up Object from Floor Able to pick up shoe, needs supervision    From Standing Position, Turn to Look Behind Over each Shoulder Looks behind one side only/other side shows less weight shift    Turn 360 Degrees Needs assistance  while turning    Standing Unsupported, Alternately Place Feet on Step/Stool Needs assistance to keep from falling or unable to try   holds onto RW   Standing Unsupported, One Foot in Front Able to take small step independently and hold 30 seconds    Standing on One Leg Unable to try or needs assist to prevent fall    Total Score 36    Berg comment: 36/56             Access Code: [email or web address] URL: [email or web address] Date: 10/16/2019 Prepared by: Janann August   Initiated HEP, wife present to assess technique and give proper cues:   Exercises Sit to Stand with Armchair - 2 x daily - 5 x weekly - 2 sets - 5 reps - needs cues for proper UE placement and to scoot out towards the edge of the mat  Seated March with Resistance - 2 x daily - 5 x weekly - 2 sets - 10 reps - using green theraband  Seated Hamstring Curl with Anchored Resistance -  2 x daily - 5 x weekly - 2 sets - 10 reps - green theraband  Seated Heel Toe Raises - 2 x daily - 5 x weekly - 2 sets - 10 reps             [facility] Adult PT Treatment/Exercise - 10/16/19 0001      Transfers   Transfers Sit to Stand;Stand to Sit    Sit to Stand 4: Min guard;Without upper extremity assist;From bed    Stand to Sit 5: Supervision;With upper extremity assist;To bed    Comments requires cues for proper UE placement when performing       Ambulation/Gait   Ambulation/Gait Yes    Ambulation/Gait Assistance 4: Min guard    Ambulation/Gait Assistance Details cues for incr foot clearance with RLE and to attend to gait (pt with tendency to become easily distracted and look around therapy gym), pt initially self cueing saying "horse kick" for foot cleraance, pt responds well to this cue for incr R foot clearance and heel strike    Ambulation Distance (Feet) 100 Feet   x2   Assistive device Rolling walker    Gait Pattern Step-through pattern;Decreased arm swing - right;Decreased arm swing - left;Decreased step length - right;Decreased stance time - right;Decreased dorsiflexion - right;Decreased hip/knee flexion - right;Poor foot clearance - right   drags R foot, R foot externally rotated   Ambulation Surface Level;Indoor                  PT Education - 10/16/19 1544    Education Details initial HEP, scheduling for ST eval    Person(s) Educated Patient;Spouse    Methods Explanation;Demonstration;Handout    Comprehension Verbalized understanding;Returned demonstration;Need further instruction            PT Short Term Goals - 10/16/19 1551      PT SHORT TERM GOAL #1   Title Patient will be independent with HEP for strengthening/balance with wife superivison (ALL STGs Due: 11/06/19)    Baseline no HEP Established    Time 4    Period Weeks    Status New    Target Date 11/06/19      PT SHORT TERM GOAL #2   Title Pt will improve 5x sit<>stand to less than or equal  to 25 seconds for improved transfer efficiency and safety.    Baseline 31.34 secs    Time 4    Period Weeks  Status New      PT SHORT TERM GOAL #3   Title Pt will improve TUG score to less than or equal to 20 seconds for decreased fall risk.    Baseline 31.57 secs w/ RW    Time 4    Period Weeks    Status New      PT SHORT TERM GOAL #4   Title Berg Balance to be Assessed and LTG to be set as appropriate    Baseline 36/56 on 10/16/19    Time 4    Period Weeks    Status Achieved      PT SHORT TERM GOAL #5   Title Patient will demonstrate ability to ambulate >300 ft with LRAD and CGA for improved household mobility    Baseline 100 ft with Min Guard    Time 4    Period Weeks    Status New             PT Long Term Goals - 10/16/19 1552      PT LONG TERM GOAL #1   Title Patient will be independent with final HEP for improved strength and balance (ALL LTGs Due: 12/04/19)    Baseline no HEP established    Time 8    Period Weeks    Status New      PT LONG TERM GOAL #2   Title Pt will improve gait velocity to at least 2.0 ft/sec for decreased fall risk, improved gait efficiency.    Time 8    Period Weeks    Status New      PT LONG TERM GOAL #3   Title Pt will improve BERG to at least a 41/56 to demo decr fall risk.    Baseline 36/56    Time 8    Period Weeks    Status Revised      PT LONG TERM GOAL #4   Title Patient will improve TUG score to <15 seconds to demosntrate improved mobility and reduced fall risk    Baseline 31.57 secs    Time 8    Period Weeks    Status New      PT LONG TERM GOAL #5   Title Patient will improved 5x sit <> stand to </= 15 seconds to demonstrate improved functional mobility    Baseline 31.34 secs    Time 8    Period Weeks    Status New      PT LONG TERM GOAL #6   Title Patient and wife will report understanding of fall prevention within the home    Baseline currently dependent    Time 8    Period Weeks    Status New                  Plan - 10/16/19 1549    Clinical Impression Statement Pt easily distractable today when in a busy therapy environment and needs frequent verbal cues to attend to the task at hand. Per pt's spouse, pt's balance is also worse when pt is easily distracted. Performed the BERG with pt scoring a 36/56, indicating that pt is at a high risk for falls. Initiated HEP today with focus on sit <> stand and seated strengthening exercises. Will continue to progess towards LTGs.    Personal Factors and Comorbidities Behavior Pattern;Comorbidity 3+    Comorbidities history of CVA, HTN, hypercholesterolemia, diabetes, depression, bursitis in R hip, and intracranial hemorrhage.    Examination-Activity Limitations Bed Mobility;Sit;Transfers;Stand;Toileting;Bathing;Dressing  Examination-Participation Restrictions Yard Work;Community Activity    Stability/Clinical Decision Making Evolving/Moderate complexity    Rehab Potential Fair    PT Frequency 2x / week    PT Duration 8 weeks    PT Treatment/Interventions ADLs/Self Care Home Management;Cryotherapy;Electrical Stimulation;Moist Heat;DME Instruction;Gait training;Functional mobility Scientist, forensic;Therapeutic activities;Therapeutic exercise;Neuromuscular re-education;Balance training;Patient/family education;Orthotic Fit/Training;Manual techniques;Passive range of motion    PT Next Visit Plan add to HEP as appropriate, seated/standing balance and strengthening, sit <> stand training    Consulted and Agree with Plan of Care Patient;Family member/caregiver    Family Member Consulted Wife           Patient will benefit from skilled therapeutic intervention in order to improve the following deficits and impairments:  Abnormal gait, Decreased endurance, Decreased balance, Decreased mobility, Difficulty walking, Hypomobility, Postural dysfunction, Pain, Decreased strength, Decreased safety awareness, Decreased knowledge of use of DME, Decreased  coordination, Decreased activity tolerance, Decreased cognition, Decreased range of motion  Visit Diagnosis: Other abnormalities of gait and mobility  Unsteadiness on feet  Muscle weakness (generalized)     Problem List Patient Active Problem List   Diagnosis Date Noted  . Labile blood glucose   . 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 OSA (obstructive sleep apnea)   . Controlled type 2 diabetes mellitus with hyperglycemia, without long-term current use of insulin ([place])   . CVA (cerebral vascular accident) ([place]) 07/16/2019  . Dyslipidemia   . Benign essential HTN   . Diabetes mellitus type 2 in obese ([place])   . Major depressive disorder, recurrent episode, moderate ([place])   . Gait disorder   . Acute right MCA stroke ([place]) 06/28/2019  . Ambulatory dysfunction   . Stroke ([place]) 06/24/2019  . Type 2 diabetes mellitus ([place]) 10/04/2018  . Depression 10/04/2018  . Acute CVA (cerebrovascular accident) ([place]) 09/18/2017  . Stroke (cerebrum) ([place]) 09/17/2017  . Diabetes mellitus without complication ([place]) 87/57/9728  . Closed head injury 04/22/2013  . HLD (hyperlipidemia) 07/29/2009  . Essential hypertension 07/29/2009  . EFFUSION, PLEURAL 07/29/2009  . DIVERTICULITIS, COLON 07/29/2009  . 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 COLONIC POLYPS, HYPERPLASTIC, HX OF 07/29/2009    Arliss Journey, PT, DPT  10/16/2019, 3:53 PM  [facility] [address] [place], [place], [address] Phone: [phone]   Fax:  [phone]  Name: Justin [name] MRN: [id number] Date of Birth: [birth year]

## 2019-10-18 NOTE — Telephone Encounter (Signed)
Thank you :)

## 2019-10-24 ENCOUNTER — Other Ambulatory Visit: Payer: Self-pay

## 2019-10-24 ENCOUNTER — Ambulatory Visit: Payer: Medicare HMO | Attending: Neurology

## 2019-10-24 DIAGNOSIS — R41840 Attention and concentration deficit: Secondary | ICD-10-CM | POA: Diagnosis present

## 2019-10-24 DIAGNOSIS — R2689 Other abnormalities of gait and mobility: Secondary | ICD-10-CM | POA: Insufficient documentation

## 2019-10-24 DIAGNOSIS — R41841 Cognitive communication deficit: Secondary | ICD-10-CM | POA: Diagnosis present

## 2019-10-24 DIAGNOSIS — R2681 Unsteadiness on feet: Secondary | ICD-10-CM

## 2019-10-24 DIAGNOSIS — M6281 Muscle weakness (generalized): Secondary | ICD-10-CM | POA: Diagnosis present

## 2019-10-24 DIAGNOSIS — R4701 Aphasia: Secondary | ICD-10-CM | POA: Insufficient documentation

## 2019-10-24 NOTE — Therapy (Signed)
[facility] [address] [place], [place], [address] Phone: [phone]   Fax:  [phone]  Physical Therapy Treatment  Patient Details  Name: Justin [name] MRN: [id number] Date of Birth: Dec 21, 1940 Referring Provider (PT): Antony Contras, MD   Encounter Date: 10/24/2019   PT End of Session - 10/24/19 1539    Visit Number 3    Number of Visits 17    Date for PT Re-Evaluation 01/07/20   POC for 8 weeks, Cert for 90 days   Authorization Type [employer] (Auth Required)    Progress Note Due on Visit 10    PT Start Time 1530    PT Stop Time 1614    PT Time Calculation (min) 44 min    Equipment Utilized During Treatment Gait belt    Activity Tolerance Patient tolerated treatment well    Behavior During Therapy [facility] for tasks assessed/performed           Past Medical History:  Diagnosis Date  . Brain bleed ([place])   . Cancer [facility])    prostate  . 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 Depression   . Diabetes mellitus without complication ([place])   . High cholesterol   . Hypertension   . Stroke [facility])     Past Surgical History:  Procedure Laterality Date  . arm & hand surgery Left 1972   skill saw accident  . COLON SURGERY     partial  . EYE SURGERY     lens implant and cateract- Left  . PROSTATECTOMY      There were no vitals filed for this visit.   Subjective Assessment - 10/24/19 1535    Subjective Patient and wife reports had a fall going in/out of the door while was was not home, patient stayed on floor till wife got home. Also had a second fall, where he went to sit down and the chair was not behind him appropriately and fell in the floor. had to have assitance from fire department for both to get up out of the floor. No pain.    Patient is accompained by: Family member   Wife   Pertinent History history of CVA, HTN, hypercholesterolemia, diabetes, depression, bursitis in R hip, and  intracranial hemorrhage.    Limitations Walking;Standing    How long can you walk comfortably? 10-15 minutes    Patient Stated Goals Patient wants to walk straight, get back to doing what was before    Currently in Pain? No/denies                             [facility] Adult PT Treatment/Exercise - 10/24/19 0001      Transfers   Transfers Sit to Stand;Stand to Sit    Sit to Stand 4: Min guard;Without upper extremity assist;From bed    Stand to Sit 5: Supervision;With upper extremity assist;To bed    Comments completed x 10 reps throughout session with activites without UE support. continued verbal cues required for UE placement.       Ambulation/Gait   Ambulation/Gait Yes    Ambulation/Gait Assistance 4: Min guard    Ambulation/Gait Assistance Details ambulating into/out of session with RW, PT providing verbal cues for improved step length with patient often responding better with simple command of "kick". Patient demo increased distraction when ambulating into/out of session.    Ambulation Distance (Feet) 100 Feet  Assistive device Rolling walker    Gait Pattern Step-through pattern;Decreased arm swing - right;Decreased arm swing - left;Decreased step length - right;Decreased stance time - right;Decreased dorsiflexion - right;Decreased hip/knee flexion - right;Poor foot clearance - right    Ambulation Surface Level;Indoor    Gait Comments Completed ambulation from mat <> standard chair with 10 ft positioned between. Working on completing turns with RW and keeping R foot positioned within RW with completion. Completed x 4 reps. Also practicing due to patient having a fall due to not having chair aligned behind him prior to descent. PT providing verbal cues to make sure both knees are touching surface prior to descent to ensure that surface is behind patient.       Neuro Re-ed    Neuro Re-ed Details  Seated on green air disc, completed alternating marching x 10 reps without  UE support and vebral cues to maintain balance using core. completed LAQ x 15 reps bilaterally also seated on green disc, alternating BLE working on maintaining balance. Increased difficutly with completion of LAQ on RLE. Completed standing balance without support but RW placed in front if needed, completed bilateral shoulder flexion x 10 reps to further challenge balance and stability. Increased verbal cues required for proper completion. Completed standing reaching task without UE support, 10 cones placed in front of patient on table initially with reaching forward to grab cones, increased difficulty note with cones at further distance. Progressed to placing cones on R and L side, and having to reach with opposite arm across midline and turn and place on mat for further balance challenge. verbal cues required for posture with completion, as well as cueing to ensure proper completion of task.                   PT Education - 10/24/19 1705    Education Details Educated on ensuring both legs are touching surface prior to descent to further reduce fall risk.    Person(s) Educated Patient;Spouse    Methods Explanation;Demonstration    Comprehension Verbalized understanding;Returned demonstration            PT Short Term Goals - 10/16/19 1551      PT SHORT TERM GOAL #1   Title Patient will be independent with HEP for strengthening/balance with wife superivison (ALL STGs Due: 11/06/19)    Baseline no HEP Established    Time 4    Period Weeks    Status New    Target Date 11/06/19      PT SHORT TERM GOAL #2   Title Pt will improve 5x sit<>stand to less than or equal to 25 seconds for improved transfer efficiency and safety.    Baseline 31.34 secs    Time 4    Period Weeks    Status New      PT SHORT TERM GOAL #3   Title Pt will improve TUG score to less than or equal to 20 seconds for decreased fall risk.    Baseline 31.57 secs w/ RW    Time 4    Period Weeks    Status New       PT SHORT TERM GOAL #4   Title Berg Balance to be Assessed and LTG to be set as appropriate    Baseline 36/56 on 10/16/19    Time 4    Period Weeks    Status Achieved      PT SHORT TERM GOAL #5   Title Patient will demonstrate ability to  ambulate >300 ft with LRAD and CGA for improved household mobility    Baseline 100 ft with Min Guard    Time 4    Period Weeks    Status New             PT Long Term Goals - 10/16/19 1552      PT LONG TERM GOAL #1   Title Patient will be independent with final HEP for improved strength and balance (ALL LTGs Due: 12/04/19)    Baseline no HEP established    Time 8    Period Weeks    Status New      PT LONG TERM GOAL #2   Title Pt will improve gait velocity to at least 2.0 ft/sec for decreased fall risk, improved gait efficiency.    Time 8    Period Weeks    Status New      PT LONG TERM GOAL #3   Title Pt will improve BERG to at least a 41/56 to demo decr fall risk.    Baseline 36/56    Time 8    Period Weeks    Status Revised      PT LONG TERM GOAL #4   Title Patient will improve TUG score to <15 seconds to demosntrate improved mobility and reduced fall risk    Baseline 31.57 secs    Time 8    Period Weeks    Status New      PT LONG TERM GOAL #5   Title Patient will improved 5x sit <> stand to </= 15 seconds to demonstrate improved functional mobility    Baseline 31.34 secs    Time 8    Period Weeks    Status New      PT LONG TERM GOAL #6   Title Patient and wife will report understanding of fall prevention within the home    Baseline currently dependent    Time 8    Period Weeks    Status New                 Plan - 10/24/19 1706    Clinical Impression Statement Today's session completed in therapy room due to patients difficulty with attention in busy environment. Completed seated and standing dynamic balance activites today with patient toleraring well, increased verbal cues required and simple commands for proper  completion. Completed gait training with RW with focus on turning and aligning to surface plan to sit on to help reduce risk for falls. Will continue to progress toward goals.    Personal Factors and Comorbidities Behavior Pattern;Comorbidity 3+    Comorbidities history of CVA, HTN, hypercholesterolemia, diabetes, depression, bursitis in R hip, and intracranial hemorrhage.    Examination-Activity Limitations Bed Mobility;Sit;Transfers;Stand;Toileting;Bathing;Dressing    Examination-Participation Restrictions [employer] Activity    Stability/Clinical Decision Making Evolving/Moderate complexity    Rehab Potential Fair    PT Frequency 2x / week    PT Duration 8 weeks    PT Treatment/Interventions ADLs/Self Care Home Management;Cryotherapy;Electrical Stimulation;Moist Heat;DME Instruction;Gait training;Functional mobility Scientist, forensic;Therapeutic activities;Therapeutic exercise;Neuromuscular re-education;Balance training;Patient/family education;Orthotic Fit/Training;Manual techniques;Passive range of motion    PT Next Visit Plan add to HEP as appropriate, seated/standing balance and strengthening, sit <> stand training, gait (maybe try colored bands for target). Complete session in therapy room if able for improved participation.    Consulted and Agree with Plan of Care Patient;Family member/caregiver    Family Member Consulted Wife  Patient will benefit from skilled therapeutic intervention in order to improve the following deficits and impairments:  Abnormal gait, Decreased endurance, Decreased balance, Decreased mobility, Difficulty walking, Hypomobility, Postural dysfunction, Pain, Decreased strength, Decreased safety awareness, Decreased knowledge of use of DME, Decreased coordination, Decreased activity tolerance, Decreased cognition, Decreased range of motion  Visit Diagnosis: Other abnormalities of gait and mobility  Unsteadiness on feet  Muscle weakness  (generalized)     Problem List Patient Active Problem List   Diagnosis Date Noted  . Labile blood glucose   . 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 OSA (obstructive sleep apnea)   . Controlled type 2 diabetes mellitus with hyperglycemia, without long-term current use of insulin ([place])   . CVA (cerebral vascular accident) ([place]) 07/16/2019  . Dyslipidemia   . Benign essential HTN   . Diabetes mellitus type 2 in obese ([place])   . Major depressive disorder, recurrent episode, moderate ([place])   . Gait disorder   . Acute right MCA stroke ([place]) 06/28/2019  . Ambulatory dysfunction   . Stroke ([place]) 06/24/2019  . Type 2 diabetes mellitus ([place]) 10/04/2018  . Depression 10/04/2018  . Acute CVA (cerebrovascular accident) ([place]) 09/18/2017  . Stroke (cerebrum) ([place]) 09/17/2017  . Diabetes mellitus without complication ([place]) 46/95/0722  . Closed head injury 04/22/2013  . HLD (hyperlipidemia) 07/29/2009  . Essential hypertension 07/29/2009  . EFFUSION, PLEURAL 07/29/2009  . DIVERTICULITIS, COLON 07/29/2009  . 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 COLONIC POLYPS, HYPERPLASTIC, HX OF 07/29/2009    Jones Bales, PT, DPT 10/24/2019, 5:10 PM  [facility] [address] [place], [place], [address] Phone: [phone]   Fax:  [phone]  Name: Justin [name] MRN: [id number] Date of Birth: 12-Mar-1940

## 2019-10-29 NOTE — Telephone Encounter (Signed)
Thanks he has post stroke depression and have stated zoloft. Will reevaluate at f/u visit

## 2019-10-31 ENCOUNTER — Ambulatory Visit: Payer: Medicare HMO | Admitting: Physical Therapy

## 2019-10-31 ENCOUNTER — Other Ambulatory Visit: Payer: Self-pay

## 2019-10-31 DIAGNOSIS — M6281 Muscle weakness (generalized): Secondary | ICD-10-CM

## 2019-10-31 DIAGNOSIS — R2681 Unsteadiness on feet: Secondary | ICD-10-CM

## 2019-10-31 DIAGNOSIS — R2689 Other abnormalities of gait and mobility: Secondary | ICD-10-CM | POA: Diagnosis not present

## 2019-10-31 NOTE — Therapy (Signed)
[facility] [address] [place], [place], [address] Phone: [phone]   Fax:  [phone]  Physical Therapy Treatment  Patient Details  Name: Justin [name] MRN: [id number] Date of Birth: [birth year] Referring Provider (PT): Antony Contras, MD   Encounter Date: 10/31/2019   PT End of Session - 10/31/19 1433    Visit Number 4    Number of Visits 17    Date for PT Re-Evaluation 01/07/20   POC for 8 weeks, Cert for 90 days   Authorization Type [employer] (Auth Required)    Progress Note Due on Visit 10    PT Start Time 1340   able to start pt early due to cancellation   PT Stop Time 1423    PT Time Calculation (min) 43 min    Equipment Utilized During Treatment Gait belt    Activity Tolerance Patient tolerated treatment well    Behavior During Therapy [facility] for tasks assessed/performed           Past Medical History:  Diagnosis Date  . Brain bleed ([place])   . Cancer [facility])    prostate  . 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 Depression   . Diabetes mellitus without complication ([place])   . High cholesterol   . Hypertension   . Stroke [facility])     Past Surgical History:  Procedure Laterality Date  . arm & hand surgery Left 1972   skill saw accident  . COLON SURGERY     partial  . EYE SURGERY     lens implant and cateract- Left  . PROSTATECTOMY      There were no vitals filed for this visit.   Subjective Assessment - 10/31/19 1345    Subjective No falls since he was last here. Wife states that he only does exercises sometimes at home, pt gets agitated.    Patient is accompained by: Family member   Wife   Pertinent History history of CVA, HTN, hypercholesterolemia, diabetes, depression, bursitis in R hip, and intracranial hemorrhage.    Limitations Walking;Standing    How long can you walk comfortably? 10-15 minutes    Patient Stated Goals Patient wants to walk straight, get back to doing what was  before    Currently in Pain? No/denies                             [facility] Adult PT Treatment/Exercise - 10/31/19 0001      Transfers   Transfers Sit to Stand;Stand to Sit    Sit to Stand 4: Min guard    Stand to Sit 5: Supervision;With upper extremity assist;To bed    Comments completed x6 reps of sit <> stands with cues for incr forward weight shift (nose over toes), plus additional reps throughout session between activities       Ambulation/Gait   Ambulation/Gait Yes    Ambulation/Gait Assistance 4: Min guard    Ambulation/Gait Assistance Details ambulating into and out of session with RW, pt needing cues to "kick" with RLE for incr step length with RLE. pt easily distractable when ambulating near therapy gym and needs verbal cues to attend to the task of gait and to look straight ahead where he is walking    Ambulation Distance (Feet) 100 Feet   x2   Assistive device Rolling walker    Gait Pattern Step-through pattern;Decreased arm swing - right;Decreased arm swing -  left;Decreased step length - right;Decreased stance time - right;Decreased dorsiflexion - right;Decreased hip/knee flexion - right;Poor foot clearance - right    Ambulation Surface Indoor;Level      Exercises   Exercises Other Exercises    Other Exercises  mini squats in RW x5 reps with BUE support, cues for proper technique. seated at edge of mat table: x10 reps hip add ball squeezes, with green tband around thighs x10 reps hip ABD, 3 x 5 reps seated marching with green tband with RLE only with visual cue on how high to lift leg      Knee/Hip Exercises: Standing   Other Standing Knee Exercises with BUE support at RW: 2 x 10 reps with RLE foot taps to 4" step, cues for incr foot clearance with RLE as pt with tendency to scuff R foot when stepping it off , then performed with LLE tapping 4" step for incr weight shifting and bearing to RLE                  PT Education - 10/31/19 1433     Education Details importance of using a RW at all times in order to decr fall risk (pt asking during session if he can start using a walking stick)    Person(s) Educated Patient;Spouse    Methods Explanation    Comprehension Verbalized understanding            PT Short Term Goals - 10/16/19 1551      PT SHORT TERM GOAL #1   Title Patient will be independent with HEP for strengthening/balance with wife superivison (ALL STGs Due: 11/06/19)    Baseline no HEP Established    Time 4    Period Weeks    Status New    Target Date 11/06/19      PT SHORT TERM GOAL #2   Title Pt will improve 5x sit<>stand to less than or equal to 25 seconds for improved transfer efficiency and safety.    Baseline 31.34 secs    Time 4    Period Weeks    Status New      PT SHORT TERM GOAL #3   Title Pt will improve TUG score to less than or equal to 20 seconds for decreased fall risk.    Baseline 31.57 secs w/ RW    Time 4    Period Weeks    Status New      PT SHORT TERM GOAL #4   Title Berg Balance to be Assessed and LTG to be set as appropriate    Baseline 36/56 on 10/16/19    Time 4    Period Weeks    Status Achieved      PT SHORT TERM GOAL #5   Title Patient will demonstrate ability to ambulate >300 ft with LRAD and CGA for improved household mobility    Baseline 100 ft with Min Guard    Time 4    Period Weeks    Status New             PT Long Term Goals - 10/16/19 1552      PT LONG TERM GOAL #1   Title Patient will be independent with final HEP for improved strength and balance (ALL LTGs Due: 12/04/19)    Baseline no HEP established    Time 8    Period Weeks    Status New      PT LONG TERM GOAL #2   Title Pt will improve  gait velocity to at least 2.0 ft/sec for decreased fall risk, improved gait efficiency.    Time 8    Period Weeks    Status New      PT LONG TERM GOAL #3   Title Pt will improve BERG to at least a 41/56 to demo decr fall risk.    Baseline 36/56    Time 8     Period Weeks    Status Revised      PT LONG TERM GOAL #4   Title Patient will improve TUG score to <15 seconds to demosntrate improved mobility and reduced fall risk    Baseline 31.57 secs    Time 8    Period Weeks    Status New      PT LONG TERM GOAL #5   Title Patient will improved 5x sit <> stand to </= 15 seconds to demonstrate improved functional mobility    Baseline 31.34 secs    Time 8    Period Weeks    Status New      PT LONG TERM GOAL #6   Title Patient and wife will report understanding of fall prevention within the home    Baseline currently dependent    Time 8    Period Weeks    Status New                 Plan - 10/31/19 1434    Clinical Impression Statement Performed today's session in a private treatment room due to pt being easily distracted in a busy therapy gym environment - pt tolerated session well in room. Focused on BLE strengthening (standing and seated) - with pt needing intermittent seated rest breaks. Will continue to progress towards LTGs.    Personal Factors and Comorbidities Behavior Pattern;Comorbidity 3+    Comorbidities history of CVA, HTN, hypercholesterolemia, diabetes, depression, bursitis in R hip, and intracranial hemorrhage.    Examination-Activity Limitations Bed Mobility;Sit;Transfers;Stand;Toileting;Bathing;Dressing    Examination-Participation Restrictions [employer] Activity    Stability/Clinical Decision Making Evolving/Moderate complexity    Rehab Potential Fair    PT Frequency 2x / week    PT Duration 8 weeks    PT Treatment/Interventions ADLs/Self Care Home Management;Cryotherapy;Electrical Stimulation;Moist Heat;DME Instruction;Gait training;Functional mobility Scientist, forensic;Therapeutic activities;Therapeutic exercise;Neuromuscular re-education;Balance training;Patient/family education;Orthotic Fit/Training;Manual techniques;Passive range of motion    PT Next Visit Plan add to HEP as appropriate,  seated/standing balance and strengthening, sit <> stand training, gait (maybe try colored bands for target). Complete session in therapy room if able for improved participation.    Consulted and Agree with Plan of Care Patient;Family member/caregiver    Family Member Consulted Wife           Patient will benefit from skilled therapeutic intervention in order to improve the following deficits and impairments:  Abnormal gait, Decreased endurance, Decreased balance, Decreased mobility, Difficulty walking, Hypomobility, Postural dysfunction, Pain, Decreased strength, Decreased safety awareness, Decreased knowledge of use of DME, Decreased coordination, Decreased activity tolerance, Decreased cognition, Decreased range of motion  Visit Diagnosis: Other abnormalities of gait and mobility  Unsteadiness on feet  Muscle weakness (generalized)     Problem List Patient Active Problem List   Diagnosis Date Noted  . Labile blood glucose   . 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 OSA (obstructive sleep apnea)   . Controlled type 2 diabetes mellitus with hyperglycemia, without long-term current use of insulin ([place])   . CVA (cerebral vascular accident) ([place]) 07/16/2019  . Dyslipidemia   .  Benign essential HTN   . Diabetes mellitus type 2 in obese ([place])   . Major depressive disorder, recurrent episode, moderate ([place])   . Gait disorder   . Acute right MCA stroke ([place]) 06/28/2019  . Ambulatory dysfunction   . Stroke ([place]) 06/24/2019  . Type 2 diabetes mellitus ([place]) 10/04/2018  . Depression 10/04/2018  . Acute CVA (cerebrovascular accident) ([place]) 09/18/2017  . Stroke (cerebrum) ([place]) 09/17/2017  . Diabetes mellitus without complication ([place]) 51/83/4373  . Closed head injury 04/22/2013  . HLD (hyperlipidemia) 07/29/2009  . Essential hypertension 07/29/2009  . EFFUSION, PLEURAL 07/29/2009  . DIVERTICULITIS, COLON 07/29/2009  . 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 COLONIC POLYPS,  HYPERPLASTIC, HX OF 07/29/2009    Arliss Journey, PT, DPT  10/31/2019, 2:53 PM  [facility] [address] [place], [place], [address] Phone: [phone]   Fax:  [phone]  Name: Justin [name] MRN: [id number] Date of Birth: 01/27/1941

## 2019-11-01 ENCOUNTER — Other Ambulatory Visit: Payer: Self-pay | Admitting: Neurology

## 2019-11-02 ENCOUNTER — Ambulatory Visit: Payer: Medicare HMO

## 2019-11-06 ENCOUNTER — Encounter: Payer: Self-pay | Admitting: Occupational Therapy

## 2019-11-06 ENCOUNTER — Ambulatory Visit: Payer: Medicare HMO | Admitting: Speech Pathology

## 2019-11-06 ENCOUNTER — Other Ambulatory Visit: Payer: Self-pay

## 2019-11-06 ENCOUNTER — Ambulatory Visit: Payer: Medicare HMO

## 2019-11-06 ENCOUNTER — Ambulatory Visit: Payer: Medicare HMO | Admitting: Occupational Therapy

## 2019-11-06 DIAGNOSIS — R2681 Unsteadiness on feet: Secondary | ICD-10-CM

## 2019-11-06 DIAGNOSIS — R2689 Other abnormalities of gait and mobility: Secondary | ICD-10-CM | POA: Diagnosis not present

## 2019-11-06 DIAGNOSIS — M6281 Muscle weakness (generalized): Secondary | ICD-10-CM

## 2019-11-06 DIAGNOSIS — R41841 Cognitive communication deficit: Secondary | ICD-10-CM

## 2019-11-06 DIAGNOSIS — R41840 Attention and concentration deficit: Secondary | ICD-10-CM

## 2019-11-06 NOTE — Therapy (Signed)
[facility] [address] [place], [place], [address] Phone: [phone]   Fax:  [phone]  Physical Therapy Treatment  Patient Details  Name: Justin [name] MRN: [id number] Date of Birth: 27-Jan-1941 Referring Provider (PT): Antony Contras, MD   Encounter Date: 11/06/2019   PT End of Session - 11/06/19 1154    Visit Number 5    Number of Visits 17    Date for PT Re-Evaluation 01/07/20   POC for 8 weeks, Cert for 90 days   Authorization Type [employer] (Auth Required)    Progress Note Due on Visit 10    PT Start Time 1152   pt needing to use bathroom prior to session   PT Stop Time 1229    PT Time Calculation (min) 37 min    Equipment Utilized During Treatment Gait belt    Activity Tolerance Patient tolerated treatment well    Behavior During Therapy [facility] for tasks assessed/performed           Past Medical History:  Diagnosis Date  . Brain bleed ([place])   . Cancer [facility])    prostate  . 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 Depression   . Diabetes mellitus without complication ([place])   . High cholesterol   . Hypertension   . Stroke [facility])     Past Surgical History:  Procedure Laterality Date  . arm & hand surgery Left 1972   skill saw accident  . COLON SURGERY     partial  . EYE SURGERY     lens implant and cateract- Left  . PROSTATECTOMY      There were no vitals filed for this visit.   Subjective Assessment - 11/06/19 1155    Subjective Patient and wife reports that patient had a fall coming out of the bathroom, was walking without walker. Patient had scrape on R hand and R elbow.    Patient is accompained by: Family member   Wife   Pertinent History history of CVA, HTN, hypercholesterolemia, diabetes, depression, bursitis in R hip, and intracranial hemorrhage.    Limitations Walking;Standing    How long can you walk comfortably? 10-15 minutes    Patient Stated Goals Patient wants to  walk straight, get back to doing what was before    Currently in Pain? No/denies                             [facility] Adult PT Treatment/Exercise - 11/06/19 0001      Transfers   Transfers Sit to Stand;Stand to Sit    Sit to Stand 4: Min guard    Five time sit to stand comments  completed in 29.84 secs w/ UE support from standard height chair    Stand to Sit 5: Supervision    Comments completed sit <> stand training x 10 reps with BUE support, PT providing CGA throughout with completion. verbal cues for controlled descent      Ambulation/Gait   Ambulation/Gait Yes    Ambulation/Gait Assistance 4: Min guard    Ambulation/Gait Assistance Details ambulating x 275 ft with RW. PT providing max verbal cues for concentration/attention to task/ambulation as easily distracted in therapy gym. Dragging of RLE continued throughout gait, patient demo improvements with verbal/tactile cues but limited carryover noted with other gait distances in clinic.     Ambulation Distance (Feet) 275 Feet    Assistive device Rolling  walker    Gait Pattern Step-through pattern;Decreased arm swing - right;Decreased arm swing - left;Decreased step length - right;Decreased stance time - right;Decreased dorsiflexion - right;Decreased hip/knee flexion - right;Poor foot clearance - right    Ambulation Surface Level;Indoor      Standardized Balance Assessment   Standardized Balance Assessment Timed Up and Go Test      Timed Up and Go Test   TUG Normal TUG    Normal TUG (seconds) 25.63   w/ RW     Self-Care   Self-Care Other Self-Care Comments    Other Self-Care Comments  Due to wife reports of increased falls and report due to ambulating without RW. PT providing extensive education on using RW at [place] to promote improved balance and reduced risk for fall and future injuries. Wife also reporting that patient is not doing HEP consitently and all exercises provided. PT educating patient on importance of  completion to further promote improvement in symptoms and gain achieved with PT services. Due to inconsitent freq of completion of HEP. PT provided patient and wife with a exercise tracker, wife is to check off exercises when patient completes and bring into therapy session to ensure compliance with HEP at home.       Exercises   Exercises Other Exercises    Other Exercises  Completed review of current HEP to promote compliance.             Access Code: [email or web address] URL: [email or web address] Date: 11/06/2019 Prepared by: Baldomero Lamy  Exercises Sit to Stand with Armchair - 2 x daily - 5 x weekly - 2 sets - 5 reps Seated March with Resistance - 2 x daily - 5 x weekly - 2 sets - 10 reps Seated Hamstring Curl with Anchored Resistance - 2 x daily - 5 x weekly - 2 sets - 10 reps Seated Heel Toe Raises - 2 x daily - 5 x weekly - 2 sets - 10 reps        PT Education - 11/06/19 1332    Education Details use of RW at ALL TIMES to reduce falls; progress toward goals; HEP exercise tracker to promote compliance    Person(s) Educated Patient;Spouse    Methods Explanation;Demonstration;Handout    Comprehension Returned demonstration;Verbalized understanding;Need further instruction            PT Short Term Goals - 11/06/19 1200      PT SHORT TERM GOAL #1   Title Patient will be independent with HEP for strengthening/balance with wife superivison (ALL STGs Due: 11/06/19)    Baseline wife reports only doing seated exercies, every other day    Time 4    Period Weeks    Status Not Met    Target Date 11/06/19      PT SHORT TERM GOAL #2   Title Pt will improve 5x sit<>stand to less than or equal to 25 seconds for improved transfer efficiency and safety.    Baseline 31.34 secs, 29.84 seconds w/ UE support    Time 4    Period Weeks    Status Not Met      PT SHORT TERM GOAL #3   Title Pt will improve TUG score to less than or equal to 20 seconds for decreased fall risk.     Baseline 31.57 secs w/ RW, 25.63 secs w/ RW    Time 4    Period Weeks    Status Not Met      PT SHORT TERM GOAL #  4   Title Berg Balance to be Assessed and LTG to be set as appropriate    Baseline 36/56 on 10/16/19    Time 4    Period Weeks    Status Achieved      PT SHORT TERM GOAL #5   Title Patient will demonstrate ability to ambulate >300 ft with LRAD and CGA for improved household mobility    Baseline 275 ft w/ CGA, max verbal cues    Time 4    Period Weeks    Status Partially Met             PT Long Term Goals - 10/16/19 1552      PT LONG TERM GOAL #1   Title Patient will be independent with final HEP for improved strength and balance (ALL LTGs Due: 12/04/19)    Baseline no HEP established    Time 8    Period Weeks    Status New      PT LONG TERM GOAL #2   Title Pt will improve gait velocity to at least 2.0 ft/sec for decreased fall risk, improved gait efficiency.    Time 8    Period Weeks    Status New      PT LONG TERM GOAL #3   Title Pt will improve BERG to at least a 41/56 to demo decr fall risk.    Baseline 36/56    Time 8    Period Weeks    Status Revised      PT LONG TERM GOAL #4   Title Patient will improve TUG score to <15 seconds to demosntrate improved mobility and reduced fall risk    Baseline 31.57 secs    Time 8    Period Weeks    Status New      PT LONG TERM GOAL #5   Title Patient will improved 5x sit <> stand to </= 15 seconds to demonstrate improved functional mobility    Baseline 31.34 secs    Time 8    Period Weeks    Status New      PT LONG TERM GOAL #6   Title Patient and wife will report understanding of fall prevention within the home    Baseline currently dependent    Time 8    Period Weeks    Status New                 Plan - 11/06/19 1335    Clinical Impression Statement Today's skilled PT session included assesment of patient's progress toward STG. Patient unable to meet STG for TUG/ 5x sit <> stand test  during session but did demonstrate progress toward these goals. Patient able to partially meet ambulation goal, demo ability to ambulate with CGA for 275' today. PT provided extensive education on use of RW to reduce fall risk as patient experienced a fall since last session. Also provided exercise tracker to promote HEP compliance. Will continue to progress toward LTG's.    Personal Factors and Comorbidities Behavior Pattern;Comorbidity 3+    Comorbidities history of CVA, HTN, hypercholesterolemia, diabetes, depression, bursitis in R hip, and intracranial hemorrhage.    Examination-Activity Limitations Bed Mobility;Sit;Transfers;Stand;Toileting;Bathing;Dressing    Examination-Participation Restrictions [employer] Activity    Stability/Clinical Decision Making Evolving/Moderate complexity    Rehab Potential Fair    PT Frequency 2x / week    PT Duration 8 weeks    PT Treatment/Interventions ADLs/Self Care Home Management;Cryotherapy;Electrical Stimulation;Moist Heat;DME Instruction;Gait training;Functional mobility Scientist, forensic;Therapeutic activities;Therapeutic  exercise;Neuromuscular re-education;Balance training;Patient/family education;Orthotic Fit/Training;Manual techniques;Passive range of motion    PT Next Visit Plan add to HEP as appropriate, seated/standing balance and strengthening, sit <> stand training, gait (maybe try colored bands for target). Complete session in therapy room if able for improved participation.    Consulted and Agree with Plan of Care Patient;Family member/caregiver    Family Member Consulted Wife           Patient will benefit from skilled therapeutic intervention in order to improve the following deficits and impairments:  Abnormal gait, Decreased endurance, Decreased balance, Decreased mobility, Difficulty walking, Hypomobility, Postural dysfunction, Pain, Decreased strength, Decreased safety awareness, Decreased knowledge of use of DME, Decreased  coordination, Decreased activity tolerance, Decreased cognition, Decreased range of motion  Visit Diagnosis: Other abnormalities of gait and mobility  Unsteadiness on feet  Muscle weakness (generalized)     Problem List Patient Active Problem List   Diagnosis Date Noted  . Labile blood glucose   . 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 OSA (obstructive sleep apnea)   . Controlled type 2 diabetes mellitus with hyperglycemia, without long-term current use of insulin ([place])   . CVA (cerebral vascular accident) ([place]) 07/16/2019  . Dyslipidemia   . Benign essential HTN   . Diabetes mellitus type 2 in obese ([place])   . Major depressive disorder, recurrent episode, moderate ([place])   . Gait disorder   . Acute right MCA stroke ([place]) 06/28/2019  . Ambulatory dysfunction   . Stroke ([place]) 06/24/2019  . Type 2 diabetes mellitus ([place]) 10/04/2018  . Depression 10/04/2018  . Acute CVA (cerebrovascular accident) ([place]) 09/18/2017  . Stroke (cerebrum) ([place]) 09/17/2017  . Diabetes mellitus without complication ([place]) 79/64/1893  . Closed head injury 04/22/2013  . HLD (hyperlipidemia) 07/29/2009  . Essential hypertension 07/29/2009  . EFFUSION, PLEURAL 07/29/2009  . DIVERTICULITIS, COLON 07/29/2009  . 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 COLONIC POLYPS, HYPERPLASTIC, HX OF 07/29/2009    Jones Bales, PT, DPT 11/06/2019, 1:38 PM  [facility] [address] [place], [place], [address] Phone: [phone]   Fax:  [phone]  Name: JUANDANIEL MANFREDO MRN: [id number] Date of Birth: Jan 30, 1941

## 2019-11-06 NOTE — Therapy (Signed)
[facility] [address] [place], [place], [address] Phone: [phone]   Fax:  [phone]  Occupational Therapy Evaluation  Patient Details  Name: Justin [name] MRN: [id number] Date of Birth: 01/16/1941 No data recorded  Encounter Date: 11/06/2019   OT End of Session - 11/06/19 1928    Visit Number 1    Number of Visits 13    Authorization Type [employer]    OT Start Time 1230    OT Stop Time 1315    OT Time Calculation (min) 45 min    Activity Tolerance Other (comment)    Behavior During Therapy Impulsive           Past Medical History:  Diagnosis Date  . Brain bleed ([place])   . Cancer [facility])    prostate  . 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 Depression   . Diabetes mellitus without complication ([place])   . High cholesterol   . Hypertension   . Stroke [facility])     Past Surgical History:  Procedure Laterality Date  . arm & hand surgery Left 1972   skill saw accident  . COLON SURGERY     partial  . EYE SURGERY     lens implant and cateract- Left  . PROSTATECTOMY      There were no vitals filed for this visit.   Subjective Assessment - 11/06/19 1234    Subjective  "sex"  when asked what he wished he could do better    Patient is accompanied by: Family member   [name]   Pertinent History per wife patient has had 5 strokes, last two in May 2021    Limitations Cognition    Currently in Pain? 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[employer]; quit due to stroke in 2019    Leisure outdoor activities, hunting and fishing      ADL    Eating/Feeding Independent    Grooming Set up    [place] independent    [place] independent    Environmental education officer Independent    Lower Body Dressing Minimal assistance    Toilet Transfer Modified independent    [place] independent    Toileting -  Advice worker with back;Grab bars   hand held [place] walker    ADL comments Lacks initiation to start tasks, but can complete familiar functional tasks      IADL   Shopping Completely unable to shop    [employer] Does not participate in any housekeeping tasks    Prior Level of Function Meal Prep Independent    Meal Prep Needs to have meals prepared and served    [employer] on family or friends for transportation    Prior Level of Function  Medication Managment independent    Medication Management Is not capable of dispensing or managing own medication      Written Expression   Dominant Hand Right    Handwriting Mild micrographia      Vision - History   Baseline Vision Wears glasses only for reading    Visual History --   lens implant left eye   Patient Visual Report --   Reports left eye blurry vision     Vision Assessment   Eye Alignment Within Functional Limits    Ocular Range of Motion Within Functional Limits    Alignment/Gaze Preference Within Defined Limits    Tracking/Visual Pursuits Able to track stimulus in all quads without difficulty    Saccades Within functional limits      Activity Tolerance   Activity Tolerance Comments Wife reports he watches lots of TV seated in recliner      Cognition   Overall Cognitive Status Impaired/Different from baseline    Area of Impairment Orientation;Attention;Memory;Following commands;Safety/judgement;Awareness;Problem solving    Current Attention Level  Focused    Attention Comments Impulsivity negatively impacts pt's accuracy      Posture/Postural Control   Posture/Postural Control Postural limitations    Postural Limitations Rounded Shoulders;Posterior pelvic tilt      Sensation   Light Touch Appears Intact    Stereognosis Appears Intact      Coordination   Finger Nose Finger Test undershooting, difficulty following instructions    9 Hole Peg Test Right;Left    Right 9 Hole Peg Test 47.28    Left 9 Hole Peg Test 50.78      Praxis   Praxis Intact      ROM / Strength   AROM / PROM / Strength AROM;Strength      AROM   Overall AROM  Within functional limits for tasks performed    Overall AROM Comments BUE      Strength   Overall Strength Within functional limits for tasks performed    Overall Strength Comments BUE      Hand Function   Right Hand Gross Grasp Functional    Right Hand Grip (lbs) 65.4    Right Hand Lateral Pinch 13 lbs    Left Hand Gross Grasp Impaired    Left Hand Grip (lbs) 54.8    Left Hand Lateral Pinch 11 lbs                           OT Education - 11/06/19 1927    Education Details Reviewed results of eval and potential goals    Person(s) Educated Patient;Spouse    Methods Explanation    Comprehension Need further instruction            OT Short Term Goals - 11/06/19 1935      OT SHORT TERM GOAL #1   Title Patient and wife will compile a list of activities patient can safely participate in at home - e.g. making sandwich, picking out clothing, etc. due 12/21/19    Time 4    Period Weeks    Status New    Target Date 12/21/19      OT SHORT TERM GOAL #2   Title Patient will demonstrate ability to attend for 5 min or more to a familiar activity  in minimally distracting environment    Time 4    Period Weeks    Status New      OT SHORT TERM GOAL #3  Title Patient will complete a home exercise program to address upper body active movement and general strength with min  cueing and set up    Time 4    Period Weeks    Status New             OT Long Term Goals - 11/06/19 1940      OT LONG TERM GOAL #1   Title Patient will complete a home activities rogram designed to help him safely engage in IADL with minimal prompting    Time 6    Period Weeks    Status New    Target Date 01/05/20      OT LONG TERM GOAL #2   Title Patient will demonstrate sufficient standing balance to stand at counter top to complete a familiar functional task - eg prepare a snack    Time 6    Period Weeks    Status New      OT LONG TERM GOAL #3   Title Patient will follow a daily schedule to outline sleep and wake times, meal times, and activity times to reduce amount of idol TV watching time during day.    Time 6    Period Weeks    Status New                 Plan - 11/06/19 1929    Clinical Impression Statement Patient is a 79 year old man with history of multiple strokes, most recently with two in May 2021.  Patient with prior left hemiparesis, now with right hemiparesis, decreased activity tolerance, decreased balance,  and significant cognitive impairment - decreased focused attention, impulsivity, decreased awareness, initiaition, and memory.    OT Occupational Profile and History Detailed Assessment- Review of Records and additional review of physical, cognitive, psychosocial history related to current functional performance    Occupational performance deficits (Please refer to evaluation for details): ADL's;IADL's    Body Structure / Function / Physical Skills ADL;Coordination;Endurance;GMC;Balance;Decreased knowledge of precautions;Sensation;IADL;Flexibility;Decreased knowledge of use of DME;Body mechanics;Cardiopulmonary status limiting activity;Dexterity;FMC;Strength    Cognitive Skills Attention;Energy/Drive;Memory;Safety Awareness;Problem Solve;Perception;Orientation;Sequencing    Rehab Potential Fair    Clinical Decision Making Limited treatment options,  no task modification necessary    Comorbidities Affecting Occupational Performance: May have comorbidities impacting occupational performance    Modification or Assistance to Complete Evaluation  Min-Moderate modification of tasks or assist with assess necessary to complete eval    OT Frequency 2x / week    OT Duration 6 weeks    OT Treatment/Interventions Self-care/ADL training;Therapeutic exercise;Neuromuscular education;Patient/family education;Balance training;Therapeutic activities;Functional Mobility Training;Cognitive remediation/compensation;DME and/or AE instruction    Plan Need to work on focused attention - table top exercise in min distracting environment - e.g. sorting cards, making simple sandwich    Consulted and Agree with Plan of Care Patient;Family member/caregiver    Family Member Consulted [name]           Patient will benefit from skilled therapeutic intervention in order to improve the following deficits and impairments:   Body Structure / Function / Physical Skills: ADL, Coordination, Endurance, GMC, Balance, Decreased knowledge of precautions, Sensation, IADL, Flexibility, Decreased knowledge of use of DME, Body mechanics, Cardiopulmonary status limiting activity, Dexterity, FMC, Strength Cognitive Skills: Attention, Energy/Drive, Memory, Safety Awareness, Problem Solve, Perception, Orientation, Sequencing     Visit Diagnosis: Unsteadiness on feet - Plan: Ot plan of care cert/re-cert  Muscle weakness (generalized) - Plan: Ot plan of care cert/re-cert  Attention and concentration deficit -  Plan: Ot plan of care cert/re-cert    Problem List Patient Active Problem List   Diagnosis Date Noted  . Labile blood glucose   . 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 OSA (obstructive sleep apnea)   . Controlled type 2 diabetes mellitus with hyperglycemia, without long-term current use of insulin ([place])   . CVA (cerebral vascular accident) ([place])  07/16/2019  . Dyslipidemia   . Benign essential HTN   . Diabetes mellitus type 2 in obese ([place])   . Major depressive disorder, recurrent episode, moderate ([place])   . Gait disorder   . Acute right MCA stroke ([place]) 06/28/2019  . Ambulatory dysfunction   . Stroke ([place]) 06/24/2019  . Type 2 diabetes mellitus ([place]) 10/04/2018  . Depression 10/04/2018  . Acute CVA (cerebrovascular accident) ([place]) 09/18/2017  . Stroke (cerebrum) ([place]) 09/17/2017  . Diabetes mellitus without complication ([place]) 42/87/6811  . Closed head injury 04/22/2013  . HLD (hyperlipidemia) 07/29/2009  . Essential hypertension 07/29/2009  . EFFUSION, PLEURAL 07/29/2009  . DIVERTICULITIS, COLON 07/29/2009  . 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 COLONIC POLYPS, HYPERPLASTIC, HX OF 07/29/2009    Mariah Milling, OTR/L 11/06/2019, 7:45 PM  [facility] [address] [place], [place], [address] Phone: [phone]   Fax:  [phone]  Name: Justin [name] MRN: [id number] Date of Birth: 08-Mar-1940

## 2019-11-07 NOTE — Therapy (Signed)
[facility] [address] [place], [place], [address] Phone: [phone]   Fax:  [phone]  Speech Language Pathology Evaluation  Patient Details  Name: Justin [name] MRN: [id number] Date of Birth: 11-21-1940 Referring Provider (SLP): Dr. Leonie Man   Encounter Date: 11/06/2019   End of Session - 11/06/19 1103   Visit Number 1    Number of Visits 9    Date for SLP Re-Evaluation 01/06/20    SLP Start Time 6433    SLP Stop Time  1145    SLP Time Calculation (min) 43 min    Activity Tolerance Patient tolerated treatment well           Past Medical History:  Diagnosis Date  . Brain bleed ([place])   . Cancer [facility])    prostate  . 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 Depression   . Diabetes mellitus without complication ([place])   . High cholesterol   . Hypertension   . Stroke [facility])     Past Surgical History:  Procedure Laterality Date  . arm & hand surgery Left 1972   skill saw accident  . COLON SURGERY     partial  . EYE SURGERY     lens implant and cateract- Left  . PROSTATECTOMY      There were no vitals filed for this visit.   Subjective Assessment - 11/06/19 1109    Subjective "Maybe 60%" re: memory changes    Patient is accompained by: Family member   Judy             SLP Evaluation [facility] - 11/06/19 1103      SLP Visit Information   SLP Received On 11/06/19    Referring Provider (SLP) Dr. Leonie Man    Onset Date 2 new strokes May 2021    Medical Diagnosis CVA      Subjective   Subjective "he just watches tv"    Patient/Family Stated Goal be able to have a conversation, follow commands      Pain Assessment   Currently in Pain? No/denies      General Information   HPI [employer]. Gonzales is a 79 year old male with history of HTN, T2DM, C HI-04/2013, lumbar radiculopathy with gait disorder/falls, HTN, multiple prior CVA; he is known to our SLP services after CVA in 2019, when he received  treatment for mild-moderate cognitive communication deficits. Ad[facility] to [facility] 06/24/2019 with multiple falls in the week prior to admission, speech difficulty and weakness.  MRI brain showed 2 small acute infarcts in right MCA territory and advanced chronic ischemic changes with widespread atrophy advanced for age.    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     Cognition   Overall Cognitive Status Impaired/Different from baseline    Area of Impairment Orientation;Attention;Memory;Following commands;Safety/judgement;Awareness;Problem solving    Orientation Level Time;Situation    Current Attention Level Focused    Attention Comments impulsive, decreased awareness    Memory Decreased short-term  memory    Following Commands Follows one step commands inconsistently    Following Command Comments per wife, in a functional context    Safety/Judgement Decreased awareness of safety;Decreased awareness of deficits    Awareness Intellectual;Emergent;Anticipatory    Problem Solving Slow processing;Requires verbal cues   lower level impairments (attention, recall) impact     Auditory Comprehension   Overall Auditory Comprehension Other (comment)   comprehension is affected by attention, processing   Commands Impaired    One Step Basic Commands 50-74% accurate    Complex Commands 0-24% accurate    Conversation Simple    Interfering Components Attention;Processing speed      Visual Recognition/Discrimination   Discrimination Not tested      Reading Comprehension    Reading Status Not tested      Expression   Primary Mode of Expression Verbal      Verbal Expression   Overall Verbal Expression Impaired    Level of Generative/Spontaneous Verbalization Sentence   wife reports had conversations before     Written Expression   Dominant Hand Right    Written Expression Not tested      Oral Motor/Sensory Function   Overall Oral Motor/Sensory Function Other (comment)   not formally assessed today; will assess further PRN     Motor Speech   Overall Motor Speech Appears within functional limits for tasks assessed      Standardized Assessments   Standardized Assessments  Cognitive Linguistic Quick Test   to be completed next 1-2 sessions                     SLP Long Term Goals - 11/06/19 1103      SLP LONG TERM GOAL #1   Title Pt will sustain attention to simple cognitive-linguistic tasks 5 minutes x 3 per session, over 2 visits.    Time 4    Period Weeks    Status New      SLP LONG TERM GOAL #2   Title Pt will access/retrieve schedule or appointment information from memory system with min cues from spouse, x 3 sessions.    Time 4    Period Weeks    Status New      SLP LONG TERM GOAL #3   Title Pt will participate in 5 minute simple conversation with supports/compensations x 3 visits.    Time 4    Period Weeks    Status New            Plan - 11/06/19 1103   Clinical Impression Statement Mr. Ducre presents with likely moderate-severe cognitive communication impairments s/p multiple CVAs, a decline from mild-moderate deficits when last seen at our clinic in 2019. Cognitive-linguistic testing initiated (CLQT) and to be completed next 1-2 sessions. Attention and processing appear to impact pt most significantly in functional context. He has difficulty attending to and following 1-step commands. SLP on CIR noted pt followed commands better when hearing amplification provided; pt does not wear hearing aids and does not appear to  have had a hearing evaluation, but may benefit from this. Wife reports prior to most recent CVAs pt able to engage in conversation, but does not do so now. Pt could not follow simple conversation and when SLP asked direct, simple questions he required extended time and responded about 60% of the time. I recommend trial course of ST for 4 weeks to determine if pt can make gains or benefit from pt/family compensation training. Severity of attention  deficits may hinder progress; MD may consider addressing this medically.    Speech Therapy Frequency 2x / week    Duration 4 weeks   or 9 total visits   Treatment/Interventions SLP instruction and feedback;Functional tasks;Cognitive reorganization;Compensatory strategies;Patient/family education;Multimodal communcation approach;Internal/external aids    Potential to Achieve Goals Fair    Potential Considerations Ability to learn/carryover information;Cooperation/participation level;Medical prognosis;Severity of impairments;Previous level of function    Consulted and Agree with Plan of Care Patient;Family member/caregiver    Family Member Consulted spouse           Patient will benefit from skilled therapeutic intervention in order to improve the following deficits and impairments:   Cognitive communication deficit    Problem List Patient Active Problem List   Diagnosis Date Noted  . Labile blood glucose   . 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 OSA (obstructive sleep apnea)   . Controlled type 2 diabetes mellitus with hyperglycemia, without long-term current use of insulin ([place])   . CVA (cerebral vascular accident) ([place]) 07/16/2019  . Dyslipidemia   . Benign essential HTN   . Diabetes mellitus type 2 in obese ([place])   . Major depressive disorder, recurrent episode, moderate ([place])   . Gait disorder   . Acute right MCA stroke ([place]) 06/28/2019  . Ambulatory dysfunction   . Stroke ([place]) 06/24/2019  . Type 2 diabetes mellitus  ([place]) 10/04/2018  . Depression 10/04/2018  . Acute CVA (cerebrovascular accident) ([place]) 09/18/2017  . Stroke (cerebrum) ([place]) 09/17/2017  . Diabetes mellitus without complication ([place]) 01/60/1093  . Closed head injury 04/22/2013  . HLD (hyperlipidemia) 07/29/2009  . Essential hypertension 07/29/2009  . EFFUSION, PLEURAL 07/29/2009  . DIVERTICULITIS, COLON 07/29/2009  . 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 COLONIC POLYPS, HYPERPLASTIC, HX OF 07/29/2009   Deneise Lever, [place], CCC-SLP Speech-Language Pathologist   Aliene Altes 11/07/2019, 8:36 AM  [facility] [address] [place], [place], [address] Phone: [phone]   Fax:  [phone]  Name: Justin [name] MRN: [id number] Date of Birth: April 28, 1940

## 2019-11-08 ENCOUNTER — Other Ambulatory Visit: Payer: Self-pay

## 2019-11-08 ENCOUNTER — Ambulatory Visit: Payer: Medicare HMO

## 2019-11-08 DIAGNOSIS — M6281 Muscle weakness (generalized): Secondary | ICD-10-CM

## 2019-11-08 DIAGNOSIS — R2689 Other abnormalities of gait and mobility: Secondary | ICD-10-CM

## 2019-11-08 DIAGNOSIS — R2681 Unsteadiness on feet: Secondary | ICD-10-CM

## 2019-11-08 NOTE — Therapy (Signed)
[facility] [address] [place], [place], [address] Phone: [phone]   Fax:  [phone]  Physical Therapy Treatment  Patient Details  Name: Justin [name] MRN: [id number] Date of Birth: 1940-05-23 Referring Provider (PT): Antony Contras, MD   Encounter Date: 11/08/2019   PT End of Session - 11/08/19 1540    Visit Number 6    Number of Visits 17    Date for PT Re-Evaluation 01/07/20   POC for 8 weeks, Cert for 90 days   Authorization Type [employer] (Auth Required)    Progress Note Due on Visit 10    PT Start Time 1537   pt arriving late   PT Stop Time 1617    PT Time Calculation (min) 40 min    Equipment Utilized During Treatment Gait belt    Activity Tolerance Patient tolerated treatment well    Behavior During Therapy [facility] for tasks assessed/performed           Past Medical History:  Diagnosis Date  . Brain bleed ([place])   . Cancer [facility])    prostate  . 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 Depression   . Diabetes mellitus without complication ([place])   . High cholesterol   . Hypertension   . Stroke [facility])     Past Surgical History:  Procedure Laterality Date  . arm & hand surgery Left 1972   skill saw accident  . COLON SURGERY     partial  . EYE SURGERY     lens implant and cateract- Left  . PROSTATECTOMY      There were no vitals filed for this visit.   Subjective Assessment - 11/08/19 1538    Subjective Patient reports no new changes since last visit. Reports no falls. Patient showing therapist his tracking for exercises, but no checks were on the sheet but reports he completed them. No pain.    Patient is accompained by: Family member   Wife   Pertinent History history of CVA, HTN, hypercholesterolemia, diabetes, depression, bursitis in R hip, and intracranial hemorrhage.    Limitations Walking;Standing    How long can you walk comfortably? 10-15 minutes    Patient Stated Goals  Patient wants to walk straight, get back to doing what was before    Currently in Pain? No/denies                             [facility] Adult PT Treatment/Exercise - 11/08/19 0001      Transfers   Transfers Sit to Stand;Stand to Sit    Sit to Stand 4: Min guard    Stand to Sit 5: Supervision    Comments verbal cues for hand placement required initially as patient placed BUE on RW. completed sit <> stand training, 2 x 10 reps. Increased fatigue noted in BLE with increased repetition. verbal cues for improved forward lean and control with descent      Ambulation/Gait   Ambulation/Gait Yes    Ambulation/Gait Assistance 4: Min guard    Ambulation/Gait Assistance Details ambulating into/out of therapy session with RW. PT providing verbal cues for improved step length and keeping RLE in walker. PT ambulating with patient to car today, increased dififculty with step length and bringing RLE through with increased distraction requiring max verbal cues from PT.     Ambulation Distance (Feet) 150 Feet    Assistive device Rolling walker  Gait Pattern Step-through pattern;Decreased arm swing - right;Decreased arm swing - left;Decreased step length - right;Decreased stance time - right;Decreased dorsiflexion - right;Decreased hip/knee flexion - right;Poor foot clearance - right    Ambulation Surface Level;Indoor;Outdoor;Paved      Neuro Re-ed    Neuro Re-ed Details  Standing without UE support: completed alteranting toe taps to 4" step, completed x 10 reps bilaterally. PT providing verbal cues for increased hip/knee flexion on RLE to avoid dragging foot off of step.  Completed standing on airex pad without UE support, 2 x 1 minute each, increased sway noted, and verbal cues required for upright posture. Progressed to completing eyes closed with wide BOS on airex, 4 x 20-25 secs, increased sway and pt demo hesistancy to keeping eyes closed as feels like he was going to fall. With light UE  support on RW, completed alternating marching in standing without resistance, increased dififculty noted with RLE upon standing, with patient reporting that it "feels weak". CGA throughout with completion of all of the following exercises.       Exercises   Exercises Other Exercises    Other Exercises  completed resisted alteranting marching with red theraband on BLE, increased difficulty with RLE, PT Providing visual cue to lift until touches PT hand for improved completion, completed x 10 reps bilaterally. completed resisted hip abduction red theraband in seated positoin, x 10 reps, verbal cues for improved ROM with RLE, providing tactile cues as needed. In seated position, with PT stabilizing resistance via red theraband, completed x 10 reps of resisted DF. PT providing verbal cues for proper technique/form.                     PT Short Term Goals - 11/06/19 1200      PT SHORT TERM GOAL #1   Title Patient will be independent with HEP for strengthening/balance with wife superivison (ALL STGs Due: 11/06/19)    Baseline wife reports only doing seated exercies, every other day    Time 4    Period Weeks    Status Not Met    Target Date 11/06/19      PT SHORT TERM GOAL #2   Title Pt will improve 5x sit<>stand to less than or equal to 25 seconds for improved transfer efficiency and safety.    Baseline 31.34 secs, 29.84 seconds w/ UE support    Time 4    Period Weeks    Status Not Met      PT SHORT TERM GOAL #3   Title Pt will improve TUG score to less than or equal to 20 seconds for decreased fall risk.    Baseline 31.57 secs w/ RW, 25.63 secs w/ RW    Time 4    Period Weeks    Status Not Met      PT SHORT TERM GOAL #4   Title Berg Balance to be Assessed and LTG to be set as appropriate    Baseline 36/56 on 10/16/19    Time 4    Period Weeks    Status Achieved      PT SHORT TERM GOAL #5   Title Patient will demonstrate ability to ambulate >300 ft with LRAD and CGA for  improved household mobility    Baseline 275 ft w/ CGA, max verbal cues    Time 4    Period Weeks    Status Partially Met             PT Long  Term Goals - 10/16/19 1552      PT LONG TERM GOAL #1   Title Patient will be independent with final HEP for improved strength and balance (ALL LTGs Due: 12/04/19)    Baseline no HEP established    Time 8    Period Weeks    Status New      PT LONG TERM GOAL #2   Title Pt will improve gait velocity to at least 2.0 ft/sec for decreased fall risk, improved gait efficiency.    Time 8    Period Weeks    Status New      PT LONG TERM GOAL #3   Title Pt will improve BERG to at least a 41/56 to demo decr fall risk.    Baseline 36/56    Time 8    Period Weeks    Status Revised      PT LONG TERM GOAL #4   Title Patient will improve TUG score to <15 seconds to demosntrate improved mobility and reduced fall risk    Baseline 31.57 secs    Time 8    Period Weeks    Status New      PT LONG TERM GOAL #5   Title Patient will improved 5x sit <> stand to </= 15 seconds to demonstrate improved functional mobility    Baseline 31.34 secs    Time 8    Period Weeks    Status New      PT LONG TERM GOAL #6   Title Patient and wife will report understanding of fall prevention within the home    Baseline currently dependent    Time 8    Period Weeks    Status New                 Plan - 11/08/19 1622    Clinical Impression Statement Today's skilled PT session included continued activites to promote RLE strengthening and progress balance activites without UE support. Patient contiues to require max verbal cues throughout session, especially during ambulation today with increased distraction. Continue to treat in private therapy gym at this time to promote improved particiaption and reduced distractions during sesison. Will continue to progress toward goals.    Personal Factors and Comorbidities Behavior Pattern;Comorbidity 3+    Comorbidities  history of CVA, HTN, hypercholesterolemia, diabetes, depression, bursitis in R hip, and intracranial hemorrhage.    Examination-Activity Limitations Bed Mobility;Sit;Transfers;Stand;Toileting;Bathing;Dressing    Examination-Participation Restrictions [employer] Activity    Stability/Clinical Decision Making Evolving/Moderate complexity    Rehab Potential Fair    PT Frequency 2x / week    PT Duration 8 weeks    PT Treatment/Interventions ADLs/Self Care Home Management;Cryotherapy;Electrical Stimulation;Moist Heat;DME Instruction;Gait training;Functional mobility Scientist, forensic;Therapeutic activities;Therapeutic exercise;Neuromuscular re-education;Balance training;Patient/family education;Orthotic Fit/Training;Manual techniques;Passive range of motion    PT Next Visit Plan add to HEP as appropriate, seated/standing balance and strengthening, sit <> stand training, gait (maybe try colored bands for target). Complete session in therapy room if able for improved participation.    Consulted and Agree with Plan of Care Patient;Family member/caregiver    Family Member Consulted Wife           Patient will benefit from skilled therapeutic intervention in order to improve the following deficits and impairments:  Abnormal gait, Decreased endurance, Decreased balance, Decreased mobility, Difficulty walking, Hypomobility, Postural dysfunction, Pain, Decreased strength, Decreased safety awareness, Decreased knowledge of use of DME, Decreased coordination, Decreased activity tolerance, Decreased cognition, Decreased range of motion  Visit Diagnosis:  Unsteadiness on feet  Muscle weakness (generalized)  Other abnormalities of gait and mobility     Problem List Patient Active Problem List   Diagnosis Date Noted  . Labile blood glucose   . 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 OSA (obstructive sleep apnea)   . Controlled type 2 diabetes mellitus with hyperglycemia,  without long-term current use of insulin ([place])   . CVA (cerebral vascular accident) ([place]) 07/16/2019  . Dyslipidemia   . Benign essential HTN   . Diabetes mellitus type 2 in obese ([place])   . Major depressive disorder, recurrent episode, moderate ([place])   . Gait disorder   . Acute right MCA stroke ([place]) 06/28/2019  . Ambulatory dysfunction   . Stroke ([place]) 06/24/2019  . Type 2 diabetes mellitus ([place]) 10/04/2018  . Depression 10/04/2018  . Acute CVA (cerebrovascular accident) ([place]) 09/18/2017  . Stroke (cerebrum) ([place]) 09/17/2017  . Diabetes mellitus without complication ([place]) 93/26/7124  . Closed head injury 04/22/2013  . HLD (hyperlipidemia) 07/29/2009  . Essential hypertension 07/29/2009  . EFFUSION, PLEURAL 07/29/2009  . DIVERTICULITIS, COLON 07/29/2009  . 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 COLONIC POLYPS, HYPERPLASTIC, HX OF 07/29/2009    Jones Bales, PT, DPT 11/08/2019, 4:24 PM  [facility] [address] [place], [place], [address] Phone: [phone]   Fax:  [phone]  Name: Justin [name] MRN: [id number] Date of Birth: Mar 07, 1940

## 2019-11-13 ENCOUNTER — Ambulatory Visit: Payer: Medicare HMO | Admitting: Speech Pathology

## 2019-11-13 ENCOUNTER — Ambulatory Visit: Payer: Medicare HMO | Admitting: Occupational Therapy

## 2019-11-13 ENCOUNTER — Ambulatory Visit: Payer: Medicare HMO

## 2019-11-15 ENCOUNTER — Ambulatory Visit: Payer: Medicare HMO

## 2019-11-15 ENCOUNTER — Ambulatory Visit: Payer: Medicare HMO | Admitting: Speech Pathology

## 2019-11-15 ENCOUNTER — Ambulatory Visit: Payer: Medicare HMO | Admitting: Occupational Therapy

## 2019-11-15 ENCOUNTER — Other Ambulatory Visit: Payer: Self-pay

## 2019-11-15 DIAGNOSIS — R2689 Other abnormalities of gait and mobility: Secondary | ICD-10-CM | POA: Diagnosis not present

## 2019-11-15 DIAGNOSIS — R41841 Cognitive communication deficit: Secondary | ICD-10-CM

## 2019-11-15 NOTE — Therapy (Signed)
[facility] [address] [place], [place], [address] Phone: [phone]   Fax:  [phone]  Speech Language Pathology Treatment  Patient Details  Name: Justin [name] MRN: [id number] Date of Birth: May 02, 1940 Referring Provider (SLP): Dr. Leonie Man   Encounter Date: 11/15/2019   End of Session - 11/15/19 1319    Visit Number 2    Number of Visits 9    Date for SLP Re-Evaluation 01/06/20    SLP Start Time 1146    SLP Stop Time  1229    SLP Time Calculation (min) 43 min    Activity Tolerance Patient tolerated treatment well           Past Medical History:  Diagnosis Date  . Brain bleed ([place])   . Cancer [facility])    prostate  . 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 Depression   . Diabetes mellitus without complication ([place])   . High cholesterol   . Hypertension   . Stroke [facility])     Past Surgical History:  Procedure Laterality Date  . arm & hand surgery Left 1972   skill saw accident  . COLON SURGERY     partial  . EYE SURGERY     lens implant and cateract- Left  . PROSTATECTOMY      There were no vitals filed for this visit.   Subjective Assessment - 11/15/19 1150    Subjective "She's in the car."    Currently in Pain? No/denies                 ADULT SLP TREATMENT - 11/15/19 1309      General Information   Behavior/Cognition Alert;Distractible;Requires cueing      Treatment Provided   Treatment provided Cognitive-Linquistic      Pain Assessment   Pain Assessment No/denies pain      Cognitive-Linquistic Treatment   Treatment focused on Cognition    Skilled Treatment Pt was alone for session; wife waited in the car. SLP targeted sustained attention to simple cognitive-linguistic tasks; pt required occasional mod cues over 5 minute period for simple categorization (50% accuracy). Impulsivity noted. For simple immediate recall, accuracy was 100% for image from F:6, 90% for 2 images  from F:12. SLP sent note home with pt recommending pt's wife attend with him at least every other session, and requesting he bring 3 ring binder next session.       Assessment / Recommendations / Plan   Plan Continue with current plan of care      Progression Toward Goals   Progression toward goals Progressing toward goals                SLP Long Term Goals - 11/15/19 1322      SLP LONG TERM GOAL #1   Title Pt will sustain attention to simple cognitive-linguistic tasks 5 minutes x 3 per session, over 2 visits.    Time 4    Period Weeks    Status On-going      SLP LONG TERM GOAL #2   Title Pt will access/retrieve schedule or appointment information from memory system with min cues from spouse, x 3 sessions.    Time 4    Period Weeks    Status On-going      SLP LONG TERM GOAL #3   Title Pt will participate in 5 minute simple conversation with supports/compensations x 3 visits.    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 Cognitive-linguistic testing initiated (CLQT) and to be completed next 1-2 sessions. Pt was alone for session today; sent note home requesting wife attend at least every other session. Pt unlikely to make progress/achieve carryover without family involvement due to severity of deficits. I recommend trial course of ST for 4 weeks to determine if pt can make gains or benefit from pt/family compensation training. Severity of attention deficits may hinder progress; MD may consider addressing this medically.    Speech Therapy Frequency 2x / week    Duration 4 weeks   or 9 total visits   Treatment/Interventions SLP instruction and feedback;Functional tasks;Cognitive reorganization;Compensatory strategies;Patient/family  education;Multimodal communcation approach;Internal/external aids    Potential to Achieve Goals Fair    Potential Considerations Ability to learn/carryover information;Cooperation/participation level;Medical prognosis;Severity of impairments;Previous level of function    Consulted and Agree with Plan of Care Patient;Family member/caregiver    Family Member Consulted spouse           Patient will benefit from skilled therapeutic intervention in order to improve the following deficits and impairments:   Cognitive communication deficit    Problem List Patient Active Problem List   Diagnosis Date Noted  . Labile blood glucose   . 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 OSA (obstructive sleep apnea)   . Controlled type 2 diabetes mellitus with hyperglycemia, without long-term current use of insulin ([place])   . CVA (cerebral vascular accident) ([place]) 07/16/2019  . Dyslipidemia   . Benign essential HTN   . Diabetes mellitus type 2 in obese ([place])   . Major depressive disorder, recurrent episode, moderate ([place])   . Gait disorder   . Acute right MCA stroke ([place]) 06/28/2019  . Ambulatory dysfunction   . Stroke ([place]) 06/24/2019  . Type 2 diabetes mellitus ([place]) 10/04/2018  . Depression 10/04/2018  . Acute CVA (cerebrovascular accident) ([place]) 09/18/2017  . Stroke (cerebrum) ([place]) 09/17/2017  . Diabetes mellitus without complication ([place]) 34/74/2595  . Closed head injury 04/22/2013  . HLD (hyperlipidemia) 07/29/2009  . Essential hypertension 07/29/2009  . EFFUSION, PLEURAL 07/29/2009  . DIVERTICULITIS, COLON 07/29/2009  . 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 COLONIC POLYPS, HYPERPLASTIC, HX OF 07/29/2009   Deneise Lever, [place], CCC-SLP Speech-Language Pathologist  Aliene Altes 11/15/2019, 1:27 PM  [facility] [address] [place], [place], [address] Phone: [phone]   Fax:  [phone]   Name: Justin [name] MRN: [id number] Date of Birth: Mar 14, 1940

## 2019-11-15 NOTE — Patient Instructions (Signed)
Bring a 3-ring binder with you to therapy next time. It would be helpful for Justin [name] to join you for therapy; if she can't come every time, maybe every other time?  Please complete the homework assigned.

## 2019-11-20 ENCOUNTER — Other Ambulatory Visit: Payer: Self-pay

## 2019-11-20 ENCOUNTER — Encounter: Payer: Self-pay | Admitting: Occupational Therapy

## 2019-11-20 ENCOUNTER — Ambulatory Visit: Payer: Medicare HMO | Admitting: Occupational Therapy

## 2019-11-20 ENCOUNTER — Ambulatory Visit: Payer: Medicare HMO | Admitting: Speech Pathology

## 2019-11-20 ENCOUNTER — Ambulatory Visit: Payer: Medicare HMO

## 2019-11-20 DIAGNOSIS — R2689 Other abnormalities of gait and mobility: Secondary | ICD-10-CM

## 2019-11-20 DIAGNOSIS — M6281 Muscle weakness (generalized): Secondary | ICD-10-CM

## 2019-11-20 DIAGNOSIS — R41841 Cognitive communication deficit: Secondary | ICD-10-CM

## 2019-11-20 DIAGNOSIS — R41840 Attention and concentration deficit: Secondary | ICD-10-CM

## 2019-11-20 DIAGNOSIS — R4701 Aphasia: Secondary | ICD-10-CM

## 2019-11-20 DIAGNOSIS — R2681 Unsteadiness on feet: Secondary | ICD-10-CM

## 2019-11-20 NOTE — Therapy (Signed)
[facility] [address] [place], [place], [address] Phone: [phone]   Fax:  [phone]  Occupational Therapy Treatment  Patient Details  Name: Justin [name] MRN: [id number] Date of Birth: [birth year] No data recorded  Encounter Date: 11/20/2019   OT End of Session - 11/20/19 1426    Visit Number 2    Number of Visits 13    Authorization Type [employer]    OT Start Time 1315    OT Stop Time 1400    OT Time Calculation (min) 45 min    Activity Tolerance Other (comment)    Behavior During Therapy [facility] for tasks assessed/performed           Past Medical History:  Diagnosis Date  . Brain bleed ([place])   . Cancer [facility])    prostate  . 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 Depression   . Diabetes mellitus without complication ([place])   . High cholesterol   . Hypertension   . Stroke [facility])     Past Surgical History:  Procedure Laterality Date  . arm & hand surgery Left 1972   skill saw accident  . COLON SURGERY     partial  . EYE SURGERY     lens implant and cateract- Left  . PROSTATECTOMY      There were no vitals filed for this visit.   Subjective Assessment - 11/20/19 1415    Subjective  Patient indicates that he does not have pain    Patient is accompanied by: Family member    Pertinent History per wife patient has had 5 strokes, last two in May 2021    Limitations Cognition    Currently in Pain? No/denies    Pain Score 0-No pain                        OT Treatments/Exercises (OP) - 11/20/19 1419      Cognitive Exercises   Other Cognitive Exercises 1 Spoke with patient and wife regarding essential goals relating to concentration.  Patient with severely impaired attention/  Needed overt initial cueing and intermittent cueing thereafter to complete sorting and matching tasks.  Patient able to attend to task for several minutes in busy gym environment.  Patient would visually  attend to alternate stimulation momentarilly, but could easily return to task at hand.  Patient stood to complete a simple matching game.  Patient needed frequent cueing to activate extension in right leg, especially when concentrating on fitting shape into board.  Worked on reaching high slightly outside of his base of support to the right.                    OT Education - 11/20/19 1425    Education Details Importance of concentration as it realtes to safety with mobility    Person(s) Educated Patient;Spouse    Methods Explanation    Comprehension Need further instruction            OT Short Term Goals - 11/20/19 1428      OT SHORT TERM GOAL #1   Title Patient and wife will compile a list of activities patient can safely participate in at home - e.g. making sandwich, picking out clothing, etc. due 12/21/19    Time 4    Period Weeks    Status On-going    Target Date 12/21/19      OT SHORT TERM GOAL #  2   Title Patient will demonstrate ability to attend for 5 min or more to a familiar activity  in minimally distracting environment    Time 4    Period Weeks    Status On-going      OT SHORT TERM GOAL #3   Title Patient will complete a home exercise program to address upper body active movement and general strength with min cueing and set up    Time 4    Period Weeks    Status On-going             OT Long Term Goals - 11/20/19 1428      OT LONG TERM GOAL #1   Title Patient will complete a home activities rogram designed to help him safely engage in IADL with minimal prompting    Time 6    Period Weeks    Status On-going      OT LONG TERM GOAL #2   Title Patient will demonstrate sufficient standing balance to stand at counter top to complete a familiar functional task - eg prepare a snack    Time 6    Period Weeks    Status On-going      OT LONG TERM GOAL #3   Title Patient will follow a daily schedule to outline sleep and wake times, meal times, and activity  times to reduce amount of idol TV watching time during day.    Time 6    Period Weeks    Status On-going                 Plan - 11/20/19 1427    Clinical Impression Statement Patient is a 79 year old man with history of multiple strokes, most recently with two in May 2021.  Patient with prior left hemiparesis, now with right hemiparesis, decreased activity tolerance, decreased balance,  and significant cognitive impairment - decreased focused attention, impulsivity, decreased awareness, initiaition, and memory.    OT Occupational Profile and History Detailed Assessment- Review of Records and additional review of physical, cognitive, psychosocial history related to current functional performance    Occupational performance deficits (Please refer to evaluation for details): ADL's;IADL's    Body Structure / Function / Physical Skills ADL;Coordination;Endurance;GMC;Balance;Decreased knowledge of precautions;Sensation;IADL;Flexibility;Decreased knowledge of use of DME;Body mechanics;Cardiopulmonary status limiting activity;Dexterity;FMC;Strength    Cognitive Skills Attention;Energy/Drive;Memory;Safety Awareness;Problem Solve;Perception;Orientation;Sequencing    Rehab Potential Fair    Clinical Decision Making Limited treatment options, no task modification necessary    Comorbidities Affecting Occupational Performance: May have comorbidities impacting occupational performance    Modification or Assistance to Complete Evaluation  Min-Moderate modification of tasks or assist with assess necessary to complete eval    OT Frequency 2x / week    OT Duration 6 weeks    OT Treatment/Interventions Self-care/ADL training;Therapeutic exercise;Neuromuscular education;Patient/family education;Balance training;Therapeutic activities;Functional Mobility Training;Cognitive remediation/compensation;DME and/or AE instruction    Plan Need to work on focused attention - table top exercise in min distracting  environment - e.g.  making simple sandwich, NMR RUE/LE, functional mobility    Consulted and Agree with Plan of Care Patient;Family member/caregiver    Family Member Consulted [name]           Patient will benefit from skilled therapeutic intervention in order to improve the following deficits and impairments:   Body Structure / Function / Physical Skills: ADL, Coordination, Endurance, GMC, Balance, Decreased knowledge of precautions, Sensation, IADL, Flexibility, Decreased knowledge of use of DME, Body mechanics, Cardiopulmonary status limiting activity,  Dexterity, [place], Strength Cognitive Skills: Attention, Energy/Drive, Memory, Safety Awareness, Problem Solve, Perception, Orientation, Sequencing     Visit Diagnosis: Attention and concentration deficit  Unsteadiness on feet  Muscle weakness (generalized)    Problem List Patient Active Problem List   Diagnosis Date Noted  . Labile blood glucose   . 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 OSA (obstructive sleep apnea)   . Controlled type 2 diabetes mellitus with hyperglycemia, without long-term current use of insulin ([place])   . CVA (cerebral vascular accident) ([place]) 07/16/2019  . Dyslipidemia   . Benign essential HTN   . Diabetes mellitus type 2 in obese ([place])   . Major depressive disorder, recurrent episode, moderate ([place])   . Gait disorder   . Acute right MCA stroke ([place]) 06/28/2019  . Ambulatory dysfunction   . Stroke ([place]) 06/24/2019  . Type 2 diabetes mellitus ([place]) 10/04/2018  . Depression 10/04/2018  . Acute CVA (cerebrovascular accident) ([place]) 09/18/2017  . Stroke (cerebrum) ([place]) 09/17/2017  . Diabetes mellitus without complication ([place]) 78/58/8502  . Closed head injury 04/22/2013  . HLD (hyperlipidemia) 07/29/2009  . Essential hypertension 07/29/2009  . EFFUSION, PLEURAL 07/29/2009  . DIVERTICULITIS, COLON 07/29/2009  . 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 COLONIC POLYPS, HYPERPLASTIC, HX OF  07/29/2009    Mariah Milling, OTR/L 11/20/2019, 2:29 PM  [facility] [address] [place], [place], [address] Phone: [phone]   Fax:  [phone]  Name: ADEEB KONECNY MRN: [id number] Date of Birth: Feb 25, 1940

## 2019-11-20 NOTE — Therapy (Signed)
[facility] [address] [place], [place], [address] Phone: [phone]   Fax:  [phone]  Physical Therapy Treatment  Patient Details  Name: Justin [name] MRN: [id number] Date of Birth: 1940-10-17 Referring Provider (PT): Antony Contras, MD   Encounter Date: 11/20/2019   PT End of Session - 11/20/19 1234    Visit Number 7    Number of Visits 17    Date for PT Re-Evaluation 01/07/20   POC for 8 weeks, Cert for 90 days   Authorization Type [employer] (Auth Required)    Progress Note Due on Visit 10    PT Start Time 1230    PT Stop Time 1314    PT Time Calculation (min) 44 min    Equipment Utilized During Treatment Gait belt    Activity Tolerance Patient tolerated treatment well    Behavior During Therapy [facility] for tasks assessed/performed           Past Medical History:  Diagnosis Date  . Brain bleed ([place])   . Cancer [facility])    prostate  . 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 Depression   . Diabetes mellitus without complication ([place])   . High cholesterol   . Hypertension   . Stroke [facility])     Past Surgical History:  Procedure Laterality Date  . arm & hand surgery Left 1972   skill saw accident  . COLON SURGERY     partial  . EYE SURGERY     lens implant and cateract- Left  . PROSTATECTOMY      There were no vitals filed for this visit.   Subjective Assessment - 11/20/19 1235    Subjective Patient/wife reports that had a fall this morning, slide off the commode. This is the only fall that has occured since last visit. No pain. Wife reports that he is not completing exercises daily bc he is "lazy"    Patient is accompained by: Family member   Wife   Pertinent History history of CVA, HTN, hypercholesterolemia, diabetes, depression, bursitis in R hip, and intracranial hemorrhage.    Limitations Walking;Standing    How long can you walk comfortably? 10-15 minutes    Patient Stated Goals  Patient wants to walk straight, get back to doing what was before    Currently in Pain? Yes    Pain Score 5     Pain Location Knee    Pain Orientation Right    Pain Descriptors / Indicators Aching                             [facility] Adult PT Treatment/Exercise - 11/20/19 0001      Transfers   Transfers Sit to Stand;Stand to Sit    Sit to Stand 4: Min guard    Stand to Sit 5: Supervision    Comments completd sit <> stand training from blue mat, PT providing verbal cues for improved forward lean and hand placement. Completed x 5 reps with CGA as needed. limited carryover noted throughout session.       Ambulation/Gait   Ambulation/Gait Yes    Ambulation/Gait Assistance 4: Min guard    Ambulation/Gait Assistance Details ambulating into/out of therapy session with RW and CGA from PT. PT providing max verbal cues for swinging leg.     Ambulation Distance (Feet) 75 Feet    Assistive device Rolling walker    Gait  Pattern Step-through pattern;Decreased arm swing - right;Decreased arm swing - left;Decreased step length - right;Decreased stance time - right;Decreased dorsiflexion - right;Decreased hip/knee flexion - right;Poor foot clearance - right    Ambulation Surface Level;Indoor      Neuro Re-ed    Neuro Re-ed Details  Standing without UE support: completed alteranting toe taps to colored pebbles, with PT calling out color to tap, completed x 15 reps without UE support. Completed forward/backward steps withotu UE support x 10 reps. PT providing verbal cues for improved step length and not dragging RLE. Completed dual tasking of standing without UE suppot completing self toss of ball x 2 minutes. Patient requiring seated rest breaks. Folllowed by addition of standing without UE support and completing self ball toss with addition of cognitive task (naming fruits/vegetables/counting by 2's). Increased difficulty noted with dual tasking. Completed 2 x 3 minutes.                    PT Education - 11/20/19 1414    Education Details importance complete HEP to make progress    Person(s) Educated Patient    Methods Explanation    Comprehension Verbalized understanding            PT Short Term Goals - 11/06/19 1200      PT SHORT TERM GOAL #1   Title Patient will be independent with HEP for strengthening/balance with wife superivison (ALL STGs Due: 11/06/19)    Baseline wife reports only doing seated exercies, every other day    Time 4    Period Weeks    Status Not Met    Target Date 11/06/19      PT SHORT TERM GOAL #2   Title Pt will improve 5x sit<>stand to less than or equal to 25 seconds for improved transfer efficiency and safety.    Baseline 31.34 secs, 29.84 seconds w/ UE support    Time 4    Period Weeks    Status Not Met      PT SHORT TERM GOAL #3   Title Pt will improve TUG score to less than or equal to 20 seconds for decreased fall risk.    Baseline 31.57 secs w/ RW, 25.63 secs w/ RW    Time 4    Period Weeks    Status Not Met      PT SHORT TERM GOAL #4   Title Berg Balance to be Assessed and LTG to be set as appropriate    Baseline 36/56 on 10/16/19    Time 4    Period Weeks    Status Achieved      PT SHORT TERM GOAL #5   Title Patient will demonstrate ability to ambulate >300 ft with LRAD and CGA for improved household mobility    Baseline 275 ft w/ CGA, max verbal cues    Time 4    Period Weeks    Status Partially Met             PT Long Term Goals - 10/16/19 1552      PT LONG TERM GOAL #1   Title Patient will be independent with final HEP for improved strength and balance (ALL LTGs Due: 12/04/19)    Baseline no HEP established    Time 8    Period Weeks    Status New      PT LONG TERM GOAL #2   Title Pt will improve gait velocity to at least 2.0 ft/sec for decreased fall risk,  improved gait efficiency.    Time 8    Period Weeks    Status New      PT LONG TERM GOAL #3   Title Pt will improve BERG to  at least a 41/56 to demo decr fall risk.    Baseline 36/56    Time 8    Period Weeks    Status Revised      PT LONG TERM GOAL #4   Title Patient will improve TUG score to <15 seconds to demosntrate improved mobility and reduced fall risk    Baseline 31.57 secs    Time 8    Period Weeks    Status New      PT LONG TERM GOAL #5   Title Patient will improved 5x sit <> stand to </= 15 seconds to demonstrate improved functional mobility    Baseline 31.34 secs    Time 8    Period Weeks    Status New      PT LONG TERM GOAL #6   Title Patient and wife will report understanding of fall prevention within the home    Baseline currently dependent    Time 8    Period Weeks    Status New                 Plan - 11/20/19 1422    Clinical Impression Statement Today's skilled PT session included continued activites promoting improved step length and balance without UE support. Patient demo increased difficulty with addition of dual tasking. PT educating on importance of HEP completion to make progress with PT services. Will continue to benefit from skilled PT services to progress toward all goals.    Personal Factors and Comorbidities Behavior Pattern;Comorbidity 3+    Comorbidities history of CVA, HTN, hypercholesterolemia, diabetes, depression, bursitis in R hip, and intracranial hemorrhage.    Examination-Activity Limitations Bed Mobility;Sit;Transfers;Stand;Toileting;Bathing;Dressing    Examination-Participation Restrictions [employer] Activity    Stability/Clinical Decision Making Evolving/Moderate complexity    Rehab Potential Fair    PT Frequency 2x / week    PT Duration 8 weeks    PT Treatment/Interventions ADLs/Self Care Home Management;Cryotherapy;Electrical Stimulation;Moist Heat;DME Instruction;Gait training;Functional mobility Scientist, forensic;Therapeutic activities;Therapeutic exercise;Neuromuscular re-education;Balance training;Patient/family  education;Orthotic Fit/Training;Manual techniques;Passive range of motion    PT Next Visit Plan review/quiz on HEP, seated/standing balance and strengthening, sit <> stand training, gait (maybe try colored bands for target). Complete session in therapy room if able for improved participation.    Consulted and Agree with Plan of Care Patient;Family member/caregiver    Family Member Consulted Wife           Patient will benefit from skilled therapeutic intervention in order to improve the following deficits and impairments:  Abnormal gait, Decreased endurance, Decreased balance, Decreased mobility, Difficulty walking, Hypomobility, Postural dysfunction, Pain, Decreased strength, Decreased safety awareness, Decreased knowledge of use of DME, Decreased coordination, Decreased activity tolerance, Decreased cognition, Decreased range of motion  Visit Diagnosis: Unsteadiness on feet  Muscle weakness (generalized)  Other abnormalities of gait and mobility     Problem List Patient Active Problem List   Diagnosis Date Noted  . Labile blood glucose   . 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 OSA (obstructive sleep apnea)   . Controlled type 2 diabetes mellitus with hyperglycemia, without long-term current use of insulin ([place])   . CVA (cerebral vascular accident) ([place]) 07/16/2019  . Dyslipidemia   . Benign essential HTN   . Diabetes mellitus type 2  in obese ([place])   . Major depressive disorder, recurrent episode, moderate ([place])   . Gait disorder   . Acute right MCA stroke ([place]) 06/28/2019  . Ambulatory dysfunction   . Stroke ([place]) 06/24/2019  . Type 2 diabetes mellitus ([place]) 10/04/2018  . Depression 10/04/2018  . Acute CVA (cerebrovascular accident) ([place]) 09/18/2017  . Stroke (cerebrum) ([place]) 09/17/2017  . Diabetes mellitus without complication ([place]) 12/13/1171  . Closed head injury 04/22/2013  . HLD (hyperlipidemia) 07/29/2009  . Essential hypertension 07/29/2009  .  EFFUSION, PLEURAL 07/29/2009  . DIVERTICULITIS, COLON 07/29/2009  . 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 COLONIC POLYPS, HYPERPLASTIC, HX OF 07/29/2009    Jones Bales, PT, DPT 11/20/2019, 2:26 PM  [facility] [address] [place], [place], [address] Phone: [phone]   Fax:  [phone]  Name: JONATHYN CAROTHERS MRN: [id number] Date of Birth: [birth year]

## 2019-11-21 NOTE — Therapy (Signed)
[facility] [address] [place], [place], [address] Phone: [phone]   Fax:  [phone]  Speech Language Pathology Treatment  Patient Details  Name: Justin [name] MRN: [id number] Date of Birth: [birth year] Referring Provider (SLP): Dr. Leonie Man   Encounter Date: 11/20/2019   End of Session - 11/21/19 1145    Visit Number 3    Number of Visits 9    Date for SLP Re-Evaluation 01/06/20    SLP Start Time 1497    SLP Stop Time  0263    SLP Time Calculation (min) 42 min    Activity Tolerance Patient tolerated treatment well           Past Medical History:  Diagnosis Date   Brain bleed ([place])    Cancer ([place])    prostate   CHI (closed head injury) 04/2013   with SAH   Depression    Diabetes mellitus without complication ([place])    High cholesterol    Hypertension    Stroke [facility])     Past Surgical History:  Procedure Laterality Date   arm & hand surgery Left 1972   skill saw accident   COLON SURGERY     partial   EYE SURGERY     lens implant and cateract- Left   PROSTATECTOMY      There were no vitals filed for this visit.   Subjective Assessment - 11/21/19 1131    Subjective "He doesn't talk. He just watches TV"    Patient is accompained by: Family member   [name]   Currently in Pain? No/denies                 ADULT SLP TREATMENT - 11/21/19 1131      General Information   Behavior/Cognition Alert;Distractible;Requires cueing      Treatment Provided   Treatment provided Cognitive-Linquistic      Pain Assessment   Pain Assessment No/denies pain      Cognitive-Linquistic Treatment   Treatment focused on Cognition    Skilled Treatment Pt homework was <20% accurate (simple concrete categorization), pt with no awareness. Even with max A pt unable to complete these correctly. Pt did bring binder today. Spouse reported frustration that she is unable to have a conversation with pt, so  SLP worked with them to [place] personally relevant topics for pt to begin creating some visual supports for conversations in pt's binder. With visual aids for [employer] and [employer] teams, pt gestured to preferred teams with min questioning from SLP, occasionally verbalized his responses and did make 2 additional comments. Initiated lists for personally relevant topics such as locations/outings, foods, people, and interests. Spouse often using direction questioning/confrontation when cuing pt. SLP educated, demonstrated how to use open ended and Y/N questioning, and rationale for focus on understanding pt's message vs requiring pt to produce specific words.      Assessment / Recommendations / Plan   Plan Continue with current plan of care      Progression Toward Goals   Progression toward goals Progressing toward goals                SLP Long Term Goals - 11/21/19 1153      SLP LONG TERM GOAL #1   Title Pt will sustain attention to simple cognitive-linguistic tasks 5 minutes x 3 per session, over 2 visits.    Time 3    Period Weeks    Status On-going      SLP  LONG TERM GOAL #2   Title Pt will access/retrieve schedule or appointment information from memory system with min cues from spouse, x 3 sessions.    Time 3    Period Weeks    Status On-going      SLP LONG TERM GOAL #3   Title Pt will participate in 5 minute simple conversation with supports/compensations x 3 visits.    Time 3    Period Weeks    Status On-going            Plan - 11/21/19 1146    Clinical Impression Statement Justin [name] presents with likely moderate-severe cognitive communication impairments s/p multiple CVAs, a decline from mild-moderate deficits when last seen at our clinic in 2019. Cognitive-linguistic testing initiated (CLQT) and to be completed; deferred this today due to presence of spouse and need for caregiver education/collaboration for pt care plan. Pt unlikely to make progress/achieve carryover without family  involvement due to severity of deficits. I recommend trial course of ST for 4 weeks to determine if pt can make gains or benefit from pt/family compensation training.    Speech Therapy Frequency 2x / week    Duration 4 weeks   or 9 total visits   Treatment/Interventions SLP instruction and feedback;Functional tasks;Cognitive reorganization;Compensatory strategies;Patient/family education;Multimodal communcation approach;Internal/external aids    Potential to Achieve Goals Fair    Potential Considerations Ability to learn/carryover information;Cooperation/participation level;Medical prognosis;Severity of impairments;Previous level of function    Consulted and Agree with Plan of Care Patient;Family member/caregiver    Family Member Consulted spouse           Patient will benefit from skilled therapeutic intervention in order to improve the following deficits and impairments:   Cognitive communication deficit  Aphasia    Problem List Patient Active Problem List   Diagnosis Date Noted   Labile blood glucose    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   OSA (obstructive sleep apnea)    Controlled type 2 diabetes mellitus with hyperglycemia, without long-term current use of insulin (HCC)    CVA (cerebral vascular accident) ([place]) 07/16/2019   Dyslipidemia    Benign essential HTN    Diabetes mellitus type 2 in obese ([place])    Major depressive disorder, recurrent episode, moderate (HCC)    Gait disorder    Acute right MCA stroke ([place]) 06/28/2019   Ambulatory dysfunction    Stroke ([place]) 06/24/2019   Type 2 diabetes mellitus ([place]) 10/04/2018   Depression 10/04/2018   Acute CVA (cerebrovascular accident) ([place]) 09/18/2017   Stroke (cerebrum) ([place]) 09/17/2017   Diabetes mellitus without complication ([place]) 13/09/6576   Closed head injury 04/22/2013   HLD (hyperlipidemia) 07/29/2009   Essential hypertension 07/29/2009   EFFUSION, PLEURAL 07/29/2009    DIVERTICULITIS, COLON 07/29/2009   HYPERBILIRUBINEMIA 07/29/2009   COLONIC POLYPS, HYPERPLASTIC, HX OF 07/29/2009   Deneise Lever, [place], CCC-SLP Speech-Language Pathologist  Aliene Altes 11/21/2019, 12:01 PM  [facility] [address] [place], [place], [address] Phone: [phone]   Fax:  [phone]   Name: Justin [name] MRN: [id number] Date of Birth: [birth year]

## 2019-11-22 ENCOUNTER — Ambulatory Visit: Payer: Medicare HMO | Admitting: Occupational Therapy

## 2019-11-22 ENCOUNTER — Ambulatory Visit: Payer: Medicare HMO

## 2019-11-27 ENCOUNTER — Ambulatory Visit: Payer: Medicare HMO | Admitting: Occupational Therapy

## 2019-11-27 ENCOUNTER — Ambulatory Visit: Payer: Medicare HMO | Admitting: Speech Pathology

## 2019-11-27 ENCOUNTER — Encounter: Payer: Self-pay | Admitting: Occupational Therapy

## 2019-11-27 ENCOUNTER — Ambulatory Visit: Payer: Medicare HMO | Attending: Neurology | Admitting: Physical Therapy

## 2019-11-27 ENCOUNTER — Other Ambulatory Visit: Payer: Self-pay

## 2019-11-27 DIAGNOSIS — R41840 Attention and concentration deficit: Secondary | ICD-10-CM

## 2019-11-27 DIAGNOSIS — R41841 Cognitive communication deficit: Secondary | ICD-10-CM

## 2019-11-27 DIAGNOSIS — R2681 Unsteadiness on feet: Secondary | ICD-10-CM | POA: Diagnosis present

## 2019-11-27 DIAGNOSIS — R4701 Aphasia: Secondary | ICD-10-CM | POA: Insufficient documentation

## 2019-11-27 DIAGNOSIS — M6281 Muscle weakness (generalized): Secondary | ICD-10-CM | POA: Insufficient documentation

## 2019-11-27 DIAGNOSIS — R2689 Other abnormalities of gait and mobility: Secondary | ICD-10-CM | POA: Diagnosis present

## 2019-11-27 NOTE — Therapy (Signed)
[facility] [address] [place], [place], [address] Phone: [phone]   Fax:  [phone]  Physical Therapy Treatment  Patient Details  Name: Justin [name] MRN: [id number] Date of Birth: 31-May-1940 Referring Provider (PT): Antony Contras, MD   Encounter Date: 11/27/2019   PT End of Session - 11/27/19 1321    Visit Number 8    Number of Visits 17    Date for PT Re-Evaluation 01/07/20   POC for 8 weeks, Cert for 90 days   Authorization Type [employer] (Auth Required)    Progress Note Due on Visit 10    PT Start Time 1232    PT Stop Time 1313    PT Time Calculation (min) 41 min    Equipment Utilized During Treatment Gait belt    Activity Tolerance Patient tolerated treatment well   easily distractable   Behavior During Therapy [facility] for tasks assessed/performed           Past Medical History:  Diagnosis Date  . Brain bleed ([place])   . Cancer [facility])    prostate  . 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 Depression   . Diabetes mellitus without complication ([place])   . High cholesterol   . Hypertension   . Stroke [facility])     Past Surgical History:  Procedure Laterality Date  . arm & hand surgery Left 1972   skill saw accident  . COLON SURGERY     partial  . EYE SURGERY     lens implant and cateract- Left  . PROSTATECTOMY      There were no vitals filed for this visit.   Subjective Assessment - 11/27/19 1235    Subjective No falls. States that he has been doing most of his exercises at home.    Patient is accompained by: Family member   Wife   Pertinent History history of CVA, HTN, hypercholesterolemia, diabetes, depression, bursitis in R hip, and intracranial hemorrhage.    Limitations Walking;Standing    How long can you walk comfortably? 10-15 minutes    Patient Stated Goals Patient wants to walk straight, get back to doing what was before    Currently in Pain? No/denies                              [facility] Adult PT Treatment/Exercise - 11/27/19 1245      Ambulation/Gait   Ambulation/Gait Yes    Ambulation/Gait Assistance 5: Supervision;4: Min guard    Ambulation/Gait Assistance Details performed 1 lap with use of theraband at bottom of front of walker with 2 colorful visual targets for pt to step to with RLE for incr step length, pt can demonstrate improved step length with RLE for approx. 5 steps with max verbal cues to attend to target, however reverts back to dragging R foot when distracted    Ambulation Distance (Feet) 115 Feet    Assistive device Rolling walker    Gait Pattern Step-through pattern;Decreased arm swing - right;Decreased arm swing - left;Decreased step length - right;Decreased stance time - right;Decreased dorsiflexion - right;Decreased hip/knee flexion - right;Poor foot clearance - right    Ambulation Surface Level;Indoor               Balance Exercises - 11/27/19 0001      Balance Exercises: Standing   Standing Eyes Opened Foam/compliant surface;Limitations    Standing Eyes Opened Limitations  feet hip width distance, 2 x 10 reps head turns, 2 x 10 reps head nods - pt needs max verbal cues to the task at hand, pt easily distractable    SLS Eyes open;Intermittent upper extremity support;Limitations    SLS Limitations alternating taps to 2 colorful floor bubbles, standing at RW with intermittent UE support, therapist calling out colors for pt to tap x12 reps    Marching Upper extremity assist 2;Limitations    Marching Limitations standing at RW: x10 reps alternating marching tapping boomwhacker as visual cue (with therapist holding it up), performed an additional x10 reps with use of red tband resistance    Sit to Stand Elevated surface;Without upper extremity support;Upper extremity support;Foam/compliant surface;Limitations    Sit to Stand Time x10 reps from blue air ex, cues for nose over toes and to push off mat with BUE,  holding static standing for approx. 5 seconds and lowering down to the mat with no UE support for eccentric control    Other Standing Exercises standing on blue airex: reaching with RUE and grabbing bean bag from therapist and tossing into crate x20 reps, min guard for balance               PT Short Term Goals - 11/06/19 1200      PT SHORT TERM GOAL #1   Title Patient will be independent with HEP for strengthening/balance with wife superivison (ALL STGs Due: 11/06/19)    Baseline wife reports only doing seated exercies, every other day    Time 4    Period Weeks    Status Not Met    Target Date 11/06/19      PT SHORT TERM GOAL #2   Title Pt will improve 5x sit<>stand to less than or equal to 25 seconds for improved transfer efficiency and safety.    Baseline 31.34 secs, 29.84 seconds w/ UE support    Time 4    Period Weeks    Status Not Met      PT SHORT TERM GOAL #3   Title Pt will improve TUG score to less than or equal to 20 seconds for decreased fall risk.    Baseline 31.57 secs w/ RW, 25.63 secs w/ RW    Time 4    Period Weeks    Status Not Met      PT SHORT TERM GOAL #4   Title Berg Balance to be Assessed and LTG to be set as appropriate    Baseline 36/56 on 10/16/19    Time 4    Period Weeks    Status Achieved      PT SHORT TERM GOAL #5   Title Patient will demonstrate ability to ambulate >300 ft with LRAD and CGA for improved household mobility    Baseline 275 ft w/ CGA, max verbal cues    Time 4    Period Weeks    Status Partially Met             PT Long Term Goals - 10/16/19 1552      PT LONG TERM GOAL #1   Title Patient will be independent with final HEP for improved strength and balance (ALL LTGs Due: 12/04/19)    Baseline no HEP established    Time 8    Period Weeks    Status New      PT LONG TERM GOAL #2   Title Pt will improve gait velocity to at least 2.0 ft/sec for decreased fall risk, improved  gait efficiency.    Time 8    Period Weeks     Status New      PT LONG TERM GOAL #3   Title Pt will improve BERG to at least a 41/56 to demo decr fall risk.    Baseline 36/56    Time 8    Period Weeks    Status Revised      PT LONG TERM GOAL #4   Title Patient will improve TUG score to <15 seconds to demosntrate improved mobility and reduced fall risk    Baseline 31.57 secs    Time 8    Period Weeks    Status New      PT LONG TERM GOAL #5   Title Patient will improved 5x sit <> stand to </= 15 seconds to demonstrate improved functional mobility    Baseline 31.34 secs    Time 8    Period Weeks    Status New      PT LONG TERM GOAL #6   Title Patient and wife will report understanding of fall prevention within the home    Baseline currently dependent    Time 8    Period Weeks    Status New                 Plan - 11/27/19 1323    Clinical Impression Statement Today's skilled session focused on standing balance strategies with decr UE support with SLS and on compliant surfaces. Pt needing verbal cues at times to focus on the task at hand, as pt can be easily distractable in the therapy gym environment. Tried using a theraband with visual targets at bottom for incr step length with RLE, it did not work well for >5 steps due to pt being easily distracted and reverting back to dragging R foot. Will continue to progress towards LTGs.    Personal Factors and Comorbidities Behavior Pattern;Comorbidity 3+    Comorbidities history of CVA, HTN, hypercholesterolemia, diabetes, depression, bursitis in R hip, and intracranial hemorrhage.    Examination-Activity Limitations Bed Mobility;Sit;Transfers;Stand;Toileting;Bathing;Dressing    Examination-Participation Restrictions [employer] Activity    Stability/Clinical Decision Making Evolving/Moderate complexity    Rehab Potential Fair    PT Frequency 2x / week    PT Duration 8 weeks    PT Treatment/Interventions ADLs/Self Care Home Management;Cryotherapy;Electrical  Stimulation;Moist Heat;DME Instruction;Gait training;Functional mobility Scientist, forensic;Therapeutic activities;Therapeutic exercise;Neuromuscular re-education;Balance training;Patient/family education;Orthotic Fit/Training;Manual techniques;Passive range of motion    PT Next Visit Plan review/quiz on HEP, seated/standing balance and strengthening, sit <> stand training, gait (maybe try colored bands for target). Complete session in therapy room if able for improved participation.    Consulted and Agree with Plan of Care Patient;Family member/caregiver    Family Member Consulted Wife           Patient will benefit from skilled therapeutic intervention in order to improve the following deficits and impairments:  Abnormal gait, Decreased endurance, Decreased balance, Decreased mobility, Difficulty walking, Hypomobility, Postural dysfunction, Pain, Decreased strength, Decreased safety awareness, Decreased knowledge of use of DME, Decreased coordination, Decreased activity tolerance, Decreased cognition, Decreased range of motion  Visit Diagnosis: Unsteadiness on feet  Muscle weakness (generalized)  Other abnormalities of gait and mobility     Problem List Patient Active Problem List   Diagnosis Date Noted  . Labile blood glucose   . 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 OSA (obstructive sleep apnea)   . Controlled type 2 diabetes mellitus with hyperglycemia,  without long-term current use of insulin ([place])   . CVA (cerebral vascular accident) ([place]) 07/16/2019  . Dyslipidemia   . Benign essential HTN   . Diabetes mellitus type 2 in obese ([place])   . Major depressive disorder, recurrent episode, moderate ([place])   . Gait disorder   . Acute right MCA stroke ([place]) 06/28/2019  . Ambulatory dysfunction   . Stroke ([place]) 06/24/2019  . Type 2 diabetes mellitus ([place]) 10/04/2018  . Depression 10/04/2018  . Acute CVA (cerebrovascular accident) ([place]) 09/18/2017  . Stroke  (cerebrum) ([place]) 09/17/2017  . Diabetes mellitus without complication ([place]) 35/33/1740  . Closed head injury 04/22/2013  . HLD (hyperlipidemia) 07/29/2009  . Essential hypertension 07/29/2009  . EFFUSION, PLEURAL 07/29/2009  . DIVERTICULITIS, COLON 07/29/2009  . 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 COLONIC POLYPS, HYPERPLASTIC, HX OF 07/29/2009    Arliss Journey, PT, DPT  11/27/2019, 1:29 PM  [facility] [address] [place], [place], [address] Phone: [phone]   Fax:  [phone]  Name: Justin [name] MRN: [id number] Date of Birth: [birth year]

## 2019-11-27 NOTE — Therapy (Signed)
[facility] [address] [place], [place], [address] Phone: [phone]   Fax:  [phone]  Occupational Therapy Treatment  Patient Details  Name: Justin [name] MRN: [id number] Date of Birth: March 06, 1940 No data recorded  Encounter Date: 11/27/2019   OT End of Session - 11/27/19 1411    Visit Number 3    Number of Visits [place]    OT Start Time 3329    OT Stop Time 1400    OT Time Calculation (min) 45 min    Activity Tolerance Patient tolerated treatment well    Behavior During Therapy [facility] for tasks assessed/performed           Past Medical History:  Diagnosis Date   Brain bleed ([place])    Cancer ([place])    prostate   CHI (closed head injury) 04/2013   with SAH   Depression    Diabetes mellitus without complication ([place])    High cholesterol    Hypertension    Stroke [facility])     Past Surgical History:  Procedure Laterality Date   arm & hand surgery Left 1972   skill saw accident   COLON SURGERY     partial   EYE SURGERY     lens implant and cateract- Left   PROSTATECTOMY      There were no vitals filed for this visit.   Subjective Assessment - 11/27/19 1323    Subjective  Patient indicated that they had a garage sale this weekend    Patient is accompanied by: Family member    Pertinent History per wife patient has had 5 strokes, last two in May 2021    Limitations Cognition    Currently in Pain? No/denies    Pain Score 0-No pain                        OT Treatments/Exercises (OP) - 11/27/19 0001      ADLs   Functional Mobility Worked on functional ambulation - backing up and turning are particularly challenging.  Patient needs constant cueing to step threough with right foot.        Cognitive Exercises   Other Cognitive Exercises 1 Worked on sustained and selective attention in busy gym environment.  Initially worked on Press photographer - patient completed without difficulty - field of 12.  Increased challenge by working in standing - patient then distracted by back pain, and needed occasional cueing to activate RLE in standing.  Provided physical assistance to align right leg for better standing.  Patient able to sort card - field of 24 with moderate cueing                    OT Short Term Goals - 11/27/19 1412      OT SHORT TERM GOAL #1   Title Patient and wife will compile a list of activities patient can safely participate in at home - e.g. making sandwich, picking out clothing, etc. due 12/21/19    Time 4    Period Weeks    Status On-going    Target Date 12/21/19      OT SHORT TERM GOAL #2   Title Patient will demonstrate ability to attend for 5 min or more to a familiar activity  in minimally distracting environment    Time 4    Period Weeks    Status On-going  OT SHORT TERM GOAL #3   Title Patient will complete a home exercise program to address upper body active movement and general strength with min cueing and set up    Time 4    Period Weeks    Status On-going             OT Long Term Goals - 11/27/19 1412      OT LONG TERM GOAL #1   Title Patient will complete a home activities rogram designed to help him safely engage in IADL with minimal prompting    Time 6    Period Weeks    Status On-going      OT LONG TERM GOAL #2   Title Patient will demonstrate sufficient standing balance to stand at counter top to complete a familiar functional task - eg prepare a snack    Time 6    Period Weeks    Status On-going      OT LONG TERM GOAL #3   Title Patient will follow a daily schedule to outline sleep and wake times, meal times, and activity times to reduce amount of idol TV watching time during day.    Time 6    Period Weeks    Status On-going                 Plan - 11/27/19 1411    Clinical Impression Statement Patient is showing improved sustained and even  selective attention when not physically challenged.    OT Occupational Profile and History Detailed Assessment- Review of Records and additional review of physical, cognitive, psychosocial history related to current functional performance    Occupational performance deficits (Please refer to evaluation for details): ADL's;IADL's    Body Structure / Function / Physical Skills ADL;Coordination;Endurance;GMC;Balance;Decreased knowledge of precautions;Sensation;IADL;Flexibility;Decreased knowledge of use of DME;Body mechanics;Cardiopulmonary status limiting activity;Dexterity;FMC;Strength    Cognitive Skills Attention;Energy/Drive;Memory;Safety Awareness;Problem Solve;Perception;Orientation;Sequencing    Rehab Potential Fair    Clinical Decision Making Limited treatment options, no task modification necessary    Comorbidities Affecting Occupational Performance: May have comorbidities impacting occupational performance    Modification or Assistance to Complete Evaluation  Min-Moderate modification of tasks or assist with assess necessary to complete eval    OT Frequency 2x / week    OT Duration 6 weeks    OT Treatment/Interventions Self-care/ADL training;Therapeutic exercise;Neuromuscular education;Patient/family education;Balance training;Therapeutic activities;Functional Mobility Training;Cognitive remediation/compensation;DME and/or AE instruction    Plan Need to work on selective attention while standing or walking- e.g.  making simple sandwich, NMR RUE/LE    Consulted and Agree with Plan of Care Patient;Family member/caregiver    Family Member Consulted [name]           Patient will benefit from skilled therapeutic intervention in order to improve the following deficits and impairments:   Body Structure / Function / Physical Skills: ADL, Coordination, Endurance, GMC, Balance, Decreased knowledge of precautions, Sensation, IADL, Flexibility, Decreased knowledge of use of DME, Body mechanics,  Cardiopulmonary status limiting activity, Dexterity, FMC, Strength Cognitive Skills: Attention, Energy/Drive, Memory, Safety Awareness, Problem Solve, Perception, Orientation, Sequencing     Visit Diagnosis: Attention and concentration deficit  Unsteadiness on feet  Muscle weakness (generalized)    Problem List Patient Active Problem List   Diagnosis Date Noted   Labile blood glucose    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   OSA (obstructive sleep apnea)    Controlled type 2 diabetes mellitus with hyperglycemia, without long-term current use of insulin ([place])  CVA (cerebral vascular accident) ([place]) 07/16/2019   Dyslipidemia    Benign essential HTN    Diabetes mellitus type 2 in obese [facility])    Major depressive disorder, recurrent episode, moderate (HCC)    Gait disorder    Acute right MCA stroke ([place]) 06/28/2019   Ambulatory dysfunction    Stroke ([place]) 06/24/2019   Type 2 diabetes mellitus ([place]) 10/04/2018   Depression 10/04/2018   Acute CVA (cerebrovascular accident) ([place]) 09/18/2017   Stroke (cerebrum) ([place]) 09/17/2017   Diabetes mellitus without complication ([place]) 18/84/1660   Closed head injury 04/22/2013   HLD (hyperlipidemia) 07/29/2009   Essential hypertension 07/29/2009   EFFUSION, PLEURAL 07/29/2009   DIVERTICULITIS, COLON 07/29/2009   HYPERBILIRUBINEMIA 07/29/2009   COLONIC POLYPS, HYPERPLASTIC, HX OF 07/29/2009    Mariah Milling, OTR/L 11/27/2019, 2:13 PM  [facility] [address] [place], [place], [address] Phone: [phone]   Fax:  [phone]  Name: TABOR DENHAM MRN: [id number] Date of Birth: 11/25/1940

## 2019-11-27 NOTE — Therapy (Signed)
[facility] [address] [place], [place], [address] Phone: [phone]   Fax:  [phone]  Speech Language Pathology Treatment  Patient Details  Name: Justin [name] MRN: [id number] Date of Birth: [birth year] Referring Provider (SLP): Dr. Leonie [name]   Encounter Date: 11/27/2019   End of Session - 11/27/19 1301    Visit Number 4    Number of Visits 9    Date for SLP Re-Evaluation 01/06/20    SLP Start Time 1147    SLP Stop Time  1228    SLP Time Calculation (min) 41 min    Activity Tolerance Patient tolerated treatment well           Past Medical History:  Diagnosis Date  . Brain bleed ([place])   . Cancer [facility])    prostate  . 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 Depression   . Diabetes mellitus without complication ([place])   . High cholesterol   . Hypertension   . Stroke [facility])     Past Surgical History:  Procedure Laterality Date  . arm & hand surgery Left 1972   skill saw accident  . COLON SURGERY     partial  . EYE SURGERY     lens implant and cateract- Left  . PROSTATECTOMY      There were no vitals filed for this visit.          ADULT SLP TREATMENT - 11/27/19 1215      General Information   Behavior/Cognition Alert;Distractible;Requires cueing      Treatment Provided   Treatment provided Cognitive-Linquistic      Pain Assessment   Pain Assessment No/denies pain      Cognitive-Linquistic Treatment   Treatment focused on Cognition    Skilled Treatment Wife waited in waiting room today. Pt did not bring binder. Completed Cognitive Linguistic Quick Test for assessment of cognition. Pt scored as follows: Attention, 120/215 (mild for ages 29+), Memory 100/185 (moderate), Executive functions 16/40 (mild), Language 23/37 (moderate), Visuospatial skills 55/105 (mild), clock drawing 5/13 (severe). Tabletop attention tasks, pt able to maintain sustained attention 5 minutes x 2, selective  attention (SLP typing/scoring test) with verbal cue x1) for 3 minutes. Simple conversation re: football and pt's history of coaching softball with occasional min-mod cues to complete responses.      Assessment / Recommendations / Plan   Plan Continue with current plan of care      Progression Toward Goals   Progression toward goals Progressing toward goals                SLP Long Term Goals - 11/27/19 1304      SLP LONG TERM GOAL #1   Title Pt will sustain attention to simple cognitive-linguistic tasks 5 minutes x 3 per session, over 2 visits.    Time 2    Period Weeks    Status On-going      SLP LONG TERM GOAL #2   Title Pt will access/retrieve schedule or appointment information from memory system with min cues from spouse, x 3 sessions.    Time 2    Period Weeks    Status On-going      SLP LONG TERM GOAL #3   Title Pt will participate in 5 minute simple conversation with supports/compensations x 3 visits.    Baseline (mod cues 10/5)    Time 2    Period Weeks    Status On-going  Plan - 11/27/19 1301    Clinical Impression Statement [name] presents with cognitive communication impairments s/p multiple CVAs, a decline from mild-moderate deficits when last seen at our clinic in 2019. Cognitive-linguistic testing (CLQT) completed and shows overall mild-moderate deficits; SLP feels level of deficit is functionally moderate-severe. Sustained/selective attention, memory, awareness, and language are impaired. Pt unlikely to make progress/achieve carryover without family involvement due to severity of deficits. Have encouraged wife to attend with pt at least every other session. I recommend trial course of ST for 4 weeks to determine if pt can make gains or benefit from pt/family compensation training.    Speech Therapy Frequency 2x / week    Duration 4 weeks   or 9 total visits   Treatment/Interventions SLP instruction and feedback;Functional tasks;Cognitive  reorganization;Compensatory strategies;Patient/family education;Multimodal communcation approach;Internal/external aids    Potential to Achieve Goals Fair    Potential Considerations Ability to learn/carryover information;Cooperation/participation level;Medical prognosis;Severity of impairments;Previous level of function    Consulted and Agree with Plan of Care Patient;Family member/caregiver    Family Member Consulted spouse           Patient will benefit from skilled therapeutic intervention in order to improve the following deficits and impairments:   Cognitive communication deficit  Aphasia    Problem List Patient Active Problem List   Diagnosis Date Noted  . Labile blood glucose   . 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 OSA (obstructive sleep apnea)   . Controlled type 2 diabetes mellitus with hyperglycemia, without long-term current use of insulin ([place])   . CVA (cerebral vascular accident) ([place]) 07/16/2019  . Dyslipidemia   . Benign essential HTN   . Diabetes mellitus type 2 in obese ([place])   . Major depressive disorder, recurrent episode, moderate ([place])   . Gait disorder   . Acute right MCA stroke ([place]) 06/28/2019  . Ambulatory dysfunction   . Stroke ([place]) 06/24/2019  . Type 2 diabetes mellitus ([place]) 10/04/2018  . Depression 10/04/2018  . Acute CVA (cerebrovascular accident) ([place]) 09/18/2017  . Stroke (cerebrum) ([place]) 09/17/2017  . Diabetes mellitus without complication ([place]) 59/56/3875  . Closed head injury 04/22/2013  . HLD (hyperlipidemia) 07/29/2009  . Essential hypertension 07/29/2009  . EFFUSION, PLEURAL 07/29/2009  . DIVERTICULITIS, COLON 07/29/2009  . 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 COLONIC POLYPS, HYPERPLASTIC, HX OF 07/29/2009   Deneise Lever, [place], CCC-SLP Speech-Language Pathologist  Aliene Altes 11/27/2019, 1:05 PM  [facility] [address] [place], [place],  [address] Phone: [phone]   Fax:  [phone]   Name: Justin [name] MRN: [id number] Date of Birth: 28-Jun-1940

## 2019-11-29 ENCOUNTER — Ambulatory Visit: Payer: Medicare HMO

## 2019-11-29 ENCOUNTER — Ambulatory Visit: Payer: Medicare HMO | Admitting: Occupational Therapy

## 2019-11-29 ENCOUNTER — Other Ambulatory Visit: Payer: Self-pay

## 2019-11-29 ENCOUNTER — Encounter: Payer: Self-pay | Admitting: Occupational Therapy

## 2019-11-29 DIAGNOSIS — R2681 Unsteadiness on feet: Secondary | ICD-10-CM | POA: Diagnosis not present

## 2019-11-29 DIAGNOSIS — R2689 Other abnormalities of gait and mobility: Secondary | ICD-10-CM

## 2019-11-29 DIAGNOSIS — M6281 Muscle weakness (generalized): Secondary | ICD-10-CM

## 2019-11-29 DIAGNOSIS — R41840 Attention and concentration deficit: Secondary | ICD-10-CM

## 2019-11-29 NOTE — Therapy (Signed)
[facility] [address] [place], [place], [address] Phone: [phone]   Fax:  [phone]  Physical Therapy Treatment  Patient Details  Name: Justin [name] MRN: [id number] Date of Birth: Mar 14, 1940 Referring Provider (PT): Antony Contras, MD   Encounter Date: 11/29/2019   PT End of Session - 11/29/19 1238    Visit Number 9    Number of Visits 17    Date for PT Re-Evaluation 01/07/20   POC for 8 weeks, Cert for 90 days   Authorization Type [employer] (Auth Required)    Progress Note Due on Visit 10    PT Start Time 1230    PT Stop Time 1313    PT Time Calculation (min) 43 min    Equipment Utilized During Treatment Gait belt    Activity Tolerance Patient tolerated treatment well   easily distractable   Behavior During Therapy WFL for tasks assessed/performed           Past Medical History:  Diagnosis Date  . Brain bleed ([place])   . Cancer [facility])    prostate  . 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 Depression   . Diabetes mellitus without complication ([place])   . High cholesterol   . Hypertension   . Stroke [facility])     Past Surgical History:  Procedure Laterality Date  . arm & hand surgery Left 1972   skill saw accident  . COLON SURGERY     partial  . EYE SURGERY     lens implant and cateract- Left  . PROSTATECTOMY      There were no vitals filed for this visit.   Subjective Assessment - 11/29/19 1237    Subjective No new changes/complaints since last visit. No falls to report. Patient reports he did break a tooth due to eating peanuts.    Patient is accompained by: Family member   Wife   Pertinent History history of CVA, HTN, hypercholesterolemia, diabetes, depression, bursitis in R hip, and intracranial hemorrhage.    Limitations Walking;Standing    How long can you walk comfortably? 10-15 minutes    Patient Stated Goals Patient wants to walk straight, get back to doing what was before     Currently in Pain? No/denies                             [facility] Adult PT Treatment/Exercise - 11/29/19 0001      Ambulation/Gait   Ambulation/Gait Yes    Ambulation/Gait Assistance 4: Min guard    Ambulation/Gait Assistance Details completed ambulation into gym x 100 ft with RW and CGA from PT. Completed gait training 2 x 115 ft around therapy gym, continued verbal cues for improved step length with RLE. Increased dragging of RLE with distraction in therapy gym , but patient does demo some improvement since prior sessions.      Ambulation Distance (Feet) 100 Feet    Assistive device Rolling walker    Gait Pattern Step-through pattern;Decreased arm swing - right;Decreased arm swing - left;Decreased step length - right;Decreased stance time - right;Decreased dorsiflexion - right;Decreased hip/knee flexion - right;Poor foot clearance - right    Ambulation Surface Level;Indoor      Exercises   Exercises Knee/Hip    Other Exercises  Due to patient reports of increased spasms/tightness in R gastroc, completed gastroc stretch and added to HEP      Knee/Hip Exercises:  Stretches   Press photographer Both;2 reps;30 seconds    Gastroc Stretch Limitations completed with belt, verbal cues for technique.      Knee/Hip Exercises: Aerobic   Nustep --    Other Aerobic Completed SciFit w/ BLE/BUE x 8 minutes to promote improved endurance/BLE strengthening, verbal cues to keep RPM > 55, increased verbal cues for pace required with distraction.            Completed all of the exercises below as a review of entire HEP to ensure proper completion and compliance. PT utilizing teach back method to ensure learning of HEP. Patient demo improved completion of exercises, but did require verbal cues for attention to task and verbal cues for slowed pace to promote proper form.    Access Code: [email or web address] URL: [email or web address] Date: 11/29/2019 Prepared by: Baldomero Lamy  Exercises Sit to Stand with Armchair - 2 x daily - 5 x weekly - 2 sets - 5 reps Seated March with Resistance - 2 x daily - 5 x weekly - 2 sets - 10 reps Seated Hamstring Curl with Anchored Resistance - 2 x daily - 5 x weekly - 2 sets - 10 reps - Completed with greenband.  Seated Heel Toe Raises - 2 x daily - 5 x weekly - 2 sets - 10 reps   New Addition to HEP:  Seated Gastroc Stretch with Strap - 1 x daily - 5 x weekly - 1 sets - 3 reps - 30 hold        PT Short Term Goals - 11/06/19 1200      PT SHORT TERM GOAL #1   Title Patient will be independent with HEP for strengthening/balance with wife superivison (ALL STGs Due: 11/06/19)    Baseline wife reports only doing seated exercies, every other day    Time 4    Period Weeks    Status Not Met    Target Date 11/06/19      PT SHORT TERM GOAL #2   Title Pt will improve 5x sit<>stand to less than or equal to 25 seconds for improved transfer efficiency and safety.    Baseline 31.34 secs, 29.84 seconds w/ UE support    Time 4    Period Weeks    Status Not Met      PT SHORT TERM GOAL #3   Title Pt will improve TUG score to less than or equal to 20 seconds for decreased fall risk.    Baseline 31.57 secs w/ RW, 25.63 secs w/ RW    Time 4    Period Weeks    Status Not Met      PT SHORT TERM GOAL #4   Title Berg Balance to be Assessed and LTG to be set as appropriate    Baseline 36/56 on 10/16/19    Time 4    Period Weeks    Status Achieved      PT SHORT TERM GOAL #5   Title Patient will demonstrate ability to ambulate >300 ft with LRAD and CGA for improved household mobility    Baseline 275 ft w/ CGA, max verbal cues    Time 4    Period Weeks    Status Partially Met             PT Long Term Goals - 10/16/19 1552      PT LONG TERM GOAL #1   Title Patient will be independent with final HEP for improved strength and balance (ALL LTGs Due: 12/04/19)  Baseline no HEP established    Time 8    Period Weeks     Status New      PT LONG TERM GOAL #2   Title Pt will improve gait velocity to at least 2.0 ft/sec for decreased fall risk, improved gait efficiency.    Time 8    Period Weeks    Status New      PT LONG TERM GOAL #3   Title Pt will improve BERG to at least a 41/56 to demo decr fall risk.    Baseline 36/56    Time 8    Period Weeks    Status Revised      PT LONG TERM GOAL #4   Title Patient will improve TUG score to <15 seconds to demosntrate improved mobility and reduced fall risk    Baseline 31.57 secs    Time 8    Period Weeks    Status New      PT LONG TERM GOAL #5   Title Patient will improved 5x sit <> stand to </= 15 seconds to demonstrate improved functional mobility    Baseline 31.34 secs    Time 8    Period Weeks    Status New      PT LONG TERM GOAL #6   Title Patient and wife will report understanding of fall prevention within the home    Baseline currently dependent    Time 8    Period Weeks    Status New                 Plan - 11/29/19 1241    Clinical Impression Statement Today's skilled session included continued gait training to promote improved step length on RLE, with max verbal/tactile cues from PT. Reviewed entire HEP with patient to ensure compliance and proper completion. Patient tolerating activites well throughout session. Will continue to progress toward all LTGs.    Personal Factors and Comorbidities Behavior Pattern;Comorbidity 3+    Comorbidities history of CVA, HTN, hypercholesterolemia, diabetes, depression, bursitis in R hip, and intracranial hemorrhage.    Examination-Activity Limitations Bed Mobility;Sit;Transfers;Stand;Toileting;Bathing;Dressing    Examination-Participation Restrictions [employer] Activity    Stability/Clinical Decision Making Evolving/Moderate complexity    Rehab Potential Fair    PT Frequency 2x / week    PT Duration 8 weeks    PT Treatment/Interventions ADLs/Self Care Home  Management;Cryotherapy;Electrical Stimulation;Moist Heat;DME Instruction;Gait training;Functional mobility Scientist, forensic;Therapeutic activities;Therapeutic exercise;Neuromuscular re-education;Balance training;Patient/family education;Orthotic Fit/Training;Manual techniques;Passive range of motion    PT Next Visit Plan tru scifit/again. seated/standing balance and strengthening, sit <> stand training, gait (maybe try colored bands for target). Complete session in therapy room if able for improved participation.    Consulted and Agree with Plan of Care Patient;Family member/caregiver    Family Member Consulted Wife           Patient will benefit from skilled therapeutic intervention in order to improve the following deficits and impairments:  Abnormal gait, Decreased endurance, Decreased balance, Decreased mobility, Difficulty walking, Hypomobility, Postural dysfunction, Pain, Decreased strength, Decreased safety awareness, Decreased knowledge of use of DME, Decreased coordination, Decreased activity tolerance, Decreased cognition, Decreased range of motion  Visit Diagnosis: Unsteadiness on feet  Muscle weakness (generalized)  Other abnormalities of gait and mobility     Problem List Patient Active Problem List   Diagnosis Date Noted  . Labile blood glucose   . 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 OSA (obstructive sleep apnea)   . Controlled  type 2 diabetes mellitus with hyperglycemia, without long-term current use of insulin ([place])   . CVA (cerebral vascular accident) ([place]) 07/16/2019  . Dyslipidemia   . Benign essential HTN   . Diabetes mellitus type 2 in obese ([place])   . Major depressive disorder, recurrent episode, moderate ([place])   . Gait disorder   . Acute right MCA stroke ([place]) 06/28/2019  . Ambulatory dysfunction   . Stroke ([place]) 06/24/2019  . Type 2 diabetes mellitus ([place]) 10/04/2018  . Depression 10/04/2018  . Acute CVA (cerebrovascular  accident) ([place]) 09/18/2017  . Stroke (cerebrum) ([place]) 09/17/2017  . Diabetes mellitus without complication ([place]) 23/30/0762  . Closed head injury 04/22/2013  . HLD (hyperlipidemia) 07/29/2009  . Essential hypertension 07/29/2009  . EFFUSION, PLEURAL 07/29/2009  . DIVERTICULITIS, COLON 07/29/2009  . 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 COLONIC POLYPS, HYPERPLASTIC, HX OF 07/29/2009    Jones Bales, PT, DPT 11/29/2019, 1:27 PM  [facility] [address] [place], [place], [address] Phone: [phone]   Fax:  [phone]  Name: TAYQUAN GASSMAN MRN: [id number] Date of Birth: 1940/09/28

## 2019-11-29 NOTE — Therapy (Signed)
[facility] [address] [place], [place], [address] Phone: [phone]   Fax:  [phone]  Occupational Therapy Treatment  Patient Details  Name: Justin [name] MRN: [id number] Date of Birth: 1940/11/16 No data recorded  Encounter Date: 11/29/2019   OT End of Session - 11/29/19 1521    Visit Number 4    Number of Visits 13    Authorization Type [employer]    OT Start Time 1315    OT Stop Time 1400    OT Time Calculation (min) 45 min           Past Medical History:  Diagnosis Date  . Brain bleed ([place])   . Cancer [facility])    prostate  . 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 Depression   . Diabetes mellitus without complication ([place])   . High cholesterol   . Hypertension   . Stroke [facility])     Past Surgical History:  Procedure Laterality Date  . arm & hand surgery Left 1972   skill saw accident  . COLON SURGERY     partial  . EYE SURGERY     lens implant and cateract- Left  . PROSTATECTOMY      There were no vitals filed for this visit.   Subjective Assessment - 11/29/19 1516    Subjective  Patient feels his walking is improving    Currently in Pain? No/denies    Pain Score 0-No pain                        OT Treatments/Exercises (OP) - 11/29/19 0001      Neurological Re-education Exercises   Other Exercises 1 Worked on componenets of sit to stand to improve stand balance.  Cued patient to shift forward in seat, bring feet and knees closer together, and lean forward to stand.  Patient without backward loss of balance with overt cueing prior.  Worked on stand balance - use of RUE on vertical surface to play tic tac toe - addressing attention and sequencing as well as addressing balance and functional use of RUE.  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                    OT  Education - 11/29/19 1520    Education Details Right foot in front of left when walking    Person(s) Educated Patient;Spouse    Methods Explanation;Demonstration;Tactile cues;Verbal cues    Comprehension Need further instruction            OT Short Term Goals - 11/29/19 1523      OT SHORT TERM GOAL #1   Title Patient and wife will compile a list of activities patient can safely participate in at home - e.g. making sandwich, picking out clothing, etc. due 12/21/19    Time 4    Period Weeks    Status On-going    Target Date 12/21/19      OT SHORT TERM GOAL #2   Title Patient will demonstrate ability to attend for 5 min or more to a familiar activity  in minimally distracting environment    Time 4    Period Weeks    Status Achieved      OT SHORT TERM GOAL #3   Title Patient will complete a home exercise program  to address upper body active movement and general strength with min cueing and set up    Time 4    Period Weeks    Status On-going             OT Long Term Goals - 11/29/19 1524      OT LONG TERM GOAL #1   Title Patient will complete a home activities rogram designed to help him safely engage in IADL with minimal prompting    Time 6    Period Weeks    Status On-going      OT LONG TERM GOAL #2   Title Patient will demonstrate sufficient standing balance to stand at counter top to complete a familiar functional task - eg prepare a snack    Time 6    Period Weeks    Status On-going      OT LONG TERM GOAL #3   Title Patient will follow a daily schedule to outline sleep and wake times, meal times, and activity times to reduce amount of idol TV watching time during day.    Time 6    Period Weeks    Status On-going                 Plan - 11/29/19 1521    Clinical Impression Statement Patient has shown improved attentional skills, and can remain focused on novel task in busy gym environment.  Patient needs additional work on functinal mobility - turning,  backing up, etc - and coordiantion with BLE's.    OT Occupational Profile and History Detailed Assessment- Review of Records and additional review of physical, cognitive, psychosocial history related to current functional performance    Occupational performance deficits (Please refer to evaluation for details): ADL's;IADL's    Body Structure / Function / Physical Skills ADL;Coordination;Endurance;GMC;Balance;Decreased knowledge of precautions;Sensation;IADL;Flexibility;Decreased knowledge of use of DME;Body mechanics;Cardiopulmonary status limiting activity;Dexterity;FMC;Strength    Cognitive Skills Attention;Energy/Drive;Memory;Safety Awareness;Problem Solve;Perception;Orientation;Sequencing    Rehab Potential Fair    Clinical Decision Making Limited treatment options, no task modification necessary    Comorbidities Affecting Occupational Performance: May have comorbidities impacting occupational performance    Modification or Assistance to Complete Evaluation  Min-Moderate modification of tasks or assist with assess necessary to complete eval    OT Frequency 2x / week    OT Duration 6 weeks    OT Treatment/Interventions Self-care/ADL training;Therapeutic exercise;Neuromuscular education;Patient/family education;Balance training;Therapeutic activities;Functional Mobility Training;Cognitive remediation/compensation;DME and/or AE instruction    Plan Need to work on selective attention while standing or walking, turning, backing up- e.g.  making grilled cheese sandwich, NMR RUE/LE, could use RUE coord HEP    Consulted and Agree with Plan of Care Patient;Family member/caregiver    Family Member Consulted [name]           Patient will benefit from skilled therapeutic intervention in order to improve the following deficits and impairments:   Body Structure / Function / Physical Skills: ADL, Coordination, Endurance, GMC, Balance, Decreased knowledge of precautions, Sensation, IADL, Flexibility,  Decreased knowledge of use of DME, Body mechanics, Cardiopulmonary status limiting activity, Dexterity, FMC, Strength Cognitive Skills: Attention, Energy/Drive, Memory, Safety Awareness, Problem Solve, Perception, Orientation, Sequencing     Visit Diagnosis: Unsteadiness on feet  Muscle weakness (generalized)  Attention and concentration deficit    Problem List Patient Active Problem List   Diagnosis Date Noted  . Labile blood glucose   . 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 OSA (obstructive sleep apnea)   .  Controlled type 2 diabetes mellitus with hyperglycemia, without long-term current use of insulin ([place])   . CVA (cerebral vascular accident) ([place]) 07/16/2019  . Dyslipidemia   . Benign essential HTN   . Diabetes mellitus type 2 in obese ([place])   . Major depressive disorder, recurrent episode, moderate ([place])   . Gait disorder   . Acute right MCA stroke ([place]) 06/28/2019  . Ambulatory dysfunction   . Stroke ([place]) 06/24/2019  . Type 2 diabetes mellitus ([place]) 10/04/2018  . Depression 10/04/2018  . Acute CVA (cerebrovascular accident) ([place]) 09/18/2017  . Stroke (cerebrum) ([place]) 09/17/2017  . Diabetes mellitus without complication ([place]) 79/89/2119  . Closed head injury 04/22/2013  . HLD (hyperlipidemia) 07/29/2009  . Essential hypertension 07/29/2009  . EFFUSION, PLEURAL 07/29/2009  . DIVERTICULITIS, COLON 07/29/2009  . 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 COLONIC POLYPS, HYPERPLASTIC, HX OF 07/29/2009    Mariah Milling, OTR/L 11/29/2019, 3:24 PM  [facility] [address] [place], [place], [address] Phone: [phone]   Fax:  [phone]  Name: JAMMY PLOTKIN MRN: [id number] Date of Birth: [birth year]

## 2019-12-04 ENCOUNTER — Ambulatory Visit: Payer: Medicare HMO

## 2019-12-04 ENCOUNTER — Other Ambulatory Visit: Payer: Self-pay

## 2019-12-04 ENCOUNTER — Ambulatory Visit: Payer: Medicare HMO | Admitting: Occupational Therapy

## 2019-12-04 DIAGNOSIS — R2689 Other abnormalities of gait and mobility: Secondary | ICD-10-CM

## 2019-12-04 DIAGNOSIS — R41841 Cognitive communication deficit: Secondary | ICD-10-CM

## 2019-12-04 DIAGNOSIS — R2681 Unsteadiness on feet: Secondary | ICD-10-CM

## 2019-12-04 DIAGNOSIS — R41840 Attention and concentration deficit: Secondary | ICD-10-CM

## 2019-12-04 DIAGNOSIS — R4701 Aphasia: Secondary | ICD-10-CM

## 2019-12-04 DIAGNOSIS — M6281 Muscle weakness (generalized): Secondary | ICD-10-CM

## 2019-12-04 NOTE — Therapy (Addendum)
Justin [name]. [place], [place], [address] Phone: [phone]   Fax:  [phone]  Speech Language Pathology Treatment  Patient Details  Name: Justin [name] MRN: [id number] Date of Birth: May 10, 1940 Referring Provider (SLP): Dr. Leonie [name]   Encounter Date: 12/04/2019   End of Session - 12/04/19 1311    Visit Number 5    Number of Visits 9    Date for SLP Re-Evaluation 01/06/20    SLP Start Time 1151    SLP Stop Time  1231    SLP Time Calculation (min) 40 min    Activity Tolerance Patient tolerated treatment well           Past Medical History:  Diagnosis Date  . Brain bleed (Justin [name])   . Cancer [name])    prostate  . 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 Depression   . Diabetes mellitus without complication (Justin [name])   . High cholesterol   . Hypertension   . Stroke [name])     Past Surgical History:  Procedure Laterality Date  . arm & hand surgery Left 1972   skill saw accident  . COLON SURGERY     partial  . EYE SURGERY     lens implant and cateract- Left  . PROSTATECTOMY      There were no vitals filed for this visit.   Subjective Assessment - 12/04/19 1202    Subjective Pt immediately retrieved notebook from his walker bag when SLP hinted at "a book to help keep things straight"    Patient is accompained by: --   pt alone today for tx   Currently in Pain? No/denies                 ADULT SLP TREATMENT - 12/04/19 1215      General Information   Behavior/Cognition Alert;Distractible;Requires cueing      Treatment Provided   Treatment provided Cognitive-Linquistic      Cognitive-Linquistic Treatment   Treatment focused on Cognition    Skilled Treatment Arrived alone today to ST. Pt retrieved memory notebook with min cue ("a book to help keep things straight"). Pt turned immediately to ST homework without any explanation from pt, but req'd mod A usually to tell SLP what else  was in the notebook. SLP engaged pt in conversation to work on sustained and selective attention. Pt repeatedly looked out window to rt in first 15 minutes despite cues from SLP. After 15 minutes SLP closed blinds and pt did not look out window again. He engaged with SLP in 4 conversations, two of which made it to 5 minutes in length spontaneously - the longest was for 9 minutes about directions to his new home from his old home, using Google maps as visual cue. Pt was provided homework of responsive naming for working on sustained/selective attention.       Assessment / Recommendations / Plan   Plan Continue with current plan of care      Progression Toward Goals   Progression toward goals Progressing toward goals                SLP Long Term Goals - 12/04/19 1156      SLP LONG TERM GOAL #1   Title Pt will sustain attention to simple cognitive-linguistic tasks 5 minutes x 3 per session, over 2 visits.    Time 1    Period --    Status On-going  SLP LONG TERM GOAL #2   Title Pt will access/retrieve schedule or appointment information from memory system with min cues from spouse, x 3 sessions.    Time 1    Period Weeks    Status On-going      SLP LONG TERM GOAL #3   Title Pt will participate in 5 minute simple conversation with supports/compensations x 3 visits.    Baseline (mod cues 10/5), 12-04-19    Time 1    Period Weeks    Status On-going            Plan - 12/04/19 1312    Clinical Impression Statement Justin [name]. Justin [name]") presents with cognitive communication impairments s/p multiple CVAs, a decline from mild-moderate deficits when last seen at our clinic in 2019. Cognitive-linguistic testing (CLQT) completed and shows overall mild-moderate deficits; SLP feels level of deficit is functionally moderate-severe. Sustained/selective attention, memory, awareness, and language are impaired. Pt unlikely to make progress/achieve carryover without family involvement due to  severity of deficits. Have encouraged wife to attend with pt at least every other session. I recommend trial course of ST for 4 weeks to determine if pt can make gains or benefit from pt/family compensation training.    Speech Therapy Frequency 2x / week    Duration 4 weeks   or 9 total visits   Treatment/Interventions SLP instruction and feedback;Functional tasks;Cognitive reorganization;Compensatory strategies;Patient/family education;Multimodal communcation approach;Internal/external aids    Potential to Achieve Goals Fair    Potential Considerations Ability to learn/carryover information;Cooperation/participation level;Medical prognosis;Severity of impairments;Previous level of function    Consulted and Agree with Plan of Care Patient;Family member/caregiver    Family Member Consulted spouse           Patient will benefit from skilled therapeutic intervention in order to improve the following deficits and impairments:   Cognitive communication deficit  Aphasia    Problem List Patient Active Problem List   Diagnosis Date Noted  . Labile blood glucose   . Left middle cerebral artery stroke (Justin [name]) 07/18/2019  . OSA (obstructive sleep apnea)   . Controlled type 2 diabetes mellitus with hyperglycemia, without long-term current use of insulin (Justin [name])   . CVA (cerebral vascular accident) (Justin [name]) 07/16/2019  . Dyslipidemia   . Benign essential HTN   . Diabetes mellitus type 2 in obese (Justin [name])   . Major depressive disorder, recurrent episode, moderate (Justin [name])   . Gait disorder   . Acute right MCA stroke (Justin [name]) 06/28/2019  . Ambulatory dysfunction   . Stroke (Justin [name]) 06/24/2019  . Type 2 diabetes mellitus (Justin [name]) 10/04/2018  . Depression 10/04/2018  . Acute CVA (cerebrovascular accident) (Justin [name]) 09/18/2017  . Stroke (cerebrum) (Justin [name]) 09/17/2017  . Diabetes mellitus without complication (Justin [name]) 63/33/5456  . Closed head injury 04/22/2013  . HLD (hyperlipidemia) 07/29/2009  . Essential hypertension  07/29/2009  . EFFUSION, PLEURAL 07/29/2009  . DIVERTICULITIS, COLON 07/29/2009  . 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 COLONIC POLYPS, HYPERPLASTIC, HX OF 07/29/2009    [name] ,MS, CCC-SLP  12/04/2019, 1:14 PM  [facility] [address] [place], [place], [address] Phone: [phone]   Fax:  [phone]   Name: Justin [name] MRN: [id number] Date of Birth: 11-Oct-1940

## 2019-12-04 NOTE — Therapy (Signed)
[facility] [address] [place], [place], [address] Phone: [phone]   Fax:  [phone]  Physical Therapy Treatment/Progress Note  Patient Details  Name: Justin [name] MRN: [id number] Date of Birth: Nov 13, 1940 Referring Provider (PT): Antony Contras, MD  Physical Therapy Progress Note   Dates of Reporting Period: 10/09/2019 - 12/04/2019  See Note below for Objective Data and Assessment of Progress/Goals.  Thank you for the referral of this patient. Guill[place]ina [place], PT, DPT   Encounter Date: 12/04/2019   PT End of Session - 12/04/19 1347    Visit Number 10    Number of Visits 17    Date for PT Re-Evaluation 01/07/20   POC for 8 weeks, Cert for 90 days   Authorization Type [employer] (Auth Required)    Progress Note Due on Visit 10    PT Start Time 1233   finishing up with Speech Therapy   PT Stop Time 1314    PT Time Calculation (min) 41 min    Equipment Utilized During Treatment Gait belt    Activity Tolerance Patient tolerated treatment well   easily distractable   Behavior During Therapy WFL for tasks assessed/performed           Past Medical History:  Diagnosis Date  . Brain bleed ([place])   . Cancer [facility])    prostate  . 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 Depression   . Diabetes mellitus without complication ([place])   . High cholesterol   . Hypertension   . Stroke [facility])     Past Surgical History:  Procedure Laterality Date  . arm & hand surgery Left 1972   skill saw accident  . COLON SURGERY     partial  . EYE SURGERY     lens implant and cateract- Left  . PROSTATECTOMY      There were no vitals filed for this visit.       [facility] Adult PT Treatment/Exercise - 12/04/19 0001      Ambulation/Gait   Ambulation/Gait Yes    Ambulation/Gait Assistance 4: Min guard    Ambulation/Gait Assistance Details completd ambulation into/out of therapy session, increased verbal cues for  improved step length throughout ambulation.     Ambulation Distance (Feet) 75 Feet   x 2   Assistive device Rolling walker    Gait Pattern Step-through pattern;Decreased arm swing - right;Decreased arm swing - left;Decreased step length - right;Decreased stance time - right;Decreased dorsiflexion - right;Decreased hip/knee flexion - right;Poor foot clearance - right    Ambulation Surface Level;Indoor      High Level Balance   High Level Balance Activities Side stepping;Marching forwards    High Level Balance Comments in // bars completed side stepping down and back x 3 laps. increased verbal cues for step length on RLE with leading and avoiding dragging RLE with it trailing. Completed forward marching down and back x 3 laps in // bars, verbal cues for improved hip/knee flexion on RLE.       Neuro Re-ed    Neuro Re-ed Details  Standing in // bars with single UE support, completed alternating steps forward /backward over black balance beam x 15 reps each. Increased challenge noted with clearance on RLE, often hitting black balance beam. Increased verbal cues required for proper completion.       Exercises   Exercises Knee/Hip      Knee/Hip Exercises: Aerobic   Other Aerobic Completed SciFit w/  BLE/BUE x 6 minutes at Level 2.0 to promote improved endurance/BLE strengthening, verbal cues to keep RPM > 55, increased verbal cues for pace required with distraction.       Knee/Hip Exercises: Standing   Forward Step Up Both;1 set;15 reps;Step Height: 6";Hand Hold: 2    Forward Step Up Limitations completing alternating step ups x 15 reps bilaterally with BUE support from // bars, alternating feet require verbal cues for posture and sequencing.                Balance Exercises - 12/04/19 0001      Balance Exercises: Standing   Standing Eyes Opened Foam/compliant surface;Limitations    Standing Eyes Opened Limitations feet shoulder width apart, completed horizontal/vertical head turns 2 x 10  reps each. increased verbal cues    Standing Eyes Closed Narrow base of support (BOS);Foam/compliant surface;3 reps;30 secs;Limitations    Standing Eyes Closed Limitations increased sway noted with vision removed requiring intermittent UE suppot and CGA from PT.                PT Short Term Goals - 11/06/19 1200      PT SHORT TERM GOAL #1   Title Patient will be independent with HEP for strengthening/balance with wife superivison (ALL STGs Due: 11/06/19)    Baseline wife reports only doing seated exercies, every other day    Time 4    Period Weeks    Status Not Met    Target Date 11/06/19      PT SHORT TERM GOAL #2   Title Pt will improve 5x sit<>stand to less than or equal to 25 seconds for improved transfer efficiency and safety.    Baseline 31.34 secs, 29.84 seconds w/ UE support    Time 4    Period Weeks    Status Not Met      PT SHORT TERM GOAL #3   Title Pt will improve TUG score to less than or equal to 20 seconds for decreased fall risk.    Baseline 31.57 secs w/ RW, 25.63 secs w/ RW    Time 4    Period Weeks    Status Not Met      PT SHORT TERM GOAL #4   Title Berg Balance to be Assessed and LTG to be set as appropriate    Baseline 36/56 on 10/16/19    Time 4    Period Weeks    Status Achieved      PT SHORT TERM GOAL #5   Title Patient will demonstrate ability to ambulate >300 ft with LRAD and CGA for improved household mobility    Baseline 275 ft w/ CGA, max verbal cues    Time 4    Period Weeks    Status Partially Met             PT Long Term Goals - 10/16/19 1552      PT LONG TERM GOAL #1   Title Patient will be independent with final HEP for improved strength and balance (ALL LTGs Due: 12/04/19)    Baseline no HEP established    Time 8    Period Weeks    Status New      PT LONG TERM GOAL #2   Title Pt will improve gait velocity to at least 2.0 ft/sec for decreased fall risk, improved gait efficiency.    Time 8    Period Weeks    Status New       PT LONG  TERM GOAL #3   Title Pt will improve BERG to at least a 41/56 to demo decr fall risk.    Baseline 36/56    Time 8    Period Weeks    Status Revised      PT LONG TERM GOAL #4   Title Patient will improve TUG score to <15 seconds to demosntrate improved mobility and reduced fall risk    Baseline 31.57 secs    Time 8    Period Weeks    Status New      PT LONG TERM GOAL #5   Title Patient will improved 5x sit <> stand to </= 15 seconds to demonstrate improved functional mobility    Baseline 31.34 secs    Time 8    Period Weeks    Status New      PT LONG TERM GOAL #6   Title Patient and wife will report understanding of fall prevention within the home    Baseline currently dependent    Time 8    Period Weeks    Status New                 Plan - 12/04/19 1348    Clinical Impression Statement Today's skilled PT session included continued gait trianing and balance exercises. Patient contineus to demonstrate improvements in ambulation still require intermittent verbal cues to avoid dragging of RLE. Patient able to complete session with minimal rest breaks demonstrating improved activity tolerance. patient is demonstrating progress with PT services and will continue to benefit from skilled therapy to further maximize functional mobility.    Personal Factors and Comorbidities Behavior Pattern;Comorbidity 3+    Comorbidities history of CVA, HTN, hypercholesterolemia, diabetes, depression, bursitis in R hip, and intracranial hemorrhage.    Examination-Activity Limitations Bed Mobility;Sit;Transfers;Stand;Toileting;Bathing;Dressing    Examination-Participation Restrictions [employer] Activity    Stability/Clinical Decision Making Evolving/Moderate complexity    Rehab Potential Fair    PT Frequency 2x / week    PT Duration 8 weeks    PT Treatment/Interventions ADLs/Self Care Home Management;Cryotherapy;Electrical Stimulation;Moist Heat;DME Instruction;Gait  training;Functional mobility Scientist, forensic;Therapeutic activities;Therapeutic exercise;Neuromuscular re-education;Balance training;Patient/family education;Orthotic Fit/Training;Manual techniques;Passive range of motion    PT Next Visit Plan continue scifit for endurance. seated/standing balance and strengthening, sit <> stand training, gait (maybe try colored bands for target). Complete session in therapy room if able for improved participation.    Consulted and Agree with Plan of Care Patient;Family member/caregiver    Family Member Consulted Wife           Patient will benefit from skilled therapeutic intervention in order to improve the following deficits and impairments:  Abnormal gait, Decreased endurance, Decreased balance, Decreased mobility, Difficulty walking, Hypomobility, Postural dysfunction, Pain, Decreased strength, Decreased safety awareness, Decreased knowledge of use of DME, Decreased coordination, Decreased activity tolerance, Decreased cognition, Decreased range of motion  Visit Diagnosis: Unsteadiness on feet  Muscle weakness (generalized)  Other abnormalities of gait and mobility     Problem List Patient Active Problem List   Diagnosis Date Noted  . Labile blood glucose   . 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 OSA (obstructive sleep apnea)   . Controlled type 2 diabetes mellitus with hyperglycemia, without long-term current use of insulin ([place])   . CVA (cerebral vascular accident) ([place]) 07/16/2019  . Dyslipidemia   . Benign essential HTN   . Diabetes mellitus type 2 in obese ([place])   . Major depressive disorder, recurrent episode, moderate ([place])   .  Gait disorder   . Acute right MCA stroke ([place]) 06/28/2019  . Ambulatory dysfunction   . Stroke ([place]) 06/24/2019  . Type 2 diabetes mellitus ([place]) 10/04/2018  . Depression 10/04/2018  . Acute CVA (cerebrovascular accident) ([place]) 09/18/2017  . Stroke (cerebrum) ([place]) 09/17/2017  .  Diabetes mellitus without complication ([place]) 86/14/8307  . Closed head injury 04/22/2013  . HLD (hyperlipidemia) 07/29/2009  . Essential hypertension 07/29/2009  . EFFUSION, PLEURAL 07/29/2009  . DIVERTICULITIS, COLON 07/29/2009  . 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 COLONIC POLYPS, HYPERPLASTIC, HX OF 07/29/2009    Jones Bales, PT, DPT 12/04/2019, 1:51 PM  [facility] [address] [place], [place], [address] Phone: [phone]   Fax:  [phone]  Name: CHAYCE ROBBINS MRN: [id number] Date of Birth: December 31, 1940

## 2019-12-04 NOTE — Therapy (Signed)
[facility] [address] [place], [place], [address] Phone: [phone]   Fax:  [phone]  Occupational Therapy Treatment  Patient Details  Name: Justin [name] MRN: [id number] Date of Birth: [birth year] No data recorded  Encounter Date: 12/04/2019   OT End of Session - 12/04/19 1313    Visit Number 5    Number of Visits 13    Authorization Type [employer]    OT Start Time 1315    OT Stop Time 1355    OT Time Calculation (min) 40 min    Activity Tolerance Patient tolerated treatment well    Behavior During Therapy [name] for tasks assessed/performed           Past Medical History:  Diagnosis Date  . Brain bleed (Justin [name])   . Cancer [name]    prostate  . 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 Depression   . Diabetes mellitus without complication (Justin [name])   . High cholesterol   . Hypertension   . Stroke [name]     Past Surgical History:  Procedure Laterality Date  . arm & hand surgery Left 1972   skill saw accident  . COLON SURGERY     partial  . EYE SURGERY     lens implant and cateract- Left  . PROSTATECTOMY      There were no vitals filed for this visit.   Subjective Assessment - 12/04/19 1350    Subjective  Denies pain    Currently in Pain? No/denies              Treatment: Ambulating while carrying on conversation and naming animals, mod v.c for larger step size with RUE, min-mod difficulty with gait when dividing attention. Copying small peg design with RUE, in a busy environment, pt only required min v.c for RUe functional use. He copied design correctly maintaining good attention to task.                  OT Education - 12/04/19 1358    Education Details coordination HEP, min v.c and demonstration    Person(s) Educated Patient    Methods Explanation;Demonstration;Tactile cues;Verbal cues    Comprehension Verbalized understanding;Returned demonstration;Verbal cues  required            OT Short Term Goals - 11/29/19 1523      OT SHORT TERM GOAL #1   Title Patient and wife will compile a list of activities patient can safely participate in at home - e.g. making sandwich, picking out clothing, etc. due 12/21/19    Time 4    Period Weeks    Status On-going    Target Date 12/21/19      OT SHORT TERM GOAL #2   Title Patient will demonstrate ability to attend for 5 min or more to a familiar activity  in minimally distracting environment    Time 4    Period Weeks    Status Achieved      OT SHORT TERM GOAL #3   Title Patient will complete a home exercise program to address upper body active movement and general strength with min cueing and set up    Time 4    Period Weeks    Status On-going             OT Long Term Goals - 11/29/19 1524      [name] #1   Title Patient will complete a home activities  rogram designed to help him safely engage in IADL with minimal prompting    Time 6    Period Weeks    Status On-going      OT LONG TERM GOAL #2   Title Patient will demonstrate sufficient standing balance to stand at counter top to complete a familiar functional task - eg prepare a snack    Time 6    Period Weeks    Status On-going      OT LONG TERM GOAL #3   Title Patient will follow a daily schedule to outline sleep and wake times, meal times, and activity times to reduce amount of idol TV watching time during day.    Time 6    Period Weeks    Status On-going                 Plan - 12/04/19 1353    Clinical Impression Statement Pt is progressing towards goals. He demonstrates improving attention in a mod distracting environment. Pt continues to have difficulty with ambulting while performing cognitive task/ or carrying on converstation. Pt requires v.c for larger staps with RLE.    OT Occupational Profile and History Detailed Assessment- Review of Records and additional review of physical, cognitive, psychosocial  history related to current functional performance    Occupational performance deficits (Please refer to evaluation for details): ADL's;IADL's    Body Structure / Function / Physical Skills ADL;Coordination;Endurance;GMC;Balance;Decreased knowledge of precautions;Sensation;IADL;Flexibility;Decreased knowledge of use of DME;Body mechanics;Cardiopulmonary status limiting activity;Dexterity;FMC;Strength    Cognitive Skills Attention;Energy/Drive;Memory;Safety Awareness;Problem Solve;Perception;Orientation;Sequencing    Rehab Potential Fair    Clinical Decision Making Limited treatment options, no task modification necessary    Comorbidities Affecting Occupational Performance: May have comorbidities impacting occupational performance    Modification or Assistance to Complete Evaluation  Min-Moderate modification of tasks or assist with assess necessary to complete eval    OT Frequency 2x / week    OT Duration 6 weeks    OT Treatment/Interventions Self-care/ADL training;Therapeutic exercise;Neuromuscular education;Patient/family education;Balance training;Therapeutic activities;Functional Mobility Training;Cognitive remediation/compensation;DME and/or AE instruction    Plan Need to work on selective attention while standing or walking, turning, backing up- e.g.  making grilled cheese sandwich, NMR RUE/LE,    Consulted and Agree with Plan of Care Patient    Family Member Consulted [name]           Patient will benefit from skilled therapeutic intervention in order to improve the following deficits and impairments:   Body Structure / Function / Physical Skills: ADL, Coordination, Endurance, GMC, Balance, Decreased knowledge of precautions, Sensation, IADL, Flexibility, Decreased knowledge of use of DME, Body mechanics, Cardiopulmonary status limiting activity, Dexterity, FMC, Strength Cognitive Skills: Attention, Energy/Drive, Memory, Safety Awareness, Problem Solve, Perception, Orientation,  Sequencing     Visit Diagnosis: Muscle weakness (generalized)  Attention and concentration deficit  Other abnormalities of gait and mobility  Unsteadiness on feet    Problem List Patient Active Problem List   Diagnosis Date Noted  . Labile blood glucose   . 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 OSA (obstructive sleep apnea)   . Controlled type 2 diabetes mellitus with hyperglycemia, without long-term current use of insulin ([place])   . CVA (cerebral vascular accident) ([place]) 07/16/2019  . Dyslipidemia   . Benign essential HTN   . Diabetes mellitus type 2 in obese ([place])   . Major depressive disorder, recurrent episode, moderate ([place])   . Gait disorder   . Acute right MCA stroke ([place])  06/28/2019  . Ambulatory dysfunction   . Stroke ([place]) 06/24/2019  . Type 2 diabetes mellitus ([place]) 10/04/2018  . Depression 10/04/2018  . Acute CVA (cerebrovascular accident) ([place]) 09/18/2017  . Stroke (cerebrum) ([place]) 09/17/2017  . Diabetes mellitus without complication ([place]) 15/40/0867  . Closed head injury 04/22/2013  . HLD (hyperlipidemia) 07/29/2009  . Essential hypertension 07/29/2009  . EFFUSION, PLEURAL 07/29/2009  . DIVERTICULITIS, COLON 07/29/2009  . 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 COLONIC POLYPS, HYPERPLASTIC, HX OF 07/29/2009    Anabia Weatherwax 12/04/2019, 2:14 PM  [facility] [address] [place], [place], [address] Phone: [phone]   Fax:  [phone]  Name: Justin [name] MRN: [id number] Date of Birth: 1940/05/14

## 2019-12-04 NOTE — Patient Instructions (Signed)
  Please complete the assigned speech therapy homework prior to your next session and return it to the speech therapist at your next visit.  

## 2019-12-04 NOTE — Patient Instructions (Signed)
  Coordination Activities  Perform the following activities for 20 minutes 1 times per day with right hand(s).   Rotate ball in fingertips (clockwise and counter-clockwise).  Toss ball between hands.  Flip cards 1 at a time as fast as you can.  Deal cards with your thumb (Hold deck in hand and push card off top with thumb).  Pick up coins and place in container or coin bank.  Pick up coins and stack.  Pick up coins one at a time until you get 5-10 in your hand, then move coins from palm to fingertips to place in container one at a time.

## 2019-12-06 ENCOUNTER — Encounter: Payer: Medicare HMO | Admitting: Occupational Therapy

## 2019-12-06 ENCOUNTER — Ambulatory Visit: Payer: Medicare HMO

## 2019-12-06 ENCOUNTER — Other Ambulatory Visit: Payer: Self-pay

## 2019-12-06 DIAGNOSIS — R41841 Cognitive communication deficit: Secondary | ICD-10-CM

## 2019-12-06 DIAGNOSIS — R2681 Unsteadiness on feet: Secondary | ICD-10-CM

## 2019-12-06 DIAGNOSIS — M6281 Muscle weakness (generalized): Secondary | ICD-10-CM

## 2019-12-06 DIAGNOSIS — R2689 Other abnormalities of gait and mobility: Secondary | ICD-10-CM

## 2019-12-06 NOTE — Therapy (Signed)
[facility] [address] [place], [place], [address] Phone: [phone]   Fax:  [phone]  Speech Language Pathology Treatment  Patient Details  Name: Justin [name] MRN: [id number] Date of Birth: 1940-05-21 Referring Provider (SLP): Dr. Leonie Man   Encounter Date: 12/06/2019   End of Session - 12/06/19 1047    Visit Number 6    Number of Visits 9    Date for SLP Re-Evaluation 01/06/20    SLP Start Time 0910    SLP Stop Time  0948    SLP Time Calculation (min) 38 min    Activity Tolerance Patient tolerated treatment well           Past Medical History:  Diagnosis Date   Brain bleed ([place])    Cancer ([place])    prostate   CHI (closed head injury) 04/2013   with [facility]   Depression    Diabetes mellitus without complication ([place])    High cholesterol    Hypertension    Stroke [facility])     Past Surgical History:  Procedure Laterality Date   arm & hand surgery Left 1972   skill saw accident   COLON SURGERY     partial   EYE SURGERY     lens implant and cateract- Left   PROSTATECTOMY      There were no vitals filed for this visit.   Subjective Assessment - 12/06/19 0917    Subjective "I went to the doctor yesterday. (Pt points to a small bandage on rt temple area)    Patient is accompained by: --   pt alone today   Currently in Pain? No/denies                 ADULT SLP TREATMENT - 12/06/19 0918      General Information   Behavior/Cognition Alert;Distractible;Requires cueing      Treatment Provided   Treatment provided Cognitive-Linquistic      Cognitive-Linquistic Treatment   Treatment focused on Cognition    Skilled Treatment Arrived alone today to ST. Pt stated wife did not get much sleep last night. SLP and pt had 10 minute conversation x2; one on his [place] and one about his family. SLP primarily perpetuated conversations with questions and comments 70% of the time,  however pt asked questions and made comments spontaneously. In the first 3 minutes of the session SLP reminded pt he had difficult time not looking out window last session and cued pt not to look out window this session, for 25 minutes when pt looked out the window he moved his head back immediately 90% of the time. SLP asked pt how he kept track of his PT exercises and pt got out his notebook and with extra time turned to his PT exercises - he told SLP that wife ensured pt did these and pt had numbers placed in approx 50% of the necessary places to track his completion. Pt explained with vague details 2/3 exercises and looked at descriptions in order to improve his description to SLP. He req'd min-mod SLP cues to look at instructions which followed his tracking sheet. As pt's ST homework was not completed at all from last session, a reminder was placed on pt's notebook for completion by 12-11-19.      Assessment / Recommendations / Plan   Plan Continue with current plan of care      Progression Toward Goals   Progression toward goals Progressing toward goals  SLP Long Term Goals - 12/06/19 1048      SLP LONG TERM GOAL #1   Title Pt will sustain attention to simple cognitive-linguistic tasks 5 minutes x 3 per session, over 2 visits.    Time 1    Status On-going      SLP LONG TERM GOAL #2   Title Pt will access/retrieve schedule or appointment information from memory system with min cues from spouse, x 3 sessions.    Baseline 12-04-19 (SLP), 12-06-19 (SLP)    Time 1    Period Weeks    Status On-going      SLP LONG TERM GOAL #3   Title Pt will participate in 5 minute simple conversation with supports/compensations x 3 visits.    Baseline (mod cues 10/5), 12-04-19, 12-06-19    Status On-going            Plan - 12/06/19 1048    Clinical Impression Statement Justin [name]") presents with cognitive communication impairments s/p multiple CVAs, a decline from  mild-moderate deficits when last seen at our clinic in 2019. Cognitive-linguistic testing (CLQT) completed and shows overall mild-moderate deficits; SLP feels level of deficit is functionally moderate-severe. Sustained/selective attention, memory, awareness, and language are impaired. Pt unlikely to make progress/achieve carryover without family involvement due to severity of deficits. Have encouraged wife to attend with pt at least every other session. I recommend trial course of ST for 4 weeks to determine if pt can make gains or benefit from pt/family compensation training.    Speech Therapy Frequency 2x / week    Duration 4 weeks   or 9 total visits   Treatment/Interventions SLP instruction and feedback;Functional tasks;Cognitive reorganization;Compensatory strategies;Patient/family education;Multimodal communcation approach;Internal/external aids    Potential to Achieve Goals Fair    Potential Considerations Ability to learn/carryover information;Cooperation/participation level;Medical prognosis;Severity of impairments;Previous level of function    Consulted and Agree with Plan of Care Patient;Family member/caregiver    Family Member Consulted spouse           Patient will benefit from skilled therapeutic intervention in order to improve the following deficits and impairments:   Cognitive communication deficit    Problem List Patient Active Problem List   Diagnosis Date Noted   Labile blood glucose    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   OSA (obstructive sleep apnea)    Controlled type 2 diabetes mellitus with hyperglycemia, without long-term current use of insulin (HCC)    CVA (cerebral vascular accident) ([place]) 07/16/2019   Dyslipidemia    Benign essential HTN    Diabetes mellitus type 2 in obese ([place])    Major depressive disorder, recurrent episode, moderate (HCC)    Gait disorder    Acute right MCA stroke ([place]) 06/28/2019   Ambulatory dysfunction      Stroke ([place]) 06/24/2019   Type 2 diabetes mellitus ([place]) 10/04/2018   Depression 10/04/2018   Acute CVA (cerebrovascular accident) ([place]) 09/18/2017   Stroke (cerebrum) ([place]) 09/17/2017   Diabetes mellitus without complication ([place]) 29/79/8921   Closed head injury 04/22/2013   HLD (hyperlipidemia) 07/29/2009   Essential hypertension 07/29/2009   EFFUSION, PLEURAL 07/29/2009   DIVERTICULITIS, COLON 07/29/2009   HYPERBILIRUBINEMIA 07/29/2009   COLONIC POLYPS, HYPERPLASTIC, HX OF 07/29/2009    Hanny Elsberry ,MS, CCC-SLP  12/06/2019, 10:49 AM  [facility] [address] [place], [place], [address] Phone: [phone]   Fax:  [phone]   Name: RITHWIK SCHMIEG MRN: [id number] Date of Birth:  03/11/1940 °

## 2019-12-06 NOTE — Therapy (Signed)
[facility] [address] [place], [place], [address] Phone: [phone]   Fax:  [phone]  Physical Therapy Treatment  Patient Details  Name: Justin [name] MRN: [id number] Date of Birth: January 16, 1941 Referring Provider (PT): Antony Contras, MD   Encounter Date: 12/06/2019   PT End of Session - 12/06/19 1106    Visit Number 11    Number of Visits 17    Date for PT Re-Evaluation 01/07/20   POC for 8 weeks, Cert for 90 days   Authorization Type [employer] (Auth Required)    Progress Note Due on Visit 10    PT Start Time 1102    PT Stop Time 1143    PT Time Calculation (min) 41 min    Equipment Utilized During Treatment Gait belt    Activity Tolerance Patient tolerated treatment well   easily distractable   Behavior During Therapy WFL for tasks assessed/performed           Past Medical History:  Diagnosis Date   Brain bleed ([place])    Cancer ([place])    prostate   CHI (closed head injury) 04/2013   with [place]   Depression    Diabetes mellitus without complication ([place])    High cholesterol    Hypertension    Stroke [facility])     Past Surgical History:  Procedure Laterality Date   arm & hand surgery Left 1972   skill saw accident   COLON SURGERY     partial   EYE SURGERY     lens implant and cateract- Left   PROSTATECTOMY      There were no vitals filed for this visit.   Subjective Assessment - 12/06/19 1105    Subjective Patient reports no new changes complaints since last visit. No falls. No pain.    Patient is accompained by: Family member   Wife   Pertinent History history of CVA, HTN, hypercholesterolemia, diabetes, depression, bursitis in R hip, and intracranial hemorrhage.    Limitations Walking;Standing    How long can you walk comfortably? 10-15 minutes    Patient Stated Goals Patient wants to walk straight, get back to doing what was before    Currently in Pain? No/denies                   [facility] Adult PT Treatment/Exercise - 12/06/19 0001      Ambulation/Gait   Ambulation/Gait Yes    Ambulation/Gait Assistance 4: Min guard    Ambulation/Gait Assistance Details completed ambulation into therapy session x 80 ft, and throughout therapy session with activites. continue verbal cues for improved step length. Patient wife not present at session, PT ambulating patietn out of therapy session.     Ambulation Distance (Feet) 80 Feet    Assistive device Rolling walker    Gait Pattern Step-through pattern;Decreased arm swing - right;Decreased arm swing - left;Decreased step length - right;Decreased stance time - right;Decreased dorsiflexion - right;Decreased hip/knee flexion - right;Poor foot clearance - right    Ambulation Surface Level;Indoor      High Level Balance   High Level Balance Activities Marching forwards;Backward walking    High Level Balance Comments in // bars completed: forwared marching x 4 laps, down and back with light UE support.v erbal cues for improved hip/knee flexion on RLE required throughout. Completed backwards walking x 4 laps, down and back. verbal cues for improved step length. patient demo improved step length with BUE support but decreased step length  with UE support removed.       Neuro Re-ed    Neuro Re-ed Details  Standing in // bars on rockerboard completed bean bag toss x 10 beans standing on rockerboard without UE support and postioned ant/post. Intermittent UE support and CGA required. Progressed to completing with board positoined laterally x 10 bean bag toss. Increased challenge with board positioned laterally.       Exercises   Exercises Knee/Hip      Knee/Hip Exercises: Aerobic   Other Aerobic Completed SciFit w/ BLE/BUE x 7 minutes at Level 2.0 to promote improved endurance/BLE strengthening, verbal cues to keep RPM > 55, increased verbal cues for pace required with distraction. Patietnt reporting arms got tired around 4 minute mark,  completing last two minutes without BUE use.                Balance Exercises - 12/06/19 0001      Balance Exercises: Standing   Stepping Strategy Anterior;Foam/compliant surface;Limitations    Stepping Strategy Limitations standing on red balance beam, completed anterior stepping strategy on/off balance beam x 10 reps, patient require UE uspport for completion as unable to complete iwthout UE support. CGA throughout.     Balance Beam completed static standing across red balance beam, 3 x 1 minute each without UE support focused on holding steady.,               PT Short Term Goals - 11/06/19 1200      PT SHORT TERM GOAL #1   Title Patient will be independent with HEP for strengthening/balance with wife superivison (ALL STGs Due: 11/06/19)    Baseline wife reports only doing seated exercies, every other day    Time 4    Period Weeks    Status Not Met    Target Date 11/06/19      PT SHORT TERM GOAL #2   Title Pt will improve 5x sit<>stand to less than or equal to 25 seconds for improved transfer efficiency and safety.    Baseline 31.34 secs, 29.84 seconds w/ UE support    Time 4    Period Weeks    Status Not Met      PT SHORT TERM GOAL #3   Title Pt will improve TUG score to less than or equal to 20 seconds for decreased fall risk.    Baseline 31.57 secs w/ RW, 25.63 secs w/ RW    Time 4    Period Weeks    Status Not Met      PT SHORT TERM GOAL #4   Title Berg Balance to be Assessed and LTG to be set as appropriate    Baseline 36/56 on 10/16/19    Time 4    Period Weeks    Status Achieved      PT SHORT TERM GOAL #5   Title Patient will demonstrate ability to ambulate >300 ft with LRAD and CGA for improved household mobility    Baseline 275 ft w/ CGA, max verbal cues    Time 4    Period Weeks    Status Partially Met             PT Long Term Goals - 10/16/19 1552      PT LONG TERM GOAL #1   Title Patient will be independent with final HEP for improved  strength and balance (ALL LTGs Due: 12/04/19)    Baseline no HEP established    Time 8    Period Weeks  Status New      PT LONG TERM GOAL #2   Title Pt will improve gait velocity to at least 2.0 ft/sec for decreased fall risk, improved gait efficiency.    Time 8    Period Weeks    Status New      PT LONG TERM GOAL #3   Title Pt will improve BERG to at least a 41/56 to demo decr fall risk.    Baseline 36/56    Time 8    Period Weeks    Status Revised      PT LONG TERM GOAL #4   Title Patient will improve TUG score to <15 seconds to demosntrate improved mobility and reduced fall risk    Baseline 31.57 secs    Time 8    Period Weeks    Status New      PT LONG TERM GOAL #5   Title Patient will improved 5x sit <> stand to </= 15 seconds to demonstrate improved functional mobility    Baseline 31.34 secs    Time 8    Period Weeks    Status New      PT LONG TERM GOAL #6   Title Patient and wife will report understanding of fall prevention within the home    Baseline currently dependent    Time 8    Period Weeks    Status New                 Plan - 12/06/19 1136    Clinical Impression Statement Continued progression of balance activities today during session with patient tolerating well. Increased balance challenge noted on rockerboard positioned laterally today, requiring intermittent CGA and UE support. Will continue to progress toward goals.    Personal Factors and Comorbidities Behavior Pattern;Comorbidity 3+    Comorbidities history of CVA, HTN, hypercholesterolemia, diabetes, depression, bursitis in R hip, and intracranial hemorrhage.    Examination-Activity Limitations Bed Mobility;Sit;Transfers;Stand;Toileting;Bathing;Dressing    Examination-Participation Restrictions [employer] Activity    Stability/Clinical Decision Making Evolving/Moderate complexity    Rehab Potential Fair    PT Frequency 2x / week    PT Duration 8 weeks    PT  Treatment/Interventions ADLs/Self Care Home Management;Cryotherapy;Electrical Stimulation;Moist Heat;DME Instruction;Gait training;Functional mobility Scientist, forensic;Therapeutic activities;Therapeutic exercise;Neuromuscular re-education;Balance training;Patient/family education;Orthotic Fit/Training;Manual techniques;Passive range of motion    PT Next Visit Plan Check goals + re-cert. continue scifit for endurance. seated/standing balance and strengthening, sit <> stand training, gait (maybe try colored bands for target). Complete session in therapy room if able for improved participation.    Consulted and Agree with Plan of Care Patient;Family member/caregiver    Family Member Consulted Wife           Patient will benefit from skilled therapeutic intervention in order to improve the following deficits and impairments:  Abnormal gait, Decreased endurance, Decreased balance, Decreased mobility, Difficulty walking, Hypomobility, Postural dysfunction, Pain, Decreased strength, Decreased safety awareness, Decreased knowledge of use of DME, Decreased coordination, Decreased activity tolerance, Decreased cognition, Decreased range of motion  Visit Diagnosis: Other abnormalities of gait and mobility  Unsteadiness on feet  Muscle weakness (generalized)     Problem List Patient Active Problem List   Diagnosis Date Noted   Labile blood glucose    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   OSA (obstructive sleep apnea)    Controlled type 2 diabetes mellitus with hyperglycemia, without long-term current use of insulin (HCC)    CVA (cerebral vascular accident) ([place]) 07/16/2019  Dyslipidemia    Benign essential HTN    Diabetes mellitus type 2 in obese [facility])    Major depressive disorder, recurrent episode, moderate (HCC)    Gait disorder    Acute right MCA stroke ([place]) 06/28/2019   Ambulatory dysfunction    Stroke ([place]) 06/24/2019   Type 2 diabetes mellitus  ([place]) 10/04/2018   Depression 10/04/2018   Acute CVA (cerebrovascular accident) ([place]) 09/18/2017   Stroke (cerebrum) ([place]) 09/17/2017   Diabetes mellitus without complication ([place]) 16/40/8909   Closed head injury 04/22/2013   HLD (hyperlipidemia) 07/29/2009   Essential hypertension 07/29/2009   EFFUSION, PLEURAL 07/29/2009   DIVERTICULITIS, COLON 07/29/2009   HYPERBILIRUBINEMIA 07/29/2009   COLONIC POLYPS, HYPERPLASTIC, HX OF 07/29/2009    [place] Bales, PT, DPT 12/06/2019, 11:46 AM  [facility] [address] [place], [place], [address] Phone: [phone]   Fax:  [phone]  Name: Justin [name] MRN: [id number] Date of Birth: 23-Nov-1940

## 2019-12-06 NOTE — Patient Instructions (Signed)
  Make sure to complete your speech homework for [facility] on 12-11-19!

## 2019-12-11 ENCOUNTER — Other Ambulatory Visit: Payer: Self-pay

## 2019-12-11 ENCOUNTER — Encounter: Payer: Self-pay | Admitting: Occupational Therapy

## 2019-12-11 ENCOUNTER — Ambulatory Visit: Payer: Medicare HMO

## 2019-12-11 ENCOUNTER — Ambulatory Visit: Payer: Medicare HMO | Admitting: Speech Pathology

## 2019-12-11 ENCOUNTER — Ambulatory Visit: Payer: Medicare HMO | Admitting: Occupational Therapy

## 2019-12-11 DIAGNOSIS — R2681 Unsteadiness on feet: Secondary | ICD-10-CM | POA: Diagnosis not present

## 2019-12-11 DIAGNOSIS — R2689 Other abnormalities of gait and mobility: Secondary | ICD-10-CM

## 2019-12-11 DIAGNOSIS — R41840 Attention and concentration deficit: Secondary | ICD-10-CM

## 2019-12-11 DIAGNOSIS — R4701 Aphasia: Secondary | ICD-10-CM

## 2019-12-11 DIAGNOSIS — R41841 Cognitive communication deficit: Secondary | ICD-10-CM

## 2019-12-11 DIAGNOSIS — M6281 Muscle weakness (generalized): Secondary | ICD-10-CM

## 2019-12-11 NOTE — Therapy (Signed)
[facility] [address] [place], [place], [address] Phone: [phone]   Fax:  [phone]  Physical Therapy Treatment/Re-Certification  Patient Details  Name: Justin [name] MRN: [id number] Date of Birth: 27-Feb-1940 Referring Provider (PT): Antony Contras, MD   Encounter Date: 12/11/2019   PT End of Session - 12/11/19 1456    Visit Number 12    Number of Visits 19    Date for PT Re-Evaluation 02/09/20   POC for 4 weeks, Cert for 60 days   Authorization Type [employer] (Auth Required)    Progress Note Due on Visit 10    PT Start Time 1231    PT Stop Time 1314    PT Time Calculation (min) 43 min    Equipment Utilized During Treatment Gait belt    Activity Tolerance Patient tolerated treatment well   easily distractable   Behavior During Therapy WFL for tasks assessed/performed           Past Medical History:  Diagnosis Date   Brain bleed ([place])    Cancer ([place])    prostate   CHI (closed head injury) 04/2013   with [place]   Depression    Diabetes mellitus without complication ([place])    High cholesterol    Hypertension    Stroke [facility])     Past Surgical History:  Procedure Laterality Date   arm & hand surgery Left 1972   skill saw accident   COLON SURGERY     partial   EYE SURGERY     lens implant and cateract- Left   PROSTATECTOMY      There were no vitals filed for this visit.   Subjective Assessment - 12/11/19 1236    Subjective Patient reports had a fall this morning that he was trying to take his shirt off in standing and slipped and fell forwards. Reports no injuries. No pain.    Patient is accompained by: Family member   Wife   Pertinent History history of CVA, HTN, hypercholesterolemia, diabetes, depression, bursitis in R hip, and intracranial hemorrhage.    Limitations Walking;Standing    How long can you walk comfortably? 10-15 minutes    Patient Stated Goals Patient wants to  walk straight, get back to doing what was before    Currently in Pain? No/denies              [facility] PT Assessment - 12/11/19 0001      Assessment   Medical Diagnosis MCA CVA    Referring Provider (PT) Antony Contras, MD                         [facility] Adult PT Treatment/Exercise - 12/11/19 0001      Transfers   Transfers Sit to Stand;Stand to Sit    Sit to Stand 5: Supervision    Sit to Stand Details Verbal cues for sequencing;Verbal cues for technique;Verbal cues for safe use of DME/AE    Sit to Stand Details (indicate cue type and reason) patient continue to require intermittent cueing for hand placement    Five time sit to stand comments  25.16 secs w/ UE support from mat at standard chair height    Stand to Sit 5: Supervision      Ambulation/Gait   Ambulation/Gait Yes    Ambulation/Gait Assistance 4: Min guard;5: Supervision    Ambulation/Gait Assistance Details completed ambulation into therapy session and throughout therapy gym with activities  Ambulation Distance (Feet) --   clinic distances   Assistive device Rolling walker    Gait Pattern Step-through pattern;Decreased arm swing - right;Decreased arm swing - left;Decreased step length - right;Decreased stance time - right;Decreased dorsiflexion - right;Decreased hip/knee flexion - right;Poor foot clearance - right    Ambulation Surface Level;Indoor    Gait velocity 33.19 secs = 0.99 ft/sec      Standardized Balance Assessment   Standardized Balance Assessment Berg Balance Test;Timed Up and Go Test      Berg Balance Test   Sit to Stand Able to stand  independently using hands    Standing Unsupported Able to stand 2 minutes with supervision    Sitting with Back Unsupported but Feet Supported on Floor or Stool Able to sit safely and securely 2 minutes    Stand to Sit Sits safely with minimal use of hands    Transfers Able to transfer safely, definite need of hands    Standing Unsupported with Eyes  Closed Able to stand 10 seconds with supervision    Standing Ubsupported with Feet Together Able to place feet together independently and stand for 1 minute with supervision    From Standing, Reach Forward with Outstretched Arm Can reach forward >12 cm safely (5")    From Standing Position, Pick up Object from [place] to pick up shoe, needs supervision    From Standing Position, Turn to Look Behind Over each Shoulder Looks behind one side only/other side shows less weight shift    Turn 360 Degrees Needs close supervision or verbal cueing    Standing Unsupported, Alternately Place Feet on Step/Stool Able to stand independently and complete 8 steps >20 seconds    Standing Unsupported, One Foot in Front Able to take small step independently and hold 30 seconds    Standing on One Leg Tries to lift leg/unable to hold 3 seconds but remains standing independently    Total Score 39      Timed Up and Go Test   TUG Normal TUG    Normal TUG (seconds) 23.28      Self-Care   Self-Care Other Self-Care Comments    Other Self-Care Comments  PT having frank conversation with patient and wife that there has been progress with PT services however progress has been limited due to carryover at home. Patient is not completing HEP consistently and wife reports that he is unmotivated sitting majority of waking hours. PT educating importance of compliance and functional mobility daily ot promote gains with PT services. PT educating to complete HEP daily as well as walking in the house once every hour to promote physical activity and mobility. Wife and patient verbalizing understanding.                   PT Education - 12/11/19 1456    Education Details See Self Care; Updated POC; Safety within Home (including to not take clothes off in standing due to fall risk)    Person(s) Educated Patient;Spouse    Methods Explanation    Comprehension Verbalized understanding            PT Short Term Goals -  11/06/19 1200      PT SHORT TERM GOAL #1   Title Patient will be independent with HEP for strengthening/balance with wife superivison (ALL STGs Due: 11/06/19)    Baseline wife reports only doing seated exercies, every other day    Time 4    Period Weeks    Status  Not Met    Target Date 11/06/19      PT SHORT TERM GOAL #2   Title Pt will improve 5x sit<>stand to less than or equal to 25 seconds for improved transfer efficiency and safety.    Baseline 31.34 secs, 29.84 seconds w/ UE support    Time 4    Period Weeks    Status Not Met      PT SHORT TERM GOAL #3   Title Pt will improve TUG score to less than or equal to 20 seconds for decreased fall risk.    Baseline 31.57 secs w/ RW, 25.63 secs w/ RW    Time 4    Period Weeks    Status Not Met      PT SHORT TERM GOAL #4   Title Berg Balance to be Assessed and LTG to be set as appropriate    Baseline 36/56 on 10/16/19    Time 4    Period Weeks    Status Achieved      PT SHORT TERM GOAL #5   Title Patient will demonstrate ability to ambulate >300 ft with LRAD and CGA for improved household mobility    Baseline 275 ft w/ CGA, max verbal cues    Time 4    Period Weeks    Status Partially Met             PT Long Term Goals - 12/11/19 1237      PT LONG TERM GOAL #1   Title Patient will be independent with final HEP for improved strength and balance (ALL LTGs Due: 12/04/19)    Baseline reports completed few times weekly.    Time 8    Period Weeks    Status Partially Met      PT LONG TERM GOAL #2   Title Pt will improve gait velocity to at least 2.0 ft/sec for decreased fall risk, improved gait efficiency.    Baseline 0.99 ft/sec    Time 8    Period Weeks    Status Not Met      PT LONG TERM GOAL #3   Title Pt will improve BERG to at least a 41/56 to demo decr fall risk.    Baseline 36/56, 39/36    Time 8    Period Weeks    Status Not Met      PT LONG TERM GOAL #4   Title Patient will improve TUG score to <15  seconds to demosntrate improved mobility and reduced fall risk    Baseline 31.57 secs, 23.28 secs w/ RW    Time 8    Period Weeks    Status Not Met      PT LONG TERM GOAL #5   Title Patient will improved 5x sit <> stand to </= 15 seconds to demonstrate improved functional mobility    Baseline 31.34 secs, 25.16 secs w/ UE support    Time 8    Period Weeks    Status Not Met      PT LONG TERM GOAL #6   Title Patient and wife will report understanding of fall prevention within the home    Baseline currently dependent, unable to assess due to time    Time 8    Period Weeks    Status On-going           Updated Short Term Goals:   PT Short Term Goals - 12/11/19 1518      PT SHORT TERM GOAL #1  Title = LTGs           Updated Long Term Goals:   PT Long Term Goals - 12/11/19 1519      PT LONG TERM GOAL #1   Title Patient will be independent with final HEP and walking 3x/daily in the home for improved strength and mobility (ALL LTGs Due: 01/22/20)    Baseline reports completing HEP few times weekly.    Time 4    Period Weeks    Status Revised    Target Date 01/22/20   date extended due to scheduling     PT LONG TERM GOAL #2   Title Pt will improve gait velocity to at least 1.5 ft/sec for decreased fall risk and improved gait efficiency.    Baseline 0.99 ft/sec    Time 4    Period Weeks    Status Revised      PT LONG TERM GOAL #3   Title Pt will improve BERG to at least a 41/56 to demo decr fall risk.    Baseline 36/56, 39/36    Time 4    Period Weeks    Status Revised      PT LONG TERM GOAL #4   Title Patient will improve TUG score to <20 seconds to demosntrate improved mobility and reduced fall risk    Baseline 31.57 secs, 23.28 secs w/ RW    Time 4    Period Weeks    Status Revised      PT LONG TERM GOAL #5   Title Patient will improved 5x sit <> stand to </= 20 seconds to demonstrate improved functional mobility    Baseline 31.34 secs, 25.16 secs w/ UE  support    Time 4    Period Weeks    Status Revised      PT LONG TERM GOAL #6   Title Patient and wife will report understanding of fall prevention within the home    Baseline currently dependent, unable to assess due to time    Time 8    Period Weeks    Status On-going               Plan - 12/11/19 1512    Clinical Impression Statement Today's session included assessment of patient's progress toward all LTG. Patient able to demonstrate progress toward goals, but unable to meet any LTGs at this time. Patient did demo improvements on [employer] [phone]), TUG, and 5x sit <> stand. However progress has been limited due to attention deficits, and limited carryover at home. PT provided extensive education on importance of HEP and walking multiple times daily in the home to further progress. Patient and wife verbalizing understanding. Patient will continue to benefit from skilled PT services to progress toward all goals.    Personal Factors and Comorbidities Behavior Pattern;Comorbidity 3+    Comorbidities history of CVA, HTN, hypercholesterolemia, diabetes, depression, bursitis in R hip, and intracranial hemorrhage.    Examination-Activity Limitations Bed Mobility;Sit;Transfers;Stand;Toileting;Bathing;Dressing    Examination-Participation Restrictions [employer] Activity    Stability/Clinical Decision Making Evolving/Moderate complexity    Rehab Potential Fair    PT Frequency 2x / week    PT Duration 4 weeks    PT Treatment/Interventions ADLs/Self Care Home Management;Cryotherapy;Electrical Stimulation;Moist Heat;DME Instruction;Gait training;Functional mobility Scientist, forensic;Therapeutic activities;Therapeutic exercise;Neuromuscular re-education;Balance training;Patient/family education;Orthotic Fit/Training;Manual techniques;Passive range of motion    PT Next Visit Plan continue scifit for endurance. seated/standing balance and strengthening, sit <> stand training, gait  (maybe try colored bands for  target). Complete session in therapy room if able for improved participation.    Consulted and Agree with Plan of Care Patient;Family member/caregiver    Family Member Consulted Wife           Patient will benefit from skilled therapeutic intervention in order to improve the following deficits and impairments:  Abnormal gait, Decreased endurance, Decreased balance, Decreased mobility, Difficulty walking, Hypomobility, Postural dysfunction, Pain, Decreased strength, Decreased safety awareness, Decreased knowledge of use of DME, Decreased coordination, Decreased activity tolerance, Decreased cognition, Decreased range of motion  Visit Diagnosis: Other abnormalities of gait and mobility  Unsteadiness on feet  Muscle weakness (generalized)     Problem List Patient Active Problem List   Diagnosis Date Noted   Labile blood glucose    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) 06/24/2019   Type 2 diabetes mellitus ([place]) 10/04/2018   Depression 10/04/2018   Acute CVA (cerebrovascular accident) ([place]) 09/18/2017   Stroke (cerebrum) ([place]) 09/17/2017   Diabetes mellitus without complication ([place]) 70/14/1030   Closed head injury 04/22/2013   HLD (hyperlipidemia) 07/29/2009   Essential hypertension 07/29/2009   EFFUSION, PLEURAL 07/29/2009   DIVERTICULITIS, COLON 07/29/2009   HYPERBILIRUBINEMIA 07/29/2009   COLONIC POLYPS, HYPERPLASTIC, HX OF 07/29/2009    Jones Bales, PT,  DPT 12/11/2019, 3:18 PM  [facility] [address] [place], [place], [address] Phone: [phone]   Fax:  [phone]  Name: RAJOHN HENERY MRN: [id number] Date of Birth: [birth year]

## 2019-12-11 NOTE — Therapy (Signed)
[name] [address] [name], [place], [address] Phone: [phone]   Fax:  [phone]  Speech Language Pathology Treatment  Patient Details  Name: Justin [name] MRN: [id number] Date of Birth: 05/28/1940 Referring Provider (SLP): Dr. Leonie Man   Encounter Date: 12/11/2019   End of Session - 12/11/19 1441    Visit Number 7    Number of Visits 9    Date for SLP Re-Evaluation 01/06/20    SLP Start Time 4765    SLP Stop Time  1445    SLP Time Calculation (min) 43 min    Activity Tolerance Patient tolerated treatment well           Past Medical History:  Diagnosis Date  . Brain bleed ([place])   . Cancer [facility])    prostate  . 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 Depression   . Diabetes mellitus without complication ([place])   . High cholesterol   . Hypertension   . Stroke [facility])     Past Surgical History:  Procedure Laterality Date  . arm & hand surgery Left 1972   skill saw accident  . COLON SURGERY     partial  . EYE SURGERY     lens implant and cateract- Left  . PROSTATECTOMY      There were no vitals filed for this visit.   Subjective Assessment - 12/11/19 1404    Subjective "I got it done, I just didn't bring it." (no notebook today)    Currently in Pain? No/denies                 ADULT SLP TREATMENT - 12/11/19 1405      General Information   Behavior/Cognition Alert;Distractible;Requires cueing      Treatment Provided   Treatment provided Cognitive-Linquistic      Pain Assessment   Pain Assessment No/denies pain      Cognitive-Linquistic Treatment   Treatment focused on Cognition                SLP Long Term Goals - 12/11/19 1506      SLP LONG TERM GOAL #1   Title Pt will sustain attention to simple cognitive-linguistic tasks 5 minutes x 3 per session, over 2 visits.    Time 1    Status On-going      SLP LONG TERM GOAL #2   Title Pt will access/retrieve  schedule or appointment information from memory system with min cues from spouse, x 3 sessions.    Baseline 12-04-19 (SLP), 12-06-19 (SLP)    Time 1    Period Weeks    Status On-going      SLP LONG TERM GOAL #3   Title Pt will participate in 5 minute simple conversation with supports/compensations x 3 visits.    Baseline (mod cues 10/5), 12-04-19, 12-06-19 12-11-19    Status Achieved            Plan - 12/11/19 1507    Clinical Impression Statement [name]") presents with cognitive communication impairments s/p multiple CVAs, a decline from mild-moderate deficits when last seen at our clinic in 2019. Cognitive-linguistic testing (CLQT) completed and shows overall mild-moderate deficits; SLP feels level of deficit is functionally moderate-severe. Sustained/selective attention, memory, awareness, and language are impaired. Pt unlikely to make progress/achieve carryover without family involvement due to severity of deficits. Have encouraged wife to attend with pt at least every other session; she has not been  present since visit #3. Requested she attend next 1-2 sessions for caregiver training prior to d/c. Anticipate d/c in next 1-2 sessions.    Speech Therapy Frequency 2x / week    Duration 4 weeks   or 9 total visits   Treatment/Interventions SLP instruction and feedback;Functional tasks;Cognitive reorganization;Compensatory strategies;Patient/family education;Multimodal communcation approach;Internal/external aids    Potential to Achieve Goals Fair    Potential Considerations Ability to learn/carryover information;Cooperation/participation level;Medical prognosis;Severity of impairments;Previous level of function    Consulted and Agree with Plan of Care Patient;Family member/caregiver    Family Member Consulted spouse           Patient will benefit from skilled therapeutic intervention in order to improve the following deficits and impairments:   Cognitive communication  deficit  Aphasia    Problem List Patient Active Problem List   Diagnosis Date Noted  . Labile blood glucose   . 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 OSA (obstructive sleep apnea)   . Controlled type 2 diabetes mellitus with hyperglycemia, without long-term current use of insulin ([place])   . CVA (cerebral vascular accident) ([place]) 07/16/2019  . Dyslipidemia   . Benign essential HTN   . Diabetes mellitus type 2 in obese ([place])   . Major depressive disorder, recurrent episode, moderate ([place])   . Gait disorder   . Acute right MCA stroke ([place]) 06/28/2019  . Ambulatory dysfunction   . Stroke ([place]) 06/24/2019  . Type 2 diabetes mellitus ([place]) 10/04/2018  . Depression 10/04/2018  . Acute CVA (cerebrovascular accident) ([place]) 09/18/2017  . Stroke (cerebrum) ([place]) 09/17/2017  . Diabetes mellitus without complication ([place]) 37/05/8887  . Closed head injury 04/22/2013  . HLD (hyperlipidemia) 07/29/2009  . Essential hypertension 07/29/2009  . EFFUSION, PLEURAL 07/29/2009  . DIVERTICULITIS, COLON 07/29/2009  . 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 COLONIC POLYPS, HYPERPLASTIC, HX OF 07/29/2009   Deneise Lever, [place], [place] 12/11/2019, 3:11 PM  [facility] [address] [place], [place], [address] Phone: [phone]   Fax:  [phone]   Name: Justin [name] MRN: [id number] Date of Birth: 12/13/1940

## 2019-12-11 NOTE — Patient Instructions (Signed)
Bruno,   You are doing a great job with conversation in a quiet room. When you are at home with your wife, it would be a good idea to turn off the TV and sit in a place where you will not be distracted by anything and practice having a conversation. We will see you for 1-2 more speech therapy sessions. Don't forget to bring your notebook. It would be good if you wife could join you for next session or the one after that for [place] to wrap things up.   Olean Ree the speech therapist
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
## 2019-12-13 ENCOUNTER — Ambulatory Visit: Payer: Medicare HMO | Admitting: Occupational Therapy

## 2019-12-13 ENCOUNTER — Other Ambulatory Visit: Payer: Self-pay

## 2019-12-13 ENCOUNTER — Ambulatory Visit: Payer: Medicare HMO

## 2019-12-13 ENCOUNTER — Ambulatory Visit: Payer: Medicare HMO | Admitting: Speech Pathology

## 2019-12-13 DIAGNOSIS — R2681 Unsteadiness on feet: Secondary | ICD-10-CM | POA: Diagnosis not present

## 2019-12-13 DIAGNOSIS — M6281 Muscle weakness (generalized): Secondary | ICD-10-CM

## 2019-12-13 DIAGNOSIS — R2689 Other abnormalities of gait and mobility: Secondary | ICD-10-CM

## 2019-12-13 NOTE — Therapy (Signed)
[facility] [address] [place], [place], [address] Phone: [phone]   Fax:  [phone]  Physical Therapy Treatment  Patient Details  Name: Justin [name] MRN: [id number] Date of Birth: February 20, 1941 Referring Provider (PT): Antony Contras, MD   Encounter Date: 12/13/2019   PT End of Session - 12/13/19 1238    Visit Number 13    Number of Visits 19    Date for PT Re-Evaluation 02/09/20   POC for 4 weeks, Cert for 60 days   Authorization Type [employer] (Auth Required)    Progress Note Due on Visit 10    PT Start Time 1232    PT Stop Time 0086   patient/wife requesting to end session due to patient using bathroom in clothing and not having second set of clothes.   PT Time Calculation (min) 27 min    Equipment Utilized During Treatment Gait belt    Activity Tolerance Patient tolerated treatment well   easily distractable   Behavior During Therapy WFL for tasks assessed/performed           Past Medical History:  Diagnosis Date  . Brain bleed ([place])   . Cancer [facility])    prostate  . 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 Depression   . Diabetes mellitus without complication ([place])   . High cholesterol   . Hypertension   . Stroke [facility]     Past Surgical History:  Procedure Laterality Date  . arm & hand surgery Left 1972   skill saw accident  . COLON SURGERY     partial  . EYE SURGERY     lens implant and cateract- Left  . PROSTATECTOMY      There were no vitals filed for this visit.   Subjective Assessment - 12/13/19 1236    Subjective Patient/wife reports no more falls since last visit. Patient/wife also reports that he completed HEP and has been walking intermittently in the house.    Patient is accompained by: Family member   Wife   Pertinent History history of CVA, HTN, hypercholesterolemia, diabetes, depression, bursitis in R hip, and intracranial hemorrhage.    Limitations  Walking;Standing    How long can you walk comfortably? 10-15 minutes    Patient Stated Goals Patient wants to walk straight, get back to doing what was before    Currently in Pain? No/denies               [facility] Adult PT Treatment/Exercise - 12/13/19 0001      Transfers   Transfers Sit to Stand;Stand to Sit    Sit to Stand 5: Supervision    Sit to Stand Details Verbal cues for sequencing;Verbal cues for technique;Verbal cues for safe use of DME/AE    Stand to Sit 5: Supervision    Stand to Sit Details (indicate cue type and reason) Verbal cues for sequencing;Verbal cues for technique;Verbal cues for safe use of DME/AE    Comments patient continues to require vebral cues for hand placement prior to descent.       Ambulation/Gait   Ambulation/Gait Yes    Ambulation/Gait Assistance 4: Min guard    Ambulation/Gait Assistance Details completed ambulation in therapy gym with RW, continue verbal cues for improved step length with RLE, x 115 ft    Ambulation Distance (Feet) 115 Feet    Assistive device Rolling walker    Gait Pattern Step-through pattern;Decreased arm swing - right;Decreased arm swing -  left;Decreased step length - right;Decreased stance time - right;Decreased dorsiflexion - right;Decreased hip/knee flexion - right;Poor foot clearance - right    Ambulation Surface Level;Indoor      Neuro Re-ed    Neuro Re-ed Details  Standing in // bars with BUE support completed fwd/backward steps over black balance beam x 10 reps, verbal cues for improved hip/knee flexion to promote clearance. Progressed to completing with RLE stepping fw/back over beam able to complete without UE support x 10 reps, but require patient to hold pillowcase to avoid UE support, CGA required with completion. Also worked on turning R/L x 10 reps each direction without UE support to promote improved balance. Increased difficulty noted as patient drags RLE.       Exercises   Exercises Knee/Hip      Knee/Hip  Exercises: Standing   Forward Step Up Right;1 set;10 reps;Hand Hold: 2;Step Height: 6"    Forward Step Up Limitations verbal cues for improved hip/knee flexion onto step, also verbal cues for reduced reliance on UE support to promote BLE strengthening.                     PT Short Term Goals - 12/11/19 1518      PT SHORT TERM GOAL #1   Title = LTGs             PT Long Term Goals - 12/11/19 1519      PT LONG TERM GOAL #1   Title Patient will be independent with final HEP and walking 3x/daily in the home for improved strength and mobility (ALL LTGs Due: 01/22/20)    Baseline reports completing HEP few times weekly.    Time 4    Period Weeks    Status Revised    Target Date 01/22/20   date extended due to scheduling     PT LONG TERM GOAL #2   Title Pt will improve gait velocity to at least 1.5 ft/sec for decreased fall risk and improved gait efficiency.    Baseline 0.99 ft/sec    Time 4    Period Weeks    Status Revised      PT LONG TERM GOAL #3   Title Pt will improve BERG to at least a 41/56 to demo decr fall risk.    Baseline 36/56, 39/36    Time 4    Period Weeks    Status Revised      PT LONG TERM GOAL #4   Title Patient will improve TUG score to <20 seconds to demosntrate improved mobility and reduced fall risk    Baseline 31.57 secs, 23.28 secs w/ RW    Time 4    Period Weeks    Status Revised      PT LONG TERM GOAL #5   Title Patient will improved 5x sit <> stand to </= 20 seconds to demonstrate improved functional mobility    Baseline 31.34 secs, 25.16 secs w/ UE support    Time 4    Period Weeks    Status Revised      PT LONG TERM GOAL #6   Title Patient and wife will report understanding of fall prevention within the home    Baseline currently dependent, unable to assess due to time    Time 8    Period Weeks    Status On-going                 Plan - 12/13/19 1320    Clinical  Impression Statement Today's skilled PT session  included continued activities to promote improved RLE strengthening as well as RLE clearance with activities. Patient continue to require max verbal cues. Session limited due to patient using bathroom in clothes and requesting to leave early.    Personal Factors and Comorbidities Behavior Pattern;Comorbidity 3+    Comorbidities history of CVA, HTN, hypercholesterolemia, diabetes, depression, bursitis in R hip, and intracranial hemorrhage.    Examination-Activity Limitations Bed Mobility;Sit;Transfers;Stand;Toileting;Bathing;Dressing    Examination-Participation Restrictions [employer] Activity    Stability/Clinical Decision Making Evolving/Moderate complexity    Rehab Potential Fair    PT Frequency 2x / week    PT Duration 4 weeks    PT Treatment/Interventions ADLs/Self Care Home Management;Cryotherapy;Electrical Stimulation;Moist Heat;DME Instruction;Gait training;Functional mobility Scientist, forensic;Therapeutic activities;Therapeutic exercise;Neuromuscular re-education;Balance training;Patient/family education;Orthotic Fit/Training;Manual techniques;Passive range of motion    PT Next Visit Plan continue scifit for endurance. seated/standing balance and strengthening, sit <> stand training, gait (maybe try colored bands for target). Complete session in therapy room if able for improved participation.    Consulted and Agree with Plan of Care Patient;Family member/caregiver    Family Member Consulted Wife           Patient will benefit from skilled therapeutic intervention in order to improve the following deficits and impairments:  Abnormal gait, Decreased endurance, Decreased balance, Decreased mobility, Difficulty walking, Hypomobility, Postural dysfunction, Pain, Decreased strength, Decreased safety awareness, Decreased knowledge of use of DME, Decreased coordination, Decreased activity tolerance, Decreased cognition, Decreased range of motion  Visit Diagnosis: Unsteadiness on  feet  Other abnormalities of gait and mobility  Muscle weakness (generalized)     Problem List Patient Active Problem List   Diagnosis Date Noted  . Labile blood glucose   . 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 OSA (obstructive sleep apnea)   . Controlled type 2 diabetes mellitus with hyperglycemia, without long-term current use of insulin ([place])   . CVA (cerebral vascular accident) ([place]) 07/16/2019  . Dyslipidemia   . Benign essential HTN   . Diabetes mellitus type 2 in obese ([place])   . Major depressive disorder, recurrent episode, moderate ([place])   . Gait disorder   . Acute right MCA stroke ([place]) 06/28/2019  . Ambulatory dysfunction   . Stroke ([place]) 06/24/2019  . Type 2 diabetes mellitus ([place]) 10/04/2018  . Depression 10/04/2018  . Acute CVA (cerebrovascular accident) ([place]) 09/18/2017  . Stroke (cerebrum) ([place]) 09/17/2017  . Diabetes mellitus without complication ([place]) 85/63/1497  . Closed head injury 04/22/2013  . HLD (hyperlipidemia) 07/29/2009  . Essential hypertension 07/29/2009  . EFFUSION, PLEURAL 07/29/2009  . DIVERTICULITIS, COLON 07/29/2009  . 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 COLONIC POLYPS, HYPERPLASTIC, HX OF 07/29/2009    Jones Bales, PT, DPT 12/13/2019, 1:22 PM  [facility] [address] [place], [place], [address] Phone: [phone]   Fax:  [phone]  Name: Justin [name] MRN: [id number] Date of Birth: 1940/10/23

## 2019-12-27 ENCOUNTER — Ambulatory Visit: Payer: Medicare HMO | Attending: Neurology | Admitting: Occupational Therapy

## 2019-12-27 ENCOUNTER — Other Ambulatory Visit: Payer: Self-pay

## 2019-12-27 ENCOUNTER — Encounter: Payer: Self-pay | Admitting: Occupational Therapy

## 2019-12-27 DIAGNOSIS — M6281 Muscle weakness (generalized): Secondary | ICD-10-CM | POA: Insufficient documentation

## 2019-12-27 DIAGNOSIS — R2681 Unsteadiness on feet: Secondary | ICD-10-CM

## 2019-12-27 DIAGNOSIS — R41840 Attention and concentration deficit: Secondary | ICD-10-CM | POA: Diagnosis present

## 2019-12-27 NOTE — Therapy (Signed)
[facility] [address] [place], [place], [address] Phone: [phone]   Fax:  [phone]  Occupational Therapy Treatment  Patient Details  Name: Justin [name] MRN: [id number] Date of Birth: Dec 12, 1940 No data recorded  Encounter Date: 12/27/2019   OT End of Session - 12/27/19 1358    Visit Number 7    Number of Visits 13    Authorization Type [employer] Medicare:  [place] auth 11/06/19-12/24/19  for 13 visits, covered 100%    Authorization - Visit Number 7    Authorization - Number of Visits 13    Progress Note Due on Visit 10    OT Start Time 1315    OT Stop Time 1400    OT Time Calculation (min) 45 min    Activity Tolerance Patient tolerated treatment well    Behavior During Therapy WFL for tasks assessed/performed           Past Medical History:  Diagnosis Date  . Brain bleed ([place])   . Cancer [facility])    prostate  . 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 Depression   . Diabetes mellitus without complication ([place])   . High cholesterol   . Hypertension   . Stroke [facility])     Past Surgical History:  Procedure Laterality Date  . arm & hand surgery Left 1972   skill saw accident  . COLON SURGERY     partial  . EYE SURGERY     lens implant and cateract- Left  . PROSTATECTOMY      There were no vitals filed for this visit.   Subjective Assessment - 12/27/19 1330    Subjective  Patient indicates he was able to make a ham and egg sandwhich this morning    Pertinent History per wife patient has had 5 strokes, last two in May 2021    Limitations Cognition    Currently in Pain? No/denies    Pain Score 0-No pain                        OT Treatments/Exercises (OP) - 12/27/19 0001      ADLs   Cooking Patient indicates that he has been making sandwhiches at home.  Indocating today that he made ham and egg sandwhich.  Patient with profound cognitive impairnents, and inattention to right  side.  Patient consistently walks in clinic with RW and with right leg externally rotated and behind left leg and often outside walker.  Patient needs cueing every third step.  In standing at countertop - patient needs frequent repositioning of feet to balance self.  Min assist needed throughout simple, familiar cooking task.                      OT Short Term Goals - 12/27/19 1400      OT SHORT TERM GOAL #1   Title Patient and wife will compile a list of activities patient can safely participate in at home - e.g. making sandwich, picking out clothing, etc. due 12/21/19    Time 4    Period Weeks    Status Achieved      OT SHORT TERM GOAL #2   Title Patient will demonstrate ability to attend for 5 min or more to a familiar activity  in minimally distracting environment    Time 4    Period Weeks    Status Achieved  OT SHORT TERM GOAL #3   Title Patient will complete a home exercise program to address upper body active movement and general strength with min cueing and set up    Time 4    Period Weeks             OT Long Term Goals - 12/27/19 1400      OT LONG TERM GOAL #1   Title Patient will complete a home activities rogram designed to help him safely engage in IADL with minimal prompting    Time 6    Period Weeks    Status On-going      OT LONG TERM GOAL #2   Title Patient will demonstrate sufficient standing balance to stand at counter top to complete a familiar functional task - eg prepare a snack    Time 6    Period Weeks    Status On-going      OT LONG TERM GOAL #3   Title Patient will follow a daily schedule to outline sleep and wake times, meal times, and activity times to reduce amount of idol TV watching time during day.    Time 6    Period Weeks    Status On-going                 Plan - 12/27/19 1358    Clinical Impression Statement Patient continues to require near constant cueing for safety with all ambulation.  [name]  expressing frustration that patient"does nothing" at home.    OT Occupational Profile and History Detailed Assessment- Review of Records and additional review of physical, cognitive, psychosocial history related to current functional performance    Occupational performance deficits (Please refer to evaluation for details): ADL's;IADL's    Body Structure / Function / Physical Skills ADL;Coordination;Endurance;GMC;Balance;Decreased knowledge of precautions;Sensation;IADL;Flexibility;Decreased knowledge of use of DME;Body mechanics;Cardiopulmonary status limiting activity;Dexterity;FMC;Strength    Cognitive Skills Attention;Energy/Drive;Memory;Safety Awareness;Problem Solve;Perception;Orientation;Sequencing    Rehab Potential Fair    Clinical Decision Making Limited treatment options, no task modification necessary    Comorbidities Affecting Occupational Performance: May have comorbidities impacting occupational performance    Modification or Assistance to Complete Evaluation  Min-Moderate modification of tasks or assist with assess necessary to complete eval    OT Frequency 2x / week    OT Duration 6 weeks    OT Treatment/Interventions Self-care/ADL training;Therapeutic exercise;Neuromuscular education;Patient/family education;Balance training;Therapeutic activities;Functional Mobility Training;Cognitive remediation/compensation;DME and/or AE instruction    Plan Need to work on selective attention while standing or walking, turning, backing up- e.g.  making grilled cheese sandwich, NMR RUE/LE,    Consulted and Agree with Plan of Care Patient    Family Member Consulted [name]           Patient will benefit from skilled therapeutic intervention in order to improve the following deficits and impairments:   Body Structure / Function / Physical Skills: ADL, Coordination, Endurance, GMC, Balance, Decreased knowledge of precautions, Sensation, IADL, Flexibility, Decreased knowledge of use of DME, Body  mechanics, Cardiopulmonary status limiting activity, Dexterity, FMC, Strength Cognitive Skills: Attention, Energy/Drive, Memory, Safety Awareness, Problem Solve, Perception, Orientation, Sequencing     Visit Diagnosis: Unsteadiness on feet  Attention and concentration deficit  Muscle weakness (generalized)    Problem List Patient Active Problem List   Diagnosis Date Noted  . Labile blood glucose   . 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 OSA (obstructive sleep apnea)   . Controlled type 2 diabetes mellitus with hyperglycemia, without long-term  current use of insulin ([place])   . CVA (cerebral vascular accident) ([place]) 07/16/2019  . Dyslipidemia   . Benign essential HTN   . Diabetes mellitus type 2 in obese ([place])   . Major depressive disorder, recurrent episode, moderate ([place])   . Gait disorder   . Acute right MCA stroke ([place]) 06/28/2019  . Ambulatory dysfunction   . Stroke ([place]) 06/24/2019  . Type 2 diabetes mellitus ([place]) 10/04/2018  . Depression 10/04/2018  . Acute CVA (cerebrovascular accident) ([place]) 09/18/2017  . Stroke (cerebrum) ([place]) 09/17/2017  . Diabetes mellitus without complication ([place]) 37/90/2409  . Closed head injury 04/22/2013  . HLD (hyperlipidemia) 07/29/2009  . Essential hypertension 07/29/2009  . EFFUSION, PLEURAL 07/29/2009  . DIVERTICULITIS, COLON 07/29/2009  . 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 COLONIC POLYPS, HYPERPLASTIC, HX OF 07/29/2009    Mariah Milling, OTR/L 12/27/2019, 2:01 PM  [facility] [address] [place], [place], [address] Phone: [phone]   Fax:  [phone]  Name: MARVON SHILLINGBURG MRN: [id number] Date of Birth: 1941-01-24

## 2020-01-01 ENCOUNTER — Encounter: Payer: Self-pay | Admitting: Adult Health

## 2020-01-01 ENCOUNTER — Other Ambulatory Visit: Payer: Self-pay

## 2020-01-01 ENCOUNTER — Ambulatory Visit: Payer: Medicare HMO | Admitting: Occupational Therapy

## 2020-01-01 ENCOUNTER — Ambulatory Visit (INDEPENDENT_AMBULATORY_CARE_PROVIDER_SITE_OTHER): Payer: Medicare HMO | Admitting: Adult Health

## 2020-01-01 ENCOUNTER — Encounter: Payer: Self-pay | Admitting: Occupational Therapy

## 2020-01-01 VITALS — BP 147/80 | HR 73 | Ht 72.0 in | Wt 220.0 lb

## 2020-01-01 DIAGNOSIS — M6281 Muscle weakness (generalized): Secondary | ICD-10-CM

## 2020-01-01 DIAGNOSIS — R41840 Attention and concentration deficit: Secondary | ICD-10-CM

## 2020-01-01 DIAGNOSIS — F0151 Vascular dementia with behavioral disturbance: Secondary | ICD-10-CM

## 2020-01-01 DIAGNOSIS — F01518 Vascular dementia, unspecified severity, with other behavioral disturbance: Secondary | ICD-10-CM

## 2020-01-01 DIAGNOSIS — Z9989 Dependence on other enabling machines and devices: Secondary | ICD-10-CM

## 2020-01-01 DIAGNOSIS — F329 Major depressive disorder, single episode, unspecified: Secondary | ICD-10-CM

## 2020-01-01 DIAGNOSIS — Z8673 Personal history of transient ischemic attack (TIA), and cerebral infarction without residual deficits: Secondary | ICD-10-CM | POA: Diagnosis not present

## 2020-01-01 DIAGNOSIS — R2681 Unsteadiness on feet: Secondary | ICD-10-CM

## 2020-01-01 DIAGNOSIS — G4733 Obstructive sleep apnea (adult) (pediatric): Secondary | ICD-10-CM

## 2020-01-01 MED ORDER — MEMANTINE HCL 10 MG PO TABS: 10.0000 mg | ORAL_TABLET | Freq: Two times a day (BID) | ORAL | 6 refills | Status: DC

## 2020-01-01 MED ORDER — MEMANTINE HCL 28 X 5 MG & 21 X 10 MG PO TABS: ORAL_TABLET | ORAL | 0 refills | Status: DC

## 2020-01-01 NOTE — Patient Instructions (Signed)
Recommend starting Namenda on titration schedule as prescribed. This can potentially benefit in regards to memory and sometimes with anxiety associated with memory loss  Order will be placed to [facility] to mask refitting - untreated sleep apnea increases your risk of reoccurring strokes, high blood pressure, diabetes, cholesterol, and worsening memory  Continue clopidogrel 75 mg daily  and atorvastatin  for secondary stroke prevention  Continue to follow up with PCP regarding cholesterol and blood pressure management  Maintain strict control of hypertension with blood pressure goal below 130/90 and cholesterol with LDL cholesterol (bad cholesterol) goal below 70 mg/dL.       Followup in the future with me in 6 months or call earlier if needed       Thank you for coming to see [place] at [facility]. I hope we have been able to provide you high quality care today.  You may receive a patient satisfaction survey over the next few weeks. We would appreciate your feedback and comments so that we may continue to improve ourselves and the health of our patients.

## 2020-01-01 NOTE — Progress Notes (Signed)
[facility] [address] [place]. [place] [address] [phone]       OFFICE FOLLOW UP NOTE  [name] Date of Birth:  [birth year] Medical Record Number:  [id number]   [facility] stroke provider: Dr. Arvin Collard [name] Reason for Referral: stroke follow up and CPAP compliance visit    CHIEF COMPLAINT:  Chief Complaint  Patient presents with  . Follow-up    rm 9, with wife, hx of stroke, c/o right leg weakness, leg drags, no complaints of numbness, uses walker    HPI: Stroke admission 10/03/2018: [name] is a 79 y.o. male with history of hypertension male with history of hypertension, hyperlipidemia, diabetes, prostate cancer, TBI, OSA on CPAP and stroke 1 year ago  presented to [facility] ED on 10/03/2018 for confusion, slurred speech.  Dr. Erlinda Hong and stroke team consulted with stroke work-up showing left CR infarct as evidenced on MRI likely due to small vessel disease source with resultant slight dysarthria.  No tPA given due to outside window.  MRI also showed evidence of chronic left basal ganglia encephalomalacia.  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  2D echo unremarkable.  LDL 48 and A1c 6.9.  Recommended DAPT for 3 months and Plavix alone given left M2 high-grade stenosis.  Previously on Plavix but admitted noncompliance for several days prior.  HTN stable.  Controlled DM with satisfactory A1c.  HLD stable.  Other stroke risk factors include advanced age and OSA on CPAP (new diagnosis in 02/2018).  Discharged home in stable condition with recommendation of outpatient therapies.  Initial visit 11/07/2018: [name] is being seen today for stroke follow-up.  He was last seen in this office for stroke follow-up on 04/06/2018 for follow-up regarding prior stroke with residual cognitive deficits.  Since recent stroke, residual deficits of ongoing cognitive deficits but wife denies worsening. No residual speech difficulties.   He has also been having ongoing difficulties with ambulation and dragging of right foot which has been present since prior stroke.  Home Speech therapy will be completed at the end of this week. He will also be completing home PT/OT soon. Per wife, noncompliance of medications for 2 months prior to recent stroke.  He does endorse compliance with current medication regimen.  He continues on aspirin 325mg  daily and Plavix without bleeding or bruising. Continues on atorvastatin without myalgias. .  Blood pressure today 139/73.  He subjectively endorses compliance with CPAP for OSA management but wife states he has not been great with ongoing compliance.  He is initially having difficulties with worsening mood and irritability but PCP increased citalopram dose from 10 mg to 20 mg with improvement.  No further concerns at this time.  Update 02/07/2019: [name] is a 79 year old male who is being seen today for stroke and CPAP follow-up who is being seen today for stroke and CPAP follow-up. Stroke - residual deficits include cognitive impairment which has been stable without worsening.  MMSE today 21/30.  Wife does endorse increased emotional outbursts or irritability/agitation over the past couple months.  He is currently on Celexa 10 mg daily initially working well for her overall mood and irritability but wife is questioning possible change in therapy.  Continues on Plavix and aspirin despite 3 months recommendation without bleeding or bruising.  Continues on atorvastatin without myalgias.  Blood pressure today 158/78 but does monitor at home and typically lower.  Denies new or worsening stroke/TIA symptoms.  OSA on CPAP -compliance report from 01/07/2019 -02/05/2019 shows 27 out  of 30 usage days for 90% compliance and 76.7% of days with usage greater than 4 hours with average usage 5 hours and 28 minutes.  Residual AHI 2.3 on a set pressure of 11 cm H2O with EPR level 2.  He has been tolerating CPAP well without difficulty.  Continues to receive supplies and  follow-up with aero care as needed.  He does endorse improvement of overall fatigue and energy level.     Update 09/17/2019 Dr. Leonie Man: [name] is a 80 year old Caucasian male seen today for initial office follow-up visit from hospital admissions for stroke in May 2021 seen today for initial office follow-up visit from hospital admissions for stroke in May 2021.  Is accompanied by his wife.  His history is obtained from them, review of electronic medical records and I personally reviewed imaging films in PACS.  He has past medical history of hypertension, diabetes, hyperlipidemia, depression, intracranial hemorrhage and prior strokes who was admitted recently twice to [facility] on 06/24/2019 initially with 2 small acute infarcts in the right MCA in the centrum semiovale as well as posterior corona radiata.  Patient refused to go to inpatient rehab and returned home as he did well and had no deficits.  He returned back to the hospital on 07/16/2019 with new right leg weakness.  MRI at this time showed a left corona radiator centrum semiovale infarct with expected evolution of previous recent infarcts.  In the prior admissions he had work-up for cryptogenic stroke which included a TEE as well as a loop recorder which negative for paroxysmal A. fib.  LDL cholesterol was 48 mg percent.  Hemoglobin A1c was 7.2.  Echocardiogram showed normal ejection fraction.  He was discharged on dual antiplatelet therapy of aspirin and Plavix as MRI of the brain and neck had revealed multifocal intracranial atherosclerotic changes.  Patient is currently at home getting home physical and occupational therapy but the wife feels he is depressed and walks very little only with the therapist.  He can use a walker but drags his right leg.  His stamina is poor and tires easily after walking only 7-8 steps.  He also gets angry and does not like his wife telling him what to do.  His blood pressures well controlled today it is 132/79.  His fasting sugars have all been good.  Is tolerating aspirin and Plavix well without  bruising or bleeding.  He has been using his CPAP at night.  He has been started on Zoloft 25 mg by his primary care physician for the last 3 to 4 weeks.  Is tolerating well without side effects.  He has not tried increasing the dose.     Update: Today, 01/01/2020, [name] returns for stroke follow-up accompanied by his wife.  Continues to have gait impairment, RLE weakness and moderate to severe cognitive impairment.  Requested discharge from OT today due to lack of progress.  Continues to work with SLP and PT.  Denies new or worsening stroke/TIA symptoms.  PCP recently increased sertraline to 100 mg daily for continued outbursts and depression/anxiety.  Wife reports he is sedentary during the day with minimal ambulation.  She is his primary caregiver.  Remains on Plavix and atorvastatin for secondary stroke prevention without side effects.  Blood pressure today 147/80.  Reports noncompliance with his CPAP since June due to difficulty tolerating mask.  Most recent compliance report that we are able to obtain was from 07/28/2019 -08/26/2019 showing 21 out of 30 usage days for 70% compliance with average usage 8 hours and 18 minutes.  Residual AHI 2.0 on  set pressure 11 cm H2O and C-Flex setting 2.  No further concerns at this time.       ROS:   14 system review of systems performed and negative with exception of those listed in HPI  PMH:  Past Medical History:  Diagnosis Date  . Brain bleed ([place])   . Cancer [facility])    prostate  . 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 Depression   . Diabetes mellitus without complication ([place])   . High cholesterol   . Hypertension   . Stroke [facility])     PSH:  Past Surgical History:  Procedure Laterality Date  . arm & hand surgery Left 1972   skill saw accident  . COLON SURGERY     partial  . EYE SURGERY     lens implant and cateract- Left  . PROSTATECTOMY      Social History:  Social History   Socioeconomic History  . Marital status:  Married    Spouse name: Not on file  . Number of children: 1  . Years of education: Not on file  . Highest education level: High school graduate  Occupational History  . Not on file  Tobacco Use  . Smoking status: Never Smoker  . Smokeless tobacco: Never Used  Vaping Use  . Vaping Use: Never used  Substance and Sexual Activity  . Alcohol use: Never  . Drug use: Never  . Sexual activity: Not Currently  Other Topics Concern  . Not on file  Social History Narrative   Lives at home with his wife   Right handed   Caffeine: soft drinks 2-3 cups daily   Social Determinants of Health   Financial Resource Strain:   . Difficulty of Paying Living Expenses: Not on file  Food Insecurity:   . Worried About Charity fundraiser in the Last Year: Not on file  . Ran Out of Food in the Last Year: Not on file  Transportation Needs:   . Lack of Transportation (Medical): Not on file  . Lack of Transportation (Non-Medical): Not on file  Physical Activity:   . Days of Exercise per Week: Not on file  . Minutes of Exercise per Session: Not on file  Stress:   . Feeling of Stress : Not on file  Social Connections:   . Frequency of Communication with Friends and Family: Not on file  . Frequency of Social Gatherings with Friends and Family: Not on file  . Attends Religious Services: Not on file  . Active Member of Clubs or Organizations: Not on file  . Attends Archivist Meetings: Not on file  . Marital Status: Not on file  Intimate Partner Violence:   . Fear of Current or Ex-Partner: Not on file  . Emotionally Abused: Not on file  . Physically Abused: Not on file  . Sexually Abused: Not on file    Family History:  Family History  Problem Relation Age of Onset  . CAD Mother   . Diabetes Mother   . Emphysema Father   . Diabetes Sister   . Diabetes Brother   . Diabetes Sister   . Diabetes Brother   . Diabetes Mellitus II Neg Hx     Medications:   Current Outpatient  Medications on File Prior to Visit  Medication Sig Dispense Refill  . acetaminophen (TYLENOL) 325 MG tablet Take 2 tablets (650 mg total) by mouth every 6 (six) hours as needed for mild pain (  or Fever >/= 101).    Marland Kitchen amLODipine (NORVASC) 5 MG tablet Take 1 tablet (5 mg total) by mouth daily. 30 tablet 0  . atorvastatin (LIPITOR) 80 MG tablet Take 1 tablet (80 mg total) by mouth daily. 90 tablet 0  . Cholecalciferol (VITAMIN D-1000 MAX ST) 25 MCG (1000 UT) tablet Take 1 tablet (1,000 Units total) by mouth daily. 30 tablet 0  . clopidogrel (PLAVIX) 75 MG tablet Take 1 tablet (75 mg total) by mouth daily. 90 tablet 4  . cyanocobalamin ([employer] VITAMIN B12) 1000 MCG tablet Take 1 tablet (1,000 mcg total) by mouth daily. 30 tablet 0  . fenofibrate 160 MG tablet Take 1 tablet (160 mg total) by mouth daily. 30 tablet 33  . glipiZIDE (GLUCOTROL) 5 MG tablet Take 0.5 tablets (2.5 mg total) by mouth daily before breakfast. 30 tablet 0  . hydrochlorothiazide (HYDRODIURIL) 25 MG tablet Take 1 tablet (25 mg total) by mouth daily. 30 tablet 0  . metFORMIN (GLUCOPHAGE) 500 MG tablet Take 1 tablet (500 mg total) by mouth 2 (two) times daily with a meal. 60 tablet 0  . Omega-3 Fatty Acids (FISH OIL) 1000 MG CAPS Take 1,000 mg by mouth daily with breakfast.    . polyvinyl alcohol (LIQUIFILM TEARS) 1.4 % ophthalmic solution Place 1 drop into both eyes 4 (four) times daily as needed for dry eyes.    . Semaglutide (OZEMPIC) 0.25 or 0.5 MG/DOSE SOPN Inject 0.25 mg into the skin once a week.    . senna-docusate (SENOKOT-S) 8.6-50 MG tablet Take 2 tablets by mouth at bedtime.    . sertraline (ZOLOFT) 25 MG tablet TAKE 2 TABLETS BY MOUTH EVERY DAY (Patient taking differently: 100 mg. Takes 1 tab a day) 180 tablet 1   No current facility-administered medications on file prior to visit.    Allergies:  No Known Allergies   Physical Exam  Vitals:   01/01/20 1520  BP: (!) 147/80  Pulse: 73  Weight: 220 lb (99.8 kg)   Height: 6' (1.829 m)   Body mass index is 29.84 kg/m. No exam data present  General: well developed, well nourished,  pleasant elderly Caucasian male, seated, in no evident distress Head: head normocephalic and atraumatic.   Neck: supple with no carotid or supraclavicular bruits Cardiovascular: regular rate and rhythm, no murmurs Musculoskeletal: no deformity Skin:  no rash/petichiae Vascular:  Normal pulses all extremities   Neurologic Exam Mental Status: Awake and fully alert. Oriented to time but disoriented to location. Recent and remote memory diminished. Attention span, concentration and fund of knowledge diminished. Mood and affect appropriate and cooperative with exam.  MMSE - Mini Mental State Exam 01/01/2020 02/07/2019  Orientation to time 4 3  Orientation to Place 5 4  Registration 3 3  Attention/ Calculation 0 1  Recall 0 1  Language- name 2 objects 2 2  Language- repeat 1 1  Language- follow 3 step command 2 3  Language- read & follow direction 1 1  Write a sentence 0 1  Copy design 1 1  Copy design-comments 6 animals named 6 animals  Total score 19 21   Cranial Nerves: Pupils equal, briskly reactive to light. Extraocular movements full without nystagmus. Visual fields full to confrontation. Hearing intact. Facial sensation intact. Face, tongue, palate moves normally and symmetrically.  Motor: Normal bulk and tone. Normal strength in all tested extremity muscles except mild left grip weakness and right hip flexor weakness and right ankle dorsiflexion weakness. Sensory.: intact to touch ,  pinprick , position and vibratory sensation.  Coordination: Rapid alternating movements normal in all extremities except slightly decreased left hand. Finger-to-nose performed accurately bilaterally and heel-to-shin difficulty performing Gait and Station: Deferred as rolling walker not present during the Reflexes: 1+ and symmetric. Toes downgoing.       ASSESSMENT: ASH MCELWAIN is a pleasant 79 year old Caucasian male with recurrent left MCA small infarcts in setting of recent right MCA ACA infarcts in May 2021 likely from small vessel disease with known prior history of large vessel disease as well as cryptogenic strokes in August 2020 and July 2019.  Multiple vascular risk factors of diabetes, hypertension, hyperlipidemia, peripheral arterial disease, obstructive sleep apnea with CPAP noncompliance, and intracranial atherosclerosis.  He also has poststroke depression   PLAN:  1. Multiple recurrent strokes:  a. Residual deficits: Moderate to severe cognitive impairment, gait impairment, RLE weakness and decreased left hand dexterity.  Completed OT today due to lack of progress.  Continues to work with PT and SLP.   b. Continue clopidogrel 75 mg daily  and atorvastatin for secondary stroke prevention.   c. Discussed secondary stroke prevention measures and importance of close PCP follow-up for aggressive stroke risk factor management with HTN with BP goal<130/90, HLD with LDL goal<70 and DM with A1c goal<7 2. Vascular cognitive impairment with behaviors:  a. MMSE today 19/30 (prior 21/30 01/2019) b. Per wife, worsened after recent strokes c. Recommend trial Namenda titration pack for possible benefit of cognitive impairment and associated anxiety 3. Post stroke depression/anxiety:  a. PCP recently increased sertraline to 100 mg daily  b. Advised continuation at this time 4. OSA:  a. Noncompliance since beginning of July due to difficulty tolerating current CPAP mask.  Ongoing ability to remain compliant with CPAP use due to cognitive impairment but he is willing to trial a different mask.  Order placed to [facility] for mask refitting b. Discussed importance of nightly use of CPAP in setting of recurrent strokes, cardiovascular disease, cognitive impairment and continued depression/anxiety    Follow-up in 6 months or call earlier if needed   CC:   [facility] provider: Dr. Lloyd Huger, Rebeca Alert, MD    I spent 30 minutes of face-to-face and non-face-to-face time with patient and wife.  This included previsit chart review, lab review, study review, order entry, electronic health record documentation, patient education and discussion regarding recurrent strokes with residual deficits, vascular cognitive impairment, poststroke depression/anxiety, importance of CPAP use for sleep apnea management, importance of managing stroke risk factors and answered all questions to patient and wife satisfaction    Frann Rider, AGNP-BC  [facility] [address] [place], [place] [address]  Phone [phone] Fax [phone] Note: This document was prepared with digital dictation and possible smart phrase technology. Any transcriptional errors that result from this process are unintentional.    I reviewed the above note and documentation by the Nurse Practitioner and agree with the history, exam, assessment and plan as outlined above. I was available for consultation. Star Age, MD, PhD [facility])

## 2020-01-01 NOTE — Therapy (Signed)
[facility] [address] [place], [place], [address] Phone: [phone]   Fax:  [phone]  Occupational Therapy Treatment and Discharge Summary  Patient Details  Name: Justin [name] MRN: [id number] Date of Birth: June 14, 1940 No data recorded  Encounter Date: 01/01/2020   OT End of Session - 01/01/20 1452    Visit Number 8    Number of Visits 13    Authorization Type [employer] Medicare:  Ot auth 11/06/19-12/24/19  for 13 visits, covered 100%    Authorization - Visit Number 8    Authorization - Number of Visits 13    Progress Note Due on Visit 10    OT Start Time 1400    OT Stop Time 1430    OT Time Calculation (min) 30 min           Past Medical History:  Diagnosis Date  . Brain bleed ([place])   . Cancer [facility])    prostate  . 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 Depression   . Diabetes mellitus without complication ([place])   . High cholesterol   . Hypertension   . Stroke [facility])     Past Surgical History:  Procedure Laterality Date  . arm & hand surgery Left 1972   skill saw accident  . COLON SURGERY     partial  . EYE SURGERY     lens implant and cateract- Left  . PROSTATECTOMY      There were no vitals filed for this visit.   Subjective Assessment - 01/01/20 1419    Subjective  [name] discussing discharge from therapy    Patient is accompanied by: Family member    Pertinent History per wife patient has had 5 strokes, last two in May 2021    Limitations Cognition    Currently in Pain? No/denies    Pain Score 0-No pain                        OT Treatments/Exercises (OP) - 01/01/20 0001      ADLs   ADL Comments [name] came to therapy session today.  [name] expressing sadness and frustration that she sees little change from current therapy program.  She is requesting discharge from therapy.  Patient present for this conversation, and verbally agreed with  decision, although his level of understanding is questionable.  Long discussion about wife regarding respite care, home health services.  Wife still interested in discussing medication options that may help with attention.                    OT Education - 01/01/20 1451    Education Details reviewed remaining goals with patient and wife and discussed early discharge    Person(s) Educated Patient;Spouse    Methods Explanation    Comprehension Verbalized understanding;Need further instruction            OT Short Term Goals - 01/01/20 1420      OT SHORT TERM GOAL #1   Title Patient and wife will compile a list of activities patient can safely participate in at home - e.g. making sandwich, picking out clothing, etc. due 12/21/19    Time 4    Period Weeks    Status Achieved      OT SHORT TERM GOAL #2   Title Patient will demonstrate ability to attend for 5 min or more to a familiar activity  in minimally distracting environment    Time 4    Period Weeks    Status Achieved      OT SHORT TERM GOAL #3   Title Patient will complete a home exercise program to address upper body active movement and general strength with min cueing and set up    Time 4    Period Weeks    Status Not Met             OT Long Term Goals - 01/01/20 1422      OT LONG TERM GOAL #1   Title Patient will complete a home activities rogram designed to help him safely engage in IADL with minimal prompting    Time 6    Period Weeks    Status Not Met      OT LONG TERM GOAL #2   Title Patient will demonstrate sufficient standing balance to stand at counter top to complete a familiar functional task - eg prepare a snack    Time 6    Period Weeks    Status Achieved      OT LONG TERM GOAL #3   Title Patient will follow a daily schedule to outline sleep and wake times, meal times, and activity times to reduce amount of idol TV watching time during day.    Time 6    Period Weeks    Status Not Met                  Plan - 01/01/20 1453    Clinical Impression Statement Patient continues to require near constant cueing for safety with all ambulation.  [name] expressing frustration that patient"does nothing" at home. Had long discussion that patient does nothave current cognitive capacity for awareness, insight and initiation.  Discussed early discharge from OT as patient with minimal carryover to home setting.  Wife in agreement.  Encouraged wife to consider home care - and she indictaes she is currently looking into respite care.    OT Occupational Profile and History Detailed Assessment- Review of Records and additional review of physical, cognitive, psychosocial history related to current functional performance    Occupational performance deficits (Please refer to evaluation for details): ADL's;IADL's    Body Structure / Function / Physical Skills ADL;Coordination;Endurance;GMC;Balance;Decreased knowledge of precautions;Sensation;IADL;Flexibility;Decreased knowledge of use of DME;Body mechanics;Cardiopulmonary status limiting activity;Dexterity;FMC;Strength    Cognitive Skills Attention;Energy/Drive;Memory;Safety Awareness;Problem Solve;Perception;Orientation;Sequencing    Rehab Potential Fair    Clinical Decision Making Limited treatment options, no task modification necessary    Comorbidities Affecting Occupational Performance: May have comorbidities impacting occupational performance    Modification or Assistance to Complete Evaluation  Min-Moderate modification of tasks or assist with assess necessary to complete eval    OT Frequency 2x / week    OT Duration 6 weeks    OT Treatment/Interventions Self-care/ADL training;Therapeutic exercise;Neuromuscular education;Patient/family education;Balance training;Therapeutic activities;Functional Mobility Training;Cognitive remediation/compensation;DME and/or AE instruction    Plan discharge    Consulted and Agree with Plan of Care  Patient    Family Member Consulted wife Justin [name]           Patient will benefit from skilled therapeutic intervention in order to improve the following deficits and impairments:   Body Structure / Function / Physical Skills: ADL, Coordination, Endurance, GMC, Balance, Decreased knowledge of precautions, Sensation, IADL, Flexibility, Decreased knowledge of use of DME, Body mechanics, Cardiopulmonary status limiting activity, Dexterity, FMC, Strength Cognitive Skills: Attention, Energy/Drive, Memory, Safety Awareness, Problem Solve, Perception, Orientation, Sequencing  Visit Diagnosis: Unsteadiness on feet  Attention and concentration deficit  Muscle weakness (generalized)    Problem List Patient Active Problem List   Diagnosis Date Noted  . Labile blood glucose   . 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 OSA (obstructive sleep apnea)   . Controlled type 2 diabetes mellitus with hyperglycemia, without long-term current use of insulin ([place])   . CVA (cerebral vascular accident) ([place]) 07/16/2019  . Dyslipidemia   . Benign essential HTN   . Diabetes mellitus type 2 in obese ([place])   . Major depressive disorder, recurrent episode, moderate ([place])   . Gait disorder   . Acute right MCA stroke ([place]) 06/28/2019  . Ambulatory dysfunction   . Stroke ([place]) 06/24/2019  . Type 2 diabetes mellitus ([place]) 10/04/2018  . Depression 10/04/2018  . Acute CVA (cerebrovascular accident) ([place]) 09/18/2017  . Stroke (cerebrum) ([place]) 09/17/2017  . Diabetes mellitus without complication ([place]) 30/10/2328  . Closed head injury 04/22/2013  . HLD (hyperlipidemia) 07/29/2009  . Essential hypertension 07/29/2009  . EFFUSION, PLEURAL 07/29/2009  . DIVERTICULITIS, COLON 07/29/2009  . 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 COLONIC POLYPS, HYPERPLASTIC, HX OF 07/29/2009  OCCUPATIONAL THERAPY DISCHARGE SUMMARY  Visits from Start of Care: 8  Current functional level related to goals / functional  outcomes: Able to bathe and dress self without assistance.     Remaining deficits: Needs supervision due to impaired cognition  Incontinent   Education / Equipment: Education regarding limitations of cognition, and safe familiar functional tasks Plan: Patient agrees to discharge.  Patient goals were not met. Patient is being discharged due to lack of progress.  ?????       Mariah Milling, OTR/L 01/01/2020, 2:57 PM  [facility] [address] [place], [place], [address] Phone: [phone]   Fax:  [phone]  Name: Justin [name] MRN: [id number] Date of Birth: 1940/07/25

## 2020-01-01 NOTE — Progress Notes (Signed)
I agree with the above plan 

## 2020-01-03 ENCOUNTER — Ambulatory Visit: Payer: Medicare HMO | Admitting: Speech Pathology

## 2020-01-03 ENCOUNTER — Ambulatory Visit: Payer: Medicare HMO | Admitting: Occupational Therapy

## 2020-01-03 ENCOUNTER — Ambulatory Visit: Payer: Medicare HMO

## 2020-01-03 NOTE — Therapy (Signed)
[facility] [address] [place], [place], [address] Phone: [phone]   Fax:  [phone]  Patient Details  Name: Justin [name] MRN: [id number] Date of Birth: 12/28/1940 Referring Provider:  No ref. provider found  Encounter Date: 01/03/2020   PHYSICAL THERAPY DISCHARGE SUMMARY  Visits from Start of Care: 13  Current functional level related to goals / functional outcomes: [name] frustrated with minimal progress noted from current therapy programs, and is requesting discharge from all therapy services at this time.    Remaining deficits: Decreased Balance, Impaired Cognition, High Fall Risk   Education / Equipment: HEP  Plan: Patient agrees to discharge.  Patient goals were not met. Patient is being discharged due to lack of progress.  ?????          PT Short Term Goals - 12/11/19 1518      PT SHORT TERM GOAL #1   Title = LTGs           PT Long Term Goals - 12/11/19 1519      PT LONG TERM GOAL #1   Title Patient will be independent with final HEP and walking 3x/daily in the home for improved strength and mobility (ALL LTGs Due: 01/22/20)    Baseline reports completing HEP few times weekly.    Time 4    Period Weeks    Status Revised    Target Date 01/22/20   date extended due to scheduling     PT LONG TERM GOAL #2   Title Pt will improve gait velocity to at least 1.5 ft/sec for decreased fall risk and improved gait efficiency.    Baseline 0.99 ft/sec    Time 4    Period Weeks    Status Revised      PT LONG TERM GOAL #3   Title Pt will improve BERG to at least a 41/56 to demo decr fall risk.    Baseline 36/56, 39/36    Time 4    Period Weeks    Status Revised      PT LONG TERM GOAL #4   Title Patient will improve TUG score to <20 seconds to demosntrate improved mobility and reduced fall risk    Baseline 31.57 secs, 23.28 secs w/ RW    Time 4    Period Weeks    Status Revised        PT LONG TERM GOAL #5   Title Patient will improved 5x sit <> stand to </= 20 seconds to demonstrate improved functional mobility    Baseline 31.34 secs, 25.16 secs w/ UE support    Time 4    Period Weeks    Status Revised      PT LONG TERM GOAL #6   Title Patient and wife will report understanding of fall prevention within the home    Baseline currently dependent, unable to assess due to time    Time 8    Period Weeks    Status On-going            Jones Bales, PT, DPT 01/03/2020, 4:39 PM  [facility] [address] [place], [place], [address] Phone: [phone]   Fax:  [phone]

## 2020-01-07 NOTE — Progress Notes (Signed)
Community message sent for cpap orders

## 2020-01-08 ENCOUNTER — Encounter: Payer: Medicare HMO | Admitting: Occupational Therapy

## 2020-01-08 ENCOUNTER — Ambulatory Visit: Payer: Medicare HMO

## 2020-01-10 ENCOUNTER — Encounter: Payer: Medicare HMO | Admitting: Occupational Therapy

## 2020-01-10 ENCOUNTER — Ambulatory Visit: Payer: Medicare HMO | Admitting: Physical Therapy

## 2020-01-15 ENCOUNTER — Ambulatory Visit: Payer: Medicare HMO

## 2020-01-15 ENCOUNTER — Encounter: Payer: Medicare HMO | Admitting: Occupational Therapy

## 2020-01-22 ENCOUNTER — Encounter: Payer: Medicare HMO | Admitting: Occupational Therapy

## 2020-01-22 ENCOUNTER — Ambulatory Visit: Payer: Medicare HMO | Admitting: Physical Therapy

## 2020-01-23 ENCOUNTER — Other Ambulatory Visit: Payer: Self-pay | Admitting: Adult Health

## 2020-01-24 ENCOUNTER — Encounter: Payer: Medicare HMO | Admitting: Occupational Therapy

## 2020-01-24 ENCOUNTER — Ambulatory Visit: Payer: Medicare HMO

## 2020-01-25 ENCOUNTER — Other Ambulatory Visit: Payer: Self-pay | Admitting: Adult Health

## 2020-02-27 ENCOUNTER — Telehealth: Payer: Self-pay

## 2020-02-27 NOTE — Telephone Encounter (Signed)
Attempted to contact patient to schedule a Palliative Care consult appointment. No answer left a message to return call.  

## 2020-03-07 ENCOUNTER — Telehealth: Payer: Self-pay

## 2020-03-07 NOTE — Telephone Encounter (Signed)
Attempted to contact patient's [name] to schedule a Palliative Care consult appointment. No answer left a message to return call.

## 2020-04-04 ENCOUNTER — Other Ambulatory Visit: Payer: Self-pay | Admitting: Nurse Practitioner

## 2020-04-04 ENCOUNTER — Other Ambulatory Visit: Payer: Self-pay

## 2020-04-29 ENCOUNTER — Other Ambulatory Visit: Payer: Self-pay

## 2020-04-29 ENCOUNTER — Other Ambulatory Visit: Payer: Self-pay | Admitting: Nurse Practitioner

## 2020-04-29 DIAGNOSIS — R531 Weakness: Secondary | ICD-10-CM

## 2020-04-29 DIAGNOSIS — Z515 Encounter for palliative care: Secondary | ICD-10-CM

## 2020-04-29 NOTE — Progress Notes (Signed)
[facility] Consult Note Telephone: [phone]  Fax: [phone]  PATIENT NAME: Justin [name] [id number] [phone] (home)  DOB: [birth year] MRN: [id number]  PRIMARY CARE PROVIDER:    Chesley Noon, MD,  [place] [address] [phone]  REFERRING PROVIDER:   Chesley Noon, MD [address] [place],  [place] [address] [phone]  RESPONSIBLE PARTY:   Extended Emergency Contact Information Primary Emergency Contact: Ladarren, Steiner Address: [place], [place] [address] [place] Phone: [phone] Mobile Phone: [phone] Relation: Spouse Secondary Emergency Contact: Baldomero, Mirarchi Mobile Phone: [phone] Relation: Son  I met face to face with patient and family in home.  ASSESSMENT AND RECOMMENDATIONS:   Advance Care Planning: Today's visit consisted of building trust and discussions on Palliative care medicine as a specialized medical care for people living with serious illness, aimed at facilitating improved quality of life through symptoms relief, assisting with advance care planning and establishing goals of care. Family expressed appreciation for education provided on Palliative care and how it differs from Hospice service. Palliative care will continue to provide support to patient, family and the medical team. Goal of care: Patient's goal of care function. Patient desire to gain enough function to be able to work in his yard. Wife believed he has lost will to do anything, even getting out of bed in the mornings for him take a lot.  Directives: Patient verbalized desire to not be resuscitated in the event of cardiac or respiratory arrest. DNR form signed for patient. Recommended placing it in a readily accessible location preferably on the fridge, where EMS would look for it should they be called. The need to  complete a MOST form was discussed, patient and wife expressed interest in completing a MOST form, they however want to discuss it with their son before signing it. Blank  MOST for left with patient and husband, we will complete it at next visit.  Symptom Management:  Weakness: Patient with history of multiple strokes, total of 5 so far. Wife report worsening decline in function and cognition since last stoke about a year ago. Patient is s/p intensive Rehab, wife report regression after discharge from therapy. Has episodes forgetfulness and decline in function, requires assistance getting in and out of bed. She however report patient with good appetite and sleeps well. She report patient loves to go outside but appears to have no zeal to do anything. Patient encouraged to participate in completing his ADLS as able, encouraged to spend time outside as the weather is becoming sunnier. Continue supportive care. Prevent falls, remove/limit throw rugs within the home. Consider repeat Physical therapy for balance and strengthening. Depression: Wife report patient not willing to participate in any activity. Patient denied depression today. Continue Zoloft 78m daily. Diabetes: blood sugar is controlled on current regimen. A1c 7.2, continue Metformin 5077mtwice daily and Glipizide 64m84maily.  Follow up Palliative Care Visit: Paliative care will continue to follow for complex decision making and symptom management. Return in about 4 weeks or prn.  Family /Caregiver/Community Supports: Patient is an [employer]He lives at home with his wife, who is his main caregiver. [facility] providing caregiver assistance for 13hrs a week . The a son together.   Cognitive / Functional decline: Patient awake and alert. Mildly confused. He is dependent on wife  for assistance with bathing and dressing, feeds self. Ambulates with a front wheel walker, wife report frequent falls.  I spent 50 minutes providing this  consultation, time includes time spent with patient/ and wife, chart review, provider coordination, and documentation. More than 50% of the time in this consultation was spent counseling and coordinating communication.   CHIEF COMPLAINT: Weakness  History obtained from review of EMR and  discussion with patient and wife. Records reviewed and summarized bellow.  HISTORY OF PRESENT ILLNESS:  Justin [name] is a 80 y.o. 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 Palliative Care was asked to follow this patient by consultation request of Chesley Noon, MD to help address advance care planning and goals of care. This is an initial visit.  CODE STATUS: DNR  PPS: 50%  HOSPICE ELIGIBILITY/DIAGNOSIS: TBD  ROS  Constitutional: denies fatigue, denies fever, denies chills EYES: denies vision changes ENMT: denies dysphagia Cardiovascular: denies chest pain, denies palpitation Pulmonary: denies cough, denies increased SOB Abdomen: endorses fair appetite, denies constipation, denies incontinence of bowel GU: denies dysuria, endorses incontinence of urine MSK:  endorses ROM limitations, report frequent fall Skin: denies rashes or wounds Neurological: endorses weakness, denies uncontrolled pain, denies insomnia Psych: Endorses positive mood Heme/lymph/immuno: denies bruises, abnormal bleeding   Physical Exam: Vital signs: BP 152/90, P 86, RR 16, 96% on roo air Constitutional: NAD General: frail appearing, obese EYES: anicteric sclera, lids intact, no discharge  ENMT: intact hearing, oral mucous membranes moist CV:  no LE edema Pulmonary: no increased work of breathing, no cough, no audible wheezes, room air Abdomen: no ascites GU: deferred MSK:  no contractures of LE,  ambulatory Skin: warm and dry, no rashes, mild bruising to left  Neuro: Generalized weakness, A and O x 2 Psych: non-anxious affect today Hem/lymph/immuno: no widespread bruising   PAST MEDICAL HISTORY:  Past Medical History:  Diagnosis Date  . Brain bleed ([place])   . Cancer [facility])    prostate  . 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 Depression   . Diabetes mellitus without complication ([place])   . High cholesterol   . Hypertension   . Stroke [facility])     SOCIAL HX:  Social History   Tobacco Use  . Smoking status: Never Smoker  . Smokeless tobacco: Never Used  Substance Use Topics  . Alcohol use: Never   FAMILY HX:  Family History  Problem Relation Age of Onset  . CAD Mother   . Diabetes Mother   . Emphysema Father   . Diabetes Sister   . Diabetes Brother   . Diabetes Sister   . Diabetes Brother   . Diabetes Mellitus II Neg Hx     ALLERGIES: No Known Allergies   PERTINENT MEDICATIONS:  Outpatient Encounter Medications as of 04/29/2020  Medication Sig  . acetaminophen (TYLENOL) 325 MG tablet Take 2 tablets (650 mg total) by mouth every 6 (six) hours as needed for mild pain (or Fever >/= 101).  Marland Kitchen amLODipine (NORVASC) 5 MG tablet Take 1 tablet (5 mg total) by mouth daily.  Marland Kitchen atorvastatin (LIPITOR) 80 MG tablet Take 1 tablet (80 mg total) by mouth daily.  . Cholecalciferol (VITAMIN D-1000 MAX ST) 25 MCG (1000 UT) tablet Take 1 tablet (1,000 Units total)  by mouth daily.  . clopidogrel (PLAVIX) 75 MG tablet Take 1 tablet (75 mg total) by mouth daily.  . cyanocobalamin ([employer] VITAMIN B12) 1000 MCG tablet Take 1 tablet (1,000 mcg total) by mouth daily.  . fenofibrate 160 MG tablet Take 1 tablet (160 mg total) by mouth daily.  Marland Kitchen glipiZIDE (GLUCOTROL) 5 MG tablet Take 0.5 tablets (2.5 mg total) by mouth daily before breakfast.  . hydrochlorothiazide (HYDRODIURIL) 25 MG tablet Take 1 tablet (25 mg total) by mouth daily.  . memantine (NAMENDA TITRATION PAK) tablet pack 5  mg/day for =1 week; 5 mg twice daily for =1 week; 15 mg/day given in 5 mg and 10 mg separated doses for =1 week; then 10 mg twice daily  . memantine (NAMENDA) 10 MG tablet TAKE 1 TABLET BY MOUTH TWICE A DAY  . metFORMIN (GLUCOPHAGE) 500 MG tablet Take 1 tablet (500 mg total) by mouth 2 (two) times daily with a meal.  . Omega-3 Fatty Acids (FISH OIL) 1000 MG CAPS Take 1,000 mg by mouth daily with breakfast.  . polyvinyl alcohol (LIQUIFILM TEARS) 1.4 % ophthalmic solution Place 1 drop into both eyes 4 (four) times daily as needed for dry eyes.  . Semaglutide (OZEMPIC) 0.25 or 0.5 MG/DOSE SOPN Inject 0.25 mg into the skin once a week.  . senna-docusate (SENOKOT-S) 8.6-50 MG tablet Take 2 tablets by mouth at bedtime.  . sertraline (ZOLOFT) 25 MG tablet TAKE 2 TABLETS BY MOUTH EVERY DAY (Patient taking differently: 100 mg. Takes 1 tab a day)   No facility-administered encounter medications on file as of 04/29/2020.    Thank you for the opportunity to participate in the care of Mr. Brave Dack. The palliative care team will continue to follow. Please call our office at [phone] if we can be of additional assistance.  Jari Favre, DNP, AGPCNP-BC

## 2020-06-03 ENCOUNTER — Other Ambulatory Visit: Payer: Self-pay | Admitting: Nurse Practitioner

## 2020-06-03 ENCOUNTER — Other Ambulatory Visit: Payer: Self-pay

## 2020-06-03 DIAGNOSIS — R296 Repeated falls: Secondary | ICD-10-CM

## 2020-06-03 DIAGNOSIS — Z515 Encounter for palliative care: Secondary | ICD-10-CM

## 2020-06-03 NOTE — Progress Notes (Signed)
[place] Consult Note Telephone: [phone]  Fax: [phone]  PATIENT NAME: Justin [name] [id number] (home)  DOB: 12/26/1940 MRN: [id number]  PRIMARY CARE PROVIDER:    Chesley Noon, MD,  [place] [address] [phone]  REFERRING PROVIDER:   Chesley Noon, MD [address] [place],  [place] [address] [phone]  RESPONSIBLE PARTY:   Extended Emergency Contact Information Primary Emergency Contact: Cynthia, [place] Address: [place], [place] [address] Johnnette Litter of [place] Phone: [phone] Mobile Phone: [phone] Relation: Spouse Secondary Emergency Contact: Albi, [place] Mobile Phone: [phone] Relation: Son  I met face to face with patient and wife in home.  ASSESSMENT AND RECOMMENDATIONS:   Advance Care Planning: Goal of care: Patient's goal of care is function.  Directives: Patient's code status is DNR. Signed DNR form present in Home, copy on [facility] EMR.  Symptom Management/Plan: Falls: Wife declined offer to order physical therapy for strengthening and balance saying it has not helped in the past, as patient not cooperative with therapy. Recommend continuing supportive care, prevent falls. Offer assistance with ADLs as needed. Discussed and reviewed fall prevention practices in home, like wearing shoes when walking, installing grab bars, limiting use of area rugs. Encouraged consistent use of walker during ambulation.  Wife report receiving assistance with getting private care giver through the [place]. Patient sees Dr.Theresa Ouida Sills at [facility]. Hoping to get caregiver assistant from [facility] care agency with phone number [phone], Case worker at [facility] MSW [phone] Ext  Assisting with the processing. Provided general support and encouragement, no other unmet needs  identified at this time. Questions and concerns were addressed. Patient and family was encouraged to call with questions and/or concerns. Dementia: stable, continue current plan of care. Depression: stable. Patient denied depression. Continue current plan of care.  Follow up Palliative Care Visit: Palliative care will continue to follow for complex decision making and symptom management. Return in about 4 weeks or prn.  Family /Caregiver/Community Supports: Patient lives at home with wife who is his main caregiver.  CHIEF COMPLAINT: frequent falls  History obtained from review of Epic EMR and discussion with patient and his wife. I reviewed available labs, medications, imaging, studies and related documents from the EMR. Records reviewed and summarized bellow.   HISTORY OF PRESENT ILLNESS:  Justin [name] is a 80 y.o. 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 Family report patient with frequent falls related to gait instability, family report total of 7 mechanical falls in the last 5 weeks. No report of significant injury from the falls. Falls not associated with any symptoms, patient has tried physical therapy in the past without improvement in condition. Wife however report improvement in physical function in the last week, with patient walking more, and more consistent in using his walker. Patient denied any pain, denied dizziness, denied SOB, endorsed feeling well overall. Palliative Care was asked to follow this patient by consultation request of Chesley Noon, MD to help address advance care planning, goals of care and with symptoms management. This is a follow up visit from 04/29/2020.  CODE  STATUS: DNR  PPS: 50%  HOSPICE ELIGIBILITY/DIAGNOSIS:  TBD  Physical Exam: Constitutional: NAD General: frail appearing, obese EYES: anicteric sclera, no discharge  ENMT: intact hearing, oral mucous membranes moist CV: no LE edema Pulmonary: no increased work of breathing, no cough, no audible wheezes, room air Abdomen:no ascites GU: deferred MSK: no contractures, ambulatory Skin: warm and dry, no rashes, mild bruising to right ring finger Neuro: Generalized weakness,A and O x 2 Psych: non-anxious affecttoday Hem/lymph/immuno: no widespread bruising  PAST MEDICAL HISTORY:  Past Medical History:  Diagnosis Date  . Brain bleed ([place])   . Cancer [facility])    prostate  . 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 Depression   . Diabetes mellitus without complication ([place])   . High cholesterol   . Hypertension   . Stroke [facility])     SOCIAL HX:  Social History   Tobacco Use  . Smoking status: Never Smoker  . Smokeless tobacco: Never Used  Substance Use Topics  . Alcohol use: Never   FAMILY HX:  Family History  Problem Relation Age of Onset  . CAD Mother   . Diabetes Mother   . Emphysema Father   . Diabetes Sister   . Diabetes Brother   . Diabetes Sister   . Diabetes Brother   . Diabetes Mellitus II Neg Hx     ALLERGIES: No Known Allergies   PERTINENT MEDICATIONS:  Outpatient Encounter Medications as of 06/03/2020  Medication Sig  . acetaminophen (TYLENOL) 325 MG tablet Take 2 tablets (650 mg total) by mouth every 6 (six) hours as needed for mild pain (or Fever >/= 101).  Marland Kitchen amLODipine (NORVASC) 5 MG tablet Take 1 tablet (5 mg total) by mouth daily.  Marland Kitchen atorvastatin (LIPITOR) 80 MG tablet Take 1 tablet (80 mg total) by mouth daily.  . Cholecalciferol (VITAMIN D-1000 MAX ST) 25 MCG (1000 UT) tablet Take 1 tablet (1,000 Units total) by mouth daily.  . clopidogrel (PLAVIX) 75 MG tablet Take 1 tablet (75 mg total) by mouth daily.  . cyanocobalamin ([employer] VITAMIN B12) 1000 MCG tablet Take 1 tablet (1,000 mcg total) by mouth  daily.  . fenofibrate 160 MG tablet Take 1 tablet (160 mg total) by mouth daily.  Marland Kitchen glipiZIDE (GLUCOTROL) 5 MG tablet Take 0.5 tablets (2.5 mg total) by mouth daily before breakfast.  . hydrochlorothiazide (HYDRODIURIL) 25 MG tablet Take 1 tablet (25 mg total) by mouth daily.  . memantine (NAMENDA TITRATION PAK) tablet pack 5 mg/day for =1 week; 5 mg twice daily for =1 week; 15 mg/day given in 5 mg and 10 mg separated doses for =1 week; then 10 mg twice daily  . memantine (NAMENDA) 10 MG tablet TAKE 1 TABLET BY MOUTH TWICE A DAY  . metFORMIN (GLUCOPHAGE) 500 MG tablet Take 1 tablet (500 mg total) by mouth 2 (two) times daily with a meal.  . Omega-3 Fatty Acids (FISH OIL) 1000 MG CAPS Take 1,000 mg by mouth daily with breakfast.  . polyvinyl alcohol (LIQUIFILM TEARS) 1.4 % ophthalmic solution Place 1 drop into both eyes 4 (four) times daily as needed for dry eyes.  . Semaglutide (OZEMPIC) 0.25 or 0.5 MG/DOSE SOPN Inject 0.25 mg into the skin once a week.  . senna-docusate (SENOKOT-S) 8.6-50 MG tablet Take 2 tablets by mouth at bedtime.  . sertraline (ZOLOFT) 25 MG tablet TAKE 2 TABLETS BY MOUTH EVERY DAY (Patient taking differently: 100 mg. Takes 1 tab a day)   No facility-administered encounter  medications on file as of 06/03/2020.    Thank you for the opportunity to participate in the care of Mr. Diane Mochizuki The palliative care team will continue to follow. Please call our office at [phone] if we can be of additional assistance.  Jari Favre, DNP, AGPCNP-BC

## 2020-06-26 ENCOUNTER — Other Ambulatory Visit: Payer: Self-pay | Admitting: Adult Health

## 2020-06-30 ENCOUNTER — Ambulatory Visit: Payer: Medicare HMO | Admitting: Adult Health

## 2020-06-30 ENCOUNTER — Encounter: Payer: Self-pay | Admitting: Adult Health

## 2020-06-30 VITALS — BP 165/83 | HR 67 | Ht 71.0 in

## 2020-06-30 DIAGNOSIS — E785 Hyperlipidemia, unspecified: Secondary | ICD-10-CM

## 2020-06-30 DIAGNOSIS — G4733 Obstructive sleep apnea (adult) (pediatric): Secondary | ICD-10-CM | POA: Diagnosis not present

## 2020-06-30 DIAGNOSIS — F0151 Vascular dementia with behavioral disturbance: Secondary | ICD-10-CM

## 2020-06-30 DIAGNOSIS — Z8673 Personal history of transient ischemic attack (TIA), and cerebral infarction without residual deficits: Secondary | ICD-10-CM

## 2020-06-30 DIAGNOSIS — F01518 Vascular dementia, unspecified severity, with other behavioral disturbance: Secondary | ICD-10-CM

## 2020-06-30 DIAGNOSIS — E119 Type 2 diabetes mellitus without complications: Secondary | ICD-10-CM

## 2020-06-30 MED ORDER — DIVALPROEX SODIUM ER 250 MG PO TB24: 250.0000 mg | ORAL_TABLET | Freq: Every day | ORAL | 5 refills | Status: DC

## 2020-06-30 NOTE — Patient Instructions (Addendum)
Your Plan:  Start on depakote 250mg  nightly - continue for 1 week and let me know if dosage needs to be increased   Continue sertaline 100mg  daily for depression/anxiety  Recommend looking at "Caregiver guide to stroke recovery"   Please let me know if he would like to proceed with any type of therapies or swallowing studies    Follow-up in 6 months or call earlier if needed       Thank you for coming to see Justin [name] at [facility]. I hope we have been able to provide you high quality care today.  You may receive a patient satisfaction survey over the next few weeks. We would appreciate your feedback and comments so that we may continue to improve ourselves and the health of our patients.     Valproic Acid, Divalproex Sodium delayed or extended-release tablets  What is this medicine? DIVALPROEX SODIUM (dye VAL pro ex SO dee um) is used to prevent seizures caused by some forms of epilepsy. It is also used to treat bipolar mania and to prevent migraine headaches. This medicine may be used for other purposes; ask your health care provider or pharmacist if you have questions. COMMON BRAND NAME(S): Depakote, Depakote ER What should I tell my health care provider before I take this medicine? They need to know if you have any of these conditions:  if you often drink alcohol  kidney disease  liver disease  low platelet counts  mitochondrial disease  suicidal thoughts, plans, or attempt; a previous suicide attempt by you or a family member  urea cycle disorder (UCD)  an unusual or allergic reaction to divalproex sodium, sodium valproate, valproic acid, other medicines, foods, dyes, or preservatives  pregnant or trying to get pregnant  breast-feeding How should I use this medicine? Take this medicine by mouth with a drink of water. Follow the directions on the prescription label. Do not cut, crush or chew this medicine. You can take it with or without food.  If it upsets your stomach, take it with food. Take your medicine at regular intervals. Do not take it more often than directed. Do not stop taking except on your doctor's advice. A special MedGuide will be given to you by the pharmacist with each prescription and refill. Be sure to read this information carefully each time. Talk to your pediatrician regarding the use of this medicine in children. While this drug may be prescribed for children as young as 10 years for selected conditions, precautions do apply. Overdosage: If you think you have taken too much of this medicine contact a poison control center or emergency room at once. NOTE: This medicine is only for you. Do not share this medicine with others. What if I miss a dose? If you miss a dose, take it as soon as you can. If it is almost time for your next dose, take only that dose. Do not take double or extra doses. What may interact with this medicine? Do not take this medicine with any of the following medications:  sodium phenylbutyrate This medicine may also interact with the following medications:  aspirin  certain antibiotics like ertapenem, imipenem, meropenem  certain medicines for depression, anxiety, or psychotic disturbances  certain medicines for seizures like carbamazepine, clonazepam, diazepam, ethosuximide, felbamate, lamotrigine, phenobarbital, phenytoin, primidone, rufinamide, topiramate  certain medicines that treat or prevent blood clots like warfarin  cholestyramine  male hormones, like estrogens and birth control pills, patches, or rings  propofol  rifampin  ritonavir  tolbutamide  zidovudine This list may not describe all possible interactions. Give your health care provider a list of all the medicines, herbs, non-prescription drugs, or dietary supplements you use. Also tell them if you smoke, drink alcohol, or use illegal drugs. Some items may interact with your medicine. What should I watch for  while using this medicine? Tell your doctor or health care provider if your symptoms do not get better or they start to get worse. This medicine may cause serious skin reactions. They can happen weeks to months after starting the medicine. Contact your health care provider right away if you notice fevers or flu-like symptoms with a rash. The rash may be red or purple and then turn into blisters or peeling of the skin. Or, you might notice a red rash with swelling of the face, lips or lymph nodes in your neck or under your arms. Wear a medical ID bracelet or chain, and carry a card that describes your disease and details of your medicine and dosage times. You may get drowsy, dizzy, or have blurred vision. Do not drive, use machinery, or do anything that needs mental alertness until you know how this medicine affects you. To reduce dizzy or fainting spells, do not sit or stand up quickly, especially if you are an older patient. Alcohol can increase drowsiness and dizziness. Avoid alcoholic drinks. This medicine can make you more sensitive to the sun. Keep out of the sun. If you cannot avoid being in the sun, wear protective clothing and use sunscreen. Do not use sun lamps or tanning beds/booths. Patients and their families should watch out for new or worsening depression or thoughts of suicide. Also watch out for sudden changes in feelings such as feeling anxious, agitated, panicky, irritable, hostile, aggressive, impulsive, severely restless, overly excited and hyperactive, or not being able to sleep. If this happens, especially at the beginning of treatment or after a change in dose, call your health care provider. Women should inform their doctor if they wish to become pregnant or think they might be pregnant. There is a potential for serious side effects to an unborn child. Talk to your health care provider or pharmacist for more information. Women who become pregnant while using this medicine may enroll in  the [place] Pregnancy Registry by calling [phone]. This registry collects information about the safety of antiepileptic drug use during pregnancy. This medicine may cause a decrease in folic acid and vitamin D. You should make sure that you get enough vitamins while you are taking this medicine. Discuss the foods you eat and the vitamins you take with your health care provider. What side effects may I notice from receiving this medicine? Side effects that you should report to your doctor or health care professional as soon as possible:  allergic reactions like skin rash, itching or hives, swelling of the face, lips, or tongue  changes in vision  rash, fever, and swollen lymph nodes  redness, blistering, peeling or loosening of the skin, including inside the mouth  signs and symptoms of liver injury like dark yellow or brown urine; general ill feeling or flu-like symptoms; light-colored stools; loss of appetite; nausea; right upper belly pain; unusually weak or tired; yellowing of the eyes or skin  suicidal thoughts or other mood changes  unusual bleeding or bruising Side effects that usually do not require medical attention (report to your doctor or health care professional if they continue or are bothersome):  constipation  diarrhea  dizziness  hair loss  headache  loss of appetite  weight gain This list may not describe all possible side effects. Call your doctor for medical advice about side effects. You may report side effects to FDA at 1-800-FDA-1088. Where should I keep my medicine? Keep out of reach of children. Store at room temperature between 15 and 30 degrees C (59 and 86 degrees F). Keep container tightly closed. Throw away any unused medicine after the expiration date. NOTE: This sheet is a summary. It may not cover all possible information. If you have questions about this medicine, talk to your doctor, pharmacist, or health care  provider.  2021 Elsevier/Gold Standard (2018-05-19 16:03:42)

## 2020-06-30 NOTE — Progress Notes (Signed)
[facility] [address] [place]. [place] [address] [phone]       OFFICE FOLLOW UP NOTE  [name] Date of Birth:  Dec 30, 1940 Medical Record Number:  [id number]   [name] [name] Reason for Referral: stroke follow up     CHIEF COMPLAINT:  Chief Complaint  Patient presents with  . Follow-up    RM 70 with wife (judy) PT has had another stroke, easily agitated, speech impairment, frequent fall and memory declined significantly. Pt is not using CPAP all per wife     HPI: Stroke admission 10/03/2018: [name] is a 80 y.o. male with history of hypertension, hyperlipidemia, diabetes, prostate cancer, TBI, OSA on CPAP and stroke 1 year ago  presented to [facility] ED on 10/03/2018 for confusion, slurred speech.  Dr. Erlinda Hong and stroke team consulted with stroke work-up showing left CR infarct as evidenced on MRI likely due to small vessel disease source with resultant slight dysarthria.  No tPA given due to outside window.  MRI also showed evidence of chronic left basal ganglia encephalomalacia.  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  2D echo unremarkable.  LDL 48 and A1c 6.9.  Recommended DAPT for 3 months and Plavix alone given left M2 high-grade stenosis.  Previously on Plavix but admitted noncompliance for several days prior.  HTN stable.  Controlled DM with satisfactory A1c.  HLD stable.  Other stroke risk factors include advanced age and OSA on CPAP (new diagnosis in 02/2018).  Discharged home in stable condition with recommendation of outpatient therapies.  Initial visit 11/07/2018: Justin [name] is being seen today for stroke follow-up.  He was last seen in this office for stroke follow-up on 04/06/2018 for follow-up regarding prior stroke with residual cognitive deficits.  Since recent stroke, residual deficits of ongoing cognitive deficits but wife denies  worsening. No residual speech difficulties.  He has also been having ongoing difficulties with ambulation and dragging of right foot which has been present since prior stroke.  Home Speech therapy will be completed at the end of this week. He will also be completing home PT/OT soon. Per wife, noncompliance of medications for 2 months prior to recent stroke.  He does endorse compliance with current medication regimen.  He continues on aspirin 325mg  daily and Plavix without bleeding or bruising. Continues on atorvastatin without myalgias. .  Blood pressure today 139/73.  He subjectively endorses compliance with CPAP for OSA management but wife states he has not been great with ongoing compliance.  He is initially having difficulties with worsening mood and irritability but PCP increased citalopram dose from 10 mg to 20 mg with improvement.  No further concerns at this time.  Update 02/07/2019: Justin [name] is a 80 year old male who is being seen today for stroke and CPAP follow-up. Stroke - residual deficits include cognitive impairment which has been stable without worsening.  MMSE today 21/30.  Wife does endorse increased emotional outbursts or irritability/agitation over the past couple months.  He is currently on Celexa 10 mg daily initially working well for her overall mood and irritability but wife is questioning possible change in therapy.  Continues on Plavix and aspirin despite 3 months recommendation without bleeding or bruising.  Continues on atorvastatin without myalgias.  Blood pressure today 158/78 but does monitor at home and typically lower.  Denies new or worsening stroke/TIA symptoms.  OSA on CPAP -compliance  report from 01/07/2019 -02/05/2019 shows 27 out of 30 usage days for 90% compliance and 76.7% of days with usage greater than 4 hours with average usage 5 hours and 28 minutes.  Residual AHI 2.3 on a set pressure of 11 cm H2O with EPR level 2.  He has been tolerating CPAP well without  difficulty.  Continues to receive supplies and follow-up with aero care as needed.  He does endorse improvement of overall fatigue and energy level.     Update 09/17/2019 Dr. Leonie Man: Justin [name] is a 80 year old Caucasian male seen today for initial office follow-up visit from hospital admissions for stroke in May 2021.  Is accompanied by his wife.  His history is obtained from them, review of electronic medical records and I personally reviewed imaging films in PACS.  He has past medical history of hypertension, diabetes, hyperlipidemia, depression, intracranial hemorrhage and prior strokes who was admitted recently twice to [facility] on 06/24/2019 initially with 2 small acute infarcts in the right MCA in the centrum semiovale as well as posterior corona radiata.  Patient refused to go to inpatient rehab and returned home as he did well and had no deficits.  He returned back to the hospital on 07/16/2019 with new right leg weakness.  MRI at this time showed a left corona radiator centrum semiovale infarct with expected evolution of previous recent infarcts.  In the prior admissions he had work-up for cryptogenic stroke which included a TEE as well as a loop recorder which negative for paroxysmal A. fib.  LDL cholesterol was 48 mg percent.  Hemoglobin A1c was 7.2.  Echocardiogram showed normal ejection fraction.  He was discharged on dual antiplatelet therapy of aspirin and Plavix as MRI of the brain and neck had revealed multifocal intracranial atherosclerotic changes.  Patient is currently at home getting home physical and occupational therapy but the wife feels he is depressed and walks very little only with the therapist.  He can use a walker but drags his right leg.  His stamina is poor and tires easily after walking only 7-8 steps.  He also gets angry and does not like his wife telling him what to do.  His blood pressures well controlled today it is 132/79.  His fasting sugars have all been good.  Is  tolerating aspirin and Plavix well without bruising or bleeding.  He has been using his CPAP at night.  He has been started on Zoloft 25 mg by his primary care physician for the last 3 to 4 weeks.  Is tolerating well without side effects.  He has not tried increasing the dose.  Update: Today, 01/01/2020, Justin [name] returns for stroke follow-up accompanied by his wife.  Continues to have gait impairment, RLE weakness and moderate to severe cognitive impairment.  Requested discharge from OT today due to lack of progress.  Continues to work with SLP and PT.  Denies new or worsening stroke/TIA symptoms.  PCP recently increased sertraline to 100 mg daily for continued outbursts and depression/anxiety.  Wife reports he is sedentary during the day with minimal ambulation.  She is his primary caregiver.  Remains on Plavix and atorvastatin for secondary stroke prevention without side effects.  Blood pressure today 147/80.  Reports noncompliance with his CPAP since June due to difficulty tolerating mask.  Most recent compliance report that we are able to obtain was from 07/28/2019 -08/26/2019 showing 21 out of 30 usage days for 70% compliance with average usage 8 hours and 18 minutes.  Residual AHI 2.0 on set pressure 11 cm H2O and C-Flex setting 2.  No further concerns at this time.   Update 06/30/2020 JM: Returns for 46-month stroke follow-up accompanied by his wife who provides majority of history.  Reports possible recurrent stroke last week after he woke up with increased slurred speech and right-sided facial weakness currently still present. Wife also notes decline in his cognition since that time.  Chronic right-sided weakness relatively stable except possible mild worsening ankle dorsiflexion weakness.  Occasional difficulty swallowing thin liquids. She did not seek medical evaluation as she knew he would not be a candidate for emergent intervention and current treatment plan would likely not change.  Also notes  continued cognitive decline with frequent outbursts and easily agitated especially towards the evening.  Sleeps well throughout the night but is not compliant with CPAP. Has since started having a sitter in home over the past 3 weeks for 2 days/week from 9am-430pm.  Previously trialed Namenda but intolerant due to GI side effects.  Continues on sertraline 100 mg daily per PCP for continued behavioral concerns and possible underlying depression/anxiety.  Currently being followed by palliative care to help address advanced care planning and goals  Compliant on Plavix and atorvastatin without associated side effects Blood pressure today elevated but per wife, increased agitation while waiting in lobby for todays visit -monitors at home and typically stable  No further concerns at this time    ROS:   N/A d/t cognition  PMH:  Past Medical History:  Diagnosis Date  . Brain bleed ([place])   . Cancer [facility])    prostate  . 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 Depression   . Diabetes mellitus without complication ([place])   . High cholesterol   . Hypertension   . Stroke [facility])     PSH:  Past Surgical History:  Procedure Laterality Date  . arm & hand surgery Left 1972   skill saw accident  . COLON SURGERY     partial  . EYE SURGERY     lens implant and cateract- Left  . PROSTATECTOMY      Social History:  Social History   Socioeconomic History  . Marital status: Married    Spouse name: Not on file  . Number of children: 1  . Years of education: Not on file  . Highest education level: High school graduate  Occupational History  . Not on file  Tobacco Use  . Smoking status: Never Smoker  . Smokeless tobacco: Never Used  Vaping Use  . Vaping Use: Never used  Substance and Sexual Activity  . Alcohol use: Never  . Drug use: Never  . Sexual activity: Not Currently  Other Topics Concern  . Not on file  Social History Narrative   Lives at home with his wife   Right  handed   Caffeine: soft drinks 2-3 cups daily   Social Determinants of Health   Financial Resource Strain: Not on file  Food Insecurity: Not on file  Transportation Needs: Not on file  Physical Activity: Not on file  Stress: Not on file  Social Connections: Not on file  Intimate Partner Violence: Not on file    Family History:  Family History  Problem Relation Age of Onset  . CAD Mother   . Diabetes Mother   . Emphysema Father   . Diabetes Sister   . Diabetes Brother   . Diabetes Sister   . Diabetes Brother   .  Diabetes Mellitus II Neg Hx     Medications:   Current Outpatient Medications on File Prior to Visit  Medication Sig Dispense Refill  . acetaminophen (TYLENOL) 325 MG tablet Take 2 tablets (650 mg total) by mouth every 6 (six) hours as needed for mild pain (or Fever >/= 101).    Marland Kitchen amLODipine (NORVASC) 5 MG tablet Take 1 tablet (5 mg total) by mouth daily. 30 tablet 0  . atorvastatin (LIPITOR) 80 MG tablet Take 1 tablet (80 mg total) by mouth daily. 90 tablet 0  . Cholecalciferol (VITAMIN D-1000 MAX ST) 25 MCG (1000 UT) tablet Take 1 tablet (1,000 Units total) by mouth daily. 30 tablet 0  . clopidogrel (PLAVIX) 75 MG tablet Take 1 tablet (75 mg total) by mouth daily. 90 tablet 4  . cyanocobalamin ([employer] VITAMIN B12) 1000 MCG tablet Take 1 tablet (1,000 mcg total) by mouth daily. 30 tablet 0  . fenofibrate 160 MG tablet Take 1 tablet (160 mg total) by mouth daily. 30 tablet 33  . glipiZIDE (GLUCOTROL) 5 MG tablet Take 0.5 tablets (2.5 mg total) by mouth daily before breakfast. 30 tablet 0  . hydrochlorothiazide (HYDRODIURIL) 25 MG tablet Take 1 tablet (25 mg total) by mouth daily. (Patient taking differently: Take 25 mg by mouth once as needed.) 30 tablet 0  . memantine (NAMENDA TITRATION PAK) tablet pack 5 mg/day for =1 week; 5 mg twice daily for =1 week; 15 mg/day given in 5 mg and 10 mg separated doses for =1 week; then 10 mg twice daily 49 tablet 0  . metFORMIN  (GLUCOPHAGE) 500 MG tablet Take 1 tablet (500 mg total) by mouth 2 (two) times daily with a meal. 60 tablet 0  . Omega-3 Fatty Acids (FISH OIL) 1000 MG CAPS Take 1,000 mg by mouth daily with breakfast.    . polyvinyl alcohol (LIQUIFILM TEARS) 1.4 % ophthalmic solution Place 1 drop into both eyes 4 (four) times daily as needed for dry eyes.    . Semaglutide,0.25 or 0.5MG /DOS, 2 MG/1.5ML SOPN Inject 0.25 mg into the skin once a week.    . senna-docusate (SENOKOT-S) 8.6-50 MG tablet Take 2 tablets by mouth at bedtime.    . sertraline (ZOLOFT) 25 MG tablet TAKE 2 TABLETS BY MOUTH EVERY DAY (Patient taking differently: 100 mg. Takes 1 tab a day) 180 tablet 1  . memantine (NAMENDA) 10 MG tablet TAKE 1 TABLET BY MOUTH TWICE A DAY (Patient not taking: Reported on 06/30/2020) 180 tablet 1   No current facility-administered medications on file prior to visit.    Allergies:  No Known Allergies   Physical Exam  Vitals:   06/30/20 1544  BP: (!) 165/83  Pulse: 67  Height: 5\' 11"  (1.803 m)   Body mass index is 30.68 kg/m. No exam data present  General: well developed, well nourished,  pleasant elderly Caucasian male, seated, in no evident distress Head: head normocephalic and atraumatic.   Neck: supple with no carotid or supraclavicular bruits Cardiovascular: regular rate and rhythm, no murmurs Musculoskeletal: no deformity Skin:  no rash/petichiae Vascular:  Normal pulses all extremities   Neurologic Exam Mental Status: Awake and fully alert.   Moderate dysarthria with some difficulty understanding.  Able to follow simple step commands without difficulty.  Disoriented to place and time.  Recent and remote memory diminished. Attention span, concentration and fund of knowledge diminished with wife providing majority of history. Mood and affect appropriate and cooperative with exam.  Cranial Nerves: Pupils equal, briskly reactive  to light. Extraocular movements full without nystagmus. Visual fields  full to confrontation.  HOH bilaterally. Facial sensation intact. Right lower facial weakness. tongue, palate moves normally and symmetrically.  Motor: Normal bulk and tone. Normal strength in all tested extremity muscles except mild left grip weakness and right hip flexor weakness and right ankle dorsiflexion weakness. Sensory.: intact to touch , pinprick , position and vibratory sensation.  Coordination: Rapid alternating movements normal in all extremities except slightly decreased left hand. Finger-to-nose performed accurately bilaterally and heel-to-shin difficulty performing Gait and Station: Deferred as rolling walker not present during the Reflexes: 1+ and symmetric. Toes downgoing.       ASSESSMENT: BOND GOLDFINE is a pleasant 80 year old Caucasian male with recurrent left MCA small infarcts in setting of recent right MCA ACA infarcts in May 2021 likely from small vessel disease with known prior history of large vessel disease as well as cryptogenic strokes in August 2020 and July 2019.  Multiple vascular risk factors of diabetes, hypertension, hyperlipidemia, peripheral arterial disease, obstructive sleep apnea with CPAP noncompliance, and intracranial atherosclerosis.  Residual right hemiparesis and cognitive impairment.    PLAN:  1. Recent onset symptoms: a. Last week, awakened with dysarthria, right lower facial weakness, possible dysphagia and worsening cognition.  Discussed possibility of recurrent stroke and further evaluation but wife declines as our current treatment plan would likely not change.  Discussed possibly doing HH therapies but as he did not previously participate with therapy, she does not believe this would be beneficial. Discussed MBS for dysphagia concern - declines as he would likely be noncompliant if dysphagia diet implemented - wife verbalizes understanding of risk of possible aspiration pneumonia and will seek out further evaluation if worsens. b. Encouraged  continued follow-up with palliative care to further discuss goals and further medical interventions with discussion of quality versus quantity especially as he remains high risk of additional further strokes 2. Multiple recurrent strokes:  a. Continue clopidogrel 75 mg daily  and atorvastatin for secondary stroke prevention.   b. Recent LDL 39 (06/04/2020 - obtained by [facility]) c. Recent A1c 6.9 (06/04/2020 - obtained by [facility]). d. Discussed secondary stroke prevention measures and importance of close PCP follow-up for aggressive stroke risk factor management with HTN with BP goal<130/90, HLD with LDL goal<70 and DM with A1c goal<7 3. Vascular cognitive impairment with behaviors:  a. MMSE unable to be obtained -subjectively worsening b. Recommend initiating Depakote 250mg  nightly for continued behaviors c. Continue sertraline 100 mg nightly for underlying depression/anxiety per PCP d. Intolerant to [employer] d/t GI side effects -possibly consider Aricept in the future e. Information provided regarding [facility] day programs, home care and memory care resources.  4. OSA with CPAP noncompliance:  a. Continued noncompliance with difficulty tolerating.  In setting of his cognitive impairment, mask refitting or other possible treatment options not recommended at this time    Follow-up in 6 months or call earlier if needed   CC:  [facility] provider: Dr. Lloyd Huger, Rebeca Alert, MD    I spent 43 minutes of face-to-face and non-face-to-face time with patient and wife.  This included previsit chart review as well as [facility], prolonged extensive conversation regarding history of prior strokes and possible new stroke, educated on secondary stroke prevention measures and importance of continued aggressive stroke risk factor management, continued decline of cognitive impairment with behaviors and further interventions, known OSA with CPAP noncompliance and answered all other questions to patient  and wife satisfaction  Frann Rider, AGNP-BC  [facility] [address] [place], [place] [address]  Phone [phone] Fax [phone] Note: This document was prepared with digital dictation and possible smart phrase technology. Any transcriptional errors that result from this process are unintentional.    I reviewed the above note and documentation by the Nurse Practitioner and agree with the history, exam, assessment and plan as outlined above. I was available for consultation. Star Age, MD, PhD [facility])

## 2020-07-01 ENCOUNTER — Encounter: Payer: Self-pay | Admitting: Adult Health

## 2020-07-03 NOTE — Progress Notes (Signed)
I agree with the above plan 

## 2020-07-04 ENCOUNTER — Inpatient Hospital Stay (HOSPITAL_COMMUNITY): Payer: No Typology Code available for payment source

## 2020-07-04 ENCOUNTER — Inpatient Hospital Stay (HOSPITAL_COMMUNITY)
Admission: EM | Admit: 2020-07-04 | Discharge: 2020-07-09 | DRG: 065 | Disposition: A | Discharge status: Skilled Nursing Facility | Payer: No Typology Code available for payment source | Attending: Internal Medicine | Admitting: Internal Medicine

## 2020-07-04 ENCOUNTER — Emergency Department (HOSPITAL_COMMUNITY): Payer: No Typology Code available for payment source

## 2020-07-04 ENCOUNTER — Other Ambulatory Visit: Payer: Self-pay

## 2020-07-04 DIAGNOSIS — Z66 Do not resuscitate: Secondary | ICD-10-CM | POA: Diagnosis present

## 2020-07-04 DIAGNOSIS — F32A Depression, unspecified: Secondary | ICD-10-CM | POA: Diagnosis present

## 2020-07-04 DIAGNOSIS — I6381 Other cerebral infarction due to occlusion or stenosis of small artery: Principal | ICD-10-CM | POA: Diagnosis present

## 2020-07-04 DIAGNOSIS — I5031 Acute diastolic (congestive) heart failure: Secondary | ICD-10-CM | POA: Diagnosis not present

## 2020-07-04 DIAGNOSIS — N390 Urinary tract infection, site not specified: Secondary | ICD-10-CM | POA: Diagnosis present

## 2020-07-04 DIAGNOSIS — I5032 Chronic diastolic (congestive) heart failure: Secondary | ICD-10-CM | POA: Diagnosis present

## 2020-07-04 DIAGNOSIS — Z1389 Encounter for screening for other disorder: Secondary | ICD-10-CM

## 2020-07-04 DIAGNOSIS — R471 Dysarthria and anarthria: Secondary | ICD-10-CM | POA: Diagnosis present

## 2020-07-04 DIAGNOSIS — I110 Hypertensive heart disease with heart failure: Secondary | ICD-10-CM | POA: Diagnosis present

## 2020-07-04 DIAGNOSIS — I69392 Facial weakness following cerebral infarction: Secondary | ICD-10-CM | POA: Diagnosis not present

## 2020-07-04 DIAGNOSIS — R4781 Slurred speech: Secondary | ICD-10-CM | POA: Diagnosis present

## 2020-07-04 DIAGNOSIS — Z9079 Acquired absence of other genital organ(s): Secondary | ICD-10-CM

## 2020-07-04 DIAGNOSIS — Z825 Family history of asthma and other chronic lower respiratory diseases: Secondary | ICD-10-CM

## 2020-07-04 DIAGNOSIS — E1165 Type 2 diabetes mellitus with hyperglycemia: Secondary | ICD-10-CM

## 2020-07-04 DIAGNOSIS — Z20822 Contact with and (suspected) exposure to covid-19: Secondary | ICD-10-CM | POA: Diagnosis present

## 2020-07-04 DIAGNOSIS — Z7984 Long term (current) use of oral hypoglycemic drugs: Secondary | ICD-10-CM | POA: Diagnosis not present

## 2020-07-04 DIAGNOSIS — R29898 Other symptoms and signs involving the musculoskeletal system: Secondary | ICD-10-CM | POA: Diagnosis not present

## 2020-07-04 DIAGNOSIS — Z79899 Other long term (current) drug therapy: Secondary | ICD-10-CM | POA: Diagnosis not present

## 2020-07-04 DIAGNOSIS — G4733 Obstructive sleep apnea (adult) (pediatric): Secondary | ICD-10-CM | POA: Diagnosis present

## 2020-07-04 DIAGNOSIS — R29706 NIHSS score 6: Secondary | ICD-10-CM | POA: Diagnosis present

## 2020-07-04 DIAGNOSIS — I10 Essential (primary) hypertension: Secondary | ICD-10-CM | POA: Diagnosis not present

## 2020-07-04 DIAGNOSIS — E876 Hypokalemia: Secondary | ICD-10-CM | POA: Diagnosis not present

## 2020-07-04 DIAGNOSIS — I639 Cerebral infarction, unspecified: Principal | ICD-10-CM | POA: Diagnosis present

## 2020-07-04 DIAGNOSIS — Z961 Presence of intraocular lens: Secondary | ICD-10-CM | POA: Diagnosis present

## 2020-07-04 DIAGNOSIS — E7800 Pure hypercholesterolemia, unspecified: Secondary | ICD-10-CM | POA: Diagnosis present

## 2020-07-04 DIAGNOSIS — Z833 Family history of diabetes mellitus: Secondary | ICD-10-CM | POA: Diagnosis not present

## 2020-07-04 DIAGNOSIS — Z9842 Cataract extraction status, left eye: Secondary | ICD-10-CM | POA: Diagnosis not present

## 2020-07-04 DIAGNOSIS — R8271 Bacteriuria: Secondary | ICD-10-CM | POA: Diagnosis not present

## 2020-07-04 DIAGNOSIS — Z8546 Personal history of malignant neoplasm of prostate: Secondary | ICD-10-CM

## 2020-07-04 DIAGNOSIS — Z7902 Long term (current) use of antithrombotics/antiplatelets: Secondary | ICD-10-CM | POA: Diagnosis not present

## 2020-07-04 DIAGNOSIS — F331 Major depressive disorder, recurrent, moderate: Secondary | ICD-10-CM | POA: Diagnosis present

## 2020-07-04 DIAGNOSIS — I63312 Cerebral infarction due to thrombosis of left middle cerebral artery: Secondary | ICD-10-CM | POA: Diagnosis not present

## 2020-07-04 DIAGNOSIS — Z8249 Family history of ischemic heart disease and other diseases of the circulatory system: Secondary | ICD-10-CM

## 2020-07-04 DIAGNOSIS — Z86010 Personal history of colonic polyps: Secondary | ICD-10-CM

## 2020-07-04 DIAGNOSIS — E785 Hyperlipidemia, unspecified: Secondary | ICD-10-CM | POA: Diagnosis present

## 2020-07-04 DIAGNOSIS — E119 Type 2 diabetes mellitus without complications: Secondary | ICD-10-CM

## 2020-07-04 LAB — GLUCOSE, CAPILLARY: Glucose-Capillary: 119 mg/dL — ABNORMAL HIGH (ref 70–99)

## 2020-07-04 LAB — URINALYSIS, ROUTINE W REFLEX MICROSCOPIC
Bilirubin Urine: NEGATIVE
Glucose, UA: NEGATIVE mg/dL
Hgb urine dipstick: NEGATIVE
Ketones, ur: NEGATIVE mg/dL
Nitrite: NEGATIVE
Protein, ur: NEGATIVE mg/dL
Specific Gravity, Urine: 1.016 (ref 1.005–1.030)
pH: 6 (ref 5.0–8.0)

## 2020-07-04 LAB — I-STAT CHEM 8, ED
BUN: 19 mg/dL (ref 8–23)
Calcium, Ion: 1.2 mmol/L (ref 1.15–1.40)
Chloride: 104 mmol/L (ref 98–111)
Creatinine, Ser: 0.9 mg/dL (ref 0.61–1.24)
Glucose, Bld: 83 mg/dL (ref 70–99)
HCT: 42 % (ref 39.0–52.0)
Hemoglobin: 14.3 g/dL (ref 13.0–17.0)
Potassium: 3.9 mmol/L (ref 3.5–5.1)
Sodium: 140 mmol/L (ref 135–145)
TCO2: 27 mmol/L (ref 22–32)

## 2020-07-04 LAB — COMPREHENSIVE METABOLIC PANEL
ALT: 17 U/L (ref 0–44)
AST: 21 U/L (ref 15–41)
Albumin: 3.6 g/dL (ref 3.5–5.0)
Alkaline Phosphatase: 23 U/L — ABNORMAL LOW (ref 38–126)
Anion gap: 7 (ref 5–15)
BUN: 16 mg/dL (ref 8–23)
CO2: 28 mmol/L (ref 22–32)
Calcium: 9.3 mg/dL (ref 8.9–10.3)
Chloride: 105 mmol/L (ref 98–111)
Creatinine, Ser: 0.92 mg/dL (ref 0.61–1.24)
GFR, Estimated: >60 mL/min (ref >60)
Glucose, Bld: 87 mg/dL (ref 70–99)
Potassium: 4 mmol/L (ref 3.5–5.1)
Sodium: 140 mmol/L (ref 135–145)
Total Bilirubin: 0.5 mg/dL (ref 0.3–1.2)
Total Protein: 6.3 g/dL — ABNORMAL LOW (ref 6.5–8.1)

## 2020-07-04 LAB — CBC
HCT: 44.1 % (ref 39.0–52.0)
Hemoglobin: 14.4 g/dL (ref 13.0–17.0)
MCH: 27.3 pg (ref 26.0–34.0)
MCHC: 32.7 g/dL (ref 30.0–36.0)
MCV: 83.5 fL (ref 80.0–100.0)
Platelets: 266 10*3/uL (ref 150–400)
RBC: 5.28 MIL/uL (ref 4.22–5.81)
RDW: 13.7 % (ref 11.5–15.5)
WBC: 8.2 10*3/uL (ref 4.0–10.5)
nRBC: 0 % (ref 0.0–0.2)

## 2020-07-04 LAB — RESP PANEL BY RT-PCR (FLU A&B, COVID) ARPGX2
Influenza A by PCR: NEGATIVE
Influenza B by PCR: NEGATIVE
SARS Coronavirus 2 by RT PCR: NEGATIVE

## 2020-07-04 LAB — DIFFERENTIAL
Abs Immature Granulocytes: 0.03 10*3/uL (ref 0.00–0.07)
Basophils Absolute: 0.1 10*3/uL (ref 0.0–0.1)
Basophils Relative: 1 %
Eosinophils Absolute: 0.3 10*3/uL (ref 0.0–0.5)
Eosinophils Relative: 3 %
Immature Granulocytes: 0 %
Lymphocytes Relative: 25 %
Lymphs Abs: 2.1 10*3/uL (ref 0.7–4.0)
Monocytes Absolute: 0.6 10*3/uL (ref 0.1–1.0)
Monocytes Relative: 7 %
Neutro Abs: 5.3 10*3/uL (ref 1.7–7.7)
Neutrophils Relative %: 64 %

## 2020-07-04 LAB — VALPROIC ACID LEVEL: Valproic Acid Lvl: <10 ug/mL — ABNORMAL LOW (ref 50.0–100.0)

## 2020-07-04 LAB — APTT: aPTT: 25 seconds (ref 24–36)

## 2020-07-04 LAB — RAPID URINE DRUG SCREEN, HOSP PERFORMED
Amphetamines: NOT DETECTED
Barbiturates: NOT DETECTED
Benzodiazepines: NOT DETECTED
Cocaine: NOT DETECTED
Opiates: NOT DETECTED
Tetrahydrocannabinol: NOT DETECTED

## 2020-07-04 LAB — PROTIME-INR
INR: 1 (ref 0.8–1.2)
Prothrombin Time: 13.3 seconds (ref 11.4–15.2)

## 2020-07-04 LAB — ETHANOL: Alcohol, Ethyl (B): <10 mg/dL (ref <10)

## 2020-07-04 MED ORDER — ACETAMINOPHEN 325 MG PO TABS: 650.0000 mg | ORAL_TABLET | ORAL | Status: DC | PRN

## 2020-07-04 MED ORDER — INSULIN ASPART 100 UNIT/ML IJ SOLN
0.0000 [IU] | Freq: Three times a day (TID) | INTRAMUSCULAR | Status: DC
  Administered 2020-07-05 – 2020-07-08 (×2): 2 [IU] via SUBCUTANEOUS

## 2020-07-04 MED ORDER — CLOPIDOGREL BISULFATE 75 MG PO TABS
75.0000 mg | ORAL_TABLET | Freq: Every day | ORAL | Status: DC
  Administered 2020-07-05 – 2020-07-09 (×5): 75 mg via ORAL
  Filled 2020-07-04 (×5): qty 1

## 2020-07-04 MED ORDER — ACETAMINOPHEN 160 MG/5ML PO SOLN: 650.0000 mg | ORAL | Status: DC | PRN

## 2020-07-04 MED ORDER — SODIUM CHLORIDE 0.9 % IV SOLN: 1.0000 g | INTRAVENOUS | Status: DC

## 2020-07-04 MED ORDER — LORAZEPAM 2 MG/ML IJ SOLN: 1.0000 mg | Freq: Once | INTRAMUSCULAR | Status: DC | PRN

## 2020-07-04 MED ORDER — SENNOSIDES-DOCUSATE SODIUM 8.6-50 MG PO TABS
1.0000 | ORAL_TABLET | Freq: Every evening | ORAL | Status: DC | PRN
  Administered 2020-07-07: 1 via ORAL
  Filled 2020-07-04: qty 1

## 2020-07-04 MED ORDER — ACETAMINOPHEN 650 MG RE SUPP: 650.0000 mg | RECTAL | Status: DC | PRN

## 2020-07-04 MED ORDER — STROKE: EARLY STAGES OF RECOVERY BOOK
Freq: Once | Status: AC
  Filled 2020-07-04: qty 1

## 2020-07-04 MED ORDER — SODIUM CHLORIDE 0.9 % IV SOLN
1.0000 g | Freq: Once | INTRAVENOUS | Status: AC
  Administered 2020-07-04: 1 g via INTRAVENOUS
  Filled 2020-07-04: qty 10

## 2020-07-04 NOTE — ED Notes (Signed)
Transport arrived to take pt to the floor at this time

## 2020-07-04 NOTE — ED Provider Notes (Signed)
[name] Provider Note   CSN: [id number] Arrival date & time: 07/04/20  [id number] 80 year old male with history of stroke, intercranial hemorrhage, diabetes, hypertension, and hypercholesterolemia presents emergency department for right-sided weakness.  Patient is unable to contribute to history or review of systems due to speech difficulty from previous stroke.  Wife, [name], available by phone.  Contributes to history.  States that 1 week ago, his speech got less clear and he had a worsening facial droop.  This has been constant and unchanged.  She states last night when he went to bed (9pm), he was able to move his right lower extremity well enough to walk.  This morning, when he woke up his right lower extremity was so weak he was unable to walk.  She states it was very hard to help him bathe and get to the hospital.  Similar to previous strokes.  Nothing is made the symptoms better, nothing has made the symptoms worse.  Has not received anything for the symptoms.  States that he lives at home with her, they do have an aide, but she is unable to care for him if he is unable to walk.    History Chief Complaint  Patient presents with  . 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 male.  HPI     Past Medical History:  Diagnosis Date  . Brain bleed ([place])   . Cancer [facility])    prostate  . 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 Depression   . Diabetes mellitus without complication ([place])   . High cholesterol   . Hypertension   . Stroke [facility])     Patient Active Problem List   Diagnosis Date  Noted  . UTI (urinary tract infection) 07/04/2020  . Right leg weakness 07/04/2020  . Bacteria in urine 07/04/2020  . Labile blood glucose   . 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 OSA (obstructive sleep apnea)   . Controlled type 2 diabetes mellitus with hyperglycemia, without long-term current use of insulin ([place])   . CVA (cerebral vascular accident) ([place]) 07/16/2019  . Dyslipidemia   . Benign essential HTN   . Diabetes mellitus type 2 in obese ([place])   . Major depressive disorder, recurrent episode, moderate ([place])   . Gait disorder   . Acute right MCA stroke ([place]) 06/28/2019  . Ambulatory dysfunction   . Stroke ([place]) 06/24/2019  . Type 2 diabetes mellitus ([place]) 10/04/2018  . Depression 10/04/2018  . Acute CVA (cerebrovascular accident) ([place]) 09/18/2017  . Stroke (cerebrum) ([place]) 09/17/2017  . Diabetes mellitus without complication ([place]) 57/84/6962  . Closed head injury 04/22/2013  . HLD (hyperlipidemia) 07/29/2009  . Essential hypertension 07/29/2009  . EFFUSION, PLEURAL 07/29/2009  . DIVERTICULITIS, COLON 07/29/2009  . 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 COLONIC POLYPS, HYPERPLASTIC, HX OF 07/29/2009    Past Surgical History:  Procedure Laterality Date  . arm & hand surgery Left 1972   skill saw accident  . COLON SURGERY     partial  .  EYE SURGERY     lens implant and cateract- Left  . PROSTATECTOMY         Family History  Problem Relation Age of Onset  . CAD Mother   . Diabetes Mother   . Emphysema Father   . Diabetes Sister   . Diabetes Brother   . Diabetes Sister   . Diabetes Brother   . Diabetes Mellitus II Neg Hx     Social History   Tobacco Use  . Smoking status: Never Smoker  . Smokeless tobacco: Never Used  Vaping Use  . Vaping Use: Never used  Substance Use Topics  . Alcohol use: Never  . Drug use: Never    Home Medications Prior to Admission medications   Medication Sig Start Date End Date Taking? Authorizing Provider   acetaminophen (TYLENOL) 500 MG tablet Take 500 mg by mouth every 6 (six) hours as needed for moderate pain.   Yes [provider]  amLODipine (NORVASC) 5 MG tablet Take 1 tablet (5 mg total) by mouth daily. 08/02/19  Yes Angiulli, Lavon Paganini, PA-C  atorvastatin (LIPITOR) 80 MG tablet Take 1 tablet (80 mg total) by mouth daily. 08/02/19  Yes Angiulli, Lavon Paganini, PA-C  Cholecalciferol (VITAMIN D-1000 MAX ST) 25 MCG (1000 UT) tablet Take 1 tablet (1,000 Units total) by mouth daily. 08/02/19  Yes Angiulli, Lavon Paganini, PA-C  clopidogrel (PLAVIX) 75 MG tablet Take 1 tablet (75 mg total) by mouth daily. 08/02/19  Yes Angiulli, Lavon Paganini, PA-C  divalproex (DEPAKOTE ER) 250 MG 24 hr tablet Take 1 tablet (250 mg total) by mouth daily. Patient taking differently: Take 250 mg by mouth every evening. 06/30/20  Yes Frann Rider, NP  fenofibrate 160 MG tablet Take 1 tablet (160 mg total) by mouth daily. 08/02/19 05/09/22 Yes Angiulli, Lavon Paganini, PA-C  glipiZIDE (GLUCOTROL) 5 MG tablet Take 0.5 tablets (2.5 mg total) by mouth daily before breakfast. 08/02/19  Yes Angiulli, Lavon Paganini, PA-C  hydrochlorothiazide (HYDRODIURIL) 25 MG tablet Take 1 tablet (25 mg total) by mouth daily. Patient taking differently: Take 25 mg by mouth once as needed (hbp). 08/02/19  Yes Angiulli, Lavon Paganini, PA-C  metFORMIN (GLUCOPHAGE) 500 MG tablet Take 1 tablet (500 mg total) by mouth 2 (two) times daily with a meal. 08/02/19  Yes Angiulli, Lavon Paganini, PA-C  Omega-3 Fatty Acids (FISH OIL) 1000 MG CAPS Take 1,000 mg by mouth daily with breakfast.   Yes [provider]  polyvinyl alcohol (LIQUIFILM TEARS) 1.4 % ophthalmic solution Place 1 drop into both eyes 4 (four) times daily as needed for dry eyes.   Yes [provider]  Semaglutide,0.25 or 0.5MG /DOS, 2 MG/1.5ML SOPN Inject 0.25 mg into the skin once a week.   Yes [provider]  senna-docusate (SENOKOT-S) 8.6-50 MG tablet Take 2 tablets by mouth at bedtime. Patient  taking differently: Take 1 tablet by mouth at bedtime. 07/11/19  Yes Love, Ivan Anchors, PA-C  sertraline (ZOLOFT) 25 MG tablet TAKE 2 TABLETS BY MOUTH EVERY DAY Patient taking differently: Take 25 mg by mouth at bedtime. 11/08/19  Yes Garvin Fila, MD  acetaminophen (TYLENOL) 325 MG tablet Take 2 tablets (650 mg total) by mouth every 6 (six) hours as needed for mild pain (or Fever >/= 101). Patient not taking: Reported on 07/04/2020 08/02/19   Angiulli, Lavon Paganini, PA-C  cyanocobalamin ([employer] VITAMIN B12) 1000 MCG tablet Take 1 tablet (1,000 mcg total) by mouth daily. Patient not taking: Reported on 07/04/2020 08/02/19  Angiulli, Lavon Paganini, PA-C    Allergies    Patient has no known allergies.  Review of Systems   Review of Systems  Constitutional: Negative for chills and fever.  HENT: Negative for ear pain and sore throat.   Eyes: Negative for pain and visual disturbance.  Respiratory: Negative for cough and shortness of breath.   Cardiovascular: Negative for chest pain and palpitations.  Gastrointestinal: Negative for abdominal pain and vomiting.  Genitourinary: Negative for dysuria and hematuria.  Musculoskeletal: Negative for arthralgias and back pain.  Skin: Negative for color change and rash.  Neurological: Positive for speech difficulty and weakness. Negative for seizures and syncope.  Psychiatric/Behavioral: Negative for self-injury.  ROS per wife.  Physical Exam Updated Vital Signs BP (!) 155/73 (BP Location: Left Arm)   Pulse 76   Temp 98.7 F (37.1 C) (Oral)   Resp 16   Ht 5\' 11"  (1.803 m)   Wt 90.7 kg   SpO2 98%   BMI 27.89 kg/m   Physical Exam Vitals and nursing note reviewed.  Constitutional:      Appearance: He is well-developed.  HENT:     Head: Atraumatic.     Right Ear: External ear normal.     Left Ear: External ear normal.     Nose: Nose normal.  Eyes:     Extraocular Movements: Extraocular movements intact.     Conjunctiva/sclera: Conjunctivae normal.      Pupils: Pupils are equal, round, and reactive to light.  Cardiovascular:     Rate and Rhythm: Normal rate and regular rhythm.     Heart sounds: No murmur heard.   Pulmonary:     Effort: Pulmonary effort is normal. No respiratory distress.     Breath sounds: Normal breath sounds.  Abdominal:     Palpations: Abdomen is soft.     Tenderness: There is no abdominal tenderness.  Musculoskeletal:        General: Normal range of motion.     Cervical back: Neck supple.  Skin:    General: Skin is warm and dry.  Neurological:     Mental Status: He is alert.     Comments: Difficult to assess for facial droop as he has trouble following commands.  Face appears symmetric at rest.  Has right lower extremity drift.  Other extremities with mild generalized weakness, but able to give some opposition.  Psychiatric:        Behavior: Behavior normal.     ED Results / Procedures / Treatments   Labs (all labs ordered are listed, but only abnormal results are displayed) Labs Reviewed  COMPREHENSIVE METABOLIC PANEL - Abnormal; Notable for the following components:      Result Value   Total Protein 6.3 (*)    Alkaline Phosphatase 23 (*)    All other components within normal limits  URINALYSIS, ROUTINE W REFLEX MICROSCOPIC - Abnormal; Notable for the following components:   Leukocytes,Ua LARGE (*)    Bacteria, UA RARE (*)    All other components within normal limits  GLUCOSE, CAPILLARY - Abnormal; Notable for the following components:   Glucose-Capillary 119 (*)    All other components within normal limits  RESP PANEL BY RT-PCR (FLU A&B, COVID) ARPGX2  URINE CULTURE  ETHANOL  PROTIME-INR  APTT  CBC  DIFFERENTIAL  RAPID URINE DRUG SCREEN, HOSP PERFORMED  VALPROIC ACID LEVEL  LIPID PANEL  CBC  COMPREHENSIVE METABOLIC PANEL  I-STAT CHEM 8, ED    EKG EKG Interpretation  Date/Time:  Friday Jul 04 2020 15:38:11 EDT Ventricular Rate:  70 PR Interval:  172 QRS Duration: 150 QT  Interval:  418 QTC Calculation: 451 R Axis:   -47 Text Interpretation: Sinus rhythm Right bundle branch block Inferior infarct, old No significant change since last tracing Confirmed by Wandra Arthurs [phone]) on 07/04/2020 3:56:38 PM   Radiology CT HEAD WO CONTRAST  Result Date: 07/04/2020 CLINICAL DATA:  Mental status changes, generalized weakness, confusion, remote infarcts EXAM: CT HEAD WITHOUT CONTRAST TECHNIQUE: Contiguous axial images were obtained from the base of the skull through the vertex without intravenous contrast. COMPARISON:  07/16/2019 FINDINGS: Brain: Stable atrophy pattern and diffuse bilateral white matter microvascular ischemic changes throughout both cerebral hemispheres. Mild associated ventricular enlargement. Remote bilateral basal ganglia infarcts. No new acute intracranial hemorrhage, definite new infarction, midline shift, herniation, or hydrocephalus. No extra-axial fluid collection. Cisterns are patent. Cerebellar unremarkable and stable in appearance. Vascular: No hyperdense vessel or unexpected calcification. Skull: Normal. Negative for fracture or focal lesion. Sinuses/Orbits: No acute finding. Other: None. IMPRESSION: Stable brain atrophy pattern and chronic white matter microvascular ischemic changes. Remote basal ganglia lacunar type infarcts. No acute intracranial abnormality or interval change by noncontrast CT. Electronically Signed   By: Jerilynn Mages.  Shick M.D.   On: 07/04/2020 16:43   DG Chest Portable 1 View  Result Date: 07/04/2020 CLINICAL DATA:  Foreign body MRI clearance. EXAM: PORTABLE CHEST 1 VIEW COMPARISON:  06/24/2019 FINDINGS: Overlying monitoring devices/EKG leads in place. No implanted medical device to preclude MRI imaging. Stable heart size and mediastinal contours. Mild elevation of right hemidiaphragm. No acute airspace disease. No pleural fluid or pneumothorax. No acute osseous abnormalities are seen. IMPRESSION: 1. No implanted medical device to preclude  MRI imaging. 2. No acute process in the chest. Electronically Signed   By: Keith Rake M.D.   On: 07/04/2020 23:16   DG Abd Portable 1V  Result Date: 07/04/2020 CLINICAL DATA:  Metal screening prior to MRI. EXAM: PORTABLE ABDOMEN - 1 VIEW COMPARISON:  None. FINDINGS: Overlying monitoring devices. There is no implanted medical device to preclude MRI imaging. Right upper quadrant calcification, favor gallstone. Increased air throughout nondilated small bowel. Mild gaseous gastric distension. Bone island in the left iliac bone. No acute osseous abnormalities are seen. IMPRESSION: 1. No implanted medical device to preclude MRI imaging. 2. Right upper quadrant calcification, favor gallstone. 3. Increased air throughout nondilated small bowel may represent an ileus. Electronically Signed   By: Keith Rake M.D.   On: 07/04/2020 23:15    Procedures Procedures   Medications Ordered in ED Medications  LORazepam (ATIVAN) injection 1 mg (has no administration in time range)   stroke: mapping our early stages of recovery book (has no administration in time range)  acetaminophen (TYLENOL) tablet 650 mg (has no administration in time range)    Or  acetaminophen (TYLENOL) 160 MG/5ML solution 650 mg (has no administration in time range)    Or  acetaminophen (TYLENOL) suppository 650 mg (has no administration in time range)  senna-docusate (Senokot-S) tablet 1 tablet (has no administration in time range)  clopidogrel (PLAVIX) tablet 75 mg (has no administration in time range)  insulin aspart (novoLOG) injection 0-15 Units (has no administration in time range)  cefTRIAXone (ROCEPHIN) 1 g in sodium chloride 0.9 % 100 mL IVPB (has no administration in time range)  cefTRIAXone (ROCEPHIN) 1 g in sodium chloride 0.9 % 100 mL IVPB (1 g Intravenous New Bag/Given 07/04/20 2041)    ED Course  I  have reviewed the triage vital signs and the nursing notes.  Pertinent labs & imaging results that were available  during my care of the patient were reviewed by me and considered in my medical decision making (see chart for details).    MDM Rules/Calculators/A&P                          80 year old male with history of stroke, hypertension, hyperlipidemia, and diabetes presents the emergency department for right lower extremity weakness.  Vital signs are stable, patient is not in acute distress.  Exam does show right lower extremity drift.  Does have very slurred speech, difficult to understand.  Does not have significant pain with range of motion, does not not think weakness is mediated by pain.  Wife denies history of recent fall.  Obvious concerns for stroke.  Stroke order set placed.  Could also be intracranial hemorrhage.  Upon discussing with wife, I do not think that they would want any drastic measures at this time, though patient's new deficits seems to significantly have compromised his ability to for forearm ADLs and for his wife to be able to care for him.  We will also order labs to evaluate for possible metabolic causes of weakness.  CT head with no acute stroke.  Labs did show concern for UTI.  Patient given IV Rocephin.  Otherwise labs unrevealing.  Consulted neurology.  They reviewed medical record.  We have suspicion for occult stroke versus reactivation of prior stroke or due to UTI.  Recommend MRI without.  This was ordered.  Patient admitted for further evaluation of stroke and UTI.  Patient will also need PT/OT due to decreased functional status.  Wife amenable to plan.  Final Clinical Impression(s) / ED Diagnoses Final diagnoses:  Encounter for imaging to screen for metal prior to MRI    Rx / DC Orders ED Discharge Orders    None       Suzan Nailer, DO 07/04/20 2335    Drenda Freeze, MD 07/08/20 1659

## 2020-07-04 NOTE — ED Notes (Signed)
Did not sign waiver of MSE, due to AMS

## 2020-07-04 NOTE — H&P (Addendum)
History and Physical    Justin [name] IRW:[id number] DOB: 1940-12-24 DOA: 07/04/2020  PCP: Chesley Noon, MD Vicenta Aly, NP at Dr. Vanetta Mulders office Patient coming from: home  I have personally briefly reviewed patient's old medical records in [place]  Chief Complaint: right let weakness  HPI: Justin [name] is a 80 y.o. male with medical history significant of prior CVA baseline left upper extremity lower extremity weakness right lower extremity weakness, hypertension, hyperlipidemia presents with worsening dysarthria from last week and acutely right lower leg weakness since this morning.  Patient brought in by EMS.  Patient apparently saw neurology last week and was started on Depakote for agitation and verbal outburst per wife.  Neurology aware of new dysarthria last week, no changes, continue Plavix and atorvastatin.  Patient developed acute right lower leg increased weakness this morning, prior baseline pt able to lift leg but now was dragging his foot all day per wife who I spoke with over the phone.  Patient has some dysarthria, alert and oriented x2, able to provide some history but usually provides one-word answers.  History provided by chart review and discussing with ED provider as well as wife over the phone.  Patient received Rocephin in ED for concern of bacteriuria.  Patient denies any dysuria urgency or frequency but wife endorses strong odor to urine in patient's diaper in the mornings.  Negative flu and COVID.  UA has large leuks no nitrites rare bacteria.  UDS negative.  CT head scan showing stable brain atrophy, chronic white microvascular ischemic changes, remote basal ganglia lacunar type infarct.  Patient was sitting up in bed, states he just finished dinner.   ED Course: given rocephin in ED.  Review of Systems: As per HPI otherwise all other systems reviewed and are negative.   Past Medical History:  Diagnosis Date  . Brain bleed ([place])   . Cancer [facility])     prostate  . 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 Depression   . Diabetes mellitus without complication ([place])   . High cholesterol   . Hypertension   . Stroke [facility])     Past Surgical History:  Procedure Laterality Date  . arm & hand surgery Left 1972   skill saw accident  . COLON SURGERY     partial  . EYE SURGERY     lens implant and cateract- Left  . PROSTATECTOMY      Social History  reports that he has never smoked. He has never used smokeless tobacco. He reports that he does not drink alcohol and does not use drugs.  No Known Allergies  Family History  Problem Relation Age of Onset  . CAD Mother   . Diabetes Mother   . Emphysema Father   . Diabetes Sister   . Diabetes Brother   . Diabetes Sister   . Diabetes Brother   . Diabetes Mellitus II Neg Hx      Prior to Admission medications   Medication Sig Start Date End Date Taking? Authorizing Provider  acetaminophen (TYLENOL) 325 MG tablet Take 2 tablets (650 mg total) by mouth every 6 (six) hours as needed for mild pain (or Fever >/= 101). 08/02/19   Angiulli, Lavon Paganini, PA-C  amLODipine (NORVASC) 5 MG tablet Take 1 tablet (5 mg total) by mouth daily. 08/02/19   Angiulli, Lavon Paganini, PA-C  atorvastatin (LIPITOR) 80 MG tablet Take 1 tablet (80 mg total) by mouth daily. 08/02/19  Angiulli, Lavon Paganini, PA-C  Cholecalciferol (VITAMIN D-1000 MAX ST) 25 MCG (1000 UT) tablet Take 1 tablet (1,000 Units total) by mouth daily. 08/02/19   Angiulli, Lavon Paganini, PA-C  clopidogrel (PLAVIX) 75 MG tablet Take 1 tablet (75 mg total) by mouth daily. 08/02/19   Angiulli, Lavon Paganini, PA-C  cyanocobalamin ([employer] VITAMIN B12) 1000 MCG tablet Take 1 tablet (1,000 mcg total) by mouth daily. 08/02/19   Angiulli, Lavon Paganini, PA-C  divalproex (DEPAKOTE ER) 250 MG 24 hr tablet Take 1 tablet (250 mg total) by mouth daily. 06/30/20   Frann Rider, NP  fenofibrate 160 MG tablet Take 1 tablet (160 mg total) by mouth daily. 08/02/19 05/09/22  Angiulli,  Lavon Paganini, PA-C  glipiZIDE (GLUCOTROL) 5 MG tablet Take 0.5 tablets (2.5 mg total) by mouth daily before breakfast. 08/02/19   Angiulli, Lavon Paganini, PA-C  hydrochlorothiazide (HYDRODIURIL) 25 MG tablet Take 1 tablet (25 mg total) by mouth daily. Patient taking differently: Take 25 mg by mouth once as needed. 08/02/19   Angiulli, Lavon Paganini, PA-C  metFORMIN (GLUCOPHAGE) 500 MG tablet Take 1 tablet (500 mg total) by mouth 2 (two) times daily with a meal. 08/02/19   Angiulli, Lavon Paganini, PA-C  Omega-3 Fatty Acids (FISH OIL) 1000 MG CAPS Take 1,000 mg by mouth daily with breakfast.    [provider]  polyvinyl alcohol (LIQUIFILM TEARS) 1.4 % ophthalmic solution Place 1 drop into both eyes 4 (four) times daily as needed for dry eyes.    [provider]  Semaglutide,0.25 or 0.5MG /DOS, 2 MG/1.5ML SOPN Inject 0.25 mg into the skin once a week.    [provider]  senna-docusate (SENOKOT-S) 8.6-50 MG tablet Take 2 tablets by mouth at bedtime. 07/11/19   Love, Ivan Anchors, PA-C  sertraline (ZOLOFT) 25 MG tablet TAKE 2 TABLETS BY MOUTH EVERY DAY Patient taking differently: 100 mg. Takes 1 tab a day 11/08/19   Garvin Fila, MD    Physical Exam: Vitals:   07/04/20 1537 07/04/20 1615 07/04/20 1730 07/04/20 1815  BP: (!) 159/77 140/82 (!) 171/85 (!) 155/86  Pulse: 73 71 75 74  Resp: 16 17 16 18   Temp:      TempSrc:      SpO2: 97% 98% 97% 96%  Weight:      Height:        Constitutional: NAD, calm, comfortable Vitals:   07/04/20 1537 07/04/20 1615 07/04/20 1730 07/04/20 1815  BP: (!) 159/77 140/82 (!) 171/85 (!) 155/86  Pulse: 73 71 75 74  Resp: 16 17 16 18   Temp:      TempSrc:      SpO2: 97% 98% 97% 96%  Weight:      Height:       Eyes: PERRL, lids and conjunctivae normal ENMT: Mucous membranes are moist. Posterior pharynx clear of any exudate or lesions.Normal dentition.  Neck: normal, supple, no masses, no thyromegaly Respiratory: clear to auscultation bilaterally, no  wheezing, no crackles. Normal respiratory effort. No accessory muscle use.  Cardiovascular: Regular rate and rhythm, no murmurs / rubs / gallops.  1+ bilateral lower edema. 2+ pedal pulses. No carotid bruits.  Abdomen: no tenderness, no masses palpated. No hepatosplenomegaly. Bowel sounds positive.  Musculoskeletal: 4-5 left upper and lower extremity, 4/5 right upper extremity, 3 out of 5 right lower extremity skin: no rashes, lesions, ulcers. No induration Neurologic: CN 2-12 grossly intact.  Decreased sensation to light touch right lower extremity, dysarthria noted psychiatric: Patient flat. Alert and oriented x  2. Normal mood.   Labs on Admission: I have personally reviewed following labs and imaging studies  CBC: Recent Labs  Lab 07/04/20 1729 07/04/20 1745  WBC 8.2  --   NEUTROABS 5.3  --   HGB 14.4 14.3  HCT 44.1 42.0  MCV 83.5  --   PLT 266  --     Basic Metabolic Panel: Recent Labs  Lab 07/04/20 1729 07/04/20 1745  NA 140 140  K 4.0 3.9  CL 105 104  CO2 28  --   GLUCOSE 87 83  BUN 16 19  CREATININE 0.92 0.90  CALCIUM 9.3  --     GFR: Estimated Creatinine Clearance: 79.2 mL/min (by C-G formula based on SCr of 0.9 mg/dL).  Liver Function Tests: Recent Labs  Lab 07/04/20 1729  AST 21  ALT 17  ALKPHOS 23*  BILITOT 0.5  PROT 6.3*  ALBUMIN 3.6    Urine analysis:    Component Value Date/Time   COLORURINE YELLOW 07/04/2020 [place] 07/04/2020 1558   LABSPEC 1.016 07/04/2020 1558   PHURINE 6.0 07/04/2020 1558   GLUCOSEU NEGATIVE 07/04/2020 1558   [place] 07/04/2020 1558   [place] 07/04/2020 1558   KETONESUR NEGATIVE 07/04/2020 1558   PROTEINUR NEGATIVE 07/04/2020 1558   UROBILINOGEN 4.0 (H) 06/18/2009 1928   NITRITE NEGATIVE 07/04/2020 1558   LEUKOCYTESUR LARGE (A) 07/04/2020 1558    Radiological Exams on Admission: CT HEAD WO CONTRAST  Result Date: 07/04/2020 CLINICAL DATA:  Mental status changes,  generalized weakness, confusion, remote infarcts EXAM: CT HEAD WITHOUT CONTRAST TECHNIQUE: Contiguous axial images were obtained from the base of the skull through the vertex without intravenous contrast. COMPARISON:  07/16/2019 FINDINGS: Brain: Stable atrophy pattern and diffuse bilateral white matter microvascular ischemic changes throughout both cerebral hemispheres. Mild associated ventricular enlargement. Remote bilateral basal ganglia infarcts. No new acute intracranial hemorrhage, definite new infarction, midline shift, herniation, or hydrocephalus. No extra-axial fluid collection. Cisterns are patent. Cerebellar unremarkable and stable in appearance. Vascular: No hyperdense vessel or unexpected calcification. Skull: Normal. Negative for fracture or focal lesion. Sinuses/Orbits: No acute finding. Other: None. IMPRESSION: Stable brain atrophy pattern and chronic white matter microvascular ischemic changes. Remote basal ganglia lacunar type infarcts. No acute intracranial abnormality or interval change by noncontrast CT. Electronically Signed   By: Jerilynn Mages.  Shick M.D.   On: 07/04/2020 16:43    EKG: Independently reviewed.  sr w/ rbbb Assessment/Plan Principal Problem:   Right leg weakness Active Problems:   HLD (hyperlipidemia)   Essential hypertension   Type 2 diabetes mellitus (HCC)   Depression   Benign essential HTN   UTI (urinary tract infection)   Bacteria in urine   Right leg weakness-concerning for new stroke with prior baseline weakness, continue to monitor.  Patient out of window for tPA Neurochecks Fall precautions Follow-up neurology consult Follow-up MRI of brain Physical therapy consult Continue Plavix Telemetry Carotid ultrasound F/u echo  Hyperlipidemia- continue statin Follow-up lipid panel  Hypertension-we will allow permissive hypertension Potentially do resume Norvasc in 24 hours, follow-up neurology consult  Diabetes type 2-follow hemoglobin A1c, blood sugar  checks and low-dose sliding scale insulin  Major depression-resume SSRI, Depakote and follow-up Depakote level  Bacteriuria-continue Rocephin, follow-up urine culture  Disposition-admission, telemetry, patient is DNR  DVT prophylaxis: SCDs Code Status:   DNR Family Communication:  Discussed with patient's wife Ms. Justin [name] over the phone at [phone] Disposition Plan:   Patient is from:  Home  Anticipated DC to:  Home  Anticipated DC date:  2 to 3 days  Anticipated DC barriers: PT needs  Consults called:  ED provider discussed patient with Dr. Leonel Ramsay.  I spoke with Dr. Saralyn Pilar in person, after MRI, neurology will evaluate patient. admission status:  Inpatient Severity of Illness: The appropriate patient status for this patient is INPATIENT. Inpatient status is judged to be reasonable and necessary in order to provide the required intensity of service to ensure the patient's safety. The patient's presenting symptoms, physical exam findings, and initial radiographic and laboratory data in the context of their chronic comorbidities is felt to place them at high risk for further clinical deterioration. Furthermore, it is not anticipated that the patient will be medically stable for discharge from the hospital within 2 midnights of admission. The following factors support the patient status of inpatient.  Concern of potential subacute stroke with right worsening right leg weakness, history of CVAs,  " The patient's presenting symptoms include right leg weakness " The worrisome physical exam findings include increased weakness right leg. " The initial radiographic and laboratory data are worrisome because of bacturia. " The chronic co-morbidities include htn, dmII, hx cva   * I certify that at the point of admission it is my clinical judgment that the patient will require inpatient hospital care spanning beyond 2 midnights from the point of admission due to high intensity of service,  high risk for further deterioration and high frequency of surveillance required.Jacqlyn Krauss MD [facility] Pager [phone]  How to contact the [facility] Attending or Consulting provider [place] or covering provider during after hours 7P -7A, for this patient?   1. Check the care team in [facility] and look for a) attending/consulting [facility] provider listed and b) the [facility] team listed 2. Log into [email or web address] and use [place]'s universal password to access. If you do not have the password, please contact the hospital operator. 3. Locate the [facility] provider you are looking for under [facility] and page to a number that you can be directly reached. 4. If you still have difficulty reaching the provider, please page the [facility] (Director on Call) for the Hospitalists listed on amion for assistance.  07/04/2020, 8:29 PM

## 2020-07-04 NOTE — ED Notes (Signed)
Received verbal report from Madolyn Frieze at this time

## 2020-07-04 NOTE — ED Triage Notes (Signed)
See chief complaint in triage notes, pt pleasantly confused, has hx of aphasia, no distress, family wants no stroke interventions, family desires any interventions needed not related to a stroke

## 2020-07-05 ENCOUNTER — Inpatient Hospital Stay (HOSPITAL_COMMUNITY): Payer: No Typology Code available for payment source

## 2020-07-05 ENCOUNTER — Other Ambulatory Visit: Payer: Self-pay

## 2020-07-05 ENCOUNTER — Other Ambulatory Visit (HOSPITAL_COMMUNITY): Payer: No Typology Code available for payment source

## 2020-07-05 DIAGNOSIS — F32A Depression, unspecified: Secondary | ICD-10-CM

## 2020-07-05 DIAGNOSIS — I10 Essential (primary) hypertension: Secondary | ICD-10-CM

## 2020-07-05 DIAGNOSIS — R29898 Other symptoms and signs involving the musculoskeletal system: Secondary | ICD-10-CM

## 2020-07-05 DIAGNOSIS — I63312 Cerebral infarction due to thrombosis of left middle cerebral artery: Secondary | ICD-10-CM

## 2020-07-05 LAB — LIPID PANEL
Cholesterol: 143 mg/dL (ref 0–200)
HDL: 32 mg/dL — ABNORMAL LOW (ref >40)
LDL Cholesterol: 87 mg/dL (ref 0–99)
Total CHOL/HDL Ratio: 4.5 RATIO
Triglycerides: 122 mg/dL (ref <150)
VLDL: 24 mg/dL (ref 0–40)

## 2020-07-05 LAB — GLUCOSE, CAPILLARY
Glucose-Capillary: 98 mg/dL (ref 70–99)
Glucose-Capillary: 115 mg/dL — ABNORMAL HIGH (ref 70–99)
Glucose-Capillary: 127 mg/dL — ABNORMAL HIGH (ref 70–99)
Glucose-Capillary: 123 mg/dL — ABNORMAL HIGH (ref 70–99)

## 2020-07-05 LAB — COMPREHENSIVE METABOLIC PANEL
ALT: 15 U/L (ref 0–44)
AST: 16 U/L (ref 15–41)
Albumin: 3.2 g/dL — ABNORMAL LOW (ref 3.5–5.0)
Alkaline Phosphatase: 21 U/L — ABNORMAL LOW (ref 38–126)
Anion gap: 9 (ref 5–15)
BUN: 16 mg/dL (ref 8–23)
CO2: 25 mmol/L (ref 22–32)
Calcium: 8.7 mg/dL — ABNORMAL LOW (ref 8.9–10.3)
Chloride: 104 mmol/L (ref 98–111)
Creatinine, Ser: 0.83 mg/dL (ref 0.61–1.24)
GFR, Estimated: >60 mL/min (ref >60)
Glucose, Bld: 102 mg/dL — ABNORMAL HIGH (ref 70–99)
Potassium: 3.4 mmol/L — ABNORMAL LOW (ref 3.5–5.1)
Sodium: 138 mmol/L (ref 135–145)
Total Bilirubin: 0.5 mg/dL (ref 0.3–1.2)
Total Protein: 5.5 g/dL — ABNORMAL LOW (ref 6.5–8.1)

## 2020-07-05 LAB — CBC
HCT: 39.1 % (ref 39.0–52.0)
Hemoglobin: 12.8 g/dL — ABNORMAL LOW (ref 13.0–17.0)
MCH: 27.2 pg (ref 26.0–34.0)
MCHC: 32.7 g/dL (ref 30.0–36.0)
MCV: 83.2 fL (ref 80.0–100.0)
Platelets: 259 10*3/uL (ref 150–400)
RBC: 4.7 MIL/uL (ref 4.22–5.81)
RDW: 13.5 % (ref 11.5–15.5)
WBC: 6.8 10*3/uL (ref 4.0–10.5)
nRBC: 0 % (ref 0.0–0.2)

## 2020-07-05 MED ORDER — ATORVASTATIN CALCIUM 80 MG PO TABS
80.0000 mg | ORAL_TABLET | Freq: Every day | ORAL | Status: DC
  Administered 2020-07-05 – 2020-07-09 (×5): 80 mg via ORAL
  Filled 2020-07-05 (×5): qty 1

## 2020-07-05 MED ORDER — SENNOSIDES-DOCUSATE SODIUM 8.6-50 MG PO TABS: 1.0000 | ORAL_TABLET | Freq: Every day | ORAL | Status: DC

## 2020-07-05 MED ORDER — FENOFIBRATE 160 MG PO TABS
160.0000 mg | ORAL_TABLET | Freq: Every day | ORAL | Status: DC
  Administered 2020-07-05 – 2020-07-09 (×5): 160 mg via ORAL
  Filled 2020-07-05 (×5): qty 1

## 2020-07-05 MED ORDER — DIVALPROEX SODIUM ER 250 MG PO TB24
250.0000 mg | ORAL_TABLET | Freq: Every day | ORAL | Status: DC
  Administered 2020-07-05 – 2020-07-09 (×5): 250 mg via ORAL
  Filled 2020-07-05 (×5): qty 1

## 2020-07-05 MED ORDER — AMLODIPINE BESYLATE 5 MG PO TABS: 5.0000 mg | ORAL_TABLET | Freq: Every day | ORAL | Status: DC

## 2020-07-05 MED ORDER — ORAL CARE MOUTH RINSE
15.0000 mL | Freq: Two times a day (BID) | OROMUCOSAL | Status: DC
  Administered 2020-07-06 – 2020-07-09 (×7): 15 mL via OROMUCOSAL

## 2020-07-05 MED ORDER — CHLORHEXIDINE GLUCONATE 0.12 % MT SOLN
15.0000 mL | Freq: Two times a day (BID) | OROMUCOSAL | Status: DC
  Administered 2020-07-05 – 2020-07-09 (×9): 15 mL via OROMUCOSAL
  Filled 2020-07-05 (×8): qty 15

## 2020-07-05 MED ORDER — POLYVINYL ALCOHOL 1.4 % OP SOLN
1.0000 [drp] | Freq: Four times a day (QID) | OPHTHALMIC | Status: DC | PRN
  Administered 2020-07-07: 1 [drp] via OPHTHALMIC
  Filled 2020-07-05: qty 15

## 2020-07-05 MED ORDER — SERTRALINE HCL 25 MG PO TABS
25.0000 mg | ORAL_TABLET | Freq: Every day | ORAL | Status: DC
  Administered 2020-07-05 – 2020-07-09 (×5): 25 mg via ORAL
  Filled 2020-07-05 (×5): qty 1

## 2020-07-05 MED ORDER — ACETAMINOPHEN 500 MG PO TABS: 500.0000 mg | ORAL_TABLET | Freq: Four times a day (QID) | ORAL | Status: DC | PRN

## 2020-07-05 MED ORDER — ASPIRIN EC 81 MG PO TBEC
81.0000 mg | DELAYED_RELEASE_TABLET | Freq: Every day | ORAL | Status: DC
  Administered 2020-07-05: 81 mg via ORAL
  Filled 2020-07-05: qty 1

## 2020-07-05 NOTE — Progress Notes (Signed)
VASCULAR LAB    Carotid duplex has been performed.  See CV proc for preliminary results.   Sequoyah Ramone, RVT 07/05/2020, 1:33 PM

## 2020-07-05 NOTE — Progress Notes (Signed)
PROGRESS NOTE        PATIENT DETAILS Name: Justin [name] Age: 80 y.o. Sex: male Date of Birth: 05-15-1940 Admit Date: 07/04/2020 Admitting Physician Justin Krauss, MD XBM:[id number], Justin Alert, MD  Brief Narrative: Patient is a 80 y.o. male with history of prior CVA, HTN, HLD, DM-2-who presented with worsening right leg weakness-further evaluation revealed acute CVA.  Significant events: 5/13>> admitted with right leg weakness-found to have acute CVA.  Significant studies: 06/25/2019>> A1c 7.2 5/13>> chest x-ray: No pneumonia. 5/13>> CT head: No acute intracranial abnormality. 5/14>> MRI brain: Infarct involving left frontal corona radiata 5/14>> LDL: 87  Antimicrobial therapy: None  Microbiology data: 5/13>> COVID/influenza PCR: Negative  Procedures : None  Consults: Neurology  DVT Prophylaxis : SCD's Start: 07/04/20 2053   Subjective: Some improvement in his right leg weakness overnight.   Assessment/Plan: Acute CVA: Improving right leg weakness-has prior history of multiple recurrent strokes.  Current CVA likely due to small vessel disease.  Continue aspirin/Plavix-await echo-and further recommendations from the stroke team.  Awaiting rehab services evaluation as well.  HLD: Continue statin/fenofibrate  HTN: BP reasonable-allow permissive hypertension.  DM-2-A1c pending-CBGs currently stable on SSI.  Recent Labs    07/04/20 2125 07/05/20 0735  GLUCAP 119* 98   Depression: Stable-continue Zoloft  History of dementia-with occasional delirium: Maintained on Depakote.  Asymptomatic bacteriuria: No urinary symptoms-stop Rocephin.   Diet: Diet Order            Diet NPO time specified  Diet effective now                  Code Status: DNR  Family Communication: Justin [name] with [phone] over phone on 5/14  Disposition Plan: Status is: Inpatient  Remains inpatient appropriate because:Inpatient level of  care appropriate due to severity of illness   Dispo: The patient is from: Home              Anticipated d/c is to: Home              Patient currently is not medically stable to d/c.   Difficult to place patient No   Barriers to Discharge: Acute CVA-awaiting further work-up and evaluation by rehab services.  Antimicrobial agents: Anti-infectives (From admission, onward)   Start     Dose/Rate Route Frequency Ordered Stop   07/05/20 2000  cefTRIAXone (ROCEPHIN) 1 g in sodium chloride 0.9 % 100 mL IVPB        1 g 200 mL/hr over 30 Minutes Intravenous Every 24 hours 07/04/20 2059     07/04/20 1915  cefTRIAXone (ROCEPHIN) 1 g in sodium chloride 0.9 % 100 mL IVPB        1 g 200 mL/hr over 30 Minutes Intravenous  Once 07/04/20 1914 07/05/20 0749       Time spent: 25- minutes-Greater than 50% of this time was spent in counseling, explanation of diagnosis, planning of further management, and coordination of care.  MEDICATIONS: Scheduled Meds: .  stroke: mapping our early stages of recovery book   Does not apply Once  . amLODipine  5 mg Oral Daily  . atorvastatin  80 mg Oral Daily  . clopidogrel  75 mg Oral Daily  . divalproex  250 mg Oral Daily  . fenofibrate  160 mg Oral Daily  . insulin aspart  0-15 Units Subcutaneous TID WC  .  sertraline  25 mg Oral QHS   Continuous Infusions: . cefTRIAXone (ROCEPHIN)  IV     PRN Meds:.acetaminophen **OR** acetaminophen (TYLENOL) oral liquid 160 mg/5 mL **OR** acetaminophen, LORazepam, polyvinyl alcohol, senna-docusate   PHYSICAL EXAM: Vital signs: Vitals:   07/04/20 1815 07/04/20 2045 07/04/20 2125 07/05/20 0447  BP: (!) 155/86 (!) 159/98 (!) 155/73 (!) 152/51  Pulse: 74 73 76 60  Resp: 18 16 16 16   Temp:  98.2 F (36.8 C) 98.7 F (37.1 C) 98.2 F (36.8 C)  TempSrc:  Oral Oral Oral  SpO2: 96% 96% 98% 97%  Weight:    97 kg  Height:       Filed Weights   07/04/20 1535 07/05/20 0447  Weight: 90.7 kg 97 kg   Body mass index is  29.83 kg/m.   Gen Exam:[name] awake-not in any distress HEENT:atraumatic, normocephalic Chest: B/L clear to auscultation anteriorly CVS:S1S2 regular Abdomen:soft non tender, non distended Extremities:no edema Neurology: Mild right lower extremity weakness. Skin: no rash  I have personally reviewed following labs and imaging studies  LABORATORY DATA: CBC: Recent Labs  Lab 07/04/20 1729 07/04/20 1745 07/05/20 0222  WBC 8.2  --  6.8  NEUTROABS 5.3  --   --   HGB 14.4 14.3 12.8*  HCT 44.1 42.0 39.1  MCV 83.5  --  83.2  PLT 266  --  [id number]    Basic Metabolic Panel: Recent Labs  Lab 07/04/20 1729 07/04/20 1745 07/05/20 0222  NA 140 140 138  K 4.0 3.9 3.4*  CL 105 104 104  CO2 28  --  25  GLUCOSE 87 83 102*  BUN 16 19 16   CREATININE 0.92 0.90 0.83  CALCIUM 9.3  --  8.7*    GFR: Estimated Creatinine Clearance: 88.6 mL/min (by C-G formula based on SCr of 0.83 mg/dL).  Liver Function Tests: Recent Labs  Lab 07/04/20 1729 07/05/20 0222  AST 21 16  ALT 17 15  ALKPHOS 23* 21*  BILITOT 0.5 0.5  PROT 6.3* 5.5*  ALBUMIN 3.6 3.2*   No results for input(s): LIPASE, AMYLASE in the last 168 hours. No results for input(s): AMMONIA in the last 168 hours.  Coagulation Profile: Recent Labs  Lab 07/04/20 1729  INR 1.0    Cardiac Enzymes: No results for input(s): CKTOTAL, CKMB, CKMBINDEX, TROPONINI in the last 168 hours.  BNP (last 3 results) No results for input(s): PROBNP in the last 8760 hours.  Lipid Profile: Recent Labs    07/05/20 0222  CHOL 143  HDL 32*  LDLCALC 87  TRIG 122  CHOLHDL 4.5    Thyroid Function Tests: No results for input(s): TSH, T4TOTAL, FREET4, T3FREE, THYROIDAB in the last 72 hours.  Anemia Panel: No results for input(s): VITAMINB12, FOLATE, FERRITIN, TIBC, IRON, RETICCTPCT in the last 72 hours.  Urine analysis:    Component Value Date/Time   COLORURINE YELLOW 07/04/2020 1558   APPEARANCEUR CLEAR 07/04/2020 1558   LABSPEC  1.016 07/04/2020 1558   PHURINE 6.0 07/04/2020 1558   GLUCOSEU NEGATIVE 07/04/2020 1558   HGBUR NEGATIVE 07/04/2020 [place] 07/04/2020 1558   [place] 07/04/2020 1558   PROTEINUR NEGATIVE 07/04/2020 1558   UROBILINOGEN 4.0 (H) 06/18/2009 1928   NITRITE NEGATIVE 07/04/2020 1558   LEUKOCYTESUR LARGE (A) 07/04/2020 1558    Sepsis Labs: Lactic Acid, Venous    Component Value Date/Time   LATICACIDVEN 1.7 06/18/2009 1838    MICROBIOLOGY: Recent Results (from the past 240 hour(s))  Resp Panel  by RT-PCR (Flu A&B, Covid) Nasopharyngeal Swab     Status: None   Collection Time: 07/04/20  6:15 PM   Specimen: Nasopharyngeal Swab; Nasopharyngeal(NP) swabs in vial transport medium  Result Value Ref Range Status   SARS Coronavirus 2 by RT PCR NEGATIVE NEGATIVE Final    Comment: (NOTE) SARS-CoV-2 target nucleic acids are NOT DETECTED.  The SARS-CoV-2 RNA is generally detectable in upper respiratory specimens during the acute phase of infection. The lowest concentration of SARS-CoV-2 viral copies this assay can detect is 138 copies/mL. A negative result does not preclude SARS-Cov-2 infection and should not be used as the sole basis for treatment or other patient management decisions. A negative result may occur with  improper specimen collection/handling, submission of specimen other than nasopharyngeal swab, presence of viral mutation(s) within the areas targeted by this assay, and inadequate number of viral copies(<138 copies/mL). A negative result must be combined with clinical observations, patient history, and epidemiological information. The expected result is Negative.  Fact Sheet for Patients:  EntrepreneurPulse.com.au  Fact Sheet for Healthcare Providers:  IncredibleEmployment.be  This test is no t yet approved or cleared by the [place] FDA and  has been authorized for detection and/or diagnosis of  SARS-CoV-2 by FDA under an Emergency Use Authorization (EUA). This EUA will remain  in effect (meaning this test can be used) for the duration of the COVID-19 declaration under Section 564(b)(1) of the Act, 21 U.S.C.section 360bbb-3(b)(1), unless the authorization is terminated  or revoked sooner.       Influenza A by PCR NEGATIVE NEGATIVE Final   Influenza B by PCR NEGATIVE NEGATIVE Final    Comment: (NOTE) The Xpert Xpress SARS-CoV-2/FLU/RSV plus assay is intended as an aid in the diagnosis of influenza from Nasopharyngeal swab specimens and should not be used as a sole basis for treatment. Nasal washings and aspirates are unacceptable for Xpert Xpress SARS-CoV-2/FLU/RSV testing.  Fact Sheet for Patients: EntrepreneurPulse.com.au  Fact Sheet for Healthcare Providers: IncredibleEmployment.be  This test is not yet approved or cleared by the [place] FDA and has been authorized for detection and/or diagnosis of SARS-CoV-2 by FDA under an Emergency Use Authorization (EUA). This EUA will remain in effect (meaning this test can be used) for the duration of the COVID-19 declaration under Section 564(b)(1) of the Act, 21 U.S.C. section 360bbb-3(b)(1), unless the authorization is terminated or revoked.  Performed at [facility], [place] [address], [place], [place] [address]     RADIOLOGY STUDIES/RESULTS: CT HEAD WO CONTRAST  Result Date: 07/04/2020 CLINICAL DATA:  Mental status changes, generalized weakness, confusion, remote infarcts EXAM: CT HEAD WITHOUT CONTRAST TECHNIQUE: Contiguous axial images were obtained from the base of the skull through the vertex without intravenous contrast. COMPARISON:  07/16/2019 FINDINGS: Brain: Stable atrophy pattern and diffuse bilateral white matter microvascular ischemic changes throughout both cerebral hemispheres. Mild associated ventricular enlargement. Remote bilateral basal ganglia infarcts. No new  acute intracranial hemorrhage, definite new infarction, midline shift, herniation, or hydrocephalus. No extra-axial fluid collection. Cisterns are patent. Cerebellar unremarkable and stable in appearance. Vascular: No hyperdense vessel or unexpected calcification. Skull: Normal. Negative for fracture or focal lesion. Sinuses/Orbits: No acute finding. Other: None. IMPRESSION: Stable brain atrophy pattern and chronic white matter microvascular ischemic changes. Remote basal ganglia lacunar type infarcts. No acute intracranial abnormality or interval change by noncontrast CT. Electronically Signed   By: Jerilynn Mages.  Shick M.D.   On: 07/04/2020 16:43   MR BRAIN WO CONTRAST  Result Date: 07/05/2020 CLINICAL  DATA:  Initial evaluation for neuro deficit, stroke suspected. EXAM: MRI HEAD WITHOUT CONTRAST TECHNIQUE: Multiplanar, multiecho pulse sequences of the brain and surrounding structures were obtained without intravenous contrast. COMPARISON:  Prior CT from 07/04/2020 and MRI from 07/16/2019. FINDINGS: Brain: Diffuse prominence of the CSF containing spaces compatible with generalized age-related cerebral atrophy, advanced in nature. Extensive patchy and confluent T2/FLAIR hyperintensity within the periventricular and deep white matter both cerebral hemispheres most consistent with chronic small vessel ischemic disease, also advanced. Multiple remote lacunar infarcts present about the bilateral basal ganglia and corona radiata. Associated chronic hemosiderin staining present at a prominent left basal ganglia lacunar infarct. 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 Small remote lacunar infarcts at the right thalamus and left pons. Appearance is relatively stable from previous. 12 mm focus of diffusion abnormality involving the left frontal corona radiata consistent with an early subacute ischemic infarct (series 5, image 80). Associated T2/FLAIR signal abnormality without  significant regional mass effect. Possible minimal associated petechial hemorrhage without hemorrhagic transformation. No other evidence for acute or subacute ischemia. Gray-white matter differentiation otherwise maintained. No acute intracranial hemorrhage. No mass lesion, midline shift, or mass effect. Diffuse ventricular prominence related to global parenchymal volume loss without hydrocephalus. Mild ex vacuo dilatation of the left lateral ventricle related to chronic left basal ganglia ischemic change. No extra-axial fluid collection. Pituitary gland suprasellar region within normal limits. Midline structures intact. Vascular: Major intracranial vascular flow voids are maintained. Skull and upper cervical spine: Craniocervical junction within normal limits. Bone marrow signal intensity normal. No scalp soft tissue abnormality. Sinuses/Orbits: Patient status post ocular lens replacement on the left. Globes and orbital soft tissues demonstrate no acute finding. Scattered mucosal thickening noted throughout the ethmoidal air cells and maxillary sinuses. Trace left mastoid effusion noted, of doubtful significance. Inner ear structures grossly normal. Other: None. IMPRESSION: 1. 12 mm early subacute ischemic infarct involving the left frontal corona radiata. Possible minimal associated petechial hemorrhage without hemorrhagic transformation or significant mass effect. 2. No other acute intracranial abnormality. 3. Advanced cerebral atrophy with chronic microvascular ischemic disease with multiple remote lacunar infarcts as above. Electronically Signed   By: Jeannine Boga M.D.   On: 07/05/2020 01:25   DG Chest Portable 1 View  Result Date: 07/04/2020 CLINICAL DATA:  Foreign body MRI clearance. EXAM: PORTABLE CHEST 1 VIEW COMPARISON:  06/24/2019 FINDINGS: Overlying monitoring devices/EKG leads in place. No implanted medical device to preclude MRI imaging. Stable heart size and mediastinal contours. Mild  elevation of right hemidiaphragm. No acute airspace disease. No pleural fluid or pneumothorax. No acute osseous abnormalities are seen. IMPRESSION: 1. No implanted medical device to preclude MRI imaging. 2. No acute process in the chest. Electronically Signed   By: Keith Rake M.D.   On: 07/04/2020 23:16   DG Abd Portable 1V  Result Date: 07/04/2020 CLINICAL DATA:  Metal screening prior to MRI. EXAM: PORTABLE ABDOMEN - 1 VIEW COMPARISON:  None. FINDINGS: Overlying monitoring devices. There is no implanted medical device to preclude MRI imaging. Right upper quadrant calcification, favor gallstone. Increased air throughout nondilated small bowel. Mild gaseous gastric distension. Bone island in the left iliac bone. No acute osseous abnormalities are seen. IMPRESSION: 1. No implanted medical device to preclude MRI imaging. 2. Right upper quadrant calcification, favor gallstone. 3. Increased air throughout nondilated small bowel may represent an ileus. Electronically Signed   By: Keith Rake M.D.   On: 07/04/2020 23:15     LOS: 1  day   Oren Binet, MD  [facility]    To contact the attending provider between 7A-7P or the covering provider during after hours 7P-7A, please log into the web site [email or web address] and access using universal [place] password for that web site. If you do not have the password, please call the hospital operator.  07/05/2020, 10:33 AM

## 2020-07-05 NOTE — Evaluation (Signed)
Clinical/Bedside Swallow Evaluation Patient Details  Name: Justin [name] MRN: [id number] Date of Birth: [birth year]  Today's Date: 07/05/2020 Time: SLP Start Time (ACUTE ONLY): 1100 SLP Stop Time (ACUTE ONLY): 1122 SLP Time Calculation (min) (ACUTE ONLY): 22 min  Past Medical History:  Past Medical History:  Diagnosis Date  . Brain bleed ([place])   . Cancer [facility])    prostate  . 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 Depression   . Diabetes mellitus without complication ([place])   . High cholesterol   . Hypertension   . Stroke [facility])    Past Surgical History:  Past Surgical History:  Procedure Laterality Date  . arm & hand surgery Left 1972   skill saw accident  . COLON SURGERY     partial  . EYE SURGERY     lens implant and cateract- Left  . PROSTATECTOMY     HPI:  Patient is a 80 y.o. male with history of prior CVA (Left 2019, Right 06/2019, one week later Left CVA 06/2019), cognitive impairments, HTN, HLD, DM-2-who presented with worsening right leg weakness-further evaluation revealed acute CVA. CXR 5/13 acute process in the chest.  BSE 07/17/19 no s/s, 3 oz without difficulty- reg/thin recommended).   Assessment / Plan / Recommendation Clinical Impression  Pt  was pleasant, distractible and interactive with prompts throughout evaluation. He has had 3 prior strokes without sigificant cranial nerve impact related to oropharyngeal function. Concern that his airway is compromised with thin liquids. He stopped drinking after 5-7 seconds during 3 oz water consumption and noted to have a consistent cough immediately and delayed despite therapist cueing for decrease volume and rate. Mildly prolonged transit with solid texture with cognitive component impacting further. He drank nectar thick consistently remainder of evaluation and appeared coordinated and safe from only subjective stance. SLP recommends nectar thick liquids, Dys 3 due to decreased attention, pills crushed and assist  for feeding if needed. Therapist wil continue and make modifications to treatement plan as needed. SLP Visit Diagnosis: Dysphagia, unspecified (R13.10)    Aspiration Risk  Mild aspiration risk;Moderate aspiration risk    Diet Recommendation Dysphagia 3 (Mech soft);Thin liquid   Liquid Administration via: Cup;Straw Medication Administration: Crushed with puree Supervision: Staff to assist with self feeding Compensations: Slow rate;Small sips/bites;Lingual sweep for clearance of pocketing Postural Changes: Seated upright at 90 degrees    Other  Recommendations Oral Care Recommendations: Oral care BID   Follow up Recommendations Other (comment) (TBD)      Frequency and Duration min 2x/week  2 weeks       Prognosis Prognosis for Safe Diet Advancement:  (fair-good) Barriers to Reach Goals: Cognitive deficits      Swallow Study   General Date of Onset: 07/04/20 HPI: Patient is a 80 y.o. male with history of prior CVA (Left 2019, Right 06/2019, one week later Left CVA 06/2019), cognitive impairments, HTN, HLD, DM-2-who presented with worsening right leg weakness-further evaluation revealed acute CVA. CXR 5/13 acute process in the chest.  BSE 07/17/19 no s/s, 3 oz without difficulty- reg/thin recommended). Type of Study: Bedside Swallow Evaluation Previous Swallow Assessment:  (see HPI) Diet Prior to this Study: NPO Temperature Spikes Noted: No Respiratory Status: Room air History of Recent Intubation: No Behavior/Cognition: Alert;Cooperative;Pleasant mood;Confused;Requires cueing Oral Cavity Assessment: Within Functional Limits Oral Care Completed by SLP: No Oral Cavity - Dentition: Adequate natural dentition Vision: Functional for self-feeding Self-Feeding Abilities: Needs assist Patient Positioning: Upright in bed Baseline Vocal Quality:  Normal Volitional Cough: Strong Volitional Swallow: Able to elicit    Oral/Motor/Sensory Function Overall Oral Motor/Sensory Function:  Within functional limits   Ice Chips Ice chips: Not tested   Thin Liquid Thin Liquid: Impaired Presentation: Cup;Straw Pharyngeal  Phase Impairments: Cough - Immediate    Nectar Thick Nectar Thick Liquid: Within functional limits Presentation: Cup;Straw   Honey Thick Honey Thick Liquid: Not tested   Puree Puree: Within functional limits   Solid     Solid: Impaired Oral Phase Impairments: Reduced lingual movement/coordination Oral Phase Functional Implications: Prolonged oral transit Pharyngeal Phase Impairments:  (none)      Mick Sell, Orbie Pyo 07/05/2020,1:29 PM Orbie Pyo Colvin Caroli.Ed Risk analyst [phone] Office [phone]

## 2020-07-05 NOTE — Evaluation (Signed)
Physical Therapy Evaluation Patient Details Name: Justin [name] MRN: [id number] DOB: 03/23/1940 Today's Date: 07/05/2020   History of Present Illness  80 y.o. male with medical history significant of prior CVA baseline left upper extremity lower extremity weakness right lower extremity weakness, hypertension, hyperlipidemia presents with worsening dysarthria and right lower leg weakness.  MRI suggests:early subacute ischemic infarct involving the left frontal  corona radiata.  Patient with UTI as well.  Clinical Impression  Patient received in bed. He is agreeable to PT session. Patient is slow to respond and answers questions with one word answers. No family present in room. Patient required mod assist for bed mobility and sitting balance. Patient able to stand with mod assist from bed and ambulated 3 feet with RW and mod assist. Patient having difficulty advancing R LE for gait. He will continue to benefit from skilled PT while here to improve mobility, strength and safety.      Follow Up Recommendations SNF;Supervision/Assistance - 24 hour    Equipment Recommendations  None recommended by PT;Other (comment) (TBD)    Recommendations for Other Services       Precautions / Restrictions Precautions Precautions: Fall Restrictions Weight Bearing Restrictions: No Other Position/Activity Restrictions: Dys 2 diet      Mobility  Bed Mobility Overal bed mobility: Needs Assistance Bed Mobility: Supine to Sit     Supine to sit: Mod assist;HOB elevated          Transfers Overall transfer level: Needs assistance Equipment used: Rolling walker (2 wheeled) Transfers: Sit to/from Stand Sit to Stand: Mod assist Stand pivot transfers: Mod assist          Ambulation/Gait Ambulation/Gait assistance: Mod assist Gait Distance (Feet): 3 Feet Assistive device: Rolling walker (2 wheeled) Gait Pattern/deviations: Shuffle;Decreased step length - right Gait velocity: decr   General  Gait Details: difficulty advancing R LE /picking foot up off ground. Tends to slide right foot. Required multiple cues to perform. Assist needed to keep RW close to him.  Stairs            Wheelchair Mobility    Modified Rankin (Stroke Patients Only)       Balance Overall balance assessment: Needs assistance Sitting-balance support: Feet supported Sitting balance-Leahy Scale: Poor Sitting balance - Comments: initially sitting balance fair, then to poor falling posteriorly and unable to correct without assist. Postural control: Posterior lean Standing balance support: Bilateral upper extremity supported;During functional activity Standing balance-Leahy Scale: Poor Standing balance comment: requires external assist and RW                             Pertinent Vitals/Pain Pain Assessment: Faces Faces Pain Scale: No hurt Pain Intervention(s): Monitored during session    Home Living Family/patient expects to be discharged to:: Skilled nursing facility Living Arrangements: Spouse/significant other                    Prior Function Level of Independence: Needs assistance   Gait / Transfers Assistance Needed: Chart suggests RW use at basline.  ADL's / Homemaking Assistance Needed: Charts states spouse assists with ADL, meds, home management and meals.  Patient cannot drive.        Hand Dominance   Dominant Hand: Right    Extremity/Trunk Assessment   Upper Extremity Assessment Upper Extremity Assessment: Defer to OT evaluation    Lower Extremity Assessment Lower Extremity Assessment: Generalized weakness;RLE deficits/detail RLE Sensation: WNL RLE Coordination: decreased  gross motor    Cervical / Trunk Assessment Cervical / Trunk Assessment: Kyphotic  Communication   Communication: Expressive difficulties  Cognition Arousal/Alertness: Awake/alert Behavior During Therapy: WFL for tasks assessed/performed Overall Cognitive Status: History of  cognitive impairments - at baseline                                        General Comments      Exercises     Assessment/Plan    PT Assessment Patient needs continued PT services  PT Problem List Decreased strength;Decreased mobility;Decreased balance;Decreased safety awareness;Decreased activity tolerance;Decreased coordination       PT Treatment Interventions DME instruction;Therapeutic exercise;Gait training;Balance training;Functional mobility training;Therapeutic activities;Patient/family education    PT Goals (Current goals can be found in the Care Plan section)  Acute Rehab PT Goals Patient Stated Goal: no goals stated PT Goal Formulation: Patient unable to participate in goal setting Time For Goal Achievement: 07/19/20    Frequency Min 3X/week   Barriers to discharge Decreased caregiver support      Co-evaluation   Reason for Co-Treatment: For patient/therapist safety   OT goals addressed during session: ADL's and self-care       AM-PAC PT "6 Clicks" Mobility  Outcome Measure Help needed turning from your back to your side while in a flat bed without using bedrails?: A Lot Help needed moving from lying on your back to sitting on the side of a flat bed without using bedrails?: A Lot Help needed moving to and from a bed to a chair (including a wheelchair)?: A Lot Help needed standing up from a chair using your arms (e.g., wheelchair or bedside chair)?: A Lot Help needed to walk in hospital room?: A Lot Help needed climbing 3-5 steps with a railing? : Total 6 Click Score: 11    End of Session Equipment Utilized During Treatment: Gait belt Activity Tolerance: Patient limited by fatigue Patient left: in chair;with chair alarm set;with call bell/phone within reach Nurse Communication: Mobility status PT Visit Diagnosis: Unsteadiness on feet (R26.81);Other abnormalities of gait and mobility (R26.89);Difficulty in walking, not elsewhere  classified (R26.2)    Time: 9485-4627 PT Time Calculation (min) (ACUTE ONLY): 13 min   Charges:   PT Evaluation $PT Eval Moderate Complexity: 1 Mod          Rilya Longo, PT, G[facility] 07/05/20,12:25 PM

## 2020-07-05 NOTE — Progress Notes (Signed)
STROKE TEAM PROGRESS NOTE   SUBJECTIVE (INTERVAL HISTORY) No family is at the bedside.  Overall his condition is stable. Pt still has mild right sided weakness comparing with left, not sure the exact baseline. However, pt did have psychomotor slowing and cognitive impairment.    OBJECTIVE Temp:  [98.2 F (36.8 C)-98.9 F (37.2 C)] 98.9 F (37.2 C) (05/14 1137) Pulse Rate:  [60-76] 76 (05/14 1137) Cardiac Rhythm: Normal sinus rhythm (05/13 2044) Resp:  [16-18] 18 (05/14 1137) BP: (140-171)/(51-98) 141/70 (05/14 1137) SpO2:  [95 %-98 %] 98 % (05/14 1137) Weight:  [90.7 kg-97 kg] 97 kg (05/14 0447)  Recent Labs  Lab 07/04/20 2125 07/05/20 0735 07/05/20 1130  GLUCAP 119* 98 115*   Recent Labs  Lab 07/04/20 1729 07/04/20 1745 07/05/20 0222  NA 140 140 138  K 4.0 3.9 3.4*  CL 105 104 104  CO2 28  --  25  GLUCOSE 87 83 102*  BUN 16 19 16   CREATININE 0.92 0.90 0.83  CALCIUM 9.3  --  8.7*   Recent Labs  Lab 07/04/20 1729 07/05/20 0222  AST 21 16  ALT 17 15  ALKPHOS 23* 21*  BILITOT 0.5 0.5  PROT 6.3* 5.5*  ALBUMIN 3.6 3.2*   Recent Labs  Lab 07/04/20 1729 07/04/20 1745 07/05/20 0222  WBC 8.2  --  6.8  NEUTROABS 5.3  --   --   HGB 14.4 14.3 12.8*  HCT 44.1 42.0 39.1  MCV 83.5  --  83.2  PLT 266  --  259   No results for input(s): CKTOTAL, CKMB, CKMBINDEX, TROPONINI in the last 168 hours. Recent Labs    07/04/20 1729  LABPROT 13.3  INR 1.0   Recent Labs    07/04/20 1558  COLORURINE YELLOW  LABSPEC 1.016  PHURINE 6.0  GLUCOSEU NEGATIVE  HGBUR NEGATIVE  BILIRUBINUR NEGATIVE  KETONESUR NEGATIVE  PROTEINUR NEGATIVE  NITRITE NEGATIVE  LEUKOCYTESUR LARGE*       Component Value Date/Time   CHOL 143 07/05/2020 0222   TRIG 122 07/05/2020 0222   HDL 32 (L) 07/05/2020 0222   CHOLHDL 4.5 07/05/2020 0222   VLDL 24 07/05/2020 0222   LDLCALC 87 07/05/2020 0222   Lab Results  Component Value Date   HGBA1C 7.2 (H) 06/25/2019      Component Value  Date/Time   LABOPIA NONE DETECTED 07/04/2020 1817   COCAINSCRNUR NONE DETECTED 07/04/2020 1817   LABBENZ NONE DETECTED 07/04/2020 1817   AMPHETMU NONE DETECTED 07/04/2020 1817   THCU NONE DETECTED 07/04/2020 1817   LABBARB NONE DETECTED 07/04/2020 1817    Recent Labs  Lab 07/04/20 1558  ETH <10    I have personally reviewed the radiological images below and agree with the radiology interpretations.  CT HEAD WO CONTRAST  Result Date: 07/04/2020 CLINICAL DATA:  Mental status changes, generalized weakness, confusion, remote infarcts EXAM: CT HEAD WITHOUT CONTRAST TECHNIQUE: Contiguous axial images were obtained from the base of the skull through the vertex without intravenous contrast. COMPARISON:  07/16/2019 FINDINGS: Brain: Stable atrophy pattern and diffuse bilateral white matter microvascular ischemic changes throughout both cerebral hemispheres. Mild associated ventricular enlargement. Remote bilateral basal ganglia infarcts. No new acute intracranial hemorrhage, definite new infarction, midline shift, herniation, or hydrocephalus. No extra-axial fluid collection. Cisterns are patent. Cerebellar unremarkable and stable in appearance. Vascular: No hyperdense vessel or unexpected calcification. Skull: Normal. Negative for fracture or focal lesion. Sinuses/Orbits: No acute finding. Other: None. IMPRESSION: Stable brain atrophy pattern and chronic white matter  microvascular ischemic changes. Remote basal ganglia lacunar type infarcts. No acute intracranial abnormality or interval change by noncontrast CT. Electronically Signed   By: Jerilynn Mages.  Shick M.D.   On: 07/04/2020 16:43   MR BRAIN WO CONTRAST  Result Date: 07/05/2020 CLINICAL DATA:  Initial evaluation for neuro deficit, stroke suspected. EXAM: MRI HEAD WITHOUT CONTRAST TECHNIQUE: Multiplanar, multiecho pulse sequences of the brain and surrounding structures were obtained without intravenous contrast. COMPARISON:  Prior CT from 07/04/2020 and MRI  from 07/16/2019. FINDINGS: Brain: Diffuse prominence of the CSF containing spaces compatible with generalized age-related cerebral atrophy, advanced in nature. Extensive patchy and confluent T2/FLAIR hyperintensity within the periventricular and deep white matter both cerebral hemispheres most consistent with chronic small vessel ischemic disease, also advanced. Multiple remote lacunar infarcts present about the bilateral basal ganglia and corona radiata. Associated chronic hemosiderin staining present at a prominent left basal ganglia lacunar infarct. 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 Small remote lacunar infarcts at the right thalamus and left pons. Appearance is relatively stable from previous. 12 mm focus of diffusion abnormality involving the left frontal corona radiata consistent with an early subacute ischemic infarct (series 5, image 80). Associated T2/FLAIR signal abnormality without significant regional mass effect. Possible minimal associated petechial hemorrhage without hemorrhagic transformation. No other evidence for acute or subacute ischemia. Gray-white matter differentiation otherwise maintained. No acute intracranial hemorrhage. No mass lesion, midline shift, or mass effect. Diffuse ventricular prominence related to global parenchymal volume loss without hydrocephalus. Mild ex vacuo dilatation of the left lateral ventricle related to chronic left basal ganglia ischemic change. No extra-axial fluid collection. Pituitary gland suprasellar region within normal limits. Midline structures intact. Vascular: Major intracranial vascular flow voids are maintained. Skull and upper cervical spine: Craniocervical junction within normal limits. Bone marrow signal intensity normal. No scalp soft tissue abnormality. Sinuses/Orbits: Patient status post ocular lens replacement on the left. Globes and orbital soft tissues demonstrate no acute finding.  Scattered mucosal thickening noted throughout the ethmoidal air cells and maxillary sinuses. Trace left mastoid effusion noted, of doubtful significance. Inner ear structures grossly normal. Other: None. IMPRESSION: 1. 12 mm early subacute ischemic infarct involving the left frontal corona radiata. Possible minimal associated petechial hemorrhage without hemorrhagic transformation or significant mass effect. 2. No other acute intracranial abnormality. 3. Advanced cerebral atrophy with chronic microvascular ischemic disease with multiple remote lacunar infarcts as above. Electronically Signed   By: Jeannine Boga M.D.   On: 07/05/2020 01:25   DG Chest Portable 1 View  Result Date: 07/04/2020 CLINICAL DATA:  Foreign body MRI clearance. EXAM: PORTABLE CHEST 1 VIEW COMPARISON:  06/24/2019 FINDINGS: Overlying monitoring devices/EKG leads in place. No implanted medical device to preclude MRI imaging. Stable heart size and mediastinal contours. Mild elevation of right hemidiaphragm. No acute airspace disease. No pleural fluid or pneumothorax. No acute osseous abnormalities are seen. IMPRESSION: 1. No implanted medical device to preclude MRI imaging. 2. No acute process in the chest. Electronically Signed   By: Keith Rake M.D.   On: 07/04/2020 23:16   DG Abd Portable 1V  Result Date: 07/04/2020 CLINICAL DATA:  Metal screening prior to MRI. EXAM: PORTABLE ABDOMEN - 1 VIEW COMPARISON:  None. FINDINGS: Overlying monitoring devices. There is no implanted medical device to preclude MRI imaging. Right upper quadrant calcification, favor gallstone. Increased air throughout nondilated small bowel. Mild gaseous gastric distension. Bone island in the left iliac bone. No acute osseous abnormalities are seen. IMPRESSION: 1. No implanted  medical device to preclude MRI imaging. 2. Right upper quadrant calcification, favor gallstone. 3. Increased air throughout nondilated small bowel may represent an ileus.  Electronically Signed   By: Narda RutherfordMelanie  Sanford M.D.   On: 07/04/2020 23:15    PHYSICAL EXAM  Temp:  [98.2 F (36.8 C)-98.9 F (37.2 C)] 98.9 F (37.2 C) (05/14 1137) Pulse Rate:  [60-76] 76 (05/14 1137) Resp:  [16-18] 18 (05/14 1137) BP: (140-171)/(51-98) 141/70 (05/14 1137) SpO2:  [95 %-98 %] 98 % (05/14 1137) Weight:  [90.7 kg-97 kg] 97 kg (05/14 0447)  General - Well nourished, well developed, in no apparent distress.  Ophthalmologic - fundi not visualized due to noncooperation.  Cardiovascular - Regular rhythm and rate.  Neuro - awake, alert, eyes open, orientated to place and self, but not to age or time. No aphasia, however, paucity of speech, able to name 1/4 with perseveration, and repeat 3-words sentences without difficulty. Moderate dysarthria. No gaze palsy, tracking bilaterally, visual field full, PERRL. No facial droop. Tongue midline. Slight RUE drift, no drift on the LUE. LLE 3/5 but RLE 2/5 proximal. BLE distal DF 4/5. Sensation and FTN not quite cooperative, gait not tested.    ASSESSMENT/PLAN Justin [name] is a 80 y.o. male with history of hypertension, hyperlipidemia, diabetes, stroke admitted for worsening slurred speech and right-sided weakness. No tPA given due to unclear time onset.    Stroke:  left CR infarct likely secondary to small vessel disease source  MRI left CR infarct  MRA pending  Carotid Doppler unremarkable  2D Echo pending  LDL 87  HgbA1c pending  SCDs for VTE prophylaxis  clopidogrel 75 mg daily prior to admission, now on aspirin 81 mg daily and clopidogrel 75 mg daily DAPT for 3 weeks and then Plavix alone.  Patient counseled to be compliant with his antithrombotic medications  Ongoing aggressive stroke risk factor management  Therapy recommendations: SNF  Disposition: Pending  Hx stroke/TIA  06/2019 admitted for right foot weakness and falls.  MRI showed 2 small right MCA territory infarct at CR and SO, concerning for  small vessel disease, CTA head showed multifocal vascular stenosis including basilar artery, right A2, left P1 and left M2.  Carotid Doppler unremarkable.  No DVT.  EF 70 to 75%.  LDL 48, A1c 7.2.  30-day cardiac event monitoring recommended as outpatient.  Discharged with DAPT for 3 months and then Plavix alone.  Also discharged with Lipitor 80.  09/2018 -admitted for confusion and slurry speech, MRI showed leftcorona radiatainfarct likely due tosmall vessel disease source in setting of intracranial atherosclerosis (L M2 high-grade stenosis).  MRA head and neck showed right P1, bilateral A2, left M2 stenosis.  EF 60 to 65%.  LDL 48 and A1c 6.9.  Put on DAPT x 3 mos then plavix alone.  Also Lipitor 80.  08/2017 admitted for right arm weakness. MRI showed left BG infarct. MRA showed left P1 high-grade stenosis with robust Pcom. Carotid Doppler negative. EF 60 to 65%. LDL 115 and A1c 7.7. Patient discharged with aspirin 325 and Plavix 75 and Lipitor 80.  Diabetes  HgbA1c pending goal < 7.0  Uncontrolled  CBG monitoring  SSI  DM education and close PCP follow up  Hypertension . Stable . Gradually normalized BP within 5-7 days.  Long term BP goal normotensive  Hyperlipidemia  Home meds: Lipitor 80  LDL 87, goal < 70  Now on Lipitor 80  Continue statin at discharge  Other Stroke Risk Factors  Advanced age  Other Active Problems  Slight hypokalemia, K3.4  Hospital day # 1   Rosalin Hawking, MD PhD Stroke Neurology 07/05/2020 11:59 AM    To contact Stroke Continuity provider, please refer to [email or web address] After hours, contact General Neurology

## 2020-07-05 NOTE — Evaluation (Signed)
Occupational Therapy Evaluation Patient Details Name: Justin [name] MRN: [id number] DOB: 05-10-1940 Today's Date: 07/05/2020    History of Present Illness 80 y.o. male with medical history significant of prior CVA baseline left upper extremity lower extremity weakness right lower extremity weakness, hypertension, hyperlipidemia presents with worsening dysarthria and right lower leg weakness.  MRI suggests:early subacute ischemic infarct involving the left frontal  corona radiata.  Patient with UTI as well.   Clinical Impression   Patient admitted for the above diagnosis.  PTA he was living with his spouse, and needing assist with bathing, dressing, meds, community mobility, home management and meal prep.  Patient was using a RW for mobility.  Barriers are listed below.  Per nursing, patient is needing increasing assist at home beyond what his spouse can provide.  Given increasing assist and lack of sufficient help at home, SNF is recommended.  OT will follow to maximize his functional status.      Follow Up Recommendations  SNF    Equipment Recommendations  Wheelchair (measurements OT);Wheelchair cushion (measurements OT)    Recommendations for Other Services       Precautions / Restrictions Precautions Precautions: Fall Restrictions Weight Bearing Restrictions: No Other Position/Activity Restrictions: Dys 2 diet      Mobility Bed Mobility Overal bed mobility: Needs Assistance Bed Mobility: Supine to Sit     Supine to sit: Mod assist;HOB elevated       Patient Response: Cooperative  Transfers Overall transfer level: Needs assistance Equipment used: Rolling walker (2 wheeled) Transfers: Sit to/from [facility] Sit to Stand: Mod assist Stand pivot transfers: Mod assist            Balance Overall balance assessment: Needs assistance Sitting-balance support: Feet supported Sitting balance-Leahy Scale: Poor   Postural control: Posterior  lean Standing balance support: Bilateral upper extremity supported Standing balance-Leahy Scale: Poor Standing balance comment: requires external assist                           ADL either performed or assessed with clinical judgement   ADL Overall ADL's : Needs assistance/impaired Eating/Feeding: Minimal assistance;Sitting   Grooming: Wash/dry hands;Wash/dry face;Minimal assistance;Sitting           Upper Body Dressing : Moderate assistance;Sitting   Lower Body Dressing: Maximal assistance;Sitting/lateral leans               Functional mobility during ADLs: Moderate assistance;Rolling walker       Vision Patient Visual Report: No change from baseline       Perception     Praxis      Pertinent Vitals/Pain Pain Assessment: Faces Faces Pain Scale: No hurt Pain Intervention(s): Monitored during session     Hand Dominance Right   Extremity/Trunk Assessment Upper Extremity Assessment Upper Extremity Assessment: Generalized weakness   Lower Extremity Assessment Lower Extremity Assessment: Defer to PT evaluation   Cervical / Trunk Assessment Cervical / Trunk Assessment: Kyphotic   Communication Communication Communication: Expressive difficulties   Cognition Arousal/Alertness: Awake/alert Behavior During Therapy: WFL for tasks assessed/performed Overall Cognitive Status: History of cognitive impairments - at baseline                                     General Comments    VSS on RA   Exercises     Shoulder Instructions      Home  Living Family/patient expects to be discharged to:: Skilled nursing facility Living Arrangements: Spouse/significant other                                      Prior Functioning/Environment Level of Independence: Needs assistance  Gait / Transfers Assistance Needed: Chart suggests RW use at basline. ADL's / Homemaking Assistance Needed: Charts states spouse assists with ADL,  meds, home management and meals.  Patient cannot drive. Communication / Swallowing Assistance Needed: Mainly one word answers.  Slow processing.          OT Problem List: Decreased strength;Decreased range of motion;Decreased activity tolerance;Impaired balance (sitting and/or standing);Decreased cognition;Impaired UE functional use      OT Treatment/Interventions: Self-care/ADL training;Therapeutic exercise;Balance training;Therapeutic activities    OT Goals(Current goals can be found in the care plan section) Acute Rehab OT Goals OT Goal Formulation: Patient unable to participate in goal setting Time For Goal Achievement: 07/19/20 Potential to Achieve Goals: Fair  OT Frequency: Min 2X/week   Barriers to D/C:    none noted       Co-evaluation PT/OT/SLP Co-Evaluation/Treatment: Yes Reason for Co-Treatment: For patient/therapist safety   OT goals addressed during session: ADL's and self-care      AM-PAC OT "6 Clicks" Daily Activity     Outcome Measure Help from another person eating meals?: A Little Help from another person taking care of personal grooming?: A Little Help from another person toileting, which includes using toliet, bedpan, or urinal?: A Lot Help from another person bathing (including washing, rinsing, drying)?: A Lot Help from another person to put on and taking off regular upper body clothing?: A Lot Help from another person to put on and taking off regular lower body clothing?: A Lot 6 Click Score: 14   End of Session Equipment Utilized During Treatment: Gait belt;Rolling walker Nurse Communication: Mobility status  Activity Tolerance: Patient tolerated treatment well Patient left: in chair;with call bell/phone within reach;with chair alarm set  OT Visit Diagnosis: Unsteadiness on feet (R26.81);Muscle weakness (generalized) (M62.81);Hemiplegia and hemiparesis Hemiplegia - Right/Left: Right Hemiplegia - dominant/non-dominant: Dominant                 Time: 4481-8563 OT Time Calculation (min): 17 min Charges:  OT General Charges $OT Visit: 1 Visit OT Evaluation $OT Eval Moderate Complexity: 1 Mod  07/05/2020  Rich, OTR/L  Acute Rehabilitation Services  Office:  [place] 07/05/2020, 11:57 AM

## 2020-07-05 NOTE — Significant Event (Signed)
Discussed w/ neurology-Dr. Leonel Ramsay through electronic form, cva on mri, continue plavix, f/u neurology recs

## 2020-07-05 NOTE — Consult Note (Signed)
Neurology Consultation Reason for Consult: Stroke  referring Physician: [place], B  CC: Right leg weakness  History is obtained from: Chart review  HPI: Justin [name] is a 80 y.o. male with a history of vascular dementia previous stroke, hypertension, hyperlipidemia, diabetes, CHI who was just recently seen in the stroke clinic on the ninth who presents with right-sided weakness.  He apparently was doing okay last night, but when he awoke he was unable to walk.  He has had some increasing slurred speech for multiple days.  He has speech difficulty at baseline, but it has been slightly worse recently.  Due to his right leg weakness with inability to walk he presented to the emergency department today where head CT was negative.  He was found to have a urinary tract infection and so there was concern for stroke versus recrudescence.  An MRI does confirm new stroke.   LKW: Unclear tpa given?: no, unclear time of onset    ROS:  Unable to obtain due to altered mental status.   Past Medical History:  Diagnosis Date  . Brain bleed ([place])   . Cancer [facility])    prostate  . 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 Depression   . Diabetes mellitus without complication ([place])   . High cholesterol   . Hypertension   . Stroke [facility])      Family History  Problem Relation Age of Onset  . CAD Mother   . Diabetes Mother   . Emphysema Father   . Diabetes Sister   . Diabetes Brother   . Diabetes Sister   . Diabetes Brother   . Diabetes Mellitus II Neg Hx      Social History:  reports that he has never smoked. He has never used smokeless tobacco. He reports that he does not drink alcohol and does not use drugs.   Exam: Current vital signs: BP (!) 155/73 (BP Location: Left Arm)   Pulse 76   Temp 98.7 F (37.1 C) (Oral)   Resp 16   Ht 5\' 11"  (1.803 m)   Wt 90.7 kg   SpO2 98%   BMI 27.89 kg/m  Vital signs in last 24 hours: Temp:  [98.2 F (36.8 C)-98.7 F (37.1 C)] 98.7 F  (37.1 C) (05/13 2125) Pulse Rate:  [71-76] 76 (05/13 2125) Resp:  [16-18] 16 (05/13 2125) BP: (140-171)/(73-98) 155/73 (05/13 2125) SpO2:  [95 %-98 %] 98 % (05/13 2125) Weight:  [90.7 kg] 90.7 kg (05/13 1535)   Physical Exam  Constitutional: Appears well-developed and well-nourished.  Psych: Affect appropriate to situation Eyes: No scleral injection HENT: No OP obstruction MSK: no joint deformities.  Cardiovascular: Normal rate and regular rhythm.  Respiratory: Effort normal, non-labored breathing GI: Soft.  No distension. There is no tenderness.  Skin: WDI  Neuro: Mental Status: Patient is awake, alert, he is severely dysarthric and difficult to understand.  He is able to tell me that it is the fifth month, unable to give the year or his age, he knows he is in the hospital at [facility]  Further history is limited due to severe dysarthria. Cranial Nerves: II: Visual Fields are full. Pupils are equal, round, and reactive to light.   III,IV, VI: EOMI without ptosis or diploplia.  V: Facial sensation is symmetric to temperature VII: Facial movement with mild right facial weakness VIII: hearing is intact to voice X: Uvula elevates symmetrically XI: Shoulder shrug is symmetric. XII: tongue is midline without  atrophy or fasciculations.  Motor: Tone is normal. Bulk is normal. 5/5 strength was present on the left, 4+/5 strength in the right arm 4/5 in the right leg Sensory: Sensation is symmetric to light touch and temperature in the arms and legs. 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  Secondary prevention we will continue to focus on risk factor  modification.  Recommendations: - HgbA1c, fasting lipid panel - Frequent neuro checks - Echocardiogram - Prophylactic therapy-Antiplatelet med: Aspirin - 81mg  daily and plavix 75mg  daily.  - Risk factor modification - Telemetry monitoring - PT consult, OT consult, Speech consult - Stroke team to follow    Roland Rack, MD [facility] [phone]  If 7pm- 7am, please page neurology on call as listed in [place].

## 2020-07-06 ENCOUNTER — Inpatient Hospital Stay (HOSPITAL_COMMUNITY): Payer: No Typology Code available for payment source

## 2020-07-06 DIAGNOSIS — I5031 Acute diastolic (congestive) heart failure: Secondary | ICD-10-CM

## 2020-07-06 LAB — BASIC METABOLIC PANEL
Anion gap: 5 (ref 5–15)
BUN: 12 mg/dL (ref 8–23)
CO2: 27 mmol/L (ref 22–32)
Calcium: 8.9 mg/dL (ref 8.9–10.3)
Chloride: 106 mmol/L (ref 98–111)
Creatinine, Ser: 1.06 mg/dL (ref 0.61–1.24)
GFR, Estimated: >60 mL/min (ref >60)
Glucose, Bld: 116 mg/dL — ABNORMAL HIGH (ref 70–99)
Potassium: 4 mmol/L (ref 3.5–5.1)
Sodium: 138 mmol/L (ref 135–145)

## 2020-07-06 LAB — ECHOCARDIOGRAM COMPLETE
AR max vel: 2.71 cm2
AV Area VTI: 2.73 cm2
AV Area mean vel: 2.65 cm2
AV Mean grad: 4 mmHg
AV Peak grad: 8.5 mmHg
Ao pk vel: 1.46 m/s
Area-P 1/2: 3.76 cm2
Height: 71 in
S' Lateral: 2.4 cm
Weight: 3435.65 oz

## 2020-07-06 LAB — HEMOGLOBIN A1C
Hgb A1c MFr Bld: 6.8 % — ABNORMAL HIGH (ref 4.8–5.6)
Mean Plasma Glucose: 148.46 mg/dL

## 2020-07-06 LAB — GLUCOSE, CAPILLARY
Glucose-Capillary: 102 mg/dL — ABNORMAL HIGH (ref 70–99)
Glucose-Capillary: 107 mg/dL — ABNORMAL HIGH (ref 70–99)
Glucose-Capillary: 116 mg/dL — ABNORMAL HIGH (ref 70–99)

## 2020-07-06 LAB — MAGNESIUM: Magnesium: 2.2 mg/dL (ref 1.7–2.4)

## 2020-07-06 MED ORDER — ASPIRIN EC 325 MG PO TBEC
325.0000 mg | DELAYED_RELEASE_TABLET | Freq: Every day | ORAL | Status: DC
  Administered 2020-07-06 – 2020-07-09 (×4): 325 mg via ORAL
  Filled 2020-07-06 (×4): qty 1

## 2020-07-06 NOTE — Progress Notes (Addendum)
STROKE TEAM PROGRESS NOTE   SUBJECTIVE (INTERVAL HISTORY) Patient seen in room. Wife at bedside providing support. She had questions regarding plan of care. All questions answered to her satisfaction.    OBJECTIVE Temp:  [98.2 F (36.8 C)-98.9 F (37.2 C)] 98.2 F (36.8 C) (05/15 1222) Pulse Rate:  [58-70] 64 (05/15 1222) Cardiac Rhythm: Normal sinus rhythm;Bundle branch block (05/15 0700) Resp:  [14-17] 17 (05/15 1222) BP: (146-151)/(69-120) 146/120 (05/15 1222) SpO2:  [95 %-96 %] 95 % (05/15 1222) Weight:  [97.4 kg] 97.4 kg (05/15 0603)  Recent Labs  Lab 07/05/20 1130 07/05/20 1626 07/05/20 2158 07/06/20 0732 07/06/20 1158  GLUCAP 115* 127* 123* 102* 107*   Recent Labs  Lab 07/04/20 1729 07/04/20 1745 07/05/20 0222 07/06/20 0125  NA 140 140 138 138  K 4.0 3.9 3.4* 4.0  CL 105 104 104 106  CO2 28  --  25 27  GLUCOSE 87 83 102* 116*  BUN 16 19 16 12   CREATININE 0.92 0.90 0.83 1.06  CALCIUM 9.3  --  8.7* 8.9  MG  --   --   --  2.2   Recent Labs  Lab 07/04/20 1729 07/05/20 0222  AST 21 16  ALT 17 15  ALKPHOS 23* 21*  BILITOT 0.5 0.5  PROT 6.3* 5.5*  ALBUMIN 3.6 3.2*   Recent Labs  Lab 07/04/20 1729 07/04/20 1745 07/05/20 0222  WBC 8.2  --  6.8  NEUTROABS 5.3  --   --   HGB 14.4 14.3 12.8*  HCT 44.1 42.0 39.1  MCV 83.5  --  83.2  PLT 266  --  259   No results for input(s): CKTOTAL, CKMB, CKMBINDEX, TROPONINI in the last 168 hours. Recent Labs    07/04/20 1729  LABPROT 13.3  INR 1.0   Recent Labs    07/04/20 1558  COLORURINE YELLOW  LABSPEC 1.016  PHURINE 6.0  GLUCOSEU NEGATIVE  HGBUR NEGATIVE  BILIRUBINUR NEGATIVE  KETONESUR NEGATIVE  PROTEINUR NEGATIVE  NITRITE NEGATIVE  LEUKOCYTESUR LARGE*       Component Value Date/Time   CHOL 143 07/05/2020 0222   TRIG 122 07/05/2020 0222   HDL 32 (L) 07/05/2020 0222   CHOLHDL 4.5 07/05/2020 0222   VLDL 24 07/05/2020 0222   LDLCALC 87 07/05/2020 0222   Lab Results  Component Value Date    HGBA1C 6.8 (H) 07/06/2020      Component Value Date/Time   LABOPIA NONE DETECTED 07/04/2020 1817   COCAINSCRNUR NONE DETECTED 07/04/2020 1817   LABBENZ NONE DETECTED 07/04/2020 1817   AMPHETMU NONE DETECTED 07/04/2020 1817   THCU NONE DETECTED 07/04/2020 1817   LABBARB NONE DETECTED 07/04/2020 1817    Recent Labs  Lab 07/04/20 1558  ETH <10    I have personally reviewed the radiological images below and agree with the radiology interpretations.  CT HEAD WO CONTRAST  Result Date: 07/04/2020 CLINICAL DATA:  Mental status changes, generalized weakness, confusion, remote infarcts EXAM: CT HEAD WITHOUT CONTRAST TECHNIQUE: Contiguous axial images were obtained from the base of the skull through the vertex without intravenous contrast. COMPARISON:  07/16/2019 FINDINGS: Brain: Stable atrophy pattern and diffuse bilateral white matter microvascular ischemic changes throughout both cerebral hemispheres. Mild associated ventricular enlargement. Remote bilateral basal ganglia infarcts. No new acute intracranial hemorrhage, definite new infarction, midline shift, herniation, or hydrocephalus. No extra-axial fluid collection. Cisterns are patent. Cerebellar unremarkable and stable in appearance. Vascular: No hyperdense vessel or unexpected calcification. Skull: Normal. Negative for fracture or focal lesion.  Sinuses/Orbits: No acute finding. Other: None. IMPRESSION: Stable brain atrophy pattern and chronic white matter microvascular ischemic changes. Remote basal ganglia lacunar type infarcts. No acute intracranial abnormality or interval change by noncontrast CT. Electronically Signed   By: Jerilynn Mages.  Shick M.D.   On: 07/04/2020 16:43   MR ANGIO HEAD WO CONTRAST  Result Date: 07/05/2020 CLINICAL DATA:  Stroke, follow-up. EXAM: MRA HEAD WITHOUT CONTRAST TECHNIQUE: Angiographic images of the Circle of Willis were acquired using MRA technique without intravenous contrast. COMPARISON:  Brain MRI 07/05/2020. CT  angiogram head 06/24/2019. FINDINGS: The examination is limited by significant image noise artifact. Anterior circulation: The intracranial internal carotid arteries are patent. The M1 middle cerebral arteries are patent. Atherosclerotic irregularity of the M2 and more distal middle cerebral arteries bilaterally. Most notably, there are apparent progressive moderate/severe stenoses at the origins of both the superior and inferior division proximal left M2 branches (series 252, image 7). The anterior cerebral arteries are patent. Sites of progressive moderate/severe stenosis within the A2/A3 anterior cerebral arteries bilaterally. No intracranial aneurysm is identified. Posterior circulation: The intracranial vertebral arteries and very proximal basilar artery are excluded from the field of view. The visualized basilar artery is patent without significant stenosis. 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 The posterior cerebral arteries are patent. Redemonstrated severe stenosis within the P1 left posterior cerebral artery. A sizable left posterior communicating artery is present and patent. Additional atherosclerotic irregularity of the left posterior cerebral artery. Most notably, there is a severe stenosis within a left PCA P4 branch, better appreciated on the prior CTA of 06/24/2019. Redemonstrated mild/moderate stenosis within the right PCA at the P1/P2 junction. Additional atherosclerotic irregularity of the right PCA. Most notably, a moderate/severe stenosis within a right PCA branch at the P3/P4 junction was better appreciated on the prior CTA. The right posterior communicating artery is hypoplastic or absent. Anatomic variants: As described IMPRESSION: Examination limited by image noise artifact. Additionally, the intracranial vertebral arteries and very proximal basilar artery are excluded from the field of view. Progressive moderate/severe stenoses at the origin of both the superior and  inferior division left M2 MCA vessels. Progressive moderate/severe stenoses within the A2/A3 anterior cerebral arteries bilaterally. These apparent stenoses could potentially be exaggerated by image noise artifact on the current exam. Otherwise, no significant interval change as compared to the CTA head of 06/24/2019. Atherosclerotic irregularity of both posterior cerebral arteries, most notably as follows. Severe stenosis within the P1 left posterior cerebral artery (a sizable left posterior communicating artery is present and patent). Severe stenosis within a left PCA P4 branch. Mild/moderate stenosis within the right PCA at the P1/P2 junction. Moderate/severe stenosis within a right PCA branch at the P3/P4 junction. Electronically Signed   By: Kellie Simmering DO   On: 07/05/2020 17:10   MR BRAIN WO CONTRAST  Result Date: 07/05/2020 CLINICAL DATA:  Initial evaluation for neuro deficit, stroke suspected. EXAM: MRI HEAD WITHOUT CONTRAST TECHNIQUE: Multiplanar, multiecho pulse sequences of the brain and surrounding structures were obtained without intravenous contrast. COMPARISON:  Prior CT from 07/04/2020 and MRI from 07/16/2019. FINDINGS: Brain: Diffuse prominence of the CSF containing spaces compatible with generalized age-related cerebral atrophy, advanced in nature. Extensive patchy and confluent T2/FLAIR hyperintensity within the periventricular and deep white matter both cerebral hemispheres most consistent with chronic small vessel ischemic disease, also advanced. Multiple remote lacunar infarcts present about the bilateral basal ganglia and corona radiata. Associated chronic hemosiderin staining present at a prominent left basal ganglia lacunar infarct. 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 Small remote lacunar infarcts at the right thalamus and left pons. Appearance is relatively stable from previous. 12 mm focus of diffusion abnormality involving the  left frontal corona radiata consistent with an early subacute ischemic infarct (series 5, image 80). Associated T2/FLAIR signal abnormality without significant regional mass effect. Possible minimal associated petechial hemorrhage without hemorrhagic transformation. No other evidence for acute or subacute ischemia. Gray-white matter differentiation otherwise maintained. No acute intracranial hemorrhage. No mass lesion, midline shift, or mass effect. Diffuse ventricular prominence related to global parenchymal volume loss without hydrocephalus. Mild ex vacuo dilatation of the left lateral ventricle related to chronic left basal ganglia ischemic change. No extra-axial fluid collection. Pituitary gland suprasellar region within normal limits. Midline structures intact. Vascular: Major intracranial vascular flow voids are maintained. Skull and upper cervical spine: Craniocervical junction within normal limits. Bone marrow signal intensity normal. No scalp soft tissue abnormality. Sinuses/Orbits: Patient status post ocular lens replacement on the left. Globes and orbital soft tissues demonstrate no acute finding. Scattered mucosal thickening noted throughout the ethmoidal air cells and maxillary sinuses. Trace left mastoid effusion noted, of doubtful significance. Inner ear structures grossly normal. Other: None. IMPRESSION: 1. 12 mm early subacute ischemic infarct involving the left frontal corona radiata. Possible minimal associated petechial hemorrhage without hemorrhagic transformation or significant mass effect. 2. No other acute intracranial abnormality. 3. Advanced cerebral atrophy with chronic microvascular ischemic disease with multiple remote lacunar infarcts as above. Electronically Signed   By: Jeannine Boga M.D.   On: 07/05/2020 01:25   DG Chest Portable 1 View  Result Date: 07/04/2020 CLINICAL DATA:  Foreign body MRI clearance. EXAM: PORTABLE CHEST 1 VIEW COMPARISON:  06/24/2019 FINDINGS:  Overlying monitoring devices/EKG leads in place. No implanted medical device to preclude MRI imaging. Stable heart size and mediastinal contours. Mild elevation of right hemidiaphragm. No acute airspace disease. No pleural fluid or pneumothorax. No acute osseous abnormalities are seen. IMPRESSION: 1. No implanted medical device to preclude MRI imaging. 2. No acute process in the chest. Electronically Signed   By: Keith Rake M.D.   On: 07/04/2020 23:16   DG Abd Portable 1V  Result Date: 07/04/2020 CLINICAL DATA:  Metal screening prior to MRI. EXAM: PORTABLE ABDOMEN - 1 VIEW COMPARISON:  None. FINDINGS: Overlying monitoring devices. There is no implanted medical device to preclude MRI imaging. Right upper quadrant calcification, favor gallstone. Increased air throughout nondilated small bowel. Mild gaseous gastric distension. Bone island in the left iliac bone. No acute osseous abnormalities are seen. IMPRESSION: 1. No implanted medical device to preclude MRI imaging. 2. Right upper quadrant calcification, favor gallstone. 3. Increased air throughout nondilated small bowel may represent an ileus. Electronically Signed   By: Keith Rake M.D.   On: 07/04/2020 23:15   ECHOCARDIOGRAM COMPLETE  Result Date: 07/06/2020    ECHOCARDIOGRAM REPORT   Patient Name:   Justin [name] Date of Exam: 07/06/2020 Medical Rec #:  [id number]     Height:       71.0 in Accession #:    [id number]    Weight:       214.7 lb Date of Birth:  [birth year]    BSA:          2.173 m Patient Age:    80 years      BP:           151/69 mmHg Patient Gender: M  HR:           63 bpm. Exam Location:  Inpatient Procedure: 2D Echo, Cardiac Doppler and Color Doppler Indications:    CHF-Acute Diastolic [id number]  History:        Patient has prior history of Echocardiogram examinations, most                 recent 06/25/2019.  Sonographer:    Merrie Roof Referring Phys: [id number] [place]  1. Left ventricular ejection  fraction, by estimation, is 60 to 65%. The left ventricle has normal function. The left ventricle has no regional wall motion abnormalities. There is mild asymmetric left ventricular hypertrophy of the septal segment. Left ventricular diastolic parameters are consistent with Grade I diastolic dysfunction (impaired relaxation).  2. Right ventricular systolic function is normal. The right ventricular size is mildly enlarged. Tricuspid regurgitation signal is inadequate for assessing PA pressure.  3. The mitral valve is normal in structure. No evidence of mitral valve regurgitation. No evidence of mitral stenosis.  4. The aortic valve is tricuspid. There is moderate calcification of the aortic valve. There is moderate thickening of the aortic valve. Aortic valve regurgitation is trivial. Mild to moderate aortic valve sclerosis/calcification is present, without any  evidence of aortic stenosis. Aortic valve area, by VTI measures 2.73 cm. Aortic valve mean gradient measures 4.0 mmHg. Aortic valve Vmax measures 1.46 m/s. FINDINGS  Left Ventricle: Left ventricular ejection fraction, by estimation, is 60 to 65%. The left ventricle has normal function. The left ventricle has no regional wall motion abnormalities. The left ventricular internal cavity size was normal in size. There is  mild asymmetric left ventricular hypertrophy of the septal segment. Left ventricular diastolic parameters are consistent with Grade I diastolic dysfunction (impaired relaxation). Normal left ventricular filling pressure. Right Ventricle: The right ventricular size is mildly enlarged. No increase in right ventricular wall thickness. Right ventricular systolic function is normal. Tricuspid regurgitation signal is inadequate for assessing PA pressure. Left Atrium: Left atrial size was normal in size. Right Atrium: Right atrial size was normal in size. Pericardium: There is no evidence of pericardial effusion. Mitral Valve: The mitral valve is  normal in structure. Mild mitral annular calcification. No evidence of mitral valve regurgitation. No evidence of mitral valve stenosis. Tricuspid Valve: The tricuspid valve is normal in structure. Tricuspid valve regurgitation is not demonstrated. No evidence of tricuspid stenosis. Aortic Valve: The aortic valve is tricuspid. There is moderate calcification of the aortic valve. There is moderate thickening of the aortic valve. Aortic valve regurgitation is trivial. Mild to moderate aortic valve sclerosis/calcification is present, without any evidence of aortic stenosis. Aortic valve mean gradient measures 4.0 mmHg. Aortic valve peak gradient measures 8.5 mmHg. Aortic valve area, by VTI measures 2.73 cm. Pulmonic Valve: The pulmonic valve was normal in structure. Pulmonic valve regurgitation is not visualized. No evidence of pulmonic stenosis. Aorta: The aortic root is normal in size and structure. IAS/Shunts: No atrial level shunt detected by color flow Doppler.  LEFT VENTRICLE PLAX 2D LVIDd:         4.00 cm  Diastology LVIDs:         2.40 cm  LV e' medial:    7.72 cm/s LV PW:         1.10 cm  LV E/e' medial:  10.8 LV IVS:        1.40 cm  LV e' lateral:   5.98 cm/s LVOT diam:     2.00 cm  LV E/e' lateral: 13.9 LV SV:         79 LV SV Index:   36 LVOT Area:     3.14 cm  RIGHT VENTRICLE RV Basal diam:  4.10 cm RV Mid diam:    2.80 cm LEFT ATRIUM           Index       RIGHT ATRIUM           Index LA diam:      2.30 cm 1.06 cm/m  RA Area:     12.10 cm LA Vol (A2C): 57.8 ml 26.60 ml/m RA Volume:   24.20 ml  11.14 ml/m LA Vol (A4C): 62.5 ml 28.76 ml/m  AORTIC VALVE AV Area (Vmax):    2.71 cm AV Area (Vmean):   2.65 cm AV Area (VTI):     2.73 cm AV Vmax:           146.00 cm/s AV Vmean:          97.100 cm/s AV VTI:            0.289 m AV Peak Grad:      8.5 mmHg AV Mean Grad:      4.0 mmHg LVOT Vmax:         126.00 cm/s LVOT Vmean:        81.900 cm/s LVOT VTI:          0.251 m LVOT/AV VTI ratio: 0.87  AORTA Ao  Root diam: 3.70 cm Ao Asc diam:  3.10 cm MITRAL VALVE MV Area (PHT): 3.76 cm     SHUNTS MV Decel Time: 202 msec     Systemic VTI:  0.25 m MV E velocity: 83.10 cm/s   Systemic Diam: 2.00 cm MV A velocity: 102.00 cm/s MV E/A ratio:  0.81 Justin Him MD Electronically signed by Justin Him MD Signature Date/Time: 07/06/2020/12:26:30 PM    Final    VAS US CAROTID (at [facility] and [facility] only)  Result Date: 07/05/2020 Carotid Arterial Duplex Study Patient Name:  Justin [name]  Date of Exam:   07/05/2020 Medical Rec #: [id number]      Accession #:    [id number] Date of Birth: 12-Jun-1940     Patient Gender: M Patient Age:   79Y Exam Location:  [facility] Procedure:      VAS US CAROTID Referring Phys: [id number] [place] --------------------------------------------------------------------------------  Indications:       CVA, Speech disturbance and Weakness. Risk Factors:      Hypertension, hyperlipidemia, Diabetes. Other Factors:     Vascular dementia. Comparison Study:  Prior carotid duplex indicating 1-39% ICA stenosis, done                    06/25/19 Performing Technologist: Sharion Dove RVS  Examination Guidelines: A complete evaluation includes B-mode imaging, spectral Doppler, color Doppler, and power Doppler as needed of all accessible portions of each vessel. Bilateral testing is considered an integral part of a complete examination. Limited examinations for reoccurring indications may be performed as noted.  Right Carotid Findings: +----------+--------+--------+--------+----------------------+--------+           PSV cm/sEDV cm/sStenosisPlaque Description    Comments +----------+--------+--------+--------+----------------------+--------+ CCA Prox  67      7                                              +----------+--------+--------+--------+----------------------+--------+  CCA Distal52      11                                              +----------+--------+--------+--------+----------------------+--------+ ICA Prox  57      15              irregular and calcific         +----------+--------+--------+--------+----------------------+--------+ ICA Mid   45      13                                             +----------+--------+--------+--------+----------------------+--------+ ICA Distal78      19                                             +----------+--------+--------+--------+----------------------+--------+ ECA       125     15                                             +----------+--------+--------+--------+----------------------+--------+ +----------+--------+-------+--------+-------------------+           PSV cm/sEDV cmsDescribeArm Pressure (mmHG) +----------+--------+-------+--------+-------------------+ [id number]                                         +----------+--------+-------+--------+-------------------+ +---------+--------+--+--------+-+ VertebralPSV cm/s42EDV cm/s5 +---------+--------+--+--------+-+  Left Carotid Findings: +----------+--------+--------+--------+------------------+------------------+           PSV cm/sEDV cm/sStenosisPlaque DescriptionComments           +----------+--------+--------+--------+------------------+------------------+ CCA Prox  125     12                                                   +----------+--------+--------+--------+------------------+------------------+ CCA Distal72      12                                intimal thickening +----------+--------+--------+--------+------------------+------------------+ ICA Prox  64      13              calcific                             +----------+--------+--------+--------+------------------+------------------+ ICA Distal65      18                                                   +----------+--------+--------+--------+------------------+------------------+ ECA       120      14                                                   +----------+--------+--------+--------+------------------+------------------+ +----------+--------+--------+--------+-------------------+  PSV cm/sEDV cm/sDescribeArm Pressure (mmHG) +----------+--------+--------+--------+-------------------+ [id number]      4                                   +----------+--------+--------+--------+-------------------+ +---------+--------+--+--------+-+ VertebralPSV cm/s26EDV cm/s4 +---------+--------+--+--------+-+   Summary: Right Carotid: Velocities in the right ICA are consistent with a 1-39% stenosis. Left Carotid: Velocities in the left ICA are consistent with a 1-39% stenosis. Vertebrals:  Bilateral vertebral arteries demonstrate antegrade flow. Subclavians: Normal flow hemodynamics were seen in bilateral subclavian              arteries. *See table(s) above for measurements and observations.     Preliminary     PHYSICAL EXAM  Temp:  [98.2 F (36.8 C)-98.9 F (37.2 C)] 98.2 F (36.8 C) (05/15 1222) Pulse Rate:  [58-70] 64 (05/15 1222) Resp:  [14-17] 17 (05/15 1222) BP: (146-151)/(69-120) 146/120 (05/15 1222) SpO2:  [95 %-96 %] 95 % (05/15 1222) Weight:  [97.4 kg] 97.4 kg (05/15 0603)  General - Well nourished, well developed, in no apparent distress  Cardiovascular - Regular rhythm and rate.  Neuro - awake, alert, eyes open, orientated to place and self, but not to age or time.   paucity of speech noyrf , repeat 3-words sentences without difficulty. Moderate dysarthria. No gaze palsy, tracking bilaterally, visual field full, PERRL. No facial droop. Tongue midline. Slight RUE drift, no drift on the LUE. LLE 3/5 but RLE 2/5 proximal. BLE distal DF 4/5. Sensation and FTN not quite cooperative, gait not tested.    ASSESSMENT/PLAN [name] is a 80 y.o. male with history of hypertension, hyperlipidemia, diabetes, stroke admitted for worsening slurred speech and  right-sided weakness. No tPA given due to unclear time onset.    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 Progressive moderate/severe stenoses within the A2/A3 anterior cerebral arteries bilaterally.     Carotid Doppler unremarkable  2D Echo EF 60 to 65%  LDL 87  HgbA1c 6.8  SCDs for VTE prophylaxis  clopidogrel 75 mg daily prior to admission, now on aspirin 325 mg daily and clopidogrel 75 mg daily DAPT for 3 months and then Plavix alone given intracranial stenosis.  Patient counseled to be compliant with his antithrombotic medications  Ongoing aggressive stroke risk factor management  Therapy recommendations: SNF  Disposition: Pending  Hx stroke/TIA  06/2019 admitted for right foot weakness and falls.  MRI showed 2 small right MCA territory infarct at CR and SO, concerning for small vessel disease, CTA head showed multifocal vascular stenosis including basilar artery, right A2, left P1 and left M2.  Carotid Doppler unremarkable.  No DVT.  EF 70 to 75%.  LDL 48, A1c 7.2.  30-day cardiac event monitoring recommended as outpatient.  Discharged with DAPT for 3 months and then Plavix alone.  Also discharged with Lipitor 80.  09/2018 -admitted for confusion and slurry speech, MRI showedleftcorona radiatainfarct likely due tosmall vessel disease sourcein setting of intracranial atherosclerosis (L M2 high-grade stenosis).MRA head and neck showed right P1, bilateral A2, left M2 stenosis. EF 60 to 65%. LDL 48 and A1c 6.9. Put on DAPT x 3 mos then plavix alone. Also Lipitor 80.  08/2017 admitted for right arm weakness. MRI showed left BG infarct. MRA showed left P1 high-grade  stenosis with robust Pcom. Carotid Doppler negative. EF 60 to 65%. LDL 115 and A1c 7.7. Patient discharged with aspirin 325 and  Plavix 75 and Lipitor 80.  Diabetes  HgbA1c 6.8 goal < 7.0  controlled  CBG monitoring  SSI  DM education and close PCP follow up  Hypertension  Stable  Gradually normalized BP within 5-7 days.  Long term BP goal normotensive  Hyperlipidemia  Home meds: Lipitor 80 and fenofibrate 160  LDL 87, goal < 70  Now resumed Lipitor 80 and fenofibrate  Continue statin at discharge  Other Stroke Risk Factors  Advanced age  Other Active Problems  Slight hypokalemia, K3.4  Hospital day # 2  Neurology will sign off. Please call with questions. Pt will follow up with stroke clinic NP at [facility] in about 4 weeks. Thanks for the consult.   Rosalin Hawking, MD PhD Stroke Neurology 07/06/2020 4:29 PM      To contact Stroke Continuity provider, please refer to [email or web address] After hours, contact General Neurology

## 2020-07-06 NOTE — Progress Notes (Signed)
  Echocardiogram 2D Echocardiogram has been performed.  Justin [name] F 07/06/2020, 12:00 PM

## 2020-07-06 NOTE — NC FL2 (Addendum)
[place] LEVEL OF CARE SCREENING TOOL     IDENTIFICATION  Patient Name: Justin [name] Birthdate: [birth year] Sex: male Admission Date (Current Location): 07/04/2020  [facility] and [place] Number:  Herbalist and Address:  [facility], [place] [address] [place], [place] [address]      Provider Number: [id number]  Attending Physician Name and Address:  Jonetta Osgood, MD  Relative Name and Phone Number:  Denson, Niccoli [phone]  [phone]    Current Level of Care: Hospital Recommended Level of Care: [place] Prior Approval Number:    Date Approved/Denied:   PASRR Number:   [id number] E expires 08/07/20  Discharge Plan: SNF    Current Diagnoses: Patient Active Problem List   Diagnosis Date Noted  . UTI (urinary tract infection) 07/04/2020  . Right leg weakness 07/04/2020  . Bacteria in urine 07/04/2020  . Labile blood glucose   . 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 OSA (obstructive sleep apnea)   . Controlled type 2 diabetes mellitus with hyperglycemia, without long-term current use of insulin ([place])   . CVA (cerebral vascular accident) ([place]) 07/16/2019  . Dyslipidemia   . Benign essential HTN   . Diabetes mellitus type 2 in obese ([place])   . Major depressive disorder, recurrent episode, moderate ([place])   . Gait disorder   . Acute right MCA stroke ([place]) 06/28/2019  . Ambulatory dysfunction   . Stroke ([place]) 06/24/2019  . Type 2 diabetes mellitus ([place]) 10/04/2018  . Depression 10/04/2018  . Acute CVA (cerebrovascular accident) ([place]) 09/18/2017  . Stroke (cerebrum) ([place]) 09/17/2017  . Diabetes mellitus without complication ([place]) 00/93/8182  . Closed head injury 04/22/2013  . HLD (hyperlipidemia) 07/29/2009  . Essential hypertension 07/29/2009  . EFFUSION, PLEURAL 07/29/2009  . DIVERTICULITIS, COLON 07/29/2009  . 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 COLONIC POLYPS, HYPERPLASTIC,  HX OF 07/29/2009    Orientation RESPIRATION BLADDER Height & Weight     Self,Place  Normal Incontinent Weight: 214 lb 11.7 oz (97.4 kg) Height:  5\' 11"  (180.3 cm)  BEHAVIORAL SYMPTOMS/MOOD NEUROLOGICAL BOWEL NUTRITION STATUS      Continent Diet (DYS 3, see discharge summary)  AMBULATORY STATUS COMMUNICATION OF NEEDS Skin   Limited Assist Does not communicate Normal                       Personal Care Assistance Level of Assistance  Bathing,Feeding,Dressing Bathing Assistance: Maximum assistance Feeding assistance: Limited assistance Dressing Assistance: Maximum assistance     Functional Limitations Info  Sight,Hearing,Speech Sight Info: Adequate Hearing Info: Adequate Speech Info: Impaired    SPECIAL CARE FACTORS FREQUENCY  PT (By licensed PT),OT (By licensed OT)     PT Frequency: 5x week OT Frequency: 5x week            Contractures Contractures Info: Not present    Additional Factors Info  Code Status,Allergies,Insulin Sliding Scale Code Status Info: DNR Allergies Info: NKA   Insulin Sliding Scale Info: novolog, 0-15 units 3x day with meals.  See discharge summary       Current Medications (07/06/2020):  This is the current hospital active medication list Current Facility-Administered Medications  Medication Dose Route Frequency Provider Last Rate Last Admin  . acetaminophen (TYLENOL) tablet 650 mg  650 mg Oral Q4H PRN Howington, Einar Pheasant, MD       Or  . acetaminophen (TYLENOL) 160 MG/5ML solution 650 mg  650 mg Per Tube  Q4H PRN Howington, Einar Pheasant, MD       Or  . acetaminophen (TYLENOL) suppository 650 mg  650 mg Rectal Q4H PRN Howington, Einar Pheasant, MD      . aspirin EC tablet 325 mg  325 mg Oral Daily Rosalin Hawking, MD   325 mg at 07/06/20 0842  . atorvastatin (LIPITOR) tablet 80 mg  80 mg Oral Daily Howington, Einar Pheasant, MD   80 mg at 07/06/20 0842  . chlorhexidine (PERIDEX) 0.12 % solution 15 mL  15 mL Mouth Rinse BID Jonetta Osgood, MD   15 mL at  07/06/20 0842  . clopidogrel (PLAVIX) tablet 75 mg  75 mg Oral Daily Howington, Einar Pheasant, MD   75 mg at 07/06/20 0842  . divalproex (DEPAKOTE ER) 24 hr tablet 250 mg  250 mg Oral Daily Howington, Einar Pheasant, MD   250 mg at 07/06/20 0842  . fenofibrate tablet 160 mg  160 mg Oral Daily Howington, Einar Pheasant, MD   160 mg at 07/06/20 0842  . insulin aspart (novoLOG) injection 0-15 Units  0-15 Units Subcutaneous TID WC Howington, Einar Pheasant, MD   2 Units at 07/05/20 1656  . LORazepam (ATIVAN) injection 1 mg  1 mg Intravenous Once PRN Drenda Freeze, MD      . MEDLINE mouth rinse  15 mL Mouth Rinse q12n4p Jonetta Osgood, MD   15 mL at 07/06/20 1400  . polyvinyl alcohol (LIQUIFILM TEARS) 1.4 % ophthalmic solution 1 drop  1 drop Both Eyes QID PRN Howington, Einar Pheasant, MD      . senna-docusate (Senokot-S) tablet 1 tablet  1 tablet Oral QHS PRN Howington, Einar Pheasant, MD      . sertraline (ZOLOFT) tablet 25 mg  25 mg Oral QHS Howington, Einar Pheasant, MD   25 mg at 07/05/20 2218     Discharge Medications: Please see discharge summary for a list of discharge medications.  Relevant Imaging Results:  Relevant Lab Results:   Additional Information SSN [id number].  Pt is vaccinated for covid and boosted.  Joanne Chars, LCSW

## 2020-07-06 NOTE — TOC Initial Note (Signed)
Transition of Care [facility]) - Initial/Assessment Note    Patient Details  Name: Justin [name] MRN: [id number] Date of Birth: [birth year]  Transition of Care [facility]) CM/SW Contact:    Joanne Chars, LCSW Phone Number: 07/06/2020, 2:16 PM  Clinical Narrative: CSW met with pt and wife to discuss discharge plan.  Pt unable to participate, all information from wife.  She is agreeable to referral for SNF, they live in [place] near [facility], which would be their first choice.  Pt receives primary care at [facility].  [place] services were recently started, wife unable to recall which agency, nothing in patient ping.  Wife also reports [place] aide service through the [place] twice a week.  Pt is vaccinated for covid and boosted.                   Expected Discharge Plan: Skilled Nursing Facility Barriers to Discharge: Continued Medical Work up,SNF Pending bed offer   Patient Goals and CMS Choice Patient states their goals for this hospitalization and ongoing recovery are:: "get him back on his feet" CMS Medicare.gov Compare Post Acute Care list provided to:: Patient Represenative (must comment) Choice offered to / list presented to : Spouse  Expected Discharge Plan and Services Expected Discharge Plan: [place] Acute Care Choice: [place] Living arrangements for the past 2 months: Single Family Home                                      Prior Living Arrangements/Services Living arrangements for the past 2 months: Single Family Home Lives with:: Spouse Patient language and need for interpreter reviewed:: No        Need for Family Participation in Patient Care: Yes (Comment) Care giver support system in place?: Yes (comment) Current home services: Home PT Criminal Activity/Legal Involvement Pertinent to Current Situation/Hospitalization: No - Comment as needed  Activities of Daily Living Home Assistive Devices/Equipment: Walker  (specify type) ADL Screening (condition at time of admission) Patient's cognitive ability adequate to safely complete daily activities?: Yes Is the patient deaf or have difficulty hearing?: No Does the patient have difficulty seeing, even when wearing glasses/contacts?: No Does the patient have difficulty concentrating, remembering, or making decisions?: No Patient able to express need for assistance with ADLs?: Yes Does the patient have difficulty dressing or bathing?: Yes Independently performs ADLs?: No Communication: Independent Dressing (OT): Needs assistance Is this a change from baseline?: Pre-admission baseline Grooming: Needs assistance Is this a change from baseline?: Pre-admission baseline Feeding: Independent Bathing: Needs assistance Is this a change from baseline?: Pre-admission baseline Toileting: Needs assistance Is this a change from baseline?: Pre-admission baseline In/Out Bed: Needs assistance Is this a change from baseline?: Pre-admission baseline Walks in Home: Needs assistance Is this a change from baseline?: Pre-admission baseline Does the patient have difficulty walking or climbing stairs?: Yes Weakness of Legs: Both Weakness of Arms/Hands: None  Permission Sought/Granted                  Emotional Assessment Appearance:: Appears stated age Attitude/Demeanor/Rapport: Unresponsive,Unable to Assess Affect (typically observed): Unable to Assess Orientation: : Oriented to Self,Oriented to Place Alcohol / Substance Use: Not Applicable Psych Involvement: No (comment)  Admission diagnosis:  UTI (urinary tract infection) [N39.0] Right leg weakness [R29.898] Patient Active Problem List   Diagnosis Date Noted  . UTI (urinary tract  infection) 07/04/2020  . Right leg weakness 07/04/2020  . Bacteria in urine 07/04/2020  . Labile blood glucose   . 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 OSA (obstructive sleep apnea)   . Controlled type 2  diabetes mellitus with hyperglycemia, without long-term current use of insulin ([place])   . CVA (cerebral vascular accident) ([place]) 07/16/2019  . Dyslipidemia   . Benign essential HTN   . Diabetes mellitus type 2 in obese ([place])   . Major depressive disorder, recurrent episode, moderate ([place])   . Gait disorder   . Acute right MCA stroke ([place]) 06/28/2019  . Ambulatory dysfunction   . Stroke ([place]) 06/24/2019  . Type 2 diabetes mellitus ([place]) 10/04/2018  . Depression 10/04/2018  . Acute CVA (cerebrovascular accident) ([place]) 09/18/2017  . Stroke (cerebrum) ([place]) 09/17/2017  . Diabetes mellitus without complication ([place]) 96/72/2773  . Closed head injury 04/22/2013  . HLD (hyperlipidemia) 07/29/2009  . Essential hypertension 07/29/2009  . EFFUSION, PLEURAL 07/29/2009  . DIVERTICULITIS, COLON 07/29/2009  . 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 COLONIC POLYPS, HYPERPLASTIC, HX OF 07/29/2009   PCP:  Chesley Noon, MD Pharmacy:   [id number] [place], [place] - [address] [place] [id number]: [phone]: [phone]    Social Determinants of Health (SDOH) Interventions    Readmission Risk Interventions No flowsheet data found.

## 2020-07-06 NOTE — Progress Notes (Signed)
    RE:  Justin [name]       Date of Birth: May 04, 2040      Date:  07/06/20        To Whom It May Concern:  Please be advised that the above-named patient will require a short-term nursing home stay - anticipated 30 days or less for rehabilitation and strengthening.  The plan is for return home.                 MD signature                Date

## 2020-07-06 NOTE — Progress Notes (Signed)
PROGRESS NOTE        PATIENT DETAILS Name: Justin [name] Age: 80 y.o. Sex: male Date of Birth: [birth year] Admit Date: 07/04/2020 Admitting Physician Jacqlyn Krauss, MD PPI:RJJOAC, Rebeca Alert, MD  Brief Narrative: Patient is a 80 y.o. male with history of prior CVA, HTN, HLD, DM-2-who presented with worsening right leg weakness-further evaluation revealed acute CVA.  Significant events: 5/13>> admitted with right leg weakness-found to have acute CVA.  Significant studies: 06/25/2019>> A1c 7.2 5/13>> chest x-ray: No pneumonia. 5/13>> CT head: No acute intracranial abnormality. 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 5/14>> LDL: 87 5/15>> A1c: 6.8   Antimicrobial therapy: None  Microbiology data: 5/13>> COVID/influenza PCR: Negative  Procedures : None  Consults: Neurology  DVT Prophylaxis : SCD's Start: 07/04/20 2053   Subjective: No major events overnight-denies any chest pain or shortness of breath.  Minimal right leg weakness persists.   Assessment/Plan: Acute CVA: Right leg weakness has improved-work-up as above.  Awaiting echo.  Neurology following with recommendations to continue with aspirin/Plavix for 3 weeks-followed by Plavix alone.  Evaluated by rehab team-recommendations are for SNF.  We will go ahead and consult social work.    HLD: Continue statin/fenofibrate  HTN: BP reasonable-allow permissive hypertension.  DM-2-CBGs currently stable on SSI.  Recent Labs    07/05/20 1626 07/05/20 2158 07/06/20 0732  GLUCAP 127* 123* 102*   Depression: Stable-continue Zoloft  History of dementia-with occasional delirium: Maintained on Depakote.  Asymptomatic bacteriuria: No urinary symptoms-stop Rocephin.   Diet: Diet Order            DIET DYS 3  Room service appropriate? No; Fluid consistency: Nectar Thick  Diet effective now                  Code Status: DNR  Family Communication: Petros Ahart with [phone] over phone on 5/14  Disposition Plan: Status is: Inpatient  Remains inpatient appropriate because:Inpatient level of care appropriate due to severity of illness   Dispo: The patient is from: Home              Anticipated d/c is to: Home              Patient currently is not medically stable to d/c.   Difficult to place patient No   Barriers to Discharge: Acute CVA-awaiting further work-up-will need SNF  Antimicrobial agents: Anti-infectives (From admission, onward)   Start     Dose/Rate Route Frequency Ordered Stop   07/05/20 2000  cefTRIAXone (ROCEPHIN) 1 g in sodium chloride 0.9 % 100 mL IVPB  Status:  Discontinued        1 g 200 mL/hr over 30 Minutes Intravenous Every 24 hours 07/04/20 2059 07/05/20 1047   07/04/20 1915  cefTRIAXone (ROCEPHIN) 1 g in sodium chloride 0.9 % 100 mL IVPB        1 g 200 mL/hr over 30 Minutes Intravenous  Once 07/04/20 1914 07/05/20 0749       Time spent: 25- minutes-Greater than 50% of this time was spent in counseling, explanation of diagnosis, planning of further management, and coordination of care.  MEDICATIONS: Scheduled Meds: . aspirin EC  325 mg Oral Daily  . atorvastatin  80 mg Oral Daily  . chlorhexidine  15 mL Mouth Rinse BID  . clopidogrel  75 mg Oral Daily  . divalproex  250 mg Oral Daily  . fenofibrate  160 mg Oral Daily  . insulin aspart  0-15 Units Subcutaneous TID WC  . mouth rinse  15 mL Mouth Rinse q12n4p  . sertraline  25 mg Oral QHS   Continuous Infusions:  PRN Meds:.acetaminophen **OR** acetaminophen (TYLENOL) oral liquid 160 mg/5 mL **OR** acetaminophen, LORazepam, polyvinyl alcohol, senna-docusate   PHYSICAL EXAM: Vital signs: Vitals:   07/05/20 1137 07/05/20 1949 07/06/20 0400 07/06/20 0603  BP: (!) 141/70 (!) 147/71 (!)  151/69   Pulse: 76 70 (!) 58 60  Resp: 18 17 14 15   Temp: 98.9 F (37.2 C) 98.9 F (37.2 C) 98.3 F (36.8 C)   TempSrc: Oral Oral Axillary   SpO2: 98% 96% 96% 95%  Weight:    97.4 kg  Height:       Filed Weights   07/04/20 1535 07/05/20 0447 07/06/20 0603  Weight: 90.7 kg 97 kg 97.4 kg   Body mass index is 29.95 kg/m.   Gen Exam:Alert awake-not in any distress HEENT:atraumatic, normocephalic Chest: B/L clear to auscultation anteriorly CVS:S1S2 regular Abdomen:soft non tender, non distended Extremities:no edema Neurology: Mild right leg weakness. Skin: no rash  I have personally reviewed following labs and imaging studies  LABORATORY DATA: CBC: Recent Labs  Lab 07/04/20 1729 07/04/20 1745 07/05/20 0222  WBC 8.2  --  6.8  NEUTROABS 5.3  --   --   HGB 14.4 14.3 12.8*  HCT 44.1 42.0 39.1  MCV 83.5  --  83.2  PLT 266  --  [id number]    Basic Metabolic Panel: Recent Labs  Lab 07/04/20 1729 07/04/20 1745 07/05/20 0222 07/06/20 0125  NA 140 140 138 138  K 4.0 3.9 3.4* 4.0  CL 105 104 104 106  CO2 28  --  25 27  GLUCOSE 87 83 102* 116*  BUN 16 19 16 12   CREATININE 0.92 0.90 0.83 1.06  CALCIUM 9.3  --  8.7* 8.9  MG  --   --   --  2.2    GFR: Estimated Creatinine Clearance: 69.4 mL/min (by C-G formula based on SCr of 1.06 mg/dL).  Liver Function Tests: Recent Labs  Lab 07/04/20 1729 07/05/20 0222  AST 21 16  ALT 17 15  ALKPHOS 23* 21*  BILITOT 0.5 0.5  PROT 6.3* 5.5*  ALBUMIN 3.6 3.2*   No results for input(s): LIPASE, AMYLASE in the last 168 hours. No results for input(s): AMMONIA in the last 168 hours.  Coagulation Profile: Recent Labs  Lab 07/04/20 1729  INR 1.0    Cardiac Enzymes: No results for input(s): CKTOTAL, CKMB, CKMBINDEX, TROPONINI in the last 168 hours.  BNP (last 3 results) No results for input(s): PROBNP in the last 8760 hours.  Lipid Profile: Recent Labs    07/05/20 0222  CHOL 143  HDL 32*  LDLCALC 87  TRIG 122   CHOLHDL 4.5    Thyroid Function Tests: No results for input(s): TSH, T4TOTAL, FREET4, T3FREE, THYROIDAB in the last 72 hours.  Anemia Panel: No results for input(s): VITAMINB12, FOLATE, FERRITIN, TIBC, IRON, RETICCTPCT in the last 72 hours.  Urine analysis:    Component Value Date/Time   COLORURINE YELLOW 07/04/2020 [place] 07/04/2020 1558   LABSPEC 1.016 07/04/2020 1558   PHURINE 6.0 07/04/2020 1558   [place] 07/04/2020 1558  HGBUR NEGATIVE 07/04/2020 1558   BILIRUBINUR NEGATIVE 07/04/2020 1558   KETONESUR NEGATIVE 07/04/2020 1558   PROTEINUR NEGATIVE 07/04/2020 1558   UROBILINOGEN 4.0 (H) 06/18/2009 1928   NITRITE NEGATIVE 07/04/2020 1558   LEUKOCYTESUR LARGE (A) 07/04/2020 1558    Sepsis Labs: Lactic Acid, Venous    Component Value Date/Time   LATICACIDVEN 1.7 06/18/2009 1838    MICROBIOLOGY: Recent Results (from the past 240 hour(s))  Resp Panel by RT-PCR (Flu A&B, Covid) Nasopharyngeal Swab     Status: None   Collection Time: 07/04/20  6:15 PM   Specimen: Nasopharyngeal Swab; Nasopharyngeal(NP) swabs in vial transport medium  Result Value Ref Range Status   SARS Coronavirus 2 by RT PCR NEGATIVE NEGATIVE Final    Comment: (NOTE) SARS-CoV-2 target nucleic acids are NOT DETECTED.  The SARS-CoV-2 RNA is generally detectable in upper respiratory specimens during the acute phase of infection. The lowest concentration of SARS-CoV-2 viral copies this assay can detect is 138 copies/mL. A negative result does not preclude SARS-Cov-2 infection and should not be used as the sole basis for treatment or other patient management decisions. A negative result may occur with  improper specimen collection/handling, submission of specimen other than nasopharyngeal swab, presence of viral mutation(s) within the areas targeted by this assay, and inadequate number of viral copies(<138 copies/mL). A negative result must be combined with clinical  observations, patient history, and epidemiological information. The expected result is Negative.  Fact Sheet for Patients:  EntrepreneurPulse.com.au  Fact Sheet for Healthcare Providers:  IncredibleEmployment.be  This test is no t yet approved or cleared by the [place] FDA and  has been authorized for detection and/or diagnosis of SARS-CoV-2 by FDA under an Emergency Use Authorization (EUA). This EUA will remain  in effect (meaning this test can be used) for the duration of the COVID-19 declaration under Section 564(b)(1) of the Act, 21 U.S.C.section 360bbb-3(b)(1), unless the authorization is terminated  or revoked sooner.       Influenza A by PCR NEGATIVE NEGATIVE Final   Influenza B by PCR NEGATIVE NEGATIVE Final    Comment: (NOTE) The Xpert Xpress SARS-CoV-2/FLU/RSV plus assay is intended as an aid in the diagnosis of influenza from Nasopharyngeal swab specimens and should not be used as a sole basis for treatment. Nasal washings and aspirates are unacceptable for Xpert Xpress SARS-CoV-2/FLU/RSV testing.  Fact Sheet for Patients: EntrepreneurPulse.com.au  Fact Sheet for Healthcare Providers: IncredibleEmployment.be  This test is not yet approved or cleared by the [place] FDA and has been authorized for detection and/or diagnosis of SARS-CoV-2 by FDA under an Emergency Use Authorization (EUA). This EUA will remain in effect (meaning this test can be used) for the duration of the COVID-19 declaration under Section 564(b)(1) of the Act, 21 U.S.C. section 360bbb-3(b)(1), unless the authorization is terminated or revoked.  Performed at [facility], [place] [address], [place], [place] [address]     RADIOLOGY STUDIES/RESULTS: CT HEAD WO CONTRAST  Result Date: 07/04/2020 CLINICAL DATA:  Mental status changes, generalized weakness, confusion, remote infarcts EXAM: CT HEAD WITHOUT  CONTRAST TECHNIQUE: Contiguous axial images were obtained from the base of the skull through the vertex without intravenous contrast. COMPARISON:  07/16/2019 FINDINGS: Brain: Stable atrophy pattern and diffuse bilateral white matter microvascular ischemic changes throughout both cerebral hemispheres. Mild associated ventricular enlargement. Remote bilateral basal ganglia infarcts. No new acute intracranial hemorrhage, definite new infarction, midline shift, herniation, or hydrocephalus. No extra-axial fluid collection. Cisterns are patent. Cerebellar unremarkable and stable  in appearance. Vascular: No hyperdense vessel or unexpected calcification. Skull: Normal. Negative for fracture or focal lesion. Sinuses/Orbits: No acute finding. Other: None. IMPRESSION: Stable brain atrophy pattern and chronic white matter microvascular ischemic changes. Remote basal ganglia lacunar type infarcts. No acute intracranial abnormality or interval change by noncontrast CT. Electronically Signed   By: Jerilynn Mages.  Shick M.D.   On: 07/04/2020 16:43   MR ANGIO HEAD WO CONTRAST  Result Date: 07/05/2020 CLINICAL DATA:  Stroke, follow-up. EXAM: MRA HEAD WITHOUT CONTRAST TECHNIQUE: Angiographic images of the Circle of Willis were acquired using MRA technique without intravenous contrast. COMPARISON:  Brain MRI 07/05/2020. CT angiogram head 06/24/2019. FINDINGS: The examination is limited by significant image noise artifact. Anterior circulation: The intracranial internal carotid arteries are patent. The M1 middle cerebral arteries are patent. Atherosclerotic irregularity of the M2 and more distal middle cerebral arteries bilaterally. Most notably, there are apparent progressive moderate/severe stenoses at the origins of both the superior and inferior division proximal left M2 branches (series 252, image 7). The anterior cerebral arteries are patent. Sites of progressive moderate/severe stenosis within the A2/A3 anterior cerebral arteries  bilaterally. No intracranial aneurysm is identified. Posterior circulation: The intracranial vertebral arteries and very proximal basilar artery are excluded from the field of view. The visualized basilar artery is patent without significant stenosis. 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 The posterior cerebral arteries are patent. Redemonstrated severe stenosis within the P1 left posterior cerebral artery. A sizable left posterior communicating artery is present and patent. Additional atherosclerotic irregularity of the left posterior cerebral artery. Most notably, there is a severe stenosis within a left PCA P4 branch, better appreciated on the prior CTA of 06/24/2019. Redemonstrated mild/moderate stenosis within the right PCA at the P1/P2 junction. Additional atherosclerotic irregularity of the right PCA. Most notably, a moderate/severe stenosis within a right PCA branch at the P3/P4 junction was better appreciated on the prior CTA. The right posterior communicating artery is hypoplastic or absent. Anatomic variants: As described IMPRESSION: Examination limited by image noise artifact. Additionally, the intracranial vertebral arteries and very proximal basilar artery are excluded from the field of view. Progressive moderate/severe stenoses at the origin of both the superior and inferior division left M2 MCA vessels. Progressive moderate/severe stenoses within the A2/A3 anterior cerebral arteries bilaterally. These apparent stenoses could potentially be exaggerated by image noise artifact on the current exam. Otherwise, no significant interval change as compared to the CTA head of 06/24/2019. Atherosclerotic irregularity of both posterior cerebral arteries, most notably as follows. Severe stenosis within the P1 left posterior cerebral artery (a sizable left posterior communicating artery is present and patent). Severe stenosis within a left PCA P4 branch. Mild/moderate stenosis within the right  PCA at the P1/P2 junction. Moderate/severe stenosis within a right PCA branch at the P3/P4 junction. Electronically Signed   By: Kellie Simmering DO   On: 07/05/2020 17:10   MR BRAIN WO CONTRAST  Result Date: 07/05/2020 CLINICAL DATA:  Initial evaluation for neuro deficit, stroke suspected. EXAM: MRI HEAD WITHOUT CONTRAST TECHNIQUE: Multiplanar, multiecho pulse sequences of the brain and surrounding structures were obtained without intravenous contrast. COMPARISON:  Prior CT from 07/04/2020 and MRI from 07/16/2019. FINDINGS: Brain: Diffuse prominence of the CSF containing spaces compatible with generalized age-related cerebral atrophy, advanced in nature. Extensive patchy and confluent T2/FLAIR hyperintensity within the periventricular and deep white matter both cerebral hemispheres most consistent with chronic small vessel ischemic disease, also advanced. Multiple remote lacunar infarcts present about the bilateral basal ganglia and  corona radiata. Associated chronic hemosiderin staining present at a prominent left basal ganglia lacunar infarct. 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 Small remote lacunar infarcts at the right thalamus and left pons. Appearance is relatively stable from previous. 12 mm focus of diffusion abnormality involving the left frontal corona radiata consistent with an early subacute ischemic infarct (series 5, image 80). Associated T2/FLAIR signal abnormality without significant regional mass effect. Possible minimal associated petechial hemorrhage without hemorrhagic transformation. No other evidence for acute or subacute ischemia. Gray-white matter differentiation otherwise maintained. No acute intracranial hemorrhage. No mass lesion, midline shift, or mass effect. Diffuse ventricular prominence related to global parenchymal volume loss without hydrocephalus. Mild ex vacuo dilatation of the left lateral ventricle related to chronic left  basal ganglia ischemic change. No extra-axial fluid collection. Pituitary gland suprasellar region within normal limits. Midline structures intact. Vascular: Major intracranial vascular flow voids are maintained. Skull and upper cervical spine: Craniocervical junction within normal limits. Bone marrow signal intensity normal. No scalp soft tissue abnormality. Sinuses/Orbits: Patient status post ocular lens replacement on the left. Globes and orbital soft tissues demonstrate no acute finding. Scattered mucosal thickening noted throughout the ethmoidal air cells and maxillary sinuses. Trace left mastoid effusion noted, of doubtful significance. Inner ear structures grossly normal. Other: None. IMPRESSION: 1. 12 mm early subacute ischemic infarct involving the left frontal corona radiata. Possible minimal associated petechial hemorrhage without hemorrhagic transformation or significant mass effect. 2. No other acute intracranial abnormality. 3. Advanced cerebral atrophy with chronic microvascular ischemic disease with multiple remote lacunar infarcts as above. Electronically Signed   By: Rise MuBenjamin  McClintock M.D.   On: 07/05/2020 01:25   DG Chest Portable 1 View  Result Date: 07/04/2020 CLINICAL DATA:  Foreign body MRI clearance. EXAM: PORTABLE CHEST 1 VIEW COMPARISON:  06/24/2019 FINDINGS: Overlying monitoring devices/EKG leads in place. No implanted medical device to preclude MRI imaging. Stable heart size and mediastinal contours. Mild elevation of right hemidiaphragm. No acute airspace disease. No pleural fluid or pneumothorax. No acute osseous abnormalities are seen. IMPRESSION: 1. No implanted medical device to preclude MRI imaging. 2. No acute process in the chest. Electronically Signed   By: Narda RutherfordMelanie  Sanford M.D.   On: 07/04/2020 23:16   DG Abd Portable 1V  Result Date: 07/04/2020 CLINICAL DATA:  Metal screening prior to MRI. EXAM: PORTABLE ABDOMEN - 1 VIEW COMPARISON:  None. FINDINGS: Overlying  monitoring devices. There is no implanted medical device to preclude MRI imaging. Right upper quadrant calcification, favor gallstone. Increased air throughout nondilated small bowel. Mild gaseous gastric distension. Bone island in the left iliac bone. No acute osseous abnormalities are seen. IMPRESSION: 1. No implanted medical device to preclude MRI imaging. 2. Right upper quadrant calcification, favor gallstone. 3. Increased air throughout nondilated small bowel may represent an ileus. Electronically Signed   By: Narda RutherfordMelanie  Sanford M.D.   On: 07/04/2020 23:15   VAS US CAROTID ([facility] and [facility] only)  Result Date: 07/05/2020 Carotid Arterial Duplex Study Patient Name:  Cathren LaineFLOYD L Moxley  Date of Exam:   07/05/2020 Medical Rec #: [id number]      Accession #:    [id number] Date of Birth: 01-07-1941     Patient Gender: M Patient Age:   79Y [facility] Procedure:      VAS US CAROTID Referring Phys: [id number] Kelle DartingBRYAN W HOWINGTON --------------------------------------------------------------------------------  Indications:       CVA, Speech disturbance and Weakness. Risk Factors:  Hypertension, hyperlipidemia, Diabetes. Other Factors:     Vascular dementia. Comparison Study:  Prior carotid duplex indicating 1-39% ICA stenosis, done                    06/25/19 Performing Technologist: Sharion Dove RVS  Examination Guidelines: A complete evaluation includes B-mode imaging, spectral Doppler, color Doppler, and power Doppler as needed of all accessible portions of each vessel. Bilateral testing is considered an integral part of a complete examination. Limited examinations for reoccurring indications may be performed as noted.  Right Carotid Findings: +----------+--------+--------+--------+----------------------+--------+           PSV cm/sEDV cm/sStenosisPlaque Description    Comments +----------+--------+--------+--------+----------------------+--------+ CCA Prox  67      7                                               +----------+--------+--------+--------+----------------------+--------+ CCA Distal52      11                                             +----------+--------+--------+--------+----------------------+--------+ ICA Prox  57      15              irregular and calcific         +----------+--------+--------+--------+----------------------+--------+ ICA Mid   45      13                                             +----------+--------+--------+--------+----------------------+--------+ ICA Distal78      19                                             +----------+--------+--------+--------+----------------------+--------+ ECA       125     15                                             +----------+--------+--------+--------+----------------------+--------+ +----------+--------+-------+--------+-------------------+           PSV cm/sEDV cmsDescribeArm Pressure (mmHG) +----------+--------+-------+--------+-------------------+ [id number]                                         +----------+--------+-------+--------+-------------------+ +---------+--------+--+--------+-+ VertebralPSV cm/s42EDV cm/s5 +---------+--------+--+--------+-+  Left Carotid Findings: +----------+--------+--------+--------+------------------+------------------+           PSV cm/sEDV cm/sStenosisPlaque DescriptionComments           +----------+--------+--------+--------+------------------+------------------+ CCA Prox  125     12                                                   +----------+--------+--------+--------+------------------+------------------+ CCA Distal72      12  intimal thickening +----------+--------+--------+--------+------------------+------------------+ ICA Prox  64      13              calcific                             +----------+--------+--------+--------+------------------+------------------+ ICA  Distal65      18                                                   +----------+--------+--------+--------+------------------+------------------+ ECA       120     14                                                   +----------+--------+--------+--------+------------------+------------------+ +----------+--------+--------+--------+-------------------+           PSV cm/sEDV cm/sDescribeArm Pressure (mmHG) +----------+--------+--------+--------+-------------------+ [id number]      4                                   +----------+--------+--------+--------+-------------------+ +---------+--------+--+--------+-+ VertebralPSV cm/s26EDV cm/s4 +---------+--------+--+--------+-+   Summary: Right Carotid: Velocities in the right ICA are consistent with a 1-39% stenosis. Left Carotid: Velocities in the left ICA are consistent with a 1-39% stenosis. Vertebrals:  Bilateral vertebral arteries demonstrate antegrade flow. Subclavians: Normal flow hemodynamics were seen in bilateral subclavian              arteries. *See table(s) above for measurements and observations.     Preliminary      LOS: 2 days   Oren Binet, MD  [facility]    To contact the attending provider between 7A-7P or the covering provider during after hours 7P-7A, please log into the web site [email or web address] and access using universal [place] password for that web site. If you do not have the password, please call the hospital operator.  07/06/2020, 10:54 AM

## 2020-07-07 ENCOUNTER — Inpatient Hospital Stay (HOSPITAL_COMMUNITY): Payer: No Typology Code available for payment source

## 2020-07-07 LAB — GLUCOSE, CAPILLARY
Glucose-Capillary: 148 mg/dL — ABNORMAL HIGH (ref 70–99)
Glucose-Capillary: 90 mg/dL (ref 70–99)
Glucose-Capillary: 95 mg/dL (ref 70–99)
Glucose-Capillary: 117 mg/dL — ABNORMAL HIGH (ref 70–99)
Glucose-Capillary: 151 mg/dL — ABNORMAL HIGH (ref 70–99)

## 2020-07-07 MED ORDER — NAPHAZOLINE-GLYCERIN 0.012-0.25 % OP SOLN
1.0000 [drp] | Freq: Four times a day (QID) | OPHTHALMIC | Status: DC | PRN
  Filled 2020-07-07: qty 15

## 2020-07-07 NOTE — TOC Progression Note (Addendum)
Transition of Care [facility]) - Progression Note    Patient Details  Name: Justin [name] MRN: [id number] Date of Birth: 17-May-1940  [facility] CM/SW Contact  Sharlet Salina Mila Homer, LCSW Phone Number: 07/07/2020, 6:08 PM  Clinical Narrative:  Talked with wife at the bedside regarding SNF placement. She was again given facility responses and Mrs. Cardin asked about a Veterans facility in [place] and her husband going to this facility. CSW advised wife that this would be looked into. CSW explained that patient's VA benefits may be used to pay for rehab and briefly explained the process of working with the [facility] to place patients into skilled nursing facilities. Mrs. Germano reported that her husband has been to CIR before but has never been to a skilled nursing facility. She indicated that she cannot take her husband home at discharge as he can't walk right now and added that he also has swallowing deficits.     Expected Discharge Plan: [place] Barriers to Discharge: Continued Medical Work up,SNF Pending bed offer  Expected Discharge Plan and Services Expected Discharge Plan: [place] Choice: [name] arrangements for the past 2 months: Single Family Home                                     Social Determinants of Health (SDOH) Interventions  None needed or requested at this time.  Readmission Risk Interventions No flowsheet data found.

## 2020-07-07 NOTE — Progress Notes (Signed)
  Speech Language Pathology Treatment: Dysphagia;Cognitive-Linquistic  Patient Details Name: Justin [name] MRN: [id number] DOB: 02-24-1940 Today's Date: 07/07/2020 Time: 1245-8099 SLP Time Calculation (min) (ACUTE ONLY): 19 min  Assessment / Plan / Recommendation Clinical Impression  Pt was seen for treatment with his wife present. Pt and his wife were educated regarding the results of the modified barium swallow study, diet recommendations, and swallowing precautions. Video recording of the study was used to facilitate education and both parties verbalized understanding regarding all areas of education. Reinforcement will be needed due to baseline cognitive deficits, but pt was able to demonstrate the chin tuck posture with cues. Pt and his wife were educated regarding the nature of dysarthria, and compensatory strategies to improve speech intelligibility. Both parties verbalized understanding regarding all areas of education and pt used compensatory strategies at the word level with 60% accuracy increasing to 100% accuracy with cues for overarticulation and vocal intensity. SLP will continue to follow pt.   HPI HPI: Pt is a 80 y.o. male who presented with worsening right leg weakness.  MRI brain 5/14: 12 mm early subacute ischemic infarct left frontal  corona radiata. Possible minimal associated petechial hemorrhage. 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 CXR 5/13: no acute process in the chest.  BSE 07/17/19 no s/s, 3 oz without difficulty- reg/thin recommended. Outpatient SLP services September-October 2021: overall mild-moderate deficits; SLP felt level of deficit is functionally moderate-severe with sustained/selective attention, memory, awareness, and language impairments. It was judged that the pt would be unlikely to make progress/achieve carryover without family involvement due to severity of deficits.  However, family participation was inadequate despite SLP's efforts.      SLP Plan  MBS;Continue with current plan of care  Patient needs continued Speech Lanaguage Pathology Services    Recommendations  Diet recommendations: Regular;Thin liquid Liquids provided via: Cup;No straw Medication Administration: Crushed with puree Supervision: Patient able to self feed Compensations: Slow rate;Small sips/bites;Lingual sweep for clearance of pocketing;Chin tuck Postural Changes and/or Swallow Maneuvers: Seated upright 90 degrees;Chin tuck                Oral Care Recommendations: Oral care BID Follow up Recommendations: Skilled Nursing facility SLP Visit Diagnosis: Dysphagia, unspecified (R13.10) Plan: MBS;Continue with current plan of care       Kandy Towery I. Hardin Negus, [place], [place] Office number [phone] Pager [phone]                Horton Marshall 07/07/2020, 4:09 PM

## 2020-07-07 NOTE — Progress Notes (Signed)
Physical Therapy Treatment Patient Details Name: Justin [name] MRN: [id number] DOB: [birth year] Today's Date: 07/07/2020    History of Present Illness 80 y.o. male with medical history significant of prior CVA baseline left upper extremity lower extremity weakness right lower extremity weakness, hypertension, hyperlipidemia presents with worsening dysarthria and right lower leg weakness.  MRI suggests:early subacute ischemic infarct involving the left frontal  corona radiata.  Patient with UTI as well.    PT Comments    Patient received in bed eating lunch, but with poor positioning (somewhat reclined and trying to eat this way). Agreeable to EOB and standing activities today-but still needed [place] for virtually all aspects of functional mobility. Continues to have strong posterior lean as well as difficulty with general sequencing, also still very difficult for him to advance his R LE. Would probably benefit from chair follow for gait progression. Left fully upright in bed in chair position, bed alarm active and all needs met. Will continue to follow.    Follow Up Recommendations  SNF;Supervision/Assistance - 24 hour     Equipment Recommendations  Rolling walker with 5" wheels;3in1 (PT);Wheelchair (measurements PT);Wheelchair cushion (measurements PT)    Recommendations for Other Services       Precautions / Restrictions Precautions Precautions: Fall Restrictions Weight Bearing Restrictions: No    Mobility  Bed Mobility Overal bed mobility: Needs Assistance Bed Mobility: Supine to Sit;Sit to Supine     Supine to sit: Mod assist;HOB elevated Sit to supine: Mod assist   General bed mobility comments: ModA to manage BLEs and bring trunk to upright, strong posterior lean today; going back to bed, able to manage trunk (for the most part) but needed more help with BLEs    Transfers Overall transfer level: Needs assistance Equipment used: 1 person hand held assist Transfers: Sit  to/from Stand Sit to Stand: Mod assist         General transfer comment: performed multiple sit to stands during session with face to face technique; attempted side steps but unable to initiate and therapist did not have a free limb to assist RLE with the sts technique we used today, so just kept standing/sititng his hips down to the right and moving his legs until he was where he needed to be at EOB  Ambulation/Gait             General Gait Details: deferred- pt in middle of lunch/only agreeable to bedside activities    Stairs             Wheelchair Mobility    Modified Rankin (Stroke Patients Only)       Balance Overall balance assessment: Needs assistance Sitting-balance support: Feet supported Sitting balance-Leahy Scale: Poor Sitting balance - Comments: strong posterior lean, cues to use rails for support Postural control: Posterior lean Standing balance support: Bilateral upper extremity supported;During functional activity Standing balance-Leahy Scale: Poor Standing balance comment: posterior lean in standing as well                            Cognition Arousal/Alertness: Awake/alert Behavior During Therapy: WFL for tasks assessed/performed Overall Cognitive Status: History of cognitive impairments - at baseline                                 General Comments: seems to have some expressive difficulties- a bit hard to understand at times. Slow processing and  needs simple, step by step cues      Exercises      General Comments        Pertinent Vitals/Pain Pain Assessment: Faces Faces Pain Scale: No hurt Pain Intervention(s): Limited activity within patient's tolerance;Monitored during session    Home Living                      Prior Function            PT Goals (current goals can now be found in the care plan section) Acute Rehab PT Goals Patient Stated Goal: no goals stated PT Goal Formulation: Patient  unable to participate in goal setting Time For Goal Achievement: 07/19/20 Progress towards PT goals: Progressing toward goals    Frequency    Min 3X/week      PT Plan Current plan remains appropriate;Equipment recommendations need to be updated    Co-evaluation              AM-PAC PT "6 Clicks" Mobility   Outcome Measure  Help needed turning from your back to your side while in a flat bed without using bedrails?: A Lot Help needed moving from lying on your back to sitting on the side of a flat bed without using bedrails?: A Lot Help needed moving to and from a bed to a chair (including a wheelchair)?: A Lot Help needed standing up from a chair using your arms (e.g., wheelchair or bedside chair)?: A Lot Help needed to walk in hospital room?: A Lot Help needed climbing 3-5 steps with a railing? : Total 6 Click Score: 11    End of Session Equipment Utilized During Treatment: Gait belt Activity Tolerance: Patient tolerated treatment well Patient left: in bed;with call bell/phone within reach;with bed alarm set (in chair position) Nurse Communication: Mobility status PT Visit Diagnosis: Unsteadiness on feet (R26.81);Other abnormalities of gait and mobility (R26.89);Difficulty in walking, not elsewhere classified (R26.2)     Time: 4628-6381 PT Time Calculation (min) (ACUTE ONLY): 13 min  Charges:  $Therapeutic Activity: 8-22 mins                     Windell Norfolk, DPT, PN1   Supplemental Physical Therapist [place]    Pager [phone] Acute Rehab Office [phone]

## 2020-07-07 NOTE — Evaluation (Signed)
Speech Language Pathology Evaluation Patient Details Name: Justin [name] MRN: [id number] DOB: [birth year] Today's Date: 07/07/2020 Time: 4193-7902 SLP Time Calculation (min) (ACUTE ONLY): 18 min  Problem List:  Patient Active Problem List   Diagnosis Date Noted  . UTI (urinary tract infection) 07/04/2020  . Right leg weakness 07/04/2020  . Bacteria in urine 07/04/2020  . Labile blood glucose   . 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 OSA (obstructive sleep apnea)   . Controlled type 2 diabetes mellitus with hyperglycemia, without long-term current use of insulin ([place])   . CVA (cerebral vascular accident) ([place]) 07/16/2019  . Dyslipidemia   . Benign essential HTN   . Diabetes mellitus type 2 in obese ([place])   . Major depressive disorder, recurrent episode, moderate ([place])   . Gait disorder   . Acute right MCA stroke ([place]) 06/28/2019  . Ambulatory dysfunction   . Stroke ([place]) 06/24/2019  . Type 2 diabetes mellitus ([place]) 10/04/2018  . Depression 10/04/2018  . Acute CVA (cerebrovascular accident) ([place]) 09/18/2017  . Stroke (cerebrum) ([place]) 09/17/2017  . Diabetes mellitus without complication ([place]) 40/97/3532  . Closed head injury 04/22/2013  . HLD (hyperlipidemia) 07/29/2009  . Essential hypertension 07/29/2009  . EFFUSION, PLEURAL 07/29/2009  . DIVERTICULITIS, COLON 07/29/2009  . 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 COLONIC POLYPS, HYPERPLASTIC, HX OF 07/29/2009   Past Medical History:  Past Medical History:  Diagnosis Date  . Brain bleed ([place])   . Cancer  [facility])    prostate  . 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 Depression   . Diabetes mellitus without complication ([place])   . High cholesterol   . Hypertension   . Stroke [facility])    Past Surgical History:  Past Surgical History:  Procedure Laterality Date  . arm & hand surgery Left 1972   skill saw accident  . COLON SURGERY     partial  . EYE SURGERY     lens implant and cateract- Left  .  PROSTATECTOMY     HPI:  Pt is a 80 y.o. male who presented with worsening right leg weakness.  MRI brain 5/14: 12 mm early subacute ischemic infarct left frontal  corona radiata. Possible minimal associated petechial hemorrhage. 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 CXR 5/13: no acute process in the chest.  BSE 07/17/19 no s/s, 3 oz without difficulty- reg/thin recommended. Outpatient SLP services September-October 2021: overall mild-moderate deficits; SLP felt level of deficit is functionally moderate-severe with sustained/selective attention, memory, awareness, and language impairments. It was judged that the pt would be unlikely to make progress/achieve carryover without family involvement due to severity of deficits. However, family participation was inadequate despite SLP's efforts.   Assessment / Plan / Recommendation Clinical Impression  Pt participated in speech/language evaluation with his wife present. [name] reported the pt having baseline deficits in cognition and these have been well documented during his receipt of outpatient SLP services. [name] reported that his speech is now "slurred" and that he has been having acute difficulty with word retrieval. [name] stated that his speech is less than 50% back to baseline and that she cannot understand most of what he says. 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 Perseveration and paraphasias were intermittently noted and pt's overall processing  speed was reduced. Pt demonstrated cognitive-linguistic impairments in  attention, memory, and problem solving and executive function. He exhibited difficulty with more complex receptive language tasks and the impact of his cognitive-linguistic  impairments on this is considered. Skilled SLP services will be initiated at this time to target dysarthria and to further assess expressive language, but it is anticipated that his baseline cognitive-linguistic impairments will negatively impact progress.    SLP Assessment  SLP Recommendation/Assessment: Patient needs continued Speech Lanaguage Pathology Services SLP Visit Diagnosis: Dysphagia, unspecified (R13.10)    Follow Up Recommendations  Skilled Nursing facility    Frequency and Duration min 2x/week  2 weeks      SLP Evaluation Cognition  Overall Cognitive Status: History of cognitive impairments - at baseline Orientation Level: Oriented to person;Disoriented to place;Disoriented to time;Disoriented to situation Attention: Focused;Sustained Focused Attention: Impaired Focused Attention Impairment: Verbal complex Sustained Attention: Impaired Sustained Attention Impairment: Verbal complex       Comprehension  Auditory Comprehension Overall Auditory Comprehension: Impaired at baseline Yes/No Questions: Impaired Basic Immediate Environment Questions:  (5/5) Complex Questions:  (4/5) Commands: Impaired Two Step Basic Commands:  (3/4) Multistep Basic Commands:  (0/4) Interfering Components: Attention;Processing speed;Working memory    Expression Expression Primary Mode of Expression: Verbal Verbal Expression Initiation: Impaired Automatic Speech: Counting;Day of week;Month of year (Counting: 10/10; days: 7/7 months: 8/12) Level of Generative/Spontaneous Verbalization: Sentence Repetition: Impaired Level of Impairment: Sentence level (3/5) Naming: Impairment Responsive:  (Responsive: 3/5) Confrontation:  (8/10) Convergent:  (Sentence completion: 3/5) Verbal Errors: Perseveration Pragmatics: Impairment Impairments: Abnormal affect;Eye contact Interfering Components: Attention   Oral / Motor  Oral Motor/Sensory Function Overall Oral Motor/Sensory Function: Within  functional limits Motor Speech Overall Motor Speech: Impaired Phonation: Low vocal intensity Resonance: Within functional limits Articulation: Impaired Level of Impairment: Conversation Intelligibility: Intelligibility reduced Word: 75-100% accurate Phrase: 50-74% accurate Sentence: 50-74% accurate Conversation: 25-49% accurate Motor Planning: Witnin functional limits Motor Speech Errors: Aware;Consistent   Meggan Dhaliwal I. Hardin Negus, [place], [place] Office number [phone] Pager [phone]                    Horton Marshall 07/07/2020, 4:03 PM

## 2020-07-07 NOTE — Progress Notes (Signed)
Modified Barium Swallow Progress Note  Patient Details  Name: Justin [name] MRN: [id number] Date of Birth: 02-26-1940  Today's Date: 07/07/2020  Modified Barium Swallow completed.  Full report located under Chart Review in the Imaging Section.  Brief recommendations include the following:  Clinical Impression  Pt presents with pharyngeal dysphagia characterized by reduced anterior laryngeal movement and a pharyngeal delay. He demonstrated penetration (PAS 3, 5) and aspiration (PAS 7, 8) with thin liquids via cup and straw and with nectar thick liquids. With thin liquids via cup, aspiration of penetrated material was trace and occurred after deglutition. Aspiration was most significant with thin liquids via straw. Prompted coughing was effective with trace aspiration, but not with larger quantities via straw. Coughing was ineffective in expelling nectar thick penetrate. Penetration was improved to PAS 2 and aspiration eliminated with individual sips of thin liquids via cup when a chin tuck posture was used. A regular texture diet with thin liquids via cup is recommended at this time with strict observance of swallowing precautions and with full supervision to ensure consistent use of chin tuck posture. SLP will continue to follow pt.    Swallow Evaluation Recommendations       SLP Diet Recommendations: Regular solids;Thin liquid   Liquid Administration via: Cup;No straw   Medication Administration: Whole meds with puree   Supervision: Full supervision/cueing for compensatory strategies;Staff to assist with self feeding   Compensations: Slow rate;Small sips/bites;Chin tuck (cough intermittently)   Postural Changes: Seated upright at 90 degrees         Jac Romulus I. Hardin Negus, [place], [place] Office number [phone] Pager [phone]   Horton Marshall 07/07/2020,2:45 PM

## 2020-07-07 NOTE — Progress Notes (Signed)
PROGRESS NOTE        PATIENT DETAILS Name: Justin [name] Age: 80 y.o. Sex: male Date of Birth: Jul 23, 1940 Admit Date: 07/04/2020 Admitting Physician Jacqlyn Krauss, MD FUX:NATFTD, Rebeca Alert, MD  Brief Narrative: Patient is a 80 y.o. male with history of prior CVA, HTN, HLD, DM-2-who presented with worsening right leg weakness-further evaluation revealed acute CVA.  Significant events: 5/13>> admitted with right leg weakness-found to have acute CVA.  Significant studies: 06/25/2019>> A1c 7.2 5/13>> chest x-ray: No pneumonia. 5/13>> CT head: No acute intracranial abnormality. 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 5/14>> LDL: 87 5/15>> A1c: 6.8 5/15>> Echo: EF 32-20%, grade 1 diastolic dysfunction.   Antimicrobial therapy: None  Microbiology data: 5/13>> COVID/influenza PCR: Negative  Procedures : None  Consults: Neurology  DVT Prophylaxis : SCD's Start: 07/04/20 2053   Subjective: No major issues overnight-no chest pain or shortness of breath.   Assessment/Plan: Acute CVA: Significant improvement in right leg weakness-work-up as above-stroke MD recommending aspirin/Plavix for 3 weeks followed by Plavix alone.  Awaiting SNF bed.     HLD: Continue statin/fenofibrate  HTN: BP reasonable-allow permissive hypertension.  DM-2-CBGs currently stable on SSI.  Recent Labs    07/06/20 1632 07/06/20 2003 07/07/20 0720  GLUCAP 116* 148* 90   Depression: Stable-continue Zoloft  History of dementia-with occasional delirium: Maintained on Depakote.  Asymptomatic bacteriuria: No urinary symptoms-stop Rocephin.   Diet: Diet Order            DIET DYS 3 Room service appropriate? No; Fluid consistency:  Nectar Thick  Diet effective now                  Code Status: DNR  Family Communication: Kagan Mutchler with [phone] -at bedside on 5/16  Disposition Plan: Status is: Inpatient  Remains inpatient appropriate because:Inpatient level of care appropriate due to severity of illness   Dispo: The patient is from: Home              Anticipated d/c is to: Home              Patient currently stable for discharge   Difficult to place patient No   Barriers to Discharge: Awaiting SNF bed.  Antimicrobial agents: Anti-infectives (From admission, onward)   Start     Dose/Rate Route Frequency Ordered Stop   07/05/20 2000  cefTRIAXone (ROCEPHIN) 1 g in sodium chloride 0.9 % 100 mL IVPB  Status:  Discontinued        1 g 200 mL/hr over 30 Minutes Intravenous Every 24 hours 07/04/20 2059 07/05/20 1047   07/04/20 1915  cefTRIAXone (ROCEPHIN) 1 g in sodium chloride 0.9 % 100 mL IVPB        1 g 200 mL/hr over 30 Minutes Intravenous  Once 07/04/20 1914 07/05/20 0749       Time spent: 25- minutes-Greater than 50% of this time was spent in counseling, explanation of diagnosis, planning of further management, and coordination of care.  MEDICATIONS: Scheduled Meds: . aspirin EC  325 mg Oral Daily  . atorvastatin  80 mg Oral Daily  . chlorhexidine  15 mL Mouth Rinse BID  . clopidogrel  75 mg Oral Daily  . divalproex  250 mg Oral Daily  . fenofibrate  160 mg Oral Daily  . insulin aspart  0-15 Units Subcutaneous TID WC  . mouth rinse  15 mL Mouth Rinse q12n4p  . sertraline  25 mg Oral QHS   Continuous Infusions:  PRN Meds:.acetaminophen **OR** acetaminophen (TYLENOL) oral liquid 160 mg/5 mL **OR** acetaminophen, LORazepam, polyvinyl alcohol, senna-docusate   PHYSICAL EXAM: Vital signs: Vitals:   07/06/20 0603 07/06/20 1222 07/06/20 1921 07/07/20 0400  BP:  (!) 146/120 136/86 130/80  Pulse: 60 64 64 (!) 58  Resp: 15 17 18 15   Temp:  98.2 F (36.8 C) 98.4 F (36.9 C) 98.2  F (36.8 C)  TempSrc:  Axillary Oral Axillary  SpO2: 95% 95% 96% 95%  Weight: 97.4 kg     Height:       Filed Weights   07/04/20 1535 07/05/20 0447 07/06/20 0603  Weight: 90.7 kg 97 kg 97.4 kg   Body mass index is 29.95 kg/m.   Gen Exam:Alert awake-not in any distress HEENT:atraumatic, normocephalic Chest: B/L clear to auscultation anteriorly CVS:S1S2 regular Abdomen:soft non tender, non distended Extremities:no edema Neurology: Mild right-sided weakness. Skin: no rash  I have personally reviewed following labs and imaging studies  LABORATORY DATA: CBC: Recent Labs  Lab 07/04/20 1729 07/04/20 1745 07/05/20 0222  WBC 8.2  --  6.8  NEUTROABS 5.3  --   --   HGB 14.4 14.3 12.8*  HCT 44.1 42.0 39.1  MCV 83.5  --  83.2  PLT 266  --  [id number]    Basic Metabolic Panel: Recent Labs  Lab 07/04/20 1729 07/04/20 1745 07/05/20 0222 07/06/20 0125  NA 140 140 138 138  K 4.0 3.9 3.4* 4.0  CL 105 104 104 106  CO2 28  --  25 27  GLUCOSE 87 83 102* 116*  BUN 16 19 16 12   CREATININE 0.92 0.90 0.83 1.06  CALCIUM 9.3  --  8.7* 8.9  MG  --   --   --  2.2    GFR: Estimated Creatinine Clearance: 69.4 mL/min (by C-G formula based on SCr of 1.06 mg/dL).  Liver Function Tests: Recent Labs  Lab 07/04/20 1729 07/05/20 0222  AST 21 16  ALT 17 15  ALKPHOS 23* 21*  BILITOT 0.5 0.5  PROT 6.3* 5.5*  ALBUMIN 3.6 3.2*   No results for input(s): LIPASE, AMYLASE in the last 168 hours. No results for input(s): AMMONIA in the last 168 hours.  Coagulation Profile: Recent Labs  Lab 07/04/20 1729  INR 1.0    Cardiac Enzymes: No results for input(s): CKTOTAL, CKMB, CKMBINDEX, TROPONINI in the last 168 hours.  BNP (last 3 results) No results for input(s): PROBNP in the last 8760 hours.  Lipid Profile: Recent Labs    07/05/20 0222  CHOL 143  HDL 32*  LDLCALC 87  TRIG 122  CHOLHDL 4.5    Thyroid Function Tests: No results for input(s): TSH, T4TOTAL, FREET4, T3FREE,  THYROIDAB in the last 72 hours.  Anemia Panel: No results for input(s): VITAMINB12, FOLATE, FERRITIN, TIBC, IRON, RETICCTPCT in the last 72 hours.  Urine analysis:    Component Value Date/Time   COLORURINE YELLOW 07/04/2020 [place] 07/04/2020 1558   LABSPEC 1.016 07/04/2020 1558   PHURINE 6.0 07/04/2020 1558   GLUCOSEU NEGATIVE 07/04/2020 1558   HGBUR NEGATIVE 07/04/2020 [place] 07/04/2020 [place] 07/04/2020 1558   PROTEINUR NEGATIVE  07/04/2020 1558   UROBILINOGEN 4.0 (H) 06/18/2009 1928   NITRITE NEGATIVE 07/04/2020 1558   LEUKOCYTESUR LARGE (A) 07/04/2020 1558    Sepsis Labs: Lactic Acid, Venous    Component Value Date/Time   LATICACIDVEN 1.7 06/18/2009 1838    MICROBIOLOGY: Recent Results (from the past 240 hour(s))  Resp Panel by RT-PCR (Flu A&B, Covid) Nasopharyngeal Swab     Status: None   Collection Time: 07/04/20  6:15 PM   Specimen: Nasopharyngeal Swab; Nasopharyngeal(NP) swabs in vial transport medium  Result Value Ref Range Status   SARS Coronavirus 2 by RT PCR NEGATIVE NEGATIVE Final    Comment: (NOTE) SARS-CoV-2 target nucleic acids are NOT DETECTED.  The SARS-CoV-2 RNA is generally detectable in upper respiratory specimens during the acute phase of infection. The lowest concentration of SARS-CoV-2 viral copies this assay can detect is 138 copies/mL. A negative result does not preclude SARS-Cov-2 infection and should not be used as the sole basis for treatment or other patient management decisions. A negative result may occur with  improper specimen collection/handling, submission of specimen other than nasopharyngeal swab, presence of viral mutation(s) within the areas targeted by this assay, and inadequate number of viral copies(<138 copies/mL). A negative result must be combined with clinical observations, patient history, and epidemiological information. The expected result is Negative.  Fact  Sheet for Patients:  EntrepreneurPulse.com.au  Fact Sheet for Healthcare Providers:  IncredibleEmployment.be  This test is no t yet approved or cleared by the [place] FDA and  has been authorized for detection and/or diagnosis of SARS-CoV-2 by FDA under an Emergency Use Authorization (EUA). This EUA will remain  in effect (meaning this test can be used) for the duration of the COVID-19 declaration under Section 564(b)(1) of the Act, 21 U.S.C.section 360bbb-3(b)(1), unless the authorization is terminated  or revoked sooner.       Influenza A by PCR NEGATIVE NEGATIVE Final   Influenza B by PCR NEGATIVE NEGATIVE Final    Comment: (NOTE) The Xpert Xpress SARS-CoV-2/FLU/RSV plus assay is intended as an aid in the diagnosis of influenza from Nasopharyngeal swab specimens and should not be used as a sole basis for treatment. Nasal washings and aspirates are unacceptable for Xpert Xpress SARS-CoV-2/FLU/RSV testing.  Fact Sheet for Patients: EntrepreneurPulse.com.au  Fact Sheet for Healthcare Providers: IncredibleEmployment.be  This test is not yet approved or cleared by the [place] FDA and has been authorized for detection and/or diagnosis of SARS-CoV-2 by FDA under an Emergency Use Authorization (EUA). This EUA will remain in effect (meaning this test can be used) for the duration of the COVID-19 declaration under Section 564(b)(1) of the Act, 21 U.S.C. section 360bbb-3(b)(1), unless the authorization is terminated or revoked.  Performed at [facility], [place] [address] [place], [place] [address]     RADIOLOGY STUDIES/RESULTS: MR ANGIO HEAD WO CONTRAST  Result Date: 07/05/2020 CLINICAL DATA:  Stroke, follow-up. EXAM: MRA HEAD WITHOUT CONTRAST TECHNIQUE: Angiographic images of the Circle of Willis were acquired using MRA technique without intravenous contrast. COMPARISON:  Brain MRI  07/05/2020. CT angiogram head 06/24/2019. FINDINGS: The examination is limited by significant image noise artifact. Anterior circulation: The intracranial internal carotid arteries are patent. The M1 middle cerebral arteries are patent. Atherosclerotic irregularity of the M2 and more distal middle cerebral arteries bilaterally. Most notably, there are apparent progressive moderate/severe stenoses at the origins of both the superior and inferior division proximal left M2 branches (series 252, image 7). The anterior cerebral arteries are patent. Sites  of progressive moderate/severe stenosis within the A2/A3 anterior cerebral arteries bilaterally. No intracranial aneurysm is identified. Posterior circulation: The intracranial vertebral arteries and very proximal basilar artery are excluded from the field of view. The visualized basilar artery is patent without significant stenosis. 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 The posterior cerebral arteries are patent. Redemonstrated severe stenosis within the P1 left posterior cerebral artery. A sizable left posterior communicating artery is present and patent. Additional atherosclerotic irregularity of the left posterior cerebral artery. Most notably, there is a severe stenosis within a left PCA P4 branch, better appreciated on the prior CTA of 06/24/2019. Redemonstrated mild/moderate stenosis within the right PCA at the P1/P2 junction. Additional atherosclerotic irregularity of the right PCA. Most notably, a moderate/severe stenosis within a right PCA branch at the P3/P4 junction was better appreciated on the prior CTA. The right posterior communicating artery is hypoplastic or absent. Anatomic variants: As described IMPRESSION: Examination limited by image noise artifact. Additionally, the intracranial vertebral arteries and very proximal basilar artery are excluded from the field of view. Progressive moderate/severe stenoses at the origin of both the  superior and inferior division left M2 MCA vessels. Progressive moderate/severe stenoses within the A2/A3 anterior cerebral arteries bilaterally. These apparent stenoses could potentially be exaggerated by image noise artifact on the current exam. Otherwise, no significant interval change as compared to the CTA head of 06/24/2019. Atherosclerotic irregularity of both posterior cerebral arteries, most notably as follows. Severe stenosis within the P1 left posterior cerebral artery (a sizable left posterior communicating artery is present and patent). Severe stenosis within a left PCA P4 branch. Mild/moderate stenosis within the right PCA at the P1/P2 junction. Moderate/severe stenosis within a right PCA branch at the P3/P4 junction. Electronically Signed   By: Kellie Simmering DO   On: 07/05/2020 17:10   ECHOCARDIOGRAM COMPLETE  Result Date: 07/06/2020    ECHOCARDIOGRAM REPORT   Patient Name:   Justin [name] Date of Exam: 07/06/2020 Medical Rec #:  [id number]     Height:       71.0 in Accession #:    [id number]    Weight:       214.7 lb Date of Birth:  1940-08-10    BSA:          2.173 m Patient Age:    43 years      BP:           151/69 mmHg Patient Gender: M             HR:           63 bpm. Exam Location:  Inpatient Procedure: 2D Echo, Cardiac Doppler and Color Doppler Indications:    CHF-Acute Diastolic W38.88  History:        Patient has prior history of Echocardiogram examinations, most                 recent 06/25/2019.  Sonographer:    Merrie Roof Referring Phys: [id number] [place]  1. Left ventricular ejection fraction, by estimation, is 60 to 65%. The left ventricle has normal function. The left ventricle has no regional wall motion abnormalities. There is mild asymmetric left ventricular hypertrophy of the septal segment. Left ventricular diastolic parameters are consistent with Grade I diastolic dysfunction (impaired relaxation).  2. Right ventricular systolic function is normal. The right  ventricular size is mildly enlarged. Tricuspid regurgitation signal is inadequate for assessing PA pressure.  3. The mitral valve is normal in structure. No  evidence of mitral valve regurgitation. No evidence of mitral stenosis.  4. The aortic valve is tricuspid. There is moderate calcification of the aortic valve. There is moderate thickening of the aortic valve. Aortic valve regurgitation is trivial. Mild to moderate aortic valve sclerosis/calcification is present, without any  evidence of aortic stenosis. Aortic valve area, by VTI measures 2.73 cm. Aortic valve mean gradient measures 4.0 mmHg. Aortic valve Vmax measures 1.46 m/s. FINDINGS  Left Ventricle: Left ventricular ejection fraction, by estimation, is 60 to 65%. The left ventricle has normal function. The left ventricle has no regional wall motion abnormalities. The left ventricular internal cavity size was normal in size. There is  mild asymmetric left ventricular hypertrophy of the septal segment. Left ventricular diastolic parameters are consistent with Grade I diastolic dysfunction (impaired relaxation). Normal left ventricular filling pressure. Right Ventricle: The right ventricular size is mildly enlarged. No increase in right ventricular wall thickness. Right ventricular systolic function is normal. Tricuspid regurgitation signal is inadequate for assessing PA pressure. Left Atrium: Left atrial size was normal in size. Right Atrium: Right atrial size was normal in size. Pericardium: There is no evidence of pericardial effusion. Mitral Valve: The mitral valve is normal in structure. Mild mitral annular calcification. No evidence of mitral valve regurgitation. No evidence of mitral valve stenosis. Tricuspid Valve: The tricuspid valve is normal in structure. Tricuspid valve regurgitation is not demonstrated. No evidence of tricuspid stenosis. Aortic Valve: The aortic valve is tricuspid. There is moderate calcification of the aortic valve. There is  moderate thickening of the aortic valve. Aortic valve regurgitation is trivial. Mild to moderate aortic valve sclerosis/calcification is present, without any evidence of aortic stenosis. Aortic valve mean gradient measures 4.0 mmHg. Aortic valve peak gradient measures 8.5 mmHg. Aortic valve area, by VTI measures 2.73 cm. Pulmonic Valve: The pulmonic valve was normal in structure. Pulmonic valve regurgitation is not visualized. No evidence of pulmonic stenosis. Aorta: The aortic root is normal in size and structure. IAS/Shunts: No atrial level shunt detected by color flow Doppler.  LEFT VENTRICLE PLAX 2D LVIDd:         4.00 cm  Diastology LVIDs:         2.40 cm  LV e' medial:    7.72 cm/s LV PW:         1.10 cm  LV E/e' medial:  10.8 LV IVS:        1.40 cm  LV e' lateral:   5.98 cm/s LVOT diam:     2.00 cm  LV E/e' lateral: 13.9 LV SV:         79 LV SV Index:   36 LVOT Area:     3.14 cm  RIGHT VENTRICLE RV Basal diam:  4.10 cm RV Mid diam:    2.80 cm LEFT ATRIUM           Index       RIGHT ATRIUM           Index LA diam:      2.30 cm 1.06 cm/m  RA Area:     12.10 cm LA Vol (A2C): 57.8 ml 26.60 ml/m RA Volume:   24.20 ml  11.14 ml/m LA Vol (A4C): 62.5 ml 28.76 ml/m  AORTIC VALVE AV Area (Vmax):    2.71 cm AV Area (Vmean):   2.65 cm AV Area (VTI):     2.73 cm AV Vmax:           146.00 cm/s AV Vmean:  97.100 cm/s AV VTI:            0.289 m AV Peak Grad:      8.5 mmHg AV Mean Grad:      4.0 mmHg LVOT Vmax:         126.00 cm/s LVOT Vmean:        81.900 cm/s LVOT VTI:          0.251 m LVOT/AV VTI ratio: 0.87  AORTA Ao Root diam: 3.70 cm Ao Asc diam:  3.10 cm MITRAL VALVE MV Area (PHT): 3.76 cm     SHUNTS MV Decel Time: 202 msec     Systemic VTI:  0.25 m MV E velocity: 83.10 cm/s   Systemic Diam: 2.00 cm MV A velocity: 102.00 cm/s MV E/A ratio:  0.81 Fransico Him MD Electronically signed by Fransico Him MD Signature Date/Time: 07/06/2020/12:26:30 PM    Final      LOS: 3 days   Oren Binet,  MD  [facility]    To contact the attending provider between 7A-7P or the covering provider during after hours 7P-7A, please log into the web site [email or web address] and access using universal [place] password for that web site. If you do not have the password, please call the hospital operator.  07/07/2020, 11:17 AM

## 2020-07-07 NOTE — Progress Notes (Signed)
  Speech Language Pathology Treatment: Dysphagia  Patient Details Name: Justin [name] MRN: [id number] DOB: May 20, 1940 Today's Date: 07/07/2020 Time: 6803-2122 SLP Time Calculation (min) (ACUTE ONLY): 17.35 min  Assessment / Plan / Recommendation Clinical Impression  Pt was seen during lunch for dysphagia treatment with his wife present for part of the session. Pt consumed a meal of meatloaf, mac and cheese, broccoli, pears, and thin liquids. Mastication and oral clearance were adequate with solids. Pt demonstrated inconsistent coughing with thin liquids via cup, and with dual consistency boluses of pears with thin liquids. A modified barium swallow study is recommended to further assess swallow function and it is currently scheduled for today at 1400.    HPI HPI: Pt is a 80 y.o. male who presented with worsening right leg weakness.  MRI brain 5/14: 12 mm early subacute ischemic infarct left frontal  corona radiata. Possible minimal associated petechial hemorrhage. 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 CXR 5/13: no acute process in the chest.  BSE 07/17/19 no s/s, 3 oz without difficulty- reg/thin recommended. Outpatient SLP services September-October 2021: overall mild-moderate deficits; SLP felt level of deficit is functionally moderate-severe with sustained/selective attention, memory, awareness, and language impairments. It was judged that the pt would be unlikely to make progress/achieve carryover without family involvement due to severity of deficits. However, family participation was inadequate despite SLP's efforts.      SLP Plan  MBS       Recommendations  Diet recommendations: Dysphagia 3 (mechanical soft);Nectar-thick liquid Liquids provided via: Cup;Straw Medication Administration: Crushed with puree Supervision: Patient able to self feed Compensations: Slow rate;Small sips/bites;Lingual  sweep for clearance of pocketing                Oral Care Recommendations: Oral care BID Follow up Recommendations: Other (comment) (TBD) SLP Visit Diagnosis: Dysphagia, unspecified (R13.10) Plan: MBS       Jolynn Bajorek I. Hardin Negus, [place], [place] Office number [phone] Pager [phone]                Horton Marshall 07/07/2020, 1:28 PM

## 2020-07-08 LAB — GLUCOSE, CAPILLARY
Glucose-Capillary: 95 mg/dL (ref 70–99)
Glucose-Capillary: 93 mg/dL (ref 70–99)
Glucose-Capillary: 152 mg/dL — ABNORMAL HIGH (ref 70–99)
Glucose-Capillary: 114 mg/dL — ABNORMAL HIGH (ref 70–99)

## 2020-07-08 LAB — SARS CORONAVIRUS 2 (TAT 6-24 HRS): SARS Coronavirus 2: NEGATIVE

## 2020-07-08 MED ORDER — GUAIFENESIN 100 MG/5ML PO SOLN
5.0000 mL | ORAL | Status: DC | PRN
  Administered 2020-07-08: 100 mg via ORAL
  Filled 2020-07-08: qty 5

## 2020-07-08 NOTE — TOC Progression Note (Addendum)
Transition of Care [facility]) - Progression Note    Patient Details  Name: ROBIE MCNIEL MRN: [id number] Date of Birth: 07-May-1940  Transition of Care [facility]) CM/SW [place], LCSW Phone Number: 07/08/2020, 11:38 AM  Clinical Narrative:    11:38am-CSW updated pasrr on Fl2. CSW left voicemail for patient's spouse (not at bedside) regarding additional SNF offer of [facility] in [place]. CSW will begin insurance authorization process.   1:42pm-CSW spoke with patient's spouse at bedside to discuss SNF options. She is in agreement with [facility] since they can do therapy 7 days a week. [facility] liaison to speak with her. Insurance authorization started and clinicals sent, Ref [id number]. COVID test requested.    Expected Discharge Plan: [place] Barriers to Discharge: Continued Medical Work up,SNF Pending bed offer  Expected Discharge Plan and Services Expected Discharge Plan: [place] Choice: [place] arrangements for the past 2 months: Single Family Home                                       Social Determinants of Health (SDOH) Interventions    Readmission Risk Interventions No flowsheet data found.

## 2020-07-08 NOTE — Progress Notes (Addendum)
PROGRESS NOTE        PATIENT DETAILS Name: Justin [name] Age: 80 y.o. Sex: male Date of Birth: [birth year] Admit Date: 07/04/2020 Admitting Physician Jacqlyn Krauss, MD [id number], Rebeca Alert, MD  Brief Narrative: Patient is a 80 y.o. male with history of prior CVA, HTN, HLD, DM-2-who presented with worsening right leg weakness-further evaluation revealed acute CVA.  Significant events: 5/13>> admitted with right leg weakness-found to have acute CVA.  Significant studies: 06/25/2019>> A1c 7.2 5/13>> chest x-ray: No pneumonia. 5/13>> CT head: No acute intracranial abnormality. 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 5/14>> LDL: 87 5/15>> A1c: 6.8 5/15>> Echo: EF 63-87%, grade 1 diastolic dysfunction.   Antimicrobial therapy: None  Microbiology data: 5/13>> COVID/influenza PCR: Negative  Procedures : None  Consults: Neurology  DVT Prophylaxis : SCD's Start: 07/04/20 2053   Subjective: Lying comfortable in bed-no major issues overnight.  Assessment/Plan: Acute CVA: Significant improvement in right leg weakness-work-up as above-stroke MD recommending aspirin/Plavix for 3 weeks followed by Plavix alone.  Awaiting SNF bed.     HLD: Continue statin/fenofibrate  HTN: BP reasonable-allow permissive hypertension.  DM-2-CBGs currently stable on SSI.  Recent Labs    07/07/20 1655 07/07/20 2051 07/08/20 0809  GLUCAP 117* 151* 95   Depression: Stable-continue Zoloft  History of dementia-with occasional delirium: Maintained on Depakote.  Asymptomatic bacteriuria: No urinary symptoms-stop Rocephin.   Diet: Diet Order            Diet regular Room service appropriate? Yes with Assist; Fluid  consistency: Thin  Diet effective now                  Code Status: DNR  Family Communication: Brayon [place] with [phone] -at bedside on 5/16  Disposition Plan: Status is: Inpatient  Remains inpatient appropriate because:Inpatient level of care appropriate due to severity of illness   Dispo: The patient is from: Home              Anticipated d/c is to: Home              Patient currently stable for discharge   Difficult to place patient yes   Barriers to Discharge: Awaiting SNF bed.  Antimicrobial agents: Anti-infectives (From admission, onward)   Start     Dose/Rate Route Frequency Ordered Stop   07/05/20 2000  cefTRIAXone (ROCEPHIN) 1 g in sodium chloride 0.9 % 100 mL IVPB  Status:  Discontinued        1 g 200 mL/hr over 30 Minutes Intravenous Every 24 hours 07/04/20 2059 07/05/20 1047   07/04/20 1915  cefTRIAXone (ROCEPHIN) 1 g in sodium chloride 0.9 % 100 mL IVPB        1 g 200 mL/hr over 30 Minutes Intravenous  Once 07/04/20 1914 07/05/20 0749       Time spent: 15- minutes-Greater than 50% of this time was spent in counseling, explanation of diagnosis, planning of further management, and coordination of care.  MEDICATIONS: Scheduled Meds: . aspirin EC  325 mg Oral Daily  . atorvastatin  80 mg Oral Daily  . chlorhexidine  15 mL Mouth Rinse BID  . clopidogrel  75 mg Oral Daily  .  divalproex  250 mg Oral Daily  . fenofibrate  160 mg Oral Daily  . insulin aspart  0-15 Units Subcutaneous TID WC  . mouth rinse  15 mL Mouth Rinse q12n4p  . sertraline  25 mg Oral QHS   Continuous Infusions:  PRN Meds:.acetaminophen **OR** acetaminophen (TYLENOL) oral liquid 160 mg/5 mL **OR** acetaminophen, LORazepam, naphazoline-glycerin, polyvinyl alcohol, senna-docusate   PHYSICAL EXAM: Vital signs: Vitals:   07/07/20 0400 07/07/20 1154 07/07/20 2055 07/08/20 0438  BP: 130/80 (!) 144/69 (!) 157/69 (!) 164/93  Pulse: (!) 58 60 62   Resp: 15 16 18 18   Temp:  98.2 F (36.8 C) 98.3 F (36.8 C) 98.5 F (36.9 C) 97.8 F (36.6 C)  TempSrc: Axillary Oral Oral Oral  SpO2: 95% 96% 96% 95%  Weight:      Height:       Filed Weights   07/04/20 1535 07/05/20 0447 07/06/20 0603  Weight: 90.7 kg 97 kg 97.4 kg   Body mass index is 29.95 kg/m.   Gen Exam:Alert awake-not in any distress HEENT:atraumatic, normocephalic Chest: B/L clear to auscultation anteriorly CVS:S1S2 regular Abdomen:soft non tender, non distended Extremities:no edema Neurology: Mild right-sided weakness. Skin: no rash  I have personally reviewed following labs and imaging studies  LABORATORY DATA: CBC: Recent Labs  Lab 07/04/20 1729 07/04/20 1745 07/05/20 0222  WBC 8.2  --  6.8  NEUTROABS 5.3  --   --   HGB 14.4 14.3 12.8*  HCT 44.1 42.0 39.1  MCV 83.5  --  83.2  PLT 266  --  [id number]    Basic Metabolic Panel: Recent Labs  Lab 07/04/20 1729 07/04/20 1745 07/05/20 0222 07/06/20 0125  NA 140 140 138 138  K 4.0 3.9 3.4* 4.0  CL 105 104 104 106  CO2 28  --  25 27  GLUCOSE 87 83 102* 116*  BUN 16 19 16 12   CREATININE 0.92 0.90 0.83 1.06  CALCIUM 9.3  --  8.7* 8.9  MG  --   --   --  2.2    GFR: Estimated Creatinine Clearance: 69.4 mL/min (by C-G formula based on SCr of 1.06 mg/dL).  Liver Function Tests: Recent Labs  Lab 07/04/20 1729 07/05/20 0222  AST 21 16  ALT 17 15  ALKPHOS 23* 21*  BILITOT 0.5 0.5  PROT 6.3* 5.5*  ALBUMIN 3.6 3.2*   No results for input(s): LIPASE, AMYLASE in the last 168 hours. No results for input(s): AMMONIA in the last 168 hours.  Coagulation Profile: Recent Labs  Lab 07/04/20 1729  INR 1.0    Cardiac Enzymes: No results for input(s): CKTOTAL, CKMB, CKMBINDEX, TROPONINI in the last 168 hours.  BNP (last 3 results) No results for input(s): PROBNP in the last 8760 hours.  Lipid Profile: No results for input(s): CHOL, HDL, LDLCALC, TRIG, CHOLHDL, LDLDIRECT in the last 72 hours.  Thyroid Function Tests: No  results for input(s): TSH, T4TOTAL, FREET4, T3FREE, THYROIDAB in the last 72 hours.  Anemia Panel: No results for input(s): VITAMINB12, FOLATE, FERRITIN, TIBC, IRON, RETICCTPCT in the last 72 hours.  Urine analysis:    Component Value Date/Time   COLORURINE YELLOW 07/04/2020 [place] 07/04/2020 1558   LABSPEC 1.016 07/04/2020 1558   PHURINE 6.0 07/04/2020 [place] 07/04/2020 [place] 07/04/2020 [place] 07/04/2020 [place] 07/04/2020 1558   PROTEINUR NEGATIVE 07/04/2020 1558   UROBILINOGEN 4.0 (H) 06/18/2009 1928  NITRITE NEGATIVE 07/04/2020 1558   LEUKOCYTESUR LARGE (A) 07/04/2020 1558    Sepsis Labs: Lactic Acid, Venous    Component Value Date/Time   LATICACIDVEN 1.7 06/18/2009 1838    MICROBIOLOGY: Recent Results (from the past 240 hour(s))  Resp Panel by RT-PCR (Flu A&B, Covid) Nasopharyngeal Swab     Status: None   Collection Time: 07/04/20  6:15 PM   Specimen: Nasopharyngeal Swab; Nasopharyngeal(NP) swabs in vial transport medium  Result Value Ref Range Status   SARS Coronavirus 2 by RT PCR NEGATIVE NEGATIVE Final    Comment: (NOTE) SARS-CoV-2 target nucleic acids are NOT DETECTED.  The SARS-CoV-2 RNA is generally detectable in upper respiratory specimens during the acute phase of infection. The lowest concentration of SARS-CoV-2 viral copies this assay can detect is 138 copies/mL. A negative result does not preclude SARS-Cov-2 infection and should not be used as the sole basis for treatment or other patient management decisions. A negative result may occur with  improper specimen collection/handling, submission of specimen other than nasopharyngeal swab, presence of viral mutation(s) within the areas targeted by this assay, and inadequate number of viral copies(<138 copies/mL). A negative result must be combined with clinical observations, patient history, and  epidemiological information. The expected result is Negative.  Fact Sheet for Patients:  EntrepreneurPulse.com.au  Fact Sheet for Healthcare Providers:  IncredibleEmployment.be  This test is no t yet approved or cleared by the [place] FDA and  has been authorized for detection and/or diagnosis of SARS-CoV-2 by FDA under an Emergency Use Authorization (EUA). This EUA will remain  in effect (meaning this test can be used) for the duration of the COVID-19 declaration under Section 564(b)(1) of the Act, 21 U.S.C.section 360bbb-3(b)(1), unless the authorization is terminated  or revoked sooner.       Influenza A by PCR NEGATIVE NEGATIVE Final   Influenza B by PCR NEGATIVE NEGATIVE Final    Comment: (NOTE) The Xpert Xpress SARS-CoV-2/FLU/RSV plus assay is intended as an aid in the diagnosis of influenza from Nasopharyngeal swab specimens and should not be used as a sole basis for treatment. Nasal washings and aspirates are unacceptable for Xpert Xpress SARS-CoV-2/FLU/RSV testing.  Fact Sheet for Patients: EntrepreneurPulse.com.au  Fact Sheet for Healthcare Providers: IncredibleEmployment.be  This test is not yet approved or cleared by the [place] FDA and has been authorized for detection and/or diagnosis of SARS-CoV-2 by FDA under an Emergency Use Authorization (EUA). This EUA will remain in effect (meaning this test can be used) for the duration of the COVID-19 declaration under Section 564(b)(1) of the Act, 21 U.S.C. section 360bbb-3(b)(1), unless the authorization is terminated or revoked.  Performed at [facility], [address] [place],  [address]     RADIOLOGY STUDIES/RESULTS: DG Swallowing Func-Speech Pathology  Result Date: 07/07/2020 Objective Swallowing Evaluation: Type of Study: MBS-Modified Barium Swallow Study  Patient Details Name: CEJAY HOEKSEMA MRN: [id number]  Date of Birth: 1940/11/11 Today's Date: 07/07/2020 Time: SLP Start Time (ACUTE ONLY): 1252 -SLP Stop Time (ACUTE ONLY): 1306 SLP Time Calculation (min) (ACUTE ONLY): 14 min Past Medical History: Past Medical History: Diagnosis Date . Brain bleed ([place])  . Cancer [facility])   prostate . CHI (closed head injury) 04/2013  with [facility] . Depression  . Diabetes mellitus without complication ([place])  . High cholesterol  . Hypertension  . Stroke [facility])  Past Surgical History: Past Surgical History: Procedure Laterality Date . arm & hand surgery Left 1972  skill saw accident . COLON  SURGERY    partial . EYE SURGERY    lens implant and cateract- Left . PROSTATECTOMY   HPI: Pt is a 80 y.o. male who presented with worsening right leg weakness.  MRI brain 5/14: 12 mm early subacute ischemic infarct left frontal  corona radiata. Possible minimal associated petechial hemorrhage. 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 CXR 5/13: no acute process in the chest.  BSE 07/17/19 no s/s, 3 oz without difficulty- reg/thin recommended. Outpatient SLP services September-October 2021: overall mild-moderate deficits; SLP felt level of deficit is functionally moderate-severe with sustained/selective attention, memory, awareness, and language impairments. It was judged that the pt would be unlikely to make progress/achieve carryover without family involvement due to severity of deficits. However, family participation was inadequate despite SLP's efforts.  No data recorded Assessment / Plan / Recommendation CHL IP CLINICAL IMPRESSIONS 07/07/2020 Clinical Impression Pt presents with pharyngeal dysphagia characterized by reduced anterior laryngeal movement and a pharyngeal delay. He demonstrated penetration (PAS 3, 5) and aspiration (PAS 7, 8) with thin liquids via cup and straw and with nectar thick liquids. With thin liquids via cup, aspiration of penetrated material was  trace and occurred after deglutition. Aspiration was most significant with thin liquids via straw. Prompted coughing was effective with trace aspiration, but not with larger quantities via straw. Coughing was ineffective in expelling nectar thick penetrate. Penetration was improved to PAS 2 and aspiration eliminated with individual sips of thin liquids via cup when a chin tuck posture was used. A regular texture diet with thin liquids via cup is recommended at this time with strict observance of swallowing precautions and with full supervision to ensure consistent use of chin tuck posture. SLP will continue to follow pt. SLP Visit Diagnosis Dysphagia, pharyngeal phase (R13.13) Attention and concentration deficit following -- Frontal lobe and executive function deficit following -- Impact on safety and function Mild aspiration risk   CHL IP TREATMENT RECOMMENDATION 07/07/2020 Treatment Recommendations Therapy as outlined in treatment plan below   Prognosis 07/07/2020 Prognosis for Safe Diet Advancement Good Barriers to Reach Goals Cognitive deficits Barriers/Prognosis Comment -- CHL IP DIET RECOMMENDATION 07/07/2020 SLP Diet Recommendations Regular solids;Thin liquid Liquid Administration via Cup;No straw Medication Administration Whole meds with puree Compensations Slow rate;Small sips/bites;Chin tuck Postural Changes Seated upright at 90 degrees   No flowsheet data found.  CHL IP FOLLOW UP RECOMMENDATIONS 07/07/2020 Follow up Recommendations Skilled Nursing facility   [facility] IP FREQUENCY AND DURATION 07/07/2020 Speech Therapy Frequency (ACUTE ONLY) min 2x/week Treatment Duration 2 weeks      CHL IP ORAL PHASE 07/07/2020 Oral Phase WFL Oral - Pudding Teaspoon -- Oral - Pudding Cup -- Oral - Honey Teaspoon -- Oral - Honey Cup -- Oral - Nectar Teaspoon -- Oral - Nectar Cup -- Oral - Nectar Straw -- Oral - Thin Teaspoon -- Oral - Thin Cup -- Oral - Thin Straw -- Oral - Puree -- Oral - Mech Soft -- Oral - Regular -- Oral -  Multi-Consistency -- Oral - Pill -- Oral Phase - Comment --  CHL IP PHARYNGEAL PHASE 07/07/2020 Pharyngeal Phase Impaired Pharyngeal- Pudding Teaspoon -- Pharyngeal -- Pharyngeal- Pudding Cup -- Pharyngeal -- Pharyngeal- Honey Teaspoon -- Pharyngeal -- Pharyngeal- Honey Cup -- Pharyngeal -- Pharyngeal- Nectar Teaspoon -- Pharyngeal -- Pharyngeal- Nectar Cup Reduced anterior laryngeal mobility;Delayed swallow initiation-pyriform sinuses;Penetration/Aspiration during swallow Pharyngeal Material enters airway, remains ABOVE vocal cords and not ejected out;Material  enters airway, CONTACTS cords and not ejected out Pharyngeal- Nectar Straw Reduced anterior laryngeal mobility;Delayed swallow initiation-pyriform sinuses;Penetration/Aspiration during swallow Pharyngeal Material enters airway, remains ABOVE vocal cords and not ejected out;Material enters airway, CONTACTS cords and not ejected out;Material enters airway, passes BELOW cords and not ejected out despite cough attempt by patient Pharyngeal- Thin Teaspoon -- Pharyngeal -- Pharyngeal- Thin Cup Reduced anterior laryngeal mobility;Delayed swallow initiation-pyriform sinuses;Penetration/Aspiration during swallow Pharyngeal Material enters airway, remains ABOVE vocal cords and not ejected out;Material enters airway, CONTACTS cords and not ejected out;Material enters airway, passes BELOW cords without attempt by patient to eject out (silent aspiration) Pharyngeal- Thin Straw Reduced anterior laryngeal mobility;Delayed swallow initiation-pyriform sinuses;Penetration/Aspiration during swallow;Penetration/Apiration after swallow Pharyngeal Material enters airway, passes BELOW cords and not ejected out despite cough attempt by patient Pharyngeal- Puree Reduced anterior laryngeal mobility;Delayed swallow initiation-pyriform sinuses Pharyngeal -- Pharyngeal- Mechanical Soft -- Pharyngeal -- Pharyngeal- Regular Reduced anterior laryngeal mobility;Delayed swallow  initiation-pyriform sinuses Pharyngeal -- Pharyngeal- Multi-consistency -- Pharyngeal -- Pharyngeal- Pill Reduced anterior laryngeal mobility;Delayed swallow initiation-pyriform sinuses Pharyngeal -- Pharyngeal Comment --  CHL IP CERVICAL ESOPHAGEAL PHASE 07/07/2020 Cervical Esophageal Phase WFL Pudding Teaspoon -- Pudding Cup -- Honey Teaspoon -- Honey Cup -- Nectar Teaspoon -- Nectar Cup -- Nectar Straw -- Thin Teaspoon -- Thin Cup -- Thin Straw -- Puree -- Mechanical Soft -- Regular -- Multi-consistency -- Pill -- Cervical Esophageal Comment -- Shanika I. Hardin Negus, [place], [place] Office number [phone] Pager [place] 07/07/2020, 2:55 PM                LOS: 4 days   Oren Binet, MD  [facility]    To contact the attending provider between 7A-7P or the covering provider during after hours 7P-7A, please log into the web site [email or web address] and access using universal [place] password for that web site. If you do not have the password, please call the hospital operator.  07/08/2020, 12:02 PM

## 2020-07-08 NOTE — Progress Notes (Signed)
  Speech Language Pathology Treatment: Dysphagia  Patient Details Name: Justin [name] MRN: [id number] DOB: 1940/03/27 Today's Date: 07/08/2020 Time: 1045-1100 SLP Time Calculation (min) (ACUTE ONLY): 15 min  Assessment / Plan / Recommendation Clinical Impression  Pt seen upright in chair at bedside with thin liquid and puree snack. Reinforced findings of recent MBSS including rationale for use of chin tuck with liquids to reduce occurrence of aspiration. Pt required moderate cues for implementation. Delayed cough noted with thin liquids x1 when chin tuck was not properly implemented. No overt s/sx of aspiration with puree snack. Pt will require full supervision and cueing for recall of strategies to maximize swallow safety. Current diet remains most appropriate. SLP will continue to follow.    HPI HPI: Pt is a 80 y.o. male who presented with worsening right leg weakness.  MRI brain 5/14: 12 mm early subacute ischemic infarct left frontal  corona radiata. Possible minimal associated petechial hemorrhage. 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 CXR 5/13: no acute process in the chest.  BSE 07/17/19 no s/s, 3 oz without difficulty- reg/thin recommended. Outpatient SLP services September-October 2021: overall mild-moderate deficits; SLP felt level of deficit is functionally moderate-severe with sustained/selective attention, memory, awareness, and language impairments. It was judged that the pt would be unlikely to make progress/achieve carryover without family involvement due to severity of deficits. However, family participation was reportedly inadequate despite SLP's efforts.      SLP Plan  Continue with current plan of care       Recommendations  Diet recommendations: Thin liquid;Regular Liquids provided via: Cup;No straw Medication Administration: Whole meds with puree Supervision: Patient able to self  feed;Full supervision/cueing for compensatory strategies Compensations: Slow rate;Small sips/bites;Minimize environmental distractions;Chin tuck Postural Changes and/or Swallow Maneuvers: Seated upright 90 degrees;Upright 30-60 min after meal;Chin tuck                Oral Care Recommendations: Oral care BID Follow up Recommendations: Skilled Nursing facility SLP Visit Diagnosis: Dysphagia, pharyngeal phase (R13.13) Plan: Continue with current plan of care       [place], CCC-SLP Acute Rehabilitation Services   07/08/2020, 11:18 AM

## 2020-07-08 NOTE — Progress Notes (Signed)
Occupational Therapy Treatment Patient Details Name: Justin [name] MRN: [id number] DOB: 1940/10/13 Today's Date: 07/08/2020    History of present illness 80 y.o. male with medical history significant of prior CVA baseline left upper extremity lower extremity weakness right lower extremity weakness, hypertension, hyperlipidemia presents with worsening dysarthria and right lower leg weakness.  MRI suggests:early subacute ischemic infarct involving the left frontal  corona radiata.  Patient with UTI as well.   OT comments  Pt progressing toward OT goals. Initially planned to take steps toward sink today, but pt experienced difficulty advancing RLE. Pt with extensive posterior right lateral lean requiring +2 assist to maintain standing balance. Ultimately, pt able to stand pivot transfer to chair with max A +2. Guided patient in ADLs seated at sink due to difficulty maintaining static standing balance. Pt overall mod assist for oral care seated at sink with multimodal cues to maintain midline and sequencing task. At end of session, pt left in chair with pillow positioned to encourage optimal posture in chair. Continue to recommend SNF at discharge.    Follow Up Recommendations  SNF    Equipment Recommendations  Wheelchair (measurements OT);Wheelchair cushion (measurements OT)    Recommendations for Other Services      Precautions / Restrictions Precautions Precautions: Fall Restrictions Weight Bearing Restrictions: No       Mobility Bed Mobility Overal bed mobility: Needs Assistance Bed Mobility: Supine to Sit     Supine to sit: Mod assist;HOB elevated     General bed mobility comments: ModA to manage trunk to upright, strong posterior/right lateral lean.    Transfers Overall transfer level: Needs assistance Equipment used: Rolling walker (2 wheeled) Transfers: Sit to/from [facility] Sit to Stand: Mod assist;+2 physical assistance Stand pivot transfers: Max  assist;+2 physical assistance;+2 safety/equipment       General transfer comment: multiple sit to stands from bedside and recliner chair with RW. Requires physical assistance in standing to correct posture and maintain balance. Max A to maintain balance due to heavy posterior lean, verbal cues to move RLE, and assistance to manage RW during stand pivot transfer from bed to chair.    Balance Overall balance assessment: Needs assistance Sitting-balance support: Feet supported;Bilateral upper extremity supported Sitting balance-Leahy Scale: Poor Sitting balance - Comments: Consistent posterior/lateral lean during sitting. Required support from BUE to maintain sitting balance. Postural control: Posterior lean;Right lateral lean Standing balance support: Bilateral upper extremity supported;During functional activity Standing balance-Leahy Scale: Poor Standing balance comment: Reliant on BUE support and external support from therapist to maintain standing balance                           ADL either performed or assessed with clinical judgement   ADL Overall ADL's : Needs assistance/impaired     Grooming: Moderate assistance;Oral care;Sitting;Standing Grooming Details (indicate cue type and reason): Attempted brushing teeth in standing at sink but pt with heavy R lateral/posterior lean and unable to maintain balance with one UE/external support. Pt overall Mod A to brush teeth seated with assist for bimanual tasks and to maintain midline sitting at sink. Cued to use mirror to assess posture and correct         Upper Body Dressing : Minimal assistance;Sitting Upper Body Dressing Details (indicate cue type and reason): Min A to don around back sitting EOB     Toilet Transfer: Maximal assistance;+2 for safety/equipment;+2 for physical assistance;RW Toilet Transfer Details (indicate cue type and reason):  simulated to recliner, multimodal cues to correct balance and advance R LE            General ADL Comments: Pt limited in core strength and endurance contributing to inability to maintain midline duriing seated and standing tasks.     Vision   Vision Assessment?: No apparent visual deficits   Perception     Praxis      Cognition Arousal/Alertness: Awake/alert Behavior During Therapy: WFL for tasks assessed/performed Overall Cognitive Status: History of cognitive impairments - at baseline                                 General Comments: seems to have some expressive difficulties, able  to anwer yes/no questions. Slow processing and needs simple, step by step cues. Benefits from one step commands and additional time        Exercises     Shoulder Instructions       General Comments      Pertinent Vitals/ Pain       Pain Assessment: No/denies pain  Home Living                                          Prior Functioning/Environment              Frequency  Min 2X/week        Progress Toward Goals  OT Goals(current goals can now be found in the care plan section)  Progress towards OT goals: Progressing toward goals  Acute Rehab OT Goals Patient Stated Goal: no goals stated OT Goal Formulation: Patient unable to participate in goal setting Time For Goal Achievement: 07/19/20 Potential to Achieve Goals: Fair ADL Goals Pt Will Perform Eating: Independently Pt Will Perform Grooming: standing;with min assist Pt Will Perform Upper Body Bathing: Independently;sitting Pt Will Perform Upper Body Dressing: Independently Pt Will Transfer to Toilet: bedside commode;stand pivot transfer;with min guard assist  Plan Discharge plan remains appropriate    Co-evaluation                 AM-PAC OT "6 Clicks" Daily Activity     Outcome Measure   Help from another person eating meals?: A Little Help from another person taking care of personal grooming?: A Little Help from another person toileting, which  includes using toliet, bedpan, or urinal?: A Lot Help from another person bathing (including washing, rinsing, drying)?: A Lot Help from another person to put on and taking off regular upper body clothing?: A Little Help from another person to put on and taking off regular lower body clothing?: A Lot 6 Click Score: 15    End of Session Equipment Utilized During Treatment: Gait belt;Rolling walker  OT Visit Diagnosis: Unsteadiness on feet (R26.81);Other abnormalities of gait and mobility (R26.89);Muscle weakness (generalized) (M62.81);Other symptoms and signs involving cognitive function;Hemiplegia and hemiparesis Hemiplegia - Right/Left: Right Hemiplegia - dominant/non-dominant: Dominant Hemiplegia - caused by: Cerebral infarction   Activity Tolerance Patient tolerated treatment well   Patient Left in chair;with call bell/phone within reach;with chair alarm set;with SCD's reapplied   Nurse Communication Mobility status        Time: 7322-0254 OT Time Calculation (min): 37 min  Charges: OT General Charges $OT Visit: 1 Visit OT Treatments $Self Care/Home Management : 8-22 mins $Therapeutic Activity: 8-22 mins  Almyra Free B, OTR/L  Acute Rehab Services Office: [phone]   Layla Maw 07/08/2020, 2:11 PM

## 2020-07-09 DIAGNOSIS — R8271 Bacteriuria: Secondary | ICD-10-CM

## 2020-07-09 DIAGNOSIS — E1165 Type 2 diabetes mellitus with hyperglycemia: Secondary | ICD-10-CM

## 2020-07-09 LAB — GLUCOSE, CAPILLARY
Glucose-Capillary: 107 mg/dL — ABNORMAL HIGH (ref 70–99)
Glucose-Capillary: 124 mg/dL — ABNORMAL HIGH (ref 70–99)
Glucose-Capillary: 134 mg/dL — ABNORMAL HIGH (ref 70–99)

## 2020-07-09 MED ORDER — ASPIRIN 325 MG PO TBEC
325.0000 mg | DELAYED_RELEASE_TABLET | Freq: Every day | ORAL | 0 refills | Status: AC
Start: 2020-07-10 — End: ?

## 2020-07-09 MED ORDER — LABETALOL HCL 5 MG/ML IV SOLN
20.0000 mg | INTRAVENOUS | Status: DC | PRN
  Filled 2020-07-09: qty 4

## 2020-07-09 MED ORDER — AMLODIPINE BESYLATE 5 MG PO TABS
5.0000 mg | ORAL_TABLET | Freq: Every day | ORAL | Status: DC
  Administered 2020-07-09: 5 mg via ORAL
  Filled 2020-07-09: qty 1

## 2020-07-09 NOTE — Progress Notes (Signed)
Attempted to call [place] to give report on pt. Phone rings and rings when transferred. Unable to leave voicemail. Will attempt again.

## 2020-07-09 NOTE — TOC Transition Note (Signed)
Transition of Care [facility]) - CM/SW Discharge Note   Patient Details  Name: Justin [name] MRN: [id number] Date of Birth: Jan 01, 1941  [facility] of [facility]) CM/SW Contact:  Benard Halsted, LCSW Phone Number: 07/09/2020, 1:13 PM   Clinical Narrative:    Patient will DC to: [place] Anticipated DC date: 07/09/20 Family notified: Spouse at bedside Transport by: Domenica Reamer   Per MD patient ready for DC to [facility]. RN to call report prior to discharge [phone]). RN, patient, patient's family, and facility notified of DC. Discharge Summary and FL2 sent to facility. DC packet on chart. Ambulance transport requested for patient.   CSW will sign off for now as social work intervention is no longer needed. Please consult [place] again if new needs arise.      Final next level of care: Skilled Nursing Facility Barriers to Discharge: Barriers Resolved   Patient Goals and CMS Choice Patient states their goals for this hospitalization and ongoing recovery are:: "get him back on his feet" CMS Medicare.gov Compare Post Acute Care list provided to:: Patient Represenative (must comment) Choice offered to / list presented to : Spouse  Discharge Placement   Existing PASRR number confirmed : 07/09/20          Patient chooses bed at: [facility] Patient to be transferred to facility by: [place] Name of family member notified: Spouse Patient and family notified of of transfer: 07/09/20  Discharge Plan and Services     Post Acute Care Choice: [place]                               Social Determinants of Health (SDOH) Interventions     Readmission Risk Interventions No flowsheet data found.

## 2020-07-09 NOTE — Progress Notes (Signed)
Physical Therapy Treatment Patient Details Name: Justin [name] MRN: [id number] DOB: 1940-10-10 Today's Date: 07/09/2020    History of Present Illness 80 y.o. male with medical history significant of prior CVA baseline left upper extremity lower extremity weakness right lower extremity weakness, hypertension, hyperlipidemia presents with worsening dysarthria and right lower leg weakness.  MRI suggests:early subacute ischemic infarct involving the left frontal  corona radiata.  Patient with UTI as well.    PT Comments    Patient received in bed, very cheerful and jovial today. Spouse present and helped with session via chair follow. Able to mobilize with two person assist for safety, and focused on taking sidesteps along EOB as well as gait in room with RW. Still needs quite a bit of physical assistance for progression of RLE, however this improved as we continued to practice gait. Gait distance limited by fatigue. Left in bed with all need met, spouse present and bed alarm active. Continue to recommend SNF.    Follow Up Recommendations  SNF;Supervision/Assistance - 24 hour     Equipment Recommendations  Rolling walker with 5" wheels;3in1 (PT);Wheelchair (measurements PT);Wheelchair cushion (measurements PT)    Recommendations for Other Services       Precautions / Restrictions Precautions Precautions: Fall Precaution Comments: difficulty progressing RLE Restrictions Weight Bearing Restrictions: No    Mobility  Bed Mobility Overal bed mobility: Needs Assistance Bed Mobility: Supine to Sit     Supine to sit: Mod assist;HOB elevated     General bed mobility comments: ModA to help manage BLEs and bring trunk to upright, but able to progress LEs much better today; posterior lean initially needing Min-ModA for upright but faded to min guard    Transfers Overall transfer level: Needs assistance Equipment used: Rolling walker (2 wheeled) Transfers: Sit to/from Stand Sit to  Stand: Max assist;+2 physical assistance         General transfer comment: MaxAx2 to perform multiple stands from bed height today as well as to correct posterior lean in standing; once balanced able to take side steps along side EOB with ModAx2 for RLE and RW management  Ambulation/Gait Ambulation/Gait assistance: Mod assist;+2 safety/equipment Gait Distance (Feet): 10 Feet Assistive device: Rolling walker (2 wheeled) Gait Pattern/deviations: Step-through pattern;Decreased step length - right;Decreased dorsiflexion - right;Decreased weight shift to right;Trunk flexed;Narrow base of support Gait velocity: decr   General Gait Details: ModAx1 for weight shifting and progression of RLE and RW, second person for safety and line management, and spouse followed wtih recliner chair   Stairs             Wheelchair Mobility    Modified Rankin (Stroke Patients Only)       Balance Overall balance assessment: Needs assistance Sitting-balance support: Feet supported;Bilateral upper extremity supported Sitting balance-Leahy Scale: Poor Sitting balance - Comments: posterior lean requiring assist to maintain midline at first at EOB, faded to close min guard Postural control: Posterior lean Standing balance support: Bilateral upper extremity supported;During functional activity Standing balance-Leahy Scale: Poor Standing balance comment: ModA x1 for balance, second person min guard for safety                            Cognition Arousal/Alertness: Awake/alert Behavior During Therapy: WFL for tasks assessed/performed Overall Cognitive Status: History of cognitive impairments - at baseline  General Comments: very cheerful today, making jokes and tickling therapist's wrist when I was helping him to hold his arm still for BP measurements; still with slow processing and benefits form simple cues/extra processing time       Exercises      General Comments General comments (skin integrity, edema, etc.): diastolic BP extremely elevated (119) when we entered room, retook BP multiple times as he kept moving his arm, finally able to get accurate BP with diastolic value in the 54O      Pertinent Vitals/Pain Pain Assessment: Faces Faces Pain Scale: No hurt Pain Intervention(s): Monitored during session    Home Living                      Prior Function            PT Goals (current goals can now be found in the care plan section) Acute Rehab PT Goals Patient Stated Goal: no goals stated PT Goal Formulation: Patient unable to participate in goal setting Time For Goal Achievement: 07/19/20 Progress towards PT goals: Progressing toward goals    Frequency    Min 3X/week      PT Plan Current plan remains appropriate    Co-evaluation              AM-PAC PT "6 Clicks" Mobility   Outcome Measure  Help needed turning from your back to your side while in a flat bed without using bedrails?: A Lot Help needed moving from lying on your back to sitting on the side of a flat bed without using bedrails?: A Lot Help needed moving to and from a bed to a chair (including a wheelchair)?: A Lot Help needed standing up from a chair using your arms (e.g., wheelchair or bedside chair)?: A Lot Help needed to walk in hospital room?: Total Help needed climbing 3-5 steps with a railing? : Total 6 Click Score: 10    End of Session Equipment Utilized During Treatment: Gait belt Activity Tolerance: Patient tolerated treatment well Patient left: in bed;with call bell/phone within reach;with bed alarm set;with family/visitor present Nurse Communication: Mobility status PT Visit Diagnosis: Unsteadiness on feet (R26.81);Other abnormalities of gait and mobility (R26.89);Difficulty in walking, not elsewhere classified (R26.2)     Time: 2703-5009 PT Time Calculation (min) (ACUTE ONLY): 23 min  Charges:   $Gait Training: 8-22 mins $Therapeutic Activity: 8-22 mins                     Windell Norfolk, DPT, PN1   Supplemental Physical Therapist [place]    Pager [phone] Acute Rehab Office [phone]

## 2020-07-09 NOTE — TOC Progression Note (Signed)
Transition of Care [facility]) - Progression Note    Patient Details  Name: Justin [name] MRN: [id number] Date of Birth: 1940-08-24  Transition of Care [facility] [place] CM/SW [place], LCSW Phone Number: 07/09/2020, 9:17 AM  Clinical Narrative:    CSW received insurance approval for [facility]: Ref #[id number]/ Auth ID [id number], effective 07/09/20-07/11/20.   Expected Discharge Plan: [place] Barriers to Discharge: Continued Medical Work up,SNF Pending bed offer  Expected Discharge Plan and Services Expected Discharge Plan: [place] Choice: [place] arrangements for the past 2 months: Single Family Home                                       Social Determinants of Health (SDOH) Interventions    Readmission Risk Interventions No flowsheet data found.

## 2020-07-09 NOTE — Progress Notes (Signed)
Pt is awake in bed; oriented to self and knows he's in hospital. Not oriented to time or situation. Vitals stable. Sats stable on room air. Denies pain. No distress noted. No SOB noted. Shift assessment complete. Immediate needs addressed. Repositioned for comfort in the bed with pillow. Bed in lowest position with call light within reach and 3 side rails up. Bed alarm on. Door open. Will continue to assess. Awaiting EMS ride, pt discharged.

## 2020-07-09 NOTE — Plan of Care (Signed)
  Problem: Education: Goal: Knowledge of disease or condition will improve Outcome: Adequate for Discharge Goal: Knowledge of secondary prevention will improve Outcome: Adequate for Discharge Goal: Knowledge of patient specific risk factors addressed and post discharge goals established will improve Outcome: Adequate for Discharge Goal: Individualized Educational Video(s) Outcome: Adequate for Discharge   Problem: Coping: Goal: Will verbalize positive feelings about self Outcome: Adequate for Discharge Goal: Will identify appropriate support needs Outcome: Adequate for Discharge   Problem: Health Behavior/Discharge Planning: Goal: Ability to manage health-related needs will improve Outcome: Adequate for Discharge   Problem: Self-Care: Goal: Ability to participate in self-care as condition permits will improve Outcome: Adequate for Discharge Goal: Verbalization of feelings and concerns over difficulty with self-care will improve Outcome: Adequate for Discharge Goal: Ability to communicate needs accurately will improve Outcome: Adequate for Discharge   Problem: Nutrition: Goal: Risk of aspiration will decrease Outcome: Adequate for Discharge Goal: Dietary intake will improve Outcome: Adequate for Discharge

## 2020-07-09 NOTE — Progress Notes (Addendum)
EMS picked up pt via stretcher. IV removed. Tele notified of discharge and tele removed from pt. Belongings sent with patient. Immediate needs addressed. Will attempt to call report to facility. Paperwork sent with pt.

## 2020-07-09 NOTE — Discharge Summary (Signed)
PATIENT DETAILS Name: Justin [name] Age: 80 y.o. Sex: male Date of Birth: 08-25-1940 MRN: [id number]. Admitting Physician: Jacqlyn Krauss, MD [id number], Rebeca Alert, MD  Admit Date: 07/04/2020 Discharge date: 07/09/2020  Recommendations for Outpatient Follow-up:  1. Follow up with PCP in 1-2 weeks 2. Please obtain CMP/CBC in one week 3. Please ensure follow-up with neurology/stroke clinic 4. Aspirin/Plavix x3 weeks followed by Plavix alone   Admitted From:  Home  Disposition: SNF   Home Health: No  Equipment/Devices: None  Discharge Condition: Stable  CODE STATUS: DNR  Diet recommendation:  Diet Order            Diet - low sodium heart healthy           Diet Carb Modified           Diet regular Room service appropriate? Yes with Assist; Fluid consistency: Thin  Diet effective now                 Brief Narrative: Patient is a 80 y.o. male with history of prior CVA, HTN, HLD, DM-2-who presented with worsening right leg weakness-further evaluation revealed acute CVA.  Significant events: 5/13>> admitted with right leg weakness-found to have acute CVA.  Significant studies: 06/25/2019>> A1c 7.2 5/13>> chest x-ray: No pneumonia. 5/13>> CT head: No acute intracranial abnormality. 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 5/14>> LDL: 87 5/15>> A1c: 6.8 5/15>> Echo: EF [id number], grade 1 diastolic dysfunction.  Antimicrobial therapy: None  Microbiology data: 5/13>> COVID/influenza PCR: Negative  Procedures : None  Consults: Neurology  Brief Hospital Course: Acute CVA: Significant improvement in right leg weakness-work-up as above-stroke MD recommending aspirin/Plavix for 3 weeks followed by Plavix alone.   Awaiting SNF bed.     HLD: Continue statin/fenofibrate  HTN:  Initially permissive hypertension was allowed-subsequently started on amlodipine-please restart HCTZ over the next few days depending on how his blood pressure does.  DM-2-CBGs currently stable on SSI.  Plan is to resume usual outpatient diabetic regimen on discharge.  Follow with PCP for further optimization.  Depression: Stable-continue Zoloft  History of dementia-with occasional delirium: Maintained on Depakote.  Asymptomatic bacteriuria: No urinary symptoms-no longer on Rocephin.   Discharge Diagnoses:  Principal Problem:   Right leg weakness Active Problems:   HLD (hyperlipidemia)   Essential hypertension   Type 2 diabetes mellitus (HCC)   Depression   Stroke (HCC)   Major depressive disorder, recurrent episode, moderate (HCC)   Benign essential HTN   UTI (urinary tract infection)   Bacteria in urine   Discharge Instructions:  Activity:  As tolerated with Full fall precautions use walker/cane & assistance as needed   Discharge Instructions    Ambulatory referral to Neurology   Complete by: As directed    Follow up with stroke clinic NP (Jessica Vanschaick or Cecille Rubin, if both not available, consider Zachery Dauer, or Ahern) at [facility] in about 4 weeks. Thanks.   Diet - low sodium heart healthy   Complete by: As directed    Diet Carb Modified   Complete by: As directed    Discharge instructions   Complete by: As directed    Follow with Primary MD  Chesley Noon, MD in 1-2 weeks  Please get a complete blood count and chemistry panel checked by your  Primary MD at your next visit, and again as instructed by your Primary MD.  Get Medicines reviewed and adjusted: Please take all your medications with you for your next visit with your Primary MD  Laboratory/radiological data: Please request your Primary MD to go over all hospital tests and procedure/radiological results at the follow up,  please ask your Primary MD to get all Hospital records sent to his/her office.  In some cases, they will be blood work, cultures and biopsy results pending at the time of your discharge. Please request that your primary care M.D. follows up on these results.  Also Note the following: If you experience worsening of your admission symptoms, develop shortness of breath, life threatening emergency, suicidal or homicidal thoughts you must seek medical attention immediately by calling 911 or calling your MD immediately  if symptoms less severe.  You must read complete instructions/literature along with all the possible adverse reactions/side effects for all the Medicines you take and that have been prescribed to you. Take any new Medicines after you have completely understood and accpet all the possible adverse reactions/side effects.   Do not drive when taking Pain medications or sleeping medications (Benzodaizepines)  Do not take more than prescribed Pain, Sleep and Anxiety Medications. It is not advisable to combine anxiety,sleep and pain medications without talking with your primary care practitioner  Special Instructions: If you have smoked or chewed Tobacco  in the last 2 yrs please stop smoking, stop any regular Alcohol  and or any Recreational drug use.  Wear Seat belts while driving.  Please note: You were cared for by a hospitalist during your hospital stay. Once you are discharged, your primary care physician will handle any further medical issues. Please note that NO REFILLS for any discharge medications will be authorized once you are discharged, as it is imperative that you return to your primary care physician (or establish a relationship with a primary care physician if you do not have one) for your post hospital discharge needs so that they can reassess your need for medications and monitor your lab values.   Check CBGs before meals and at bedtime.   Increase activity slowly   Complete  by: As directed      Allergies as of 07/09/2020   No Known Allergies     Medication List    STOP taking these medications   hydrochlorothiazide 25 MG tablet Commonly known as: HYDRODIURIL     TAKE these medications   acetaminophen 500 MG tablet Commonly known as: TYLENOL Take 500 mg by mouth every 6 (six) hours as needed for moderate pain. What changed: Another medication with the same name was removed. Continue taking this medication, and follow the directions you see here.   amLODipine 5 MG tablet Commonly known as: NORVASC Take 1 tablet (5 mg total) by mouth daily.   aspirin 325 MG EC tablet Take 1 tablet (325 mg total) by mouth daily. Take for [redacted] weeks along with Plavix and then stop. Start taking on: Jul 10, 2020   atorvastatin 80 MG tablet Commonly known as: LIPITOR Take 1 tablet (80 mg total) by mouth daily.   clopidogrel 75 MG tablet Commonly known as: PLAVIX Take 1 tablet (75 mg total) by mouth daily.   cyanocobalamin 1000 MCG tablet Commonly known as: [employer] VITAMIN B12 Take 1 tablet (1,000 mcg total) by mouth daily.   divalproex 250 MG 24 hr tablet Commonly known as: Depakote ER Take 1 tablet (250 mg total) by mouth  daily. What changed: when to take this   fenofibrate 160 MG tablet Take 1 tablet (160 mg total) by mouth daily.   Fish Oil 1000 MG Caps Take 1,000 mg by mouth daily with breakfast.   glipiZIDE 5 MG tablet Commonly known as: GLUCOTROL Take 0.5 tablets (2.5 mg total) by mouth daily before breakfast.   metFORMIN 500 MG tablet Commonly known as: GLUCOPHAGE Take 1 tablet (500 mg total) by mouth 2 (two) times daily with a meal.   polyvinyl alcohol 1.4 % ophthalmic solution Commonly known as: LIQUIFILM TEARS Place 1 drop into both eyes 4 (four) times daily as needed for dry eyes.   Semaglutide(0.25 or 0.5MG /DOS) 2 MG/1.5ML Sopn Inject 0.25 mg into the skin once a week.   senna-docusate 8.6-50 MG tablet Commonly known as: Senokot-S Take 2  tablets by mouth at bedtime. What changed: how much to take   sertraline 25 MG tablet Commonly known as: ZOLOFT TAKE 2 TABLETS BY MOUTH EVERY DAY What changed:   how much to take  when to take this   Vitamin D-1000 Max St 25 MCG (1000 UT) tablet Generic drug: Cholecalciferol Take 1 tablet (1,000 Units total) by mouth daily.       Contact information for follow-up providers    Frann Rider, NP. Schedule an appointment as soon as possible for a visit in 4 week(s).   Specialty: Neurology Contact information: [address] [place] [address] [phone]        Chesley Noon, MD. Schedule an appointment as soon as possible for a visit in 1 week(s).   Specialty: Family Medicine Contact information: [place] [address] [phone]            Contact information for after-discharge care    Destination    [facility] Preferred SNF .   Service: Skilled Nursing Contact information: [address] [place] [phone]                 No Known Allergies     Other Procedures/Studies: CT HEAD WO CONTRAST  Result Date: 07/04/2020 CLINICAL DATA:  Mental status changes, generalized weakness, confusion, remote infarcts EXAM: CT HEAD WITHOUT CONTRAST TECHNIQUE: Contiguous axial images were obtained from the base of the skull through the vertex without intravenous contrast. COMPARISON:  07/16/2019 FINDINGS: Brain: Stable atrophy pattern and diffuse bilateral white matter microvascular ischemic changes throughout both cerebral hemispheres. Mild associated ventricular enlargement. Remote bilateral basal ganglia infarcts. No new acute intracranial hemorrhage, definite new infarction, midline shift, herniation, or hydrocephalus. No extra-axial fluid collection. Cisterns are patent. Cerebellar unremarkable and stable in appearance. Vascular: No hyperdense vessel or unexpected calcification. Skull: Normal.  Negative for fracture or focal lesion. Sinuses/Orbits: No acute finding. Other: None. IMPRESSION: Stable brain atrophy pattern and chronic white matter microvascular ischemic changes. Remote basal ganglia lacunar type infarcts. No acute intracranial abnormality or interval change by noncontrast CT. Electronically Signed   By: Jerilynn Mages.  Shick M.D.   On: 07/04/2020 16:43   MR ANGIO HEAD WO CONTRAST  Result Date: 07/05/2020 CLINICAL DATA:  Stroke, follow-up. EXAM: MRA HEAD WITHOUT CONTRAST TECHNIQUE: Angiographic images of the Circle of Willis were acquired using MRA technique without intravenous contrast. COMPARISON:  Brain MRI 07/05/2020. CT angiogram head 06/24/2019. FINDINGS: The examination is limited by significant image noise artifact. Anterior circulation: The intracranial internal carotid arteries are patent. The M1 middle cerebral arteries are patent. Atherosclerotic irregularity of the M2 and more distal middle cerebral  arteries bilaterally. Most notably, there are apparent progressive moderate/severe stenoses at the origins of both the superior and inferior division proximal left M2 branches (series 252, image 7). The anterior cerebral arteries are patent. Sites of progressive moderate/severe stenosis within the A2/A3 anterior cerebral arteries bilaterally. No intracranial aneurysm is identified. Posterior circulation: The intracranial vertebral arteries and very proximal basilar artery are excluded from the field of view. The visualized basilar artery is patent without significant stenosis. 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 The posterior cerebral arteries are patent. Redemonstrated severe stenosis within the P1 left posterior cerebral artery. A sizable left posterior communicating artery is present and patent. Additional atherosclerotic irregularity of the left posterior cerebral artery. Most notably, there is a severe stenosis within a left PCA P4 branch, better appreciated on the  prior CTA of 06/24/2019. Redemonstrated mild/moderate stenosis within the right PCA at the P1/P2 junction. Additional atherosclerotic irregularity of the right PCA. Most notably, a moderate/severe stenosis within a right PCA branch at the P3/P4 junction was better appreciated on the prior CTA. The right posterior communicating artery is hypoplastic or absent. Anatomic variants: As described IMPRESSION: Examination limited by image noise artifact. Additionally, the intracranial vertebral arteries and very proximal basilar artery are excluded from the field of view. Progressive moderate/severe stenoses at the origin of both the superior and inferior division left M2 MCA vessels. Progressive moderate/severe stenoses within the A2/A3 anterior cerebral arteries bilaterally. These apparent stenoses could potentially be exaggerated by image noise artifact on the current exam. Otherwise, no significant interval change as compared to the CTA head of 06/24/2019. Atherosclerotic irregularity of both posterior cerebral arteries, most notably as follows. Severe stenosis within the P1 left posterior cerebral artery (a sizable left posterior communicating artery is present and patent). Severe stenosis within a left PCA P4 branch. Mild/moderate stenosis within the right PCA at the P1/P2 junction. Moderate/severe stenosis within a right PCA branch at the P3/P4 junction. Electronically Signed   By: Kellie Simmering DO   On: 07/05/2020 17:10   MR BRAIN WO CONTRAST  Result Date: 07/05/2020 CLINICAL DATA:  Initial evaluation for neuro deficit, stroke suspected. EXAM: MRI HEAD WITHOUT CONTRAST TECHNIQUE: Multiplanar, multiecho pulse sequences of the brain and surrounding structures were obtained without intravenous contrast. COMPARISON:  Prior CT from 07/04/2020 and MRI from 07/16/2019. FINDINGS: Brain: Diffuse prominence of the CSF containing spaces compatible with generalized age-related cerebral atrophy, advanced in nature. Extensive  patchy and confluent T2/FLAIR hyperintensity within the periventricular and deep white matter both cerebral hemispheres most consistent with chronic small vessel ischemic disease, also advanced. Multiple remote lacunar infarcts present about the bilateral basal ganglia and corona radiata. Associated chronic hemosiderin staining present at a prominent left basal ganglia lacunar infarct. 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 Small remote lacunar infarcts at the right thalamus and left pons. Appearance is relatively stable from previous. 12 mm focus of diffusion abnormality involving the left frontal corona radiata consistent with an early subacute ischemic infarct (series 5, image 80). Associated T2/FLAIR signal abnormality without significant regional mass effect. Possible minimal associated petechial hemorrhage without hemorrhagic transformation. No other evidence for acute or subacute ischemia. Gray-white matter differentiation otherwise maintained. No acute intracranial hemorrhage. No mass lesion, midline shift, or mass effect. Diffuse ventricular prominence related to global parenchymal volume loss without hydrocephalus. Mild ex vacuo dilatation of the left lateral ventricle related to chronic left basal ganglia ischemic change. No extra-axial fluid collection. Pituitary gland suprasellar region within normal  limits. Midline structures intact. Vascular: Major intracranial vascular flow voids are maintained. Skull and upper cervical spine: Craniocervical junction within normal limits. Bone marrow signal intensity normal. No scalp soft tissue abnormality. Sinuses/Orbits: Patient status post ocular lens replacement on the left. Globes and orbital soft tissues demonstrate no acute finding. Scattered mucosal thickening noted throughout the ethmoidal air cells and maxillary sinuses. Trace left mastoid effusion noted, of doubtful significance. Inner ear structures  grossly normal. Other: None. IMPRESSION: 1. 12 mm early subacute ischemic infarct involving the left frontal corona radiata. Possible minimal associated petechial hemorrhage without hemorrhagic transformation or significant mass effect. 2. No other acute intracranial abnormality. 3. Advanced cerebral atrophy with chronic microvascular ischemic disease with multiple remote lacunar infarcts as above. Electronically Signed   By: Rise MuBenjamin  McClintock M.D.   On: 07/05/2020 01:25   DG Chest Portable 1 View  Result Date: 07/04/2020 CLINICAL DATA:  Foreign body MRI clearance. EXAM: PORTABLE CHEST 1 VIEW COMPARISON:  06/24/2019 FINDINGS: Overlying monitoring devices/EKG leads in place. No implanted medical device to preclude MRI imaging. Stable heart size and mediastinal contours. Mild elevation of right hemidiaphragm. No acute airspace disease. No pleural fluid or pneumothorax. No acute osseous abnormalities are seen. IMPRESSION: 1. No implanted medical device to preclude MRI imaging. 2. No acute process in the chest. Electronically Signed   By: Narda RutherfordMelanie  Sanford M.D.   On: 07/04/2020 23:16   DG Abd Portable 1V  Result Date: 07/04/2020 CLINICAL DATA:  Metal screening prior to MRI. EXAM: PORTABLE ABDOMEN - 1 VIEW COMPARISON:  None. FINDINGS: Overlying monitoring devices. There is no implanted medical device to preclude MRI imaging. Right upper quadrant calcification, favor gallstone. Increased air throughout nondilated small bowel. Mild gaseous gastric distension. Bone island in the left iliac bone. No acute osseous abnormalities are seen. IMPRESSION: 1. No implanted medical device to preclude MRI imaging. 2. Right upper quadrant calcification, favor gallstone. 3. Increased air throughout nondilated small bowel may represent an ileus. Electronically Signed   By: Narda RutherfordMelanie  Sanford M.D.   On: 07/04/2020 23:15   DG Swallowing Func-Speech Pathology  Result Date: 07/07/2020 Objective Swallowing Evaluation: Type of  Study: MBS-Modified Barium Swallow Study  Patient Details Name: Cathren LaineFloyd L Cespedes MRN: [id number] Date of Birth: 1940-07-13 Today's Date: 07/07/2020 Time: SLP Start Time (ACUTE ONLY): 1252 -SLP Stop Time (ACUTE ONLY): 1306 SLP Time Calculation (min) (ACUTE ONLY): 14 min Past Medical History: Past Medical History: Diagnosis Date . Brain bleed (HCC)  . Cancer [facility](HCC)   prostate . CHI (closed head injury) 04/2013  with [facility] . Depression  . Diabetes mellitus without complication (HCC)  . High cholesterol  . Hypertension  . Stroke [facility](HCC)  Past Surgical History: Past Surgical History: Procedure Laterality Date . arm & hand surgery Left 1972  skill saw accident . COLON SURGERY    partial . EYE SURGERY    lens implant and cateract- Left . PROSTATECTOMY   HPI: Pt is a 80 y.o. male who presented with worsening right leg weakness.  MRI brain 5/14: 12 mm early subacute ischemic infarct left frontal  corona radiata. Possible minimal associated petechial hemorrhage. 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 CXR 5/13: no acute process in the chest.  BSE 07/17/19 no s/s, 3 oz without difficulty- reg/thin recommended. Outpatient SLP services September-October 2021: overall mild-moderate deficits; SLP felt level of deficit is functionally moderate-severe with sustained/selective attention, memory, awareness, and language impairments. It  was judged that the pt would be unlikely to make progress/achieve carryover without family involvement due to severity of deficits. However, family participation was inadequate despite SLP's efforts.  No data recorded Assessment / Plan / Recommendation CHL IP CLINICAL IMPRESSIONS 07/07/2020 Clinical Impression Pt presents with pharyngeal dysphagia characterized by reduced anterior laryngeal movement and a pharyngeal delay. He demonstrated penetration (PAS 3, 5) and aspiration (PAS 7, 8) with thin liquids via cup and straw and  with nectar thick liquids. With thin liquids via cup, aspiration of penetrated material was trace and occurred after deglutition. Aspiration was most significant with thin liquids via straw. Prompted coughing was effective with trace aspiration, but not with larger quantities via straw. Coughing was ineffective in expelling nectar thick penetrate. Penetration was improved to PAS 2 and aspiration eliminated with individual sips of thin liquids via cup when a chin tuck posture was used. A regular texture diet with thin liquids via cup is recommended at this time with strict observance of swallowing precautions and with full supervision to ensure consistent use of chin tuck posture. SLP will continue to follow pt. SLP Visit Diagnosis Dysphagia, pharyngeal phase (R13.13) Attention and concentration deficit following -- Frontal lobe and executive function deficit following -- Impact on safety and function Mild aspiration risk   CHL IP TREATMENT RECOMMENDATION 07/07/2020 Treatment Recommendations Therapy as outlined in treatment plan below   Prognosis 07/07/2020 Prognosis for Safe Diet Advancement Good Barriers to Reach Goals Cognitive deficits Barriers/Prognosis Comment -- CHL IP DIET RECOMMENDATION 07/07/2020 SLP Diet Recommendations Regular solids;Thin liquid Liquid Administration via Cup;No straw Medication Administration Whole meds with puree Compensations Slow rate;Small sips/bites;Chin tuck Postural Changes Seated upright at 90 degrees   No flowsheet data found.  CHL IP FOLLOW UP RECOMMENDATIONS 07/07/2020 Follow up Recommendations Skilled Nursing facility   [facility] IP FREQUENCY AND DURATION 07/07/2020 Speech Therapy Frequency (ACUTE ONLY) min 2x/week Treatment Duration 2 weeks      CHL IP ORAL PHASE 07/07/2020 Oral Phase WFL Oral - Pudding Teaspoon -- Oral - Pudding Cup -- Oral - Honey Teaspoon -- Oral - Honey Cup -- Oral - Nectar Teaspoon -- Oral - Nectar Cup -- Oral - Nectar Straw -- Oral - Thin Teaspoon -- Oral - Thin  Cup -- Oral - Thin Straw -- Oral - Puree -- Oral - Mech Soft -- Oral - Regular -- Oral - Multi-Consistency -- Oral - Pill -- Oral Phase - Comment --  CHL IP PHARYNGEAL PHASE 07/07/2020 Pharyngeal Phase Impaired Pharyngeal- Pudding Teaspoon -- Pharyngeal -- Pharyngeal- Pudding Cup -- Pharyngeal -- Pharyngeal- Honey Teaspoon -- Pharyngeal -- Pharyngeal- Honey Cup -- Pharyngeal -- Pharyngeal- Nectar Teaspoon -- Pharyngeal -- Pharyngeal- Nectar Cup Reduced anterior laryngeal mobility;Delayed swallow initiation-pyriform sinuses;Penetration/Aspiration during swallow Pharyngeal Material enters airway, remains ABOVE vocal cords and not ejected out;Material enters airway, CONTACTS cords and not ejected out Pharyngeal- Nectar Straw Reduced anterior laryngeal mobility;Delayed swallow initiation-pyriform sinuses;Penetration/Aspiration during swallow Pharyngeal Material enters airway, remains ABOVE vocal cords and not ejected out;Material enters airway, CONTACTS cords and not ejected out;Material enters airway, passes BELOW cords and not ejected out despite cough attempt by patient Pharyngeal- Thin Teaspoon -- Pharyngeal -- Pharyngeal- Thin Cup Reduced anterior laryngeal mobility;Delayed swallow initiation-pyriform sinuses;Penetration/Aspiration during swallow Pharyngeal Material enters airway, remains ABOVE vocal cords and not ejected out;Material enters airway, CONTACTS cords and not ejected out;Material enters airway, passes BELOW cords without attempt by patient to eject out (silent aspiration) Pharyngeal- Thin Straw Reduced anterior laryngeal mobility;Delayed swallow initiation-pyriform sinuses;Penetration/Aspiration during swallow;Penetration/Apiration after swallow  Pharyngeal Material enters airway, passes BELOW cords and not ejected out despite cough attempt by patient Pharyngeal- Puree Reduced anterior laryngeal mobility;Delayed swallow initiation-pyriform sinuses Pharyngeal -- Pharyngeal- Mechanical Soft -- Pharyngeal  -- Pharyngeal- Regular Reduced anterior laryngeal mobility;Delayed swallow initiation-pyriform sinuses Pharyngeal -- Pharyngeal- Multi-consistency -- Pharyngeal -- Pharyngeal- Pill Reduced anterior laryngeal mobility;Delayed swallow initiation-pyriform sinuses Pharyngeal -- Pharyngeal Comment --  CHL IP CERVICAL ESOPHAGEAL PHASE 07/07/2020 Cervical Esophageal Phase WFL Pudding Teaspoon -- Pudding Cup -- Honey Teaspoon -- Honey Cup -- Nectar Teaspoon -- Nectar Cup -- Nectar Straw -- Thin Teaspoon -- Thin Cup -- Thin Straw -- Puree -- Mechanical Soft -- Regular -- Multi-consistency -- Pill -- Cervical Esophageal Comment -- Shanika I. Hardin Negus, [place], [place] Office number [phone] Pager [phone] Horton Marshall 07/07/2020, 2:55 PM              ECHOCARDIOGRAM COMPLETE  Result Date: 07/06/2020    ECHOCARDIOGRAM REPORT   Patient Name:   KERSHAW SPHAR Date of Exam: 07/06/2020 Medical Rec #:  [id number]     Height:       71.0 in Accession #:    [id number]    Weight:       214.7 lb Date of Birth:  27-Mar-1940    BSA:          2.173 m Patient Age:    40 years      BP:           151/69 mmHg Patient Gender: M             HR:           63 bpm. Exam Location:  Inpatient Procedure: 2D Echo, Cardiac Doppler and Color Doppler Indications:    CHF-Acute Diastolic [id number]  History:        Patient has prior history of Echocardiogram examinations, most                 recent 06/25/2019.  Sonographer:    Merrie Roof Referring Phys: [id number] [place]  1. Left ventricular ejection fraction, by estimation, is 60 to 65%. The left ventricle has normal function. The left ventricle has no regional wall motion abnormalities. There is mild asymmetric left ventricular hypertrophy of the septal segment. Left ventricular diastolic parameters are consistent with Grade I diastolic dysfunction (impaired relaxation).  2. Right ventricular systolic function is normal. The right ventricular size is  mildly enlarged. Tricuspid regurgitation signal is inadequate for assessing PA pressure.  3. The mitral valve is normal in structure. No evidence of mitral valve regurgitation. No evidence of mitral stenosis.  4. The aortic valve is tricuspid. There is moderate calcification of the aortic valve. There is moderate thickening of the aortic valve. Aortic valve regurgitation is trivial. Mild to moderate aortic valve sclerosis/calcification is present, without any  evidence of aortic stenosis. Aortic valve area, by VTI measures 2.73 cm. Aortic valve mean gradient measures 4.0 mmHg. Aortic valve Vmax measures 1.46 m/s. FINDINGS  Left Ventricle: Left ventricular ejection fraction, by estimation, is 60 to 65%. The left ventricle has normal function. The left ventricle has no regional wall motion abnormalities. The left ventricular internal cavity size was normal in size. There is  mild asymmetric left ventricular hypertrophy of the septal segment. Left ventricular diastolic parameters are consistent with Grade I diastolic dysfunction (impaired relaxation). Normal left ventricular filling pressure. Right Ventricle: The right ventricular size is mildly enlarged. No increase in right ventricular wall thickness. Right ventricular  systolic function is normal. Tricuspid regurgitation signal is inadequate for assessing PA pressure. Left Atrium: Left atrial size was normal in size. Right Atrium: Right atrial size was normal in size. Pericardium: There is no evidence of pericardial effusion. Mitral Valve: The mitral valve is normal in structure. Mild mitral annular calcification. No evidence of mitral valve regurgitation. No evidence of mitral valve stenosis. Tricuspid Valve: The tricuspid valve is normal in structure. Tricuspid valve regurgitation is not demonstrated. No evidence of tricuspid stenosis. Aortic Valve: The aortic valve is tricuspid. There is moderate calcification of the aortic valve. There is moderate thickening of  the aortic valve. Aortic valve regurgitation is trivial. Mild to moderate aortic valve sclerosis/calcification is present, without any evidence of aortic stenosis. Aortic valve mean gradient measures 4.0 mmHg. Aortic valve peak gradient measures 8.5 mmHg. Aortic valve area, by VTI measures 2.73 cm. Pulmonic Valve: The pulmonic valve was normal in structure. Pulmonic valve regurgitation is not visualized. No evidence of pulmonic stenosis. Aorta: The aortic root is normal in size and structure. IAS/Shunts: No atrial level shunt detected by color flow Doppler.  LEFT VENTRICLE PLAX 2D LVIDd:         4.00 cm  Diastology LVIDs:         2.40 cm  LV e' medial:    7.72 cm/s LV PW:         1.10 cm  LV E/e' medial:  10.8 LV IVS:        1.40 cm  LV e' lateral:   5.98 cm/s LVOT diam:     2.00 cm  LV E/e' lateral: 13.9 LV SV:         79 LV SV Index:   36 LVOT Area:     3.14 cm  RIGHT VENTRICLE RV Basal diam:  4.10 cm RV Mid diam:    2.80 cm LEFT ATRIUM           Index       RIGHT ATRIUM           Index LA diam:      2.30 cm 1.06 cm/m  RA Area:     12.10 cm LA Vol (A2C): 57.8 ml 26.60 ml/m RA Volume:   24.20 ml  11.14 ml/m LA Vol (A4C): 62.5 ml 28.76 ml/m  AORTIC VALVE AV Area (Vmax):    2.71 cm AV Area (Vmean):   2.65 cm AV Area (VTI):     2.73 cm AV Vmax:           146.00 cm/s AV Vmean:          97.100 cm/s AV VTI:            0.289 m AV Peak Grad:      8.5 mmHg AV Mean Grad:      4.0 mmHg LVOT Vmax:         126.00 cm/s LVOT Vmean:        81.900 cm/s LVOT VTI:          0.251 m LVOT/AV VTI ratio: 0.87  AORTA Ao Root diam: 3.70 cm Ao Asc diam:  3.10 cm MITRAL VALVE MV Area (PHT): 3.76 cm     SHUNTS MV Decel Time: 202 msec     Systemic VTI:  0.25 m MV E velocity: 83.10 cm/s   Systemic Diam: 2.00 cm MV A velocity: 102.00 cm/s MV E/A ratio:  0.81 Fransico Him MD Electronically signed by Fransico Him MD Signature Date/Time: 07/06/2020/12:26:30 PM    Final  VAS US CAROTID (at [facility] and [facility] only)  Result Date:  07/09/2020 Carotid Arterial Duplex Study Patient Name:  HESSTON DOSCH  Date of Exam:   07/05/2020 Medical Rec #: [id number]      Accession #:    [id number] Date of Birth: 01/20/1941     Patient Gender: M Patient Age:   79Y Exam Location:  [facility] Procedure:      VAS US CAROTID Referring Phys: [id number] [place] --------------------------------------------------------------------------------  Indications:       CVA, Speech disturbance and Weakness. Risk Factors:      Hypertension, hyperlipidemia, Diabetes. Other Factors:     Vascular dementia. Comparison Study:  Prior carotid duplex indicating 1-39% ICA stenosis, done                    06/25/19 Performing Technologist: Sharion Dove RVS  Examination Guidelines: A complete evaluation includes B-mode imaging, spectral Doppler, color Doppler, and power Doppler as needed of all accessible portions of each vessel. Bilateral testing is considered an integral part of a complete examination. Limited examinations for reoccurring indications may be performed as noted.  Right Carotid Findings: +----------+--------+--------+--------+----------------------+--------+           PSV cm/sEDV cm/sStenosisPlaque Description    Comments +----------+--------+--------+--------+----------------------+--------+ CCA Prox  67      7                                              +----------+--------+--------+--------+----------------------+--------+ CCA Distal52      11                                             +----------+--------+--------+--------+----------------------+--------+ ICA Prox  57      15              irregular and calcific         +----------+--------+--------+--------+----------------------+--------+ ICA Mid   45      13                                             +----------+--------+--------+--------+----------------------+--------+ ICA Distal78      19                                              +----------+--------+--------+--------+----------------------+--------+ ECA       125     15                                             +----------+--------+--------+--------+----------------------+--------+ +----------+--------+-------+--------+-------------------+           PSV cm/sEDV cmsDescribeArm Pressure (mmHG) +----------+--------+-------+--------+-------------------+ [id number]                                         +----------+--------+-------+--------+-------------------+ +---------+--------+--+--------+-+ VertebralPSV cm/s42EDV cm/s5 +---------+--------+--+--------+-+  Left Carotid Findings: +----------+--------+--------+--------+------------------+------------------+  PSV cm/sEDV cm/sStenosisPlaque DescriptionComments           +----------+--------+--------+--------+------------------+------------------+ CCA Prox  125     12                                                   +----------+--------+--------+--------+------------------+------------------+ CCA Distal72      12                                intimal thickening +----------+--------+--------+--------+------------------+------------------+ ICA Prox  64      13              calcific                             +----------+--------+--------+--------+------------------+------------------+ ICA Distal65      18                                                   +----------+--------+--------+--------+------------------+------------------+ ECA       120     14                                                   +----------+--------+--------+--------+------------------+------------------+ +----------+--------+--------+--------+-------------------+           PSV cm/sEDV cm/sDescribeArm Pressure (mmHG) +----------+--------+--------+--------+-------------------+ [id number]      4                                    +----------+--------+--------+--------+-------------------+ +---------+--------+--+--------+-+ VertebralPSV cm/s26EDV cm/s4 +---------+--------+--+--------+-+   Summary: Right Carotid: Velocities in the right ICA are consistent with a 1-39% stenosis. Left Carotid: Velocities in the left ICA are consistent with a 1-39% stenosis. Vertebrals:  Bilateral vertebral arteries demonstrate antegrade flow. Subclavians: Normal flow hemodynamics were seen in bilateral subclavian              arteries. *See table(s) above for measurements and observations.  Electronically signed by Antony Contras MD on 07/09/2020 at 8:23:59 AM.    Final      TODAY-DAY OF DISCHARGE:  Subjective:   Joen Laura today has no headache,no chest abdominal pain,no new weakness tingling or numbness, feels much better wants to go home today.  Objective:   Blood pressure (!) 165/82, pulse 73, temperature 98.1 F (36.7 C), temperature source Oral, resp. rate 20, height 5\' 11"  (1.803 m), weight 97.4 kg, SpO2 98 %.  Intake/Output Summary (Last 24 hours) at 07/09/2020 0953 Last data filed at 07/09/2020 0459 Gross per 24 hour  Intake 360 ml  Output 1550 ml  Net -1190 ml   Filed Weights   07/04/20 1535 07/05/20 0447 07/06/20 0603  Weight: 90.7 kg 97 kg 97.4 kg    Exam: Awake Alert, Oriented *3, No new F.N deficits, Normal affect [place].AT,PERRAL Supple Neck,No JVD, No cervical lymphadenopathy appriciated.  Symmetrical Chest wall movement, Good air movement bilaterally, CTAB RRR,No Gallops,Rubs or new Murmurs, No Parasternal Heave +ve B.Sounds,  Abd Soft, Non tender, No organomegaly appriciated, No rebound -guarding or rigidity. No Cyanosis, Clubbing or edema, No new Rash or bruise   PERTINENT RADIOLOGIC STUDIES: DG Swallowing Func-Speech Pathology  Result Date: 07/07/2020 Objective Swallowing Evaluation: Type of Study: MBS-Modified Barium Swallow Study  Patient Details Name: PRATHER LINENBERGER MRN: [id number] Date of Birth:  1940-08-09 Today's Date: 07/07/2020 Time: SLP Start Time (ACUTE ONLY): 1252 -SLP Stop Time (ACUTE ONLY): 1306 SLP Time Calculation (min) (ACUTE ONLY): 14 min Past Medical History: Past Medical History: Diagnosis Date . Brain bleed (HCC)  . Cancer [facility])   prostate . CHI (closed head injury) 04/2013  with [facility] . Depression  . Diabetes mellitus without complication (HCC)  . High cholesterol  . Hypertension  . Stroke [facility])  Past Surgical History: Past Surgical History: Procedure Laterality Date . arm & hand surgery Left 1972  skill saw accident . COLON SURGERY    partial . EYE SURGERY    lens implant and cateract- Left . PROSTATECTOMY   HPI: Pt is a 80 y.o. male who presented with worsening right leg weakness.  MRI brain 5/14: 12 mm early subacute ischemic infarct left frontal  corona radiata. Possible minimal associated petechial hemorrhage. 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 CXR 5/13: no acute process in the chest.  BSE 07/17/19 no s/s, 3 oz without difficulty- reg/thin recommended. Outpatient SLP services September-October 2021: overall mild-moderate deficits; SLP felt level of deficit is functionally moderate-severe with sustained/selective attention, memory, awareness, and language impairments. It was judged that the pt would be unlikely to make progress/achieve carryover without family involvement due to severity of deficits. However, family participation was inadequate despite SLP's efforts.  No data recorded Assessment / Plan / Recommendation CHL IP CLINICAL IMPRESSIONS 07/07/2020 Clinical Impression Pt presents with pharyngeal dysphagia characterized by reduced anterior laryngeal movement and a pharyngeal delay. He demonstrated penetration (PAS 3, 5) and aspiration (PAS 7, 8) with thin liquids via cup and straw and with nectar thick liquids. With thin liquids via cup, aspiration of penetrated material was trace and  occurred after deglutition. Aspiration was most significant with thin liquids via straw. Prompted coughing was effective with trace aspiration, but not with larger quantities via straw. Coughing was ineffective in expelling nectar thick penetrate. Penetration was improved to PAS 2 and aspiration eliminated with individual sips of thin liquids via cup when a chin tuck posture was used. A regular texture diet with thin liquids via cup is recommended at this time with strict observance of swallowing precautions and with full supervision to ensure consistent use of chin tuck posture. SLP will continue to follow pt. SLP Visit Diagnosis Dysphagia, pharyngeal phase (R13.13) Attention and concentration deficit following -- Frontal lobe and executive function deficit following -- Impact on safety and function Mild aspiration risk   CHL IP TREATMENT RECOMMENDATION 07/07/2020 Treatment Recommendations Therapy as outlined in treatment plan below   Prognosis 07/07/2020 Prognosis for Safe Diet Advancement Good Barriers to Reach Goals Cognitive deficits Barriers/Prognosis Comment -- CHL IP DIET RECOMMENDATION 07/07/2020 SLP Diet Recommendations Regular solids;Thin liquid Liquid Administration via Cup;No straw Medication Administration Whole meds with puree Compensations Slow rate;Small sips/bites;Chin tuck Postural Changes Seated upright at 90 degrees   No flowsheet data found.  CHL IP FOLLOW UP RECOMMENDATIONS 07/07/2020 Follow up Recommendations Skilled Nursing facility   [facility] IP FREQUENCY AND DURATION 07/07/2020 Speech Therapy Frequency (ACUTE ONLY) min 2x/week Treatment Duration 2  weeks      CHL IP ORAL PHASE 07/07/2020 Oral Phase WFL Oral - Pudding Teaspoon -- Oral - Pudding Cup -- Oral - Honey Teaspoon -- Oral - Honey Cup -- Oral - Nectar Teaspoon -- Oral - Nectar Cup -- Oral - Nectar Straw -- Oral - Thin Teaspoon -- Oral - Thin Cup -- Oral - Thin Straw -- Oral - Puree -- Oral - Mech Soft -- Oral - Regular -- Oral -  Multi-Consistency -- Oral - Pill -- Oral Phase - Comment --  CHL IP PHARYNGEAL PHASE 07/07/2020 Pharyngeal Phase Impaired Pharyngeal- Pudding Teaspoon -- Pharyngeal -- Pharyngeal- Pudding Cup -- Pharyngeal -- Pharyngeal- Honey Teaspoon -- Pharyngeal -- Pharyngeal- Honey Cup -- Pharyngeal -- Pharyngeal- Nectar Teaspoon -- Pharyngeal -- Pharyngeal- Nectar Cup Reduced anterior laryngeal mobility;Delayed swallow initiation-pyriform sinuses;Penetration/Aspiration during swallow Pharyngeal Material enters airway, remains ABOVE vocal cords and not ejected out;Material enters airway, CONTACTS cords and not ejected out Pharyngeal- Nectar Straw Reduced anterior laryngeal mobility;Delayed swallow initiation-pyriform sinuses;Penetration/Aspiration during swallow Pharyngeal Material enters airway, remains ABOVE vocal cords and not ejected out;Material enters airway, CONTACTS cords and not ejected out;Material enters airway, passes BELOW cords and not ejected out despite cough attempt by patient Pharyngeal- Thin Teaspoon -- Pharyngeal -- Pharyngeal- Thin Cup Reduced anterior laryngeal mobility;Delayed swallow initiation-pyriform sinuses;Penetration/Aspiration during swallow Pharyngeal Material enters airway, remains ABOVE vocal cords and not ejected out;Material enters airway, CONTACTS cords and not ejected out;Material enters airway, passes BELOW cords without attempt by patient to eject out (silent aspiration) Pharyngeal- Thin Straw Reduced anterior laryngeal mobility;Delayed swallow initiation-pyriform sinuses;Penetration/Aspiration during swallow;Penetration/Apiration after swallow Pharyngeal Material enters airway, passes BELOW cords and not ejected out despite cough attempt by patient Pharyngeal- Puree Reduced anterior laryngeal mobility;Delayed swallow initiation-pyriform sinuses Pharyngeal -- Pharyngeal- Mechanical Soft -- Pharyngeal -- Pharyngeal- Regular Reduced anterior laryngeal mobility;Delayed swallow  initiation-pyriform sinuses Pharyngeal -- Pharyngeal- Multi-consistency -- Pharyngeal -- Pharyngeal- Pill Reduced anterior laryngeal mobility;Delayed swallow initiation-pyriform sinuses Pharyngeal -- Pharyngeal Comment --  CHL IP CERVICAL ESOPHAGEAL PHASE 07/07/2020 Cervical Esophageal Phase WFL Pudding Teaspoon -- Pudding Cup -- Honey Teaspoon -- Honey Cup -- Nectar Teaspoon -- Nectar Cup -- Nectar Straw -- Thin Teaspoon -- Thin Cup -- Thin Straw -- Puree -- Mechanical Soft -- Regular -- Multi-consistency -- Pill -- Cervical Esophageal Comment -- Shanika I. Hardin Negus, [place], [place] Office number [phone] Pager [phone] Horton Marshall 07/07/2020, 2:55 PM                PERTINENT LAB RESULTS: CBC: No results for input(s): WBC, HGB, HCT, PLT in the last 72 hours. CMET CMP     Component Value Date/Time   NA 138 07/06/2020 0125   K 4.0 07/06/2020 0125   CL 106 07/06/2020 0125   CO2 27 07/06/2020 0125   GLUCOSE 116 (H) 07/06/2020 0125   BUN 12 07/06/2020 0125   CREATININE 1.06 07/06/2020 0125   CALCIUM 8.9 07/06/2020 0125   PROT 5.5 (L) 07/05/2020 0222   ALBUMIN 3.2 (L) 07/05/2020 0222   AST 16 07/05/2020 0222   ALT 15 07/05/2020 0222   ALKPHOS 21 (L) 07/05/2020 0222   BILITOT 0.5 07/05/2020 0222   GFRNONAA >60 07/06/2020 0125   GFRAA >60 08/02/2019 0647    GFR Estimated Creatinine Clearance: 69.4 mL/min (by C-G formula based on SCr of 1.06 mg/dL). No results for input(s): LIPASE, AMYLASE in the last 72 hours. No results for input(s): CKTOTAL, CKMB, CKMBINDEX, TROPONINI in the last 72 hours. Invalid input(s): POCBNP No  results for input(s): DDIMER in the last 72 hours. No results for input(s): HGBA1C in the last 72 hours. No results for input(s): CHOL, HDL, LDLCALC, TRIG, CHOLHDL, LDLDIRECT in the last 72 hours. No results for input(s): TSH, T4TOTAL, T3FREE, THYROIDAB in the last 72 hours.  Invalid input(s): FREET3 No results for input(s):  VITAMINB12, FOLATE, FERRITIN, TIBC, IRON, RETICCTPCT in the last 72 hours. Coags: No results for input(s): INR in the last 72 hours.  Invalid input(s): PT Microbiology: Recent Results (from the past 240 hour(s))  Resp Panel by RT-PCR (Flu A&B, Covid) Nasopharyngeal Swab     Status: None   Collection Time: 07/04/20  6:15 PM   Specimen: Nasopharyngeal Swab; Nasopharyngeal(NP) swabs in vial transport medium  Result Value Ref Range Status   SARS Coronavirus 2 by RT PCR NEGATIVE NEGATIVE Final    Comment: (NOTE) SARS-CoV-2 target nucleic acids are NOT DETECTED.  The SARS-CoV-2 RNA is generally detectable in upper respiratory specimens during the acute phase of infection. The lowest concentration of SARS-CoV-2 viral copies this assay can detect is 138 copies/mL. A negative result does not preclude SARS-Cov-2 infection and should not be used as the sole basis for treatment or other patient management decisions. A negative result may occur with  improper specimen collection/handling, submission of specimen other than nasopharyngeal swab, presence of viral mutation(s) within the areas targeted by this assay, and inadequate number of viral copies(<138 copies/mL). A negative result must be combined with clinical observations, patient history, and epidemiological information. The expected result is Negative.  Fact Sheet for Patients:  EntrepreneurPulse.com.au  Fact Sheet for Healthcare Providers:  IncredibleEmployment.be  This test is no t yet approved or cleared by the [place] FDA and  has been authorized for detection and/or diagnosis of SARS-CoV-2 by FDA under an Emergency Use Authorization (EUA). This EUA will remain  in effect (meaning this test can be used) for the duration of the COVID-19 declaration under Section 564(b)(1) of the Act, 21 U.S.C.section 360bbb-3(b)(1), unless the authorization is terminated  or revoked sooner.        Influenza A by PCR NEGATIVE NEGATIVE Final   Influenza B by PCR NEGATIVE NEGATIVE Final    Comment: (NOTE) The Xpert Xpress SARS-CoV-2/FLU/RSV plus assay is intended as an aid in the diagnosis of influenza from Nasopharyngeal swab specimens and should not be used as a sole basis for treatment. Nasal washings and aspirates are unacceptable for Xpert Xpress SARS-CoV-2/FLU/RSV testing.  Fact Sheet for Patients: EntrepreneurPulse.com.au  Fact Sheet for Healthcare Providers: IncredibleEmployment.be  This test is not yet approved or cleared by the [place] FDA and has been authorized for detection and/or diagnosis of SARS-CoV-2 by FDA under an Emergency Use Authorization (EUA). This EUA will remain in effect (meaning this test can be used) for the duration of the COVID-19 declaration under Section 564(b)(1) of the Act, 21 U.S.C. section 360bbb-3(b)(1), unless the authorization is terminated or revoked.  Performed at [facility], [place] [address] [place], [place] [address]   SARS CORONAVIRUS 2 (TAT 6-24 HRS) Nasopharyngeal Nasopharyngeal Swab     Status: None   Collection Time: 07/08/20  1:39 PM   Specimen: Nasopharyngeal Swab  Result Value Ref Range Status   SARS Coronavirus 2 NEGATIVE NEGATIVE Final    Comment: (NOTE) SARS-CoV-2 target nucleic acids are NOT DETECTED.  The SARS-CoV-2 RNA is generally detectable in upper and lower respiratory specimens during the acute phase of infection. Negative results do not preclude SARS-CoV-2 infection, do not rule  out co-infections with other pathogens, and should not be used as the sole basis for treatment or other patient management decisions. Negative results must be combined with clinical observations, patient history, and epidemiological information. The expected result is Negative.  Fact Sheet for Patients: SugarRoll.be  Fact Sheet for Healthcare  Providers: https://www.woods-mathews.com/  This test is not yet approved or cleared by the [place] FDA and  has been authorized for detection and/or diagnosis of SARS-CoV-2 by FDA under an Emergency Use Authorization (EUA). This EUA will remain  in effect (meaning this test can be used) for the duration of the COVID-19 declaration under Se ction 564(b)(1) of the Act, 21 U.S.C. section 360bbb-3(b)(1), unless the authorization is terminated or revoked sooner.  Performed at [facility], [place] [address], [place], [place] [address]     FURTHER DISCHARGE INSTRUCTIONS:  Get Medicines reviewed and adjusted: Please take all your medications with you for your next visit with your Primary MD  Laboratory/radiological data: Please request your Primary MD to go over all hospital tests and procedure/radiological results at the follow up, please ask your Primary MD to get all Hospital records sent to his/her office.  In some cases, they will be blood work, cultures and biopsy results pending at the time of your discharge. Please request that your primary care M.D. goes through all the records of your hospital data and follows up on these results.  Also Note the following: If you experience worsening of your admission symptoms, develop shortness of breath, life threatening emergency, suicidal or homicidal thoughts you must seek medical attention immediately by calling 911 or calling your MD immediately  if symptoms less severe.  You must read complete instructions/literature along with all the possible adverse reactions/side effects for all the Medicines you take and that have been prescribed to you. Take any new Medicines after you have completely understood and accpet all the possible adverse reactions/side effects.   Do not drive when taking Pain medications or sleeping medications (Benzodaizepines)  Do not take more than prescribed Pain, Sleep and Anxiety Medications. It is  not advisable to combine anxiety,sleep and pain medications without talking with your primary care practitioner  Special Instructions: If you have smoked or chewed Tobacco  in the last 2 yrs please stop smoking, stop any regular Alcohol  and or any Recreational drug use.  Wear Seat belts while driving.  Please note: You were cared for by a hospitalist during your hospital stay. Once you are discharged, your primary care physician will handle any further medical issues. Please note that NO REFILLS for any discharge medications will be authorized once you are discharged, as it is imperative that you return to your primary care physician (or establish a relationship with a primary care physician if you do not have one) for your post hospital discharge needs so that they can reassess your need for medications and monitor your lab values.  Total Time spent coordinating discharge including counseling, education and face to face time equals 35 minutes.  SignedOren Binet 07/09/2020 9:53 AM

## 2020-07-09 NOTE — Progress Notes (Signed)
Attempted twice to call report to facility. No one is picking up. Unable to leave voicemail.

## 2020-08-01 ENCOUNTER — Telehealth: Payer: Self-pay

## 2020-08-01 NOTE — Telephone Encounter (Signed)
Attempted to contact patient's [name] to schedule a Palliative Care consult appointment. No answer and unable to leave a message. Voicemail is full.

## 2020-08-07 ENCOUNTER — Telehealth: Payer: Self-pay

## 2020-08-07 NOTE — Telephone Encounter (Signed)
Spoke with patient's [name] and scheduled an in-person Palliative Consult for 09/16/20 @ 12:30PM. She requested a afternoon appointment.   COVID screening was negative. No pets in home. Patient lives with   Consent obtained; updated Outlook/Netsmart/Team List and Epic.   Family is aware they may be receiving a call from NP the day before or day of to confirm appointment.

## 2020-08-19 ENCOUNTER — Encounter: Payer: Self-pay | Admitting: Adult Health

## 2020-08-19 ENCOUNTER — Ambulatory Visit: Payer: Medicare HMO | Admitting: Adult Health

## 2020-08-19 VITALS — BP 137/83 | HR 74

## 2020-08-19 DIAGNOSIS — G4733 Obstructive sleep apnea (adult) (pediatric): Secondary | ICD-10-CM

## 2020-08-19 DIAGNOSIS — Z8673 Personal history of transient ischemic attack (TIA), and cerebral infarction without residual deficits: Secondary | ICD-10-CM

## 2020-08-19 DIAGNOSIS — F0151 Vascular dementia with behavioral disturbance: Secondary | ICD-10-CM

## 2020-08-19 DIAGNOSIS — F01518 Vascular dementia, unspecified severity, with other behavioral disturbance: Secondary | ICD-10-CM

## 2020-08-19 MED ORDER — SERTRALINE HCL 100 MG PO TABS: 100.0000 mg | ORAL_TABLET | Freq: Every day | ORAL | 3 refills | Status: AC

## 2020-08-19 MED ORDER — QUETIAPINE FUMARATE 25 MG PO TABS: 12.5000 mg | ORAL_TABLET | Freq: Every day | ORAL | 3 refills | Status: AC

## 2020-08-19 NOTE — Patient Instructions (Addendum)
Continue aspirin 325 mg daily and clopidogrel 75 mg daily  and atorvastatin  for secondary stroke prevention  Continue aspirin til mid August and then just plavix alone  Restart sertaline 100mg  nightly and start seroquel 12.5mg  nightly  Continue to follow up with PCP regarding cholesterol, blood pressure and diabetes management  Maintain strict control of hypertension with blood pressure goal below 130/90, diabetes with hemoglobin A1c goal below 7% and cholesterol with LDL cholesterol (bad cholesterol) goal below 70 mg/dL.   Continue working with home health therapies for hopeful improvement since recent stroke      Followup in the future with me in 3 months or call earlier if needed       Thank you for coming to see [place] at [facility]. I hope we have been able to provide you high quality care today.  You may receive a patient satisfaction survey over the next few weeks. We would appreciate your feedback and comments so that we may continue to improve ourselves and the health of our patients.

## 2020-08-19 NOTE — Progress Notes (Signed)
[facility] [address] [place]. [place] [address] [phone]       OFFICE FOLLOW UP NOTE  [name] Date of Birth:  05/22/1940 Medical Record Number:  [id number]   [facility] stroke provider: Dr. Arvin Collard [name] Reason for Referral: stroke follow up     CHIEF COMPLAINT:  Chief Complaint  Patient presents with   Follow-up    TR with spouse judy Pt has had 2 strokes since last visit, everything else is still the same      HPI:  Stroke admission 10/03/2018: [name] is a 80 y.o. male with history of hypertension, hyperlipidemia, diabetes, prostate cancer, TBI, OSA on CPAP and stroke 1 year ago  presented to [facility] ED on 10/03/2018 for confusion, slurred speech.  Dr. Erlinda Hong and stroke team consulted with stroke work-up showing left CR infarct as evidenced on MRI likely due to small vessel disease source with resultant slight dysarthria.  No tPA given due to outside window.  MRI also showed evidence of chronic left basal ganglia encephalomalacia.  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  2D echo unremarkable.  LDL 48 and A1c 6.9.  Recommended DAPT for 3 months and Plavix alone given left M2 high-grade stenosis.  Previously on Plavix but admitted noncompliance for several days prior.  HTN stable.  Controlled DM with satisfactory A1c.  HLD stable.  Other stroke risk factors include advanced age and OSA on CPAP (new diagnosis in 02/2018).  Discharged home in stable condition with recommendation of outpatient therapies.  Initial visit 11/07/2018: Justin [name] is being seen today for stroke follow-up.  He was last seen in this office for stroke follow-up on 04/06/2018 for follow-up regarding prior stroke with residual cognitive deficits.  Since recent stroke, residual deficits of ongoing cognitive deficits but wife denies worsening. No residual speech difficulties.  He has also been having ongoing  difficulties with ambulation and dragging of right foot which has been present since prior stroke.  Home Speech therapy will be completed at the end of this week. He will also be completing home PT/OT soon. Per wife, noncompliance of medications for 2 months prior to recent stroke.  He does endorse compliance with current medication regimen.  He continues on aspirin 325mg  daily and Plavix without bleeding or bruising. Continues on atorvastatin without myalgias. .  Blood pressure today 139/73.  He subjectively endorses compliance with CPAP for OSA management but wife states he has not been great with ongoing compliance.  He is initially having difficulties with worsening mood and irritability but PCP increased citalopram dose from 10 mg to 20 mg with improvement.  No further concerns at this time.  Update 02/07/2019: Justin [name] is a 80 year old male who is being seen today for stroke and CPAP follow-up. Stroke - residual deficits include cognitive impairment which has been stable without worsening.  MMSE today 21/30.  Wife does endorse increased emotional outbursts or irritability/agitation over the past couple months.  He is currently on Celexa 10 mg daily initially working well for her overall mood and irritability but wife is questioning possible change in therapy.  Continues on Plavix and aspirin despite 3 months recommendation without bleeding or bruising.  Continues on atorvastatin without myalgias.  Blood pressure today 158/78 but does monitor at home and typically lower.  Denies new or worsening stroke/TIA symptoms.  OSA on CPAP -compliance report from 01/07/2019 -02/05/2019 shows 27 out of  30 usage days for 90% compliance and 76.7% of days with usage greater than 4 hours with average usage 5 hours and 28 minutes.  Residual AHI 2.3 on a set pressure of 11 cm H2O with EPR level 2.  He has been tolerating CPAP well without difficulty.  Continues to receive supplies and follow-up with aero care as needed.   He does endorse improvement of overall fatigue and energy level.     Update 09/17/2019 Dr. Leonie Man: Justin [name] is a 80 year old Caucasian male seen today for initial office follow-up visit from hospital admissions for stroke in May 2021.  Is accompanied by his wife.  His history is obtained from them, review of electronic medical records and I personally reviewed imaging films in PACS.  He has past medical history of hypertension, diabetes, hyperlipidemia, depression, intracranial hemorrhage and prior strokes who was admitted recently twice to [facility] on 06/24/2019 initially with 2 small acute infarcts in the right MCA in the centrum semiovale as well as posterior corona radiata.  Patient refused to go to inpatient rehab and returned home as he did well and had no deficits.  He returned back to the hospital on 07/16/2019 with new right leg weakness.  MRI at this time showed a left corona radiator centrum semiovale infarct with expected evolution of previous recent infarcts.  In the prior admissions he had work-up for cryptogenic stroke which included a TEE as well as a loop recorder which negative for paroxysmal A. fib.  LDL cholesterol was 48 mg percent.  Hemoglobin A1c was 7.2.  Echocardiogram showed normal ejection fraction.  He was discharged on dual antiplatelet therapy of aspirin and Plavix as MRI of the brain and neck had revealed multifocal intracranial atherosclerotic changes.  Patient is currently at home getting home physical and occupational therapy but the wife feels he is depressed and walks very little only with the therapist.  He can use a walker but drags his right leg.  His stamina is poor and tires easily after walking only 7-8 steps.  He also gets angry and does not like his wife telling him what to do.  His blood pressures well controlled today it is 132/79.  His fasting sugars have all been good.  Is tolerating aspirin and Plavix well without bruising or bleeding.  He has been using his  CPAP at night.  He has been started on Zoloft 25 mg by his primary care physician for the last 3 to 4 weeks.  Is tolerating well without side effects.  He has not tried increasing the dose.  Update: Today, 01/01/2020, Justin [name] returns for stroke follow-up accompanied by his wife.  Continues to have gait impairment, RLE weakness and moderate to severe cognitive impairment.  Requested discharge from OT today due to lack of progress.  Continues to work with SLP and PT.  Denies new or worsening stroke/TIA symptoms.  PCP recently increased sertraline to 100 mg daily for continued outbursts and depression/anxiety.  Wife reports he is sedentary during the day with minimal ambulation.  She is his primary caregiver.  Remains on Plavix and atorvastatin for secondary stroke prevention without side effects.  Blood pressure today 147/80.  Reports noncompliance with his CPAP since June due to difficulty tolerating mask.  Most recent compliance report that we are able to obtain was from 07/28/2019 -08/26/2019 showing 21 out of 30 usage days for 70% compliance with average usage 8 hours and 18 minutes.  Residual AHI 2.0 on set pressure 11 cm  H2O and C-Flex setting 2.  No further concerns at this time.   Update 06/30/2020 JM: Returns for 63-month stroke follow-up accompanied by his wife who provides majority of history.  Reports possible recurrent stroke last week after he woke up with increased slurred speech and right-sided facial weakness currently still present. Wife also notes decline in his cognition since that time.  Chronic right-sided weakness relatively stable except possible mild worsening ankle dorsiflexion weakness.  Occasional difficulty swallowing thin liquids. She did not seek medical evaluation as she knew he would not be a candidate for emergent intervention and current treatment plan would likely not change.  Also notes continued cognitive decline with frequent outbursts and easily agitated especially towards the  evening.  Sleeps well throughout the night but is not compliant with CPAP. Has since started having a sitter in home over the past 3 weeks for 2 days/week from 9am-430pm.  Previously trialed Namenda but intolerant due to GI side effects.  Continues on sertraline 100 mg daily per PCP for continued behavioral concerns and possible underlying depression/anxiety.  Currently being followed by palliative care to help address advanced care planning and goals  Compliant on Plavix and atorvastatin without associated side effects Blood pressure today elevated but per wife, increased agitation while waiting in lobby for todays visit -monitors at home and typically stable  No further concerns at this time  Update 08/19/2020 JM: Returns for sooner scheduled visit for hospital follow-up accompanied by his wife.  At prior visit, discussed concern of 1 weeks increased slurred speech and right facial weakness as well as worsening cognition.  Did not seek medical attention at that time nor interested in further evaluation (see above).  Due to worsening, presented to [facility] ED on 07/04/2020 for worsening slurred speech and right-sided weakness with stroke work-up revealing left CR infarct likely due to small vessel disease source.  MRA showed progressive moderate/severe stenosis at the origin of both the superior and inferior division left M2 MCA vessels as well as progressive moderate/severe stenosis within the A2/A3 anterior cerebral arteries bilaterally.  Carotid Doppler unremarkable.  2D echo EF 60 to 65%.  Recommended DAPT for 3 months then Plavix alone given intracranial stenosis.  A1c 6.8.  LDL 87.  Discharge to SNF on 07/09/2020  Currently, he has since returned back home with his wife after 20 days at [facility]. Currently working with PT/OT/SLP at home. Wife reports limited progress with continued right-sided weakness, dysarthria and cognitive impairment with behaviors.  Behaviors are worse in the evening and can become quickly  agitated.  He has remained on Depakote but has not yet discontinued sertraline per wife.  He is currently nonambulatory but is able to stand/pivot with assistance.  Was getting aide previously but currently awaiting replacement.  Sleeping well throughout the night but continues to refuse CPAP mask. Being followed by Palliative care.  Denies new stroke/TIA symptoms.  Compliant on aspirin and Plavix as well as atorvastatin without associated side effects.  Blood pressure today 137/83.  Monitors at home and typically stable.  No further concerns at this time.    ROS:   N/A d/t cognition  PMH:  Past Medical History:  Diagnosis Date   Brain bleed ([place])    Cancer ([place])    prostate   [facility] (closed head injury) 04/2013   with [facility]   Depression    Diabetes mellitus without complication ([place])    High cholesterol    Hypertension    Stroke ([place])  PSH:  Past Surgical History:  Procedure Laterality Date   arm & hand surgery Left 1972   skill saw accident   COLON SURGERY     partial   EYE SURGERY     lens implant and cateract- Left   PROSTATECTOMY      Social History:  Social History   Socioeconomic History   Marital status: Married    Spouse name: Not on file   Number of children: 1   Years of education: Not on file   Highest education level: High school graduate  Occupational History   Not on file  Tobacco Use   Smoking status: Never   Smokeless tobacco: Never  Vaping Use   Vaping Use: Never used  Substance and Sexual Activity   Alcohol use: Never   Drug use: Never   Sexual activity: Not Currently  Other Topics Concern   Not on file  Social History Narrative   Lives at home with his wife   Right handed   Caffeine: soft drinks 2-3 cups daily   Social Determinants of Health   Financial Resource Strain: Not on file  Food Insecurity: Not on file  Transportation Needs: Not on file  Physical Activity: Not on file  Stress: Not on file  Social Connections: Not on file   Intimate Partner Violence: Not on file    Family History:  Family History  Problem Relation Age of Onset   CAD Mother    Diabetes Mother    Emphysema Father    Diabetes Sister    Diabetes Brother    Diabetes Sister    Diabetes Brother    Diabetes Mellitus II Neg Hx     Medications:   Current Outpatient Medications on File Prior to Visit  Medication Sig Dispense Refill   acetaminophen (TYLENOL) 500 MG tablet Take 500 mg by mouth every 6 (six) hours as needed for moderate pain.     amLODipine (NORVASC) 5 MG tablet Take 1 tablet (5 mg total) by mouth daily. 30 tablet 0   aspirin EC 325 MG EC tablet Take 1 tablet (325 mg total) by mouth daily. Take for [redacted] weeks along with Plavix and then stop. 30 tablet 0   atorvastatin (LIPITOR) 80 MG tablet Take 1 tablet (80 mg total) by mouth daily. 90 tablet 0   Cholecalciferol (VITAMIN D-1000 MAX ST) 25 MCG (1000 UT) tablet Take 1 tablet (1,000 Units total) by mouth daily. 30 tablet 0   clopidogrel (PLAVIX) 75 MG tablet Take 1 tablet (75 mg total) by mouth daily. 90 tablet 4   cyanocobalamin ([employer] VITAMIN B12) 1000 MCG tablet Take 1 tablet (1,000 mcg total) by mouth daily. 30 tablet 0   fenofibrate 160 MG tablet Take 1 tablet (160 mg total) by mouth daily. 30 tablet 33   glipiZIDE (GLUCOTROL) 5 MG tablet Take 0.5 tablets (2.5 mg total) by mouth daily before breakfast. 30 tablet 0   metFORMIN (GLUCOPHAGE) 500 MG tablet Take 1 tablet (500 mg total) by mouth 2 (two) times daily with a meal. 60 tablet 0   Omega-3 Fatty Acids (FISH OIL) 1000 MG CAPS Take 1,000 mg by mouth daily with breakfast.     polyvinyl alcohol (LIQUIFILM TEARS) 1.4 % ophthalmic solution Place 1 drop into both eyes 4 (four) times daily as needed for dry eyes.     Semaglutide,0.25 or 0.5MG /DOS, 2 MG/1.5ML SOPN Inject 0.25 mg into the skin once a week.     senna-docusate (SENOKOT-S) 8.6-50 MG tablet Take  2 tablets by mouth at bedtime. (Patient taking differently: Take 1 tablet by mouth  at bedtime.)     No current facility-administered medications on file prior to visit.    Allergies:  No Known Allergies   Physical Exam  Vitals:   08/19/20 1253  BP: 137/83  Pulse: 74    There is no height or weight on file to calculate BMI. No results found.  General: well developed, well nourished,  pleasant elderly Caucasian male, seated, in no evident distress Head: head normocephalic and atraumatic.   Neck: supple with no carotid or supraclavicular bruits Cardiovascular: regular rate and rhythm, no murmurs Musculoskeletal: no deformity Skin:  no rash/petichiae Vascular:  Normal pulses all extremities   Neurologic Exam Mental Status: Awake and fully alert.   Moderate to severe dysarthria with moderate difficulty understanding.  Able to follow simple step commands without difficulty.  Oriented to place and time.  Recent and remote memory diminished. Attention span, concentration and fund of knowledge diminished with wife providing majority of history. Mood and affect appropriate and cooperative with exam.  Cranial Nerves: Pupils equal, briskly reactive to light. Extraocular movements full without nystagmus. Visual fields full to confrontation.  HOH bilaterally. Facial sensation intact. Right lower facial weakness. tongue, palate moves normally and symmetrically.  Motor: Normal strength, bulk and tone left upper and lower extremity.  RUE: 4+/5 greater distally with decreased hand dexterity RLE: 3/5 hip flexor and 4/5 ankle dorsiflexion otherwise 5/5 Sensory.: intact to touch , pinprick , position and vibratory sensation.  Coordination: Rapid alternating movements normal in all extremities except slightly decreased right hand. Finger-to-nose performed accurately bilaterally and heel-to-shin difficulty performing.  Orbits left normal right arm.  Mild action tremor noted in BUE with outstretched arms Gait and Station: Deferred as currently not ambulating Reflexes: 1+ and symmetric.  Toes downgoing.       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  Residual deficits of right-sided weakness, dysarthria, and cognitive impairment with behaviors with worsening in setting of recent recurrent stroke    PLAN:  Multiple recurrent strokes:  Recommend continued participation with therapies but in setting of multiple recurrent strokes, difficult to determine how much improvement there will be which was discussed with wife. Continue aspirin 325 mg daily and clopidogrel 75 mg daily  and atorvastatin for secondary stroke prevention.  Advised to continue aspirin until mid August as 3 months DAPT completed at that time Recent LDL 24 on atorvastatin 80 mg daily Recent A1c 6.8 monitored by PCP Discussed secondary stroke prevention measures and importance of close PCP follow-up for aggressive stroke risk factor management with HTN with BP goal<130/90, HLD with LDL goal<70 and DM with A1c goal<7 Vascular cognitive impairment with behaviors:  MMSE unable to be obtained -recent worsening likely in setting of recent recurrent stroke Advised to stop Depakote due to possible side effects of tremors which were not previously observed Start Seroquel 12.5 mg nightly for behavioral concerns (Qtc WNL) Restart sertraline 100 mg nightly for underlying depression/anxiety  Intolerant to Namenda d/t GI side effects -possibly consider Aricept in the future Information provided regarding [facility] day programs, home care and memory care resources provided at prior visit -encouraged wife to contact for further assistance OSA with CPAP noncompliance:  Continued  noncompliance with difficulty tolerating.  In setting of his cognitive impairment,  mask refitting or other possible treatment options not recommended or appropriate at this time    Follow-up in 6 months or call earlier if needed   CC:  [facility] provider: Dr. Lloyd Huger, Rebeca Alert, MD    I spent 46 minutes of face-to-face and non-face-to-face time with patient and wife.  This included previsit chart review including review of recent hospitalization, prolonged conversation regarding recent additional stroke, educated on secondary stroke prevention measures and importance of continued aggressive stroke risk factor management, continued decline of cognitive impairment with behaviors and further interventions, known OSA with CPAP noncompliance and answered all other questions to patient and wifes satisfaction   Frann Rider, AGNP-BC  [facility] [address] [place], [place] [address]  Phone [phone] Fax [phone] Note: This document was prepared with digital dictation and possible smart phrase technology. Any transcriptional errors that result from this process are unintentional.    I reviewed the above note and documentation by the Nurse Practitioner and agree with the history, exam, assessment and plan as outlined above. I was available for consultation. Star Age, MD, PhD [facility])

## 2020-08-21 ENCOUNTER — Telehealth: Payer: Self-pay | Admitting: Adult Health

## 2020-08-21 NOTE — Telephone Encounter (Signed)
[name], Jeury Mcnab (on [place]) called, he is nostalgic, did nothing yesterday, at 12:30p PT came in help me put him in the shower. He just sit looking out the window; its like he is in a far away place. Would like a call from the nurse.  Ms.  Sison stated, do not want to take him to ER.

## 2020-08-21 NOTE — Telephone Encounter (Signed)
I called wife.   He is about same,  although is responding to her when she speaks to him.  I read her JM/NP message.  She verbalized understanding.  Appreciated call back and will let us know if any other changes.

## 2020-08-21 NOTE — Telephone Encounter (Signed)
It is difficult to tell if this could be due to his dementia and fluctuation or possibly seizure-like activity as he is at increased risk of seizures with his prior strokes.  Although mental status changes could be a sign of additional stroke, I have a lower suspicion.  It could also be in setting of metabolic changes or infection such as UTI.  I would recommend that she continue to monitor him and to let me know if he continues to experience fluctuation in mental status or any other associated symptoms.  As it is always recommended for emergent evaluation in the ED with any acute changes, myself and Mrs. Hettich have had extensive conversations regarding bringing him to the ED which she wishes to avoid if able

## 2020-08-21 NOTE — Telephone Encounter (Signed)
I called wife of pt.  She said she relating change of his behavior, it was acute/sudden change , he is lethargic, staring out the window, not responding.  I relayed that due to sudden change would call 911, ED.  She said she was relating change as JM/NP relayed to do.  She is not taking him to ED, he has had 7 strokes.  I would relay to JM/NP.

## 2020-08-22 NOTE — Progress Notes (Signed)
I agree with the above plan 

## 2020-09-16 ENCOUNTER — Other Ambulatory Visit: Payer: Self-pay | Admitting: Nurse Practitioner

## 2020-11-20 ENCOUNTER — Ambulatory Visit: Payer: Medicare HMO | Admitting: Adult Health

## 2021-01-05 ENCOUNTER — Ambulatory Visit: Payer: Medicare HMO | Admitting: Adult Health

## 2021-02-26 ENCOUNTER — Other Ambulatory Visit: Payer: Self-pay | Admitting: Adult Health

## 2021-03-28 ENCOUNTER — Other Ambulatory Visit: Payer: Self-pay | Admitting: Adult Health

## 2021-05-23 DEATH — deceased

## 2021-06-22 DEATH — deceased
